# Patient Record
Sex: Male | Born: 1955 | Race: Black or African American | Hispanic: No | Marital: Married | Smoking: Never smoker
Health system: Southern US, Community
[De-identification: ages and names within clinical notes are randomized; demographics above are authoritative.]

## PROBLEM LIST (undated history)

## (undated) ENCOUNTER — Emergency Department (HOSPITAL_COMMUNITY): Admission: EM | Payer: Self-pay | Source: Home / Self Care

## (undated) DIAGNOSIS — A419 Sepsis, unspecified organism: Secondary | ICD-10-CM

## (undated) DIAGNOSIS — N390 Urinary tract infection, site not specified: Secondary | ICD-10-CM

## (undated) DIAGNOSIS — J9600 Acute respiratory failure, unspecified whether with hypoxia or hypercapnia: Secondary | ICD-10-CM

## (undated) DIAGNOSIS — E119 Type 2 diabetes mellitus without complications: Secondary | ICD-10-CM

## (undated) DIAGNOSIS — I639 Cerebral infarction, unspecified: Secondary | ICD-10-CM

## (undated) DIAGNOSIS — N179 Acute kidney failure, unspecified: Secondary | ICD-10-CM

## (undated) DIAGNOSIS — J189 Pneumonia, unspecified organism: Secondary | ICD-10-CM

## (undated) DIAGNOSIS — R6521 Severe sepsis with septic shock: Secondary | ICD-10-CM

## (undated) DIAGNOSIS — D638 Anemia in other chronic diseases classified elsewhere: Secondary | ICD-10-CM

## (undated) DIAGNOSIS — Z931 Gastrostomy status: Secondary | ICD-10-CM

## (undated) HISTORY — DX: Gastrostomy status: Z93.1

## (undated) HISTORY — DX: Urinary tract infection, site not specified: N39.0

## (undated) HISTORY — DX: Anemia in other chronic diseases classified elsewhere: D63.8

## (undated) HISTORY — PX: CHOLECYSTECTOMY: SHX55

## (undated) HISTORY — DX: Acute respiratory failure, unspecified whether with hypoxia or hypercapnia: J96.00

## (undated) HISTORY — DX: Sepsis, unspecified organism: A41.9

---

## 1998-06-21 ENCOUNTER — Ambulatory Visit (HOSPITAL_COMMUNITY): Admission: RE | Admit: 1998-06-21 | Discharge: 1998-06-21 | Payer: Self-pay | Admitting: *Deleted

## 1998-11-15 ENCOUNTER — Emergency Department (HOSPITAL_COMMUNITY): Admission: EM | Admit: 1998-11-15 | Discharge: 1998-11-15 | Payer: Self-pay | Admitting: Emergency Medicine

## 1999-04-23 ENCOUNTER — Encounter: Payer: Self-pay | Admitting: Sports Medicine

## 1999-04-23 ENCOUNTER — Ambulatory Visit (HOSPITAL_COMMUNITY): Admission: RE | Admit: 1999-04-23 | Discharge: 1999-04-23 | Payer: Self-pay | Admitting: Sports Medicine

## 1999-07-16 ENCOUNTER — Encounter: Payer: Self-pay | Admitting: Neurosurgery

## 1999-07-16 ENCOUNTER — Ambulatory Visit (HOSPITAL_COMMUNITY): Admission: RE | Admit: 1999-07-16 | Discharge: 1999-07-16 | Payer: Self-pay | Admitting: Neurosurgery

## 1999-07-31 ENCOUNTER — Ambulatory Visit (HOSPITAL_COMMUNITY): Admission: RE | Admit: 1999-07-31 | Discharge: 1999-07-31 | Payer: Self-pay | Admitting: Neurosurgery

## 1999-07-31 ENCOUNTER — Encounter: Payer: Self-pay | Admitting: Neurosurgery

## 1999-08-14 ENCOUNTER — Ambulatory Visit (HOSPITAL_COMMUNITY): Admission: RE | Admit: 1999-08-14 | Discharge: 1999-08-14 | Payer: Self-pay | Admitting: Neurosurgery

## 1999-08-14 ENCOUNTER — Encounter: Payer: Self-pay | Admitting: Neurosurgery

## 1999-09-12 ENCOUNTER — Ambulatory Visit (HOSPITAL_COMMUNITY): Admission: RE | Admit: 1999-09-12 | Discharge: 1999-09-12 | Payer: Self-pay | Admitting: *Deleted

## 1999-09-12 ENCOUNTER — Encounter (INDEPENDENT_AMBULATORY_CARE_PROVIDER_SITE_OTHER): Payer: Self-pay | Admitting: Specialist

## 1999-10-01 ENCOUNTER — Ambulatory Visit: Admission: RE | Admit: 1999-10-01 | Discharge: 1999-10-01 | Payer: Self-pay | Admitting: Otolaryngology

## 2001-12-08 ENCOUNTER — Encounter (INDEPENDENT_AMBULATORY_CARE_PROVIDER_SITE_OTHER): Payer: Self-pay | Admitting: Specialist

## 2001-12-08 ENCOUNTER — Ambulatory Visit (HOSPITAL_COMMUNITY): Admission: RE | Admit: 2001-12-08 | Discharge: 2001-12-08 | Payer: Self-pay | Admitting: *Deleted

## 2002-02-28 ENCOUNTER — Ambulatory Visit (HOSPITAL_COMMUNITY): Admission: RE | Admit: 2002-02-28 | Discharge: 2002-02-28 | Payer: Self-pay | Admitting: Endocrinology

## 2002-02-28 ENCOUNTER — Encounter: Payer: Self-pay | Admitting: Endocrinology

## 2002-04-08 ENCOUNTER — Emergency Department (HOSPITAL_COMMUNITY): Admission: EM | Admit: 2002-04-08 | Discharge: 2002-04-08 | Payer: Self-pay | Admitting: Emergency Medicine

## 2002-04-08 ENCOUNTER — Encounter: Payer: Self-pay | Admitting: Emergency Medicine

## 2002-06-05 ENCOUNTER — Encounter: Admission: RE | Admit: 2002-06-05 | Discharge: 2002-06-05 | Payer: Self-pay | Admitting: Sports Medicine

## 2002-06-05 ENCOUNTER — Encounter: Payer: Self-pay | Admitting: Sports Medicine

## 2002-06-13 ENCOUNTER — Encounter: Payer: Self-pay | Admitting: Surgery

## 2002-06-21 ENCOUNTER — Observation Stay (HOSPITAL_COMMUNITY): Admission: RE | Admit: 2002-06-21 | Discharge: 2002-06-22 | Payer: Self-pay | Admitting: Surgery

## 2002-06-21 ENCOUNTER — Encounter (INDEPENDENT_AMBULATORY_CARE_PROVIDER_SITE_OTHER): Payer: Self-pay | Admitting: Specialist

## 2003-02-22 ENCOUNTER — Ambulatory Visit (HOSPITAL_COMMUNITY): Admission: RE | Admit: 2003-02-22 | Discharge: 2003-02-22 | Payer: Self-pay | Admitting: *Deleted

## 2003-10-23 ENCOUNTER — Encounter: Admission: RE | Admit: 2003-10-23 | Discharge: 2004-01-21 | Payer: Self-pay | Admitting: Endocrinology

## 2004-03-11 ENCOUNTER — Encounter: Admission: RE | Admit: 2004-03-11 | Discharge: 2004-06-09 | Payer: Self-pay | Admitting: Endocrinology

## 2004-10-03 ENCOUNTER — Emergency Department (HOSPITAL_COMMUNITY): Admission: EM | Admit: 2004-10-03 | Discharge: 2004-10-03 | Payer: Self-pay | Admitting: Family Medicine

## 2005-04-27 ENCOUNTER — Ambulatory Visit (HOSPITAL_COMMUNITY): Admission: RE | Admit: 2005-04-27 | Discharge: 2005-04-27 | Payer: Self-pay | Admitting: *Deleted

## 2008-01-20 ENCOUNTER — Emergency Department (HOSPITAL_COMMUNITY): Admission: EM | Admit: 2008-01-20 | Discharge: 2008-01-20 | Payer: Self-pay | Admitting: Emergency Medicine

## 2009-01-23 ENCOUNTER — Ambulatory Visit (HOSPITAL_COMMUNITY): Admission: RE | Admit: 2009-01-23 | Discharge: 2009-01-23 | Payer: Self-pay | Admitting: *Deleted

## 2011-01-28 LAB — GLUCOSE, CAPILLARY: Glucose-Capillary: 184 mg/dL — ABNORMAL HIGH (ref 70–99)

## 2011-03-03 NOTE — Op Note (Signed)
NAMETHEON, SOBOTKA              ACCOUNT NO.:  [id number]   MEDICAL RECORD NO.:  [id number]          PATIENT TYPE:  AMB   LOCATION:  ENDO                         FACILITY:  [facility]   PHYSICIAN:  Georgiana Spinner, M.D.    DATE OF BIRTH:  1956/08/14   DATE OF PROCEDURE:  DATE OF DISCHARGE:                               OPERATIVE REPORT   PROCEDURE:  Colonoscopy.   INDICATIONS:  Hemoccult positivity.   ANESTHESIA:  Fentanyl 75 mcg, Versed 7.5 mg.   PROCEDURE:  With the patient mildly sedated in the left lateral  decubitus position, the Pentax videoscopic colonoscope was inserted in  the rectum after normal rectal exam and passed under direct vision with  pressure applied to reach the cecum, identified by the ileocecal valve  and the base of the cecum, both of which were photographed.  From this  point the colonoscope was slowly withdrawn, taking circumferential views  of the colonic mucosa, stopping only in the rectum, which appeared  normal on direct and showed hemorrhoids on retroflexed view.  The  endoscope was straightened and withdrawn.  The patient's vital signs,  pulse oximeter remained stable.  The patient tolerated the procedure  well without apparent complications.   FINDINGS:  A rather unremarkable examination with possibly small  internal hemorrhoids noted.   PLAN:  Will have patient follow up with me as an outpatient to determine  if further evaluation is necessary.           ______________________________  Georgiana Spinner, M.D.     GMO/MEDQ  D:  01/23/2009  T:  01/23/2009  Job:  [id number]

## 2011-03-06 NOTE — Op Note (Signed)
   Michael Bright, Michael Bright                          ACCOUNT NO.:  [id number]   MEDICAL RECORD NO.:  [id number]                   PATIENT TYPE:  AMB   LOCATION:  ENDO                                 FACILITY:  [facility]   PHYSICIAN:  Georgiana Spinner, M.D.                 DATE OF BIRTH:  07-31-1956   DATE OF PROCEDURE:  02/22/2003  DATE OF DISCHARGE:                                 OPERATIVE REPORT   PROCEDURE PERFORMED:  Colonoscopy.   ENDOSCOPIST:  Georgiana Spinner, M.D.   INDICATIONS FOR PROCEDURE:  Rectal bleeding.   ANESTHESIA:  Demerol 25 mg, Versed 2 mg, Phenergan 12.5 mg extra.   DESCRIPTION OF PROCEDURE:  With the patient mildly sedated in the left  lateral decubitus position, the Olympus video colonoscope was inserted in  the rectum and passed under direct vision to the cecum, identified by the  ileocecal valve and appendiceal orifice, both of which were photographed.  From this point the colonoscope was slowly withdrawn taking circumferential  views of the entire colonic mucosa stopping only in the rectum which  appeared normal on direct and retroflex view.  The endoscope was  straightened and withdrawn.  The patient's vital signs and pulse oximeter  remained stable.  The patient tolerated the procedure well without apparent  complications.   FINDINGS:  Unremarkable colonoscopic examination.   PLAN:  Have patient follow up with me as an outpatient.                                                 Georgiana Spinner, M.D.    GMO/MEDQ  D:  02/22/2003  T:  02/22/2003  Job:  [id number]

## 2011-03-06 NOTE — Op Note (Signed)
NAMECIRILO, CANNER              ACCOUNT NO.:  [id number]   MEDICAL RECORD NO.:  [id number]          PATIENT TYPE:  AMB   LOCATION:  ENDO                         FACILITY:  [facility]   PHYSICIAN:  Georgiana Spinner, M.D.    DATE OF BIRTH:  12-17-1955   DATE OF PROCEDURE:  DATE OF DISCHARGE:                                 OPERATIVE REPORT   PROCEDURE:  Colonoscopy.   ANESTHESIA:  Demerol 20, Versed 1 mg.   INDICATIONS:  Hemoccult positivity.   With patient mildly sedated in the left lateral decubitus position, the  Olympus videoscopic adjustable colonoscope was inserted in the rectum and  passed under direct vision rather easily to the cecum, identified by crow's  foot of the cecum and ileocecal valve, both of which were photographed.  From this point, the colonoscope was slowly withdrawn, taking  circumferential views of colonic mucosa, stopping in the rectum which  appeared normal on direct and retroflexed view.  Pull through of the anal  canal shows some excoriation of the peroneal mucosa, but was otherwise  unremarkable. The patient's vital signs and pulse oximeter remained stable.  The patient tolerated procedure well without apparent complications.   FINDINGS:  Mild excoriation of the peroneal mucosa.  Otherwise unremarkable  examination.   PLAN:  Have the patient follow-up with me as an outpatient.       GMO/MEDQ  D:  04/27/2005  T:  04/27/2005  Job:  [id number]

## 2011-03-06 NOTE — Procedures (Signed)
[facility]  Patient:    Michael Bright, Michael Bright Visit Number: [id number] MRN: [id number]          Service Type: Attending:  Sabino Gasser, M.D. Dictated by:   Sabino Gasser, M.D. Proc. Date: 12/08/01                             Procedure Report  PROCEDURE:  Upper endoscopy.  INDICATIONS:  Gastroesophageal reflux disease, Barretts esophagus previously noted.  ANESTHESIA:  Demerol 80, Versed 8 mg.  DESCRIPTION OF PROCEDURE:  With patient mildly sedated in the left lateral decubitus position, the Olympus videoscopic endoscope was inserted into the mouth and passed under direct vision through the esophagus.  The distal esophagus was approached and showed what appeared to be short segments of Barretts esophagus.  This was photographed and biopsied.  We entered into the stomach.  The  fundus, body, antrum, duodenal bulb, and second portion of duodenum were well-visualized.  From this point, the endoscope was slowly withdrawn, taking circumferential views of the entire duodenal mucosa until the endoscope then pulled back into the stomach and placed in retroflexion to view the stomach from below.  The endoscope was then straightened and withdrawn, taking circumferential views of the remaining gastric and esophageal mucosa, stopping in the antrum and of the stomach where diffuse erythema was seen, photographed and biopsied.  The endoscope was withdrawn. The patients vital signs and pulse oximeter remained stable.  The patient tolerated the procedure well without apparent complications.  FINDINGS: 1. Changes of probable Barretts esophagus, photographed and biopsied. 2. Changes of erythema in the stomach consistent with a possible gastritis.  PLAN:  Await biopsy report.  The patient will call me for results and follow up with me as an outpatient. Dictated by:   Sabino Gasser, M.D. Attending:  Sabino Gasser, M.D. DD:  12/08/01 TD:  12/08/01 Job: [id number]

## 2011-03-06 NOTE — Op Note (Signed)
Michael Bright, Michael Bright                         ACCOUNT NO.:  [id number]   MEDICAL RECORD NO.:  [id number]                   PATIENT TYPE:  OBV   LOCATION:  0159                                 FACILITY:  [facility]   PHYSICIAN:  Currie Paris, M.D.           DATE OF BIRTH:  August 11, 1956   DATE OF PROCEDURE:  06/21/2002  DATE OF DISCHARGE:                                 OPERATIVE REPORT   CCS#:  [id number]   PREOPERATIVE DIAGNOSES:  Chronic calculus cholecystitis.   POSTOPERATIVE DIAGNOSES:  Chronic calculus cholecystitis.   OPERATION:  Laparoscopic cholecystectomy.   SURGEON:  Currie Paris, M.D.   ASSISTANT:  Gita Kudo, M.D.   ANESTHESIA:  General endotracheal.   CLINICAL HISTORY:  This patient is a 55 year old morbidly obese gentleman  who is otherwise in reasonably good health who has what appears to be  multiple small tiny gallstones and biliary type symptoms. After discussion  with this patient, he elected to proceed to cholecystectomy.   DESCRIPTION OF PROCEDURE:  The patient was seen in the holding area and had  no further questions about his surgery. He was taken to the operating room  and after satisfactory general endotracheal anesthesia had been obtained,  the abdomen was prepped and draped. 0.25% plain Marcaine was used for each  incision. An umbilical incision was made just above the umbilicus and the  fascia identified, opened and the peritoneal cavity entered under direct  vision. A pursestring suture was placed and the Hasson introduced. The  patient was placed in reverse Trendelenburg and tilted a little to the left  and two additional cannulas placed in the usual position. The gallbladder  was tensely distended, we could not really grab it so it was aspirated with  a needle aspirator and decompressed. I put some clips on the opening to try  to keep bile from leaking during the remainder of the case but we did have  some leakage initially which we  were able to suction out.   I had to use a 30 degree scope because the patient's morbid obesity made  visualization around the triangle of Calot difficult but I was able to  identify the peritoneum in the triangle of Calot and the lymph node that is  usually associated with the cystic duct and cystic artery. I opened the  peritoneum on both sides of the triangle of Calot and was able to dissect  out and identify a window and dissect out so I could see the posterior  branch of the artery which was small and clipped and divided. I had the  cystic artery and cystic duct well dissected out but the cystic duct I could  not really get good traction on and I was afraid to do a cholangiogram for  fear that if we divided it would retract into an area where I would not be  able to see it. Since I had the  anatomy clearly identified and since his  liver function is normal preoperatively, I elected not to do a  cholangiogram.   I put three clips on the stay side and one on the go side of both the cystic  artery and cystic duct and divided each. The gallbladder was removed from  below to above and additional small clip placed on some fatty tissue but  that kind of looked like it might have a little lymphatic in it. The  gallbladder was removed from below to above and just prior to disconnecting,  we irrigated and made sure everything was dry. The gallbladder was  disconnected and put in a bad. Final irrigation and check for hemostasis was  made and again everything appeared okay.   The gallbladder was brought out the umbilical port. The abdomen was  reinsufflated and we suctioned out the remaining irrigant and removed the  lateral ports. They appeared to be dry. The umbilical port was removed and  the pursestring tied down. The abdomen was deflated through the epigastric  port and it was removed. The skin was closed with staples as my usual  Monocryl suture did not get good approximation because of a  fair amount of  skin tension from his obesity.   The patient tolerated the procedure well and there were no operative  complications. All counts were correct.                                               Currie Paris, M.D.    CJS/MEDQ  D:  06/21/2002  T:  06/21/2002  Job:  [id number]   cc:   Brooke Bonito, M.D.  [address]  [place]  [place] [address]  Fax: [phone]

## 2011-03-06 NOTE — Op Note (Signed)
Michael Bright, Michael Bright              ACCOUNT NO.:  [id number]   MEDICAL RECORD NO.:  [id number]          PATIENT TYPE:  AMB   LOCATION:  ENDO                         FACILITY:  [facility]   PHYSICIAN:  Georgiana Spinner, M.D.    DATE OF BIRTH:  [birth year]   DATE OF PROCEDURE:  04/27/2005  DATE OF DISCHARGE:                                 OPERATIVE REPORT   PROCEDURE:  Upper endoscopy.   INDICATIONS:  Hemoccult positivity.   ANESTHESIA:  1.  Demerol 60.  2.  Versed 5 mg.   PROCEDURE:  With patient mildly sedated in the left lateral decubitus  position, the Olympus videoscopic endoscope was inserted in the mouth,  passed under direct vision through the esophagus, which appeared normal,  into the stomach, fundus, body and antrum.  The duodenal bulb, second  portion of duodenum all appeared normal.  From this point, the endoscope was  slowly withdrawn, taking circumferential views of duodenal mucosa until the  endoscope had been pulled back and the stomach placed in retroflexion to  view the stomach from below.  The endoscope was straightened and withdrawn,  taking circumferential views of the remaining gastric and esophageal mucosa.  The patient's vital signs, pulse oximeter remained stable.  The patient  tolerated the procedure well with no apparent complications.   FINDINGS:  Unremarkable examination.  Proceed to colonoscopy.       GMO/MEDQ  D:  04/27/2005  T:  04/27/2005  Job:  [id number]

## 2011-03-06 NOTE — Op Note (Signed)
   Michael Bright, Michael Bright                          ACCOUNT NO.:  [id number]   MEDICAL RECORD NO.:  [id number]                   PATIENT TYPE:  AMB   LOCATION:  ENDO                                 FACILITY:  [facility]   PHYSICIAN:  Georgiana Spinner, M.D.                 DATE OF BIRTH:  [birth year]   DATE OF PROCEDURE:  02/22/2003  DATE OF DISCHARGE:                                 OPERATIVE REPORT   PROCEDURE PERFORMED:  Upper endoscopy.   ENDOSCOPIST:  Georgiana Spinner, M.D.   INDICATIONS FOR PROCEDURE:  Gastroesophageal reflux disease.  Question  history of Barrett's esophagus.   ANESTHESIA:  Demerol 75 mg, Versed 8 mg, Phenergan 12.5 mg.   DESCRIPTION OF PROCEDURE:  With the patient mildly sedated in the left  lateral decubitus position, the Olympus video endoscope was inserted in the  mouth and passed under direct vision through the esophagus.  The distal  esophagus was approached and I saw no evidence of Barrett's esophagus.  We  entered into the stomach.  The fundus, body, antrum, duodenal bulb and  second portion of the duodenum all appeared normal.  From this point, the  endoscope was slowly withdrawn taking circumferential views of the duodenal  mucosa until the endoscope was pulled back into the stomach and placed on  retroflexion to view the stomach from below.  The endoscope was then  straightened and withdrawn taking circumferential views of the remaining  gastric and esophageal mucosa.  The patient's vital signs and pulse oximeter  remained stable.  The patient tolerated the procedure well without apparent  complications.   FINDINGS:  Otherwise unremarkable examination other than an incomplete wrap  of the gastroesophageal junction around endoscope.   PLAN:  Repeat examination in possibly one year and look once again for  Barrett's.  Proceed to colonoscopy as planned.                                               Georgiana Spinner, M.D.    GMO/MEDQ  D:  02/22/2003  T:   02/22/2003  Job:  [id number]

## 2011-07-14 LAB — POCT URINALYSIS DIP (DEVICE)
Bilirubin Urine: NEGATIVE
Glucose, UA: >=1000 — AB
Ketones, ur: 40 — AB
Nitrite: NEGATIVE
Operator id: 116391
Protein, ur: 100 — AB
Specific Gravity, Urine: 1.025
Urobilinogen, UA: 0.2
pH: 5.5

## 2011-07-14 LAB — HERPES SIMPLEX VIRUS CULTURE

## 2011-07-14 LAB — GC/CHLAMYDIA PROBE AMP, GENITAL
Chlamydia, DNA Probe: NEGATIVE
GC Probe Amp, Genital: NEGATIVE

## 2011-07-14 LAB — POCT I-STAT, CHEM 8
BUN: 12
Calcium, Ion: 1.09 — ABNORMAL LOW
Chloride: 106
Creatinine, Ser: 1.1
Glucose, Bld: 267 — ABNORMAL HIGH
HCT: 49
Hemoglobin: 16.7
Potassium: 4.1
Sodium: 137
TCO2: 21

## 2014-03-22 ENCOUNTER — Encounter (HOSPITAL_COMMUNITY): Payer: Self-pay | Admitting: Emergency Medicine

## 2014-03-22 ENCOUNTER — Emergency Department (HOSPITAL_COMMUNITY)
Admission: EM | Admit: 2014-03-22 | Discharge: 2014-03-22 | Disposition: A | Discharge status: Home / Self Care | Payer: BC Managed Care – PPO | Source: Home / Self Care | Attending: Emergency Medicine | Admitting: Emergency Medicine

## 2014-03-22 DIAGNOSIS — M79606 Pain in leg, unspecified: Secondary | ICD-10-CM

## 2014-03-22 DIAGNOSIS — M79609 Pain in unspecified limb: Secondary | ICD-10-CM

## 2014-03-22 MED ORDER — CYCLOBENZAPRINE HCL 10 MG PO TABS: 10.0000 mg | ORAL_TABLET | Freq: Three times a day (TID) | ORAL | Status: DC | PRN

## 2014-03-22 MED ORDER — ETODOLAC 500 MG PO TABS: 500.0000 mg | ORAL_TABLET | Freq: Two times a day (BID) | ORAL | Status: DC

## 2014-03-22 NOTE — ED Notes (Signed)
Pt reports making sudden movement when sitting up in bed and felt pain in lower back and down through out left leg.  Incident happened a week ago.  States no getting any better.  No relief with ibuprofen or heating pad

## 2014-03-22 NOTE — ED Provider Notes (Signed)
CSN: [id number]     Arrival date & time 03/22/14  1433 History   None    Chief Complaint  Patient presents with  . Leg Pain   (Consider location/radiation/quality/duration/timing/severity/associated sxs/prior Treatment) HPI Comments: 58 year old male presents complaining of left leg pain from an injury occurring one week ago. He was walking and turned too quickly and felt a pull in the side of his leg. He has pain in the lower lateral calf that radiates up the leg to the side of his hip. He has also had a sensation of numbness in his first and second toes. He says he feels a lump on the side of his left leg or he may have pulled a muscle. He tried putting heat on it but that only made it worse. He has ibuprofen and that has not helped either. The sensation of numbness gets worse when he lays down flat and is relieved by moving his leg. He denies any swelling of the leg or redness. There is no pain at rest. He has no history of DVT or PE. He has no systemic symptoms. He has a history of bulging disc causing pain in the opposite leg, but this feels nothing like that. The pain from the bulging disc was much worse.  Patient is a 58 y.o. male presenting with leg pain.  Leg Pain Associated symptoms: no fever     History reviewed. No pertinent past medical history. History reviewed. No pertinent past surgical history. History reviewed. No pertinent family history. History  Substance Use Topics  . Smoking status: Never Smoker   . Smokeless tobacco: Not on file  . Alcohol Use: Yes    Review of Systems  Constitutional: Negative for fever.  Cardiovascular: Negative for leg swelling.  Musculoskeletal:       Left leg pain  All other systems reviewed and are negative.   Allergies  Review of patient's allergies indicates no known allergies.  Home Medications   Prior to Admission medications   Medication Sig Start Date End Date Taking? Authorizing Provider  cyclobenzaprine (FLEXERIL) 10 MG  tablet Take 1 tablet (10 mg total) by mouth 3 (three) times daily as needed for muscle spasms. 03/22/14   Graylon Good, PA-C  etodolac (LODINE) 500 MG tablet Take 1 tablet (500 mg total) by mouth 2 (two) times daily. 03/22/14   Adrian Blackwater Keshun Berrett, PA-C   BP 147/71  Pulse 85  Temp(Src) 98.2 F (36.8 C) (Oral)  Resp 16  SpO2 96% Physical Exam  Nursing note and vitals reviewed. Constitutional: He is oriented to person, place, and time. He appears well-developed and well-nourished. No distress.  Morbidly obese body habitus  HENT:  Head: Normocephalic.  Cardiovascular:  Pulses:      Dorsalis pedis pulses are 2+ on the right side, and 2+ on the left side.  There is trace pitting edema up to the knees that is equal bilaterally  Pulmonary/Chest: Effort normal. No respiratory distress.  Musculoskeletal:       Left knee: Normal.       Left ankle: Normal.       Left upper leg: Normal.       Left lower leg: Normal.       Left foot: Normal.  Neurological: He is alert and oriented to person, place, and time. He has normal strength. No sensory deficit. He exhibits normal muscle tone. Coordination and gait normal.  Babinski's is downgoing  Skin: Skin is warm and dry. No rash noted. He is  not diaphoretic.  Psychiatric: He has a normal mood and affect. Judgment normal.    ED Course  Procedures (including critical care time) Labs Review Labs Reviewed - No data to display  Imaging Review No results found.   MDM   1. Leg pain    The exam is entirely normal. Likely a muscle strain. There is no unilateral edema, swelling, or redness to suggest DVT. There is no redness or swelling to suggest cellulitis. Sensation is equal bilaterally and completely intact. DTRs are equal bilaterally and normal. We'll treat with NSAIDs and with muscle relaxer. Followup as needed.   Meds ordered this encounter  Medications  . cyclobenzaprine (FLEXERIL) 10 MG tablet    Sig: Take 1 tablet (10 mg total) by mouth 3  (three) times daily as needed for muscle spasms.    Dispense:  20 tablet    Refill:  0    Order Specific Question:  Supervising Provider    Answer:  Linna HoffKINDL, JAMES D [phone][[id number]]  . etodolac (LODINE) 500 MG tablet    Sig: Take 1 tablet (500 mg total) by mouth 2 (two) times daily.    Dispense:  30 tablet    Refill:  1    Order Specific Question:  Supervising Provider    Answer:  Bradd CanaryKINDL, JAMES D [[id number]]       Graylon GoodZachary H Khamila Bassinger, PA-C 03/22/14 [phone]

## 2014-03-22 NOTE — Discharge Instructions (Signed)
Musculoskeletal Pain °Musculoskeletal pain is muscle and boney aches and pains. These pains can occur in any part of the body. Your caregiver may treat you without knowing the cause of the pain. They may treat you if blood or urine tests, X-rays, and other tests were normal.  °CAUSES °There is often not a definite cause or reason for these pains. These pains may be caused by a type of germ (virus). The discomfort may also come from overuse. Overuse includes working out too hard when your body is not fit. Boney aches also come from weather changes. Bone is sensitive to atmospheric pressure changes. °HOME CARE INSTRUCTIONS  °· Ask when your test results will be ready. Make sure you get your test results. °· Only take over-the-counter or prescription medicines for pain, discomfort, or fever as directed by your caregiver. If you were given medications for your condition, do not drive, operate machinery or power tools, or sign legal documents for 24 hours. Do not drink alcohol. Do not take sleeping pills or other medications that may interfere with treatment. °· Continue all activities unless the activities cause more pain. When the pain lessens, slowly resume normal activities. Gradually increase the intensity and duration of the activities or exercise. °· During periods of severe pain, bed rest may be helpful. Lay or sit in any position that is comfortable. °· Putting ice on the injured area. °· Put ice in a bag. °· Place a towel between your skin and the bag. °· Leave the ice on for 15 to 20 minutes, 3 to 4 times a day. °· Follow up with your caregiver for continued problems and no reason can be found for the pain. If the pain becomes worse or does not go away, it may be necessary to repeat tests or do additional testing. Your caregiver may need to look further for a possible cause. °SEEK IMMEDIATE MEDICAL CARE IF: °· You have pain that is getting worse and is not relieved by medications. °· You develop chest pain  that is associated with shortness or breath, sweating, feeling sick to your stomach (nauseous), or throw up (vomit). °· Your pain becomes localized to the abdomen. °· You develop any new symptoms that seem different or that concern you. °MAKE SURE YOU:  °· Understand these instructions. °· Will watch your condition. °· Will get help right away if you are not doing well or get worse. °Document Released: 10/05/2005 Document Revised: 12/28/2011 Document Reviewed: 06/09/2013 °[employer]® Patient Information ©2014 [employer]. ° °

## 2014-03-23 NOTE — ED Provider Notes (Signed)
Medical screening examination/treatment/procedure(s) were performed by non-physician practitioner and as supervising physician I was immediately available for consultation/collaboration.  Delena Casebeer, M.D.  Mistee Soliman C Lafern Brinkley, MD 03/23/14 2217 

## 2014-11-16 ENCOUNTER — Encounter: Payer: Self-pay | Admitting: Internal Medicine

## 2019-05-29 ENCOUNTER — Encounter (HOSPITAL_COMMUNITY): Payer: Self-pay | Admitting: Emergency Medicine

## 2019-05-29 ENCOUNTER — Other Ambulatory Visit: Payer: Self-pay

## 2019-05-29 DIAGNOSIS — R29704 NIHSS score 4: Secondary | ICD-10-CM | POA: Diagnosis present

## 2019-05-29 DIAGNOSIS — R29810 Facial weakness: Secondary | ICD-10-CM | POA: Diagnosis present

## 2019-05-29 DIAGNOSIS — I6381 Other cerebral infarction due to occlusion or stenosis of small artery: Secondary | ICD-10-CM | POA: Diagnosis not present

## 2019-05-29 DIAGNOSIS — R443 Hallucinations, unspecified: Secondary | ICD-10-CM | POA: Diagnosis not present

## 2019-05-29 DIAGNOSIS — H532 Diplopia: Secondary | ICD-10-CM | POA: Diagnosis not present

## 2019-05-29 DIAGNOSIS — G9349 Other encephalopathy: Secondary | ICD-10-CM | POA: Diagnosis not present

## 2019-05-29 DIAGNOSIS — G4733 Obstructive sleep apnea (adult) (pediatric): Secondary | ICD-10-CM | POA: Diagnosis present

## 2019-05-29 DIAGNOSIS — H5500 Unspecified nystagmus: Secondary | ICD-10-CM | POA: Diagnosis present

## 2019-05-29 DIAGNOSIS — I129 Hypertensive chronic kidney disease with stage 1 through stage 4 chronic kidney disease, or unspecified chronic kidney disease: Secondary | ICD-10-CM | POA: Diagnosis present

## 2019-05-29 DIAGNOSIS — Z20828 Contact with and (suspected) exposure to other viral communicable diseases: Secondary | ICD-10-CM | POA: Diagnosis present

## 2019-05-29 DIAGNOSIS — F329 Major depressive disorder, single episode, unspecified: Secondary | ICD-10-CM | POA: Diagnosis present

## 2019-05-29 DIAGNOSIS — J9621 Acute and chronic respiratory failure with hypoxia: Secondary | ICD-10-CM | POA: Diagnosis not present

## 2019-05-29 DIAGNOSIS — G464 Cerebellar stroke syndrome: Secondary | ICD-10-CM | POA: Diagnosis present

## 2019-05-29 DIAGNOSIS — E876 Hypokalemia: Secondary | ICD-10-CM | POA: Diagnosis not present

## 2019-05-29 DIAGNOSIS — R339 Retention of urine, unspecified: Secondary | ICD-10-CM | POA: Diagnosis present

## 2019-05-29 DIAGNOSIS — E1165 Type 2 diabetes mellitus with hyperglycemia: Secondary | ICD-10-CM | POA: Diagnosis present

## 2019-05-29 DIAGNOSIS — R471 Dysarthria and anarthria: Secondary | ICD-10-CM | POA: Diagnosis present

## 2019-05-29 DIAGNOSIS — N183 Chronic kidney disease, stage 3 (moderate): Secondary | ICD-10-CM | POA: Diagnosis present

## 2019-05-29 DIAGNOSIS — G463 Brain stem stroke syndrome: Secondary | ICD-10-CM | POA: Diagnosis present

## 2019-05-29 DIAGNOSIS — J690 Pneumonitis due to inhalation of food and vomit: Secondary | ICD-10-CM | POA: Diagnosis not present

## 2019-05-29 DIAGNOSIS — E11649 Type 2 diabetes mellitus with hypoglycemia without coma: Secondary | ICD-10-CM | POA: Diagnosis not present

## 2019-05-29 DIAGNOSIS — Z6841 Body Mass Index (BMI) 40.0 and over, adult: Secondary | ICD-10-CM

## 2019-05-29 DIAGNOSIS — Z23 Encounter for immunization: Secondary | ICD-10-CM

## 2019-05-29 DIAGNOSIS — R1310 Dysphagia, unspecified: Secondary | ICD-10-CM | POA: Diagnosis present

## 2019-05-29 DIAGNOSIS — E785 Hyperlipidemia, unspecified: Secondary | ICD-10-CM | POA: Diagnosis present

## 2019-05-29 DIAGNOSIS — E1122 Type 2 diabetes mellitus with diabetic chronic kidney disease: Secondary | ICD-10-CM | POA: Diagnosis present

## 2019-05-29 DIAGNOSIS — Z9119 Patient's noncompliance with other medical treatment and regimen: Secondary | ICD-10-CM

## 2019-05-29 DIAGNOSIS — J9811 Atelectasis: Secondary | ICD-10-CM | POA: Diagnosis not present

## 2019-05-29 DIAGNOSIS — J15211 Pneumonia due to Methicillin susceptible Staphylococcus aureus: Secondary | ICD-10-CM | POA: Diagnosis not present

## 2019-05-29 DIAGNOSIS — E1169 Type 2 diabetes mellitus with other specified complication: Secondary | ICD-10-CM | POA: Diagnosis present

## 2019-05-29 NOTE — ED Triage Notes (Signed)
Pt reports yesterday having left leg giving out and weakness, pt reports left foot feels a sleep. Reports that has back issues and nerve is pressed on.

## 2019-05-30 ENCOUNTER — Inpatient Hospital Stay (HOSPITAL_COMMUNITY): Payer: BC Managed Care – PPO

## 2019-05-30 ENCOUNTER — Encounter (HOSPITAL_COMMUNITY): Payer: Self-pay | Admitting: Radiology

## 2019-05-30 ENCOUNTER — Inpatient Hospital Stay (HOSPITAL_COMMUNITY)
Admission: EM | Admit: 2019-05-30 | Discharge: 2019-06-27 | DRG: 004 | Disposition: A | Discharge status: Rehabilitation Facility | Payer: BC Managed Care – PPO | Attending: Internal Medicine | Admitting: Internal Medicine

## 2019-05-30 ENCOUNTER — Emergency Department (HOSPITAL_COMMUNITY): Payer: BC Managed Care – PPO

## 2019-05-30 DIAGNOSIS — T85598A Other mechanical complication of other gastrointestinal prosthetic devices, implants and grafts, initial encounter: Secondary | ICD-10-CM

## 2019-05-30 DIAGNOSIS — R278 Other lack of coordination: Secondary | ICD-10-CM | POA: Diagnosis not present

## 2019-05-30 DIAGNOSIS — H532 Diplopia: Secondary | ICD-10-CM | POA: Diagnosis present

## 2019-05-30 DIAGNOSIS — E785 Hyperlipidemia, unspecified: Secondary | ICD-10-CM | POA: Diagnosis present

## 2019-05-30 DIAGNOSIS — Z20828 Contact with and (suspected) exposure to other viral communicable diseases: Secondary | ICD-10-CM | POA: Diagnosis present

## 2019-05-30 DIAGNOSIS — R0600 Dyspnea, unspecified: Secondary | ICD-10-CM

## 2019-05-30 DIAGNOSIS — Z978 Presence of other specified devices: Secondary | ICD-10-CM

## 2019-05-30 DIAGNOSIS — R471 Dysarthria and anarthria: Secondary | ICD-10-CM | POA: Diagnosis present

## 2019-05-30 DIAGNOSIS — G9349 Other encephalopathy: Secondary | ICD-10-CM | POA: Diagnosis not present

## 2019-05-30 DIAGNOSIS — I639 Cerebral infarction, unspecified: Secondary | ICD-10-CM | POA: Diagnosis present

## 2019-05-30 DIAGNOSIS — E669 Obesity, unspecified: Secondary | ICD-10-CM | POA: Diagnosis not present

## 2019-05-30 DIAGNOSIS — E876 Hypokalemia: Secondary | ICD-10-CM | POA: Diagnosis not present

## 2019-05-30 DIAGNOSIS — R05 Cough: Secondary | ICD-10-CM

## 2019-05-30 DIAGNOSIS — J9600 Acute respiratory failure, unspecified whether with hypoxia or hypercapnia: Secondary | ICD-10-CM

## 2019-05-30 DIAGNOSIS — N19 Unspecified kidney failure: Secondary | ICD-10-CM

## 2019-05-30 DIAGNOSIS — J15211 Pneumonia due to Methicillin susceptible Staphylococcus aureus: Secondary | ICD-10-CM | POA: Diagnosis not present

## 2019-05-30 DIAGNOSIS — R0603 Acute respiratory distress: Secondary | ICD-10-CM

## 2019-05-30 DIAGNOSIS — R1313 Dysphagia, pharyngeal phase: Secondary | ICD-10-CM | POA: Diagnosis not present

## 2019-05-30 DIAGNOSIS — G4733 Obstructive sleep apnea (adult) (pediatric): Secondary | ICD-10-CM | POA: Diagnosis not present

## 2019-05-30 DIAGNOSIS — J690 Pneumonitis due to inhalation of food and vomit: Secondary | ICD-10-CM | POA: Diagnosis not present

## 2019-05-30 DIAGNOSIS — R1310 Dysphagia, unspecified: Secondary | ICD-10-CM

## 2019-05-30 DIAGNOSIS — J9601 Acute respiratory failure with hypoxia: Secondary | ICD-10-CM

## 2019-05-30 DIAGNOSIS — R4189 Other symptoms and signs involving cognitive functions and awareness: Secondary | ICD-10-CM | POA: Diagnosis not present

## 2019-05-30 DIAGNOSIS — R5381 Other malaise: Secondary | ICD-10-CM | POA: Insufficient documentation

## 2019-05-30 DIAGNOSIS — E1322 Other specified diabetes mellitus with diabetic chronic kidney disease: Secondary | ICD-10-CM | POA: Diagnosis not present

## 2019-05-30 DIAGNOSIS — G464 Cerebellar stroke syndrome: Secondary | ICD-10-CM | POA: Diagnosis present

## 2019-05-30 DIAGNOSIS — E1159 Type 2 diabetes mellitus with other circulatory complications: Secondary | ICD-10-CM | POA: Diagnosis not present

## 2019-05-30 DIAGNOSIS — R1312 Dysphagia, oropharyngeal phase: Secondary | ICD-10-CM | POA: Diagnosis not present

## 2019-05-30 DIAGNOSIS — Z430 Encounter for attention to tracheostomy: Secondary | ICD-10-CM | POA: Diagnosis not present

## 2019-05-30 DIAGNOSIS — Z9049 Acquired absence of other specified parts of digestive tract: Secondary | ICD-10-CM | POA: Diagnosis not present

## 2019-05-30 DIAGNOSIS — Z930 Tracheostomy status: Secondary | ICD-10-CM

## 2019-05-30 DIAGNOSIS — N289 Disorder of kidney and ureter, unspecified: Secondary | ICD-10-CM | POA: Insufficient documentation

## 2019-05-30 DIAGNOSIS — R0902 Hypoxemia: Secondary | ICD-10-CM | POA: Diagnosis not present

## 2019-05-30 DIAGNOSIS — IMO0002 Reserved for concepts with insufficient information to code with codable children: Secondary | ICD-10-CM

## 2019-05-30 DIAGNOSIS — Z6841 Body Mass Index (BMI) 40.0 and over, adult: Secondary | ICD-10-CM | POA: Diagnosis not present

## 2019-05-30 DIAGNOSIS — R443 Hallucinations, unspecified: Secondary | ICD-10-CM | POA: Diagnosis not present

## 2019-05-30 DIAGNOSIS — E1165 Type 2 diabetes mellitus with hyperglycemia: Secondary | ICD-10-CM | POA: Diagnosis present

## 2019-05-30 DIAGNOSIS — J189 Pneumonia, unspecified organism: Secondary | ICD-10-CM | POA: Diagnosis not present

## 2019-05-30 DIAGNOSIS — E1169 Type 2 diabetes mellitus with other specified complication: Secondary | ICD-10-CM

## 2019-05-30 DIAGNOSIS — R29704 NIHSS score 4: Secondary | ICD-10-CM | POA: Diagnosis present

## 2019-05-30 DIAGNOSIS — R531 Weakness: Secondary | ICD-10-CM | POA: Diagnosis not present

## 2019-05-30 DIAGNOSIS — Z931 Gastrostomy status: Secondary | ICD-10-CM | POA: Diagnosis not present

## 2019-05-30 DIAGNOSIS — I69393 Ataxia following cerebral infarction: Secondary | ICD-10-CM | POA: Diagnosis not present

## 2019-05-30 DIAGNOSIS — J9690 Respiratory failure, unspecified, unspecified whether with hypoxia or hypercapnia: Secondary | ICD-10-CM

## 2019-05-30 DIAGNOSIS — I6389 Other cerebral infarction: Secondary | ICD-10-CM

## 2019-05-30 DIAGNOSIS — J9611 Chronic respiratory failure with hypoxia: Secondary | ICD-10-CM | POA: Diagnosis not present

## 2019-05-30 DIAGNOSIS — N183 Chronic kidney disease, stage 3 (moderate): Secondary | ICD-10-CM | POA: Diagnosis present

## 2019-05-30 DIAGNOSIS — D62 Acute posthemorrhagic anemia: Secondary | ICD-10-CM | POA: Diagnosis not present

## 2019-05-30 DIAGNOSIS — Z23 Encounter for immunization: Secondary | ICD-10-CM | POA: Diagnosis not present

## 2019-05-30 DIAGNOSIS — J9811 Atelectasis: Secondary | ICD-10-CM | POA: Diagnosis not present

## 2019-05-30 DIAGNOSIS — I10 Essential (primary) hypertension: Secondary | ICD-10-CM | POA: Diagnosis not present

## 2019-05-30 DIAGNOSIS — I6302 Cerebral infarction due to thrombosis of basilar artery: Secondary | ICD-10-CM | POA: Diagnosis not present

## 2019-05-30 DIAGNOSIS — E1365 Other specified diabetes mellitus with hyperglycemia: Secondary | ICD-10-CM | POA: Diagnosis not present

## 2019-05-30 DIAGNOSIS — G463 Brain stem stroke syndrome: Secondary | ICD-10-CM | POA: Diagnosis present

## 2019-05-30 DIAGNOSIS — R059 Cough, unspecified: Secondary | ICD-10-CM

## 2019-05-30 DIAGNOSIS — J40 Bronchitis, not specified as acute or chronic: Secondary | ICD-10-CM | POA: Diagnosis not present

## 2019-05-30 DIAGNOSIS — R509 Fever, unspecified: Secondary | ICD-10-CM

## 2019-05-30 DIAGNOSIS — Z4659 Encounter for fitting and adjustment of other gastrointestinal appliance and device: Secondary | ICD-10-CM

## 2019-05-30 DIAGNOSIS — E1122 Type 2 diabetes mellitus with diabetic chronic kidney disease: Secondary | ICD-10-CM | POA: Diagnosis present

## 2019-05-30 DIAGNOSIS — J14 Pneumonia due to Hemophilus influenzae: Secondary | ICD-10-CM | POA: Diagnosis not present

## 2019-05-30 DIAGNOSIS — R29810 Facial weakness: Secondary | ICD-10-CM | POA: Diagnosis present

## 2019-05-30 DIAGNOSIS — E119 Type 2 diabetes mellitus without complications: Secondary | ICD-10-CM | POA: Diagnosis not present

## 2019-05-30 DIAGNOSIS — Y95 Nosocomial condition: Secondary | ICD-10-CM | POA: Diagnosis not present

## 2019-05-30 DIAGNOSIS — H5500 Unspecified nystagmus: Secondary | ICD-10-CM | POA: Diagnosis present

## 2019-05-30 DIAGNOSIS — R1110 Vomiting, unspecified: Secondary | ICD-10-CM

## 2019-05-30 DIAGNOSIS — R5382 Chronic fatigue, unspecified: Secondary | ICD-10-CM | POA: Insufficient documentation

## 2019-05-30 DIAGNOSIS — R069 Unspecified abnormalities of breathing: Secondary | ICD-10-CM

## 2019-05-30 DIAGNOSIS — I6381 Other cerebral infarction due to occlusion or stenosis of small artery: Secondary | ICD-10-CM | POA: Diagnosis present

## 2019-05-30 DIAGNOSIS — I69391 Dysphagia following cerebral infarction: Secondary | ICD-10-CM | POA: Diagnosis not present

## 2019-05-30 DIAGNOSIS — J9691 Respiratory failure, unspecified with hypoxia: Secondary | ICD-10-CM | POA: Diagnosis not present

## 2019-05-30 DIAGNOSIS — J9621 Acute and chronic respiratory failure with hypoxia: Secondary | ICD-10-CM | POA: Diagnosis not present

## 2019-05-30 HISTORY — DX: Type 2 diabetes mellitus without complications: E11.9

## 2019-05-30 HISTORY — DX: Cerebral infarction, unspecified: I63.9

## 2019-05-30 HISTORY — DX: Disorder of kidney and ureter, unspecified: N28.9

## 2019-05-30 LAB — COMPREHENSIVE METABOLIC PANEL
ALT: 30 U/L (ref 0–44)
AST: 29 U/L (ref 15–41)
Albumin: 4.2 g/dL (ref 3.5–5.0)
Alkaline Phosphatase: 77 U/L (ref 38–126)
Anion gap: 16 — ABNORMAL HIGH (ref 5–15)
BUN: 18 mg/dL (ref 8–23)
CO2: 21 mmol/L — ABNORMAL LOW (ref 22–32)
Calcium: 9.1 mg/dL (ref 8.9–10.3)
Chloride: 95 mmol/L — ABNORMAL LOW (ref 98–111)
Creatinine, Ser: 1.56 mg/dL — ABNORMAL HIGH (ref 0.61–1.24)
GFR calc Af Amer: 55 mL/min — ABNORMAL LOW (ref >60)
GFR calc non Af Amer: 47 mL/min — ABNORMAL LOW (ref >60)
Glucose, Bld: 334 mg/dL — ABNORMAL HIGH (ref 70–99)
Potassium: 5 mmol/L (ref 3.5–5.1)
Sodium: 132 mmol/L — ABNORMAL LOW (ref 135–145)
Total Bilirubin: 1.2 mg/dL (ref 0.3–1.2)
Total Protein: 8.5 g/dL — ABNORMAL HIGH (ref 6.5–8.1)

## 2019-05-30 LAB — URINALYSIS, ROUTINE W REFLEX MICROSCOPIC
Bilirubin Urine: NEGATIVE
Glucose, UA: >=500 mg/dL — AB
Ketones, ur: 5 mg/dL — AB
Leukocytes,Ua: NEGATIVE
Nitrite: NEGATIVE
Protein, ur: 30 mg/dL — AB
Specific Gravity, Urine: 1.023 (ref 1.005–1.030)
pH: 5 (ref 5.0–8.0)

## 2019-05-30 LAB — CBC
HCT: 49.4 % (ref 39.0–52.0)
Hemoglobin: 16.2 g/dL (ref 13.0–17.0)
MCH: 30.1 pg (ref 26.0–34.0)
MCHC: 32.8 g/dL (ref 30.0–36.0)
MCV: 91.7 fL (ref 80.0–100.0)
Platelets: 240 10*3/uL (ref 150–400)
RBC: 5.39 MIL/uL (ref 4.22–5.81)
RDW: 12.2 % (ref 11.5–15.5)
WBC: 8.9 10*3/uL (ref 4.0–10.5)
nRBC: 0 % (ref 0.0–0.2)

## 2019-05-30 LAB — DIFFERENTIAL
Abs Immature Granulocytes: 0.03 10*3/uL (ref 0.00–0.07)
Basophils Absolute: 0 10*3/uL (ref 0.0–0.1)
Basophils Relative: 0 %
Eosinophils Absolute: 0.3 10*3/uL (ref 0.0–0.5)
Eosinophils Relative: 3 %
Immature Granulocytes: 0 %
Lymphocytes Relative: 21 %
Lymphs Abs: 1.8 10*3/uL (ref 0.7–4.0)
Monocytes Absolute: 0.5 10*3/uL (ref 0.1–1.0)
Monocytes Relative: 6 %
Neutro Abs: 6.2 10*3/uL (ref 1.7–7.7)
Neutrophils Relative %: 70 %

## 2019-05-30 LAB — CBG MONITORING, ED
Glucose-Capillary: 246 mg/dL — ABNORMAL HIGH (ref 70–99)
Glucose-Capillary: 225 mg/dL — ABNORMAL HIGH (ref 70–99)
Glucose-Capillary: 232 mg/dL — ABNORMAL HIGH (ref 70–99)
Glucose-Capillary: 186 mg/dL — ABNORMAL HIGH (ref 70–99)

## 2019-05-30 LAB — CK: Total CK: 317 U/L (ref 49–397)

## 2019-05-30 LAB — ETHANOL: Alcohol, Ethyl (B): <10 mg/dL (ref <10)

## 2019-05-30 LAB — SARS CORONAVIRUS 2 BY RT PCR (HOSPITAL ORDER, PERFORMED IN ~~LOC~~ HOSPITAL LAB): SARS Coronavirus 2: NEGATIVE

## 2019-05-30 LAB — HEMOGLOBIN A1C
Hgb A1c MFr Bld: 10.6 % — ABNORMAL HIGH (ref 4.8–5.6)
Mean Plasma Glucose: 257.52 mg/dL

## 2019-05-30 LAB — RAPID URINE DRUG SCREEN, HOSP PERFORMED
Amphetamines: NOT DETECTED
Barbiturates: NOT DETECTED
Benzodiazepines: NOT DETECTED
Cocaine: NOT DETECTED
Opiates: NOT DETECTED
Tetrahydrocannabinol: NOT DETECTED

## 2019-05-30 LAB — ECHOCARDIOGRAM COMPLETE

## 2019-05-30 LAB — PROTIME-INR
INR: 1 (ref 0.8–1.2)
Prothrombin Time: 13.1 seconds (ref 11.4–15.2)

## 2019-05-30 LAB — APTT: aPTT: 21 seconds — ABNORMAL LOW (ref 24–36)

## 2019-05-30 LAB — GLUCOSE, CAPILLARY: Glucose-Capillary: 220 mg/dL — ABNORMAL HIGH (ref 70–99)

## 2019-05-30 MED ORDER — ACETAMINOPHEN 325 MG PO TABS
650.0000 mg | ORAL_TABLET | ORAL | Status: DC | PRN
  Administered 2019-06-17: 650 mg via ORAL
  Filled 2019-05-30: qty 2

## 2019-05-30 MED ORDER — ACETAMINOPHEN 160 MG/5ML PO SOLN
650.0000 mg | ORAL | Status: DC | PRN
  Administered 2019-06-03 – 2019-06-24 (×18): 650 mg
  Filled 2019-05-30 (×21): qty 20.3

## 2019-05-30 MED ORDER — INSULIN ASPART 100 UNIT/ML ~~LOC~~ SOLN
0.0000 [IU] | SUBCUTANEOUS | Status: DC
  Administered 2019-05-30 (×3): 3 [IU] via SUBCUTANEOUS
  Administered 2019-05-31 – 2019-06-01 (×7): 2 [IU] via SUBCUTANEOUS
  Administered 2019-06-01: 3 [IU] via SUBCUTANEOUS
  Administered 2019-06-01 (×3): 2 [IU] via SUBCUTANEOUS
  Administered 2019-06-02: 3 [IU] via SUBCUTANEOUS
  Administered 2019-06-02 (×3): 2 [IU] via SUBCUTANEOUS
  Administered 2019-06-02 (×2): 3 [IU] via SUBCUTANEOUS
  Administered 2019-06-03: 5 [IU] via SUBCUTANEOUS
  Filled 2019-05-30: qty 0.09

## 2019-05-30 MED ORDER — ENOXAPARIN SODIUM 40 MG/0.4ML ~~LOC~~ SOLN
40.0000 mg | SUBCUTANEOUS | Status: DC
  Administered 2019-05-30: 40 mg via SUBCUTANEOUS
  Filled 2019-05-30: qty 0.4

## 2019-05-30 MED ORDER — ASPIRIN 325 MG PO TABS
325.0000 mg | ORAL_TABLET | Freq: Every day | ORAL | Status: DC
  Administered 2019-06-01: 325 mg via ORAL
  Filled 2019-05-30: qty 1

## 2019-05-30 MED ORDER — SODIUM CHLORIDE (PF) 0.9 % IJ SOLN
INTRAMUSCULAR | Status: AC
  Administered 2019-05-30: 11:00:00
  Filled 2019-05-30: qty 50

## 2019-05-30 MED ORDER — SENNOSIDES-DOCUSATE SODIUM 8.6-50 MG PO TABS: 1.0000 | ORAL_TABLET | Freq: Every evening | ORAL | Status: DC | PRN

## 2019-05-30 MED ORDER — STROKE: EARLY STAGES OF RECOVERY BOOK
Freq: Once | Status: AC
  Administered 2019-05-30: 21:00:00
  Filled 2019-05-30 (×2): qty 1

## 2019-05-30 MED ORDER — ACETAMINOPHEN 650 MG RE SUPP
650.0000 mg | RECTAL | Status: DC | PRN
  Filled 2019-05-30: qty 1

## 2019-05-30 MED ORDER — IOHEXOL 350 MG/ML SOLN
100.0000 mL | Freq: Once | INTRAVENOUS | Status: AC | PRN
  Administered 2019-05-30: 100 mL via INTRAVENOUS

## 2019-05-30 MED ORDER — ASPIRIN 300 MG RE SUPP
300.0000 mg | Freq: Every day | RECTAL | Status: DC
  Administered 2019-05-30 – 2019-05-31 (×2): 300 mg via RECTAL
  Filled 2019-05-30 (×2): qty 1

## 2019-05-30 NOTE — Progress Notes (Signed)
Spoke with Ria Comment RN at 6:53am , pt A&O, can DC tele and IV   Rn will verify not claustro     Ria Comment RN will have patient in MRI by 7:30a   We are unable to  come get patient as we have patient in scanner already

## 2019-05-30 NOTE — Progress Notes (Signed)
Pt admitted from [facility] with the diagnosis of stroke, pt alert and oriented, c/o of slight neck pain with a rating of 3, settled in bed with call light within pt's reach, tele monitor put and verified on pt, admit doc and on call neurologist paged and notified of pt's arrival, safety measures initiated accordingly, was however reassured and will continue to monitor. [name], Michael Bright

## 2019-05-30 NOTE — ED Notes (Signed)
[facility] bedside.  

## 2019-05-30 NOTE — ED Notes (Addendum)
PT IS TO BE KEPT NPO DUE TO DIFFICULTY SWALLOWING SECRETIONS, STROKE SWALLOW FAILED   SLP2 SWALLOWING EVAL ORDER PLACED UNDER STANDING ORDER DUE TO FAILING SWALLOWING TEST

## 2019-05-30 NOTE — H&P (Addendum)
[facility] History and Physical  Tarance Balan QIH:[id number] DOB: [birth year] DOA: 05/30/2019  Referring physician: Dr Leonette Monarch PCP: Patient, No Pcp Per   Chief Complaint: L leg and facial weakness  HPI: Michael Bright is a 63 y.o. male with hx of DM2, stopped taking medications/ seeing doctor's over 10 yrs ago. Presented to ED last night w/ c/o L leg weakness and numbness, onset Sunday morning (>24 hrs from presentation).  Seen in ED and pt also noted swallowing problems, L facial numbness and double vision. CT head was negative.  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 Asked to see for admission.   Pt's wife give hx that patient stopped his diabetes medication 10 yrs ago and hasn't seen doctors since.  No sob, fever or CP, no hx heart problems, hx of cholecystectomy, no other surgery.    Reports double vision, L leg weak and numb like wood, L face weak and trouble swallowing saliva.     ROS  denies CP  no joint pain   no HA  no blurry vision  no rash  no diarrhea  no nausea/ vomiting  no dysuria  no difficulty voiding  no change in urine color    Past Medical History History reviewed. No pertinent past medical history. Past Surgical History  Past Surgical History:  Procedure Laterality Date  . CHOLECYSTECTOMY     Family History No family history on file. Social History  reports that he has never smoked. He has never used smokeless tobacco. He reports current alcohol use. He reports that he does not use drugs. Allergies No Known Allergies Home medications Prior to Admission medications   Medication Sig Start Date End Date Taking? Authorizing Provider  cyclobenzaprine (FLEXERIL) 10 MG tablet Take 1 tablet (10 mg total) by mouth 3 (three) times daily as needed for muscle spasms. Patient not taking: Reported on 05/30/2019 03/22/14   Liam Graham, PA-C  etodolac (LODINE) 500 MG tablet Take 1 tablet (500 mg total) by mouth 2 (two) times daily. Patient not taking:  Reported on 05/30/2019 03/22/14   Liam Graham, PA-C   Liver Function Tests Recent Labs  Lab 05/30/19 0151  AST 29  ALT 30  ALKPHOS 77  BILITOT 1.2  PROT 8.5*  ALBUMIN 4.2   No results for input(s): LIPASE, AMYLASE in the last 168 hours. CBC Recent Labs  Lab 05/30/19 0151  WBC 8.9  NEUTROABS 6.2  HGB 16.2  HCT 49.4  MCV 91.7  PLT 875   Basic Metabolic Panel Recent Labs  Lab 05/30/19 0151  NA 132*  K 5.0  CL 95*  CO2 21*  GLUCOSE 334*  BUN 18  CREATININE 1.56*  CALCIUM 9.1     Vitals:   05/30/19 0550 05/30/19 0600 05/30/19 0630 05/30/19 0933  BP: 112/73 126/68 (!) 147/88 128/79  Pulse: 90 89 90 100  Resp: (!) 23 (!) 22 (!) 23   Temp:      SpO2: 90% (!) 89% 92% 97%   Exam: Gen alert obese pleasant AAM, coughing a lot, not in distress, not handling his secretions well  No rash, cyanosis or gangrene Sclera anicteric, throat clear  No jvd or bruits Chest clear bilat to bases RRR no MRG Abd soft ntnd no mass or ascites +bs GU normal male MS no joint effusions or deformity Ext mild pretib 1+ edema / no wounds or ulcers Neuro is alert, Ox 3 , nf     Home meds:  - none  Na 132  K 5.0  BUN 18  Cr 1.56  CA 9.1  Alb 4.2  LFT's ok   wBC 8  Hb 16  plt 240  EKG (independ reviewed) > NSR no acute CXR (independ reviewed) > pending   Assessment: 1. Acute CVA - w/ L facial and LLE weakness and dysphagia. 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  Symptom onset 48 hrs ago.  Neuro has seen, w/u in progress, see their recommendations. [facility]  2. DM2 - BS here 350 range, stopped his DM meds > 10 yrs ago. Will start w/ SSI q 4hr low dose and ^ as needed, DM team will see and make rec's.  3. HTN - BP's high to normal, hold BP meds for now 4. Renal failure- creat 1.6, get renal US and UA, f/u bmp w/ IVF's but doesn't really look dry.  5. Low grade fever - temp 99, check UA and CXR 6. Dysphagia - ST consulted , npo for now     Plan - as  above   DVT prophylaxis: lovenox Family communication: wife in room  Code status: full Admit status: INP Bed type: med tele    Vinson Moselleob Tanveer Dobberstein MD  [facility]  pgr [phone] 05/30/2019, 9:37 AM  If 7PM-7AM, please contact night-coverage [email or web address] Password [email or web address] 05/30/2019, 9:37 AM

## 2019-05-30 NOTE — ED Provider Notes (Signed)
[facility] Provider [facility]  CSN: [id number] Arrival date & time: 05/29/19 [id number]  Chief Complaint(s) Extremity Weakness (left )  HPI Michael Bright is a 63 y.o. male who presents with >24 hrs of left sided weakness. Reports that he woke Sunday morning and noted mild weakness to his left leg and face. Symptoms have persisted w/o changes since onset. Also reports difficulty swallowing. Reports left face numbness. Has double vision. No alleviating or aggravating factors. Denies headache, fever, chest pain, SOB, abd pain, N/V/D.  No prior stroke. Reports that he has not had physical in years. Does not take any medication.  HPI   Past Medical History History reviewed. No pertinent past medical history. Patient Active Problem List   Diagnosis Date Noted  . Acute CVA (cerebrovascular accident) (HCC) 05/30/2019   Home Medication(s) Prior to Admission medications   Medication Sig Start Date End Date Taking? Authorizing Provider  cyclobenzaprine (FLEXERIL) 10 MG tablet Take 1 tablet (10 mg total) by mouth 3 (three) times daily as needed for muscle spasms. Patient not taking: Reported on 05/30/2019 03/22/14   Michael Bright, Zachary H, PA-C  etodolac (LODINE) 500 MG tablet Take 1 tablet (500 mg total) by mouth 2 (two) times daily. Patient not taking: Reported on 05/30/2019 03/22/14   Michael Bright, Zachary H, PA-C                                                                                                                                    Past Surgical History Past Surgical History:  Procedure Laterality Date  . CHOLECYSTECTOMY     Family History No family history on file.  Social History Social History   Tobacco Use  . Smoking status: Never Smoker  . Smokeless tobacco: Never Used  Substance Use Topics  . Alcohol use: Yes  . Drug use: No   Allergies Patient has no known allergies.  Review of Systems Review of Systems All other systems are reviewed and are  negative for acute change except as noted in the HPI  Physical Exam Vital Signs  I have reviewed the triage vital signs BP (!) 143/93   Pulse 100   Temp 99.4 F (37.4 C)   Resp (!) 29   SpO2 94%   Physical Exam Vitals signs reviewed.  Constitutional:      General: He is not in acute distress.    Appearance: He is well-developed. He is obese. He is not diaphoretic.  HENT:     Head: Normocephalic and atraumatic.     Nose: Nose normal.  Eyes:     General: No scleral icterus.       Right eye: No discharge.        Left eye: No discharge.     Conjunctiva/sclera: Conjunctivae normal.     Pupils: Pupils are equal, round, and reactive to light.  Neck:     Musculoskeletal: Normal range of motion and neck supple.  Cardiovascular:     Rate and Rhythm: Normal rate and regular rhythm.     Heart sounds: No murmur. No friction rub. No gallop.   Pulmonary:     Effort: Pulmonary effort is normal. No respiratory distress.     Breath sounds: Normal breath sounds. No stridor. No rales.  Abdominal:     General: There is no distension.     Palpations: Abdomen is soft.     Tenderness: There is no abdominal tenderness.  Musculoskeletal:        General: No tenderness.  Skin:    General: Skin is warm and dry.     Findings: No erythema or rash.  Neurological:     Mental Status: He is alert and oriented to person, place, and time.     Comments: Mental Status:  Alert and oriented to person, place, and time.  Attention and concentration normal.  Gurgled speech.  Recent memory is intact  Cranial Nerves:  II Visual Fields: Intact to confrontation. Visual fields intact. III, IV, VI: Pupils equal and reactive to light and near. Full eye movement with vertical nystagmus  V Facial Sensation: diminished on left. No weakness of masticatory muscles  VII: left facial droop VIII Auditory Acuity: Grossly normal  IX/X: The uvula is midline; the palate elevates symmetrically. Unable to clear throat. XI:  Normal sternocleidomastoid and trapezius strength  XII: The tongue is midline. No atrophy or fasciculations.   Motor System: Muscle Strength: 5/5 and symmetric in the upper and lower extremities. No pronation or drift.  Muscle Tone: Tone and muscle bulk are normal in the upper and lower extremities.   Reflexes: DTRs: 1+ and symmetrical in all four extremities. No Clonus Coordination: Intact finger-to-nose, No tremor.  Sensation: Intact to light touch.  Gait: unable to stand.       ED Results and Treatments Labs (all labs ordered are listed, but only abnormal results are displayed) Labs Reviewed  APTT - Abnormal; Notable for the following components:      Result Value   aPTT 21 (*)    All other components within normal limits  COMPREHENSIVE METABOLIC PANEL - Abnormal; Notable for the following components:   Sodium 132 (*)    Chloride 95 (*)    CO2 21 (*)    Glucose, Bld 334 (*)    Creatinine, Ser 1.56 (*)    Total Protein 8.5 (*)    GFR calc non Af Amer 47 (*)    GFR calc Af Amer 55 (*)    Anion gap 16 (*)    All other components within normal limits  SARS CORONAVIRUS 2  ETHANOL  PROTIME-INR  CBC  DIFFERENTIAL  RAPID URINE DRUG SCREEN, HOSP PERFORMED  URINALYSIS, ROUTINE W REFLEX MICROSCOPIC                                                                                                                         EKG  EKG Interpretation  Date/Time:  Tuesday May 30 2019 05:35:30 EDT Ventricular Rate:  94 PR Interval:    QRS Duration: 98 QT Interval:  352 QTC Calculation: 441 R Axis:   36 Text Interpretation:  Sinus rhythm Low voltage, precordial leads No significant change since last tracing Confirmed by Addison Lank [phone]) on 05/30/2019 5:45:13 AM      Radiology Ct Head Wo Contrast  Result Date: 05/30/2019 CLINICAL DATA:  Leg weakness EXAM: CT HEAD WITHOUT CONTRAST TECHNIQUE: Contiguous axial images were obtained from the base of the skull through the vertex  without intravenous contrast. COMPARISON:  None. FINDINGS: Brain: No evidence of acute infarction, hemorrhage, hydrocephalus, extra-axial collection or mass lesion/mass effect. Mild subcortical white matter and periventricular small vessel ischemic changes. Vascular: Mild intracranial atherosclerosis. Skull: Normal. Negative for fracture or focal lesion. Sinuses/Orbits: The visualized paranasal sinuses are essentially clear. The mastoid air cells are unopacified. Other: None. IMPRESSION: No evidence of acute intracranial abnormality. Mild small vessel ischemic changes. Electronically Signed   By: Julian Hy M.D.   On: 05/30/2019 02:22    Pertinent labs & imaging results that were available during my care of the patient were reviewed by me and considered in my medical decision making (see chart for details).  Medications Ordered in ED Medications - No data to display                                                                                                                                  Procedures Procedures  (including critical care time)  Medical Decision Making / ED Course I have reviewed the nursing notes for this encounter and the patient's prior records (if available in EHR or on provided paperwork).   Johnryan Sao was evaluated in Emergency Department on 05/30/2019 for the symptoms described in the history of present illness. He was evaluated in the context of the global COVID-19 pandemic, which necessitated consideration that the patient might be at risk for infection with the SARS-CoV-2 virus that causes COVID-19. Institutional protocols and algorithms that pertain to the evaluation of patients at risk for COVID-19 are in a state of rapid change based on information released by regulatory bodies including the CDC and federal and state organizations. These policies and algorithms were followed during the patient's care in the ED.  Presentation concerning for brain stem CVA vs  multiple cranial nerve palsy. CT head w/o ICH. EKG Labs with: - Hyperglycemia w/o DKA. - AKI  - No anemia.  Case discussed with Dr. Cheral Marker who agreed with admitting for stroke work up.  Admitted to [facility] hospitalist service.       Final Clinical Impression(s) / ED Diagnoses Final diagnoses:  Weakness  Diplopia      This chart was dictated using voice recognition software.  Despite best efforts to proofread,  errors can occur which can change the documentation meaning.   Fatima Blank, MD 05/30/19 [phone]

## 2019-05-30 NOTE — ED Notes (Signed)
Report given to Lynelle Smoke, Therapist, sports at [facility]

## 2019-05-30 NOTE — ED Notes (Signed)
Covid swab obtained and walked to lab, pt taken to MRI

## 2019-05-30 NOTE — ED Notes (Signed)
ECHO @ bedside

## 2019-05-30 NOTE — ED Notes (Signed)
Pt with upper airway congestion, continues to self suction. Has left room for test.

## 2019-05-30 NOTE — ED Notes (Signed)
[facility] called [phone]

## 2019-05-30 NOTE — Progress Notes (Signed)
  Echocardiogram 2D Echocardiogram has been performed.  Michael Bright 05/30/2019, 4:38 PM

## 2019-05-30 NOTE — Consult Note (Signed)
Referring Physician: Dr. Toniann FailKakrakandy    Chief Complaint: Dysarthria, left facial droop and gait unsteadiness since Sunday  HPI: Michael Bright is an 63 y.o. male presenting with dysarthria, left facial droop, double vision and gait unsteadiness since Sunday. Also with dysphagia. He has no prior history of stroke. He takes ASA PRN. He is not on a blood thinner.   LSN: Sunday tPA Given: No: Out of time window.   History reviewed. No pertinent past medical history.  Past Surgical History:  Procedure Laterality Date  . CHOLECYSTECTOMY      No family history on file. Social History:  reports that he has never smoked. He has never used smokeless tobacco. He reports current alcohol use. He reports that he does not use drugs.  Allergies: No Known Allergies  Home Medications:  Flexeril Lodine PRN ASA  ROS: No headache, neck pain or vision loss. No confusion or dysphasia in the context of his dysarthria. No CP. Developed a cough with new onset dysphagia. No SOB or fever. No abdominal pain or incontinence. No sensory numbness. Otherwise as per HPI with remainder of comprehensive ROS negative.   Physical Examination: Blood pressure (!) 147/88, pulse 90, temperature 99.4 F (37.4 C), resp. rate (!) 23, SpO2 92 %.  HEENT: [place]/AT Lungs: Respirations unlabored. Has upper airway sounds consistent with retained mucus.  Ext: Warm and well perfused.   Neurologic Examination: Mental Status: Alert, fully oriented, thought content appropriate.  Speech fluent with intact naming, repetition and comprehension. Dysarthria noted.  Cranial Nerves: II:  Visual fields intact with no extinction to DSS. PERRL.   III,IV, VI: Mild left ptosis. EOM are full but with prominent endgaze nystagmus with leftward gaze and vertical nystagmus with upward gaze. When gazing to right, eyes tend to start drifting to the left. Also endorses double vision with testing of EOM.  V,VII: Left lower quadrant facial droop. Decreased  brow furrowing on the left. Temp sensation equal bilaterally.  VIII: hearing intact to voice.  IX,X: Difficult to visualize palate. Palatal dysarthria noted.  XI: Decreased shoulder shrug on the left.  XII: midline tongue extension  Motor: 5/5 x 4 with the exception of mild left deltoid weakness. Also with left pronator drift.  Sensory: Hyperesthesia to temp LUE. Temp sensation intact to RUE and BLE. No extinction with DSS.  Deep Tendon Reflexes:  2+ bilateral brachioradialis and biceps. Trace left patella, 0 right patella, 0 bilateral achilles.  Cerebellar: No ataxia with FNF on the right. H-S normal bilaterally. Has optic ataxia with past-pointing on the left.  Gait: Deferred due to falls risk concerns.   Results for orders placed or performed during the hospital encounter of 05/30/19 (from the past 48 hour(s))  Ethanol     Status: None   Collection Time: 05/30/19  1:51 AM  Result Value Ref Range   Alcohol, Ethyl (B) <10 <10 mg/dL    Comment: (NOTE) Lowest detectable limit for serum alcohol is 10 mg/dL. For medical purposes only. Performed at [facility], [address] [place], [place] [address]   Protime-INR     Status: None   Collection Time: 05/30/19  1:51 AM  Result Value Ref Range   Prothrombin Time 13.1 11.4 - 15.2 seconds   INR 1.0 0.8 - 1.2    Comment: (NOTE) INR goal varies based on device and disease states. Performed at [facility], [address] [place], [place] [address]   APTT     Status: Abnormal   Collection  Time: 05/30/19  1:51 AM  Result Value Ref Range   aPTT 21 (L) 24 - 36 seconds    Comment: Performed at [facility], [address] [place], [place] [address]  CBC     Status: None   Collection Time: 05/30/19  1:51 AM  Result Value Ref Range   WBC 8.9 4.0 - 10.5 K/uL   RBC 5.39 4.22 - 5.81 MIL/uL   Hemoglobin 16.2 13.0 - 17.0 g/dL   HCT 40.149.4 02.739.0 - 25.352.0 %   MCV 91.7 80.0 - 100.0 fL    MCH 30.1 26.0 - 34.0 pg   MCHC 32.8 30.0 - 36.0 g/dL   RDW 66.412.2 40.311.5 - 47.415.5 %   Platelets 240 150 - 400 K/uL   nRBC 0.0 0.0 - 0.2 %    Comment: Performed at [facility], [address] [place], [place] [address]  Differential     Status: None   Collection Time: 05/30/19  1:51 AM  Result Value Ref Range   Neutrophils Relative % 70 %   Neutro Abs 6.2 1.7 - 7.7 K/uL   Lymphocytes Relative 21 %   Lymphs Abs 1.8 0.7 - 4.0 K/uL   Monocytes Relative 6 %   Monocytes Absolute 0.5 0.1 - 1.0 K/uL   Eosinophils Relative 3 %   Eosinophils Absolute 0.3 0.0 - 0.5 K/uL   Basophils Relative 0 %   Basophils Absolute 0.0 0.0 - 0.1 K/uL   Immature Granulocytes 0 %   Abs Immature Granulocytes 0.03 0.00 - 0.07 K/uL    Comment: Performed at [facility], [address] [place], [place] [address]  Comprehensive metabolic panel     Status: Abnormal   Collection Time: 05/30/19  1:51 AM  Result Value Ref Range   Sodium 132 (L) 135 - 145 mmol/L   Potassium 5.0 3.5 - 5.1 mmol/L   Chloride 95 (L) 98 - 111 mmol/L   CO2 21 (L) 22 - 32 mmol/L   Glucose, Bld 334 (H) 70 - 99 mg/dL   BUN 18 8 - 23 mg/dL   Creatinine, Ser 4.331.56 (H) 0.61 - 1.24 mg/dL   Calcium 9.1 8.9 - 29.510.3 mg/dL   Total Protein 8.5 (H) 6.5 - 8.1 g/dL   Albumin 4.2 3.5 - 5.0 g/dL   AST 29 15 - 41 U/L   ALT 30 0 - 44 U/L   Alkaline Phosphatase 77 38 - 126 U/L   Total Bilirubin 1.2 0.3 - 1.2 mg/dL   GFR calc non Af Amer 47 (L) >60 mL/min   GFR calc Af Amer 55 (L) >60 mL/min   Anion gap 16 (H) 5 - 15    Comment: Performed at [facility], [address] [place], [place] [address]   Ct Head Wo Contrast  Result Date: 05/30/2019 CLINICAL DATA:  Leg weakness EXAM: CT HEAD WITHOUT CONTRAST TECHNIQUE: Contiguous axial images were obtained from the base of the skull through the vertex without intravenous contrast. COMPARISON:  None. FINDINGS: Brain: No evidence of acute infarction,  hemorrhage, hydrocephalus, extra-axial collection or mass lesion/mass effect. Mild subcortical white matter and periventricular small vessel ischemic changes. Vascular: Mild intracranial atherosclerosis. Skull: Normal. Negative for fracture or focal lesion. Sinuses/Orbits: The visualized paranasal sinuses are essentially clear. The mastoid air cells are unopacified. Other: None. IMPRESSION: No evidence of acute intracranial abnormality. Mild small vessel ischemic changes. Electronically Signed   By: Charline BillsSriyesh  Krishnan M.D.   On: 05/30/2019 02:22  Assessment: 63 y.o. male presenting with new onset of dysphagia, left facial weakness, nystagmus and gait unsteadiness with listing towards the left.  1. CT head reveals no acute abnormality. Chronic small vessel ischemic changes are noted.  2. Exam reveals findings referable to the left midbrain and pons with possible thalamic involvement. Most likely secondary to an early subacute ischemic stroke.  3. Stroke Risk Factors - DM  Plan: 1. HgbA1c, fasting lipid panel 2. MRI, MRA  of the brain without contrast 3. PT consult, OT consult, Speech consult 4. Echocardiogram 5. Carotid dopplers 6. Prophylactic therapy- Start scheduled ASA 325 mg po qd 7. Risk factor modification 8. Telemetry monitoring 9. Frequent neuro checks 10. BP management. Out of permissive HTN time window.  11. Consider starting atorvastatin if CK is normal. (CK level ordered).    @Electronically  signed: Dr. Kerney Elbe  05/30/2019, 7:24 AM

## 2019-05-31 ENCOUNTER — Encounter (HOSPITAL_COMMUNITY): Payer: Self-pay | Admitting: *Deleted

## 2019-05-31 ENCOUNTER — Inpatient Hospital Stay (HOSPITAL_COMMUNITY): Payer: BC Managed Care – PPO

## 2019-05-31 DIAGNOSIS — N289 Disorder of kidney and ureter, unspecified: Secondary | ICD-10-CM

## 2019-05-31 DIAGNOSIS — R1310 Dysphagia, unspecified: Secondary | ICD-10-CM

## 2019-05-31 DIAGNOSIS — I6302 Cerebral infarction due to thrombosis of basilar artery: Secondary | ICD-10-CM

## 2019-05-31 LAB — GLUCOSE, CAPILLARY
Glucose-Capillary: 197 mg/dL — ABNORMAL HIGH (ref 70–99)
Glucose-Capillary: 202 mg/dL — ABNORMAL HIGH (ref 70–99)
Glucose-Capillary: 177 mg/dL — ABNORMAL HIGH (ref 70–99)
Glucose-Capillary: 189 mg/dL — ABNORMAL HIGH (ref 70–99)
Glucose-Capillary: 197 mg/dL — ABNORMAL HIGH (ref 70–99)
Glucose-Capillary: 192 mg/dL — ABNORMAL HIGH (ref 70–99)
Glucose-Capillary: 181 mg/dL — ABNORMAL HIGH (ref 70–99)
Glucose-Capillary: 189 mg/dL — ABNORMAL HIGH (ref 70–99)

## 2019-05-31 LAB — MAGNESIUM: Magnesium: 2.4 mg/dL (ref 1.7–2.4)

## 2019-05-31 LAB — COMPREHENSIVE METABOLIC PANEL
ALT: 31 U/L (ref 0–44)
AST: 28 U/L (ref 15–41)
Albumin: 3.7 g/dL (ref 3.5–5.0)
Alkaline Phosphatase: 82 U/L (ref 38–126)
Anion gap: 10 (ref 5–15)
BUN: 21 mg/dL (ref 8–23)
CO2: 25 mmol/L (ref 22–32)
Calcium: 8.9 mg/dL (ref 8.9–10.3)
Chloride: 101 mmol/L (ref 98–111)
Creatinine, Ser: 1.08 mg/dL (ref 0.61–1.24)
GFR calc Af Amer: >60 mL/min (ref >60)
GFR calc non Af Amer: >60 mL/min (ref >60)
Glucose, Bld: 206 mg/dL — ABNORMAL HIGH (ref 70–99)
Potassium: 4.1 mmol/L (ref 3.5–5.1)
Sodium: 136 mmol/L (ref 135–145)
Total Bilirubin: 1 mg/dL (ref 0.3–1.2)
Total Protein: 7.5 g/dL (ref 6.5–8.1)

## 2019-05-31 LAB — MRSA PCR SCREENING: MRSA by PCR: NEGATIVE

## 2019-05-31 LAB — PHOSPHORUS: Phosphorus: 4.2 mg/dL (ref 2.5–4.6)

## 2019-05-31 MED ORDER — BISACODYL 10 MG RE SUPP
10.0000 mg | Freq: Every day | RECTAL | Status: DC | PRN
  Administered 2019-05-31 – 2019-06-23 (×3): 10 mg via RECTAL
  Filled 2019-05-31 (×3): qty 1

## 2019-05-31 MED ORDER — FENTANYL CITRATE (PF) 100 MCG/2ML IJ SOLN
INTRAMUSCULAR | Status: AC
  Filled 2019-05-31: qty 2

## 2019-05-31 MED ORDER — ENOXAPARIN SODIUM 80 MG/0.8ML ~~LOC~~ SOLN
70.0000 mg | Freq: Every day | SUBCUTANEOUS | Status: DC
  Administered 2019-05-31 – 2019-06-04 (×5): 70 mg via SUBCUTANEOUS
  Filled 2019-05-31 (×5): qty 0.8

## 2019-05-31 MED ORDER — ORAL CARE MOUTH RINSE
15.0000 mL | Freq: Two times a day (BID) | OROMUCOSAL | Status: DC
  Administered 2019-05-31 – 2019-06-03 (×8): 15 mL via OROMUCOSAL

## 2019-05-31 MED ORDER — CHLORHEXIDINE GLUCONATE CLOTH 2 % EX PADS
6.0000 | MEDICATED_PAD | Freq: Every day | CUTANEOUS | Status: DC
  Administered 2019-06-01 – 2019-06-02 (×2): 6 via TOPICAL

## 2019-05-31 MED ORDER — SCOPOLAMINE 1 MG/3DAYS TD PT72
1.0000 | MEDICATED_PATCH | TRANSDERMAL | Status: DC
  Administered 2019-05-31 – 2019-06-18 (×7): 1.5 mg via TRANSDERMAL
  Filled 2019-05-31 (×8): qty 1

## 2019-05-31 MED ORDER — GLYCOPYRROLATE 0.2 MG/ML IJ SOLN
0.2000 mg | INTRAMUSCULAR | Status: DC | PRN
  Administered 2019-05-31: 0.2 mg via INTRAVENOUS
  Filled 2019-05-31: qty 1

## 2019-05-31 MED ORDER — LEVALBUTEROL HCL 1.25 MG/0.5ML IN NEBU
1.2500 mg | INHALATION_SOLUTION | Freq: Once | RESPIRATORY_TRACT | Status: AC
  Administered 2019-05-31: 1.25 mg via RESPIRATORY_TRACT
  Filled 2019-05-31: qty 0.5

## 2019-05-31 MED ORDER — MIDAZOLAM HCL 2 MG/2ML IJ SOLN
INTRAMUSCULAR | Status: AC
  Filled 2019-05-31: qty 2

## 2019-05-31 MED ORDER — SODIUM CHLORIDE 0.9 % IV SOLN
INTRAVENOUS | Status: DC
  Administered 2019-05-31 – 2019-06-14 (×8): via INTRAVENOUS

## 2019-05-31 MED ORDER — CHLORHEXIDINE GLUCONATE 0.12 % MT SOLN
15.0000 mL | Freq: Two times a day (BID) | OROMUCOSAL | Status: DC
  Administered 2019-06-01 – 2019-06-02 (×3): 15 mL via OROMUCOSAL
  Filled 2019-05-31 (×3): qty 15

## 2019-05-31 NOTE — Progress Notes (Signed)
Patient was admitted and managed by primary team TRH however decompensated overnight and Critical Care evaluated this morning and they transferred the patient to 55M to monitor the patient closely.  I spoke with Dr. Ander Slade who states that PCCM will manage currently and transfer back to [facility] service in next 24 to 48 hours if patient is stable.  Patient has a high risk for intubation and needs intensive care monitoring in case he decompensates further as he is unable to manage his secretions secondary to his stroke.  TRH will be available once he is transferred out of the intensive care unit.

## 2019-05-31 NOTE — Progress Notes (Signed)
Patient with increased secretions, constantly suctioning with oral suction.  Hoarse, high pitch voice.  Orders for ICU.  Assisted with transfer to 3M08.  Transferred in bed with O2 and heart monitor

## 2019-05-31 NOTE — Evaluation (Signed)
Clinical/Bedside Swallow Evaluation Patient Details  Name: Michael Bright MRN: [id number] Date of Birth: [birth year]  Today's Date: 05/31/2019 Time: SLP Start Time (ACUTE ONLY): 6073 SLP Stop Time (ACUTE ONLY): 0945 SLP Time Calculation (min) (ACUTE ONLY): 20 min  Past Medical History: History reviewed. No pertinent past medical history. Past Surgical History:  Past Surgical History:  Procedure Laterality Date  . CHOLECYSTECTOMY     HPI:  63 y.o. male with hx of DM2l presented to ED on 05/30/19 w/ c/o L leg weakness and numbness, onset Sunday morning (>24 hrs from presentation); pt also noted swallowing problems, L facial numbness and double vision. CT head was negative.  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  Assessment / Plan / Recommendation Clinical Impression  Oral care administered with oral suction following d/t pt having difficulty managing secretions at present; hoarse, wet vocal quality with stridor noted during speaking; pt with difficulty speaking d/t dyspnea and O2 saturation between 93-95 throughout assessment.  SLE deferred at this time. Nursing provided deep suctioning prior to oral care d/t vocal quality being poor; No PO trials d/t severe risk for aspiration. NPO recommended with ST f/u for PO trials/SLE as pt able during acute stay.  Thank you for this consult. SLP Visit Diagnosis: Dysphagia, oropharyngeal phase (R13.12)    Aspiration Risk  Severe aspiration risk    Diet Recommendation   NPO  Medication Administration: Via alternative means    Other  Recommendations Oral Care Recommendations: Oral care QID   Follow up Recommendations Other (comment)(TBD)      Frequency and Duration min 3x week  1 week       Prognosis Prognosis for Safe Diet Advancement: Fair      Swallow Study   General Date of Onset: 05/30/19 HPI: 63 y.o. male with hx of DM2, stopped taking medications/ seeing doctor's over 10 yrs ago.  Presented to ED last night w/ c/o L leg weakness and numbness, onset Sunday morning (>24 hrs from presentation).  Seen in ED and pt also noted swallowing problems, L facial numbness and double vision. CT head was negative.  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 Asked to see for admission Type of Study: Bedside Swallow Evaluation Previous Swallow Assessment: ([facility], failed) Diet Prior to this Study: NPO Temperature Spikes Noted: No Respiratory Status: Nasal cannula;Other (comment)(4L) History of Recent Intubation: No Behavior/Cognition: Alert;Cooperative;Distractible;Other (Comment)(anxiety d/t dyspnea) Oral Cavity - Dentition: Adequate natural dentition Vision: Functional for self-feeding Self-Feeding Abilities: Able to feed self Patient Positioning: Upright in bed Baseline Vocal Quality: Wet;Breathy;Hoarse;Other (comment)(stridor) Volitional Cough: Strong Volitional Swallow: Able to elicit    Oral/Motor/Sensory Function Overall Oral Motor/Sensory Function: Other (comment)(grossly WNL)   Ice Chips Ice chips: Not tested   Thin Liquid Thin Liquid: Not tested    Nectar Thick Nectar Thick Liquid: Not tested   Honey Thick Honey Thick Liquid: Not tested   Puree Puree: Not tested   Solid     Solid: Not tested      Elvina Sidle, M.S., CCC-SLP 05/31/2019,10:52 AM

## 2019-05-31 NOTE — Consult Note (Signed)
NAME:  Michael KilRandolph Birchmeier, MRN:  [id number], DOB:  1956/01/20, LOS: 1 ADMISSION DATE:  05/30/2019, CONSULTATION DATE:  05/31/19 REFERRING MD:  Antionette Charpyd  CHIEF COMPLAINT:  Secretions   Brief History   Michael Bright is a 63 y.o. 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  PCCM asked to evaluate early AM 8/12 for poor secretion management.  History of present illness   Michael Bright is a 63 y.o. male who has a PMH including but not limited to DM, HTN.  He apparently stopped taking medications and / or seeing health care providers over 10 years ago.   [facility] with LLE weakness and numnbess that started 8/9.  Also had dysphagia, L facial numbness, double vision.  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  [facility].  [facility]  Early AM 6/12, he had moment of increased RR with increase in oral secretions.  He required suctioning by RT due to inability to clear secretions.  He was also noted to have accessory muscle usage; therefore, PCCm asked to evaluate.  At the time of our evaluation, he did have secretions; however, work of breathing had significantly improved.  He was able to speak in full sentences.  He did require assistance with oral suctioning (he has been using yankaur most of the night; however, required assistance getting it farther back into posterior oropharynx).  Does have strong cough, weak gag.  Past Medical History  DM, HTN.  Significant Hospital Events   8/11 > admit. 8/12 > PCCM consult.  Consults:  Neuro. PCCM.  Procedures:  None.  Significant Diagnostic Tests:  CT head 8/11 > neg. MRI brain 8/11 > small left medulla infarct. Renal US 8/11 > neg. Echo 8/11 > EF 60-65%. CXR 8/12 > atx.  Micro Data:  SARS CoV2 8/11 > neg.  Antimicrobials:  None.   Interim history/subjective:  Breathing comfortably.  "Feel a bit better". Does have posterior oral secretions and  needed assistance with suctioning farther back into posterior oropharynx.  Objective:  Blood pressure (!) 156/84, pulse (!) 125, temperature 97.7 F (36.5 C), temperature source Oral, resp. rate (!) 28, height 6' (1.829 m), weight (!) 146.1 kg, SpO2 90 %.        Intake/Output Summary (Last 24 hours) at 05/31/2019 0635 Last data filed at 05/31/2019 0440 Gross per 24 hour  Intake -  Output 500 ml  Net -500 ml   Filed Weights   05/30/19 2100  Weight: (!) 146.1 kg    Examination: General: Adult male, in NAD. Neuro: A&O x 3, no deficits appreciated. HEENT: [place]/AT. Sclerae anicteric.  EOMI. Cardiovascular: RRR, no M/R/G.  Lungs: Respirations even and unlabored.  Diminished in bases.  Posterior oropharynx secretions requiring suctioning. Abdomen: BS x 4, soft, NT/ND.  Musculoskeletal: No gross deformities, no edema.  Skin: Intact, warm, no rashes.  Assessment & Plan:   L medulla CVA. - Per neuro.  Dysphagia with oral secretions - able to self suction; though occasionally needing assistance to get into posterior oropharynx / NTS. - Continue bronchial hygiene with frequent suctioning (have asked RN to assist with this). - Add NTS suctioning PRN. - Add Robinul PRN. - No need for intubation at this time; though at risk if secretion management / cough worsens or he is unable to continue with oral suctioning. - Will have day team follow up and check back in on pt  to ensure no distress / continued ability to clear secretions. - SLP eval.  DM2. - SSI.  Rest per primary team.  Best Practice:  Diet: NPO. Pain/Anxiety/Delirium protocol (if indicated): N/A. VAP protocol (if indicated): N/A. DVT prophylaxis: SCD's / Enoxaparin. GI prophylaxis:  Glucose control: SSI. Mobility: Bedrest. Code Status: Full. Family Communication: None available. Disposition: Tele.  Labs   CBC: Recent Labs  Lab 05/30/19 0151  WBC 8.9  NEUTROABS 6.2  HGB 16.2  HCT 49.4  MCV 91.7  PLT 240    Basic Metabolic Panel: Recent Labs  Lab 05/30/19 0151  NA 132*  K 5.0  CL 95*  CO2 21*  GLUCOSE 334*  BUN 18  CREATININE 1.56*  CALCIUM 9.1   GFR: Estimated Creatinine Clearance: 73.9 mL/min (A) (by C-G formula based on SCr of 1.56 mg/dL (H)). Recent Labs  Lab 05/30/19 0151  WBC 8.9   Liver Function Tests: Recent Labs  Lab 05/30/19 0151  AST 29  ALT 30  ALKPHOS 77  BILITOT 1.2  PROT 8.5*  ALBUMIN 4.2   No results for input(s): LIPASE, AMYLASE in the last 168 hours. No results for input(s): AMMONIA in the last 168 hours. ABG    Component Value Date/Time   TCO2 21 01/20/2008 1238    Coagulation Profile: Recent Labs  Lab 05/30/19 0151  INR 1.0   Cardiac Enzymes: Recent Labs  Lab 05/30/19 1122  CKTOTAL 317   HbA1C: Hgb A1c MFr Bld  Date/Time Value Ref Range Status  05/30/2019 11:22 AM 10.6 (H) 4.8 - 5.6 % Final    Comment:    (NOTE) Pre diabetes:          5.7%-6.4% Diabetes:              >6.4% Glycemic control for   <7.0% adults with diabetes    CBG: Recent Labs  Lab 05/30/19 1602 05/30/19 1955 05/30/19 2047 05/31/19 0059 05/31/19 0331  GLUCAP 232* 186* 220* 197* 202*    Review of Systems:   All negative; except for those that are bolded, which indicate positives.  Constitutional: weight loss, weight gain, night sweats, fevers, chills, fatigue, weakness.  HEENT: headaches, sore throat, sneezing, nasal congestion, post nasal drip, difficulty swallowing, tooth/dental problems, visual complaints, visual changes, ear aches. Neuro: difficulty with speech, weakness, numbness, ataxia. CV:  chest pain, orthopnea, PND, swelling in lower extremities, dizziness, palpitations, syncope.  Resp: cough, hemoptysis, dyspnea, wheezing, oral secretions. GI: heartburn, indigestion, abdominal pain, nausea, vomiting, diarrhea, constipation, change in bowel habits, loss of appetite, hematemesis, melena, hematochezia, difficulty swallowing. GU: dysuria, change  in color of urine, urgency or frequency, flank pain, hematuria. MSK: joint pain or swelling, decreased range of motion. Psych: change in mood or affect, depression, anxiety, suicidal ideations, homicidal ideations. Skin: rash, itching, bruising.   Past medical history  He,  has no past medical history on file.   Surgical History    Past Surgical History:  Procedure Laterality Date  . CHOLECYSTECTOMY       Social History   reports that he has never smoked. He has never used smokeless tobacco. He reports current alcohol use. He reports that he does not use drugs.   Family history   His family history is not on file.   Allergies No Known Allergies   Home meds  Prior to Admission medications   Not on File     Rutherford Guysahul Jami Ohlin, [place] - [facility] Pager: [phone].  If  no answer, [phone] 05/31/2019, 6:35 AM

## 2019-05-31 NOTE — Progress Notes (Signed)
PT Cancellation Note  Patient Details Name: Michael Bright MRN: [id number] DOB: [birth year]   Cancelled Treatment:    Reason Eval/Treat Not Completed: Medical issues which prohibited therapy; patient with difficulty managing secretions and transferred to ICU.  Will cancel and attempt evaluation another day.   Reginia Naas 05/31/2019, [place], [place] [phone] 05/31/2019

## 2019-05-31 NOTE — Progress Notes (Signed)
STROKE TEAM PROGRESS NOTE   INTERVAL HISTORY Pt transferred to ICU due to difficulty with secretions and airway potential difficulty. Currently pt stable still need frequent suctioning and hoarseness. Will need cortrak for nutrition and medication. Will gave scopolamine patch for decrease secretion.   Vitals:   05/31/19 0900 05/31/19 1000 05/31/19 1100 05/31/19 1136  BP: (!) 159/84 (!) 168/83 (!) 146/91   Pulse: (!) 117 (!) 117 (!) 119   Resp: (!) 28 (!) 31 (!) 33   Temp:    98.7 F (37.1 C)  TempSrc:    Oral  SpO2: 94% 96% 95%   Weight:      Height:        CBC:  Recent Labs  Lab 05/30/19 0151  WBC 8.9  NEUTROABS 6.2  HGB 16.2  HCT 49.4  MCV 91.7  PLT 240    Basic Metabolic Panel:  Recent Labs  Lab 05/30/19 0151 05/31/19 0807  NA 132* 136  K 5.0 4.1  CL 95* 101  CO2 21* 25  GLUCOSE 334* 206*  BUN 18 21  CREATININE 1.56* 1.08  CALCIUM 9.1 8.9  MG  --  2.4  PHOS  --  4.2   Lipid Panel: No results found for: CHOL, TRIG, HDL, CHOLHDL, VLDL, LDLCALC HgbA1c:  Lab Results  Component Value Date   HGBA1C 10.6 (H) 05/30/2019   Urine Drug Screen:     Component Value Date/Time   LABOPIA NONE DETECTED 05/30/2019 0151   COCAINSCRNUR NONE DETECTED 05/30/2019 0151   LABBENZ NONE DETECTED 05/30/2019 0151   AMPHETMU NONE DETECTED 05/30/2019 0151   THCU NONE DETECTED 05/30/2019 0151   LABBARB NONE DETECTED 05/30/2019 0151    Alcohol Level     Component Value Date/Time   ETH <10 05/30/2019 0151    IMAGING Ct Angio Head W Or Wo Contrast  Result Date: 05/30/2019 CLINICAL DATA:  Left leg weakness EXAM: CT ANGIOGRAPHY HEAD AND NECK TECHNIQUE: Multidetector CT imaging of the head and neck was performed using the standard protocol during bolus administration of intravenous contrast. Multiplanar CT image reconstructions and MIPs were obtained to evaluate the vascular anatomy. Carotid stenosis measurements (when applicable) are obtained utilizing NASCET criteria, using the  distal internal carotid diameter as the denominator. CONTRAST:  100mL OMNIPAQUE IOHEXOL 350 MG/ML SOLN COMPARISON:  CT and MRI head 05/30/2019 FINDINGS: CTA NECK FINDINGS Aortic arch: Standard branching. Imaged portion shows no evidence of aneurysm or dissection. No significant stenosis of the major arch vessel origins. Right carotid system: Mild atherosclerotic disease right carotid bulb without significant stenosis. Left carotid system: Mild atherosclerotic disease left carotid bifurcation without significant stenosis. Vertebral arteries: Left vertebral artery dominant. Mild calcific stenosis origin of left vertebral artery. Right vertebral artery is small and ends in PICA. Skeleton:   Right lower molar periapical lucency. Other neck: Negative for mass or adenopathy in the neck. Mild thyroid enlargement. Upper chest: Lung apices clear bilaterally. Review of the MIP images confirms the above findings CTA HEAD FINDINGS Anterior circulation: Atherosclerotic disease in the cavernous carotid bilaterally. Moderate supraclinoid carotid stenosis bilaterally due to atherosclerotic disease. Atherosclerotic irregularity in the anterior and middle cerebral arteries bilaterally diffusely but without significant stenosis or large vessel occlusion. Posterior circulation: Left vertebral artery is dominant and supplies the basilar. Right vertebral artery is small and ends in PICA. Basilar widely patent. Superior cerebellar and posterior cerebral arteries patent bilaterally. Venous sinuses: Patent Anatomic variants: None Review of the MIP images confirms the above findings IMPRESSION: 1. Negative for  emergent large vessel occlusion. Diffuse intracranial atherosclerotic disease. Moderate stenosis supraclinoid internal carotid artery bilaterally 2. Carotid artery widely patent in the neck bilaterally with mild atherosclerotic disease at the bifurcation bilaterally 3. Left vertebral artery dominant with mild stenosis at the origin.  Electronically Signed   By: Franchot Gallo M.D.   On: 05/30/2019 11:08   Dg Chest 2 View  Result Date: 05/30/2019 CLINICAL DATA:  Left-sided facial numbness. EXAM: CHEST - 2 VIEW COMPARISON:  None. FINDINGS: Mild cardiomegaly is noted. No pneumothorax or pleural effusion is noted. Both lungs are clear. The visualized skeletal structures are unremarkable. IMPRESSION: No active cardiopulmonary disease. Electronically Signed   By: Marijo Conception M.D.   On: 05/30/2019 10:16   Ct Head Wo Contrast  Result Date: 05/30/2019 CLINICAL DATA:  Leg weakness EXAM: CT HEAD WITHOUT CONTRAST TECHNIQUE: Contiguous axial images were obtained from the base of the skull through the vertex without intravenous contrast. COMPARISON:  None. FINDINGS: Brain: No evidence of acute infarction, hemorrhage, hydrocephalus, extra-axial collection or mass lesion/mass effect. Mild subcortical white matter and periventricular small vessel ischemic changes. Vascular: Mild intracranial atherosclerosis. Skull: Normal. Negative for fracture or focal lesion. Sinuses/Orbits: The visualized paranasal sinuses are essentially clear. The mastoid air cells are unopacified. Other: None. IMPRESSION: No evidence of acute intracranial abnormality. Mild small vessel ischemic changes. Electronically Signed   By: Julian Hy M.D.   On: 05/30/2019 02:22   Ct Angio Neck W Or Wo Contrast  Result Date: 05/30/2019 CLINICAL DATA:  Left leg weakness EXAM: CT ANGIOGRAPHY HEAD AND NECK TECHNIQUE: Multidetector CT imaging of the head and neck was performed using the standard protocol during bolus administration of intravenous contrast. Multiplanar CT image reconstructions and MIPs were obtained to evaluate the vascular anatomy. Carotid stenosis measurements (when applicable) are obtained utilizing NASCET criteria, using the distal internal carotid diameter as the denominator. CONTRAST:  130mL OMNIPAQUE IOHEXOL 350 MG/ML SOLN COMPARISON:  CT and MRI head  05/30/2019 FINDINGS: CTA NECK FINDINGS Aortic arch: Standard branching. Imaged portion shows no evidence of aneurysm or dissection. No significant stenosis of the major arch vessel origins. Right carotid system: Mild atherosclerotic disease right carotid bulb without significant stenosis. Left carotid system: Mild atherosclerotic disease left carotid bifurcation without significant stenosis. Vertebral arteries: Left vertebral artery dominant. Mild calcific stenosis origin of left vertebral artery. Right vertebral artery is small and ends in PICA. Skeleton:   Right lower molar periapical lucency. Other neck: Negative for mass or adenopathy in the neck. Mild thyroid enlargement. Upper chest: Lung apices clear bilaterally. Review of the MIP images confirms the above findings CTA HEAD FINDINGS Anterior circulation: Atherosclerotic disease in the cavernous carotid bilaterally. Moderate supraclinoid carotid stenosis bilaterally due to atherosclerotic disease. Atherosclerotic irregularity in the anterior and middle cerebral arteries bilaterally diffusely but without significant stenosis or large vessel occlusion. Posterior circulation: Left vertebral artery is dominant and supplies the basilar. Right vertebral artery is small and ends in PICA. Basilar widely patent. Superior cerebellar and posterior cerebral arteries patent bilaterally. Venous sinuses: Patent Anatomic variants: None Review of the MIP images confirms the above findings IMPRESSION: 1. Negative for emergent large vessel occlusion. Diffuse intracranial atherosclerotic disease. Moderate stenosis supraclinoid internal carotid artery bilaterally 2. Carotid artery widely patent in the neck bilaterally with mild atherosclerotic disease at the bifurcation bilaterally 3. Left vertebral artery dominant with mild stenosis at the origin. Electronically Signed   By: Franchot Gallo M.D.   On: 05/30/2019 11:08   Mr Brain Wo Contrast  Result Date: 05/30/2019 CLINICAL  DATA:  Leg weakness EXAM: MRI HEAD WITHOUT CONTRAST TECHNIQUE: Multiplanar, multiecho pulse sequences of the brain and surrounding structures were obtained without intravenous contrast. COMPARISON:  None. FINDINGS: Brain: 1 cm acute infarct in the lateral left medulla. Small vessel ischemic type change in the deep cerebral white matter and to a lesser extent in the pons, mild. No acute hemorrhage, hydrocephalus, or masslike finding. Negative for atrophy Vascular: Major flow voids are preserved. Strongly dominant left vertebral artery. Skull and upper cervical spine: Nonspecific low upper cervical marrow signal without focal lesion. Skull base and calvarial marrow signal is normal. Sinuses/Orbits: Negative IMPRESSION: 1. Small acute infarct in the lateral left medulla. 2. Chronic small vessel ischemia in the cerebral white matter and pons. Electronically Signed   By: Marnee Spring M.D.   On: 05/30/2019 08:37   US Renal  Result Date: 05/30/2019 CLINICAL DATA:  Renal failure. EXAM: RENAL / URINARY TRACT ULTRASOUND COMPLETE COMPARISON:  None. FINDINGS: Right Kidney: Renal measurements: 13.4 x 6.4 x 7.6 cm = volume: 339 mL . Echogenicity within normal limits. No mass or hydronephrosis visualized. Left Kidney: Renal measurements: 13.3 x 6.2 x 5.9 cm = volume: 256 mL. Echogenicity within normal limits. No mass or hydronephrosis visualized. Bladder: Appears normal for degree of bladder distention. IMPRESSION: Normal renal ultrasound. Electronically Signed   By: Obie Dredge M.D.   On: 05/30/2019 13:10   Dg Chest Port 1 View  Result Date: 05/31/2019 CLINICAL DATA:  Acute respiratory failure EXAM: PORTABLE CHEST 1 VIEW COMPARISON:  05/31/2019 FINDINGS: Cardiac shadow is within normal limits. Previously seen atelectasis is again noted in the bases. No new focal abnormality is seen. No bony abnormality is noted. IMPRESSION: Stable basilar atelectasis.  No new focal abnormality is seen. Electronically Signed   By:  Alcide Clever M.D.   On: 05/31/2019 11:51   Dg Chest Port 1 View  Result Date: 05/31/2019 CLINICAL DATA:  Cough.  Possible aspiration. EXAM: PORTABLE CHEST 1 VIEW COMPARISON:  Yesterday FINDINGS: Low volume chest with streaky opacity at the bases. Stable cardiomegaly. No effusion or pneumothorax. IMPRESSION: 1. Low volume chest with atelectatic type opacity at the bases. 2. Cardiomegaly. Electronically Signed   By: Marnee Spring M.D.   On: 05/31/2019 05:15   2D Echocardiogram  1. The left ventricle has normal systolic function with an ejection fraction of 60-65%. The cavity size was normal. There is mildly increased left ventricular wall thickness. Left ventricular diastolic parameters were normal.  2. The right ventricle has normal systolic function. The cavity was normal. There is no increase in right ventricular wall thickness.  3. Moderate thickening of the mitral valve leaflet. Mild calcification of the mitral valve leaflet.  4. The aortic valve is tricuspid. Mild thickening of the aortic valve. Mild calcification of the aortic valve.  5. The aorta is normal in size and structure.  6. The interatrial septum was not well visualized.  PHYSICAL EXAM  Temp:  [97.7 F (36.5 C)-98.8 F (37.1 C)] 98.1 F (36.7 C) (08/12 1600) Pulse Rate:  [90-128] 112 (08/12 1600) Resp:  [18-41] 41 (08/12 1600) BP: (121-168)/(74-107) 148/107 (08/12 1600) SpO2:  [90 %-100 %] 94 % (08/12 1600) Weight:  [146.1 kg] 146.1 kg (08/11 2100)  General - Well nourished, well developed, in no apparent distress.  Ophthalmologic - fundi not visualized due to noncooperation.  Cardiovascular - Regular rate and rhythm.  Mental Status -  Level of arousal and orientation to time, place, and person  were intact. Language including expression, naming, repetition, comprehension was assessed and found intact. However, paucity of speech due to coughing and hoarsness  Cranial Nerves II - XII - II - Visual field intact  OU. III, IV, VI - Extraocular movements intact. However, vertical nystagmus with up or downward gaze.  V - Facial sensation decreased on the left. VII - left nasolabial fold flattening. VIII - Hearing & vestibular intact bilaterally. X - Hoarseness and difficulty with swallowing XI - Chin turning & shoulder shrug intact bilaterally. XII - Tongue protrusion intact.  Motor Strength - The patient's strength was normal in all extremities and pronator drift was absent.  Bulk was normal and fasciculations were absent.   Motor Tone - Muscle tone was assessed at the neck and appendages and was normal.  Reflexes - The patient's reflexes were symmetrical in all extremities and he had no pathological reflexes.  Sensory - Light touch, temperature/pinprick were assessed and were symmetrical.    Coordination - The patient had normal movements in the hands with no ataxia or dysmetria.  Tremor was absent.  Gait and Station - deferred.   ASSESSMENT/PLAN [name] is a 63 y.o. male with hx DB who stopped meds 10 yrs ago and morbid obesity presenting to [facility] ED with dysarthria, left facial droop, double vision and gait unsteadiness since Sunday 8/9.   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 Mild small vessel disease.     MRI  Small lateral L medullary infarct. Small vessel disease white matter and pons.   CTA head & neck no ELVO. Diffused intracranial atherosclerosis. Mod supraclinoid B ICAs. Mild stenosis L VA origin  2D Echo EF 60-65%. No source of embolus   LDL pending  HgbA1c 10.6  Lovenox 70 mg sq daily for VTE prophylaxis  No antithrombotic prior to admission, now on aspirin 300 PR mg daily. Consider DAPT once po access.  Therapy recommendations:  pending   Disposition:  pending   Hypoxemic Respiratory failure Hoarseness  Secondary to medullary stroke  NTS prn, robinul  prn  Increased secretions and tachypnea/tachycardia  transferred to ICU to monitor  Added scopolamine patch  CCM on board  Possible Aspiration PNA  Secondary to medullary stroke  Low grade temp  CXR LLL opacity  No abx indicated at present  CCM on board  Blood Pressure  No hx HTN  BP elevated 150s with respiratory distress  Permissive hypertension (OK if < 220/120) for 48 hours and then gradually normalized in 3-5 days.  . BP goal normotensive  Diabetes type II, new diagnosis  Home meds:  none  HgbA1c 10.6, goal < 7.0  CBGs  SSI  DB coordinator consulted  Dysphagia . Secondary to medullary stroke . Did not pass swallow . NPO on IVF @ 100 . Tube placement in am . Speech on board   Other Stroke Risk Factors  ETOH use, advised to drink no more than 2 drink(s) a day  Morbid Obesity, Body mass index is 43.68 kg/m., recommend weight loss, diet and exercise as appropriate   Other Active Problems  AKI Cr 1.56->1.08 - continue IVF  Hospital day # 1  I spent  35 minutes in total face-to-face time with the patient, more than 50% of which was spent in counseling and coordination of care, reviewing test results, images and medication, and discussing the diagnosis of lateral medullary syndrome, diabetes, dysphasia, hoarseness and potential respiratory  failure, treatment plan and potential prognosis. This patient's care requiresreview of multiple databases, neurological assessment, discussion with family, other specialists and medical decision making of high complexity. I had long discussion with patient and wife at bedside, updated pt current condition, treatment plan and potential prognosis. They expressed understanding and appreciation.   Marvel PlanJindong Noah Lembke, MD PhD Stroke Neurology 05/31/2019 4:42 PM   To contact Stroke Continuity provider, please refer to [email or web address]. After hours, contact General Neurology

## 2019-05-31 NOTE — Significant Event (Addendum)
7 2:42 AM on 05/31/2019 Pulmonary critical care follow-up per request of critical care PA for airway inspection. 63 year old male post stroke left medullary CVA is having difficulty managing his secretions.  He was evaluated earlier this morning and on the current evaluation he is off of sleep struggling gurgling and at risk for intubation.  Therefore we will move him to the intensive care unit for further evaluation and treatment we await a swallow evaluation.  Chest x-ray is consistent with possible aspiration. Recent Labs  Lab 05/30/19 0151  NA 132*  K 5.0  CL 95*  CO2 21*  BUN 18  CREATININE 1.56*  GLUCOSE 334*   Recent Labs  Lab 05/30/19 0151  HGB 16.2  HCT 49.4  WBC 8.9  PLT 240    BP (!) 156/84 (BP Location: Right Arm)   Pulse (!) 125   Temp 97.7 F (36.5 C) (Oral)   Resp (!) 28   Ht 6' (1.829 m)   Wt (!) 146.1 kg   SpO2 90%   BMI 43.68 kg/m    General: Morbidly obese male he is struggling with secretion clearance HEENT: Short neck, no JVD is appreciated Neuro: Follows commands moves all extremities CV: Heart sounds regular regular rate and rhythm PULM: Chronic cough with poor mucociliary clearance, possibly suction to back of his oropharynx without gag reflex. GI: soft, bsx4 active  Extremities: warm/dry, 1+ edema  Skin: no rashes or lesions   App cct 45 min  Impression plan.  Please see consult note done earlier today.  Difficulty with oral clearing of secretions.  Airway compromise.  Medium to high risk for intubation.  In the setting of a left medullary stroke.  Transfer the intensive care unit See consult note for further details Medium to high probability of requiring intubation.  Richardson Landry Minor ACNP Maryanna Shape PCCM Pager [phone] till 1 pm If no answer page [phone] 05/31/2019, 7:48 AM

## 2019-05-31 NOTE — Progress Notes (Signed)
Pt constantly suctioning; coughing and having difficulty swallowing his own secretions. CCMD NP assessed pt and transfer order was received; pt remains alert and responsive; report called off to 42M RN; pt wife Mliss Sax called and notified; attending also notified. Pt transported off unit via bed with belongings to the side by Rapid response RN and unit charge nurse. Delia Heady RN

## 2019-05-31 NOTE — Progress Notes (Signed)
Called about patient secretions and unable to clear them from airway. After given treatment of Xopenex 1.25 and no relief I was called to assess for NT Suctioning. After suctioning patient x1 down the right nare. I got a moderate amount of frothy white secretions with not much of a gag from the patient. Patient does have a strong cough, but is just not able to keep his airway clear of these secretions. Patient did show immediate relief after suctioning and stated he felt better. Had to increase his O2 from 2l to 4l and patient was sating 94% and Hr 112 when leaving the room. Assisted by Velora Heckler. RRT,RCP.

## 2019-05-31 NOTE — Progress Notes (Signed)
OT Cancellation Note  Patient Details Name: Michael Bright MRN: [id number] DOB: 1956-08-06   Cancelled Treatment:    Reason Eval/Treat Not Completed: Medical issues which prohibited therapy(Pt with difficulty breathing and managing secretions.)  Pt transferring to 66M for medical management. Pt has bedrest orders and will be evaluated by OT when medically appropriate.  Ebony Hail Harold Hedge) Marsa Aris OTR/L Acute Rehabilitation Services Pager: [phone] Office: [place] 05/31/2019, 8:56 AM

## 2019-05-31 NOTE — Significant Event (Signed)
Called to see patient regarding worsening dyspnea, new supplemental O2 requirement.   He is a 15 yom with hx of DM2, stopped medications and Dr. Visits >10 yrs ago, presented 8/10 with 1-2 days left-sided numbness and weakness and difficulty swallowing. He was found to have acute CVA involving lateral left medulla.   He has had difficulty managing secretions and has been suctioning himself, but become gradually more tachypneic and tachycardic this morning and complaining of SOB.   On exam, he is tachypneic to 30 with accessory muscle use, sat 92% on 2 Lpm. HR ~120 and regular.   No infiltrate on CXR. Echo done yesterday with >50% resp variability in IVC. COVID-19 negative.   Concerned that he is unable to manage secretions and may require intubation. RT asked to see if they can suction. Will discuss with PCCM.

## 2019-05-31 NOTE — Progress Notes (Signed)
   05/31/19 0424  Provider Notification  Provider Name/Title NP k. Schorr  Date Provider Notified 05/31/19  Time Provider Notified [phone]  Notification Type Page  Notification Reason Other (Comment) (pt c/o of constany coughing and SOB, suspected aspiration)  Response See new orders  Date of Provider Response 05/31/19  Time of Provider Response [phone]

## 2019-05-31 NOTE — Progress Notes (Signed)
Pt still c/o of SOB with frequent congested coughing, HR in the 120s, pt observed to be having difficulty talking, respiration 28, pt is currently on 2LNC oxygen sating at 95-95%, NP Chaney Malling re-paged, said will send a physician to come see pt as she has another emergency to attend at this moment, will however continue to monitor. [name], Rena Hunke Efe

## 2019-06-01 ENCOUNTER — Inpatient Hospital Stay (HOSPITAL_COMMUNITY): Payer: BC Managed Care – PPO

## 2019-06-01 DIAGNOSIS — G463 Brain stem stroke syndrome: Secondary | ICD-10-CM

## 2019-06-01 DIAGNOSIS — I639 Cerebral infarction, unspecified: Secondary | ICD-10-CM

## 2019-06-01 LAB — CBC WITH DIFFERENTIAL/PLATELET
Abs Immature Granulocytes: 0.05 10*3/uL (ref 0.00–0.07)
Basophils Absolute: 0 10*3/uL (ref 0.0–0.1)
Basophils Relative: 0 %
Eosinophils Absolute: 0 10*3/uL (ref 0.0–0.5)
Eosinophils Relative: 0 %
HCT: 43.8 % (ref 39.0–52.0)
Hemoglobin: 14.3 g/dL (ref 13.0–17.0)
Immature Granulocytes: 0 %
Lymphocytes Relative: 13 %
Lymphs Abs: 1.8 10*3/uL (ref 0.7–4.0)
MCH: 30.2 pg (ref 26.0–34.0)
MCHC: 32.6 g/dL (ref 30.0–36.0)
MCV: 92.4 fL (ref 80.0–100.0)
Monocytes Absolute: 1.2 10*3/uL — ABNORMAL HIGH (ref 0.1–1.0)
Monocytes Relative: 9 %
Neutro Abs: 10.5 10*3/uL — ABNORMAL HIGH (ref 1.7–7.7)
Neutrophils Relative %: 78 %
Platelets: 222 10*3/uL (ref 150–400)
RBC: 4.74 MIL/uL (ref 4.22–5.81)
RDW: 11.9 % (ref 11.5–15.5)
WBC: 13.5 10*3/uL — ABNORMAL HIGH (ref 4.0–10.5)
nRBC: 0 % (ref 0.0–0.2)

## 2019-06-01 LAB — GLUCOSE, CAPILLARY
Glucose-Capillary: 168 mg/dL — ABNORMAL HIGH (ref 70–99)
Glucose-Capillary: 181 mg/dL — ABNORMAL HIGH (ref 70–99)
Glucose-Capillary: 190 mg/dL — ABNORMAL HIGH (ref 70–99)
Glucose-Capillary: 190 mg/dL — ABNORMAL HIGH (ref 70–99)
Glucose-Capillary: 190 mg/dL — ABNORMAL HIGH (ref 70–99)
Glucose-Capillary: 214 mg/dL — ABNORMAL HIGH (ref 70–99)

## 2019-06-01 LAB — PHOSPHORUS
Phosphorus: 3.1 mg/dL (ref 2.5–4.6)
Phosphorus: 3.1 mg/dL (ref 2.5–4.6)

## 2019-06-01 LAB — BASIC METABOLIC PANEL
Anion gap: 10 (ref 5–15)
BUN: 18 mg/dL (ref 8–23)
CO2: 22 mmol/L (ref 22–32)
Calcium: 8.2 mg/dL — ABNORMAL LOW (ref 8.9–10.3)
Chloride: 105 mmol/L (ref 98–111)
Creatinine, Ser: 1.02 mg/dL (ref 0.61–1.24)
GFR calc Af Amer: >60 mL/min (ref >60)
GFR calc non Af Amer: >60 mL/min (ref >60)
Glucose, Bld: 225 mg/dL — ABNORMAL HIGH (ref 70–99)
Potassium: 4.5 mmol/L (ref 3.5–5.1)
Sodium: 137 mmol/L (ref 135–145)

## 2019-06-01 LAB — LIPID PANEL
Cholesterol: 196 mg/dL (ref 0–200)
HDL: 50 mg/dL (ref >40)
LDL Cholesterol: 135 mg/dL — ABNORMAL HIGH (ref 0–99)
Total CHOL/HDL Ratio: 3.9 RATIO
Triglycerides: 53 mg/dL (ref <150)
VLDL: 11 mg/dL (ref 0–40)

## 2019-06-01 LAB — MAGNESIUM
Magnesium: 2.6 mg/dL — ABNORMAL HIGH (ref 1.7–2.4)
Magnesium: 2.5 mg/dL — ABNORMAL HIGH (ref 1.7–2.4)

## 2019-06-01 MED ORDER — IOHEXOL 300 MG/ML  SOLN
10.0000 mL | Freq: Once | INTRAMUSCULAR | Status: AC | PRN
  Administered 2019-06-01: 10 mL

## 2019-06-01 MED ORDER — ATORVASTATIN CALCIUM 40 MG PO TABS
40.0000 mg | ORAL_TABLET | Freq: Every day | ORAL | Status: DC
  Administered 2019-06-02 – 2019-06-05 (×4): 40 mg via ORAL
  Filled 2019-06-01 (×4): qty 1

## 2019-06-01 MED ORDER — GLUCERNA 1.2 CAL PO LIQD
1000.0000 mL | ORAL | Status: DC
  Administered 2019-06-01: 1000 mL
  Filled 2019-06-01 (×4): qty 1000

## 2019-06-01 MED ORDER — PRO-STAT SUGAR FREE PO LIQD
30.0000 mL | Freq: Two times a day (BID) | ORAL | Status: DC
  Administered 2019-06-01 – 2019-06-03 (×4): 30 mL
  Filled 2019-06-01 (×5): qty 30

## 2019-06-01 MED ORDER — CLOPIDOGREL BISULFATE 75 MG PO TABS
75.0000 mg | ORAL_TABLET | Freq: Every day | ORAL | Status: DC
  Administered 2019-06-02: 75 mg via ORAL
  Filled 2019-06-01 (×2): qty 1

## 2019-06-01 MED ORDER — LOSARTAN POTASSIUM 50 MG PO TABS: 50.0000 mg | ORAL_TABLET | Freq: Every day | ORAL | Status: DC

## 2019-06-01 MED ORDER — ASPIRIN 81 MG PO CHEW
81.0000 mg | CHEWABLE_TABLET | Freq: Every day | ORAL | Status: DC
  Administered 2019-06-02: 81 mg via ORAL
  Filled 2019-06-01 (×2): qty 1

## 2019-06-01 NOTE — Progress Notes (Signed)
Rehab Admissions Coordinator Note:  Patient was screened by Michel Santee for appropriateness for an Inpatient Acute Rehab Consult.  At this time, we are recommending Inpatient Rehab consult.  Please place consult order if pt would like to be considered.   Michel Santee 06/01/2019, 10:45 AM  I can be reached at [phone].

## 2019-06-01 NOTE — Progress Notes (Signed)
NAME:  Michael Bright, MRN:  [id number], DOB:  1956/09/09, LOS: 2 ADMISSION DATE:  05/30/2019, CONSULTATION DATE:  05/31/19 REFERRING MD:  Antionette Charpyd  CHIEF COMPLAINT:  Secretions   Brief History   Michael Bright is a 63 y.o. 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  PCCM asked to evaluate early AM 8/12 for poor secretion management.  History of present illness   Michael Bright is a 63 y.o. male who has a PMH including but not limited to DM, HTN.  He apparently stopped taking medications and / or seeing health care providers over 10 years ago.   [facility] with LLE weakness and numnbess that started 8/9.  Also had dysphagia, L facial numbness, double vision.  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  [facility] neurologic evaluation.  [facility]  Early AM 6/12, he had moment of increased RR with increase in oral secretions.  He required suctioning by RT due to inability to clear secretions.  He was also noted to have accessory muscle usage; therefore, PCCm asked to evaluate.  At the time of our evaluation, he did have secretions; however, work of breathing had significantly improved.  He was able to speak in full sentences.  He did require assistance with oral suctioning (he has been using yankaur most of the night; however, required assistance getting it farther back into posterior oropharynx).  Does have strong cough, weak gag.  Past Medical History  DM, HTN.  Significant Hospital Events   8/11 > admit. 8/12 > PCCM consult.  Consults:  Neuro. PCCM.  Procedures:  None.  Significant Diagnostic Tests:  CT head 8/11 > neg. MRI brain 8/11 > small left medulla infarct. Renal US 8/11 > neg. Echo 8/11 > EF 60-65%. CXR 8/12 > atx.  Micro Data:  SARS CoV2 8/11 > neg.  Antimicrobials:  None.   Interim history/subjective:  Not in acute distress.  But continues to have hoarseness copious secretions in  approximately 300 cc material suctioned from oropharynx over 24-hour  Objective:  Blood pressure (!) 168/144, pulse 97, temperature 98.1 F (36.7 C), temperature source Oral, resp. rate 16, height 6' (1.829 m), weight (!) 146.1 kg, SpO2 95 %.        Intake/Output Summary (Last 24 hours) at 06/01/2019 0846 Last data filed at 06/01/2019 0600 Gross per 24 hour  Intake 1899.27 ml  Output 600 ml  Net 1299.27 ml   Filed Weights   05/30/19 2100  Weight: (!) 146.1 kg    Examination: General: Morbidly obese male in no acute distress HEENT: Hoarse voice.  Some right-sided weakness is appreciated.  Pupils equal react to light Neuro: Right-sided weakness is noted CV: Heart sounds are regular PULM: Coarse rhonchi decreased breath sounds in the base.  Prior frequent oral and nasotracheal suctioning GI: soft, bsx4 active  Extremities: warm/dry, 1+ edema  Skin: no rashes or lesions   Assessment & Plan:   L medulla CVA. Neurology is following  Dysphagia with oral secretions - able to self suction; though occasionally needing assistance to get into posterior oropharynx / NTS. Transferred to intensive care unit on 05/30/2021 for closer observation Core track to meet inserted 06/01/2019 Still requires frequent oral suctioning and nasotracheal suctioning Continue to monitor may be ready transfer back to floor in the next 12 to 24 hours Tube feedings   DM2. CBG (last 3)  Recent Labs  05/31/19 1920 05/31/19 2323 06/01/19 0347  GLUCAP 181* 189* 214*   Sliding scale insulin protocol Will need increasing coverage with a initiation of tube feedings    Best Practice:  Diet: NPO.  06/01/2019 plan for core tract Pain/Anxiety/Delirium protocol (if indicated): N/A. VAP protocol (if indicated): N/A. DVT prophylaxis: SCD's / Enoxaparin. GI prophylaxis:  Glucose control: SSI. Mobility: Bedrest. Code Status: Full. Family Communication: None available. Disposition: Tele.  Labs   CBC:  Recent Labs  Lab 05/30/19 0151 06/01/19 0314  WBC 8.9 13.5*  NEUTROABS 6.2 10.5*  HGB 16.2 14.3  HCT 49.4 43.8  MCV 91.7 92.4  PLT 240 017   Basic Metabolic Panel: Recent Labs  Lab 05/30/19 0151 05/31/19 0807 06/01/19 0314  NA 132* 136 137  K 5.0 4.1 4.5  CL 95* 101 105  CO2 21* 25 22  GLUCOSE 334* 206* 225*  BUN 18 21 18   CREATININE 1.56* 1.08 1.02  CALCIUM 9.1 8.9 8.2*  MG  --  2.4  --   PHOS  --  4.2  --    GFR: Estimated Creatinine Clearance: 112.9 mL/min (by C-G formula based on SCr of 1.02 mg/dL). Recent Labs  Lab 05/30/19 0151 06/01/19 0314  WBC 8.9 13.5*   Liver Function Tests: Recent Labs  Lab 05/30/19 0151 05/31/19 0807  AST 29 28  ALT 30 31  ALKPHOS 77 82  BILITOT 1.2 1.0  PROT 8.5* 7.5  ALBUMIN 4.2 3.7   No results for input(s): LIPASE, AMYLASE in the last 168 hours. No results for input(s): AMMONIA in the last 168 hours. ABG    Component Value Date/Time   TCO2 21 01/20/2008 1238    Coagulation Profile: Recent Labs  Lab 05/30/19 0151  INR 1.0   Cardiac Enzymes: Recent Labs  Lab 05/30/19 1122  CKTOTAL 317   HbA1C: Hgb A1c MFr Bld  Date/Time Value Ref Range Status  05/30/2019 11:22 AM 10.6 (H) 4.8 - 5.6 % Final    Comment:    (NOTE) Pre diabetes:          5.7%-6.4% Diabetes:              >6.4% Glycemic control for   <7.0% adults with diabetes    CBG: Recent Labs  Lab 05/31/19 1121 05/31/19 1555 05/31/19 1920 05/31/19 2323 06/01/19 0347  GLUCAP 197* 192* 181* 189* 214*     App cct 30 min  Richardson Landry Minor ACNP Maryanna Shape PCCM Pager [phone] till 1 pm If no answer page [phone] 06/01/2019, 8:46 AM

## 2019-06-01 NOTE — Progress Notes (Signed)
Inpatient Rehab Admissions:  Inpatient Rehab Consult received.  I met with pt and his wife at the bedside for rehabilitation assessment and to discuss goals and expectations of an inpatient rehab admission.  Pt and wife very interested in [place]. Wife is willing to take FMLA as needed to support a CIR stay. Feel pt is a great candidate for CIR at this time. AC will follow for medical stability and readiness for rehab. Will need to get approval from insurance once medically ready. Will follow.   Jhonnie Garner, OTR/L  Rehab Admissions Coordinator  [phone] 06/01/2019 2:40 PM

## 2019-06-01 NOTE — Evaluation (Signed)
Physical Therapy Evaluation Patient Details Name: Michael Bright MRN: [id number] DOB: 1955/11/24 Today's Date: 06/01/2019   History of Present Illness  Pt adm with lt sided weakness and numbness. Pt with lt medullary infarct. PMH -  DM, HTN  Clinical Impression  Pt presents to PT with significant decr in mobility due to lt sided weakness and decr balance. Pt very motivated to work toward independence. Feel pt is an excellent candidate for CIR to maximize independence and safety prior to return home.     Follow Up Recommendations CIR    Equipment Recommendations  Other (comment)(To be determined)    Recommendations for Other Services       Precautions / Restrictions Precautions Precautions: Fall      Mobility  Bed Mobility Overal bed mobility: Needs Assistance Bed Mobility: Supine to Sit     Supine to sit: Min guard;HOB elevated     General bed mobility comments: Incr time and use of rail. Assist for safety and lines  Transfers Overall transfer level: Needs assistance Equipment used: Rolling walker (2 wheeled);1 person hand held assist Transfers: Sit to/from [employer] CorporationStand;Stand Pivot Transfers Sit to Stand: Mod assist;Min assist Stand pivot transfers: +2 physical assistance;Mod assist       General transfer comment: When coming to stand assist for balance with pt tending to lean lt. Assist with stand pivot with walker for balance and with LLE with poor control and high risk to buckle  Ambulation/Gait             General Gait Details: Limited to pivotal steps to chair  Stairs            Wheelchair Mobility    Modified Rankin (Stroke Patients Only) Modified Rankin (Stroke Patients Only) Pre-Morbid Rankin Score: No symptoms Modified Rankin: Severe disability     Balance Overall balance assessment: Needs assistance Sitting-balance support: No upper extremity supported;Feet supported Sitting balance-Leahy Scale: Fair     Standing balance support:  Bilateral upper extremity supported Standing balance-Leahy Scale: Poor Standing balance comment: static standing with walker with min assist                             Pertinent Vitals/Pain Pain Assessment: No/denies pain    Home Living Family/patient expects to be discharged to:: Private residence Living Arrangements: Spouse/significant other Available Help at Discharge: Family;Available PRN/intermittently Type of Home: House Home Access: Ramped entrance     Home Layout: Two level;Able to live on main level with bedroom/bathroom Home Equipment: Walker - 2 wheels(was father's walker)      Prior Function Level of Independence: Independent               Hand Dominance   Dominant Hand: Right    Extremity/Trunk Assessment   Upper Extremity Assessment Upper Extremity Assessment: Defer to OT evaluation    Lower Extremity Assessment Lower Extremity Assessment: LLE deficits/detail LLE Deficits / Details: Test 4/5 but functionally weaker and diffculty supporting pt LLE Sensation: decreased light touch(Pt reports hx of numbness lateral leg due to back injury) LLE Coordination: decreased gross motor       Communication   Communication: Other (comment)(low volume, wet)  Cognition Arousal/Alertness: Awake/alert Behavior During Therapy: WFL for tasks assessed/performed Overall Cognitive Status: Within Functional Limits for tasks assessed  General Comments General comments (skin integrity, edema, etc.): Removed O2 initially with SpO2 dropping to 89%. Replaced O2.    Exercises     Assessment/Plan    PT Assessment Patient needs continued PT services  PT Problem List Decreased strength;Decreased balance;Decreased mobility;Decreased coordination;Decreased knowledge of use of DME;Obesity;Impaired sensation       PT Treatment Interventions DME instruction;Gait training;Functional mobility  training;Therapeutic activities;Therapeutic exercise;Balance training;Neuromuscular re-education;Patient/family education    PT Goals (Current goals can be found in the Care Plan section)  Acute Rehab PT Goals Patient Stated Goal: to be able to walk PT Goal Formulation: With patient Time For Goal Achievement: 06/15/19 Potential to Achieve Goals: Good    Frequency Min 4X/week   Barriers to discharge Decreased caregiver support wife works during the day    Co-evaluation               AM-PAC PT "6 Clicks" Mobility  Outcome Measure Help needed turning from your back to your side while in a flat bed without using bedrails?: None Help needed moving from lying on your back to sitting on the side of a flat bed without using bedrails?: A Little Help needed moving to and from a bed to a chair (including a wheelchair)?: A Lot Help needed standing up from a chair using your arms (e.g., wheelchair or bedside chair)?: A Little Help needed to walk in hospital room?: Total Help needed climbing 3-5 steps with a railing? : Total 6 Click Score: 14    End of Session Equipment Utilized During Treatment: Gait belt;Oxygen Activity Tolerance: Patient tolerated treatment well Patient left: in chair;with call bell/phone within reach;with chair alarm set Nurse Communication: Mobility status(Nurse assisted with transfer) PT Visit Diagnosis: Unsteadiness on feet (R26.81);Hemiplegia and hemiparesis Hemiplegia - Right/Left: Left Hemiplegia - dominant/non-dominant: Non-dominant Hemiplegia - caused by: Cerebral infarction    Time: 0927-0955 PT Time Calculation (min) (ACUTE ONLY): 28 min   Charges:   PT Evaluation $PT Eval Moderate Complexity: 1 Mod PT Treatments $Therapeutic Activity: 8-22 mins        [place] Pager [phone] Office [place] 06/01/2019, 10:14 AM

## 2019-06-01 NOTE — Progress Notes (Signed)
STROKE TEAM PROGRESS NOTE   INTERVAL HISTORY Pt wife at bedside. Pt sitting in chair, just had cortrak placed. He stated that his secretions are better but still has intermittent cough. Will start tube feeding.    Vitals:   06/01/19 0600 06/01/19 0700 06/01/19 0800 06/01/19 0900  BP: (!) 163/81 (!) 166/74 (!) 168/144 (!) 181/88  Pulse: 80 96 97 100  Resp: (!) 23 17 16 18   Temp:      TempSrc:      SpO2: 96% 96% 95% 93%  Weight:      Height:        CBC:  Recent Labs  Lab 05/30/19 0151 06/01/19 0314  WBC 8.9 13.5*  NEUTROABS 6.2 10.5*  HGB 16.2 14.3  HCT 49.4 43.8  MCV 91.7 92.4  PLT 240 132    Basic Metabolic Panel:  Recent Labs  Lab 05/31/19 0807 06/01/19 0314  NA 136 137  K 4.1 4.5  CL 101 105  CO2 25 22  GLUCOSE 206* 225*  BUN 21 18  CREATININE 1.08 1.02  CALCIUM 8.9 8.2*  MG 2.4  --   PHOS 4.2  --    Lipid Panel:     Component Value Date/Time   CHOL 196 06/01/2019 0314   TRIG 53 06/01/2019 0314   HDL 50 06/01/2019 0314   CHOLHDL 3.9 06/01/2019 0314   VLDL 11 06/01/2019 0314   LDLCALC 135 (H) 06/01/2019 0314   HgbA1c:  Lab Results  Component Value Date   HGBA1C 10.6 (H) 05/30/2019   Urine Drug Screen:     Component Value Date/Time   LABOPIA NONE DETECTED 05/30/2019 0151   COCAINSCRNUR NONE DETECTED 05/30/2019 0151   LABBENZ NONE DETECTED 05/30/2019 0151   AMPHETMU NONE DETECTED 05/30/2019 0151   THCU NONE DETECTED 05/30/2019 0151   LABBARB NONE DETECTED 05/30/2019 0151    Alcohol Level     Component Value Date/Time   ETH <10 05/30/2019 0151    IMAGING Dg Abd 1 View  Result Date: 06/01/2019 CLINICAL DATA:  Feeding tube placement EXAM: ABDOMEN - 1 VIEW COMPARISON:  None. FINDINGS: Feeding tube tip passes through the body of the stomach to the region of the pylorus. Small amount of contrast injected appears to fill the duodenum. Normal bowel gas pattern. IMPRESSION: Feeding tube tip in the region the pylorus, possibly proximal duodenum.  Electronically Signed   By: Franchot Gallo M.D.   On: 06/01/2019 09:51   US Renal  Result Date: 05/30/2019 CLINICAL DATA:  Renal failure. EXAM: RENAL / URINARY TRACT ULTRASOUND COMPLETE COMPARISON:  None. FINDINGS: Right Kidney: Renal measurements: 13.4 x 6.4 x 7.6 cm = volume: 339 mL . Echogenicity within normal limits. No mass or hydronephrosis visualized. Left Kidney: Renal measurements: 13.3 x 6.2 x 5.9 cm = volume: 256 mL. Echogenicity within normal limits. No mass or hydronephrosis visualized. Bladder: Appears normal for degree of bladder distention. IMPRESSION: Normal renal ultrasound. Electronically Signed   By: Titus Dubin M.D.   On: 05/30/2019 13:10   Dg Chest Port 1 View  Result Date: 05/31/2019 CLINICAL DATA:  Acute respiratory failure EXAM: PORTABLE CHEST 1 VIEW COMPARISON:  05/31/2019 FINDINGS: Cardiac shadow is within normal limits. Previously seen atelectasis is again noted in the bases. No new focal abnormality is seen. No bony abnormality is noted. IMPRESSION: Stable basilar atelectasis.  No new focal abnormality is seen. Electronically Signed   By: Inez Catalina M.D.   On: 05/31/2019 11:51   Dg Chest [facility]  Result Date: 05/31/2019 CLINICAL DATA:  Cough.  Possible aspiration. EXAM: PORTABLE CHEST 1 VIEW COMPARISON:  Yesterday FINDINGS: Low volume chest with streaky opacity at the bases. Stable cardiomegaly. No effusion or pneumothorax. IMPRESSION: 1. Low volume chest with atelectatic type opacity at the bases. 2. Cardiomegaly. Electronically Signed   By: Marnee SpringJonathon  Watts M.D.   On: 05/31/2019 05:15   2D Echocardiogram  1. The left ventricle has normal systolic function with an ejection fraction of 60-65%. The cavity size was normal. There is mildly increased left ventricular wall thickness. Left ventricular diastolic parameters were normal.  2. The right ventricle has normal systolic function. The cavity was normal. There is no increase in right ventricular wall thickness.   3. Moderate thickening of the mitral valve leaflet. Mild calcification of the mitral valve leaflet.  4. The aortic valve is tricuspid. Mild thickening of the aortic valve. Mild calcification of the aortic valve.  5. The aorta is normal in size and structure.  6. The interatrial septum was not well visualized.  PHYSICAL EXAM  Temp:  [97.9 F (36.6 C)-98.6 F (37 C)] 98.1 F (36.7 C) (08/13 0400) Pulse Rate:  [80-112] 100 (08/13 0900) Resp:  [16-41] 18 (08/13 0900) BP: (131-181)/(73-144) 181/88 (08/13 0900) SpO2:  [91 %-97 %] 93 % (08/13 0900)  General - Well nourished, well developed, in no apparent distress.  Ophthalmologic - fundi not visualized due to noncooperation.  Cardiovascular - Regular rate and rhythm.  Mental Status -  Level of arousal and orientation to time, place, and person were intact. Language including expression, naming, repetition, comprehension was assessed and found intact. However, paucity of speech due to coughing and hoarsness  Cranial Nerves II - XII - II - Visual field intact OU. III, IV, VI - Extraocular movements intact. However, vertical nystagmus with up or downward gaze. 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 VII - left mild facial droop. VIII - Hearing & vestibular intact bilaterally. X - Hoarseness and difficulty with swallowing XI - Chin turning & shoulder shrug intact bilaterally. XII - Tongue protrusion intact.  Motor Strength - The patient's strength was normal in all extremities except left pronator drift.  Bulk was normal and fasciculations were absent.   Motor Tone - Muscle tone was assessed at the neck and appendages and was normal.  Reflexes - The patient's reflexes were symmetrical in all extremities and he had no pathological reflexes.  Sensory - Light touch, temperature/pinprick were assessed and were symmetrical.     Coordination - The patient had normal movements in the hands with no ataxia or dysmetria.  Tremor was absent.  Gait and Station - deferred.   ASSESSMENT/PLAN [name] is a 63 y.o. male with hx DB who stopped meds 10 yrs ago and morbid obesity [facility] with dysarthria, left facial droop, double vision and gait unsteadiness since Sunday 8/9.   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 Mild small vessel disease.     MRI  Small lateral L medullary infarct. Small vessel disease white matter and pons.   CTA head & neck no ELVO. Diffused intracranial atherosclerosis. Mod supraclinoid B ICAs. Mild stenosis L VA origin  2D Echo EF 60-65%. No source of embolus   LDL 135  HgbA1c 10.6  Lovenox 70 mg sq daily for VTE prophylaxis  No antithrombotic prior to admission, now on aspirin 81 mg and plavix 75mg  DAPT. Continue DAPT for 3 months and then ASA alone.    Therapy recommendations:  CIR. Consult placed  Disposition:  pending   Hypoxemic Respiratory failure Hoarseness, improving  Secondary to medullary stroke  NTS prn, robinul prn  Increased secretions and tachypnea/tachycardia  transferred to ICU for close monitoring  Added scopolamine patch to decrease secretion  CCM on board  Possible Aspiration PNA  Secondary to medullary stroke  CXR LLL opacity  WBC 8.9->13.5  No abx indicated at present  CCM on board  Blood Pressure  No hx HTN  BP 160-180s in his leg  BP 120-140s in his arm  Permissive hypertension (OK if < 220/120) for 48 hours and then gradually normalized in 3-5 days.  . BP goal normotensive  Diabetes type II, new diagnosis  Home meds:  none  HgbA1c 10.6, goal < 7.0  CBGs  SSI 0-15  DB coordinator consulted  Dysphagia . Secondary to medullary stroke . Did not pass swallow . continue IVF @ 50 . Cortrak Tube  placement w/ TF today, goal rate 65cc/h . Speech on board   Other Stroke Risk Factors  ETOH use, advised to drink no more than 2 drink(s) a day  Morbid Obesity, Body mass index is 43.68 kg/m., recommend weight loss, diet and exercise as appropriate   Other Active Problems  AKI Cr 1.56->1.08 - continue IVF  Hospital day # 2  I spent  35 minutes in total face-to-face time with the patient, more than 50% of which was spent in counseling and coordination of care, reviewing test results, images and medication, and discussing the diagnosis of lateral medullary syndrome, diabetes, dysphasia, hoarseness and potential respiratory failure, treatment plan and potential prognosis. This patient's care requiresreview of multiple databases, neurological assessment, discussion with family, other specialists and medical decision making of high complexity. I had long discussion with patient and wife at bedside, updated pt current condition, treatment plan and potential prognosis. They expressed understanding and appreciation.   Marvel PlanJindong Chaske Paskett, MD PhD Stroke Neurology 06/01/2019 12:44 PM   To contact Stroke Continuity provider, please refer to [email or web address]. After hours, contact General Neurology

## 2019-06-01 NOTE — Progress Notes (Signed)
SLP Cancellation Note  Patient Details Name: Dontrell Stuck MRN: [id number] DOB: 1956/09/15   Cancelled treatment:       Reason Eval/Treat Not Completed: Patient at procedure or test/unavailable : Pt with therapy, then Xray upon attempts to see.  Will continue efforts.   Juan Quam Laurice 06/01/2019, 3:50 PM

## 2019-06-01 NOTE — Progress Notes (Signed)
 Initial Nutrition Assessment  DOCUMENTATION CODES:   Morbid obesity  INTERVENTION:   Tube Feeding:  Glucerna 1.2 at 65 ml/hr Pro-Stat 30 mL BID Provides 124 g of protein, 2072 kcals and 1256 mL of free water Meets 100% estimated calorie and protein needs   NUTRITION DIAGNOSIS:   Inadequate oral intake related to dysphagia as evidenced by NPO status.  GOAL:   Patient will meet greater than or equal to 90% of their needs  MONITOR:   TF tolerance, Diet advancement, Skin, Labs, Weight trends  REASON FOR ASSESSMENT:   Consult Enteral/tube feeding initiation and management  ASSESSMENT:   63 yo male admitted on 8/11 with L medulla CVA with dysphagia. PMH includes DM, HTN   8/11 Admit 8/12 Transferred to [place] for respiratory decompensation, unable to clear secretions 8/13 Cortrak inserted by Diagnostics, TF initiated  Pt is alert and oriented, sitting up in chair Pt with significant dysphagia, difficulty managing secretions but able to suction self.   Pt remains NPO Cortrak inserted today by [facility]; tip in the pylorus, possibly proximal duodenum  No previous weight encounters  Lab Results  Component Value Date   HGBA1C 10.6 (H) 05/30/2019   Labs: CBGs 181-214 Meds: NS at 100 ml/hr    Diet Order:   Diet Order            Diet NPO time specified  Diet effective now              EDUCATION NEEDS:   Not appropriate for education at this time  Skin:  Skin Assessment: Reviewed RN Assessment  Last BM:  8/12  Height:   Ht Readings from Last 1 Encounters:  05/30/19 6' (1.829 m)    Weight:   Wt Readings from Last 1 Encounters:  05/30/19 (!) 146.1 kg    Ideal Body Weight:  80.9 kg  BMI:  Body mass index is 43.68 kg/m.  Estimated Nutritional Needs:   Kcal:  2020-2265 kcals  Protein:  110-140 g  Fluid:  >/= 2L    Rico Junker MS, RDN, LDN, CNSC [phone] Pager  [phone] Weekend/On-Call Pager

## 2019-06-01 NOTE — Progress Notes (Signed)
Inpatient Diabetes Program Recommendations  AACE/ADA: New Consensus Statement on Inpatient Glycemic Control (2015)  Target Ranges:  Prepandial:   less than 140 mg/dL      Peak postprandial:   less than 180 mg/dL (1-2 hours)      Critically ill patients:  140 - 180 mg/dL   Lab Results  Component Value Date   GLUCAP 190 (H) 06/01/2019   HGBA1C 10.6 (H) 05/30/2019    Review of Glycemic Control  New diagnosis of diabetes.  Current orders for Inpatient glycemic control: Novolog 0-9 units Q4H  HgbA1C - 10.6%  Inpatient Diabetes Program Recommendations:     Increase Novolog to 0-15 units Q4H. May need basal insulin at some point.  Will follow closely.  Thank you. Lorenda Peck, RD, LDN, CDE Inpatient Diabetes Coordinator [phone]

## 2019-06-01 NOTE — Evaluation (Signed)
Occupational Therapy Evaluation Patient Details Name: Michael Bright MRN: [id number] DOB: 09-30-1956 Today's Date: 06/01/2019    History of Present Illness Pt adm with lt sided weakness and numbness. Pt with lt medullary infarct. 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 Wife present and supportive throughout session. PTA pt was fully ind community dwelling adult- has been on disability for 20 years and mostly watching grandkids at baseline. At time of evaluation pt is min-mod A for balance in standing, he is able to power up well with BUE strength but has difficulties coordinating/buckling LLE for safe and functional transfer without cueing. Pt also experiencing difficulty clearing secretions, ind with mouth suction device and needing deep suction from RN x1 during session. Some difficulty with smooth visual tracking noted (see below for details). At this time recommend CIR at d/c for intensive neurological rehab to facilitate safety and ind prior to d/c. Will continue to follow per POC listed below.     Follow Up Recommendations  CIR    Equipment Recommendations  Other (comment)(defer to next venue)    Recommendations for Other Services       Precautions / Restrictions Precautions Precautions: Fall Precaution Comments: coretrak; suction needs Restrictions Weight Bearing Restrictions: No      Mobility Bed Mobility               General bed mobility comments: up in chair  Transfers Overall transfer level: Needs assistance Equipment used: Rolling walker (2 wheeled) Transfers: Sit to/from [employer]GI CorporationStand;Stand Pivot Transfers Sit to Stand: Mod assist;Min assist         General transfer comment: pt does well to power up given strength in BUEs, needing min-mod for balance correction and coordination  of LLE/sequencing    Balance Overall balance assessment: Needs assistance Sitting-balance support: No upper extremity supported;Feet supported Sitting balance-Leahy Scale: Fair     Standing balance support: Bilateral upper extremity supported Standing balance-Leahy Scale: Poor Standing balance comment: reliant on external support                           ADL either performed or assessed with clinical judgement   ADL Overall ADL's : Needs assistance/impaired Eating/Feeding: NPO Eating/Feeding Details (indicate cue type and reason): coretrak Grooming: Set up;Sitting Grooming Details (indicate cue type and reason): min A standing for balance Upper Body Bathing: Set up;Sitting   Lower Body Bathing: Moderate assistance;Sit to/from stand;Sitting/lateral leans   Upper Body Dressing : Set up;Sitting   Lower Body Dressing: Moderate assistance;Sit to/from stand;Sitting/lateral leans   Toilet Transfer: Moderate assistance;BSC;RW Toilet Transfer Details (indicate cue type and reason): increased assist for further transfer due to LLE weakness Toileting- Clothing Manipulation and Hygiene: Minimal assistance;Sit to/from stand;Sitting/lateral lean   Tub/ Shower Transfer: Moderate assistance;Shower seat;Rolling walker   Functional mobility during ADLs: Minimal assistance;Moderate assistance;Rolling walker General ADL Comments: pt overall limited 2/2 LLE weakness and coordination as well as difficulty clearing secretions     Vision Baseline Vision/History: Wears glasses Wears Glasses: Distance only Patient Visual Report: No change from baseline Vision Assessment?: Yes Tracking/Visual Pursuits: Decreased smoothness of horizontal tracking;Decreased smoothness of eye movement to RIGHT superior field;Decreased smoothness of eye movement to RIGHT inferior field Additional Comments: had diplopia on admin, since improved     Perception     Praxis  Pertinent Vitals/Pain Pain  Assessment: No/denies pain     Hand Dominance     Extremity/Trunk Assessment Upper Extremity Assessment Upper Extremity Assessment: Overall WFL for tasks assessed(5/5, very strong; mild coord deficits)   Lower Extremity Assessment Lower Extremity Assessment: Defer to PT evaluation       Communication Communication Communication: Other (comment)(low volume)   Cognition Arousal/Alertness: Awake/alert Behavior During Therapy: WFL for tasks assessed/performed Overall Cognitive Status: Within Functional Limits for tasks assessed                                     General Comments       Exercises     Shoulder Instructions      Home Living Family/patient expects to be discharged to:: Private residence Living Arrangements: Spouse/significant other Available Help at Discharge: Family;Available PRN/intermittently Type of Home: House Home Access: Ramped entrance     Home Layout: Two level;Able to live on main level with bedroom/bathroom Alternate Level Stairs-Number of Steps: basement, but does not go in   [employer] Shower/Tub: Tub/shower unit   Bathroom Toilet: Handicapped height     Home Equipment: Environmental consultant - 2 wheels;Tub bench   Additional Comments: handicap accessible home from parents that are now deceased      Prior Functioning/Environment Level of Independence: Independent                 OT Problem List: Decreased strength;Decreased knowledge of use of DME or AE;Decreased coordination;Decreased activity tolerance;Cardiopulmonary status limiting activity;Impaired balance (sitting and/or standing)      OT Treatment/Interventions: Self-care/ADL training;Therapeutic exercise;Patient/family education;Neuromuscular education;Balance training;Energy conservation;Therapeutic activities;DME and/or AE instruction    OT Goals(Current goals can be found in the care plan section) Acute Rehab OT Goals Patient Stated Goal: to return to baseline ind OT  Goal Formulation: With patient Time For Goal Achievement: 06/15/19  OT Frequency: Min 2X/week   Barriers to D/C:            Co-evaluation              AM-PAC OT "6 Clicks" Daily Activity     Outcome Measure Help from another person eating meals?: Total(NPO) Help from another person taking care of personal grooming?: A Little Help from another person toileting, which includes using toliet, bedpan, or urinal?: A Lot Help from another person bathing (including washing, rinsing, drying)?: A Lot Help from another person to put on and taking off regular upper body clothing?: A Little Help from another person to put on and taking off regular lower body clothing?: A Lot 6 Click Score: 13   End of Session Equipment Utilized During Treatment: Gait belt;Rolling walker Nurse Communication: Mobility status;Other (comment)(deep suction)  Activity Tolerance: Patient tolerated treatment well Patient left: in bed;with call bell/phone within reach;with family/visitor present  OT Visit Diagnosis: Unsteadiness on feet (R26.81);Other abnormalities of gait and mobility (R26.89);Muscle weakness (generalized) (M62.81);History of falling (Z91.81);Other symptoms and signs involving the nervous system (R29.898)                Time: 1334-1401 OT Time Calculation (min): 27 min Charges:  OT General Charges $OT Visit: 1 Visit OT Evaluation $OT Eval Moderate Complexity: 1 Mod OT Treatments $Self Care/Home Management : 8-22 mins  Zenovia Jarred, MSOT, OTR/L Behavioral Health OT/ Acute Relief OT [facility] Office: [phone]   Zenovia Jarred 06/01/2019, 2:20 PM

## 2019-06-01 NOTE — Progress Notes (Signed)
Pt. newly scheduled to get Cozaar PO, however SBP 120s-140s. Neurology notified and verbal orders given to hold. Spoke with Miachel Roux, NP.

## 2019-06-02 ENCOUNTER — Inpatient Hospital Stay (HOSPITAL_COMMUNITY): Payer: BC Managed Care – PPO

## 2019-06-02 LAB — BASIC METABOLIC PANEL
Anion gap: 8 (ref 5–15)
BUN: 13 mg/dL (ref 8–23)
CO2: 29 mmol/L (ref 22–32)
Calcium: 8.5 mg/dL — ABNORMAL LOW (ref 8.9–10.3)
Chloride: 103 mmol/L (ref 98–111)
Creatinine, Ser: 0.79 mg/dL (ref 0.61–1.24)
GFR calc Af Amer: >60 mL/min (ref >60)
GFR calc non Af Amer: >60 mL/min (ref >60)
Glucose, Bld: 176 mg/dL — ABNORMAL HIGH (ref 70–99)
Potassium: 4.4 mmol/L (ref 3.5–5.1)
Sodium: 140 mmol/L (ref 135–145)

## 2019-06-02 LAB — BLOOD GAS, ARTERIAL
Acid-Base Excess: 7.6 mmol/L — ABNORMAL HIGH (ref 0.0–2.0)
Bicarbonate: 33.1 mmol/L — ABNORMAL HIGH (ref 20.0–28.0)
Drawn by: 51191
O2 Content: 3 L/min
O2 Saturation: 88.9 %
Patient temperature: 100.1
pCO2 arterial: 62.9 mmHg — ABNORMAL HIGH (ref 32.0–48.0)
pH, Arterial: 7.346 — ABNORMAL LOW (ref 7.350–7.450)
pO2, Arterial: 59.6 mmHg — ABNORMAL LOW (ref 83.0–108.0)

## 2019-06-02 LAB — GLUCOSE, CAPILLARY
Glucose-Capillary: 167 mg/dL — ABNORMAL HIGH (ref 70–99)
Glucose-Capillary: 162 mg/dL — ABNORMAL HIGH (ref 70–99)
Glucose-Capillary: 202 mg/dL — ABNORMAL HIGH (ref 70–99)
Glucose-Capillary: 202 mg/dL — ABNORMAL HIGH (ref 70–99)
Glucose-Capillary: 223 mg/dL — ABNORMAL HIGH (ref 70–99)

## 2019-06-02 LAB — CBC
HCT: 44.7 % (ref 39.0–52.0)
Hemoglobin: 14.2 g/dL (ref 13.0–17.0)
MCH: 30.1 pg (ref 26.0–34.0)
MCHC: 31.8 g/dL (ref 30.0–36.0)
MCV: 94.7 fL (ref 80.0–100.0)
Platelets: 224 10*3/uL (ref 150–400)
RBC: 4.72 MIL/uL (ref 4.22–5.81)
RDW: 11.9 % (ref 11.5–15.5)
WBC: 10.6 10*3/uL — ABNORMAL HIGH (ref 4.0–10.5)
nRBC: 0 % (ref 0.0–0.2)

## 2019-06-02 LAB — PHOSPHORUS
Phosphorus: 3 mg/dL (ref 2.5–4.6)
Phosphorus: 2.9 mg/dL (ref 2.5–4.6)

## 2019-06-02 LAB — MAGNESIUM
Magnesium: 2.6 mg/dL — ABNORMAL HIGH (ref 1.7–2.4)
Magnesium: 2.5 mg/dL — ABNORMAL HIGH (ref 1.7–2.4)

## 2019-06-02 NOTE — Progress Notes (Signed)
Physical Therapy Treatment Patient Details Name: Michael Bright MRN: [id number] DOB: 09-19-1956 Today's Date: 06/02/2019    History of Present Illness Pt adm with lt sided weakness and numbness. Pt with lt medullary infarct. PMH -  DM, HTN    PT Comments    Pt making steady progress. Pt remains excellent CIR candidate.    Follow Up Recommendations  CIR     Equipment Recommendations  Other (comment)(To be determined)    Recommendations for Other Services       Precautions / Restrictions Precautions Precautions: Fall    Mobility  Bed Mobility Overal bed mobility: Needs Assistance Bed Mobility: Supine to Sit     Supine to sit: Min guard;HOB elevated     General bed mobility comments: Incr time and use of rail. Assist for safety and lines  Transfers Overall transfer level: Needs assistance Equipment used: Rolling walker (2 wheeled);1 person hand held assist Transfers: Sit to/from [employer] CorporationStand;Stand Pivot Transfers Sit to Stand: Min assist;+2 physical assistance Stand pivot transfers: +2 physical assistance;Mod assist       General transfer comment: Assist to bring hips up and for balance for sit to stand. Verbal cues for hand placement. Stand pivot to rt with walker with assist for trunk and LLE control. Pt takes pivotal steps with walker but tries to move to fast and trunk with heavy lean to rt.   Ambulation/Gait Ambulation/Gait assistance: +2 physical assistance;Mod assist Gait Distance (Feet): 3 Feet(x 2) Assistive device: Rolling walker (2 wheeled) Gait Pattern/deviations: Step-through pattern;Decreased weight shift to left;Ataxic(Decr control LLE and decr trunk control) Gait velocity: decr Gait velocity interpretation: <1.31 ft/sec, indicative of household ambulator General Gait Details: Assist for trunk and LLE control. Pt poor placement of LLE. Verbal cues not to step past walker. Followed closely with chair.   Stairs             Wheelchair Mobility     Modified Rankin (Stroke Patients Only) Modified Rankin (Stroke Patients Only) Pre-Morbid Rankin Score: No symptoms Modified Rankin: Moderately severe disability     Balance Overall balance assessment: Needs assistance Sitting-balance support: No upper extremity supported;Feet supported Sitting balance-Leahy Scale: Fair     Standing balance support: Bilateral upper extremity supported Standing balance-Leahy Scale: Poor Standing balance comment: static standing with walker with min assist                            Cognition Arousal/Alertness: Awake/alert Behavior During Therapy: Impulsive Overall Cognitive Status: Within Functional Limits for tasks assessed                                        Exercises      General Comments        Pertinent Vitals/Pain Pain Assessment: No/denies pain    Home Living                      Prior Function            PT Goals (current goals can now be found in the care plan section) Acute Rehab PT Goals Patient Stated Goal: to be able to walk Progress towards PT goals: Progressing toward goals    Frequency    Min 4X/week      PT Plan Current plan remains appropriate    Co-evaluation  AM-PAC PT "6 Clicks" Mobility   Outcome Measure  Help needed turning from your back to your side while in a flat bed without using bedrails?: None Help needed moving from lying on your back to sitting on the side of a flat bed without using bedrails?: A Little Help needed moving to and from a bed to a chair (including a wheelchair)?: A Lot Help needed standing up from a chair using your arms (e.g., wheelchair or bedside chair)?: A Little Help needed to walk in hospital room?: Total Help needed climbing 3-5 steps with a railing? : Total 6 Click Score: 14    End of Session Equipment Utilized During Treatment: Gait belt;Oxygen Activity Tolerance: Patient tolerated treatment  well Patient left: in chair;with call bell/phone within reach;with chair alarm set;with family/visitor present Nurse Communication: Mobility status PT Visit Diagnosis: Unsteadiness on feet (R26.81);Hemiplegia and hemiparesis Hemiplegia - Right/Left: Left Hemiplegia - dominant/non-dominant: Non-dominant Hemiplegia - caused by: Cerebral infarction     Time: 8469-6295 PT Time Calculation (min) (ACUTE ONLY): 32 min  Charges:  $Therapeutic Activity: 23-37 mins                     [place] Pager [phone] Office [place] 06/02/2019, 1:29 PM

## 2019-06-02 NOTE — Procedures (Signed)
Cortrak  Person Inserting Tube:  Dajon Rowe, RD Tube Type:  Cortrak - 43 inches Tube Location:  Right nare Initial Placement:  Stomach Secured by: Bridle Technique Used to Measure Tube Placement:  Documented cm marking at nare/ corner of mouth Cortrak Secured At:  64 cm   No x-ray is required. RN may begin using tube.   If the tube becomes dislodged please keep the tube and contact the Cortrak team at [email or web address] (password [email or web address]) for replacement.  If after hours and replacement cannot be delayed, place a NG tube and confirm placement with an abdominal x-ray.    Usbaldo Pannone RD, LDN Clinical Nutrition Pager # - [phone]    

## 2019-06-02 NOTE — Progress Notes (Signed)
STROKE TEAM PROGRESS NOTE   INTERVAL HISTORY Pt sitting in chair. Wife and RN at bedside. Pt pulled off cortrak last night and was replaced this am. On tube feeding and IVF. Still has intermittent coughing. Still has hoarseness voice. Stable to transfer out of ICU.    Vitals:   06/02/19 0500 06/02/19 0600 06/02/19 0700 06/02/19 0800  BP: (!) 161/88 (!) 152/83 (!) 149/76 (!) 143/76  Pulse: (!) 101 94 97 93  Resp: 18 (!) 24 (!) 25 20  Temp:    98.4 F (36.9 C)  TempSrc:    Oral  SpO2: 96% 94% 94% 96%  Weight: (!) 145.7 kg     Height:        CBC:  Recent Labs  Lab 05/30/19 0151 06/01/19 0314 06/02/19 0449  WBC 8.9 13.5* 10.6*  NEUTROABS 6.2 10.5*  --   HGB 16.2 14.3 14.2  HCT 49.4 43.8 44.7  MCV 91.7 92.4 94.7  PLT 240 222 224    Basic Metabolic Panel:  Recent Labs  Lab 06/01/19 0314  06/01/19 1652 06/02/19 0449  NA 137  --   --  140  K 4.5  --   --  4.4  CL 105  --   --  103  CO2 22  --   --  29  GLUCOSE 225*  --   --  176*  BUN 18  --   --  13  CREATININE 1.02  --   --  0.79  CALCIUM 8.2*  --   --  8.5*  MG  --    < > 2.5* 2.6*  PHOS  --    < > 3.1 3.0   < > = values in this interval not displayed.   Lipid Panel:     Component Value Date/Time   CHOL 196 06/01/2019 0314   TRIG 53 06/01/2019 0314   HDL 50 06/01/2019 0314   CHOLHDL 3.9 06/01/2019 0314   VLDL 11 06/01/2019 0314   LDLCALC 135 (H) 06/01/2019 0314   HgbA1c:  Lab Results  Component Value Date   HGBA1C 10.6 (H) 05/30/2019   Urine Drug Screen:     Component Value Date/Time   LABOPIA NONE DETECTED 05/30/2019 0151   COCAINSCRNUR NONE DETECTED 05/30/2019 0151   LABBENZ NONE DETECTED 05/30/2019 0151   AMPHETMU NONE DETECTED 05/30/2019 0151   THCU NONE DETECTED 05/30/2019 0151   LABBARB NONE DETECTED 05/30/2019 0151    Alcohol Level     Component Value Date/Time   ETH <10 05/30/2019 0151    IMAGING Dg Abd 1 View  Result Date: 06/01/2019 CLINICAL DATA:  Feeding tube placement EXAM:  ABDOMEN - 1 VIEW COMPARISON:  None. FINDINGS: Feeding tube tip passes through the body of the stomach to the region of the pylorus. Small amount of contrast injected appears to fill the duodenum. Normal bowel gas pattern. IMPRESSION: Feeding tube tip in the region the pylorus, possibly proximal duodenum. Electronically Signed   By: Marlan Palauharles  Clark M.D.   On: 06/01/2019 09:51   Dg Chest Port 1 View  Result Date: 06/02/2019 CLINICAL DATA:  Respiratory difficulty EXAM: PORTABLE CHEST 1 VIEW COMPARISON:  06/01/2019 FINDINGS: Cardiac shadow is again enlarged but stable. Feeding catheter has been removed. The lungs are well aerated bilaterally. Minimal right basilar atelectasis is seen. No focal confluent infiltrate is noted. No bony abnormality is seen. IMPRESSION: Minimal right basilar atelectasis. Electronically Signed   By: Alcide CleverMark  Lukens M.D.   On: 06/02/2019 07:00  Dg Chest Port 1 View  Result Date: 06/01/2019 CLINICAL DATA:  Difficulty swallowing with laryngitis EXAM: PORTABLE CHEST 1 VIEW COMPARISON:  Radiograph May 30, 2019 FINDINGS: Lung volumes are diminished with increasing hazy opacities in the lung bases favored to reflect increasing areas of atelectasis. No pneumothorax. No effusion. No acute osseous or soft tissue abnormality. Cardiomediastinal contours are unremarkable. A transesophageal tube tip is distal to the GE junction. IMPRESSION: 1. Low lung volumes with increasing bibasilar atelectasis. 2. Lungs otherwise clear. Electronically Signed   By: Kreg ShropshirePrice  DeHay M.D.   On: 06/01/2019 15:22   Dg Chest Port 1 View  Result Date: 05/31/2019 CLINICAL DATA:  Acute respiratory failure EXAM: PORTABLE CHEST 1 VIEW COMPARISON:  05/31/2019 FINDINGS: Cardiac shadow is within normal limits. Previously seen atelectasis is again noted in the bases. No new focal abnormality is seen. No bony abnormality is noted. IMPRESSION: Stable basilar atelectasis.  No new focal abnormality is seen. Electronically Signed    By: Alcide CleverMark  Lukens M.D.   On: 05/31/2019 11:51   2D Echocardiogram  1. The left ventricle has normal systolic function with an ejection fraction of 60-65%. The cavity size was normal. There is mildly increased left ventricular wall thickness. Left ventricular diastolic parameters were normal.  2. The right ventricle has normal systolic function. The cavity was normal. There is no increase in right ventricular wall thickness.  3. Moderate thickening of the mitral valve leaflet. Mild calcification of the mitral valve leaflet.  4. The aortic valve is tricuspid. Mild thickening of the aortic valve. Mild calcification of the aortic valve.  5. The aorta is normal in size and structure.  6. The interatrial septum was not well visualized.  PHYSICAL EXAM   Temp:  [98.4 F (36.9 C)-99.6 F (37.6 C)] 98.4 F (36.9 C) (08/14 0800) Pulse Rate:  [82-106] 93 (08/14 0800) Resp:  [12-27] 20 (08/14 0800) BP: (123-189)/(57-88) 143/76 (08/14 0800) SpO2:  [91 %-98 %] 96 % (08/14 0800) Weight:  [145.7 kg] 145.7 kg (08/14 0500)  General - Well nourished, well developed, in no apparent distress.  Ophthalmologic - fundi not visualized due to noncooperation.  Cardiovascular - Regular rate and rhythm.  Mental Status -  Level of arousal and orientation to time, place, and person were intact. Language including expression, naming, repetition, comprehension was assessed and found intact. However, significant hoarsness  Cranial Nerves II - XII - II - Visual field intact OU. III, IV, VI - Extraocular movements intact. However, vertical nystagmus with up or downward gaze. 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 VII - left mild facial droop. VIII - Hearing & vestibular intact bilaterally. X - Hoarseness and difficulty with swallowing XI - Chin turning & shoulder shrug intact bilaterally. XII - Tongue  protrusion intact.  Motor Strength - The patient's strength was normal in all extremities except left pronator drift.  Bulk was normal and fasciculations were absent.   Motor Tone - Muscle tone was assessed at the neck and appendages and was normal.  Reflexes - The patient's reflexes were symmetrical in all extremities and he had no pathological reflexes.  Sensory - Light touch, temperature/pinprick were assessed and were symmetrical.    Coordination - The patient had normal movements in the hands with no ataxia or dysmetria.  Tremor was absent.  Gait and Station - deferred.   ASSESSMENT/PLAN Mr. Teodoro KilRandolph Geoghegan is a 63 y.o. male with hx  DB who stopped meds 10 yrs ago and morbid obesity presenting to [facility] ED with dysarthria, left facial droop, double vision and gait unsteadiness since Sunday 8/9.   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 Mild small vessel disease.     MRI  Small lateral L medullary infarct. Small vessel disease white matter and pons.   CTA head & neck no ELVO. Diffused intracranial atherosclerosis. Mod supraclinoid B ICAs. Mild stenosis L VA origin  2D Echo EF 60-65%. No source of embolus   LDL 135  HgbA1c 10.6  Lovenox 70 mg sq daily for VTE prophylaxis  No antithrombotic prior to admission, now on aspirin 81 mg and plavix 75mg  DAPT. Continue DAPT for 3 months and then ASA alone.    Therapy recommendations:  CIR. Consult placed  Disposition:  pending   Hypoxemic Respiratory failure Hoarseness, improving  Secondary to medullary stroke  NTS prn, robinul prn  Still has secretions and tachypnea/tachycardia, but improving  Added scopolamine patch to decrease secretion  OK to transfer out of ICU today  Possible Aspiration PNA  Secondary to medullary stroke  CXR minimal right basilar atelectasis  WBC 8.9->13.5->10.6  No abx indicated at  present  CCM on board  Blood Pressure  No hx HTN  BP 140-160  Permissive hypertension (OK if < 220/120) for 48 hours and then gradually normalized in 3-5 days.  . BP goal normotensive  Hyperlipidemia  LDL 135, goal < 70  on no statin PTA  now on lipitor 40  Continue statin at d/c  Diabetes type II, new diagnosis  Home meds:  none  HgbA1c 10.6, goal < 7.0  CBGs  SSI 0-15  DB coordinator consulted  Dysphagia . Secondary to medullary stroke . Did not pass swallow . continue IVF @ 50 ->25 . On TF @ 65cc/h . Speech on board   Other Stroke Risk Factors  ETOH use, advised to drink no more than 2 drink(s) a day  Morbid Obesity, Body mass index is 43.56 kg/m., recommend weight loss, diet and exercise as appropriate   Other Active Problems  AKI Cr 1.56->1.08->0.79 - continue gentle hydration    Hospital day # 3  Neurology will sign off. Please call with questions. Pt will follow up with stroke clinic NP at [facility] in about 4 weeks. Thanks for the consult.   Rosalin Hawking, MD PhD Stroke Neurology 06/02/2019 8:47 AM   To contact Stroke Continuity provider, please refer to [email or web address] After hours, contact General Neurology

## 2019-06-02 NOTE — Progress Notes (Signed)
Inpatient Rehabilitation-Admissions Coordinator   Pt continues to require deep suctioning per RN. AC will follow up Monday to give pt a chance to improve secretion management prior to start of rehab program. Will need each therapy to see pt at least once over the weekend so I can begin insurance authorization process Monday morning for possible admit.   Please call if questions.   Jhonnie Garner, OTR/L  Rehab Admissions Coordinator  [phone] 06/02/2019 10:00 AM

## 2019-06-02 NOTE — Evaluation (Signed)
Speech Language Pathology Evaluation Patient Details Name: Michael Bright MRN: [id number] DOB: [birth year] Today's Date: 06/02/2019 Time: 6270-3500 SLP Time Calculation (min) (ACUTE ONLY): 25 min  Problem List:  Patient Active Problem List   Diagnosis Date Noted  . Acute CVA (cerebrovascular accident) ([place]) 05/30/2019  . 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 Dysphagia 05/30/2019  . Renal insufficiency 05/30/2019  . Diplopia   . Weakness    Past Medical History: History reviewed. No pertinent past medical history. Past Surgical History:  Past Surgical History:  Procedure Laterality Date  . CHOLECYSTECTOMY     HPI:  Pt is a 63 y.o. male with hx of DM2, stopped taking medications/ seeing doctor's over 10 yrs ago. Presented to ED last night w/ c/o L leg weakness and numbness, onset Sunday morning (>24 hrs from presentation).  Seen in ED and pt also noted swallowing problems, L facial numbness and double vision. CT head was negative. 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    Assessment / Plan / Recommendation Clinical Impression  Pt was seen for speech/language/cognition evaluation with his wife present. Pt reported that he is currently on disability but did not have any deficits in speech, language or cognition prior to admission. Pt and his wife denied observance of any acute deficits in language or cognition since admission but both parties reported changes in his speech. Pt's secretion management remains impaired and frequent suctioning was needed during the evaluation. Pt presents with moderate dysarthria characterized by reduced articulatory precision, a hoarse vocal quality and a reduced vocal intensity secondary to difficulty adequately coordinating respiration with speech. His  language and cognitive-linguistic skills were within normal limits during the evaluation. Skilled SLP services are clinically indicated at this time to improve dysarthria and SLP will continue to follow for dysphagia. Pt, nursing, and his wife were educated regarding results and recommendations; all parties verbalized understanding as well as agreement with plan of care.    SLP Assessment  SLP Recommendation/Assessment: Patient needs continued Speech Lanaguage Pathology Services SLP Visit Diagnosis: Dysarthria and anarthria (R47.1)    Follow Up Recommendations  Inpatient Rehab    Frequency and Duration min 2x/week  2 weeks      SLP Evaluation Cognition  Overall Cognitive Status: Within Functional Limits for tasks assessed Arousal/Alertness: Awake/alert Orientation Level: Oriented X4 Attention: Focused;Sustained Focused Attention: Appears intact Sustained Attention: Appears intact Memory: Impaired Memory Impairment: Storage deficit;Retrieval deficit(Immediate: 3/3; delayed: 2/3 with cues: 1/1) Awareness: Appears intact Problem Solving: Appears intact(3/3) Executive Function: Reasoning Reasoning: Appears intact       Comprehension  Auditory Comprehension Overall Auditory Comprehension: Appears within functional limits for tasks assessed Yes/No Questions: Within Functional Limits Basic Biographical Questions: (5/5) Complex Questions: (4/45) Paragraph Comprehension (via yes/no questions): (4/4) Commands: Within Functional Limits Multistep Basic Commands: (4/4) Complex Commands: (4/4) Conversation: Complex Visual Recognition/Discrimination Discrimination: Within Function Limits Reading Comprehension Reading Status: Not tested    Expression Expression Primary Mode of Expression: Verbal Verbal Expression Overall Verbal Expression: Appears within functional limits for tasks assessed Initiation: No impairment Automatic Speech: Counting;Day of week;Month of year(WFL) Level of  Generative/Spontaneous Verbalization: Conversation Repetition: No impairment(5/5) Naming: No impairment Responsive: (5/5) Confrontation: Within functional limits(10/10) Convergent: (5/5) Pragmatics: No impairment   Oral / Motor  Oral Motor/Sensory Function Overall Oral Motor/Sensory Function: Within functional limits Motor Speech Overall  Motor Speech: Impaired Respiration: Impaired Level of Impairment: Sentence Phonation: Hoarse;Low vocal intensity Resonance: Within functional limits Articulation: Impaired Level of Impairment: Conversation Intelligibility: Intelligibility reduced Word: 75-100% accurate Phrase: 75-100% accurate Sentence: 75-100% accurate Conversation: 75-100% accurate Motor Planning: Witnin functional limits Motor Speech Errors: Aware;Consistent   Makynzie Dobesh I. Vear ClockPhillips, MS, CCC-SLP Acute Rehabilitation Services Office number [phone] Pager [phone]                    Scheryl MartenShanika I Akisha Sturgill 06/02/2019, 3:18 PM

## 2019-06-02 NOTE — Progress Notes (Signed)
NAME:  Michael KilRandolph Anzaldo, MRN:  [id number], DOB:  03-15-1956, LOS: 3 ADMISSION DATE:  05/30/2019, CONSULTATION DATE:  05/31/19 REFERRING MD:  Antionette Charpyd  CHIEF COMPLAINT:  Secretions   Brief History   Michael Bright is a 63 y.o. 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  PCCM asked to evaluate early AM 8/12 for poor secretion management.  History of present illness   Michael Bright is a 63 y.o. male who has a PMH including but not limited to DM, HTN.  He apparently stopped taking medications and / or seeing health care providers over 10 years ago.   [facility] with LLE weakness and numnbess that started 8/9.  Also had dysphagia, L facial numbness, double vision.  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  [facility].  [facility]  Early AM 6/12, he had moment of increased RR with increase in oral secretions.  He required suctioning by RT due to inability to clear secretions.  He was also noted to have accessory muscle usage; therefore, PCCm asked to evaluate.  At the time of our evaluation, he did have secretions; however, work of breathing had significantly improved.  He was able to speak in full sentences.  He did require assistance with oral suctioning (he has been using yankaur most of the night; however, required assistance getting it farther back into posterior oropharynx).  Does have strong cough, weak gag.  Past Medical History  DM, HTN.  Significant Hospital Events   8/11 > admit. 8/12 > PCCM consult.  Consults:  Neuro. PCCM.  Procedures:  None.  Significant Diagnostic Tests:  CT head 8/11 > neg. MRI brain 8/11 > small left medulla infarct. Renal US 8/11 > neg. Echo 8/11 > EF 60-65%. CXR 8/12 > atx.  Micro Data:  SARS CoV2 8/11 > neg.  Antimicrobials:  None.   Interim history/subjective:  Remains hemodynamically stable and airway remains adequate therefore return to  progressive care to try at hospitalist service  Objective:  Blood pressure (!) 143/76, pulse 93, temperature 98.4 F (36.9 C), temperature source Oral, resp. rate 20, height 6' (1.829 m), weight (!) 145.7 kg, SpO2 96 %.        Intake/Output Summary (Last 24 hours) at 06/02/2019 0848 Last data filed at 06/02/2019 0600 Gross per 24 hour  Intake 1573.53 ml  Output 1100 ml  Net 473.53 ml   Filed Weights   05/30/19 2100 06/02/19 0500  Weight: (!) 146.1 kg (!) 145.7 kg    Examination: General: Obese male no acute distress HEENT: Continues to have gurgling respirations requiring frequent suction but airway does not appear to compromise Neuro: Dysphasia remains CV: Sounds regular PULM: Decreased in the bases GI: soft, bsx4 active obese Extremities: warm/dry, 1+ edema  Skin: no rashes or lesions    Assessment & Plan:   L medulla CVA. Neurology is following  Dysphagia with oral secretions - able to self suction; though occasionally needing assistance to get into posterior oropharynx / NTS. 72 hours intensive care unit without further deterioration of respiratory status Feeding tube was placed by IR on 06/01/2019 which he pulled out and will be replaced per team 06/02/2019   DM2. CBG (last 3)  Recent Labs    06/01/19 2350 06/02/19 0326 06/02/19 0804  GLUCAP 190* 167* 162*   Sliding scale insulin protocol Once tube feedings were started he will need daily coverage most  likely    Best Practice:  Diet: NPO.  06/01/2019 plan for core tract Pain/Anxiety/Delirium protocol (if indicated): N/A. VAP protocol (if indicated): N/A. DVT prophylaxis: SCD's / Enoxaparin. GI prophylaxis:  Glucose control: SSI. Mobility: Bedrest. Code Status: Full. Family Communication: None available. Disposition: Return to progressive care unit under [facility] service we will sign off.  Labs   CBC: Recent Labs  Lab 05/30/19 0151 06/01/19 0314 06/02/19 0449  WBC 8.9 13.5* 10.6*  NEUTROABS 6.2  10.5*  --   HGB 16.2 14.3 14.2  HCT 49.4 43.8 44.7  MCV 91.7 92.4 94.7  PLT 240 222 762   Basic Metabolic Panel: Recent Labs  Lab 05/30/19 0151 05/31/19 0807 06/01/19 0314 06/01/19 1244 06/01/19 1652 06/02/19 0449  NA 132* 136 137  --   --  140  K 5.0 4.1 4.5  --   --  4.4  CL 95* 101 105  --   --  103  CO2 21* 25 22  --   --  29  GLUCOSE 334* 206* 225*  --   --  176*  BUN 18 21 18   --   --  13  CREATININE 1.56* 1.08 1.02  --   --  0.79  CALCIUM 9.1 8.9 8.2*  --   --  8.5*  MG  --  2.4  --  2.6* 2.5* 2.6*  PHOS  --  4.2  --  3.1 3.1 3.0   GFR: Estimated Creatinine Clearance: 143.7 mL/min (by C-G formula based on SCr of 0.79 mg/dL). Recent Labs  Lab 05/30/19 0151 06/01/19 0314 06/02/19 0449  WBC 8.9 13.5* 10.6*   Liver Function Tests: Recent Labs  Lab 05/30/19 0151 05/31/19 0807  AST 29 28  ALT 30 31  ALKPHOS 77 82  BILITOT 1.2 1.0  PROT 8.5* 7.5  ALBUMIN 4.2 3.7   No results for input(s): LIPASE, AMYLASE in the last 168 hours. No results for input(s): AMMONIA in the last 168 hours. ABG    Component Value Date/Time   TCO2 21 01/20/2008 1238    Coagulation Profile: Recent Labs  Lab 05/30/19 0151  INR 1.0   Cardiac Enzymes: Recent Labs  Lab 05/30/19 1122  CKTOTAL 317   HbA1C: Hgb A1c MFr Bld  Date/Time Value Ref Range Status  05/30/2019 11:22 AM 10.6 (H) 4.8 - 5.6 % Final    Comment:    (NOTE) Pre diabetes:          5.7%-6.4% Diabetes:              >6.4% Glycemic control for   <7.0% adults with diabetes    CBG: Recent Labs  Lab 06/01/19 1617 06/01/19 1948 06/01/19 2350 06/02/19 0326 06/02/19 0804  GLUCAP 168* 190* [place] Sienna Stonehocker ACNP Maryanna Shape PCCM Pager [phone] till 1 pm If no answer page [phone] 06/02/2019, 8:48 AM

## 2019-06-02 NOTE — Significant Event (Signed)
Rapid Response Event Note   Overview: Time Called: 2249 Arrival Time: 2253 Event Type: Respiratory  Initial Focused Assessment: Called d/t concern w/ aspiration. Pt sitting up A&O but somnolent, RT suctioned TF when NTS'd, cortrak auscultated in correct location, retractions noted, lungs rhonchi throughout, congested breathing. HR 122, BP 184/99, sats 94% 3L [place], T 100.1.    Interventions:  CXR-pending  ABG-7.35/62.9/59.6/33.1  Hold TF  Plan of Care (if not transferred): Continue to NTS PRN, continue to monitor for respiratory decline, call RRT for further assistance.  Update 2345: Pt began to deteriorate  HR 140's, SpO2 86% on 6L [place], increasing restlessness. Pt NTS Deterding updated Katie NP to bedside, oreders to tx PT Event Summary: Name of Physician Notified: Deterding MD at 2307    at    Outcome: Transferred to [facility]

## 2019-06-02 NOTE — Progress Notes (Signed)
Pt arrived to 3W17. Alert and oriented, denies pain at this time. Telemetry verified. Patient on 3L [place]. Excess oral secretions noted. Oral care performed. Will continue to monitor.

## 2019-06-03 ENCOUNTER — Inpatient Hospital Stay (HOSPITAL_COMMUNITY): Payer: BC Managed Care – PPO

## 2019-06-03 DIAGNOSIS — J9601 Acute respiratory failure with hypoxia: Secondary | ICD-10-CM

## 2019-06-03 LAB — POCT I-STAT 7, (LYTES, BLD GAS, ICA,H+H)
Acid-Base Excess: 6 mmol/L — ABNORMAL HIGH (ref 0.0–2.0)
Bicarbonate: 32.3 mmol/L — ABNORMAL HIGH (ref 20.0–28.0)
Calcium, Ion: 1.19 mmol/L (ref 1.15–1.40)
HCT: 41 % (ref 39.0–52.0)
Hemoglobin: 13.9 g/dL (ref 13.0–17.0)
O2 Saturation: 100 %
Patient temperature: 102.3
Potassium: 4.8 mmol/L (ref 3.5–5.1)
Sodium: 138 mmol/L (ref 135–145)
TCO2: 34 mmol/L — ABNORMAL HIGH (ref 22–32)
pCO2 arterial: 55.2 mmHg — ABNORMAL HIGH (ref 32.0–48.0)
pH, Arterial: 7.384 (ref 7.350–7.450)
pO2, Arterial: 227 mmHg — ABNORMAL HIGH (ref 83.0–108.0)

## 2019-06-03 LAB — CBC
HCT: 41.6 % (ref 39.0–52.0)
Hemoglobin: 13.8 g/dL (ref 13.0–17.0)
MCH: 30.3 pg (ref 26.0–34.0)
MCHC: 33.2 g/dL (ref 30.0–36.0)
MCV: 91.2 fL (ref 80.0–100.0)
Platelets: 239 10*3/uL (ref 150–400)
RBC: 4.56 MIL/uL (ref 4.22–5.81)
RDW: 11.7 % (ref 11.5–15.5)
WBC: 8.1 10*3/uL (ref 4.0–10.5)
nRBC: 0 % (ref 0.0–0.2)

## 2019-06-03 LAB — GLUCOSE, CAPILLARY
Glucose-Capillary: 251 mg/dL — ABNORMAL HIGH (ref 70–99)
Glucose-Capillary: 244 mg/dL — ABNORMAL HIGH (ref 70–99)
Glucose-Capillary: 196 mg/dL — ABNORMAL HIGH (ref 70–99)
Glucose-Capillary: 187 mg/dL — ABNORMAL HIGH (ref 70–99)
Glucose-Capillary: 185 mg/dL — ABNORMAL HIGH (ref 70–99)
Glucose-Capillary: 213 mg/dL — ABNORMAL HIGH (ref 70–99)

## 2019-06-03 LAB — MAGNESIUM: Magnesium: 2.5 mg/dL — ABNORMAL HIGH (ref 1.7–2.4)

## 2019-06-03 LAB — BASIC METABOLIC PANEL
Anion gap: 10 (ref 5–15)
BUN: 18 mg/dL (ref 8–23)
CO2: 28 mmol/L (ref 22–32)
Calcium: 9 mg/dL (ref 8.9–10.3)
Chloride: 100 mmol/L (ref 98–111)
Creatinine, Ser: 0.92 mg/dL (ref 0.61–1.24)
GFR calc Af Amer: >60 mL/min (ref >60)
GFR calc non Af Amer: >60 mL/min (ref >60)
Glucose, Bld: 258 mg/dL — ABNORMAL HIGH (ref 70–99)
Potassium: 4.6 mmol/L (ref 3.5–5.1)
Sodium: 138 mmol/L (ref 135–145)

## 2019-06-03 LAB — TRIGLYCERIDES: Triglycerides: 90 mg/dL (ref <150)

## 2019-06-03 LAB — PHOSPHORUS: Phosphorus: 3 mg/dL (ref 2.5–4.6)

## 2019-06-03 LAB — PROCALCITONIN: Procalcitonin: 0.21 ng/mL

## 2019-06-03 MED ORDER — PROPOFOL 1000 MG/100ML IV EMUL
0.0000 ug/kg/min | INTRAVENOUS | Status: DC
  Administered 2019-06-03: 35.003 ug/kg/min via INTRAVENOUS
  Administered 2019-06-03: 35 ug/kg/min via INTRAVENOUS
  Administered 2019-06-03: 25 ug/kg/min via INTRAVENOUS
  Administered 2019-06-03: 40 ug/kg/min via INTRAVENOUS
  Administered 2019-06-03: 20 ug/kg/min via INTRAVENOUS
  Administered 2019-06-03: 03:00:00 40 ug/kg/min via INTRAVENOUS
  Administered 2019-06-03: 15:00:00 30 ug/kg/min via INTRAVENOUS
  Administered 2019-06-04: 25 ug/kg/min via INTRAVENOUS
  Administered 2019-06-04 (×2): 40 ug/kg/min via INTRAVENOUS
  Administered 2019-06-04: 25 ug/kg/min via INTRAVENOUS
  Administered 2019-06-04: 35 ug/kg/min via INTRAVENOUS
  Administered 2019-06-04: 30 ug/kg/min via INTRAVENOUS
  Administered 2019-06-04: 25 ug/kg/min via INTRAVENOUS
  Administered 2019-06-05: 15 ug/kg/min via INTRAVENOUS
  Administered 2019-06-05: 35 ug/kg/min via INTRAVENOUS
  Filled 2019-06-03 (×2): qty 100
  Filled 2019-06-03: qty 200
  Filled 2019-06-03 (×5): qty 100
  Filled 2019-06-03: qty 200
  Filled 2019-06-03 (×7): qty 100

## 2019-06-03 MED ORDER — DOCUSATE SODIUM 50 MG/5ML PO LIQD
100.0000 mg | Freq: Two times a day (BID) | ORAL | Status: DC | PRN
  Administered 2019-06-08 – 2019-06-27 (×8): 100 mg
  Filled 2019-06-03 (×8): qty 10

## 2019-06-03 MED ORDER — SENNOSIDES-DOCUSATE SODIUM 8.6-50 MG PO TABS
1.0000 | ORAL_TABLET | Freq: Every evening | ORAL | Status: DC | PRN
  Administered 2019-06-10 – 2019-06-27 (×7): 1
  Filled 2019-06-03 (×7): qty 1

## 2019-06-03 MED ORDER — CHLORHEXIDINE GLUCONATE 0.12% ORAL RINSE (MEDLINE KIT)
15.0000 mL | Freq: Two times a day (BID) | OROMUCOSAL | Status: DC
  Administered 2019-06-03 – 2019-06-27 (×47): 15 mL via OROMUCOSAL

## 2019-06-03 MED ORDER — ETOMIDATE 2 MG/ML IV SOLN
30.0000 mg | Freq: Once | INTRAVENOUS | Status: AC
  Administered 2019-06-03: 30 mg via INTRAVENOUS

## 2019-06-03 MED ORDER — INSULIN ASPART 100 UNIT/ML ~~LOC~~ SOLN
0.0000 [IU] | SUBCUTANEOUS | Status: DC
  Administered 2019-06-03: 3 [IU] via SUBCUTANEOUS
  Administered 2019-06-03 (×2): 5 [IU] via SUBCUTANEOUS
  Administered 2019-06-03 (×2): 3 [IU] via SUBCUTANEOUS
  Administered 2019-06-04: 2 [IU] via SUBCUTANEOUS
  Administered 2019-06-04: 5 [IU] via SUBCUTANEOUS
  Administered 2019-06-04: 3 [IU] via SUBCUTANEOUS
  Administered 2019-06-04: 5 [IU] via SUBCUTANEOUS
  Administered 2019-06-04: 2 [IU] via SUBCUTANEOUS
  Administered 2019-06-04: 20:00:00 5 [IU] via SUBCUTANEOUS
  Administered 2019-06-05: 3 [IU] via SUBCUTANEOUS
  Administered 2019-06-05: 04:00:00 5 [IU] via SUBCUTANEOUS
  Administered 2019-06-05: 3 [IU] via SUBCUTANEOUS
  Administered 2019-06-05 (×3): 5 [IU] via SUBCUTANEOUS
  Administered 2019-06-06: 8 [IU] via SUBCUTANEOUS
  Administered 2019-06-06: 3 [IU] via SUBCUTANEOUS
  Administered 2019-06-06 (×3): 5 [IU] via SUBCUTANEOUS
  Administered 2019-06-06 – 2019-06-07 (×4): 3 [IU] via SUBCUTANEOUS
  Administered 2019-06-07: 5 [IU] via SUBCUTANEOUS
  Administered 2019-06-07 (×2): 3 [IU] via SUBCUTANEOUS
  Administered 2019-06-08 (×2): 2 [IU] via SUBCUTANEOUS
  Administered 2019-06-08 (×2): 3 [IU] via SUBCUTANEOUS
  Administered 2019-06-08 (×2): 2 [IU] via SUBCUTANEOUS
  Administered 2019-06-09 (×2): 3 [IU] via SUBCUTANEOUS
  Administered 2019-06-09: 5 [IU] via SUBCUTANEOUS
  Administered 2019-06-09 (×2): 2 [IU] via SUBCUTANEOUS
  Administered 2019-06-09: 3 [IU] via SUBCUTANEOUS
  Administered 2019-06-10: 5 [IU] via SUBCUTANEOUS
  Administered 2019-06-10: 3 [IU] via SUBCUTANEOUS
  Administered 2019-06-10 (×2): 2 [IU] via SUBCUTANEOUS
  Administered 2019-06-10: 3 [IU] via SUBCUTANEOUS
  Administered 2019-06-10: 2 [IU] via SUBCUTANEOUS
  Administered 2019-06-10: 5 [IU] via SUBCUTANEOUS
  Administered 2019-06-11 – 2019-06-12 (×6): 3 [IU] via SUBCUTANEOUS
  Administered 2019-06-12 (×2): 2 [IU] via SUBCUTANEOUS
  Administered 2019-06-12: 3 [IU] via SUBCUTANEOUS
  Administered 2019-06-13 (×2): 2 [IU] via SUBCUTANEOUS
  Administered 2019-06-13: 3 [IU] via SUBCUTANEOUS
  Administered 2019-06-13 – 2019-06-14 (×3): 5 [IU] via SUBCUTANEOUS
  Administered 2019-06-14: 3 [IU] via SUBCUTANEOUS
  Administered 2019-06-14 (×2): 5 [IU] via SUBCUTANEOUS
  Administered 2019-06-14 – 2019-06-15 (×2): 3 [IU] via SUBCUTANEOUS
  Administered 2019-06-15: 01:00:00 8 [IU] via SUBCUTANEOUS
  Administered 2019-06-15: 3 [IU] via SUBCUTANEOUS
  Administered 2019-06-15 (×2): 5 [IU] via SUBCUTANEOUS
  Administered 2019-06-16 – 2019-06-18 (×13): 3 [IU] via SUBCUTANEOUS
  Administered 2019-06-18: 2 [IU] via SUBCUTANEOUS
  Administered 2019-06-18 (×2): 3 [IU] via SUBCUTANEOUS
  Administered 2019-06-19: 5 [IU] via SUBCUTANEOUS
  Administered 2019-06-19 (×2): 3 [IU] via SUBCUTANEOUS
  Administered 2019-06-19: 2 [IU] via SUBCUTANEOUS
  Administered 2019-06-20 (×2): 3 [IU] via SUBCUTANEOUS
  Administered 2019-06-20 (×2): 5 [IU] via SUBCUTANEOUS
  Administered 2019-06-20 – 2019-06-21 (×4): 3 [IU] via SUBCUTANEOUS
  Administered 2019-06-21 (×3): 5 [IU] via SUBCUTANEOUS
  Administered 2019-06-21: 3 [IU] via SUBCUTANEOUS
  Administered 2019-06-22: 5 [IU] via SUBCUTANEOUS
  Administered 2019-06-22 (×3): 3 [IU] via SUBCUTANEOUS
  Administered 2019-06-22: 5 [IU] via SUBCUTANEOUS
  Administered 2019-06-23: 3 [IU] via SUBCUTANEOUS
  Administered 2019-06-23: 5 [IU] via SUBCUTANEOUS
  Administered 2019-06-23 – 2019-06-24 (×7): 3 [IU] via SUBCUTANEOUS
  Administered 2019-06-24 (×2): 5 [IU] via SUBCUTANEOUS
  Administered 2019-06-24 – 2019-06-25 (×5): 3 [IU] via SUBCUTANEOUS
  Administered 2019-06-25: 5 [IU] via SUBCUTANEOUS
  Administered 2019-06-25 – 2019-06-26 (×2): 3 [IU] via SUBCUTANEOUS
  Administered 2019-06-26: 5 [IU] via SUBCUTANEOUS
  Administered 2019-06-26 (×2): 3 [IU] via SUBCUTANEOUS
  Administered 2019-06-26: 5 [IU] via SUBCUTANEOUS
  Administered 2019-06-26: 2 [IU] via SUBCUTANEOUS
  Administered 2019-06-27 (×4): 3 [IU] via SUBCUTANEOUS

## 2019-06-03 MED ORDER — VITAL HIGH PROTEIN PO LIQD
1000.0000 mL | ORAL | Status: DC
  Administered 2019-06-03: 1000 mL

## 2019-06-03 MED ORDER — FAMOTIDINE 40 MG/5ML PO SUSR: 20.0000 mg | Freq: Two times a day (BID) | ORAL | Status: DC

## 2019-06-03 MED ORDER — CLOPIDOGREL BISULFATE 75 MG PO TABS
75.0000 mg | ORAL_TABLET | Freq: Every day | ORAL | Status: DC
  Administered 2019-06-03: 75 mg
  Filled 2019-06-03: qty 1

## 2019-06-03 MED ORDER — ORAL CARE MOUTH RINSE
15.0000 mL | OROMUCOSAL | Status: DC
  Administered 2019-06-03 (×3): 15 mL via OROMUCOSAL

## 2019-06-03 MED ORDER — SODIUM CHLORIDE 0.9 % IV SOLN
3.0000 g | Freq: Four times a day (QID) | INTRAVENOUS | Status: DC
  Administered 2019-06-03 – 2019-06-05 (×11): 3 g via INTRAVENOUS
  Filled 2019-06-03 (×4): qty 3
  Filled 2019-06-03 (×2): qty 8
  Filled 2019-06-03 (×5): qty 3
  Filled 2019-06-03: qty 8
  Filled 2019-06-03: qty 3
  Filled 2019-06-03: qty 8
  Filled 2019-06-03: qty 3

## 2019-06-03 MED ORDER — FENTANYL CITRATE (PF) 100 MCG/2ML IJ SOLN
100.0000 ug | Freq: Once | INTRAMUSCULAR | Status: AC
  Administered 2019-06-03: 100 ug via INTRAVENOUS

## 2019-06-03 MED ORDER — CHLORHEXIDINE GLUCONATE 0.12% ORAL RINSE (MEDLINE KIT)
15.0000 mL | Freq: Two times a day (BID) | OROMUCOSAL | Status: DC
  Administered 2019-06-03: 15 mL via OROMUCOSAL

## 2019-06-03 MED ORDER — SUCCINYLCHOLINE CHLORIDE 20 MG/ML IJ SOLN: 100.0000 mg | Freq: Once | INTRAMUSCULAR | Status: DC

## 2019-06-03 MED ORDER — ORAL CARE MOUTH RINSE
15.0000 mL | OROMUCOSAL | Status: DC
  Administered 2019-06-03 – 2019-06-27 (×223): 15 mL via OROMUCOSAL

## 2019-06-03 MED ORDER — PRO-STAT SUGAR FREE PO LIQD
60.0000 mL | Freq: Four times a day (QID) | ORAL | Status: DC
  Administered 2019-06-03 – 2019-06-05 (×7): 60 mL
  Filled 2019-06-03 (×7): qty 60

## 2019-06-03 MED ORDER — INSULIN GLARGINE 100 UNIT/ML ~~LOC~~ SOLN
20.0000 [IU] | Freq: Every day | SUBCUTANEOUS | Status: DC
  Administered 2019-06-03 – 2019-06-04 (×2): 20 [IU] via SUBCUTANEOUS
  Filled 2019-06-03 (×3): qty 0.2

## 2019-06-03 MED ORDER — FENTANYL CITRATE (PF) 100 MCG/2ML IJ SOLN
50.0000 ug | INTRAMUSCULAR | Status: DC | PRN
  Administered 2019-06-03 – 2019-06-04 (×3): 100 ug via INTRAVENOUS
  Filled 2019-06-03: qty 4
  Filled 2019-06-03 (×4): qty 2

## 2019-06-03 MED ORDER — ASPIRIN 81 MG PO CHEW
81.0000 mg | CHEWABLE_TABLET | Freq: Every day | ORAL | Status: DC
  Administered 2019-06-03 – 2019-06-27 (×25): 81 mg
  Filled 2019-06-03 (×24): qty 1

## 2019-06-03 MED ORDER — GLUCERNA 1.2 CAL PO LIQD
1000.0000 mL | ORAL | Status: DC
  Filled 2019-06-03: qty 1000

## 2019-06-03 MED ORDER — CHLORHEXIDINE GLUCONATE CLOTH 2 % EX PADS
6.0000 | MEDICATED_PAD | Freq: Every day | CUTANEOUS | Status: DC
  Administered 2019-06-03 – 2019-06-05 (×3): 6 via TOPICAL

## 2019-06-03 MED ORDER — FENTANYL CITRATE (PF) 100 MCG/2ML IJ SOLN
50.0000 ug | INTRAMUSCULAR | Status: DC | PRN
  Administered 2019-06-04 – 2019-06-11 (×2): 50 ug via INTRAVENOUS
  Filled 2019-06-03 (×4): qty 2

## 2019-06-03 MED ORDER — FAMOTIDINE 40 MG/5ML PO SUSR
20.0000 mg | Freq: Two times a day (BID) | ORAL | Status: DC
  Administered 2019-06-03 – 2019-06-27 (×49): 20 mg
  Filled 2019-06-03 (×50): qty 2.5

## 2019-06-03 NOTE — Procedures (Signed)
Intubation Procedure Note Michael Bright [id number] August 20, 1956  Procedure: Intubation Indications: Respiratory insufficiency  Procedure Details Consent: Risks of procedure as well as the alternatives and risks of each were explained to the (patient/caregiver).  Consent for procedure obtained. Time Out: Verified patient identification, verified procedure, site/side was marked, verified correct patient position, special equipment/implants available, medications/allergies/relevent history reviewed, required imaging and test results available.  Performed  Maximum sterile technique was used including gloves.  3    Evaluation Hemodynamic Status: BP stable throughout; O2 sats: transiently fell during during procedure Patient's Current Condition: stable Complications: No apparent complications Patient did tolerate procedure well. Chest X-ray ordered to verify placement.  CXR: pending.   Michael Bright 06/03/2019

## 2019-06-03 NOTE — Progress Notes (Addendum)
  63yo male admitted 8/11 with L medulla CVA.  He was seen ninitially by [facility] 8/12 with concerns for aspiration and poor secretion management and was monitored in ICU 48hrs and remained stable.  He was tx to floor 8/14.   Called to bedside for worsening dyspnea, tachycardia, congestion, hypoxia despite 6L [place], ?aspiration.  Bedside RN attempted NTSuction with TF appearing secretions returned. CXR pending.  Sats now slightly improved 94% on 6L [place].     General:  Uncomfortable appearing male, mild resp distress  HEENT: MM pink/moist Neuro: drowsy but easily arousable, answers questions, oriented to place and time CV: s1s2 tachy, no m/r/g PULM:  resps slightly labored on [place], coarse throughout, very wet hoarse voice GI: soft, bsx4 active  Extremities: warm/dry, no edema  Skin: no rashes or lesions  Imp/plan -   Acute hypoxic respiratory failure - likely r/t aspiration in setting stroke  PLAN  tx ICU now Tenuous resp status - poor bipap candidate - suspect will need intubation  NT suction PRN  F/u CXR  Empiric unasyn Hold TF  NPO  Supplemental O2 as needed to keep sats >95%  L medulla CVA  PLAN -  Per stroke  Plan for CIR once more stable   DM  PLAN -  SSI   Updated wife at length via phone. Confirmed full code status and she is ok with intubation if needed.   Nickolas Madrid, NP 06/03/2019  12:17 AM Pager: [phone] or [phone]

## 2019-06-03 NOTE — Progress Notes (Signed)
NAME:  Michael Bright, MRN:  [id number], DOB:  1956/01/13, LOS: 4 ADMISSION DATE:  05/30/2019, CONSULTATION DATE:  05/31/19 REFERRING MD:  Antionette Charpyd  CHIEF COMPLAINT:  Secretions   Brief History   Michael Bright is a 63 y.o. 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  PCCM asked to evaluate early AM 8/12 for poor secretion management.  History of present illness   Michael Bright is a 63 y.o. male who has a PMH including but not limited to DM, HTN.  He apparently stopped taking medications and / or seeing health care providers over 10 years ago.   [facility] with LLE weakness and numnbess that started 8/9.  Also had dysphagia, L facial numbness, double vision.  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  [facility].  [facility]  Early AM 6/12, he had moment of increased RR with increase in oral secretions.  He required suctioning by RT due to inability to clear secretions.  He was also noted to have accessory muscle usage; therefore, PCCM asked to evaluate. At the time the patient appeared to be protecting his airway, but gag was weak,  The patient was transferred to ICU atter our evaluation for close monitoring.The patient was monitored in the ICU x 48 hours and remained stable without airway issues. He was transferred out to floor 8/14. PCCM was again called to the floor the early am of 8/15 with worsening dyspnea, tachycardia, congestion, hypoxia despite 6L [place], ? aspiration.  Bedside RN attempted NT Suction with TF appearing secretions returned. Sats were 94% on 6 L .  Pt. Was transferred to ICU for acute hypoxic respiratory failure most likely 2/2 aspiration in setting of stroke. Pt. Is a poor BiPAP candidate. CXR was obtained which showed atelectasis. Empiric Unasyn was started. Pt was made NPO. Nelva BushKatie Whitehart NP spoke with wife by phone who confirmed full code status and  ok with intubation if needed.    Pt continued to deteriorate and was intubated 06/03/2019 at about 1 am. He has subsequently developed  fever to 102.3 PCCM will manage care while intubated and in the ICU    Past Medical History  DM, HTN.  Significant Hospital Events   8/11 > admit. 8/12 > PCCM consult. 8/15 PCCM re consulted  8/15 ETT>>>  Consults:  Neuro. PCCM.  Procedures:  None.  Significant Diagnostic Tests:  CT head 8/11 > neg. MRI brain 8/11 > small left medulla infarct. Renal US 8/11 > neg. Echo 8/11 > EF 60-65%. CXR 8/12 > atx. CXR 8/15>> Streaky opacities favoring atelectasis.  Micro Data:  SARS CoV2 8/11 > neg. MRSA  05/31/2019>> Neg 8/15 Blood Cx>> 8/15 Sputum Cx 8/15 Urine legionella 8/15 Urine Strep  Antimicrobials:  06/03/2019 Unasyn>>   Interim history/subjective:  Intubated overnight 8/15  for suspected aspiration and respiratory compromise. Stable on 50%, 5 PEEP, Rate of 18 T max is 102.3, WBC 8.1 Unasyn initiated 8/15  Objective:  Blood pressure 135/86, pulse 94, temperature 97.9 F (36.6 C), temperature source Axillary, resp. rate 18, height 6' (1.829 m), weight (!) 142.6 kg, SpO2 97 %.    Vent Mode: PRVC FiO2 (%):  [40 %-100 %] 40 % Set Rate:  [18 bmp] 18 bmp Vt Set:  [620 mL] 620 mL PEEP:  [5 cmH20] 5 cmH20 Plateau Pressure:  [20 cmH20-23 cmH20] 21 cmH20   Intake/Output Summary (Last 24 hours) at  06/03/2019 1136 Last data filed at 06/03/2019 0600 Gross per 24 hour  Intake 1080.93 ml  Output 700 ml  Net 380.93 ml   Filed Weights   06/02/19 0500 06/03/19 0037 06/03/19 0416  Weight: (!) 145.7 kg (!) 142.6 kg (!) 142.6 kg    Examination: General: Obese male , sedated and  orally intubated with Cor Track  Secure and in place HEENT: Oral ETT is secure and intact , Cor Track, Thick neck, No LAD Neuro: Sedated and intubated, MAE x 4, Follows no commands due to sedation CV:S1, S2, RRR, No RMG  PULM: Bilateral chest excursion GI: soft, bsx4 active obese Extremities:  warm/dry, 1+ edema  Skin: no rashes or lesions    Assessment & Plan:   Acute Respiratory Failure suspected 2/2 Aspiration 8/15>> TF suctioned with NTS with acute decompensation New fever 102.3 Plan Continue Full vent support today with SBT in am 8/16 ABG 8/16 am Wean FiO2 and PEEP for sats of > 94% VAP protocol CXR in am and prn ABG 8/16 am RASS Goal of -1 Minimize sedation as able ( Propofol) Trend Triglycerides  Fever 102.3 with suspected aspiration ( TF suctioned from airway) WBC 8.1 Unasyn started 8/15 CXR 8/15>> atelectasis Plan Sputum Cx Blood Cx x 2 Urine for legionella and Strep Follow Micro CXR 8/16 am and prn PCT now and in am 8/16   Renal Monitor UO Trend BMET Replete lytes as needed Mag and Phos 8/16 am  Nutrition NPO for now  Cor track in place May need to consider post pyloric placement   DM CBG Q 4 SSI  L medulla CVA. Neurology is following Plan for CIR once medically stable   Best Practice:  Diet: NPO. core tract Pain/Anxiety/Delirium protocol (if indicated): N/A. VAP protocol (if indicated): in place DVT prophylaxis: SCD's / Enoxaparin. GI prophylaxis: Pepcid Glucose control: SSI. Mobility: Bedrest. Code Status: Full. Family Communication: Updated wife at bedside 8/15 Disposition: ICU  Labs   CBC: Recent Labs  Lab 05/30/19 0151 06/01/19 0314 06/02/19 0449 06/03/19 0154 06/03/19 0218  WBC 8.9 13.5* 10.6*  --  8.1  NEUTROABS 6.2 10.5*  --   --   --   HGB 16.2 14.3 14.2 13.9 13.8  HCT 49.4 43.8 44.7 41.0 41.6  MCV 91.7 92.4 94.7  --  91.2  PLT 240 222 224  --  416   Basic Metabolic Panel: Recent Labs  Lab 05/30/19 0151  05/31/19 0807 06/01/19 0314 06/01/19 1244 06/01/19 1652 06/02/19 0449 06/02/19 1627 06/03/19 0154 06/03/19 0218  NA 132*  --  136 137  --   --  140  --  138 138  K 5.0  --  4.1 4.5  --   --  4.4  --  4.8 4.6  CL 95*  --  101 105  --   --  103  --   --  100  CO2 21*  --  25 22  --   --  29   --   --  28  GLUCOSE 334*  --  206* 225*  --   --  176*  --   --  258*  BUN 18  --  21 18  --   --  13  --   --  18  CREATININE 1.56*  --  1.08 1.02  --   --  0.79  --   --  0.92  CALCIUM 9.1  --  8.9 8.2*  --   --  8.5*  --   --  9.0  MG  --    < > 2.4  --  2.6* 2.5* 2.6* 2.5*  --  2.5*  PHOS  --    < > 4.2  --  3.1 3.1 3.0 2.9  --  3.0   < > = values in this interval not displayed.   GFR: Estimated Creatinine Clearance: 123.6 mL/min (by C-G formula based on SCr of 0.92 mg/dL). Recent Labs  Lab 05/30/19 0151 06/01/19 0314 06/02/19 0449 06/03/19 0218  WBC 8.9 13.5* 10.6* 8.1   Liver Function Tests: Recent Labs  Lab 05/30/19 0151 05/31/19 0807  AST 29 28  ALT 30 31  ALKPHOS 77 82  BILITOT 1.2 1.0  PROT 8.5* 7.5  ALBUMIN 4.2 3.7   No results for input(s): LIPASE, AMYLASE in the last 168 hours. No results for input(s): AMMONIA in the last 168 hours. ABG    Component Value Date/Time   PHART 7.384 06/03/2019 0154   PCO2ART 55.2 (H) 06/03/2019 0154   PO2ART 227.0 (H) 06/03/2019 0154   HCO3 32.3 (H) 06/03/2019 0154   TCO2 34 (H) 06/03/2019 0154   O2SAT 100.0 06/03/2019 0154    Coagulation Profile: Recent Labs  Lab 05/30/19 0151  INR 1.0   Cardiac Enzymes: Recent Labs  Lab 05/30/19 1122  CKTOTAL 317   HbA1C: Hgb A1c MFr Bld  Date/Time Value Ref Range Status  05/30/2019 11:22 AM 10.6 (H) 4.8 - 5.6 % Final    Comment:    (NOTE) Pre diabetes:          5.7%-6.4% Diabetes:              >6.4% Glycemic control for   <7.0% adults with diabetes    CBG: Recent Labs  Lab 06/02/19 1536 06/02/19 2024 06/03/19 0146 06/03/19 0321 06/03/19 0739  GLUCAP 202* 223* 251* 244* 196*    CC App Time 40 minutes   Bevelyn NgoSarah F. Groce, AGACNP-BC [facility] Pager # [phone] After 4 pm please call  [phone] 06/03/2019, 11:36 AM

## 2019-06-03 NOTE — Plan of Care (Signed)
  Problem: Ischemic Stroke/TIA Tissue Perfusion: Goal: Complications of ischemic stroke/TIA will be minimized Outcome: Not Progressing   

## 2019-06-03 NOTE — Progress Notes (Signed)
PT Cancellation Note  Patient Details Name: Michael Bright MRN: [id number] DOB: [birth year]   Cancelled Treatment:    Reason Eval/Treat Not Completed: Medical issues which prohibited therapy (intubated overnight).  Ellamae Sia, PT, DPT Acute Rehabilitation Services Pager [phone] Office [phone]    Willy Eddy 06/03/2019, 7:56 AM

## 2019-06-03 NOTE — Progress Notes (Signed)
SLP Cancellation Note  Patient Details Name: Michael Bright MRN: [id number] DOB: Jan 20, 1956   Cancelled treatment:       Reason Eval/Treat Not Completed: Medical issues which prohibited therapy  Per nursing patient was intubated due to issues managing his secretions.  There is tentative plan for a possible trach placement. ST will follow up for [place] to determine if further ST is currently warranted.    Shelly Flatten, MA, CCC-SLP Acute Rehab SLP [phone]  Lamar Sprinkles 06/03/2019, 10:32 AM

## 2019-06-03 NOTE — Progress Notes (Signed)
ET tube advanced to 25 cm at the lip per MD order. Will continue to monitor patient

## 2019-06-03 NOTE — Progress Notes (Signed)
Pt has had congestion at the beginning of the shift and according to report, he has had the congestion through out his entire stay. Pt was noted to be getting worse on his breathing and his VS. Rapid Response  and the critical care MD were notified. The MD ordered CXR, ABG and to hold tube feeding. Later, pt was unable to find his words, and severe dyspnea. Pt has been NTS'd few times with minimal improvement. Family has been notified about the pt's condition and the transfer to [place]. Will call his wife in am to communicate to her the pt's room number ([id number]).

## 2019-06-03 NOTE — Plan of Care (Signed)
  Problem: Respiratory: Goal: Ability to maintain a clear airway and adequate ventilation will improve Outcome: Not Progressing   

## 2019-06-03 NOTE — Progress Notes (Signed)
Nutrition Follow-up  DOCUMENTATION CODES:   Morbid obesity  INTERVENTION:   Tube Feeding:  -Vital High Protein at 20 ml/hr via Cortrak -60 ml Prostat QID  Provides: 1280 kcals (2088 kcal with propofol), 162 grams protein, 401 ml free water. Kcal exceed requirement to meet protein needs.   NUTRITION DIAGNOSIS:   Inadequate oral intake related to dysphagia as evidenced by NPO status.  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 PMH includes DM, HTN   8/12 Transferred to [place] for respiratory decompensation, unable to clear secretions 8/13 Cortrak inserted by [facility], TF initiated 8/14 Cortrak replaced (gastric)  RD working remotely.  Spoke with RN via phone. Pt re-intubated overnight for suspected aspiration. TF held. Recommend re-starting trickle once medically appropriate. RD to change formula to better meet pt's needs with current propofol rate.   Admission weight: 146.1 kg Current weight: 142.6 kg   Patient is currently intubated on ventilator support MV: 11.1 L/min Temp (24hrs), Avg:99.6 F (37.6 C), Min:97.9 F (36.6 C), Max:102.3 F (39.1 C)  Propofol: 30.6 ml/hr- provides 808 kcal daily   I/O: +1,600 ml since admit UOP: 1,200 ml x 24 hrs   Drips: propofol Medications: SS novolog, lantus Labs: CBG 196-251 Mg 2.5 (H)    Diet Order:   Diet Order            Diet NPO time specified  Diet effective now              EDUCATION NEEDS:   Not appropriate for education at this time  Skin:  Skin Assessment: Skin Integrity Issues: Skin Integrity Issues:: Other (Comment) Other: MASD- sacrum  Last BM:  8/12  Height:   Ht Readings from Last 1 Encounters:   06/03/19 6' (1.829 m)    Weight:   Wt Readings from Last 1 Encounters:  06/03/19 (!) 142.6 kg    Ideal Body Weight:  80.9 kg  BMI:  Body mass index is 42.64 kg/m.  Estimated Nutritional Needs:   Kcal:  2637-8588 kcal  Protein:  162-182 grams  Fluid:  >/= 1.9 L/day   Mariana Single RD, LDN Clinical Nutrition Pager # - [phone]

## 2019-06-04 ENCOUNTER — Inpatient Hospital Stay (HOSPITAL_COMMUNITY): Payer: BC Managed Care – PPO

## 2019-06-04 LAB — CBC
HCT: 42.7 % (ref 39.0–52.0)
Hemoglobin: 14.5 g/dL (ref 13.0–17.0)
MCH: 30.3 pg (ref 26.0–34.0)
MCHC: 34 g/dL (ref 30.0–36.0)
MCV: 89.3 fL (ref 80.0–100.0)
Platelets: 232 10*3/uL (ref 150–400)
RBC: 4.78 MIL/uL (ref 4.22–5.81)
RDW: 11.9 % (ref 11.5–15.5)
WBC: 12.4 10*3/uL — ABNORMAL HIGH (ref 4.0–10.5)
nRBC: 0 % (ref 0.0–0.2)

## 2019-06-04 LAB — GLUCOSE, CAPILLARY
Glucose-Capillary: 142 mg/dL — ABNORMAL HIGH (ref 70–99)
Glucose-Capillary: 144 mg/dL — ABNORMAL HIGH (ref 70–99)
Glucose-Capillary: 169 mg/dL — ABNORMAL HIGH (ref 70–99)
Glucose-Capillary: 201 mg/dL — ABNORMAL HIGH (ref 70–99)
Glucose-Capillary: 203 mg/dL — ABNORMAL HIGH (ref 70–99)
Glucose-Capillary: 205 mg/dL — ABNORMAL HIGH (ref 70–99)
Glucose-Capillary: 237 mg/dL — ABNORMAL HIGH (ref 70–99)

## 2019-06-04 LAB — PROCALCITONIN: Procalcitonin: 0.24 ng/mL

## 2019-06-04 LAB — PHOSPHORUS: Phosphorus: 1.9 mg/dL — ABNORMAL LOW (ref 2.5–4.6)

## 2019-06-04 LAB — COMPREHENSIVE METABOLIC PANEL
ALT: 31 U/L (ref 0–44)
AST: 27 U/L (ref 15–41)
Albumin: 2.7 g/dL — ABNORMAL LOW (ref 3.5–5.0)
Alkaline Phosphatase: 71 U/L (ref 38–126)
Anion gap: 11 (ref 5–15)
BUN: 22 mg/dL (ref 8–23)
CO2: 24 mmol/L (ref 22–32)
Calcium: 8.6 mg/dL — ABNORMAL LOW (ref 8.9–10.3)
Chloride: 102 mmol/L (ref 98–111)
Creatinine, Ser: 1 mg/dL (ref 0.61–1.24)
GFR calc Af Amer: >60 mL/min (ref >60)
GFR calc non Af Amer: >60 mL/min (ref >60)
Glucose, Bld: 171 mg/dL — ABNORMAL HIGH (ref 70–99)
Potassium: 3.3 mmol/L — ABNORMAL LOW (ref 3.5–5.1)
Sodium: 137 mmol/L (ref 135–145)
Total Bilirubin: 0.5 mg/dL (ref 0.3–1.2)
Total Protein: 6.7 g/dL (ref 6.5–8.1)

## 2019-06-04 LAB — MAGNESIUM: Magnesium: 2.3 mg/dL (ref 1.7–2.4)

## 2019-06-04 LAB — TRIGLYCERIDES: Triglycerides: 165 mg/dL — ABNORMAL HIGH (ref <150)

## 2019-06-04 LAB — STREP PNEUMONIAE URINARY ANTIGEN: Strep Pneumo Urinary Antigen: NEGATIVE

## 2019-06-04 MED ORDER — POTASSIUM CHLORIDE 20 MEQ/15ML (10%) PO SOLN
40.0000 meq | Freq: Two times a day (BID) | ORAL | Status: DC
  Administered 2019-06-04: 40 meq via ORAL
  Filled 2019-06-04 (×2): qty 30

## 2019-06-04 MED ORDER — POTASSIUM CHLORIDE 20 MEQ/15ML (10%) PO SOLN
40.0000 meq | Freq: Two times a day (BID) | ORAL | Status: AC
  Administered 2019-06-04: 22:00:00 40 meq

## 2019-06-04 MED ORDER — TAMSULOSIN HCL 0.4 MG PO CAPS
0.4000 mg | ORAL_CAPSULE | Freq: Every day | ORAL | Status: DC
  Administered 2019-06-04 – 2019-06-05 (×2): 0.4 mg via ORAL
  Filled 2019-06-04 (×2): qty 1

## 2019-06-04 MED ORDER — SODIUM PHOSPHATES 45 MMOLE/15ML IV SOLN
20.0000 mmol | Freq: Once | INTRAVENOUS | Status: AC
  Administered 2019-06-04: 20 mmol via INTRAVENOUS
  Filled 2019-06-04: qty 6.67

## 2019-06-04 NOTE — Progress Notes (Signed)
Verbal order by Dr. Valeta Harms to discontinue Plavix 75mg  Q daily for  tracheostomy Friday 8/21.   Last Plavix dose given 8/15.

## 2019-06-04 NOTE — Progress Notes (Signed)
eLink Physician-Brief Progress Note Patient Name: Michael Bright DOB: 01-Nov-1955 MRN: [id number]   Date of Service  06/04/2019  HPI/Events of Note  Urinary retention in this stroke patient who is intubated, on sedation, and had a straight cath done earlier as well  eICU Interventions  Place a foley catheter. Order submitted.      Intervention Category Intermediate Interventions: Oliguria - evaluation and management  Patrece Tallie G Jenniferann Stuckert 06/04/2019, 1:27 AM

## 2019-06-04 NOTE — Progress Notes (Signed)
NAME:  Michael Bright, MRN:  [id number], DOB:  [birth year], LOS: 5 ADMISSION DATE:  05/30/2019, CONSULTATION DATE:  05/31/19 REFERRING MD:  Myna Hidalgo  CHIEF COMPLAINT:  Secretions   Brief History   Michael Bright is a 63 y.o. 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  PCCM asked to evaluate early AM 8/12 for poor secretion management.  History of present illness   Michael Bright is a 63 y.o. male who has a PMH including but not limited to DM, HTN.  He apparently stopped taking medications and / or seeing health care providers over 10 years ago.   He presented to [facility] 8/11 with LLE weakness and numnbess that started 8/9.  Also had dysphagia, L facial numbness, double vision.  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  He was transferred to [facility] for further neurologic evaluation.  He was admitted by [facility].  Early AM 6/12, he had moment of increased RR with increase in oral secretions.  He required suctioning by RT due to inability to clear secretions.  He was also noted to have accessory muscle usage; therefore, PCCM asked to evaluate. At the time the patient appeared to be protecting his airway, but gag was weak,  The patient was transferred to ICU atter our evaluation for close monitoring.The patient was monitored in the ICU x 48 hours and remained stable without airway issues. He was transferred out to floor 8/14. PCCM was again called to the floor the early am of 8/15 with worsening dyspnea, tachycardia, congestion, hypoxia despite 6L [place], ? aspiration.  Bedside RN attempted NT Suction with TF appearing secretions returned. Sats were 94% on 6 L [place].  Pt. Was transferred to ICU for acute hypoxic respiratory failure most likely 2/2 aspiration in setting of stroke. Pt. Is a poor BiPAP candidate. CXR was obtained which showed atelectasis. Empiric Unasyn was started. Pt was made NPO. Ronnie Doss NP spoke with wife by phone who confirmed full code status and  ok with intubation if needed.    Pt continued to deteriorate and was intubated 06/03/2019 at about 1 am. He has subsequently developed  fever to 102.3 PCCM will manage care while intubated and in the ICU  Past Medical History  DM, HTN.  Significant Hospital Events   8/11 > admit. 8/12 > PCCM consult. 8/15 PCCM re consulted  8/15 ETT>>>  Consults:  Neuro. PCCM.  Procedures:  None.  Significant Diagnostic Tests:  CT head 8/11 > neg. MRI brain 8/11 > small left medulla infarct. Renal US 8/11 > neg. Echo 8/11 > EF 60-65%. CXR 8/12 > atx. CXR 8/15>> Streaky opacities favoring atelectasis.  Micro Data:  SARS CoV2 8/11 > neg. MRSA  05/31/2019>> Neg 8/15 Blood Cx>> 8/15 Sputum Cx 8/15 Urine legionella 8/15 Urine Strep  Antimicrobials:  06/03/2019 Unasyn>>   Interim history/subjective:  Remains intubated  Objective:  Blood pressure 127/74, pulse 95, temperature 99.1 F (37.3 C), temperature source Axillary, resp. rate 18, height 6' (1.829 m), weight (!) 138.2 kg, SpO2 95 %.    Vent Mode: PRVC FiO2 (%):  [40 %] 40 % Set Rate:  [18 bmp] 18 bmp Vt Set:  [620 mL] 620 mL PEEP:  [5 cmH20] 5 cmH20 Plateau Pressure:  [18 cmH20-21 cmH20] 20 cmH20   Intake/Output Summary (Last 24 hours) at 06/04/2019 1113 Last data filed at 06/04/2019 0800 Gross per 24 hour  Intake 1730.83 ml  Output 1745 ml  Net -14.17 ml   [employer]  06/03/19 0037 06/03/19 0416 06/04/19 0500  Weight: (!) 142.6 kg (!) 142.6 kg (!) 138.2 kg    Examination: General: Obese male , sedated and  orally intubated with Cor Track  Secure and in place HEENT: Oral ETT is secure and intact , Cor Track, Thick neck, No LAD Neuro: Sedated and intubated, MAE x 4, Follows no commands due to sedation CV:S1, S2, RRR, No RMG  PULM: Bilateral chest excursion GI: soft, bsx4 active obese Extremities: warm/dry, 1+ edema  Skin: no rashes or lesions    Assessment & Plan:   Acute Respiratory Failure suspected 2/2 Aspiration 8/15>> TF suctioned  with NTS with acute decompensation New fever 102.3 Plan Continue Full vent support today with SBT in am 8/16 ABG 8/16 am Wean FiO2 and PEEP for sats of > 94% VAP protocol CXR in am and prn ABG 8/16 am RASS Goal of -1 Minimize sedation as able ( Propofol) Trend Triglycerides  Fever 102.3 with suspected aspiration ( TF suctioned from airway) WBC 8.1 Unasyn started 8/15 CXR 8/15>> atelectasis Treat with Unasyn for 5days.   Renal Monitor UO Trend BMET Replete lytes as needed Mag and Phos 8/16 am  Nutrition NPO for now  Cor track in place May need to consider post pyloric placement   DM CBG Q 4 SSI  L medulla CVA. Neurology is following Plan for CIR once medically stable Needs tracheostomy to protect airway and allow for rehab. Will discuss Plavix washout prior to tracheostomy vs. Surgical tracheostomy.   Best Practice:  Diet: NPO. core tract Pain/Anxiety/Delirium protocol (if indicated): N/A. VAP protocol (if indicated): in place DVT prophylaxis: SCD's / Enoxaparin. GI prophylaxis: Pepcid Glucose control: SSI. Mobility: Bedrest. Code Status: Full. Family Communication: Updated wife at bedside 8/15 Disposition: ICU  Labs   CBC: Recent Labs  Lab 05/30/19 0151 06/01/19 0314 06/02/19 0449 06/03/19 0154 06/03/19 0218 06/04/19 0655  WBC 8.9 13.5* 10.6*  --  8.1 12.4*  NEUTROABS 6.2 10.5*  --   --   --   --   HGB 16.2 14.3 14.2 13.9 13.8 14.5  HCT 49.4 43.8 44.7 41.0 41.6 42.7  MCV 91.7 92.4 94.7  --  91.2 89.3  PLT 240 222 224  --  239 232   Basic Metabolic Panel: Recent Labs  Lab 05/31/19 0807 06/01/19 0314  06/01/19 1652 06/02/19 0449 06/02/19 1627 06/03/19 0154 06/03/19 0218 06/04/19 0655  NA 136 137  --   --  140  --  138 138 137  K 4.1 4.5  --   --  4.4  --  4.8 4.6 3.3*  CL 101 105  --   --  103  --   --  100 102  CO2 25 22  --   --  29  --   --  28 24  GLUCOSE 206* 225*  --   --  176*  --   --  258* 171*  BUN 21 18  --   --  13  --    --  18 22  CREATININE 1.08 1.02  --   --  0.79  --   --  0.92 1.00  CALCIUM 8.9 8.2*  --   --  8.5*  --   --  9.0 8.6*  MG 2.4  --    < > 2.5* 2.6* 2.5*  --  2.5* 2.3  PHOS 4.2  --    < > 3.1 3.0 2.9  --  3.0 1.9*   < > =  values in this interval not displayed.   GFR: Estimated Creatinine Clearance: 111.7 mL/min (by C-G formula based on SCr of 1 mg/dL). Recent Labs  Lab 06/01/19 0314 06/02/19 0449 06/03/19 0218 06/03/19 1301 06/04/19 0655  PROCALCITON  --   --   --  0.21 0.24  WBC 13.5* 10.6* 8.1  --  12.4*   Liver Function Tests: Recent Labs  Lab 05/30/19 0151 05/31/19 0807 06/04/19 0655  AST 29 28 27   ALT 30 31 31   ALKPHOS 77 82 71  BILITOT 1.2 1.0 0.5  PROT 8.5* 7.5 6.7  ALBUMIN 4.2 3.7 2.7*   No results for input(s): LIPASE, AMYLASE in the last 168 hours. No results for input(s): AMMONIA in the last 168 hours. ABG    Component Value Date/Time   PHART 7.384 06/03/2019 0154   PCO2ART 55.2 (H) 06/03/2019 0154   PO2ART 227.0 (H) 06/03/2019 0154   HCO3 32.3 (H) 06/03/2019 0154   TCO2 34 (H) 06/03/2019 0154   O2SAT 100.0 06/03/2019 0154    Coagulation Profile: Recent Labs  Lab 05/30/19 0151  INR 1.0   Cardiac Enzymes: Recent Labs  Lab 05/30/19 1122  CKTOTAL 317   HbA1C: Hgb A1c MFr Bld  Date/Time Value Ref Range Status  05/30/2019 11:22 AM 10.6 (H) 4.8 - 5.6 % Final    Comment:    (NOTE) Pre diabetes:          5.7%-6.4% Diabetes:              >6.4% Glycemic control for   <7.0% adults with diabetes    CBG: Recent Labs  Lab 06/03/19 1540 06/03/19 2035 06/04/19 0002 06/04/19 0312 06/04/19 0850  GLUCAP 185* 213* 205* 144* 169*   CRITICAL CARE Performed by: Lynnell Catalanavi Romain Erion   Total critical care time: 40 minutes  Critical care time was exclusive of separately billable procedures and treating other patients.  Critical care was necessary to treat or prevent imminent or life-threatening deterioration.  Critical care was time spent personally  by me on the following activities: development of treatment plan with patient and/or surrogate as well as nursing, discussions with consultants, evaluation of patient's response to treatment, examination of patient, obtaining history from patient or surrogate, ordering and performing treatments and interventions, ordering and review of laboratory studies, ordering and review of radiographic studies, pulse oximetry, re-evaluation of patient's condition and participation in multidisciplinary rounds.  Lynnell Catalanavi Caysen Whang, MD [facility] ICU Physician [facility]  Pager: [phone] Mobile: [phone] After hours: [phone].   06/04/2019, 11:13 AM

## 2019-06-05 LAB — CULTURE, RESPIRATORY W GRAM STAIN

## 2019-06-05 LAB — COMPREHENSIVE METABOLIC PANEL
ALT: 27 U/L (ref 0–44)
AST: 24 U/L (ref 15–41)
Albumin: 2.5 g/dL — ABNORMAL LOW (ref 3.5–5.0)
Alkaline Phosphatase: 71 U/L (ref 38–126)
Anion gap: 10 (ref 5–15)
BUN: 19 mg/dL (ref 8–23)
CO2: 22 mmol/L (ref 22–32)
Calcium: 8.1 mg/dL — ABNORMAL LOW (ref 8.9–10.3)
Chloride: 106 mmol/L (ref 98–111)
Creatinine, Ser: 0.94 mg/dL (ref 0.61–1.24)
GFR calc Af Amer: >60 mL/min (ref >60)
GFR calc non Af Amer: >60 mL/min (ref >60)
Glucose, Bld: 210 mg/dL — ABNORMAL HIGH (ref 70–99)
Potassium: 3.7 mmol/L (ref 3.5–5.1)
Sodium: 138 mmol/L (ref 135–145)
Total Bilirubin: 0.3 mg/dL (ref 0.3–1.2)
Total Protein: 6.5 g/dL (ref 6.5–8.1)

## 2019-06-05 LAB — CBC
HCT: 41.5 % (ref 39.0–52.0)
Hemoglobin: 13.7 g/dL (ref 13.0–17.0)
MCH: 29.8 pg (ref 26.0–34.0)
MCHC: 33 g/dL (ref 30.0–36.0)
MCV: 90.4 fL (ref 80.0–100.0)
Platelets: 230 10*3/uL (ref 150–400)
RBC: 4.59 MIL/uL (ref 4.22–5.81)
RDW: 12.1 % (ref 11.5–15.5)
WBC: 12.9 10*3/uL — ABNORMAL HIGH (ref 4.0–10.5)
nRBC: 0 % (ref 0.0–0.2)

## 2019-06-05 LAB — GLUCOSE, CAPILLARY
Glucose-Capillary: 214 mg/dL — ABNORMAL HIGH (ref 70–99)
Glucose-Capillary: 159 mg/dL — ABNORMAL HIGH (ref 70–99)
Glucose-Capillary: 209 mg/dL — ABNORMAL HIGH (ref 70–99)
Glucose-Capillary: 202 mg/dL — ABNORMAL HIGH (ref 70–99)
Glucose-Capillary: 159 mg/dL — ABNORMAL HIGH (ref 70–99)
Glucose-Capillary: 190 mg/dL — ABNORMAL HIGH (ref 70–99)

## 2019-06-05 LAB — LEGIONELLA PNEUMOPHILA SEROGP 1 UR AG: L. pneumophila Serogp 1 Ur Ag: NEGATIVE

## 2019-06-05 LAB — MAGNESIUM: Magnesium: 2.3 mg/dL (ref 1.7–2.4)

## 2019-06-05 LAB — TRIGLYCERIDES: Triglycerides: 243 mg/dL — ABNORMAL HIGH (ref <150)

## 2019-06-05 LAB — PROCALCITONIN: Procalcitonin: 0.2 ng/mL

## 2019-06-05 LAB — PHOSPHORUS: Phosphorus: 4.4 mg/dL (ref 2.5–4.6)

## 2019-06-05 MED ORDER — ENOXAPARIN SODIUM 80 MG/0.8ML ~~LOC~~ SOLN
70.0000 mg | Freq: Every day | SUBCUTANEOUS | Status: DC
  Administered 2019-06-06 – 2019-06-27 (×21): 70 mg via SUBCUTANEOUS
  Filled 2019-06-05 (×22): qty 0.8

## 2019-06-05 MED ORDER — PROPOFOL 10 MG/ML IV BOLUS: 500.0000 mg | Freq: Once | INTRAVENOUS | Status: DC

## 2019-06-05 MED ORDER — DEXMEDETOMIDINE HCL IN NACL 400 MCG/100ML IV SOLN
0.4000 ug/kg/h | INTRAVENOUS | Status: DC
  Administered 2019-06-05: 0.4 ug/kg/h via INTRAVENOUS
  Filled 2019-06-05 (×2): qty 100

## 2019-06-05 MED ORDER — LACTATED RINGERS IV BOLUS
500.0000 mL | Freq: Once | INTRAVENOUS | Status: AC
  Administered 2019-06-05: 500 mL via INTRAVENOUS

## 2019-06-05 MED ORDER — PRO-STAT SUGAR FREE PO LIQD
60.0000 mL | Freq: Three times a day (TID) | ORAL | Status: DC
  Administered 2019-06-05 – 2019-06-19 (×42): 60 mL
  Filled 2019-06-05 (×42): qty 60

## 2019-06-05 MED ORDER — FENTANYL CITRATE (PF) 100 MCG/2ML IJ SOLN
50.0000 ug | INTRAMUSCULAR | Status: DC | PRN
  Administered 2019-06-05: 200 ug via INTRAVENOUS
  Administered 2019-06-06: 100 ug via INTRAVENOUS
  Administered 2019-06-06 (×2): 200 ug via INTRAVENOUS
  Administered 2019-06-11 (×2): 50 ug via INTRAVENOUS
  Filled 2019-06-05 (×2): qty 4

## 2019-06-05 MED ORDER — SODIUM CHLORIDE 0.9% FLUSH
10.0000 mL | Freq: Two times a day (BID) | INTRAVENOUS | Status: DC
  Administered 2019-06-05 – 2019-06-27 (×43): 10 mL

## 2019-06-05 MED ORDER — PNEUMOCOCCAL VAC POLYVALENT 25 MCG/0.5ML IJ INJ
0.5000 mL | INJECTION | INTRAMUSCULAR | Status: AC
  Administered 2019-06-06: 0.5 mL via INTRAMUSCULAR
  Filled 2019-06-05: qty 0.5

## 2019-06-05 MED ORDER — ETOMIDATE 2 MG/ML IV SOLN: 40.0000 mg | Freq: Once | INTRAVENOUS | Status: DC

## 2019-06-05 MED ORDER — VECURONIUM BROMIDE 10 MG IV SOLR: 10.0000 mg | Freq: Once | INTRAVENOUS | Status: DC

## 2019-06-05 MED ORDER — INSULIN GLARGINE 100 UNIT/ML ~~LOC~~ SOLN
25.0000 [IU] | Freq: Every day | SUBCUTANEOUS | Status: DC
  Administered 2019-06-05 – 2019-06-06 (×2): 25 [IU] via SUBCUTANEOUS
  Filled 2019-06-05 (×3): qty 0.25

## 2019-06-05 MED ORDER — ATORVASTATIN CALCIUM 40 MG PO TABS
40.0000 mg | ORAL_TABLET | Freq: Every day | ORAL | Status: DC
  Administered 2019-06-06 – 2019-06-26 (×21): 40 mg
  Filled 2019-06-05 (×22): qty 1

## 2019-06-05 MED ORDER — FENTANYL CITRATE (PF) 100 MCG/2ML IJ SOLN
200.0000 ug | Freq: Once | INTRAMUSCULAR | Status: AC
  Administered 2019-06-05: 200 ug via INTRAVENOUS
  Filled 2019-06-05: qty 4

## 2019-06-05 MED ORDER — SODIUM CHLORIDE 0.9% FLUSH: 10.0000 mL | INTRAVENOUS | Status: DC | PRN

## 2019-06-05 MED ORDER — VITAL AF 1.2 CAL PO LIQD
1000.0000 mL | ORAL | Status: DC
  Administered 2019-06-05 – 2019-06-17 (×9): 1000 mL
  Filled 2019-06-05 (×6): qty 1000

## 2019-06-05 MED ORDER — FENTANYL CITRATE (PF) 100 MCG/2ML IJ SOLN: 50.0000 ug | INTRAMUSCULAR | Status: DC | PRN

## 2019-06-05 MED ORDER — CEFAZOLIN SODIUM-DEXTROSE 2-4 GM/100ML-% IV SOLN
2.0000 g | Freq: Three times a day (TID) | INTRAVENOUS | Status: AC
  Administered 2019-06-05 – 2019-06-10 (×15): 2 g via INTRAVENOUS
  Filled 2019-06-05 (×16): qty 100

## 2019-06-05 MED ORDER — MIDAZOLAM HCL 2 MG/2ML IJ SOLN
5.0000 mg | Freq: Once | INTRAMUSCULAR | Status: AC
  Administered 2019-06-09: 2 mg via INTRAVENOUS

## 2019-06-05 MED ORDER — FENTANYL 2500MCG IN NS 250ML (10MCG/ML) PREMIX INFUSION
0.0000 ug/h | INTRAVENOUS | Status: DC
  Administered 2019-06-05: 50 ug/h via INTRAVENOUS
  Administered 2019-06-06: 400 ug/h via INTRAVENOUS
  Administered 2019-06-06: 300 ug/h via INTRAVENOUS
  Administered 2019-06-06 – 2019-06-07 (×2): 175 ug/h via INTRAVENOUS
  Administered 2019-06-07: 300 ug/h via INTRAVENOUS
  Administered 2019-06-08: 225 ug/h via INTRAVENOUS
  Administered 2019-06-08: 275 ug/h via INTRAVENOUS
  Administered 2019-06-09: 50 ug/h via INTRAVENOUS
  Filled 2019-06-05 (×10): qty 250

## 2019-06-05 NOTE — Progress Notes (Signed)
[place] Progress Note Patient Name: Michael Bright DOB: 01-Sep-1956 MRN: [id number]   Date of Service  06/05/2019  HPI/Events of Note  Agitation - Request for increased sedation and bilateral soft wrist restraints.   eICU Interventions  Will order: 1. Bilateral soft wrist restraints.     Intervention Category Major Interventions: Delirium, psychosis, severe agitation - evaluation and management  Ginni Eichler Eugene 06/05/2019, 9:02 PM

## 2019-06-05 NOTE — Progress Notes (Signed)
Pharmacy Antibiotic Note  Michael Bright is a 63 y.o. male admitted on 05/30/2019 with CVA and pneumonia. Patient was initiated on Unasyn. Culture resulted for MSSA pneumonia. Pharmacy has been consulted to change to Cefazolin dosing. WBC up at 12.9. SCr 0.94- stable for admission. Afebrile. PCT 0.20.   Plan: Cefazolin 2g IV every 8 hours.  Monitor renal function, culture results, and clinical status.   Height: 6' (182.9 cm) Weight: (!) 310 lb 13.6 oz (141 kg) IBW/kg (Calculated) : 77.6  Temp (24hrs), Avg:98.7 F (37.1 C), Min:98.3 F (36.8 C), Max:99.4 F (37.4 C)  Recent Labs  Lab 06/01/19 0314 06/02/19 0449 06/03/19 0218 06/04/19 0655 06/05/19 0927  WBC 13.5* 10.6* 8.1 12.4* 12.9*  CREATININE 1.02 0.79 0.92 1.00 0.94    Estimated Creatinine Clearance: 120.2 mL/min (by C-G formula based on SCr of 0.94 mg/dL).    No Known Allergies  Antimicrobials this admission: Unasyn 8/15 Cefazolin 8/17 >>  Dose adjustments this admission:   Microbiology results: 8/15 BCx:  8/15 Sputum: MSSA 8/12 MRSA PCR: negative 8/11 COVID: negative  Thank you for allowing pharmacy to be a part of this patient's care.  Sloan Leiter, PharmD, BCPS, BCCCP Clinical Pharmacist Please refer to [facility] for [place] numbers 06/05/2019 4:24 PM

## 2019-06-05 NOTE — Progress Notes (Addendum)
NAME:  Michael Bright, MRN:  [id number], DOB:  26-Jul-1956, LOS: 6 ADMISSION DATE:  05/30/2019, CONSULTATION DATE:  05/31/19 REFERRING MD:  Myna Hidalgo  CHIEF COMPLAINT:  Secretions   Brief History   Michael Bright is a 63 y.o. 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  PCCM asked to evaluate early AM 8/12 for poor secretion management. His course has been complicated by ongoing dysphagia resulting in presumed aspiration requiring intubation.    Past Medical History  DM, HTN.  Significant Hospital Events   8/11 > admit. 8/12 > PCCM consult. 8/15 PCCM re consulted  8/15 ETT>>>  Consults:  Neuro. PCCM.  Procedures:  None.  Significant Diagnostic Tests:  CT head 8/11 > neg. MRI brain 8/11 > small left medulla infarct. Renal US 8/11 > neg. Echo 8/11 > EF 60-65%. CXR 8/12 > atx. CXR 8/15>> Streaky opacities favoring atelectasis.  Micro Data:  SARS CoV2 8/11 > neg. MRSA  05/31/2019>> Neg 8/15 Blood Cx>> 8/15 Sputum Cx rare staph >>>  Antimicrobials:  06/03/2019 Unasyn>>   Interim history/subjective:  Remains intubated  Objective:  Blood pressure 136/83, pulse 96, temperature 98.5 F (36.9 C), temperature source Axillary, resp. rate 16, height 6' (1.829 m), weight (!) 141 kg, SpO2 95 %.    Vent Mode: PSV;CPAP FiO2 (%):  [40 %] 40 % Set Rate:  [18 bmp] 18 bmp Vt Set:  [620 mL] 620 mL PEEP:  [5 cmH20] 5 cmH20 Pressure Support:  [10 cmH20] 10 cmH20 Plateau Pressure:  [18 cmH20-21 cmH20] 20 cmH20   Intake/Output Summary (Last 24 hours) at 06/05/2019 0745 Last data filed at 06/05/2019 0700 Gross per 24 hour  Intake 1595.26 ml  Output 700 ml  Net 895.26 ml   [employer]   06/03/19 0416 06/04/19 0500 06/05/19 0500  Weight: (!) 142.6 kg (!) 138.2 kg (!) 141 kg    Examination: General: Obese male on vent.  HEENT: [place]/AT, ETT in place. PERRL Neuro: Sedated. RASS -3 CV: S1, S2, RRR, No RMG  PULM: diminished R base GI: soft, bsx4 active obese Extremities: warm/dry,  1+ edema  Skin: no rashes or lesions  Assessment & Plan:   Acute Respiratory Failure suspected 2/2 Aspiration 8/15>> TF suctioned with NTS with acute decompensation New fever 102.3 - Plan - Continue Full vent support  - SBT as tolerated.  - Will need to discuss extubation plans with attending. Suspect he would tolerate extubation for pulmonary standpoint, however, aspiration risk may benefit from trach.  - VAP protocol - RASS Goal of -1 - Minimize sedation as able ( Propofol)  Fever 102.3 with suspected aspiration ( TF suctioned from airway) WBC 8.1 - Treat with Unasyn for 5 days. - Cultures pending  HypoK Hypophos - Monitor UO - Trend BMET - Replete lytes as needed - 8/17 labs pending.  Nutrition - NPO for now  - Cor track in place  DM - CBG Q 4 - SSI - increase lantus to 25 units  L medulla CVA. - Neurology is following - Plan for CIR once medically stable - Needs tracheostomy to protect airway and allow for rehab. - Plavix on hold pending trach.   Hypertriglyceridemia  - start precedex in attempt to wean off propofol  Best Practice:  Diet: NPO, TF Pain/Anxiety/Delirium protocol (if indicated): per protocol VAP protocol (if indicated): in place DVT prophylaxis: SCD's / Enoxaparin. GI prophylaxis: Pepcid Glucose control: SSI. Mobility: Bedrest. Code Status: Full. Family Communication: No family at bedside.  Disposition: ICU  Labs  CBC: Recent Labs  Lab 05/30/19 0151 06/01/19 0314 06/02/19 0449 06/03/19 0154 06/03/19 0218 06/04/19 0655  WBC 8.9 13.5* 10.6*  --  8.1 12.4*  NEUTROABS 6.2 10.5*  --   --   --   --   HGB 16.2 14.3 14.2 13.9 13.8 14.5  HCT 49.4 43.8 44.7 41.0 41.6 42.7  MCV 91.7 92.4 94.7  --  91.2 89.3  PLT 240 222 224  --  239 232   Basic Metabolic Panel: Recent Labs  Lab 05/31/19 0807 06/01/19 0314  06/01/19 1652 06/02/19 0449 06/02/19 1627 06/03/19 0154 06/03/19 0218 06/04/19 0655  NA 136 137  --   --  140  --  138  138 137  K 4.1 4.5  --   --  4.4  --  4.8 4.6 3.3*  CL 101 105  --   --  103  --   --  100 102  CO2 25 22  --   --  29  --   --  28 24  GLUCOSE 206* 225*  --   --  176*  --   --  258* 171*  BUN 21 18  --   --  13  --   --  18 22  CREATININE 1.08 1.02  --   --  0.79  --   --  0.92 1.00  CALCIUM 8.9 8.2*  --   --  8.5*  --   --  9.0 8.6*  MG 2.4  --    < > 2.5* 2.6* 2.5*  --  2.5* 2.3  PHOS 4.2  --    < > 3.1 3.0 2.9  --  3.0 1.9*   < > = values in this interval not displayed.   GFR: Estimated Creatinine Clearance: 113 mL/min (by C-G formula based on SCr of 1 mg/dL). Recent Labs  Lab 06/01/19 0314 06/02/19 0449 06/03/19 0218 06/03/19 1301 06/04/19 0655 06/05/19 0431  PROCALCITON  --   --   --  0.21 0.24 0.20  WBC 13.5* 10.6* 8.1  --  12.4*  --    Liver Function Tests: Recent Labs  Lab 05/30/19 0151 05/31/19 0807 06/04/19 0655  AST 29 28 27   ALT 30 31 31   ALKPHOS 77 82 71  BILITOT 1.2 1.0 0.5  PROT 8.5* 7.5 6.7  ALBUMIN 4.2 3.7 2.7*   No results for input(s): LIPASE, AMYLASE in the last 168 hours. No results for input(s): AMMONIA in the last 168 hours. ABG    Component Value Date/Time   PHART 7.384 06/03/2019 0154   PCO2ART 55.2 (H) 06/03/2019 0154   PO2ART 227.0 (H) 06/03/2019 0154   HCO3 32.3 (H) 06/03/2019 0154   TCO2 34 (H) 06/03/2019 0154   O2SAT 100.0 06/03/2019 0154    Coagulation Profile: Recent Labs  Lab 05/30/19 0151  INR 1.0   Cardiac Enzymes: Recent Labs  Lab 05/30/19 1122  CKTOTAL 317   HbA1C: Hgb A1c MFr Bld  Date/Time Value Ref Range Status  05/30/2019 11:22 AM 10.6 (H) 4.8 - 5.6 % Final    Comment:    (NOTE) Pre diabetes:          5.7%-6.4% Diabetes:              >6.4% Glycemic control for   <7.0% adults with diabetes    CBG: Recent Labs  Lab 06/04/19 1215 06/04/19 1610 06/04/19 2031 06/04/19 2337 06/05/19 0332  GLUCAP 201* 142* 203* 237* 214*    [address]Paul Hoffman,  AGACNP-BC [facility] Pulmonary/Critical Care Pager  [phone] or [phone]  06/05/2019 7:52 AM

## 2019-06-05 NOTE — Progress Notes (Signed)
Patient has new onset hypotension 70's/40's. [place] paged. Precidex stopped and 500 cc bolus given.

## 2019-06-05 NOTE — Progress Notes (Signed)
STROKE TEAM PROGRESS NOTE   INTERVAL HISTORY Stroke team was asked to come back and evaluate patient as he developed respiratory distress due to inability to protect airway and was intubated.  Currently is intubated and on mild sedation.  He can be aroused and follows commands in all 4 extremities.  Vitals:   06/05/19 0700 06/05/19 0728 06/05/19 0800 06/05/19 0900  BP: 136/83  130/80 139/88  Pulse: 94 96 (!) 104   Resp: 18 16 (!) 22   Temp:   99.4 F (37.4 C)   TempSrc:   Axillary   SpO2: 97% 95% 95%   Weight:      Height:        CBC:  Recent Labs  Lab 05/30/19 0151 06/01/19 0314  06/04/19 0655 06/05/19 0927  WBC 8.9 13.5*   < > 12.4* 12.9*  NEUTROABS 6.2 10.5*  --   --   --   HGB 16.2 14.3   < > 14.5 13.7  HCT 49.4 43.8   < > 42.7 41.5  MCV 91.7 92.4   < > 89.3 90.4  PLT 240 222   < > 232 230   < > = values in this interval not displayed.    Basic Metabolic Panel:  Recent Labs  Lab 06/04/19 0655 06/05/19 0927  NA 137 138  K 3.3* 3.7  CL 102 106  CO2 24 22  GLUCOSE 171* 210*  BUN 22 19  CREATININE 1.00 0.94  CALCIUM 8.6* 8.1*  MG 2.3 2.3  PHOS 1.9* 4.4    IMAGING past 24h Dg Chest Port 1 View  Result Date: 06/04/2019 CLINICAL DATA:  Respiratory failure EXAM: PORTABLE CHEST 1 VIEW COMPARISON:  06/03/2019 FINDINGS: ET tube tip is above the carina. Feeding tube in place. The heart size appears normal. There is no pleural effusion or edema identified. No airspace opacities identified. Mild subsegmental atelectasis within the lung bases. IMPRESSION: 1. Mild bibasilar atelectasis. 2. Stable ET tube. Electronically Signed   By: Michael Bright M.D.   On: 06/04/2019 08:47    PHYSICAL EXAM    General -obese middle-aged male in no apparent distress.  Patient is intubated Ophthalmologic - fundi not visualized due to noncooperation.  Cardiovascular - Regular rate and rhythm.  Neurological Exam : Patient is intubated and sedated.  He can be aroused easily.  Follows  commands quite well consistently.  He has mild left eye ptosis.  Eyes are disconjugate with the left eye hypotrophic and exotropic.  Extraocular movements appear to be full range though he has some nystagmus on right lateral gaze.  Face is symmetric.  Tongue midline.  Motor system exam patient is able to move all 4 extremities equally well against gravity without any focal weakness.  Deep tendon reflexes appear symmetric.  Sensation appears preserved in all 4 extremities.  Plantars are both mute.  Gait not tested. ASSESSMENT/PLAN [name] is a 63 y.o. male with hx DB who stopped meds 10 yrs ago and morbid obesity presenting to [facility] ED with dysarthria, left facial droop, double vision and gait unsteadiness since Sunday 8/9.   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 Mild small vessel disease.     MRI  Small lateral L medullary infarct. Small vessel disease white matter and pons.   CTA head & neck no ELVO. Diffused intracranial atherosclerosis. Mod supraclinoid B ICAs. Mild stenosis L VA origin  2D Echo EF  60-65%. No source of embolus   LDL 135  HgbA1c 10.6  Lovenox 70 mg sq daily for VTE prophylaxis  No antithrombotic prior to admission, now on aspirin 81 mg and plavix 75mg  DAPT. Given planned trach, will stop plavix for placement. Once stable, resume plavix and continue DAPT for 3 months and then ASA alone  Therapy recommendations:  CIR  Disposition:  pending   Hypoxemic Respiratory failure Hoarseness, improving  Secondary to medullary stroke  Intubated 8/15  Needs trach for vent liberation  Possible Aspiration PNA  Secondary to medullary stroke  TM 102.3  On Unasyn  Blood Pressure  No hx HTN . BP goal normotensive  Hyperlipidemia  LDL 135, goal < 70  on no statin PTA  now on lipitor 40  Continue statin at d/c  Hypertriclyceridemia   Wean off  propofol  Start precedex  Diabetes type II, new diagnosis  Home meds:  none  HgbA1c 10.6, goal < 7.0  On lantus 25u  Dysphagia . Secondary to medullary stroke . Did not pass swallow . On TF via Cortrak . Speech on board   Other Stroke Risk Factors  ETOH use, advised to drink no more than 2 drink(s) a day  Morbid Obesity, Body mass index is 42.16 kg/m., recommend weight loss, diet and exercise as appropriate   Other Active Problems  AKI Cr 1.56->1.08->0.79 - continue gentle hydration   Hypokalemia, hypophosphatemia - replace  Hospital day # 6  I have personally obtained history,examined this patient, reviewed notes, independently viewed imaging studies, participated in medical decision making and plan of care.ROS completed by me personally and pertinent positives fully documented  I have made any additions or clarifications directly to the above note. . Patient presented with lateral medullary infarct 6 days ago and initially did well but developed respiratory distress due to likely inability to protect airway from damage from the stroke.  He will likely need prolonged ventilatory support due to airway protection issues and may benefit with early tracheostomy.  Agree with holding Plavix for 3 to 5 days prior to elective tracheostomy and continue aspirin in the interim if possible and Plavix can be resumed after the procedure.  Continue ongoing management of respiratory failure as per critical care team. Discussed with Dr. Denese KillingsAgarwala.  Greater than 50% time during this 35-minute visit was spent on counseling and coordination of care about his brainstem infarct and respiratory failure and discussion with care team on plan of care  Delia HeadyPramod Sethi, MD Medical Director [facility] Pager: [phone] 06/05/2019 4:00 PM   To contact Stroke Continuity provider, please refer to [email or web address]. After hours, contact General Neurology

## 2019-06-05 NOTE — Progress Notes (Signed)
Nutrition Follow-up  DOCUMENTATION CODES:   Morbid obesity  INTERVENTION:  Discontinue Vital High Protein  Initiate Vital AF 1.2 formula via Cortrak NGT at goal rate of 40 ml/h (960 ml per day) and Prostat 60 ml TID to provide 1752 kcals, 162 gm protein, 778 ml free water daily.  NUTRITION DIAGNOSIS:   Inadequate oral intake related to dysphagia as evidenced by NPO status; ongoing  GOAL:   Provide needs based on ASPEN/SCCM guidelines; progressing  MONITOR:   TF tolerance, Vent status, Weight trends, Labs, I & O's  REASON FOR ASSESSMENT:   Consult Enteral/tube feeding initiation and management  ASSESSMENT:   63 yo male admitted on 8/11 with L medulla CVA with dysphagia. PMH includes DM, HTN  8/12 Transferred to [place] for respiratory decompensation, unable to clear secretions 8/13 Cortrak inserted by [facility], TF initiated 8/14 Cortrak replaced (gastric)  8/15 intubated from presumed aspiration  RN contacted RD regarding pt being off propofol and need to advance feeds. Pt continues on ventilator support. RD to modify tube feeding orders to better meet nutrition needs now that pt is off propofol.   Labs and medications reviewed. Triglycerides elevated at 243.   Diet Order:   Diet Order            Diet NPO time specified  Diet effective now              EDUCATION NEEDS:   Not appropriate for education at this time  Skin:  Skin Assessment: Reviewed RN Assessment Skin Integrity Issues:: Other (Comment) Other: MASD- sacrum  Last BM:  8/12  Height:   Ht Readings from Last 1 Encounters:  06/03/19 6' (1.829 m)    Weight:   Wt Readings from Last 1 Encounters:  06/05/19 (!) 141 kg    Ideal Body Weight:  80.9 kg  BMI:  Body mass index is 42.16 kg/m.  Estimated Nutritional Needs:   Kcal:  1962-2297 kcal  Protein:  162-182 grams  Fluid:  >/= 1.9 L/day    Corrin Parker, MS, RD, LDN Pager # [phone] After hours/ weekend pager # [phone]

## 2019-06-05 NOTE — Progress Notes (Signed)
Consent for trach is signed by wife and in the patient's chart. Trach orders were placed for today, all medications rescheduled for Friday 08-21.

## 2019-06-05 NOTE — Progress Notes (Signed)
  Speech Language Pathology  Patient Details Name: Michael Bright MRN: [id number] DOB: 15-Dec-1955 Today's Date: 06/05/2019 Time:  -     Orally intubated. Critical care note states need for possible trach. Will keep on caseload several more days in the event he receives trach soon and ST intervention needed.                          Houston Siren 06/05/2019, 11:12 AM   Orbie Pyo Colvin Caroli.Ed Risk analyst [phone] Office [phone]

## 2019-06-05 NOTE — Progress Notes (Signed)
Foley catheter placed 8/16 at 0000 for urinary retention. Patient received first dose of Flomax 8/16 at 1900. Will removed foley per protocol at 48 hour mark.

## 2019-06-05 NOTE — Progress Notes (Signed)
Physical Therapy Treatment Patient Details Name: Michael Bright MRN: [id number] DOB: July 12, 1956 Today's Date: 06/05/2019    History of Present Illness Pt adm with lt sided weakness and numbness. Pt with lt medullary infarct. Intubated 8/15 due to suspected aspiration. plan for trach. PMH -  DM, HTN    PT Comments    Patient intubated 8/15 due to suspected aspiration with plans for likely trach later this week. Pt follows all commands and nods appropriately to yes/no questions. Eager to participate in therapy. Tolerated sitting EOB and performing there ex and standing with mod A of 2. Able to take 1 step along side bed with poor LLE control/foot placement and truncal instability. Nurse deferred transfer to chair. Will continue to follow and progress as tolerated.    Follow Up Recommendations  CIR     Equipment Recommendations  Other (comment)(defer)    Recommendations for Other Services       Precautions / Restrictions Precautions Precautions: Fall Precaution Comments: coretrak; vent Restrictions Weight Bearing Restrictions: No    Mobility  Bed Mobility Overal bed mobility: Needs Assistance Bed Mobility: Supine to Sit;Sit to Supine     Supine to sit: Min assist;HOB elevated Sit to supine: Mod assist;HOB elevated   General bed mobility comments: Assist with trunk and use of rail. Able to square up hips  wihtout assist. Assist to bring LEs into bed to return to supine.  Transfers Overall transfer level: Needs assistance Equipment used: 2 person hand held assist Transfers: Sit to/from Stand Sit to Stand: Mod assist;+2 physical assistance         General transfer comment: Assist of 2 to power to standing with right lateral lean; Impulsive to stand.  Ambulation/Gait Ambulation/Gait assistance: Mod assist;Max assist;+2 physical assistance Gait Distance (Feet): 1 Feet Assistive device: 2 person hand held assist Gait Pattern/deviations: Step-to pattern Gait velocity:  decr   General Gait Details: Attempted to take a few steps towards the left along side the bed impulsively; assist for trunk, balance and poor placement of LLE.   Stairs             Wheelchair Mobility    Modified Rankin (Stroke Patients Only) Modified Rankin (Stroke Patients Only) Pre-Morbid Rankin Score: No symptoms Modified Rankin: Moderately severe disability     Balance Overall balance assessment: Needs assistance Sitting-balance support: No upper extremity supported;Feet supported Sitting balance-Leahy Scale: Fair     Standing balance support: During functional activity Standing balance-Leahy Scale: Poor Standing balance comment: External support for standing balance.                            Cognition Arousal/Alertness: Lethargic Behavior During Therapy: Flat affect Overall Cognitive Status: Difficult to assess                                 General Comments: Able to follow simple commands and nod yes/no appropriately to orientation questions. Appears WFL.      Exercises General Exercises - Lower Extremity Long Arc Quad: Both;10 reps;Seated    General Comments General comments (skin integrity, edema, etc.): BP soft but stable with MAP >60. pt on vent, PEEP 5      Pertinent Vitals/Pain Pain Assessment: Faces Faces Pain Scale: No hurt    Home Living  Prior Function            PT Goals (current goals can now be found in the care plan section) Progress towards PT goals: Not progressing toward goals - comment(due to intubation)    Frequency    Min 3X/week      PT Plan Frequency needs to be updated    Co-evaluation              AM-PAC PT "6 Clicks" Mobility   Outcome Measure  Help needed turning from your back to your side while in a flat bed without using bedrails?: None Help needed moving from lying on your back to sitting on the side of a flat bed without using bedrails?:  A Little Help needed moving to and from a bed to a chair (including a wheelchair)?: A Lot Help needed standing up from a chair using your arms (e.g., wheelchair or bedside chair)?: A Lot Help needed to walk in hospital room?: Total Help needed climbing 3-5 steps with a railing? : Total 6 Click Score: 13    End of Session Equipment Utilized During Treatment: Gait belt;Other (comment)(vent) Activity Tolerance: Patient tolerated treatment well Patient left: in bed;with call bell/phone within reach;with SCD's reapplied;with bed alarm set Nurse Communication: Mobility status PT Visit Diagnosis: Unsteadiness on feet (R26.81);Hemiplegia and hemiparesis Hemiplegia - Right/Left: Left Hemiplegia - dominant/non-dominant: Non-dominant Hemiplegia - caused by: Cerebral infarction     Time: 1430-1445 PT Time Calculation (min) (ACUTE ONLY): 15 min  Charges:  $Therapeutic Activity: 8-22 mins                     Wray Kearns, PT, DPT Acute Rehabilitation Services Pager [phone] Office [place] 06/05/2019, 3:53 PM

## 2019-06-06 LAB — GLUCOSE, CAPILLARY
Glucose-Capillary: 178 mg/dL — ABNORMAL HIGH (ref 70–99)
Glucose-Capillary: 187 mg/dL — ABNORMAL HIGH (ref 70–99)
Glucose-Capillary: 199 mg/dL — ABNORMAL HIGH (ref 70–99)
Glucose-Capillary: 212 mg/dL — ABNORMAL HIGH (ref 70–99)
Glucose-Capillary: 222 mg/dL — ABNORMAL HIGH (ref 70–99)
Glucose-Capillary: 240 mg/dL — ABNORMAL HIGH (ref 70–99)
Glucose-Capillary: 254 mg/dL — ABNORMAL HIGH (ref 70–99)

## 2019-06-06 LAB — CBC
HCT: 42.4 % (ref 39.0–52.0)
Hemoglobin: 14.2 g/dL (ref 13.0–17.0)
MCH: 30.3 pg (ref 26.0–34.0)
MCHC: 33.5 g/dL (ref 30.0–36.0)
MCV: 90.4 fL (ref 80.0–100.0)
Platelets: 226 10*3/uL (ref 150–400)
RBC: 4.69 MIL/uL (ref 4.22–5.81)
RDW: 12 % (ref 11.5–15.5)
WBC: 12.6 10*3/uL — ABNORMAL HIGH (ref 4.0–10.5)
nRBC: 0 % (ref 0.0–0.2)

## 2019-06-06 LAB — BASIC METABOLIC PANEL
Anion gap: 9 (ref 5–15)
BUN: 17 mg/dL (ref 8–23)
CO2: 23 mmol/L (ref 22–32)
Calcium: 8.3 mg/dL — ABNORMAL LOW (ref 8.9–10.3)
Chloride: 107 mmol/L (ref 98–111)
Creatinine, Ser: 1.01 mg/dL (ref 0.61–1.24)
GFR calc Af Amer: >60 mL/min (ref >60)
GFR calc non Af Amer: >60 mL/min (ref >60)
Glucose, Bld: 196 mg/dL — ABNORMAL HIGH (ref 70–99)
Potassium: 3.5 mmol/L (ref 3.5–5.1)
Sodium: 139 mmol/L (ref 135–145)

## 2019-06-06 LAB — TRIGLYCERIDES: Triglycerides: 184 mg/dL — ABNORMAL HIGH (ref <150)

## 2019-06-06 MED ORDER — CHLORHEXIDINE GLUCONATE CLOTH 2 % EX PADS
6.0000 | MEDICATED_PAD | Freq: Every day | CUTANEOUS | Status: DC
  Administered 2019-06-06 – 2019-06-10 (×5): 6 via TOPICAL

## 2019-06-06 MED ORDER — POTASSIUM CHLORIDE 20 MEQ/15ML (10%) PO SOLN
40.0000 meq | Freq: Once | ORAL | Status: AC
  Administered 2019-06-06: 40 meq
  Filled 2019-06-06: qty 30

## 2019-06-06 NOTE — Progress Notes (Signed)
STROKE TEAM PROGRESS NOTE   INTERVAL HISTORY Patient remains intubated for resp failure. Is on sedation with fentanyl. arousable and follows commands  Vitals:   06/06/19 0738 06/06/19 0755 06/06/19 0800 06/06/19 0900  BP:   139/70 (!) 147/84  Pulse: 99  (!) 108 (!) 112  Resp: (!) 21  18 20   Temp:  100.3 F (37.9 C)  99.3 F (37.4 C)  TempSrc:  Oral  Oral  SpO2: 98%  97% 96%  Weight:      Height:        CBC:  Recent Labs  Lab 06/01/19 0314  06/05/19 0927 06/06/19 0256  WBC 13.5*   < > 12.9* 12.6*  NEUTROABS 10.5*  --   --   --   HGB 14.3   < > 13.7 14.2  HCT 43.8   < > 41.5 42.4  MCV 92.4   < > 90.4 90.4  PLT 222   < > 230 226   < > = values in this interval not displayed.    Basic Metabolic Panel:  Recent Labs  Lab 06/04/19 0655 06/05/19 0927 06/06/19 0256  NA 137 138 139  K 3.3* 3.7 3.5  CL 102 106 107  CO2 24 22 23   GLUCOSE 171* 210* 196*  BUN 22 19 17   CREATININE 1.00 0.94 1.01  CALCIUM 8.6* 8.1* 8.3*  MG 2.3 2.3  --   PHOS 1.9* 4.4  --     IMAGING past 24h No results found.  PHYSICAL EXAM    General -obese middle-aged male in no apparent distress.  Patient is intubated Ophthalmologic - fundi not visualized due to noncooperation.  Cardiovascular - Regular rate and rhythm.  Neurological Exam : Patient is intubated and sedated.  He can be aroused easily.  Follows commands quite well consistently.  He has mild left eye ptosis.  Eyes are dysconjugate with the left eye hypotrophic and exotropic.  Extraocular movements appear to be full range though he has some nystagmus on right lateral gaze.  Face is symmetric.  Tongue midline.  Motor system exam patient is able to move all 4 extremities equally well against gravity without any focal weakness.  Deep tendon reflexes appear symmetric.  Sensation appears preserved in all 4 extremities.  Plantars are both mute.  Gait not tested. ASSESSMENT/PLAN [name] is a 63 y.o. male with hx DB who stopped meds  10 yrs ago and morbid obesity [facility] with dysarthria, left facial droop, double vision and gait unsteadiness since Sunday 8/9.   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 Mild small vessel disease.     MRI  Small lateral L medullary infarct. Small vessel disease white matter and pons.   CTA head & neck no ELVO. Diffused intracranial atherosclerosis. Mod supraclinoid B ICAs. Mild stenosis L VA origin  2D Echo EF 60-65%. No source of embolus   LDL 135  HgbA1c 10.6  Lovenox 70 mg sq daily for VTE prophylaxis  No antithrombotic prior to admission, now on aspirin 81 mg and plavix 75mg  DAPT. Given planned trach, will stop plavix for placement. Once stable, resume plavix and continue DAPT for 3 months and then ASA alone  Therapy recommendations:  CIR  Disposition:  pending   Hypoxemic Respiratory failure Hoarseness, improving  Secondary to medullary stroke  Intubated 8/15  Plan trach on Friday for vent liberation  Possible Aspiration PNA MSSA PNA  Secondary to medullary stroke  TM 100.3  On Ancef  Blood cx pending   Blood Pressure  No hx HTN . BP goal normotensive  Hyperlipidemia  LDL 135, goal < 70  on no statin PTA  now on lipitor 40  Continue statin at d/c  Hypertriclyceridemia   off propofol  Diabetes type II, new diagnosis  Home meds:  none  HgbA1c 10.6, goal < 7.0  On lantus 25u  Dysphagia Malnutrition . Secondary to medullary stroke . Did not pass swallow . On TF via Cortrak . Speech on board   Other Stroke Risk Factors  ETOH use, advised to drink no more than 2 drink(s) a day  Morbid Obesity, Body mass index is 43.03 kg/m., recommend weight loss, diet and exercise as appropriate   Other Active Problems  AKI Cr 1.56->1.08->0.79 - continue gentle hydration   Hypokalemia, hypophosphatemia - replace as  needed  Hospital day # 7  Continue aspirin  Alone till tracheostomy and add plavix after trach. D/w Dr Lynetta Mare.Continue ventilatory support per PCCM This patient is critically ill and at significant risk of neurological worsening, death and care requires constant monitoring of vital signs, hemodynamics,respiratory and cardiac monitoring, extensive review of multiple databases, frequent neurological assessment, discussion with family, other specialists and medical decision making of high complexity.I have made any additions or clarifications directly to the above note.This critical care time does not reflect procedure time, or teaching time or supervisory time of PA/NP/Med Resident etc but could involve care discussion time.  I spent 30 minutes of neurocritical care time  in the care of  this patient.     Antony Contras, MD Medical Director [facility] Pager: [phone] 06/06/2019 9:44 AM   To contact Stroke Continuity provider, please refer to [email or web address] After hours, contact General Neurology

## 2019-06-06 NOTE — Progress Notes (Signed)
NAME:  Michael KilRandolph Jurney, MRN:  [id number], DOB:  31-Dec-1955, LOS: 7 ADMISSION DATE:  05/30/2019, CONSULTATION DATE:  05/31/19 REFERRING MD:  Antionette Charpyd  CHIEF COMPLAINT:  Secretions   Brief History   Michael Bright is a 63 y.o. 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  PCCM asked to evaluate early AM 8/12 for poor secretion management. His course has been complicated by ongoing dysphagia resulting in presumed aspiration requiring intubation.    Past Medical History  DM, HTN.  Significant Hospital Events   8/11 > admit. 8/12 > PCCM consult. 8/15 PCCM re consulted  8/15 ETT>>>  Consults:  Neuro. PCCM.  Procedures:  ETT 8/15>  Significant Diagnostic Tests:  CT head 8/11 > neg. MRI brain 8/11 > small left medulla infarct. Renal US 8/11 > neg. Echo 8/11 > EF 60-65%.  Micro Data:  SARS CoV2 8/11 > neg. MRSA  05/31/2019>> Neg 8/15 Blood Cx >> 8/15 Sputum Cx rare staph aureus pan sens.  >  Antimicrobials:  06/03/2019 Unasyn>> 8/17 Ancef 8/17 >  Interim history/subjective:  Remains intubated. Triglycerides high yesterday so propofol changed to dex. Hypotension on dex so stopped.   Objective:  Blood pressure 104/73, pulse 99, temperature 99.5 F (37.5 C), resp. rate 18, height 6' (1.829 m), weight (!) 143.9 kg, SpO2 96 %.    Vent Mode: PRVC FiO2 (%):  [40 %] 40 % Set Rate:  [18 bmp] 18 bmp Vt Set:  [620 mL] 620 mL PEEP:  [5 cmH20] 5 cmH20 Pressure Support:  [10 cmH20] 10 cmH20 Plateau Pressure:  [17 cmH20-21 cmH20] 21 cmH20   Intake/Output Summary (Last 24 hours) at 06/06/2019 0731 Last data filed at 06/06/2019 0700 Gross per 24 hour  Intake 2934.75 ml  Output 1100 ml  Net 1834.75 ml   Filed Weights   06/04/19 0500 06/05/19 0500 06/06/19 0500  Weight: (!) 138.2 kg (!) 141 kg (!) 143.9 kg    Examination: General: obese male on vent HEENT: [place]/AT, PERRL, no JVD Neuro: RASS -1. CV: RRR, no MRG. Borderline tachy rate 100. Sinus with PAC on tele PULM: diminished R  base GI: soft, bsx4 active obese Extremities: warm/dry, 1+ edema  Skin: no rashes or lesions  Assessment & Plan:   Acute Respiratory Failure suspected 2/2 Aspiration 8/15>> TF suctioned with NTS with acute decompensation New fever 102.3 - Continue Full vent support  - SBT as tolerated.  - Awaiting asa/plavix washout for trach. Planned for 8/21. - VAP protocol - RASS goal of -1 - Minimize sedation as able (Fenatnyl infusion)  MSSA pneumonia - Continue Ancef. Day 5 total abx. Day 1 ancef. (was previously on unasyn) - Blood cultures pending   HypoK Hypophos - Monitor UO - Trend BMET - Replete lytes as needed  Nutrition - Cor track in place for TF  DM -CBG monitoring and SSI -Lantus 25 units started 8/17 with improved glucose  L medulla CVA. - Neurology is following - Plan for CIR once medically stable - Needs tracheostomy to protect airway and allow for rehab. - Plavix on hold pending trach.  - After trach restart ASA/plavix x 3 months then ASA alone.   Hypertriglyceridemia  - Propofol off. Continue to monitor.   Best Practice:  Diet: NPO, TF Pain/Anxiety/Delirium protocol (if indicated): per protocol VAP protocol (if indicated): in place DVT prophylaxis: SCD's / Enoxaparin. GI prophylaxis: Pepcid Glucose control: SSI. Mobility: Bedrest. Code Status: Full. Family Communication: No family at bedside.  Disposition: ICU  Labs   CBC:  Recent Labs  Lab 06/01/19 0314 06/02/19 0449 06/03/19 0154 06/03/19 0218 06/04/19 0655 06/05/19 0927 06/06/19 0256  WBC 13.5* 10.6*  --  8.1 12.4* 12.9* 12.6*  NEUTROABS 10.5*  --   --   --   --   --   --   HGB 14.3 14.2 13.9 13.8 14.5 13.7 14.2  HCT 43.8 44.7 41.0 41.6 42.7 41.5 42.4  MCV 92.4 94.7  --  91.2 89.3 90.4 90.4  PLT 222 224  --  239 232 230 622   Basic Metabolic Panel: Recent Labs  Lab 06/02/19 0449 06/02/19 1627 06/03/19 0154 06/03/19 0218 06/04/19 0655 06/05/19 0927 06/06/19 0256  NA 140  --  138  138 137 138 139  K 4.4  --  4.8 4.6 3.3* 3.7 3.5  CL 103  --   --  100 102 106 107  CO2 29  --   --  28 24 22 23   GLUCOSE 176*  --   --  258* 171* 210* 196*  BUN 13  --   --  18 22 19 17   CREATININE 0.79  --   --  0.92 1.00 0.94 1.01  CALCIUM 8.5*  --   --  9.0 8.6* 8.1* 8.3*  MG 2.6* 2.5*  --  2.5* 2.3 2.3  --   PHOS 3.0 2.9  --  3.0 1.9* 4.4  --    GFR: Estimated Creatinine Clearance: 113.1 mL/min (by C-G formula based on SCr of 1.01 mg/dL). Recent Labs  Lab 06/03/19 0218 06/03/19 1301 06/04/19 0655 06/05/19 0431 06/05/19 0927 06/06/19 0256  PROCALCITON  --  0.21 0.24 0.20  --   --   WBC 8.1  --  12.4*  --  12.9* 12.6*   Liver Function Tests: Recent Labs  Lab 05/31/19 0807 06/04/19 0655 06/05/19 0927  AST 28 27 24   ALT 31 31 27   ALKPHOS 82 71 71  BILITOT 1.0 0.5 0.3  PROT 7.5 6.7 6.5  ALBUMIN 3.7 2.7* 2.5*   No results for input(s): LIPASE, AMYLASE in the last 168 hours. No results for input(s): AMMONIA in the last 168 hours. ABG    Component Value Date/Time   PHART 7.384 06/03/2019 0154   PCO2ART 55.2 (H) 06/03/2019 0154   PO2ART 227.0 (H) 06/03/2019 0154   HCO3 32.3 (H) 06/03/2019 0154   TCO2 34 (H) 06/03/2019 0154   O2SAT 100.0 06/03/2019 0154    Coagulation Profile: No results for input(s): INR, PROTIME in the last 168 hours. Cardiac Enzymes: Recent Labs  Lab 05/30/19 1122  CKTOTAL 317   HbA1C: Hgb A1c MFr Bld  Date/Time Value Ref Range Status  05/30/2019 11:22 AM 10.6 (H) 4.8 - 5.6 % Final    Comment:    (NOTE) Pre diabetes:          5.7%-6.4% Diabetes:              >6.4% Glycemic control for   <7.0% adults with diabetes    CBG: Recent Labs  Lab 06/05/19 1535 06/05/19 2014 06/05/19 2016 06/06/19 0011 06/06/19 0350  GLUCAP 202* 190* 159* 178* 187*    Critical care time: 35 mins. This patient is critically ill due to acute respiratory failure in the setting of aspiration pneumonia/ MSSA pneumonia and left medullary CVA, requiring  mechanical ventilation.   Georgann Housekeeper, AGACNP-BC [place] Pager [phone] or [phone]  06/06/2019 7:31 AM

## 2019-06-06 NOTE — Progress Notes (Signed)
Inpatient Rehabilitation-Admissions Coordinator   Pt on vent with plans for tracheostomy. AC will continue to follow for medical readiness.   Please call if questions.   Jhonnie Garner, OTR/L  Rehab Admissions Coordinator  [phone] 06/06/2019 12:39 PM

## 2019-06-07 LAB — CBC
HCT: 38.3 % — ABNORMAL LOW (ref 39.0–52.0)
Hemoglobin: 12.4 g/dL — ABNORMAL LOW (ref 13.0–17.0)
MCH: 30.1 pg (ref 26.0–34.0)
MCHC: 32.4 g/dL (ref 30.0–36.0)
MCV: 93 fL (ref 80.0–100.0)
Platelets: 238 10*3/uL (ref 150–400)
RBC: 4.12 MIL/uL — ABNORMAL LOW (ref 4.22–5.81)
RDW: 12.1 % (ref 11.5–15.5)
WBC: 11.9 10*3/uL — ABNORMAL HIGH (ref 4.0–10.5)
nRBC: 0 % (ref 0.0–0.2)

## 2019-06-07 LAB — BASIC METABOLIC PANEL
Anion gap: 8 (ref 5–15)
BUN: 20 mg/dL (ref 8–23)
CO2: 23 mmol/L (ref 22–32)
Calcium: 8.2 mg/dL — ABNORMAL LOW (ref 8.9–10.3)
Chloride: 108 mmol/L (ref 98–111)
Creatinine, Ser: 0.88 mg/dL (ref 0.61–1.24)
GFR calc Af Amer: >60 mL/min (ref >60)
GFR calc non Af Amer: >60 mL/min (ref >60)
Glucose, Bld: 187 mg/dL — ABNORMAL HIGH (ref 70–99)
Potassium: 3.9 mmol/L (ref 3.5–5.1)
Sodium: 139 mmol/L (ref 135–145)

## 2019-06-07 LAB — GLUCOSE, CAPILLARY
Glucose-Capillary: 183 mg/dL — ABNORMAL HIGH (ref 70–99)
Glucose-Capillary: 201 mg/dL — ABNORMAL HIGH (ref 70–99)
Glucose-Capillary: 167 mg/dL — ABNORMAL HIGH (ref 70–99)
Glucose-Capillary: 200 mg/dL — ABNORMAL HIGH (ref 70–99)
Glucose-Capillary: 161 mg/dL — ABNORMAL HIGH (ref 70–99)

## 2019-06-07 LAB — MAGNESIUM: Magnesium: 2.4 mg/dL (ref 1.7–2.4)

## 2019-06-07 LAB — PHOSPHORUS: Phosphorus: 2.2 mg/dL — ABNORMAL LOW (ref 2.5–4.6)

## 2019-06-07 MED ORDER — INSULIN GLARGINE 100 UNIT/ML ~~LOC~~ SOLN
27.0000 [IU] | Freq: Every day | SUBCUTANEOUS | Status: DC
  Administered 2019-06-07 – 2019-06-08 (×2): 27 [IU] via SUBCUTANEOUS
  Filled 2019-06-07 (×3): qty 0.27

## 2019-06-07 MED ORDER — POTASSIUM & SODIUM PHOSPHATES 280-160-250 MG PO PACK
1.0000 | PACK | Freq: Three times a day (TID) | ORAL | Status: AC
  Administered 2019-06-07 (×3): 1 via ORAL
  Filled 2019-06-07 (×4): qty 1

## 2019-06-07 MED ORDER — INSULIN ASPART 100 UNIT/ML ~~LOC~~ SOLN
3.0000 [IU] | SUBCUTANEOUS | Status: DC
  Administered 2019-06-07 – 2019-06-12 (×30): 3 [IU] via SUBCUTANEOUS

## 2019-06-07 MED ORDER — LORAZEPAM 2 MG/ML IJ SOLN
2.0000 mg | Freq: Once | INTRAMUSCULAR | Status: AC
  Administered 2019-06-07: 2 mg via INTRAVENOUS

## 2019-06-07 MED ORDER — LORAZEPAM 2 MG/ML IJ SOLN
INTRAMUSCULAR | Status: AC
  Administered 2019-06-07: 14:00:00 2 mg via INTRAVENOUS
  Filled 2019-06-07: qty 1

## 2019-06-07 NOTE — Progress Notes (Signed)
NAME:  Michael Bright, MRN:  [id number], DOB:  [birth year], LOS: 8 ADMISSION DATE:  05/30/2019, CONSULTATION DATE:  05/31/19 REFERRING MD:  Myna Hidalgo  CHIEF COMPLAINT:  Secretions   Brief History   Michael Bright is a 63 y.o. 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  PCCM asked to evaluate early AM 8/12 for poor secretion management. His course has been complicated by ongoing dysphagia resulting in presumed aspiration requiring intubation.   Past Medical History  DM, HTN.  Significant Hospital Events   8/11 > admit. 8/12 > PCCM consult. 8/15 PCCM re consulted  8/15 ETT>>>  Consults:  Neuro. PCCM.  Procedures:  ETT 8/15>  Significant Diagnostic Tests:  CT head 8/11 > neg. MRI brain 8/11 > small left medulla infarct. Renal US 8/11 > neg. Echo 8/11 > EF 60-65%.  Micro Data:  SARS CoV2 8/11 > Neg. MRSA  05/31/2019>> Neg 8/15 Blood Cx >>NGTD 8/15 Sputum Cx rare staph aureus pan sens.    Antimicrobials:  06/03/2019 Unasyn>> 8/17 Ancef 8/17 >  Interim history/subjective:    Objective:  Blood pressure (!) 140/95, pulse 69, temperature 99.1 F (37.3 C), temperature source Axillary, resp. rate 18, height 6' (1.829 m), weight (!) 144 kg, SpO2 99 %.    Vent Mode: PRVC FiO2 (%):  [40 %-50 %] 40 % Set Rate:  [18 bmp] 18 bmp Vt Set:  [620 mL] 620 mL PEEP:  [5 cmH20] 5 cmH20 Pressure Support:  [8 cmH20] 8 cmH20 Plateau Pressure:  [18 cmH20-21 cmH20] 21 cmH20   Intake/Output Summary (Last 24 hours) at 06/07/2019 0729 Last data filed at 06/07/2019 0700 Gross per 24 hour  Intake 2307.47 ml  Output 1940 ml  Net 367.47 ml   Filed Weights   06/05/19 0500 06/06/19 0500 06/07/19 0500  Weight: (!) 141 kg (!) 143.9 kg (!) 144 kg    Examination: General: Obese, well-developed, intubated, NAD HENT: Normocephalic, PERRL. Moist mucus membranes Neck: No JVD. Trachea midline.  CV: RRR. S1S2. No MRG. +2 distal pulses Lungs: BBS present, clear, FNL, symmetrical, vent supported  ABD:  +BS x4. SNT/ND. No masses, guarding or rigidity GU: Foley EXT: MAE well. No edema Skin: PWD. In tact. No rashes or lesions Neuro: RASS -1. alert to verbal stimuli. Follows commands x4. Denies pain      Assessment & Plan:   Acute Respiratory Failure suspected 2/2 Aspiration 8/15>> TF suctioned with NTS with acute decompensation Improving fever curve -Continue ventilator support to prevent eminent deterioration and further organ dysfunction from hypoxemia and hypercarbia.   -Patient is at risk for sudden hypoxia, barotrauma and hemodynamic compromise.   -Maintain SpO2 greater than or equal to 90%. -Head of bed elevated 30 degrees. -Plateau pressures less than 30 cm H20.  -Follow chest x-ray, ABG prn -SAT/SBT as tolerated. -Bronchial hygiene. -RT/bronchodilator protocol. - Awaiting ASA/plavix washout for trach. Planned for 8/21. - VAP protocol - RASS goal of 0 to -1 - Minimize sedation as able (Fenatnyl infusion)  MSSA pneumonia - Continue Ancef. Day 5 total abx. Day 2 ancef. (was previously on unasyn) - Blood cultures NGTD  HypoK Hypophos - Monitor UO - Trend BMET - Replace lytes as needed  Nutrition - Cor track in place for TF pe recs - bowel regimen  DM -CBG monitoring and SSI Q4H -Increase Lantus to 27 units  L medulla CVA. - Neurology is following - Plan for CIR once medically stable - Needs tracheostomy to protect airway and allow for rehab. Pending for Friday  -  Plavix on hold pending trach.  - After trach restart ASA/plavix x 3 months then ASA alone.   Hypertriglyceridemia  - Propofol off. Continue to monitor.   Best Practice:  Diet: NPO, TF per recs Pain/Anxiety/Delirium protocol (if indicated): per protocol VAP protocol (if indicated): in place DVT prophylaxis: SCD's / Enoxaparin. GI prophylaxis: Pepcid Glucose control: SSI. Mobility: PT/OT Code Status: Full. Family Communication: No family at bedside.  Disposition: ICU  Labs and imaging for  past 72 hours reviewed in EMR.     The patient is critically ill with respiratory failure. He requires ongoing ICU for high complexity decision making, titration of high alert medications, ventilator management, titration of oxygen and interpretation of advanced monitoring.    I personally spent 35 minutes providing critical care services including personally reviewing test results, discussing care with nursing staff/other physicians and completing orders pertaining to this patient.  Time was exclusive to the patient and does not include time spent teaching or in procedures.  Voice recognition software was used in the production of this record.  Errors in interpretation may have been inadvertently missed during review.  Karin Lieuhonda Nino Amano, MSN, AGACNP  Pager [phone] or if no answer [phone] [facility]

## 2019-06-07 NOTE — Progress Notes (Signed)
STROKE TEAM PROGRESS NOTE   INTERVAL HISTORY Patient's remain intubated and on ventilatory support for respiratory failure due to his medullary infarct and inability to handle secretions.  He is awake alert and interactive and follows commands well.  Vital signs are stable.  Plan is for elective tracheostomy coming Friday  Vitals:   06/07/19 0403 06/07/19 0500 06/07/19 0600 06/07/19 0700  BP:  (!) 158/81 128/79 (!) 140/95  Pulse: 74 91 76 69  Resp: 18 (!) 21 19 18   Temp:      TempSrc:      SpO2: 98% 98% 98% 99%  Weight:  (!) 144 kg    Height:        CBC:  Recent Labs  Lab 06/01/19 0314  06/06/19 0256 06/07/19 0455  WBC 13.5*   < > 12.6* 11.9*  NEUTROABS 10.5*  --   --   --   HGB 14.3   < > 14.2 12.4*  HCT 43.8   < > 42.4 38.3*  MCV 92.4   < > 90.4 93.0  PLT 222   < > 226 238   < > = values in this interval not displayed.    Basic Metabolic Panel:  Recent Labs  Lab 06/05/19 0927 06/06/19 0256 06/07/19 0455  NA 138 139 139  K 3.7 3.5 3.9  CL 106 107 108  CO2 22 23 23   GLUCOSE 210* 196* 187*  BUN 19 17 20   CREATININE 0.94 1.01 0.88  CALCIUM 8.1* 8.3* 8.2*  MG 2.3  --  2.4  PHOS 4.4  --  2.2*    IMAGING past 24h No results found.  PHYSICAL EXAM     General -obese middle-aged male in no apparent distress.  Patient is intubated Ophthalmologic - fundi not visualized due to noncooperation.  Cardiovascular - Regular rate and rhythm.  Neurological Exam : Patient is intubated and sedated.  He is awake alert and interactive  .  Follows commands quite well consistently.  He has mild left eye ptosis.  Eyes are dysconjugate with the left eye hypotrophic and exotropic.  Extraocular movements appear to be full range though he has some nystagmus on right lateral gaze with saccadic dysmetria on horizontal gaze..  Face is symmetric.  Tongue midline.  Motor system exam patient is able to move all 4 extremities equally well against gravity without any focal weakness.  Deep  tendon reflexes appear symmetric.  Sensation appears preserved in all 4 extremities.  Plantars are both mute.  Gait not tested.   ASSESSMENT/PLAN [name] is a 63 y.o. male with hx DB who stopped meds 10 yrs ago and morbid obesity [facility] with dysarthria, left facial droop, double vision and gait unsteadiness since Sunday 8/9.    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 Mild small vessel disease.     MRI  Small lateral L medullary infarct. Small vessel disease white matter and pons.   CTA head & neck no ELVO. Diffuse intracranial atherosclerosis. Mod supraclinoid B ICAs. Mild stenosis L VA origin  2D Echo EF 60-65%. No source of embolus   LDL 135  HgbA1c 10.6  Lovenox 70 mg sq daily for VTE prophylaxis  No antithrombotic prior to admission, now on aspirin 81 mg and plavix 75mg  DAPT. Given planned trach, will stop plavix for placement. Once stable, resume plavix and continue DAPT for 3 months and then ASA alone  Therapy recommendations:  CIR  Disposition:  pending   Hypoxemic Respiratory failure Hoarseness, improving  Secondary to medullary stroke  Intubated 8/15  Plan trach on Friday for vent liberation  Possible Aspiration PNA MSSA PNA  Secondary to medullary stroke  TM 100.6  On Ancef  Blood cx no growth   Trach cx staph aureus  Blood Pressure  No hx HTN . BP goal normotensive  Hyperlipidemia  LDL 135, goal < 70  on no statin PTA  now on lipitor 40  Continue statin at d/c  Hypertriclyceridemia   off propofol  Diabetes type II, new diagnosis  Home meds:  none  HgbA1c 10.6, goal < 7.0  On lantus 25u  DB RN recommends TF coverage  Dysphagia Malnutrition . Secondary to medullary stroke . Did not pass swallow . On TF via Cortrak . Speech on board   Other Stroke Risk Factors  ETOH use, advised to drink no more than  2 drink(s) a day  Morbid Obesity, Body mass index is 43.06 kg/m., recommend weight loss, diet and exercise as appropriate   Other Active Problems  AKI, resolved Cr 1.56->1.08->0.79  Hypokalemia, hypophosphatemia - replace as needed  Hospital day # 8  Continue aspirin for stroke prevention and aggressive risk factor modification.  Continue ventilatory support for respiratory failure.  Elective tracheostomy in next few days.  Mobilize out of bed.  Physical occupational therapy.  Anticipate eventual transfer to rehab when off ventilatory support.  Discussed with patient and Dr. Lynetta Mare and answered questions. This patient is critically ill and at significant risk of neurological worsening, death and care requires constant monitoring of vital signs, hemodynamics,respiratory and cardiac monitoring, extensive review of multiple databases, frequent neurological assessment, discussion with family, other specialists and medical decision making of high complexity.I have made any additions or clarifications directly to the above note.This critical care time does not reflect procedure time, or teaching time or supervisory time of PA/NP/Med Resident etc but could involve care discussion time.  I spent 30 minutes of neurocritical care time  in the care of  this patient.      Antony Contras, MD Medical Director [facility] Pager: [phone] 06/07/2019 8:29 AM   To contact Stroke Continuity provider, please refer to [email or web address] After hours, contact General Neurology

## 2019-06-07 NOTE — Progress Notes (Signed)
Physical Therapy Treatment Patient Details Name: Michael Bright MRN: [id number] DOB: 1955-10-31 Today's Date: 06/07/2019    History of Present Illness Pt adm with lt sided weakness and numbness. Pt with lt medullary infarct. Intubated 8/15 due to suspected aspiration. plan for trach. PMH -  DM, HTN    PT Comments    Pt seen on vent, 50% FiO2, 5 PEEP. Able to progress to transferring today to chair with three person maximal assist (3rd person for vent and line management). Pt following simple commands, nodding yes/no appropriately. Continues with truncal instability, generalized weakness, poor balance. Pt remains very motivated and eager to participate in therapy. Continue to recommend CIR once medically appropriate.    Follow Up Recommendations  CIR     Equipment Recommendations  Other (comment)(defer)    Recommendations for Other Services       Precautions / Restrictions Precautions Precautions: Fall;Other (comment) Precaution Comments: cortrak, vent Restrictions Weight Bearing Restrictions: No    Mobility  Bed Mobility Overal bed mobility: Needs Assistance Bed Mobility: Supine to Sit     Supine to sit: Mod assist;+2 for safety/equipment;+2 for physical assistance     General bed mobility comments: Pt able to bring BLE's off edge of bed, modA for trunk elevation up to sitting.   Transfers Overall transfer level: Needs assistance Equipment used: 2 person hand held assist Transfers: Sit to/from [employer] CorporationStand;Stand Pivot Transfers Sit to Stand: Max assist;+2 physical assistance;+2 safety/equipment Stand pivot transfers: Max assist;+2 physical assistance;+2 safety/equipment(+3 for vent management)       General transfer comment: Initially attempted handheld assist to power up, but pt with difficulty achieving anterior weight shift and sat back down on edge of bed. Cues for repositioning hips posteriorly on edge of bed. Then stood from edge of bed with bilateral hands pushing  off from bed, pivoting towards right to chair, trunk in flexed position maxA + 3 for safety/management of vent and lines.   Ambulation/Gait                 Stairs             Wheelchair Mobility    Modified Rankin (Stroke Patients Only) Modified Rankin (Stroke Patients Only) Pre-Morbid Rankin Score: No symptoms Modified Rankin: Severe disability     Balance Overall balance assessment: Needs assistance Sitting-balance support: Feet supported;Single extremity supported Sitting balance-Leahy Scale: Fair Sitting balance - Comments: Min guard for safety, can take minimal challenge, single UE functional reaching but unable to significantly weight shift     Standing balance-Leahy Scale: Zero                              Cognition Arousal/Alertness: Awake/alert Behavior During Therapy: Flat affect Overall Cognitive Status: Difficult to assess                                 General Comments: Mildly impulsive, needing safety cues. Following simple commands, a couple instances of reaching up with hand to ETT, replaced mittens      Exercises General Exercises - Lower Extremity Long Arc Quad: 5 reps;Both;Seated Other Exercises Other Exercises: Functional reaching with RUE     General Comments  SpO2 93% on vent (50% FiO2, 5 PEEP), HR peak 118 bpm      Pertinent Vitals/Pain Pain Assessment: Faces Faces Pain Scale: No hurt    Home Living  Prior Function            PT Goals (current goals can now be found in the care plan section) Acute Rehab PT Goals Patient Stated Goal: nodding head yes to getting over to chair Potential to Achieve Goals: Good Progress towards PT goals: Progressing toward goals    Frequency    Min 3X/week      PT Plan Current plan remains appropriate    Co-evaluation PT/OT/SLP Co-Evaluation/Treatment: Yes Reason for Co-Treatment: Complexity of the patient's impairments  (multi-system involvement);Necessary to address cognition/behavior during functional activity;For patient/therapist safety;To address functional/ADL transfers PT goals addressed during session: Mobility/safety with mobility        AM-PAC PT "6 Clicks" Mobility   Outcome Measure  Help needed turning from your back to your side while in a flat bed without using bedrails?: A Little Help needed moving from lying on your back to sitting on the side of a flat bed without using bedrails?: A Lot Help needed moving to and from a bed to a chair (including a wheelchair)?: Total Help needed standing up from a chair using your arms (e.g., wheelchair or bedside chair)?: Total Help needed to walk in hospital room?: Total Help needed climbing 3-5 steps with a railing? : Total 6 Click Score: 9    End of Session Equipment Utilized During Treatment: Gait belt;Other (comment)(vent) Activity Tolerance: Patient tolerated treatment well Patient left: in chair;with call bell/phone within reach;with chair alarm set Nurse Communication: Mobility status PT Visit Diagnosis: Unsteadiness on feet (R26.81);Hemiplegia and hemiparesis Hemiplegia - Right/Left: Left Hemiplegia - dominant/non-dominant: Non-dominant Hemiplegia - caused by: Cerebral infarction     Time: 2423-5361 PT Time Calculation (min) (ACUTE ONLY): 31 min  Charges:  $Therapeutic Activity: 8-22 mins                     .Ellamae Sia, PT, DPT Acute Rehabilitation Services Pager [phone] Office [phone]    Willy Eddy 06/07/2019, 11:08 AM

## 2019-06-07 NOTE — Progress Notes (Signed)
Occupational Therapy Treatment Patient Details Name: Michael Bright MRN: [id number] DOB: [birth year] Today's Date: 06/07/2019    History of present illness Pt adm with lt sided weakness and numbness. Pt with lt medullary infarct. Intubated 8/15 due to suspected aspiration. plan for trach. PMH -  DM, HTN   OT comments  Upon arrival, pt awake and supine in bed on vent; 50% FiO2, 5 PEEP. Pt performing bed mobility with Mod A +2 to sit at EOB. While sitting at EOB, pt motivated to participate in BLEs exercises. Pt performing stand pivot to recliner with Max A +3; additional assistance for line management. Continue to recommend dc to CIR and will continue to follow acutely as admitted.    Follow Up Recommendations  CIR    Equipment Recommendations  Other (comment)(Defer to next venue)    Recommendations for Other Services      Precautions / Restrictions Precautions Precautions: Fall;Other (comment) Precaution Comments: cortrak, vent Restrictions Weight Bearing Restrictions: No       Mobility Bed Mobility Overal bed mobility: Needs Assistance Bed Mobility: Supine to Sit     Supine to sit: Mod assist;+2 for safety/equipment;+2 for physical assistance     General bed mobility comments: Pt able to bring BLE's off edge of bed, modA for trunk elevation up to sitting.   Transfers Overall transfer level: Needs assistance Equipment used: 2 person hand held assist Transfers: Sit to/from [facility] Sit to Stand: Max assist;+2 physical assistance;+2 safety/equipment Stand pivot transfers: Max assist;+2 physical assistance;+2 safety/equipment(+3 for vent management)       General transfer comment: Initially attempted handheld assist to power up, but pt with difficulty achieving anterior weight shift and sat back down on edge of bed. Cues for repositioning hips posteriorly on edge of bed. Then stood from edge of bed with bilateral hands pushing off from bed, pivoting  towards right to chair, trunk in flexed position maxA + 3 for safety/management of vent and lines.     Balance Overall balance assessment: Needs assistance Sitting-balance support: Feet supported;Single extremity supported Sitting balance-Leahy Scale: Fair Sitting balance - Comments: Min guard for safety, can take minimal challenge, single UE functional reaching but unable to significantly weight shift     Standing balance-Leahy Scale: Zero                             ADL either performed or assessed with clinical judgement   ADL Overall ADL's : Needs assistance/impaired                                       General ADL Comments: Pt requiring Max-Total A for ADLs as he is on the vent and presents with decreased safety awareness.     Vision       Perception     Praxis      Cognition Arousal/Alertness: Awake/alert Behavior During Therapy: Flat affect Overall Cognitive Status: Difficult to assess                                 General Comments: Mildly impulsive, needing safety cues. Following simple commands, a couple instances of reaching up with hand to ETT, replaced mittens        Exercises Exercises: General Lower Extremity General Exercises - Lower Extremity Long Arc Quad:  Both;10 reps;Seated   Shoulder Instructions       General Comments BP sitting at EOB 104/63 and sitting in recliner 142/81    Pertinent Vitals/ Pain       Pain Assessment: Faces Faces Pain Scale: No hurt  Home Living                                          Prior Functioning/Environment              Frequency  Min 2X/week        Progress Toward Goals  OT Goals(current goals can now be found in the care plan section)  Progress towards OT goals: Progressing toward goals  Acute Rehab OT Goals Patient Stated Goal: nodding head yes to getting over to chair OT Goal Formulation: With patient Time For Goal  Achievement: 06/21/19 ADL Goals Pt Will Perform Grooming: with min assist;standing Pt Will Perform Upper Body Bathing: with min guard assist;sitting Pt Will Perform Lower Body Bathing: with min guard assist;sit to/from stand;sitting/lateral leans Pt Will Perform Upper Body Dressing: with min guard assist;sitting;standing Pt Will Perform Lower Body Dressing: with min guard assist;sitting/lateral leans;sit to/from stand Pt Will Transfer to Toilet: with min assist;bedside commode;ambulating  Plan Discharge plan remains appropriate    Co-evaluation      Reason for Co-Treatment: Complexity of the patient's impairments (multi-system involvement);Necessary to address cognition/behavior during functional activity;For patient/therapist safety;To address functional/ADL transfers PT goals addressed during session: Mobility/safety with mobility        AM-PAC OT "6 Clicks" Daily Activity     Outcome Measure   Help from another person eating meals?: Total Help from another person taking care of personal grooming?: A Little Help from another person toileting, which includes using toliet, bedpan, or urinal?: A Lot Help from another person bathing (including washing, rinsing, drying)?: A Lot Help from another person to put on and taking off regular upper body clothing?: A Lot Help from another person to put on and taking off regular lower body clothing?: A Lot 6 Click Score: 12    End of Session Equipment Utilized During Treatment: Gait belt;Other (comment)(Vent)  OT Visit Diagnosis: Unsteadiness on feet (R26.81);Other abnormalities of gait and mobility (R26.89);Muscle weakness (generalized) (M62.81);History of falling (Z91.81);Other symptoms and signs involving the nervous system (R29.898)   Activity Tolerance Patient tolerated treatment well   Patient Left in chair;with call bell/phone within reach;with chair alarm set   Nurse Communication Mobility status        Time: [id number] OT  Time Calculation (min): 32 min  Charges: OT General Charges $OT Visit: 1 Visit OT Treatments $Self Care/Home Management : 8-22 mins  Michael Bright MSOT, OTR/L Acute Rehab Pager: [phone] Office: [phone]   Michael Bright 06/07/2019, 11:52 AM

## 2019-06-07 NOTE — Progress Notes (Signed)
Inpatient Diabetes Program Recommendations  AACE/ADA: New Consensus Statement on Inpatient Glycemic Control   Target Ranges:  Prepandial:   less than 140 mg/dL      Peak postprandial:   less than 180 mg/dL (1-2 hours)      Critically ill patients:  140 - 180 mg/dL   Results for WYNNE, JURY (MRN [id number]) as of 06/07/2019 07:45  Ref. Range 06/06/2019 07:54 06/06/2019 11:37 06/06/2019 15:35 06/06/2019 20:13 06/06/2019 23:34 06/07/2019 03:33  Glucose-Capillary Latest Ref Range: 70 - 99 mg/dL 222 (H) 212 (H) 254 (H) 240 (H) 199 (H) 183 (H)    Review of Glycemic Control  Diabetes history: NO Outpatient Diabetes medications: NA Current orders for Inpatient glycemic control: Lantus 27 units daily, Novolog 0-15 units Q4H  Inpatient Diabetes Program Recommendations:   Insulin-Basal: Noted Lantus increased from 25 to 27 units daily this morning.   Insulin-Tube Feeding Coverage: Please consider ordering Novolog 3 units Q4H for tube feeding coverage. If tube feeding is stopped or held, then Novolog tube feeding coverage should be stopped or held.  Thanks, Barnie Alderman, RN, MSN, CDE Diabetes Coordinator Inpatient Diabetes Program [phone] (Team Pager from 8am to 5pm)

## 2019-06-08 DIAGNOSIS — J9600 Acute respiratory failure, unspecified whether with hypoxia or hypercapnia: Secondary | ICD-10-CM

## 2019-06-08 LAB — BASIC METABOLIC PANEL
Anion gap: 10 (ref 5–15)
BUN: 20 mg/dL (ref 8–23)
CO2: 24 mmol/L (ref 22–32)
Calcium: 8.3 mg/dL — ABNORMAL LOW (ref 8.9–10.3)
Chloride: 106 mmol/L (ref 98–111)
Creatinine, Ser: 0.86 mg/dL (ref 0.61–1.24)
GFR calc Af Amer: >60 mL/min (ref >60)
GFR calc non Af Amer: >60 mL/min (ref >60)
Glucose, Bld: 148 mg/dL — ABNORMAL HIGH (ref 70–99)
Potassium: 3.5 mmol/L (ref 3.5–5.1)
Sodium: 140 mmol/L (ref 135–145)

## 2019-06-08 LAB — CBC
HCT: 38.5 % — ABNORMAL LOW (ref 39.0–52.0)
Hemoglobin: 12.3 g/dL — ABNORMAL LOW (ref 13.0–17.0)
MCH: 29.7 pg (ref 26.0–34.0)
MCHC: 31.9 g/dL (ref 30.0–36.0)
MCV: 93 fL (ref 80.0–100.0)
Platelets: 245 10*3/uL (ref 150–400)
RBC: 4.14 MIL/uL — ABNORMAL LOW (ref 4.22–5.81)
RDW: 11.9 % (ref 11.5–15.5)
WBC: 9.8 10*3/uL (ref 4.0–10.5)
nRBC: 0 % (ref 0.0–0.2)

## 2019-06-08 LAB — CULTURE, BLOOD (ROUTINE X 2)
Culture: NO GROWTH
Culture: NO GROWTH
Special Requests: ADEQUATE
Special Requests: ADEQUATE

## 2019-06-08 LAB — GLUCOSE, CAPILLARY
Glucose-Capillary: 123 mg/dL — ABNORMAL HIGH (ref 70–99)
Glucose-Capillary: 140 mg/dL — ABNORMAL HIGH (ref 70–99)
Glucose-Capillary: 143 mg/dL — ABNORMAL HIGH (ref 70–99)
Glucose-Capillary: 146 mg/dL — ABNORMAL HIGH (ref 70–99)
Glucose-Capillary: 162 mg/dL — ABNORMAL HIGH (ref 70–99)
Glucose-Capillary: 166 mg/dL — ABNORMAL HIGH (ref 70–99)
Glucose-Capillary: 173 mg/dL — ABNORMAL HIGH (ref 70–99)

## 2019-06-08 MED ORDER — FENTANYL CITRATE (PF) 100 MCG/2ML IJ SOLN
200.0000 ug | Freq: Once | INTRAMUSCULAR | Status: DC
  Filled 2019-06-08: qty 4

## 2019-06-08 MED ORDER — PROPOFOL 10 MG/ML IV BOLUS: 500.0000 mg | Freq: Once | INTRAVENOUS | Status: DC

## 2019-06-08 MED ORDER — DEXMEDETOMIDINE HCL IN NACL 200 MCG/50ML IV SOLN
0.2000 ug/kg/h | INTRAVENOUS | Status: DC
  Administered 2019-06-08: 0.6 ug/kg/h via INTRAVENOUS
  Administered 2019-06-08: 0.8 ug/kg/h via INTRAVENOUS
  Administered 2019-06-08: 0.7 ug/kg/h via INTRAVENOUS
  Administered 2019-06-09: 0.6 ug/kg/h via INTRAVENOUS
  Filled 2019-06-08: qty 100
  Filled 2019-06-08 (×2): qty 50

## 2019-06-08 MED ORDER — VECURONIUM BROMIDE 10 MG IV SOLR
10.0000 mg | Freq: Once | INTRAVENOUS | Status: AC
  Administered 2019-06-09: 8 mg via INTRAVENOUS
  Filled 2019-06-08: qty 10

## 2019-06-08 MED ORDER — MIDAZOLAM HCL 2 MG/2ML IJ SOLN
5.0000 mg | Freq: Once | INTRAMUSCULAR | Status: AC
  Administered 2019-06-09: 2 mg via INTRAVENOUS
  Filled 2019-06-08: qty 6

## 2019-06-08 MED ORDER — ETOMIDATE 2 MG/ML IV SOLN
40.0000 mg | Freq: Once | INTRAVENOUS | Status: AC
  Administered 2019-06-09: 20 mg via INTRAVENOUS
  Filled 2019-06-08: qty 20

## 2019-06-08 MED ORDER — POTASSIUM CHLORIDE 20 MEQ PO PACK
40.0000 meq | PACK | Freq: Two times a day (BID) | ORAL | Status: AC
  Administered 2019-06-08 (×2): 40 meq via NASOGASTRIC
  Filled 2019-06-08 (×2): qty 2

## 2019-06-08 MED ORDER — HYDRALAZINE HCL 20 MG/ML IJ SOLN
10.0000 mg | Freq: Four times a day (QID) | INTRAMUSCULAR | Status: DC | PRN
  Administered 2019-06-10: 10 mg via INTRAVENOUS
  Filled 2019-06-08: qty 1

## 2019-06-08 NOTE — Progress Notes (Signed)
STROKE TEAM PROGRESS NOTE   INTERVAL HISTORY Patient remains intubated and on ventilatory support for respiratory failure due to increased secretions and difficulty protecting airway.  Neurologically is awake alert interactive and moves all 4 extremities well without focal weakness.  No changes.  Plan is for elective tracheostomy tomorrow  Vitals:   06/08/19 0600 06/08/19 0700 06/08/19 0800 06/08/19 0809  BP: (!) 162/83 (!) 150/96 (!) 153/84   Pulse: 90 98 88 95  Resp: 19 (!) 21 19 (!) 21  Temp:   99.9 F (37.7 C)   TempSrc:   Oral   SpO2: 100% 100% 97% 97%  Weight: (!) 143.3 kg     Height:        CBC:  Recent Labs  Lab 06/07/19 0455 06/08/19 0510  WBC 11.9* 9.8  HGB 12.4* 12.3*  HCT 38.3* 38.5*  MCV 93.0 93.0  PLT 238 245    Basic Metabolic Panel:  Recent Labs  Lab 06/05/19 0927  06/07/19 0455 06/08/19 0510  NA 138   < > 139 140  K 3.7   < > 3.9 3.5  CL 106   < > 108 106  CO2 22   < > 23 24  GLUCOSE 210*   < > 187* 148*  BUN 19   < > 20 20  CREATININE 0.94   < > 0.88 0.86  CALCIUM 8.1*   < > 8.2* 8.3*  MG 2.3  --  2.4  --   PHOS 4.4  --  2.2*  --    < > = values in this interval not displayed.    IMAGING past 24h No results found.  PHYSICAL EXAM       General -obese middle-aged male in no apparent distress.  Patient is intubated Ophthalmologic - fundi not visualized due to noncooperation.  Cardiovascular - Regular rate and rhythm.  Neurological Exam : Patient is intubated and sedated.  He is awake alert and interactive  .  Follows commands quite well consistently.  He has mild left eye ptosis.  Eyes are dysconjugate with the left eye hypotrophic and exotropic.  Extraocular movements appear to be full range though he has some nystagmus on right lateral gaze with saccadic dysmetria on horizontal gaze..  Face is symmetric.  Tongue midline.  Motor system exam patient is able to move all 4 extremities equally well against gravity without any focal weakness.   Deep tendon reflexes appear symmetric.  Sensation appears preserved in all 4 extremities.  Plantars are both mute.  Gait not tested.   ASSESSMENT/PLAN Michael Bright is a 63 y.o. male with hx DB who stopped meds 10 yrs ago and morbid obesity presenting [facility] with dysarthria, left facial droop, double vision and gait unsteadiness since Sunday 8/9.    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 Mild small vessel disease.     MRI  Small lateral L medullary infarct. Small vessel disease white matter and pons.   CTA head & neck no ELVO. Diffuse intracranial atherosclerosis. Mod supraclinoid B ICAs. Mild stenosis L VA origin  2D Echo EF 60-65%. No source of embolus   LDL 135  HgbA1c 10.6  Lovenox 70 mg sq daily for VTE prophylaxis  No antithrombotic prior to admission, now on aspirin 81 mg and plavix 75mg  DAPT. Given planned trach, will stop plavix for placement. Once stable, resume plavix and continue DAPT for 3 months  and then ASA alone  Therapy recommendations:  CIR  Disposition:  pending   Hypoxemic Respiratory failure Hoarseness, improving  Secondary to medullary stroke  Intubated 8/15  Plan trach on Friday for vent liberation  Possible Aspiration PNA MSSA PNA  Secondary to medullary stroke  TM 99.9  On Ancef through 8/21  Blood cx no growth   Trach cx staph aureus  Blood Pressure  No hx HTN . BP goal normotensive  Hyperlipidemia  LDL 135, goal < 70  on no statin PTA  now on lipitor 40  Continue statin at d/c  Hypertriclyceridemia   off propofol  Diabetes type II, new diagnosis  Home meds:  none  HgbA1c 10.6, goal < 7.0  On lantus 25u, TF coverage  DB RN following  Dysphagia Malnutrition . Secondary to medullary stroke . Did not pass swallow . On TF via Cortrak . Speech on board   Other Stroke Risk Factors  ETOH use, advised to  drink no more than 2 drink(s) a day  Morbid Obesity, Body mass index is 42.85 kg/m., recommend weight loss, diet and exercise as appropriate   Other Active Problems  AKI, resolved Cr 1.56->1.08->0.79  Hypokalemia, hypophosphatemia - replace as needed  Hospital day # 9 Continue ventilatory support for his respiratory failure due to medullary infarct.  Elective tracheostomy tomorrow.  Continue aspirin for now and add Plavix when safe after surgery.  Discussed with patient and Dr. Lamonte Sakai and answered questions. This patient is critically ill and at significant risk of neurological worsening, death and care requires constant monitoring of vital signs, hemodynamics,respiratory and cardiac monitoring, extensive review of multiple databases, frequent neurological assessment, discussion with family, other specialists and medical decision making of high complexity.I have made any additions or clarifications directly to the above note.This critical care time does not reflect procedure time, or teaching time or supervisory time of PA/NP/Med Resident etc but could involve care discussion time.  I spent 30 minutes of neurocritical care time  in the care of  this patient.      Antony Contras, MD Medical Director [place] Pager: [phone] 06/08/2019 9:32 AM   To contact Stroke Continuity provider, please refer to [email or web address] After hours, contact General Neurology

## 2019-06-08 NOTE — Progress Notes (Signed)
Wife at bedside updates given and informed of Trach procedure scheduled for 11am tomorrow 8/21.

## 2019-06-08 NOTE — Progress Notes (Signed)
[facility] Progress Note Patient Name: Michael Bright DOB: Nov 10, 1955 MRN: [id number]   Date of Service  06/08/2019  HPI/Events of Note  Pt is very agitated on the ventilator. She is on a Fentanyl infusion only.  eICU Interventions  Precedex infusion added to optimize sedation and analgesia while on the ventilator.        Kerry Kass Ogan 06/08/2019, 8:24 PM

## 2019-06-08 NOTE — Progress Notes (Signed)
NAME:  Michael KilRandolph Doering, MRN:  [id number], DOB:  [birth year], LOS: 9 ADMISSION DATE:  05/30/2019, CONSULTATION DATE:  05/31/19 REFERRING MD:  Antionette Charpyd  CHIEF COMPLAINT:  Secretions   Brief History   Michael Bright is a 63 y.o. 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  PCCM asked to evaluate early AM 8/12 for poor secretion management. His course has been complicated by ongoing dysphagia resulting in presumed aspiration requiring intubation.   Past Medical History  DM, HTN.  Significant Hospital Events   8/11 > admit. 8/12 > PCCM consult. 8/15 PCCM re consulted  8/15 ETT>>>  Consults:  Neuro. PCCM.  Procedures:  ETT 8/15>  Significant Diagnostic Tests:  CT head 8/11 > neg. MRI brain 8/11 > small left medulla infarct. Renal US 8/11 > neg. Echo 8/11 > EF 60-65%.  Micro Data:  SARS CoV2 8/11 > Neg. MRSA  05/31/2019>> Neg 8/15 Blood Cx >>NGTD 8/15 Sputum Cx rare staph aureus pan sens.    Antimicrobials:  06/03/2019 Unasyn>> 8/17 Ancef 8/17 >  Interim history/subjective:  Urinary retention overnight /p foley D/ced.  OOB yesterday with PT.  On PSV 5/5/.40 this am, doing well.  Alert, moving all EXT.   Objective:  Blood pressure (!) 153/84, pulse 95, temperature 99.9 F (37.7 C), temperature source Oral, resp. rate (!) 21, height 6' (1.829 m), weight (!) 143.3 kg, SpO2 97 %.    Vent Mode: PSV;CPAP FiO2 (%):  [40 %] 40 % Set Rate:  [18 bmp] 18 bmp Vt Set:  [620 mL] 620 mL PEEP:  [5 cmH20] 5 cmH20 Pressure Support:  [5 cmH20-8 cmH20] 5 cmH20 Plateau Pressure:  [19 cmH20-21 cmH20] 19 cmH20   Intake/Output Summary (Last 24 hours) at 06/08/2019 0817 Last data filed at 06/08/2019 0700 Gross per 24 hour  Intake 1881.57 ml  Output 1500 ml  Net 381.57 ml   Filed Weights   06/06/19 0500 06/07/19 0500 06/08/19 0600  Weight: (!) 143.9 kg (!) 144 kg (!) 143.3 kg    Examination: General: Obese, well-developed, intubated, NAD HENT: Normocephalic, PERRL. Moist mucus  membranes Neck: No JVD. Trachea midline.  CV: RRR. S1S2. No MRG. +2 distal pulses Lungs: BBS present, clear, FNL, symmetrical, PSV ABD: Rounded, +BS x4. SNT/ND. No masses, guarding or rigidity GU: Foley being placed  EXT: MAE well. No edema Skin: PWD. In tact. No rashes or lesions Neuro: Alert to verbal stimuli. Follows commands x4. Denies pain. Slight disconjugate gaze with EOMI.      Assessment & Plan:   Acute Respiratory Failure suspected 2/2 Aspiration 8/15>> TF suctioned with NTS with acute decompensation Improving fever curve -Continue ventilator support to prevent eminent deterioration and further organ dysfunction from hypoxemia and hypercarbia.   -Patient is at risk for sudden hypoxia, barotrauma and hemodynamic compromise.   -Maintain SpO2 greater than or equal to 90%. -Head of bed elevated 30 degrees. -Plateau pressures less than 30 cm H20.  -Follow chest x-ray, ABG prn -SAT/SBT as tolerated. Doing well today on PSV.  -Bronchial hygiene. -RT/bronchodilator protocol. - Awaiting ASA/plavix washout for trach. Planned for 8/21. - VAP protocol - RASS goal of 0  - Minimize sedation as able (Fenatnyl infusion)  MSSA pneumonia - Continue Ancef stop date 8/21.  - Blood cultures NGTD  HypoK Hypophos - Monitor UO - Trend renal panel - Replace lytes as needed  Nutrition - Cor track in place for TF pe recs - bowel regimen  DM -CBG monitoring and SSI Q4H -Lantus 27 units -scheduled  novolog 3U Q4H.   L medulla CVA. - Neurology is following - Plan for CIR once medically stable - Needs tracheostomy to protect airway and allow for rehab. Pending for Friday-INR in am   - Plavix on hold pending trach.  - After trach restart ASA/plavix x 3 months then ASA alone.   Hypertriglyceridemia  - Improved off propofol.  -no further monitoring  Urinary retention-pt is on flomax chronically. Unable to give per tube.  -replace foley for now.   Best Practice:  Diet: NPO, TF  per recs-off at midnight for trach 8/21 Pain/Anxiety/Delirium protocol (if indicated): per protocol VAP protocol (if indicated): in place DVT prophylaxis: SCD's / Enoxaparin. GI prophylaxis: Pepcid Glucose control: SSI. Lantus Mobility: PT/OT Code Status: Full. Family Communication: will update.  Disposition: ICU  Labs and imaging for past 72 hours reviewed in EMR.     The patient is critically ill with respiratory failure. He requires ongoing ICU for high complexity decision making, titration of high alert medications, ventilator management, titration of oxygen and interpretation of advanced monitoring.    I personally spent 35 minutes providing critical care services including personally reviewing test results, discussing care with nursing staff/other physicians and completing orders pertaining to this patient.  Time was exclusive to the patient and does not include time spent teaching or in procedures.  Voice recognition software was used in the production of this record.  Errors in interpretation may have been inadvertently missed during review.  Francine Graven, MSN, AGACNP  Pager [phone] or if no answer [phone] [facility] Pulmonary & Critical Care

## 2019-06-08 NOTE — Plan of Care (Signed)
  Problem: Education: Goal: Knowledge of disease or condition will improve Outcome: Progressing Goal: Knowledge of secondary prevention will improve Outcome: Progressing Goal: Knowledge of patient specific risk factors addressed and post discharge goals established will improve Outcome: Progressing   Problem: Coping: Goal: Will verbalize positive feelings about self Outcome: Progressing   Problem: Health Behavior/Discharge Planning: Goal: Ability to manage health-related needs will improve Outcome: Progressing   Problem: Self-Care: Goal: Ability to participate in self-care as condition permits will improve Outcome: Progressing Goal: Verbalization of feelings and concerns over difficulty with self-care will improve Outcome: Progressing Goal: Ability to communicate needs accurately will improve Outcome: Progressing   Problem: Nutrition: Goal: Risk of aspiration will decrease Outcome: Progressing Goal: Dietary intake will improve Outcome: Progressing   Problem: Ischemic Stroke/TIA Tissue Perfusion: Goal: Complications of ischemic stroke/TIA will be minimized Outcome: Progressing   Problem: Health Behavior/Discharge Planning: Goal: Ability to manage health-related needs will improve Outcome: Progressing   Problem: Clinical Measurements: Goal: Ability to maintain clinical measurements within normal limits will improve Outcome: Progressing Goal: Will remain free from infection Outcome: Progressing Goal: Diagnostic test results will improve Outcome: Progressing   Problem: Pain Managment: Goal: General experience of comfort will improve Outcome: Progressing   Problem: Safety: Goal: Ability to remain free from injury will improve Outcome: Progressing   Problem: Activity: Goal: Ability to tolerate increased activity will improve Outcome: Progressing   Problem: Respiratory: Goal: Ability to maintain a clear airway and adequate ventilation will improve Outcome:  Progressing   Problem: Role Relationship: Goal: Method of communication will improve Outcome: Progressing

## 2019-06-08 NOTE — Progress Notes (Signed)
Pharmacy Antibiotic Note  Michael Bright is a 63 y.o. male admitted on 05/30/2019 with CVA and pneumonia. Patient was initiated on Unasyn. Culture resulted for MSSA pneumonia. Pharmacy has been consulted to change to Ancef dosing.   Renal function is improving, afebrile, WBC trending down.  Plan: Ancef 2g IV Q8H through 06/10/19 Pharmacy will sign off.  Thank you for the consult!  Height: 6' (182.9 cm) Weight: (!) 315 lb 14.7 oz (143.3 kg) IBW/kg (Calculated) : 77.6  Temp (24hrs), Avg:99.3 F (37.4 C), Min:98.4 F (36.9 C), Max:99.9 F (37.7 C)  Recent Labs  Lab 06/04/19 0655 06/05/19 0927 06/06/19 0256 06/07/19 0455 06/08/19 0510  WBC 12.4* 12.9* 12.6* 11.9* 9.8  CREATININE 1.00 0.94 1.01 0.88 0.86    Estimated Creatinine Clearance: 130.9 mL/min (by C-G formula based on SCr of 0.86 mg/dL).    No Known Allergies   Unasyn 8/15 >>8/17 Cefazolin 8/17 >> (8/22)  8/15 TA - MSSA 8/15 BCx - ngtd 8/12 MRSA pcr negative 8/11 COVID negative  Lamonte Hartt D. Mina Marble, PharmD, BCPS, [place] 06/08/2019, 10:56 AM

## 2019-06-09 ENCOUNTER — Inpatient Hospital Stay (HOSPITAL_COMMUNITY): Payer: BC Managed Care – PPO

## 2019-06-09 LAB — GLUCOSE, CAPILLARY
Glucose-Capillary: 131 mg/dL — ABNORMAL HIGH (ref 70–99)
Glucose-Capillary: 139 mg/dL — ABNORMAL HIGH (ref 70–99)
Glucose-Capillary: 148 mg/dL — ABNORMAL HIGH (ref 70–99)
Glucose-Capillary: 180 mg/dL — ABNORMAL HIGH (ref 70–99)
Glucose-Capillary: 182 mg/dL — ABNORMAL HIGH (ref 70–99)
Glucose-Capillary: 220 mg/dL — ABNORMAL HIGH (ref 70–99)

## 2019-06-09 LAB — RENAL FUNCTION PANEL
Albumin: 2.3 g/dL — ABNORMAL LOW (ref 3.5–5.0)
Anion gap: 7 (ref 5–15)
BUN: 18 mg/dL (ref 8–23)
CO2: 22 mmol/L (ref 22–32)
Calcium: 8.5 mg/dL — ABNORMAL LOW (ref 8.9–10.3)
Chloride: 110 mmol/L (ref 98–111)
Creatinine, Ser: 0.83 mg/dL (ref 0.61–1.24)
GFR calc Af Amer: >60 mL/min (ref >60)
GFR calc non Af Amer: >60 mL/min (ref >60)
Glucose, Bld: 191 mg/dL — ABNORMAL HIGH (ref 70–99)
Phosphorus: 3.6 mg/dL (ref 2.5–4.6)
Potassium: 4.2 mmol/L (ref 3.5–5.1)
Sodium: 139 mmol/L (ref 135–145)

## 2019-06-09 LAB — CBC
HCT: 37.7 % — ABNORMAL LOW (ref 39.0–52.0)
Hemoglobin: 12.2 g/dL — ABNORMAL LOW (ref 13.0–17.0)
MCH: 30.3 pg (ref 26.0–34.0)
MCHC: 32.4 g/dL (ref 30.0–36.0)
MCV: 93.5 fL (ref 80.0–100.0)
Platelets: 227 10*3/uL (ref 150–400)
RBC: 4.03 MIL/uL — ABNORMAL LOW (ref 4.22–5.81)
RDW: 11.9 % (ref 11.5–15.5)
WBC: 8.8 10*3/uL (ref 4.0–10.5)
nRBC: 0 % (ref 0.0–0.2)

## 2019-06-09 LAB — PROTIME-INR
INR: 1.1 (ref 0.8–1.2)
Prothrombin Time: 14.3 seconds (ref 11.4–15.2)

## 2019-06-09 LAB — APTT: aPTT: 34 seconds (ref 24–36)

## 2019-06-09 MED ORDER — DEXMEDETOMIDINE HCL IN NACL 200 MCG/50ML IV SOLN: 0.2000 ug/kg/h | INTRAVENOUS | Status: DC

## 2019-06-09 MED ORDER — INSULIN GLARGINE 100 UNIT/ML ~~LOC~~ SOLN
30.0000 [IU] | Freq: Every day | SUBCUTANEOUS | Status: DC
  Administered 2019-06-09 – 2019-06-11 (×3): 30 [IU] via SUBCUTANEOUS
  Filled 2019-06-09 (×4): qty 0.3

## 2019-06-09 MED ORDER — PROPOFOL 500 MG/50ML IV EMUL
INTRAVENOUS | Status: AC
  Filled 2019-06-09: qty 50

## 2019-06-09 MED ORDER — DEXMEDETOMIDINE HCL IN NACL 400 MCG/100ML IV SOLN
0.2000 ug/kg/h | INTRAVENOUS | Status: DC
  Administered 2019-06-09: 0.3 ug/kg/h via INTRAVENOUS
  Administered 2019-06-09: 0.4 ug/kg/h via INTRAVENOUS
  Administered 2019-06-10: 0.5 ug/kg/h via INTRAVENOUS
  Administered 2019-06-10: 0.4 ug/kg/h via INTRAVENOUS
  Administered 2019-06-10: 1 ug/kg/h via INTRAVENOUS
  Administered 2019-06-10 (×2): 0.7 ug/kg/h via INTRAVENOUS
  Administered 2019-06-11 (×2): 0.4 ug/kg/h via INTRAVENOUS
  Administered 2019-06-12: 0.3 ug/kg/h via INTRAVENOUS
  Administered 2019-06-12: 0.4 ug/kg/h via INTRAVENOUS
  Administered 2019-06-12: 0.5 ug/kg/h via INTRAVENOUS
  Filled 2019-06-09 (×10): qty 100
  Filled 2019-06-09: qty 200
  Filled 2019-06-09 (×3): qty 100

## 2019-06-09 NOTE — Procedures (Signed)
Bronchoscopy  for Percutaneous  Tracheostomy  Name: Michael Bright MRN: [id number] DOB: 1956/09/09 Procedure: Bronchoscopy for Percutaneous Tracheostomy Indications: Diagnostic evaluation of the airways and tracheostomy In conjunction with: Dr. Tamala Julian  Procedure Details Consent: Risks of procedure as well as the alternatives and risks of each were explained to the (patient/caregiver).  Consent for procedure obtained. Time Out: Verified patient identification, verified procedure, site/side was marked, verified correct patient position, special equipment/implants available, medications/allergies/relevent history reviewed, required imaging and test results available.  Performed  In preparation for procedure, patient was given 100% FiO2 and bronchoscope lubricated. Sedation: Benzodiazepines, Muscle relaxants and Etomidate  Airway entered and the following bronchi were examined: RML.   Procedures performed: Endotracheal Tube retracted in 2 cm increments. Cannulation of airway observed. Dilation observed. Placement of trachel tube  observed . No overt complications. Bronchoscope removed.    Evaluation Hemodynamic Status: BP stable throughout; O2 sats: stable throughout Patient's Current Condition: stable Specimens:  Sent purulent fluid Complications: No apparent complications Patient did tolerate procedure well.   Richardson Landry Minor ACNP Maryanna Shape PCCM Pager [phone] till 3 pm If no answer page [phone] 06/09/2019, 11:40 AM

## 2019-06-09 NOTE — Progress Notes (Signed)
STROKE TEAM PROGRESS NOTE   INTERVAL HISTORY Pt is awake alertt and interactive. Plan for trach later this am. No neuro changes.  Vitals:   06/09/19 0600 06/09/19 0700 06/09/19 0711 06/09/19 0800  BP: 126/79 130/84 130/84 114/73  Pulse: 63 (!) 59 60 60  Resp: 12 12 12 12   Temp:      TempSrc:      SpO2: 98% 98% 99% 98%  Weight:      Height:        CBC:  Recent Labs  Lab 06/08/19 0510 06/09/19 0506  WBC 9.8 8.8  HGB 12.3* 12.2*  HCT 38.5* 37.7*  MCV 93.0 93.5  PLT 245 062    Basic Metabolic Panel:  Recent Labs  Lab 06/05/19 0927  06/07/19 0455 06/08/19 0510 06/09/19 0506  NA 138   < > 139 140 139  K 3.7   < > 3.9 3.5 4.2  CL 106   < > 108 106 110  CO2 22   < > 23 24 22   GLUCOSE 210*   < > 187* 148* 191*  BUN 19   < > 20 20 18   CREATININE 0.94   < > 0.88 0.86 0.83  CALCIUM 8.1*   < > 8.2* 8.3* 8.5*  MG 2.3  --  2.4  --   --   PHOS 4.4  --  2.2*  --  3.6   < > = values in this interval not displayed.    IMAGING past 24h No results found.  PHYSICAL EXAM      General -obese middle-aged male in no apparent distress.  Patient is intubated Ophthalmologic - fundi not visualized due to noncooperation.  Cardiovascular - Regular rate and rhythm.  Neurological Exam : Patient is intubated and sedated.  He is awake alert and interactive  .  Follows commands quite well consistently.  He has mild left eye ptosis.  Eyes are dysconjugate with the left eye hypotrophic and exotropic.  Extraocular movements appear to be full range though he has some nystagmus on right lateral gaze with saccadic dysmetria on horizontal gaze..  Face is symmetric.  Tongue midline.  Motor system exam patient is able to move all 4 extremities equally well against gravity without any focal weakness.  Deep tendon reflexes appear symmetric.  Sensation appears preserved in all 4 extremities.  Plantars are both mute.  Gait not tested.   ASSESSMENT/PLAN Michael Bright is a 63 y.o. male with hx DB who  stopped meds 10 yrs ago and morbid obesity presenting to [facility] ED with dysarthria, left facial droop, double vision and gait unsteadiness since Sunday 8/9.    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 Mild small vessel disease.     MRI  Small lateral L medullary infarct. Small vessel disease white matter and pons.   CTA head & neck no ELVO. Diffuse intracranial atherosclerosis. Mod supraclinoid B ICAs. Mild stenosis L VA origin  2D Echo EF 60-65%. No source of embolus   LDL 135  HgbA1c 10.6  Lovenox 70 mg sq daily for VTE prophylaxis  No antithrombotic prior to admission, now on aspirin 81 mg and plavix 75mg  DAPT. Given planned trach, will stop plavix for placement. Once stable, resume plavix and continue DAPT for 3 months and then ASA alone  Therapy recommendations:  CIR  Disposition:  pending   Hypoxemic Respiratory failure Hoarseness, improving  Secondary to medullary  stroke  Intubated 8/15  Sedated, added precedex to fentanyl  Plan trach on Friday for vent liberation  Possible Aspiration PNA MSSA PNA  Secondary to medullary stroke  TM 100.1  On Ancef through 8/22  Blood cx no growth   Trach cx staph aureus  Blood Pressure  No hx HTN . BP goal normotensive  Hyperlipidemia  LDL 135, goal < 70  on no statin PTA  now on lipitor 40  Continue statin at d/c  Hypertriclyceridemia   off propofol  Diabetes type II, new diagnosis  Home meds:  none  HgbA1c 10.6, goal < 7.0  On lantus 25u, TF coverage  DB RN following  Dysphagia Malnutrition . Secondary to medullary stroke . Did not pass swallow . On TF via Cortrak . Speech on board   Other Stroke Risk Factors  ETOH use, advised to drink no more than 2 drink(s) a day  Morbid Obesity, Body mass index is 42.85 kg/m., recommend weight loss, diet and exercise as appropriate   Other Active  Problems  AKI, resolved Cr 1.56->1.08->0.79->0.83  Hypokalemia, hypophosphatemia - replace as needed  Hospital day # 10 Continue plan for trach today and wean off ventilatory support later as tolerated. Continue aspirin and add plavix after PEG tube or if able to swallow.D/W patient and wife.  Greater than 50% time during this 25-minute visit was spent on counseling and coordination of care and discussion with care team and answering questions   Delia HeadyPramod , MD Medical Director [facility] Pager: [phone] 06/09/2019 8:42 AM   To contact Stroke Continuity provider, please refer to [email or web address]. After hours, contact General Neurology

## 2019-06-09 NOTE — Progress Notes (Signed)
Inpatient Rehabilitation-Admissions Coordinator   [facility] continues to follow for medical readiness and post acute rehab needs.   Will follow up Monday.   Jhonnie Garner, OTR/L  Rehab Admissions Coordinator  [phone] 06/09/2019 3:41 PM

## 2019-06-09 NOTE — Progress Notes (Signed)
  Speech Language Pathology  Patient Details Name: Michael Bright MRN: [id number] DOB: May 19, 1956 Today's Date: 06/09/2019 Time:  -     Trach placement today. New orders received for PMV and swallow. Will follow for appropriateness.       Houston Siren 06/09/2019, 4:13 PM   Orbie Pyo Colvin Caroli.Ed Risk analyst [phone] Office [phone]

## 2019-06-09 NOTE — Progress Notes (Signed)
PT Cancellation Note  Patient Details Name: Kalem Rockwell MRN: [id number] DOB: [birth year]   Cancelled Treatment:    Reason Eval/Treat Not Completed: Patient at procedure or test/unavailable (trach placement).  Ellamae Sia, PT, DPT Acute Rehabilitation Services Pager [phone] Office [phone]    Willy Eddy 06/09/2019, 11:25 AM

## 2019-06-09 NOTE — Progress Notes (Addendum)
NAME:  Agnes Probert, MRN:  [id number], DOB:  [birth year], LOS: 10 ADMISSION DATE:  05/30/2019, CONSULTATION DATE:  05/31/19 REFERRING MD:  Myna Hidalgo  CHIEF COMPLAINT:  Secretions   Brief History   Geovannie Vilar is a 63 y.o. 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  PCCM asked to evaluate early AM 8/12 for poor secretion management. His course has been complicated by ongoing dysphagia resulting in presumed aspiration requiring intubation.   Past Medical History  DM, HTN.  Significant Hospital Events   8/11 > admit. 8/12 > PCCM consult. 8/15 PCCM re consulted  8/15 ETT>>>  Consults:  Neuro. PCCM.  Procedures:  ETT 8/15>  Significant Diagnostic Tests:  CT head 8/11 > neg. MRI brain 8/11 > small left medulla infarct. Renal US 8/11 > neg. Echo 8/11 > EF 60-65%.  Micro Data:  SARS CoV2 8/11 > Neg. MRSA  05/31/2019>> Neg 8/15 Blood Cx >>NGTD 8/15 Sputum Cx rare staph aureus pan sens.    Antimicrobials:  06/03/2019 Unasyn> 8/17 Ancef 8/17 >  Interim history/subjective:  Precedex initiated overnight d/t agitation.  Appears comfortable this am. Denies pain or discomfort.  Follows commands.  Low grade temp, Tmax 37.8C.   Objective:  Blood pressure 114/73, pulse 60, temperature 98.4 F (36.9 C), temperature source Axillary, resp. rate 12, height 6' (1.829 m), weight (!) 143.3 kg, SpO2 98 %.    Vent Mode: PRVC FiO2 (%):  [40 %] 40 % Set Rate:  [12 bmp-18 bmp] 12 bmp Vt Set:  [620 mL] 620 mL PEEP:  [5 cmH20] 5 cmH20 Plateau Pressure:  [14 cmH20-23 cmH20] 23 cmH20   Intake/Output Summary (Last 24 hours) at 06/09/2019 0906 Last data filed at 06/09/2019 0800 Gross per 24 hour  Intake 1434.99 ml  Output 1235 ml  Net 199.99 ml   Filed Weights   06/07/19 0500 06/08/19 0600 06/09/19 0500  Weight: (!) 144 kg (!) 143.3 kg (!) 143.3 kg    Examination: General: Obese, well-developed, intubated, NAD HENT: Normocephalic, PERRL. Moist mucus membranes Neck: No JVD. Trachea  midline.  CV: RRR. S1S2. No MRG. +2 distal pulses Lungs: BBS present, clear, FNL, symmetrical, PSV ABD: Rounded, +BS x4. SNT/ND. No masses, guarding or rigidity GU: Foley in place EXT: MAE well. No edema Skin: PWD. In tact. No rashes or lesions Neuro: Alert to verbal stimuli. Follows commands x4. Denies pain. Slight disconjugate gaze with EOMI.      Assessment & Plan:   Acute Respiratory Failure suspected 2/2 Aspiration 8/15>> TF suctioned with NTS with acute decompensation -Continue ventilator support to prevent eminent deterioration and further organ dysfunction from hypoxemia and hypercarbia.   -Patient is at risk for sudden hypoxia, barotrauma and hemodynamic compromise.   -Maintain SpO2 greater than or equal to 90%. -Head of bed elevated 30 degrees. -Plateau pressures less than 30 cm H20.  -Follow chest x-ray, ABG prn -SAT/SBT /p trach today -Bronchial hygiene. -RT/bronchodilator protocol. -Trach today-coags WNL - VAP protocol - RASS goal of 0 once trach complete - Minimize sedation as able-doing well on fentanyl and precedex  MSSA pneumonia - Continue Ancef stop date 8/22 (coverage for SCIP/trach as well).  - Blood cultures NGTD  HypoK-resolved Hypophos-resolved  - Monitor UO - Trend renal panel - Replace lytes as needed  Nutrition - Cor track in place for TF per recs-on hold since midnight for trach - bowel regimen  DM-Hyperglycemic in past 24 hours with 40U total novolog despite TF being held -CBG monitoring and SSI Q4H -increase  Lantus to 30 units -continue scheduled novolog to 3U Q4H for now. May need to adjust further.    L medulla CVA. - Neurology is following - Plan for CIR once medically stable - Needs tracheostomy to protect airway and allow for rehab. Pending for Friday-INR in am   - Plavix on hold pending trach.  - After trach (when ok per Dr Katrinka BlazingSmith) restart ASA/plavix x 3 months then ASA alone.   Hypertriglyceridemia  - Improved off propofol.   -no further monitoring  Urinary retention-pt is on flomax chronically. Unable to give per tube.  -continue foley catheter  Best Practice:  Diet: NPO, TF per recs-off at midnight for trach 8/21 Pain/Anxiety/Delirium protocol (if indicated): per protocol VAP protocol (if indicated): in place DVT prophylaxis: SCD's / Enoxaparin. GI prophylaxis: Pepcid Glucose control: SSI. Lantus Mobility: PT/OT Code Status: Full. Family Communication: will update.  Disposition: ICU  Labs and imaging for past 24 hours reviewed in EMR.     The patient is critically ill with respiratory failure. He requires ongoing ICU for high complexity decision making, titration of high alert medications, ventilator management, titration of oxygen and interpretation of advanced monitoring.    I personally spent 35 minutes providing critical care services including personally reviewing test results, discussing care with nursing staff/other physicians and completing orders pertaining to this patient.  Time was exclusive to the patient and does not include time spent teaching or in procedures.  Voice recognition software was used in the production of this record.  Errors in interpretation may have been inadvertently missed during review.  Karin Lieuhonda Azhane Eckart, MSN, AGACNP  Pager [phone] or if no answer [phone] [facility]

## 2019-06-09 NOTE — Procedures (Signed)
Procedure: Percutaneous Tracheostomy CPT 31600 Performed by: Dr. Erskine Emery Bronchoscopy Assistant: Richardson Landry Minor under direction of Dr. Lamonte Sakai.  Indications: Chronic respiratory failure and need for ongoing mechanical ventilation.  Consent: Given patient's intubation and sedation, the patient was unable to provide consent. Discussed the procedure with the patient's decision maker, including the indications, risks, benefits, and alternatives. All questions were answered. Verbal witnessed consent was obtained and placed in the chart.  Preprocedure: Universal protocol was followed for this procedure. Prior to the initiation of sedation or the procedure, a timeout/"Pause for the Cause" was performed. The patient's identity was verified by confirming the patient's wrist band for name, date of birth, and medical record number. Everyone in the room was in agreement with the patient identify, the procedure to be performed, consent was in place and matched the planned procedure, and the procedure site. The area was cleaned with a CHG scrub and draped with large sterile barrier. Hand hygiene was performed, and cap, mask, sterile gown, and sterile gloves were worn. The patient was covered by a large sterile drape. Sterile technique was maintained for the entire procedure.  Anesthesia: The patient was intubated and sedated prior to the procedure. Additional midazolam, etomidate, fentanyl and rocuronium were given for sedation and paralysis with close attention to vital signs throughout procedure. Please refer to the accompanying procedural sedation form for additional details. Once the patient was adequately sedated and with continuous BIS monitoring, vecuronium was administered for paralysis.  Procedure: The patient was placed in the supine position. The anterior neck was prepped and draped in usual sterile fashion. 1% lidocaine was administered approximately 2 fingerbreadths above the sternal notch for local  anesthesia. A 1.5-cm vertical incision was then performed 2 fingerbreadths above the sternal notch. Using a curved Kelly, blunt dissection was performed down to the level of the pretracheal fascia. At this point, the bronchoscope was introduced through the endotracheal tube and the trachea was properly visualized. The endotracheal tube was then gradually withdrawn within the trachea under direct bronchoscopic visualization. Proper midline position was confirmed by bouncing the needle from the tracheostomy tray over the trachea with bronchoscopic examination. The needle was advanced into the trachea and proper positioning was confirmed with direct visualization. The needle was then removed leaving a white outer cannula in position. The wire from the tracheostomy tray was then advanced through the white outer cannula. The cannula was then removed. The small, blue dilator was then advanced over the wire into the trachea. Once proper dilatation was achieved, the dilator was removed. The large, tapered dilator was then advanced over the wire into the trachea. The dilator was removed leaving the wire and white inner cannula in position. A number 6-0 percutaneous Shiley tracheostomy tube was then advanced over the wire and white inner cannula into the trachea. Proper positioning was confirmed with bronchoscopic visualization. The tracheostomy tube was then sutured in place with four nylon sutures. It was further secured with a tracheostomy tie.  Estimated blood loss: Less than 5 mL.  Complications: None immediate.  CXR ordered.

## 2019-06-10 ENCOUNTER — Inpatient Hospital Stay (HOSPITAL_COMMUNITY): Payer: BC Managed Care – PPO

## 2019-06-10 LAB — CBC
HCT: 38.1 % — ABNORMAL LOW (ref 39.0–52.0)
Hemoglobin: 12.6 g/dL — ABNORMAL LOW (ref 13.0–17.0)
MCH: 29.7 pg (ref 26.0–34.0)
MCHC: 33.1 g/dL (ref 30.0–36.0)
MCV: 89.9 fL (ref 80.0–100.0)
Platelets: 250 10*3/uL (ref 150–400)
RBC: 4.24 MIL/uL (ref 4.22–5.81)
RDW: 11.8 % (ref 11.5–15.5)
WBC: 12.2 10*3/uL — ABNORMAL HIGH (ref 4.0–10.5)
nRBC: 0 % (ref 0.0–0.2)

## 2019-06-10 LAB — RENAL FUNCTION PANEL
Albumin: 2.2 g/dL — ABNORMAL LOW (ref 3.5–5.0)
Anion gap: 7 (ref 5–15)
BUN: 15 mg/dL (ref 8–23)
CO2: 24 mmol/L (ref 22–32)
Calcium: 8.4 mg/dL — ABNORMAL LOW (ref 8.9–10.3)
Chloride: 109 mmol/L (ref 98–111)
Creatinine, Ser: 0.77 mg/dL (ref 0.61–1.24)
GFR calc Af Amer: >60 mL/min (ref >60)
GFR calc non Af Amer: >60 mL/min (ref >60)
Glucose, Bld: 151 mg/dL — ABNORMAL HIGH (ref 70–99)
Phosphorus: 3.8 mg/dL (ref 2.5–4.6)
Potassium: 4 mmol/L (ref 3.5–5.1)
Sodium: 140 mmol/L (ref 135–145)

## 2019-06-10 LAB — GLUCOSE, CAPILLARY
Glucose-Capillary: 165 mg/dL — ABNORMAL HIGH (ref 70–99)
Glucose-Capillary: 202 mg/dL — ABNORMAL HIGH (ref 70–99)
Glucose-Capillary: 150 mg/dL — ABNORMAL HIGH (ref 70–99)
Glucose-Capillary: 142 mg/dL — ABNORMAL HIGH (ref 70–99)
Glucose-Capillary: 187 mg/dL — ABNORMAL HIGH (ref 70–99)
Glucose-Capillary: 236 mg/dL — ABNORMAL HIGH (ref 70–99)

## 2019-06-10 MED ORDER — HYDRALAZINE HCL 20 MG/ML IJ SOLN: 10.0000 mg | Freq: Four times a day (QID) | INTRAMUSCULAR | Status: DC | PRN

## 2019-06-10 MED ORDER — FUROSEMIDE 10 MG/ML IJ SOLN
40.0000 mg | Freq: Once | INTRAMUSCULAR | Status: AC
  Administered 2019-06-10: 40 mg via INTRAVENOUS
  Filled 2019-06-10: qty 4

## 2019-06-10 MED ORDER — FINASTERIDE 5 MG PO TABS
5.0000 mg | ORAL_TABLET | Freq: Every day | ORAL | Status: DC
  Administered 2019-06-10 – 2019-06-13 (×4): 5 mg via ORAL
  Filled 2019-06-10 (×6): qty 1

## 2019-06-10 MED ORDER — CLOPIDOGREL BISULFATE 75 MG PO TABS
75.0000 mg | ORAL_TABLET | Freq: Every day | ORAL | Status: DC
  Administered 2019-06-10 – 2019-06-27 (×18): 75 mg
  Filled 2019-06-10 (×18): qty 1

## 2019-06-10 NOTE — Progress Notes (Addendum)
NAME:  Michael Bright, MRN:  [id number], DOB:  26-Apr-1956, LOS: 27 ADMISSION DATE:  05/30/2019, CONSULTATION DATE:  05/31/19 REFERRING MD:  Myna Hidalgo  CHIEF COMPLAINT:  Secretions   Brief History   Michael Bright is a 63 y.o. 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  PCCM asked to evaluate early AM 8/12 for poor secretion management. His course has been complicated by ongoing dysphagia resulting in presumed aspiration requiring intubation.   Past Medical History  DM, HTN.  Significant Hospital Events   8/11 > admit. 8/12 > PCCM consult. 8/15 PCCM re consulted  8/15 ETT>>> 8/21 8/21 trach  Consults:  Neuro. PCCM.  Procedures:  ETT 8/15> 8/21 Trach (DS) 8/21 >>   Significant Diagnostic Tests:  CT head 8/11 > neg. MRI brain 8/11 > small left medulla infarct. Renal US 8/11 > neg. Echo 8/11 > EF 60-65%.  Micro Data:  SARS CoV2 8/11 > Neg. MRSA  05/31/2019>> Neg 8/15 Blood Cx >>NGTD 8/15 Sputum Cx rare staph aureus pan sens.    Antimicrobials:  06/03/2019 Unasyn> 8/17 Ancef 8/17 >  Interim history/subjective:  Tracheostomy on 8/21 without any apparent complication On fentanyl 25, Precedex 0.6, denies any pain or discomfort Starting on pressure support wean now    Objective:  Blood pressure (!) 159/77, pulse 91, temperature 99.3 F (37.4 C), temperature source Axillary, resp. rate (!) 31, height 6' (1.829 m), weight (!) 144.7 kg, SpO2 95 %.    Vent Mode: PSV;CPAP FiO2 (%):  [40 %] 40 % Set Rate:  [12 bmp] 12 bmp Vt Set:  [620 mL] 620 mL PEEP:  [5 cmH20] 5 cmH20 Pressure Support:  [5 cmH20-14 cmH20] 14 cmH20 Plateau Pressure:  [20 cmH20-27 cmH20] 20 cmH20   Intake/Output Summary (Last 24 hours) at 06/10/2019 0804 Last data filed at 06/10/2019 0600 Gross per 24 hour  Intake 1742.91 ml  Output 1360 ml  Net 382.91 ml   Filed Weights   06/08/19 0600 06/09/19 0500 06/10/19 0448  Weight: (!) 143.3 kg (!) 143.3 kg (!) 144.7 kg    Examination: General: Obese man,  well-developed, no distress HENT: Oropharynx moist, no lesions Neck: Trach in place, midline, stoma clean CV: Regular, no murmur Lungs: Distant but clear, no wheezes, no crackles ABD: Obese, somewhat distended but nontender, positive bowel sounds GU: Foley catheter in place EXT: No edema Skin: No rash Neuro: Awake, nods to questions, good cough on command.  Appears to be managing his oral secretions     Assessment & Plan:   Acute Respiratory Failure suspected 2/2 Aspiration 8/15>> TF suctioned with NTS with acute decompensation -Continue bronchial hygiene -Trach care per protocol -lasix x 1 on 8/22 -Plan to try to move to spontaneous breathing 8/22, hopefully to trach collar quickly.  -VAP prevention orders per protocol -Plan to wean sedation off quickly  MSSA pneumonia -Cefazolin through 8/22 -Continue to follow culture data  HypoK-resolved Hypophos-resolved  -Follow urine output, BMP -Replace electrolytes as indicated  Nutrition -Continue tube feeding until we believe he can tolerate PMV -Bowel regimen as ordered  DM-Hyperglycemic in past 24 hours with 40U total novolog despite TF being held -CBG and SSI as per protocol -Lantus 30 units daily -Scheduled NovoLog for tube feed coverage   L medulla CVA. -Appreciate neurology assistance -Should be able to restart Plavix on 8/22, plan for ASA/Plavix for 3 months, then aspirin alone  Hypertriglyceridemia  -Resolved  Urinary retention-pt is on flomax chronically. Unable to give per tube.  -Try to substitute  Proscar per tube -Trial discontinuation Foley to see if he retains  Best Practice:  Diet: Restart tube feeding Pain/Anxiety/Delirium protocol (if indicated): per protocol VAP protocol (if indicated): in place DVT prophylaxis: SCD's / Enoxaparin. GI prophylaxis: Pepcid Glucose control: SSI. Lantus Mobility: PT/OT Code Status: Full. Family Communication: will update.  Disposition: ICU  Labs and imaging  for past 24 hours reviewed in EMR.   Independent critical care time 33 minutes  Levy Pupaobert Kahmari Koller, MD, PhD 06/10/2019, 8:16 AM [facility] [phone] or if no answer [phone]

## 2019-06-10 NOTE — Progress Notes (Signed)
STROKE TEAM PROGRESS NOTE   INTERVAL HISTORY Pt is awake alertt and interactive. Had trach yesterday, this am off vent, put on trach collar. Initially he was agitated with trach collar, but not much improved. He was put on precedex and fentanyl drip due to agitation but not off fentanyl and anticipate off precedex over the day.   Vitals:   06/10/19 0600 06/10/19 0720 06/10/19 0755 06/10/19 0800  BP: 140/75 126/68 126/68 (!) 159/77  Pulse: 71 69 91   Resp: 17  (!) 31   Temp:      TempSrc:      SpO2: 98% 98% 95%   Weight:      Height:        CBC:  Recent Labs  Lab 06/08/19 0510 06/09/19 0506  WBC 9.8 8.8  HGB 12.3* 12.2*  HCT 38.5* 37.7*  MCV 93.0 93.5  PLT 245 161    Basic Metabolic Panel:  Recent Labs  Lab 06/05/19 0927  06/07/19 0455 06/08/19 0510 06/09/19 0506  NA 138   < > 139 140 139  K 3.7   < > 3.9 3.5 4.2  CL 106   < > 108 106 110  CO2 22   < > 23 24 22   GLUCOSE 210*   < > 187* 148* 191*  BUN 19   < > 20 20 18   CREATININE 0.94   < > 0.88 0.86 0.83  CALCIUM 8.1*   < > 8.2* 8.3* 8.5*  MG 2.3  --  2.4  --   --   PHOS 4.4  --  2.2*  --  3.6   < > = values in this interval not displayed.    IMAGING past 24h Dg Chest Port 1 View  Result Date: 06/09/2019 CLINICAL DATA:  Post tracheostomy EXAM: PORTABLE CHEST 1 VIEW COMPARISON:  Portable exam 0960 hours compared to 06/04/2019 FINDINGS: Tracheostomy tube present projecting over tracheal air column. Borderline enlargement of cardiac silhouette. Mediastinal contour stable. Bibasilar atelectasis. Upper lungs clear. No pleural effusion or pneumothorax. IMPRESSION: New tracheostomy tube. Bibasilar atelectasis. Electronically Signed   By: Lavonia Dana M.D.   On: 06/09/2019 12:59    PHYSICAL EXAM      General -obese middle-aged male in no apparent distress.  Patient on trach collar  Ophthalmologic - fundi not visualized due to noncooperation.  Cardiovascular - Regular rate and rhythm.  Neuro - Patient is on trach  collar, no respiratory distress.  He is awake alert and interactive.  Follows commands quite well consistently.  He has mild left eye ptosis.  Eyes are dysconjugate with the left eye hypotrophic and exotropic.  Extraocular movements appear to be full range though he has some nystagmus on right lateral gaze with saccadic dysmetria on horizontal gaze.  Face is symmetric. Tongue midline.  Motor system exam patient is able to move all 4 extremities equally well against gravity without any focal weakness.  Deep tendon reflexes appear symmetric.  Sensation appears preserved in all 4 extremities.  Plantars are both mute.  Gait not tested. FTN slow but intact bilaterally.   ASSESSMENT/PLAN [name] is a 63 y.o. male with hx DB who stopped meds 10 yrs ago and morbid obesity presenting to [facility] ED with dysarthria, left facial droop, double vision and gait unsteadiness since Sunday 8/9.   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 Mild small vessel disease.  MRI  Small lateral L medullary infarct. Small vessel disease white matter and pons.   CTA head & neck no ELVO. Diffuse intracranial atherosclerosis. Mod supraclinoid B ICAs. Mild stenosis L VA origin  2D Echo EF 60-65%. No source of embolus   LDL 135  HgbA1c 10.6  Lovenox 70 mg sq daily for VTE prophylaxis  No antithrombotic prior to admission, now on aspirin 81 mg and plavix 75mg  DAPT. Continue DAPT for 3 months and then ASA alone  Therapy recommendations:  CIR  Disposition:  pending   Hypoxemic Respiratory failure Hoarseness, improving  Secondary to medullary stroke  Intubated 8/15  Trach 8/21  On trach collar 8/22  Still on precedex but off fentanyl  CCM on board  Possible Aspiration PNA MSSA PNA  Secondary to medullary stroke  TM 100.1-> 99.3 ax  On Ancef - last dose today   Blood cx no growth   Trach cx staph  aureus  Blood Pressure  No hx HTN  Stable  Hydralazine PRN for BP > 180/105 . Long term BP goal normotensive  Hyperlipidemia  LDL 135, goal < 70  on no statin PTA  Now on lipitor 40  Continue statin at d/c  Hypertriclyceridemia   Off propofol  Diabetes type II, new diagnosis  Home meds:  none  HgbA1c 10.6, goal < 7.0  On lantus 25u, TF coverage  DB RN following  Dysphagia Malnutrition . Secondary to medullary stroke . Did not pass swallow . On TF via Cortrak . Speech on board   Other Stroke Risk Factors  ETOH use, advised to drink no more than 2 drink(s) a day  Morbid Obesity, Body mass index is 43.26 kg/m., recommend weight loss, diet and exercise as appropriate   Other Active Problems  AKI, resolved Cr 1.56->1.08->0.79->0.83   Hospital day # 11   This patient is critically ill and at significant risk of neurological worsening, death and care requires constant monitoring of vital signs, hemodynamics,respiratory and cardiac monitoring, extensive review of multiple databases, frequent neurological assessment, discussion with family, other specialists and medical decision making of high complexity.I have made any additions or clarifications directly to the above note. I spent 35 minutes of neurocritical care time  in the care of  this patient  Marvel PlanJindong Catharina Pica, MD PhD Stroke Neurology 06/10/2019 10:03 AM   To contact Stroke Continuity provider, please refer to [email or web address]. After hours, contact General Neurology

## 2019-06-10 NOTE — Progress Notes (Signed)
Foley catheter discontinued per protocol. Proscar initiated today.

## 2019-06-11 LAB — CBC
HCT: 39.5 % (ref 39.0–52.0)
Hemoglobin: 12.9 g/dL — ABNORMAL LOW (ref 13.0–17.0)
MCH: 29.9 pg (ref 26.0–34.0)
MCHC: 32.7 g/dL (ref 30.0–36.0)
MCV: 91.6 fL (ref 80.0–100.0)
Platelets: 287 10*3/uL (ref 150–400)
RBC: 4.31 MIL/uL (ref 4.22–5.81)
RDW: 11.6 % (ref 11.5–15.5)
WBC: 12.6 10*3/uL — ABNORMAL HIGH (ref 4.0–10.5)
nRBC: 0 % (ref 0.0–0.2)

## 2019-06-11 LAB — CULTURE, BAL-QUANTITATIVE W GRAM STAIN: Culture: 30000 — AB

## 2019-06-11 LAB — RENAL FUNCTION PANEL
Albumin: 2.2 g/dL — ABNORMAL LOW (ref 3.5–5.0)
Anion gap: 8 (ref 5–15)
BUN: 21 mg/dL (ref 8–23)
CO2: 24 mmol/L (ref 22–32)
Calcium: 8.4 mg/dL — ABNORMAL LOW (ref 8.9–10.3)
Chloride: 107 mmol/L (ref 98–111)
Creatinine, Ser: 0.81 mg/dL (ref 0.61–1.24)
GFR calc Af Amer: >60 mL/min (ref >60)
GFR calc non Af Amer: >60 mL/min (ref >60)
Glucose, Bld: 199 mg/dL — ABNORMAL HIGH (ref 70–99)
Phosphorus: 3.1 mg/dL (ref 2.5–4.6)
Potassium: 3.8 mmol/L (ref 3.5–5.1)
Sodium: 139 mmol/L (ref 135–145)

## 2019-06-11 LAB — GLUCOSE, CAPILLARY
Glucose-Capillary: 178 mg/dL — ABNORMAL HIGH (ref 70–99)
Glucose-Capillary: 154 mg/dL — ABNORMAL HIGH (ref 70–99)
Glucose-Capillary: 187 mg/dL — ABNORMAL HIGH (ref 70–99)
Glucose-Capillary: 159 mg/dL — ABNORMAL HIGH (ref 70–99)
Glucose-Capillary: 173 mg/dL — ABNORMAL HIGH (ref 70–99)

## 2019-06-11 MED ORDER — QUETIAPINE FUMARATE 100 MG PO TABS: 100.0000 mg | ORAL_TABLET | Freq: Once | ORAL | Status: DC

## 2019-06-11 MED ORDER — QUETIAPINE FUMARATE 100 MG PO TABS: 100.0000 mg | ORAL_TABLET | Freq: Every day | ORAL | Status: DC

## 2019-06-11 MED ORDER — CHLORHEXIDINE GLUCONATE CLOTH 2 % EX PADS
6.0000 | MEDICATED_PAD | Freq: Every day | CUTANEOUS | Status: DC
  Administered 2019-06-12 – 2019-06-27 (×16): 6 via TOPICAL

## 2019-06-11 MED ORDER — QUETIAPINE FUMARATE 100 MG PO TABS
100.0000 mg | ORAL_TABLET | ORAL | Status: AC
  Administered 2019-06-11: 100 mg via ORAL
  Filled 2019-06-11: qty 1

## 2019-06-11 NOTE — Progress Notes (Signed)
[place] Progress Note Patient Name: Michael Bright DOB: [birth year] MRN: [id number]   Date of Service  06/11/2019  HPI/Events of Note  Delirium - Request for soft waist belt.   eICU Interventions  Will order: 1. Soft waist belt.     Intervention Category Major Interventions: Delirium, psychosis, severe agitation - evaluation and management  Sommer,Steven Eugene 06/11/2019, 9:25 PM

## 2019-06-11 NOTE — Progress Notes (Signed)
Patient's urethral foley catheter reinserted due to urinary retention. Proscar already initiated.

## 2019-06-11 NOTE — Progress Notes (Addendum)
NAME:  Michael Bright, MRN:  [id number], DOB:  1955-12-04, LOS: 12 ADMISSION DATE:  05/30/2019, CONSULTATION DATE:  05/31/19 REFERRING MD:  Antionette Charpyd  CHIEF COMPLAINT:  Secretions   Brief History   Michael KilRandolph South is a 63 y.o. 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  PCCM asked to evaluate early AM 8/12 for poor secretion management. His course has been complicated by ongoing dysphagia resulting in presumed aspiration requiring intubation.   Past Medical History  DM, HTN.  Significant Hospital Events   8/11 > admit. 8/12 > PCCM consult. 8/15 PCCM re consulted  8/15 ETT>>> 8/21 8/21 trach  Consults:  Neuro. PCCM.  Procedures:  ETT 8/15> 8/21 Trach (DS) 8/21 >>   Significant Diagnostic Tests:  CT head 8/11 > neg. MRI brain 8/11 > small left medulla infarct. Renal US 8/11 > neg. Echo 8/11 > EF 60-65%.  Micro Data:  SARS CoV2 8/11 > Neg. MRSA  05/31/2019>> Neg 8/15 Blood Cx >>NGTD 8/15 Sputum Cx rare staph aureus pan sens.    Antimicrobials:  06/03/2019 Unasyn> 8/17 Ancef 8/17 >  Interim history/subjective:  Tolerated ATC overnight Precedex 0.4 Good cough    Objective:  Blood pressure (!) 142/76, pulse 75, temperature 98.8 F (37.1 C), temperature source Oral, resp. rate (!) 28, height 6' (1.829 m), weight (!) 142.5 kg, SpO2 98 %.    FiO2 (%):  [35 %-60 %] 35 %   Intake/Output Summary (Last 24 hours) at 06/11/2019 0850 Last data filed at 06/11/2019 0800 Gross per 24 hour  Intake 1696.61 ml  Output 2800 ml  Net -1103.39 ml   Filed Weights   06/09/19 0500 06/10/19 0448 06/11/19 0500  Weight: (!) 143.3 kg (!) 144.7 kg (!) 142.5 kg    Examination: General: Obese man, well-developed, no distress HENT: Oropharynx moist, no lesions Neck: Trach in place, midline, stoma clean CV: Regular, no murmur Lungs: Distant but clear, no wheezes, no crackles ABD: Obese, somewhat distended but nontender, positive bowel sounds GU: Foley catheter in place EXT: No edema  Skin: No rash Neuro: Awake, nods to questions, good cough on command.  Appears to be managing his oral secretions     Assessment & Plan:   Acute Respiratory Failure suspected 2/2 Aspiration 8/15>> TF suctioned with NTS with acute decompensation -Continue bronchial hygiene -Trach care per protocol -Continue ATC as tolerated, hopefully get the vent out of the room on 8/24 -VAP prevention orders -We will work to get Precedex weaned to off -He will need rehab -Would need to go back to CPAP for his obstructive sleep apnea if/when he is decannulated in the future  MSSA pneumonia -Received cefazolin through 8/22 -Follow clinically, culture data  HypoK-resolved Hypophos-resolved  -Continue to follow urine output, BMP -Replace electrolytes as indicated  Nutrition -SLP assessment for PMV and possible diet -Bowel regimen as ordered  DM-Hyperglycemic in past 24 hours with 40U total novolog despite TF being held -SSI as per protocol -Lantus 30 units daily -Scheduled NovoLog per tube feed coverage  L medulla CVA. -Appreciate neurology assistance -Plavix and aspirin for 3 months, then transition to aspirin alone  Agitated delirium, encephalopathy.  Suspect some is residual from his CVA, also some degree of residual medications, ICU delirium -Work to wean Precedex to off  Urinary retention-pt is on flomax chronically. Unable to give per tube.  -Foley discontinued 8/22, -Substituted Proscar for Flomax, continue  Best Practice:  Diet: Restart tube feeding Pain/Anxiety/Delirium protocol (if indicated): per protocol VAP protocol (if indicated): in  place DVT prophylaxis: SCD's / Enoxaparin. GI prophylaxis: Pepcid Glucose control: SSI. Lantus Mobility: PT/OT Code Status: Full. Family Communication: will update.  Disposition: ICU, can transition out of the ICU once his Precedex is discontinued  Labs and imaging for past 24 hours reviewed in EMR.   Independent critical care time  32 minutes  Baltazar Apo, MD, PhD 06/11/2019, 8:50 AM [facility] [phone] or if no answer [phone]

## 2019-06-11 NOTE — Progress Notes (Addendum)
Dr. Lamonte Sakai paged and made aware of patient's worsening paranoia. Patient able to tell me he is at [facility] for a CVA, however, believes he sees things in the sky "trying to get him". Patient continues to try to get out of bed and has an overall appearance of dread on his face.   Blood pressure (!) 150/82, pulse 121, temperature 100.2 F (37.9 C), temperature source Oral, resp. rate (!) 24, SpO2 100 %.  Seroquel ordered and given. Will continue to monitor.

## 2019-06-11 NOTE — Progress Notes (Signed)
STROKE TEAM PROGRESS NOTE   INTERVAL HISTORY Pt is awake alert, still on trach collar tolerating well. Had some agitation and hallucination overnight as per nurse. This morning, he is orientated and following all commands.  Off precedex.   Vitals:   06/11/19 0414 06/11/19 0500 06/11/19 0600 06/11/19 0700  BP:  132/70 (!) 153/78 140/75  Pulse:  82 81 82  Resp: (!) 21 (!) 31 (!) 26 (!) 37  Temp:      TempSrc:      SpO2:  98% 98% 98%  Weight:  (!) 142.5 kg    Height:        CBC:  Recent Labs  Lab 06/10/19 1625 06/11/19 0514  WBC 12.2* 12.6*  HGB 12.6* 12.9*  HCT 38.1* 39.5  MCV 89.9 91.6  PLT 250 106    Basic Metabolic Panel:  Recent Labs  Lab 06/05/19 0927  06/07/19 0455  06/10/19 1625 06/11/19 0514  NA 138   < > 139   < > 140 139  K 3.7   < > 3.9   < > 4.0 3.8  CL 106   < > 108   < > 109 107  CO2 22   < > 23   < > 24 24  GLUCOSE 210*   < > 187*   < > 151* 199*  BUN 19   < > 20   < > 15 21  CREATININE 0.94   < > 0.88   < > 0.77 0.81  CALCIUM 8.1*   < > 8.2*   < > 8.4* 8.4*  MG 2.3  --  2.4  --   --   --   PHOS 4.4  --  2.2*   < > 3.8 3.1   < > = values in this interval not displayed.    IMAGING past 24h Dg Chest Port 1 View  Result Date: 06/10/2019 CLINICAL DATA:  Weakness. EXAM: PORTABLE CHEST 1 VIEW COMPARISON:  Chest radiograph 06/09/2019 FINDINGS: Tracheostomy tube mid trachea. Enteric tube courses inferior to the diaphragm. Monitoring leads overlie the patient. Stable cardiomegaly. Similar-appearing bilateral mid and lower lung patchy areas of consolidation. No pleural effusion or pneumothorax. IMPRESSION: Similar bibasilar opacities favored to represent atelectasis. Electronically Signed   By: Lovey Newcomer M.D.   On: 06/10/2019 09:51   Dg Chest Port 1 View  Result Date: 06/09/2019 CLINICAL DATA:  Post tracheostomy EXAM: PORTABLE CHEST 1 VIEW COMPARISON:  Portable exam 1232 hours compared to 06/04/2019 FINDINGS: Tracheostomy tube present projecting over  tracheal air column. Borderline enlargement of cardiac silhouette. Mediastinal contour stable. Bibasilar atelectasis. Upper lungs clear. No pleural effusion or pneumothorax. IMPRESSION: New tracheostomy tube. Bibasilar atelectasis. Electronically Signed   By: Lavonia Dana M.D.   On: 06/09/2019 12:59    PHYSICAL EXAM      General -obese middle-aged male in no apparent distress.  Patient on trach collar  Ophthalmologic - fundi not visualized due to noncooperation.  Cardiovascular - Regular rate and rhythm.  Neuro - Patient is on trach collar, no respiratory distress.  He is awake alert.  Follows all simple commands.  He has mild left eye ptosis.  Eyes are dysconjugate with the left eye hypotrophic and exotropic.  Extraocular movements appear to be full range though he has some nystagmus on right lateral gaze with saccadic dysmetria on horizontal gaze.  Face is symmetric. Tongue midline.  Motor system exam patient is able to move all 4 extremities equally against gravity without focal  weakness.  Deep tendon reflexes appear symmetric.  Sensation appears preserved in all 4 extremities.  Plantars are both mute.  Gait not tested. FTN slow but intact bilaterally.   ASSESSMENT/PLAN Michael Bright is a 63 y.o. male with hx DB who stopped meds 10 yrs ago and morbid obesity [facility] with dysarthria, left facial droop, double vision and gait unsteadiness since Sunday 8/9.   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 Mild small vessel disease.     MRI  Small lateral L medullary infarct. Small vessel disease white matter and pons.   CTA head & neck no ELVO. Diffuse intracranial atherosclerosis. Mod supraclinoid B ICAs. Mild stenosis L VA origin  2D Echo EF 60-65%. No source of embolus   LDL 135  HgbA1c 10.6  Lovenox 70 mg sq daily for VTE prophylaxis  No antithrombotic prior to admission,  now on aspirin 81 mg and plavix 75mg  DAPT. Continue DAPT for 3 months and then ASA alone  Therapy recommendations:  CIR  Disposition:  pending   Hypoxemic Respiratory failure Hoarseness, improving  Secondary to medullary stroke  Intubated 8/15  Trach 8/21  On trach collar 8/22  Still on precedex but off fentanyl  CCM on board  Possible Aspiration PNA MSSA PNA  Secondary to medullary stroke  TM 100.8-> 100.1  Completed course of Ancef   Blood cx no growth   Trach cx staph aureus  Blood Pressure  No hx HTN  Stable  Hydralazine PRN for BP > 180/105 . Long term BP goal normotensive  Hyperlipidemia  LDL 135, goal < 70  on no statin PTA  Now on lipitor 40  Continue statin at d/c  Hypertriclyceridemia   Off propofol  TG 165->243->184  Diabetes type II, new diagnosis  Home meds:  none  HgbA1c 10.6, goal < 7.0  On lantus 25u, TF coverage  DB RN following  Dysphagia Malnutrition . Secondary to medullary stroke . Did not pass swallow . On TF via Cortrak . Speech on board . Consider speaking valve    Other Stroke Risk Factors  ETOH use, advised to drink no more than 2 drink(s) a day  Morbid Obesity, Body mass index is 42.61 kg/m., recommend weight loss, diet and exercise as appropriate   Other Active Problems  AKI, resolved Cr 1.56->1.08->0.79->0.83->0.81   Hospital day # 12   This patient is critically ill and at significant risk of neurological worsening, death and care requires constant monitoring of vital signs, hemodynamics,respiratory and cardiac monitoring, extensive review of multiple databases, frequent neurological assessment, discussion with family, other specialists and medical decision making of high complexity.I have made any additions or clarifications directly to the above note. I spent 30 minutes of neurocritical care time  in the care of  this patient.  Neurology will sign off. Please call with questions. Pt will  [facility] in about 4 weeks. Thanks for the consult.  Marvel PlanJindong Mekiah Cambridge, MD PhD Stroke Neurology 06/11/2019 9:54 AM   To contact Stroke Continuity provider, please refer to [email or web address]. After hours, contact General Neurology

## 2019-06-12 ENCOUNTER — Inpatient Hospital Stay (HOSPITAL_COMMUNITY): Payer: BC Managed Care – PPO

## 2019-06-12 DIAGNOSIS — E1365 Other specified diabetes mellitus with hyperglycemia: Secondary | ICD-10-CM

## 2019-06-12 DIAGNOSIS — N183 Chronic kidney disease, stage 3 (moderate): Secondary | ICD-10-CM

## 2019-06-12 DIAGNOSIS — R1312 Dysphagia, oropharyngeal phase: Secondary | ICD-10-CM

## 2019-06-12 DIAGNOSIS — E1322 Other specified diabetes mellitus with diabetic chronic kidney disease: Secondary | ICD-10-CM

## 2019-06-12 LAB — POCT I-STAT 7, (LYTES, BLD GAS, ICA,H+H)
Acid-Base Excess: 1 mmol/L (ref 0.0–2.0)
Bicarbonate: 26.2 mmol/L (ref 20.0–28.0)
Calcium, Ion: 1.21 mmol/L (ref 1.15–1.40)
HCT: 37 % — ABNORMAL LOW (ref 39.0–52.0)
Hemoglobin: 12.6 g/dL — ABNORMAL LOW (ref 13.0–17.0)
O2 Saturation: 59 %
Patient temperature: 100.1
Potassium: 3.8 mmol/L (ref 3.5–5.1)
Sodium: 143 mmol/L (ref 135–145)
TCO2: 27 mmol/L (ref 22–32)
pCO2 arterial: 42.3 mmHg (ref 32.0–48.0)
pH, Arterial: 7.403 (ref 7.350–7.450)
pO2, Arterial: 32 mmHg — CL (ref 83.0–108.0)

## 2019-06-12 LAB — CBC
HCT: 36.9 % — ABNORMAL LOW (ref 39.0–52.0)
Hemoglobin: 11.9 g/dL — ABNORMAL LOW (ref 13.0–17.0)
MCH: 29.8 pg (ref 26.0–34.0)
MCHC: 32.2 g/dL (ref 30.0–36.0)
MCV: 92.3 fL (ref 80.0–100.0)
Platelets: 314 10*3/uL (ref 150–400)
RBC: 4 MIL/uL — ABNORMAL LOW (ref 4.22–5.81)
RDW: 11.8 % (ref 11.5–15.5)
WBC: 14.5 10*3/uL — ABNORMAL HIGH (ref 4.0–10.5)
nRBC: 0 % (ref 0.0–0.2)

## 2019-06-12 LAB — BASIC METABOLIC PANEL
Anion gap: 8 (ref 5–15)
BUN: 18 mg/dL (ref 8–23)
CO2: 26 mmol/L (ref 22–32)
Calcium: 8.4 mg/dL — ABNORMAL LOW (ref 8.9–10.3)
Chloride: 107 mmol/L (ref 98–111)
Creatinine, Ser: 0.83 mg/dL (ref 0.61–1.24)
GFR calc Af Amer: >60 mL/min (ref >60)
GFR calc non Af Amer: >60 mL/min (ref >60)
Glucose, Bld: 174 mg/dL — ABNORMAL HIGH (ref 70–99)
Potassium: 4 mmol/L (ref 3.5–5.1)
Sodium: 141 mmol/L (ref 135–145)

## 2019-06-12 LAB — GLUCOSE, CAPILLARY
Glucose-Capillary: 111 mg/dL — ABNORMAL HIGH (ref 70–99)
Glucose-Capillary: 111 mg/dL — ABNORMAL HIGH (ref 70–99)
Glucose-Capillary: 134 mg/dL — ABNORMAL HIGH (ref 70–99)
Glucose-Capillary: 141 mg/dL — ABNORMAL HIGH (ref 70–99)
Glucose-Capillary: 145 mg/dL — ABNORMAL HIGH (ref 70–99)
Glucose-Capillary: 167 mg/dL — ABNORMAL HIGH (ref 70–99)

## 2019-06-12 LAB — MAGNESIUM: Magnesium: 2.3 mg/dL (ref 1.7–2.4)

## 2019-06-12 MED ORDER — INSULIN GLARGINE 100 UNIT/ML ~~LOC~~ SOLN
20.0000 [IU] | Freq: Every day | SUBCUTANEOUS | Status: DC
  Administered 2019-06-12 – 2019-06-22 (×11): 20 [IU] via SUBCUTANEOUS
  Filled 2019-06-12 (×13): qty 0.2

## 2019-06-12 MED ORDER — ONDANSETRON HCL 4 MG/2ML IJ SOLN
4.0000 mg | Freq: Four times a day (QID) | INTRAMUSCULAR | Status: DC | PRN
  Administered 2019-06-12 – 2019-06-14 (×2): 4 mg via INTRAVENOUS
  Filled 2019-06-12 (×2): qty 2

## 2019-06-12 MED ORDER — QUETIAPINE FUMARATE 50 MG PO TABS
100.0000 mg | ORAL_TABLET | Freq: Every day | ORAL | Status: DC
  Administered 2019-06-12 – 2019-06-26 (×15): 100 mg
  Filled 2019-06-12 (×9): qty 2
  Filled 2019-06-12: qty 1
  Filled 2019-06-12 (×5): qty 2

## 2019-06-12 MED ORDER — QUETIAPINE FUMARATE 25 MG PO TABS
50.0000 mg | ORAL_TABLET | Freq: Every day | ORAL | Status: DC
  Administered 2019-06-12: 50 mg via ORAL
  Filled 2019-06-12: qty 2

## 2019-06-12 MED ORDER — QUETIAPINE FUMARATE 50 MG PO TABS
50.0000 mg | ORAL_TABLET | Freq: Every day | ORAL | Status: DC
  Administered 2019-06-13 – 2019-06-27 (×15): 50 mg
  Filled 2019-06-12 (×7): qty 1
  Filled 2019-06-12: qty 2
  Filled 2019-06-12 (×7): qty 1

## 2019-06-12 NOTE — Progress Notes (Signed)
Obtained ABG and turned out to be venous blood instead of arterial. Notified RN. Results: 7.41/40.06/18/25.2 pt sats 100% on 28% FIO2 ATC. Will cont to monitor

## 2019-06-12 NOTE — Progress Notes (Signed)
Occupational Therapy Treatment Patient Details Name: Michael Bright MRN: [id number] DOB: December 15, 1955 Today's Date: 06/12/2019    History of present illness Pt adm with lt sided weakness and numbness. Pt with lt medullary infarct. Intubated 8/15 due to suspected aspiration. trach 8/21. PMH -  DM, HTN   OT comments  This 63 yo male admitted with above presents to acute OT with needing increased A for bed mobility today (coming out on right side v. Left), but decreased A for sit<>stand today with use of Denna Haggard. Increased time to wash face due to decreased coordination and strength in Bil UEs. He will continue to benefit from acute OT with follow up OT on CIR.  Follow Up Recommendations  CIR;Supervision/Assistance - 24 hour    Equipment Recommendations  Other (comment)(TBD next venue)       Precautions / Restrictions Precautions Precautions: Fall;Other (comment) Precaution Comments: cortrak, BP did drop in sitting from supine (see PT note) Restrictions Weight Bearing Restrictions: No       Mobility Bed Mobility Overal bed mobility: Needs Assistance Bed Mobility: Supine to Sit     Supine to sit: Max assist;+2 for physical assistance;HOB elevated(coming out on right hand side of bed, had been coming out on left hand side of bed due to vent (but now off vent))     General bed mobility comments: Pt needed A to bring legs off of side of bed to left (then he automatically put them back up on bed). When asked to sit up he started up into long sitting and then needed A to bring legs off again to right. Per PT who has seen him before they had been coming up on left hand side due to vent (now off--trached)--right hand side was harder for pt. Needed A for trunk. He was reaching for end of bed with left UE (but not looking that way) but was not able to achieve this on his own  Transfers Overall transfer level: Needs assistance   Transfers: Sit to/from Stand Sit to Stand: Min assist;+2  physical assistance              Balance Overall balance assessment: Needs assistance Sitting-balance support: Feet supported;Bilateral upper extremity supported   Sitting balance - Comments: Varied intially from zero with heavy lean to right with progression to fair once postioned squarely at EOB Postural control: Posterior lean Standing balance support: Bilateral upper extremity supported Standing balance-Leahy Scale: Poor Standing balance comment: External support for standing balance with use of sara stedy, tendency for right lateral lean with cues to correct                           ADL either performed or assessed with clinical judgement   ADL Overall ADL's : Needs assistance/impaired Eating/Feeding: NPO Eating/Feeding Details (indicate cue type and reason): coretrak Grooming: Set up Grooming Details (indicate cue type and reason): sitting in recliner, decreased coordination with hands/arms but able to eventually wash his face, head, and ears with increased time                     Toileting- Clothing Manipulation and Hygiene: Minimal assistance;+2 for physical assistance Toileting - Clothing Manipulation Details (indicate cue type and reason): sit<>stand and with use of Denna Haggard             Vision Baseline Vision/History: Wears glasses Wears Glasses: Distance only Additional Comments: Pt with left inattention  Cognition Arousal/Alertness: Awake/alert Behavior During Therapy: Flat affect Overall Cognitive Status: Difficult to assess                                 General Comments: Mildly impulsive, needing safety cues. Following simple commands                   Pertinent Vitals/ Pain       Pain Assessment: No/denies pain         Frequency  Min 2X/week        Progress Toward Goals  OT Goals(current goals can now be found in the care plan section)  Progress towards OT goals: Progressing toward  goals(with some mobility)     Plan Discharge plan remains appropriate    Co-evaluation    PT/OT/SLP Co-Evaluation/Treatment: Yes Reason for Co-Treatment: Complexity of the patient's impairments (multi-system involvement);Necessary to address cognition/behavior during functional activity;For patient/therapist safety;To address functional/ADL transfers PT goals addressed during session: Mobility/safety with mobility;Balance;Strengthening/ROM OT goals addressed during session: ADL's and self-care;Strengthening/ROM      AM-PAC OT "6 Clicks" Daily Activity     Outcome Measure   Help from another person eating meals?: Total Help from another person taking care of personal grooming?: A Lot Help from another person toileting, which includes using toliet, bedpan, or urinal?: Total Help from another person bathing (including washing, rinsing, drying)?: A Lot Help from another person to put on and taking off regular upper body clothing?: Total Help from another person to put on and taking off regular lower body clothing?: Total 6 Click Score: 8    End of Session Equipment Utilized During Treatment: Gait belt(sara stedy)  OT Visit Diagnosis: Unsteadiness on feet (R26.81);Other abnormalities of gait and mobility (R26.89);Muscle weakness (generalized) (M62.81);History of falling (Z91.81);Other symptoms and signs involving the nervous system (R29.898)   Activity Tolerance Patient tolerated treatment well   Patient Left in chair;with call bell/phone within reach;with chair alarm set   Nurse Communication Mobility status        Time: [id number] OT Time Calculation (min): 34 min  Charges: OT General Charges $OT Visit: 1 Visit OT Treatments $Self Care/Home Management : 8-22 mins  Ignacia Palmaathy Juli Odom, OTR/L [facility] Pager [phone] Office [phone]      Evette GeorgesLeonard, Avan Gullett Eva 06/12/2019, 10:32 AM

## 2019-06-12 NOTE — Progress Notes (Signed)
Patient's tube feed off since midnight due to emesis. MD notified. Antiemetic order received. Pt. Vomited again at 0645. Zofran given. MD notified.

## 2019-06-12 NOTE — Progress Notes (Signed)
[place] Progress Note Patient Name: Michael Bright DOB: Feb 04, 1956 MRN: [id number]   Date of Service  06/12/2019  HPI/Events of Note  Delirium - increased confusion and hallucinations.   eICU Interventions  Will order: 1. ABG STAT.     Intervention Category Major Interventions: Delirium, psychosis, severe agitation - evaluation and management  [name] 06/12/2019, 12:35 AM

## 2019-06-12 NOTE — Progress Notes (Addendum)
NAME:  Michael Bright, MRN:  [id number], DOB:  [birth year], LOS: 67 ADMISSION DATE:  05/30/2019, CONSULTATION DATE:  05/31/19 REFERRING MD:  Myna Hidalgo  CHIEF COMPLAINT:  Secretions   Brief History   Michael Bright is a 63 y.o. 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  PCCM asked to evaluate early AM 8/12 for poor secretion management. His course has been complicated by ongoing dysphagia resulting in presumed aspiration requiring intubation.   Past Medical History  DM, HTN.  Significant Hospital Events   8/11 > admit. 8/12 > PCCM consult. 8/15 PCCM re consulted  8/15 ETT>>> 8/21 8/21 trach  Consults:  Neuro PCCM  Procedures:  ETT 8/15> 8/21 Trach (DS) 8/21 >>   Significant Diagnostic Tests:  CT head 8/11 > neg. MRI brain 8/11 > small left medulla infarct. Renal US 8/11 > neg. Echo 8/11 > EF 60-65%. KUB 8/24 > no obstruction or free air  Micro Data:  SARS CoV2 8/11 > Neg. MRSA  05/31/2019>> Neg 8/15 Blood Cx >>NGTD 8/15 Sputum Cx rare staph aureus pan sens.    Antimicrobials:  06/03/2019 Unasyn> 8/17 Ancef 8/17 >  Interim history/subjective:  Tolerated ATC overnight since 8/22-8/23 Precedex on & off due to delirium, started seroquel last night Vomited x 2 overnight. TF off since the first episode around midnight.   Objective:  Blood pressure (!) 144/90, pulse (!) 54, temperature 100.1 F (37.8 C), temperature source Axillary, resp. rate (!) 24, height 6' (1.829 m), weight (!) 139.3 kg, SpO2 (!) 81 %.    FiO2 (%):  [28 %-40 %] 35 %   Intake/Output Summary (Last 24 hours) at 06/12/2019 0756 Last data filed at 06/12/2019 0500 Gross per 24 hour  Intake 548.24 ml  Output 1350 ml  Net -801.76 ml   Filed Weights   06/10/19 0448 06/11/19 0500 06/12/19 0500  Weight: (!) 144.7 kg (!) 142.5 kg (!) 139.3 kg    Examination: General: obese middle-aged man laying in bed in NAD HENT: Mucous membranes moist, no rhinorrhea Eyes: anicteric Neck: Trach in place, line, no  significant drainage. CV: Regular rate and rhythm Lungs: Tachypneic, mild rhonchi.  No accessory muscle use. ABD: Obese, soft, active bowel sounds in all 4 quadrants, no tenderness to palpation GU: Foley in place draining clear yellow urine EXT: No pitting edema, no cyanosis.  Good distal pulses. Skin: No rashes Neuro: Awake, looking around the room, nods to answer questions appropriately.     Assessment & Plan:   Acute Respiratory Failure suspected 2/2 aspiration episode on 8/15>> TF suctioned with NTS with acute decompensation.  Trach on 8/21.  Currently doing well on trach collar. -Continue respiratory hygiene -Trach care per protocol.  Continue trach collar unless there is evidence of hypoventilation.  This should be confirmed with a blood gas when there is suspicion unless there is obvious apnea. -VAP orders -Holding tube feeds currently due to risk for aspiration with vomiting -Agree with Seroquel to help get him off Precedex.  Will likely need daytime dosing as well. -He will need aggressive rehabilitation.  He should be appropriate to start working with speech with a Passy-Muir valve. -Due to obstructive sleep apnea, will require return to CPAP at whatever point he is decannulated.  MSSA pneumonia. Received cefazolin through 8/22 -Continue to follow clinically and monitor for recurrence of secretions and infiltrates.  HypoK-resolved Hypophos-resolved  -Continue to follow electrolytes and urine output.  Replete electrolytes PRN.  Aspiration following medullary stroke, tracheostomy in place. -SLP assessment, appreciate  the recommendations.   -N.p.o. for now given vomiting overnight.   DM-Hyperglycemic previously, now euglycemic with tube feeds held. -SSI as per protocol -Lantus decreased to 20 units daily due to tube feeds continuing to be held.  May need dose increased again.  -Sliding scale insulin -Goal blood glucose 140-1 80 while admitted to the ICU  L medulla CVA  -Appreciate neurology's assistance -Dual antiplatelet therapy x3 months, then aspirin monotherapy.   -Atorvastatin daily.  Agitated delirium, encephalopathy.  Suspect some is residual from his CVA, also some degree of residual medications, ICU delirium -Working to titrate off Precedex.  Will require additional agents-agree with Seroquel.  He likely will require daytime dosing.  Precedex is his remaining ICU requirement, which may begin to hinder his progress.   Urinary retention with a history of home Flomax use, suspect chronic BPH. Unable to give per tube.  Urinary retention post Foley removal x2. -Continue Proscar -Goal to remove Foley tomorrow  Vomiting -KUB  Best Practice:  Diet: Holding tube feeding a day due to emesis Pain/Anxiety/Delirium protocol (if indicated): per protocol VAP protocol (if indicated): in place DVT prophylaxis: SCD's / Enoxaparin GI prophylaxis: Pepcid Glucose control: SSI & Lantus Mobility: PT/OT Code Status: Full Family Communication: will update.  Disposition: Continue ICU. Can transition out of the ICU once his Precedex is discontinued  Labs and imaging for past 24 hours reviewed in EMR.    This patient is critically ill with multiple organ system failure which requires frequent high complexity decision making, assessment, support, evaluation, and titration of therapies. This was completed through the application of advanced monitoring technologies and extensive interpretation of multiple databases. During this encounter critical care time was devoted to patient care services described in this note for 35 minutes.  Steffanie DunnLaura P Kirstan Fentress, DO 06/12/19 8:13 AM [facility]    Ms. Lorelle FormosaHanna was updated over the phone and all questions have been answered.   Steffanie DunnLaura P Ellason Segar, DO 06/12/19 1:29 PM  Pulmonary & Critical Care

## 2019-06-12 NOTE — Progress Notes (Signed)
Physical Therapy Treatment Patient Details Name: Michael Bright MRN: [id number] DOB: September 15, 1956 Today's Date: 06/12/2019    History of Present Illness Pt adm with lt sided weakness and numbness. Pt with lt medullary infarct. Intubated 8/15 due to suspected aspiration. trach 8/21. PMH -  DM, HTN    PT Comments    Patient progressing slowly towards PT goals. Now s/p trach collar 8/21 and able to maintain Sp02 in high 90s on 28% Fi02 6L/min 02 during session. This session focused on sitting balance, functional transfers and standing balance. Able to sit EOB with varying assist due to right lateral lean. Able to stand using stedy without knees buckling but noted to have heavy right lateral lean. Able to get to midline with verbal/tactile cues but not sustain. Noted to have a small amount of secretions. Also with drop in SBP upon sitting EOB. Supine BP 142/98 Sitting BP 105/62 Sitting BP post transfer 126/74 Pt reports dizziness sitting EOB and post transfer. Used stedy to transfer to chair and did well with this. Continues to be impulsive with some difficulty problem solving and following 2 step commands consistently. Pt appropriate for CIR. Will follow.   Follow Up Recommendations  CIR     Equipment Recommendations  Other (comment)(defer)    Recommendations for Other Services       Precautions / Restrictions Precautions Precautions: Fall;Other (comment) Precaution Comments: cortrak, watch BP, trach collar Restrictions Weight Bearing Restrictions: No    Mobility  Bed Mobility Overal bed mobility: Needs Assistance Bed Mobility: Supine to Sit     Supine to sit: Max assist;+2 for physical assistance;HOB elevated     General bed mobility comments: Pt needed A to bring legs off of side of bed to right (then he automatically put them back up on bed). When asked to sit up he started up into long sitting and then needed A to bring legs off again to right. Per PT who has seen him before  they had been coming up on left hand side due to vent (now off--trached)--right hand side was harder for pt. Needed A for trunk. He was reaching for end of bed with left UE (but not looking that way) but was not able to achieve this on his own  Transfers Overall transfer level: Needs assistance Equipment used: Ambulation equipment used Transfers: Sit to/from Stand Sit to Stand: Min assist;+2 physical assistance Stand pivot transfers: Total assist(used stedy)       General transfer comment: Used stedy to assist with standing; assist with foot placement/hand placement on bars and pt able to pull self up to standing on numerous occasions with assist of 2. Heavy right lateral lean. Transferred to chair. At one point, pt attempting to walk while in stedy.  Ambulation/Gait                 Stairs             Wheelchair Mobility    Modified Rankin (Stroke Patients Only) Modified Rankin (Stroke Patients Only) Pre-Morbid Rankin Score: No symptoms Modified Rankin: Severe disability     Balance Overall balance assessment: Needs assistance Sitting-balance support: Feet supported;Bilateral upper extremity supported Sitting balance-Leahy Scale: Fair Sitting balance - Comments: Varied intially from zero with heavy lean to right with progression to fair once postioned squarely at EOB Postural control: Posterior lean;Right lateral lean Standing balance support: Bilateral upper extremity supported Standing balance-Leahy Scale: Poor Standing balance comment: External support for standing balance with use of sara stedy, tendency for  right lateral lean with cues to correct. Able to self correct to midline with verbal/tactile cus but not sustain. Able to stand without knees buckling.                            Cognition Arousal/Alertness: Awake/alert Behavior During Therapy: Flat affect Overall Cognitive Status: Difficult to assess                                  General Comments: Mildly impulsive, needing safety cues. Following simple commands.      Exercises      General Comments General comments (skin integrity, edema, etc.): Supine BP 142/98, sitting BP 105/62, sitting BP post transfer 126/74.      Pertinent Vitals/Pain Pain Assessment: No/denies pain    Home Living                      Prior Function            PT Goals (current goals can now be found in the care plan section) Progress towards PT goals: Progressing toward goals    Frequency    Min 3X/week      PT Plan Current plan remains appropriate    Co-evaluation PT/OT/SLP Co-Evaluation/Treatment: Yes Reason for Co-Treatment: Complexity of the patient's impairments (multi-system involvement);Necessary to address cognition/behavior during functional activity;To address functional/ADL transfers;For patient/therapist safety PT goals addressed during session: Mobility/safety with mobility;Balance;Strengthening/ROM OT goals addressed during session: ADL's and self-care;Strengthening/ROM      AM-PAC PT "6 Clicks" Mobility   Outcome Measure  Help needed turning from your back to your side while in a flat bed without using bedrails?: A Lot Help needed moving from lying on your back to sitting on the side of a flat bed without using bedrails?: Total Help needed moving to and from a bed to a chair (including a wheelchair)?: Total Help needed standing up from a chair using your arms (e.g., wheelchair or bedside chair)?: A Lot Help needed to walk in hospital room?: Total Help needed climbing 3-5 steps with a railing? : Total 6 Click Score: 8    End of Session Equipment Utilized During Treatment: Gait belt;Other (comment)(trach collar, 28% Fi02 6L/min 02) Activity Tolerance: Patient tolerated treatment well Patient left: in chair;with call bell/phone within reach;with chair alarm set Nurse Communication: Mobility status;Need for lift equipment(use stedy) PT  Visit Diagnosis: Unsteadiness on feet (R26.81);Hemiplegia and hemiparesis Hemiplegia - Right/Left: Left Hemiplegia - dominant/non-dominant: Non-dominant Hemiplegia - caused by: Cerebral infarction     Time: [id number] PT Time Calculation (min) (ACUTE ONLY): 31 min  Charges:  $Therapeutic Activity: 8-22 mins                     Mylo RedShauna Robbie Rideaux, PT, DPT Acute Rehabilitation Services Pager [phone] Office [phone]       Blake DivineShauna A Lanier EnsignHartshorne 06/12/2019, 12:31 PM

## 2019-06-12 NOTE — Progress Notes (Signed)
[facility] Progress Note Patient Name: Michael Bright DOB: 02/14/1956 MRN: [id number]   Date of Service  06/12/2019  HPI/Events of Note  Multiple issues: 1. ABG on 28% Trach Collar = 7.403/42.3/32.0 (? If venous gas as sat on oximeter = 100%.) Hypercarbia is not the cause of increased confusion and hallucinations and 2. Nausea - QTc = 0.36 seconds.   eICU Interventions  Will order: 1. Zofran 4 mg IV Q 6 hours PRN N/V.     Intervention Category Major Interventions: Acid-Base disturbance - evaluation and management  Oma Marzan Eugene 06/12/2019, 2:09 AM

## 2019-06-12 NOTE — Evaluation (Signed)
Passy-Muir Speaking Valve - Evaluation Patient Details  Name: Michael Bright MRN: [id number] Date of Birth: Dec 26, 1955  Today's Date: 06/12/2019 Time: 1444-1500 SLP Time Calculation (min) (ACUTE ONLY): 16 min  Past Medical History: History reviewed. No pertinent past medical history. Past Surgical History:  Past Surgical History:  Procedure Laterality Date  . CHOLECYSTECTOMY     HPI:  Michael Bright is a 63 y.o. male with hx of myotonic dystrophy 2 (DM2) and diabetes who presented with difficulty swallowing. Pt was transferred to ICU 8/11 for acute hypoxic respiratory failure most likely 2/2 aspiration in setting of stroke, resulting in aspiration PNA. L side hemiparesis, and diplopia. 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 Evaluated for dysphagia 8/12 and cog/ling with diagnoses of severe oropharyngeal dysphagia (NPO) and mod dysarthria. 8/14 Intubated 8/15, evaluated by SLP 8/19 with recommendation for trach. Trach placed 8/21 with SLP recommendation for PMV same day. Experienced worsening paranoia and delirium 8/23. Pt on tube feed, experiencing nausa and emesis 8/24.      Assessment / Plan / Recommendation Clinical Impression  Pt was alert and responsive during PMV assessment with pilot balloon/cuff deflated at baseline. RN present and suctioning copious amounts of phlegm that RN thought resembled tube feedings. Pt was uable to effectively use speaking valve today due to a combination of reduced respiratory support, limited air to vocal cords due to narrowed trachea from deflated cuff and mucous. SLP facilitated coordination of inhalation and coughs with placement and removal of valve but not successful to clear mucous. Attepmts at vocalization was more of a whisper with resonation from false folds. Trach change to cuffless would assist in mobilization of air to vocal cords.  SLP Visit Diagnosis: Aphonia (R49.1)    SLP Assessment  Patient needs continued Speech Lanaguage Pathology Services     Follow Up Recommendations  Inpatient Rehab    Frequency and Duration min 2x/week  2 weeks    PMSV Trial PMSV was placed for: interval up to 5-10 seconds Able to redirect subglottic air through upper airway: Yes Able to Attain Phonation: No Voice Quality: Low vocal intensity(dysphonic) Able to Expectorate Secretions: No Breath Support for Phonation: Moderately decreased Intelligibility: Intelligibility reduced Word: 0-24% accurate Respirations During Trial: 20 SpO2 During Trial: 99 % Pulse During Trial: 80 Behavior: Alert;Controlled;Cooperative   Tracheostomy Tube       Vent Dependency  FiO2 (%): 28 %    Cuff Deflation Trial  GO Tolerated Cuff Deflation: (deflated at baseline) Behavior: Alert;Controlled;Responsive to questions        Houston Siren 06/12/2019, 7:26 PM   Orbie Pyo Colvin Caroli.Ed Risk analyst [phone] Office [phone]

## 2019-06-12 NOTE — Progress Notes (Signed)
Nutrition Follow-up  DOCUMENTATION CODES:   Morbid obesity  INTERVENTION:   Once able to resume TF  Osmolite 1.2 @ 60 ml/hr 30 ml Prostat TID  Provides: 2028 kcal, 125 grams protein, and 1167 ml free water.   NUTRITION DIAGNOSIS:   Inadequate oral intake related to dysphagia as evidenced by NPO status; ongoing  GOAL:   Provide needs based on ASPEN/SCCM guidelines; progressing  MONITOR:   TF tolerance, Vent status, Weight trends, Labs, I & O's  REASON FOR ASSESSMENT:   Consult Enteral/tube feeding initiation and management  ASSESSMENT:   63 yo male admitted on 8/11 with L medulla CVA with dysphagia. PMH includes DM, HTN  8/12 Transferred to ICU for respiratory decompensation, unable to clear secretions 8/13 Cortrak inserted by Diagnostics, TF initiated 8/14 Cortrak replaced (gastric)  8/15 intubated from presumed aspiration 8/21 trach placed 8/22 transitioned to trach collar  8/24 TF held at midnight due to emesis x 2, KUB WNL   Labs and medications reviewed.   Current order TF: Vital AF 1.2 via Cortrak NGT at goal rate of 40 ml/h (960 ml per day) and Prostat 60 ml TID to provide 1752 kcals, 162 gm protein, 778 ml free water daily.   Diet Order:   Diet Order            Diet NPO time specified  Diet effective midnight              EDUCATION NEEDS:   Not appropriate for education at this time  Skin:  Skin Assessment: Reviewed RN Assessment Skin Integrity Issues:: Other (Comment) Other: MASD- sacrum  Last BM:  8/23  Height:   Ht Readings from Last 1 Encounters:  06/03/19 6' (1.829 m)    Weight:   Wt Readings from Last 1 Encounters:  06/12/19 (!) 139.3 kg    Ideal Body Weight:  80.9 kg  BMI:  Body mass index is 41.65 kg/m.  Estimated Nutritional Needs:   Kcal:  2000-2300  Protein:  110-130 grams  Fluid:  2 L  [place], LDN, CNSC [phone] Pager [phone] After Hours Pager

## 2019-06-13 ENCOUNTER — Inpatient Hospital Stay (HOSPITAL_COMMUNITY): Payer: BC Managed Care – PPO

## 2019-06-13 DIAGNOSIS — E1159 Type 2 diabetes mellitus with other circulatory complications: Secondary | ICD-10-CM

## 2019-06-13 DIAGNOSIS — I10 Essential (primary) hypertension: Secondary | ICD-10-CM

## 2019-06-13 LAB — CBC
HCT: 40.9 % (ref 39.0–52.0)
Hemoglobin: 13.3 g/dL (ref 13.0–17.0)
MCH: 30.2 pg (ref 26.0–34.0)
MCHC: 32.5 g/dL (ref 30.0–36.0)
MCV: 93 fL (ref 80.0–100.0)
Platelets: 353 10*3/uL (ref 150–400)
RBC: 4.4 MIL/uL (ref 4.22–5.81)
RDW: 11.9 % (ref 11.5–15.5)
WBC: 15.5 10*3/uL — ABNORMAL HIGH (ref 4.0–10.5)
nRBC: 0 % (ref 0.0–0.2)

## 2019-06-13 LAB — GLUCOSE, CAPILLARY
Glucose-Capillary: 114 mg/dL — ABNORMAL HIGH (ref 70–99)
Glucose-Capillary: 123 mg/dL — ABNORMAL HIGH (ref 70–99)
Glucose-Capillary: 149 mg/dL — ABNORMAL HIGH (ref 70–99)
Glucose-Capillary: 168 mg/dL — ABNORMAL HIGH (ref 70–99)
Glucose-Capillary: 217 mg/dL — ABNORMAL HIGH (ref 70–99)

## 2019-06-13 MED ORDER — VANCOMYCIN HCL 10 G IV SOLR
1500.0000 mg | Freq: Two times a day (BID) | INTRAVENOUS | Status: DC
  Administered 2019-06-13 – 2019-06-14 (×2): 1500 mg via INTRAVENOUS
  Filled 2019-06-13 (×4): qty 1500

## 2019-06-13 MED ORDER — PIPERACILLIN-TAZOBACTAM 4.5 G IVPB
4.5000 g | Freq: Three times a day (TID) | INTRAVENOUS | Status: DC
  Administered 2019-06-13 – 2019-06-14 (×3): 4.5 g via INTRAVENOUS
  Filled 2019-06-13 (×6): qty 100

## 2019-06-13 MED ORDER — VANCOMYCIN HCL 10 G IV SOLR
2000.0000 mg | Freq: Once | INTRAVENOUS | Status: AC
  Administered 2019-06-13: 2000 mg via INTRAVENOUS
  Filled 2019-06-13: qty 2000

## 2019-06-13 NOTE — Progress Notes (Signed)
NAME:  Michael Bright, MRN:  [id number], DOB:  [birth year], LOS: 82 ADMISSION DATE:  05/30/2019, CONSULTATION DATE:  05/31/19 REFERRING MD:  Myna Hidalgo  CHIEF COMPLAINT:  Secretions   Brief History   Michael Bright is a 63 y.o. 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  PCCM asked to evaluate early AM 8/12 for poor secretion management. His course has been complicated by ongoing dysphagia resulting in presumed aspiration requiring intubation.   Past Medical History  DM, HTN.  Significant Hospital Events   8/11 > admit. 8/12 > PCCM consult. 8/15 PCCM re consulted  8/15 ETT>>> 8/21 8/21 trach  Consults:  Neuro PCCM  Procedures:  ETT 8/15> 8/21 Trach (DS) 8/21 >>   Significant Diagnostic Tests:  CT head 8/11 > neg. MRI brain 8/11 > small left medulla infarct. Renal US 8/11 > neg. Echo 8/11 > EF 60-65%. KUB 8/24 > no obstruction or free air  Micro Data:  SARS CoV2 8/11 > Neg. MRSA  05/31/2019>> Neg 8/15 Blood Cx >>NGTD 8/15 Sputum Cx rare staph aureus pan sens.    Antimicrobials:  06/03/2019 Unasyn> 8/17 Ancef 8/17 >  Interim history/subjective:  Tolerated ATC overnight since 8/22-8/23 Precedex off since 1AM on 8/25 No additional episodes of vomiting since AM on 8/24. TF have been off during this. Minimal ability to speak with SLP- requesting uncuffed trach.   Objective:  Blood pressure (!) 152/65, pulse 96, temperature (!) 100.5 F (38.1 C), resp. rate (!) 21, height 6' (1.829 m), weight (!) 138.5 kg, SpO2 100 %.    FiO2 (%):  [28 %-35 %] 35 %   Intake/Output Summary (Last 24 hours) at 06/13/2019 0843 Last data filed at 06/13/2019 0830 Gross per 24 hour  Intake 450.04 ml  Output 1135 ml  Net -684.96 ml   Filed Weights   06/11/19 0500 06/12/19 0500 06/13/19 0353  Weight: (!) 142.5 kg (!) 139.3 kg (!) 138.5 kg    Examination: General: Obese, minimally agitated middle-aged man lying in bed in no acute distress HENT: Moist mucous membranes, no rhinorrhea. NGT  in place. Eyes: Anicteric, tracking around the room Neck: Trach in place with yellow secretions.  Balloon deflated but unable to speak CV: Regular rate and rhythm, no murmurs Lungs: Tachypneic, rhonchi cleared with suctioning.  No accessory muscle use.  Breathing comfortably on trach collar, but gets very tachypneic when he attempts to speak. No ability to phonate.  ABD: Obese, soft, nontender, nondistended GU: Foley in place EXT: No pitting edema, no cyanosis, no foot wounds. Skin: No rashes Neuro: Awake, tracking around the room, moving all extremities spontaneously. Psych: Appears to be hallucinating that there is a person in the room, mildly agitated   Assessment & Plan:   Acute Respiratory Failure suspected 2/2 aspiration episode on 8/15>> TF suctioned with NTS with acute decompensation.  Trach on 8/21.  Currently doing well on trach collar, but concerning secretions with a new fever. -Chest x-ray this morning -Sputum cultures -Continue respiratory hygiene -Trach care per protocol.  Continue trach collar unless there is evidence of hypoventilation, which should be confirmed with a blood gas unless there is frank apnea. -VAP prevention -Restart tube feeds -Continue Seroquel and avoid Precedex. -I am concerned that he is unable to phonate with a Passy-Muir valve.  Given his new fevers, I am concerned that he may have developed a pneumonia, which could deteriorate to him requiring mechanical ventilation again.  Due to this I will not downsize his trach to an uncuffed  trach.  In the future he may require bronchoscopy to evaluate for stenosis around his trach. -Due to obstructive sleep apnea, will require return to CPAP at whatever point he is decannulated.  MSSA pneumonia. Received cefazolin through 8/22 -Reculture blood and sputum - Will empirically start VAP antibiotics  HypoK-resolved Hypophos-resolved  -Continue to monitor electrolytes and replete as needed  Aspiration  following medullary stroke, tracheostomy in place. -Appreciate SLP's assistance.  Would like to downsize trach soon, but will defer today due to concern for recurrent or new pneumonia.  DM- Hyperglycemic previously, now euglycemic with tube feeds held. -Every 4 hours Accu-Cheks and sliding scale insulin PRN -Lantus decreased to 20 units daily due to tube feeds continuing to be held.  Anticipate when he is back to goal feeds that he may require more. -Goal blood glucose 140-180 while admitted to the ICU  L medulla CVA -Appreciate neurology's assistance -Dual antiplatelet therapy for 3 months, followed by aspirin monotherapy -Continue atorvastatin  Agitated delirium, encephalopathy.  Suspect some is residual from his CVA, also some degree of residual medications, ICU delirium -Continue atypical antipsychotics to control delirium.  Would like to avoid restarting Precedex to facilitate him being able to discharge from the ICU soon.  Urinary retention with a history of home Flomax use, suspect chronic BPH. Unable to give per tube.  Urinary retention post Foley removal x2. -Continue Proscar -Remove Foley, bladder scans as needed  Vomiting-resolved  Best Practice:  Diet: Restart tube feeds Pain/Anxiety/Delirium protocol (if indicated): per protocol VAP protocol (if indicated): in place DVT prophylaxis: SCD's / Enoxaparin GI prophylaxis: Pepcid Glucose control: SSI & Lantus Mobility: PT/OT Code Status: Full Family Communication: updated wife on 8/25 Disposition: Continue ICU-can transition out of ICU soon as long as he stays off Precedex.  Labs and imaging for past 24 hours reviewed in EMR.   This patient is critically ill with multiple organ system failure which requires frequent high complexity decision making, assessment, support, evaluation, and titration of therapies. This was completed through the application of advanced monitoring technologies and extensive interpretation of  multiple databases. During this encounter critical care time was devoted to patient care services described in this note for 45 minutes.  Steffanie DunnLaura P Kenleigh Toback, DO 06/13/19 8:56 AM [facility]

## 2019-06-13 NOTE — Progress Notes (Signed)
Report received from [place] at 1944. Pt on unit at 2035, AOx4.

## 2019-06-13 NOTE — Progress Notes (Signed)
Pharmacy Antibiotic Note  Michael Bright is a 63 y.o. male admitted on 05/30/2019 with CVA now with TC, new fevers and concern for PNA .  Pharmacy has been consulted for vancomycin dosing.  Zosyn per MD.    Plan: Vancomycin 2000mg  IV x 1, then 1500mg  IV every 12 hours Goal AUC 400-550. Expected AUC: 489 SCr used: 0.83 Monitor renal function, Cx and clinical progression Vancomycin levels as needed   Height: 6' (182.9 cm) Weight: (!) 305 lb 5.4 oz (138.5 kg) IBW/kg (Calculated) : 77.6  Temp (24hrs), Avg:99.1 F (37.3 C), Min:97.7 F (36.5 C), Max:100.5 F (38.1 C)  Recent Labs  Lab 06/08/19 0510 06/09/19 0506 06/10/19 1625 06/11/19 0514 06/12/19 0547 06/13/19 0341  WBC 9.8 8.8 12.2* 12.6* 14.5* 15.5*  CREATININE 0.86 0.83 0.77 0.81 0.83  --     Estimated Creatinine Clearance: 133.1 mL/min (by C-G formula based on SCr of 0.83 mg/dL).    No Known Allergies  Antimicrobials this admission: Unasyn 8/15 >>8/17  Cefazolin 8/17 >> 8/22 Vanc 8/25>> Zosyn 8/25>>  Dose adjustments this admission: n/a  Microbiology results: 8/15 TA - MSSA 8/15 BCx - ngtd 8/12 MRSA pcr negative 8/11 COVID negative  Bertis Ruddy, PharmD Clinical Pharmacist Please check AMION for all [facility] numbers 06/13/2019 9:07 AM

## 2019-06-13 NOTE — Progress Notes (Signed)
Transferred pt to 3W- waited for RN for 15 minutes. Gave updates and saw patient with NT.

## 2019-06-13 NOTE — Progress Notes (Signed)
Patient being transferred to 3W28, report given to Unit RRT. No distress or complications noted at this time.

## 2019-06-13 NOTE — Progress Notes (Signed)
  Speech Language Pathology Treatment:    Patient Details Name: Michael Bright MRN: [id number] DOB: [birth year] Today's Date: 06/13/2019 Time: 1131-1201 SLP Time Calculation (min) (ACUTE ONLY): 30 min  Assessment / Plan / Recommendation Clinical Impression  Pt seen for use with Passy-Muir speaking valve. Pt has excessive secretions with very wet quality during phonation trials. He attempts to cough but it is weak and ineffective with mucous remaining around glottal area despite use of valve to provide necessary pressure. SLP varied his head of bed positioning adjusting pressure on diaphragm. He was unable to phonate and with mild indications of inefficient exhalations and valve initially popping off trach hub- in place for approximately 10 seconds. PMV with ST only.  Pt able to write part of message after attempts to gesture and mouth phrases and student SLP able to decode message. Communication board trialed which was effective to point to desired picture of letter. RN educated to use writing with larger marker or sharpie or use communication board for pt expression.    HPI HPI: Michael Bright is a 63 y.o. male with hx of myotonic dystrophy 2 (DM2) and diabetes who presented with difficulty swallowing. Pt was transferred to ICU 8/11 for acute hypoxic respiratory failure most likely 2/2 aspiration in setting of stroke, resulting in aspiration PNA. L side hemiparesis, and diplopia. 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 Evaluated for dysphagia 8/12 and cog/ling with diagnoses of severe oropharyngeal dysphagia (NPO) and mod dysarthria. 8/14 Intubated 8/15, evaluated by SLP 8/19 with recommendation for trach. Trach placed 8/21 with SLP recommendation for PMV same day. Experienced worsening paranoia and delirium 8/23. Pt on tube feed, experiencing nausa and emesis 8/24.         SLP Plan  Continue with current plan of care  Patient needs continued Speech Lanaguage Pathology Services    Recommendations          Patient may use Passy-Muir Speech Valve: with SLP only PMSV Supervision: Full MD: Please consider changing trach tube to : Cuffless         Oral Care Recommendations: Oral care QID Follow up Recommendations: Inpatient Rehab SLP Visit Diagnosis: Aphonia (R49.1) Plan: Continue with current plan of care                       Houston Siren 06/13/2019, 3:49 PM   Orbie Pyo Colvin Caroli.Ed Risk analyst [phone] Office [phone]

## 2019-06-14 DIAGNOSIS — I10 Essential (primary) hypertension: Secondary | ICD-10-CM

## 2019-06-14 DIAGNOSIS — Z930 Tracheostomy status: Secondary | ICD-10-CM

## 2019-06-14 DIAGNOSIS — E1169 Type 2 diabetes mellitus with other specified complication: Secondary | ICD-10-CM

## 2019-06-14 DIAGNOSIS — J690 Pneumonitis due to inhalation of food and vomit: Secondary | ICD-10-CM

## 2019-06-14 DIAGNOSIS — J9611 Chronic respiratory failure with hypoxia: Secondary | ICD-10-CM

## 2019-06-14 DIAGNOSIS — E785 Hyperlipidemia, unspecified: Secondary | ICD-10-CM

## 2019-06-14 HISTORY — DX: Pneumonitis due to inhalation of food and vomit: J69.0

## 2019-06-14 LAB — GLUCOSE, CAPILLARY
Glucose-Capillary: 169 mg/dL — ABNORMAL HIGH (ref 70–99)
Glucose-Capillary: 189 mg/dL — ABNORMAL HIGH (ref 70–99)
Glucose-Capillary: 207 mg/dL — ABNORMAL HIGH (ref 70–99)
Glucose-Capillary: 207 mg/dL — ABNORMAL HIGH (ref 70–99)
Glucose-Capillary: 214 mg/dL — ABNORMAL HIGH (ref 70–99)
Glucose-Capillary: 218 mg/dL — ABNORMAL HIGH (ref 70–99)

## 2019-06-14 LAB — CBC
HCT: 38 % — ABNORMAL LOW (ref 39.0–52.0)
Hemoglobin: 12.2 g/dL — ABNORMAL LOW (ref 13.0–17.0)
MCH: 30.4 pg (ref 26.0–34.0)
MCHC: 32.1 g/dL (ref 30.0–36.0)
MCV: 94.8 fL (ref 80.0–100.0)
Platelets: 361 10*3/uL (ref 150–400)
RBC: 4.01 MIL/uL — ABNORMAL LOW (ref 4.22–5.81)
RDW: 12.2 % (ref 11.5–15.5)
WBC: 14.8 10*3/uL — ABNORMAL HIGH (ref 4.0–10.5)
nRBC: 0 % (ref 0.0–0.2)

## 2019-06-14 MED ORDER — WHITE PETROLATUM EX OINT
TOPICAL_OINTMENT | CUTANEOUS | Status: AC
  Administered 2019-06-14: 0.2
  Filled 2019-06-14: qty 28.35

## 2019-06-14 MED ORDER — IBUPROFEN 200 MG PO TABS
400.0000 mg | ORAL_TABLET | Freq: Four times a day (QID) | ORAL | Status: DC | PRN
  Administered 2019-06-14 – 2019-06-18 (×5): 400 mg via NASOGASTRIC
  Filled 2019-06-14 (×6): qty 2

## 2019-06-14 MED ORDER — FINASTERIDE 5 MG PO TABS
5.0000 mg | ORAL_TABLET | Freq: Every day | ORAL | Status: DC
  Administered 2019-06-14 – 2019-06-27 (×14): 5 mg via ORAL
  Filled 2019-06-14 (×13): qty 1

## 2019-06-14 MED ORDER — CEFAZOLIN SODIUM-DEXTROSE 2-4 GM/100ML-% IV SOLN
2.0000 g | Freq: Three times a day (TID) | INTRAVENOUS | Status: DC
  Administered 2019-06-14 – 2019-06-19 (×15): 2 g via INTRAVENOUS
  Filled 2019-06-14 (×17): qty 100

## 2019-06-14 MED ORDER — CEFAZOLIN SODIUM-DEXTROSE 1-4 GM/50ML-% IV SOLN
1.0000 g | Freq: Three times a day (TID) | INTRAVENOUS | Status: DC
  Filled 2019-06-14: qty 50

## 2019-06-14 MED ORDER — GLYCOPYRROLATE 1 MG PO TABS
1.0000 mg | ORAL_TABLET | Freq: Two times a day (BID) | ORAL | Status: DC
  Administered 2019-06-14 – 2019-06-22 (×17): 1 mg
  Filled 2019-06-14 (×18): qty 1

## 2019-06-14 NOTE — Plan of Care (Signed)
  Problem: Education: Goal: Knowledge of disease or condition will improve Outcome: Progressing Goal: Knowledge of secondary prevention will improve Outcome: Progressing Goal: Knowledge of patient specific risk factors addressed and post discharge goals established will improve Outcome: Progressing   Problem: Coping: Goal: Will verbalize positive feelings about self Outcome: Progressing   Problem: Health Behavior/Discharge Planning: Goal: Ability to manage health-related needs will improve Outcome: Progressing   Problem: Self-Care: Goal: Ability to participate in self-care as condition permits will improve Outcome: Progressing Goal: Verbalization of feelings and concerns over difficulty with self-care will improve Outcome: Progressing Goal: Ability to communicate needs accurately will improve Outcome: Progressing   Problem: Nutrition: Goal: Risk of aspiration will decrease Outcome: Progressing Goal: Dietary intake will improve Outcome: Progressing   Problem: Ischemic Stroke/TIA Tissue Perfusion: Goal: Complications of ischemic stroke/TIA will be minimized Outcome: Progressing   Problem: Health Behavior/Discharge Planning: Goal: Ability to manage health-related needs will improve Outcome: Progressing   Problem: Clinical Measurements: Goal: Ability to maintain clinical measurements within normal limits will improve Outcome: Progressing Goal: Will remain free from infection Outcome: Progressing Goal: Diagnostic test results will improve Outcome: Progressing   Problem: Pain Managment: Goal: General experience of comfort will improve Outcome: Progressing   Problem: Safety: Goal: Ability to remain free from injury will improve Outcome: Progressing   Problem: Activity: Goal: Ability to tolerate increased activity will improve Outcome: Progressing   Problem: Respiratory: Goal: Ability to maintain a clear airway and adequate ventilation will improve Outcome:  Progressing   Problem: Role Relationship: Goal: Method of communication will improve Outcome: Progressing   Michael Bright, BSN, RN 

## 2019-06-14 NOTE — Plan of Care (Signed)
  Problem: Education: Goal: Knowledge of disease or condition will improve Outcome: Progressing Goal: Knowledge of secondary prevention will improve Outcome: Progressing Goal: Knowledge of patient specific risk factors addressed and post discharge goals established will improve Outcome: Progressing   Problem: Coping: Goal: Will verbalize positive feelings about self Outcome: Progressing   Problem: Health Behavior/Discharge Planning: Goal: Ability to manage health-related needs will improve Outcome: Progressing   Problem: Self-Care: Goal: Ability to participate in self-care as condition permits will improve Outcome: Progressing Goal: Verbalization of feelings and concerns over difficulty with self-care will improve Outcome: Progressing Goal: Ability to communicate needs accurately will improve Outcome: Progressing   Problem: Nutrition: Goal: Risk of aspiration will decrease Outcome: Progressing Goal: Dietary intake will improve Outcome: Progressing   Problem: Ischemic Stroke/TIA Tissue Perfusion: Goal: Complications of ischemic stroke/TIA will be minimized Outcome: Progressing   Problem: Health Behavior/Discharge Planning: Goal: Ability to manage health-related needs will improve Outcome: Progressing   Problem: Clinical Measurements: Goal: Ability to maintain clinical measurements within normal limits will improve Outcome: Progressing Goal: Will remain free from infection Outcome: Progressing Goal: Diagnostic test results will improve Outcome: Progressing   Problem: Pain Managment: Goal: General experience of comfort will improve Outcome: Progressing   Problem: Safety: Goal: Ability to remain free from injury will improve Outcome: Progressing   Problem: Activity: Goal: Ability to tolerate increased activity will improve Outcome: Progressing   Problem: Respiratory: Goal: Ability to maintain a clear airway and adequate ventilation will improve Outcome:  Progressing   Problem: Role Relationship: Goal: Method of communication will improve Outcome: Progressing   Ival Bible, BSN, RN

## 2019-06-14 NOTE — Progress Notes (Signed)
Occupational Therapy Treatment Patient Details Name: Teodoro KilRandolph Nesbit MRN: [id number] DOB: 22-Apr-1956 Today's Date: 06/14/2019    History of present illness Pt adm with lt sided weakness and numbness. Pt with lt medullary infarct. Intubated 8/15 due to suspected aspiration. trach 8/21. PMH -  DM, HTN   OT comments  Pt making steady progress towards goals this session. Pt able to sit on EOB for 5 minutes with close supervision - min A for static sitting balance. Sit <>stand x 3 this session with min A of 2. Mobility with several steps to recliner chair with mod A of 2 secondary to balance and R lateral lean. HR increasing to the 120's with activity and pt with trach collar 28% FiO2 6L/min of O dropping to 88% at the lowest but quickly recovering. Pt continues to benefit from OT intervention. He does fatigue very quickly.   Follow Up Recommendations  CIR;Supervision/Assistance - 24 hour    Equipment Recommendations  Other (comment)(defer to next venue of care)       Precautions / Restrictions Precautions Precautions: Fall;Other (comment) Precaution Comments: cortrak, watch BP, trach collar Restrictions Weight Bearing Restrictions: No       Mobility Bed Mobility Overal bed mobility: Needs Assistance Bed Mobility: Supine to Sit     Supine to sit: Mod assist;+2 for physical assistance;HOB elevated     General bed mobility comments: multimodal cuing for hand placement and assist for B LEs and trunk to EOB  Transfers Overall transfer level: Needs assistance   Transfers: Sit to/from Stand Sit to Stand: Min assist;+2 physical assistance Stand pivot transfers: Mod assist;+2 safety/equipment       General transfer comment: min of 2 for standing but R lateral lean with ambulating requiring mod of 2 for safety. Attempted RW but pt was unable to utilize safely.    Balance Overall balance assessment: Needs assistance Sitting-balance support: Feet supported;Bilateral upper extremity  supported Sitting balance-Leahy Scale: Fair Sitting balance - Comments: close supervision - min A for static sitting balance Postural control: Right lateral lean Standing balance support: Bilateral upper extremity supported Standing balance-Leahy Scale: Poor Standing balance comment: able to stand without knees buckling but R lateral lean with stand pivot transfer          ADL either performed or assessed with clinical judgement        Vision Baseline Vision/History: Wears glasses Wears Glasses: Distance only Patient Visual Report: No change from baseline            Cognition Arousal/Alertness: Awake/alert Behavior During Therapy: Flat affect Overall Cognitive Status: Difficult to assess         General Comments: Mildly impulsive, needing safety cues. Following simple commands.                   Pertinent Vitals/ Pain       Pain Assessment: Faces Faces Pain Scale: Hurts a little bit Pain Location: Both arms Pain Descriptors / Indicators: Discomfort Pain Intervention(s): Monitored during session;Repositioned         Frequency  Min 2X/week        Progress Toward Goals  OT Goals(current goals can now be found in the care plan section)  Progress towards OT goals: Progressing toward goals  Acute Rehab OT Goals OT Goal Formulation: Patient unable to participate in goal setting  Plan Discharge plan remains appropriate    Co-evaluation    PT/OT/SLP Co-Evaluation/Treatment: Yes Reason for Co-Treatment: Complexity of the patient's impairments (multi-system involvement);Necessary to address cognition/behavior during  functional activity;For patient/therapist safety;To address functional/ADL transfers   OT goals addressed during session: ADL's and self-care;Strengthening/ROM;Proper use of Adaptive equipment and DME      AM-PAC OT "6 Clicks" Daily Activity     Outcome Measure   Help from another person eating meals?: Total Help from another person  taking care of personal grooming?: A Lot Help from another person toileting, which includes using toliet, bedpan, or urinal?: Total Help from another person bathing (including washing, rinsing, drying)?: A Lot Help from another person to put on and taking off regular upper body clothing?: Total Help from another person to put on and taking off regular lower body clothing?: Total 6 Click Score: 8    End of Session Equipment Utilized During Treatment: Gait belt;Rolling walker  OT Visit Diagnosis: Unsteadiness on feet (R26.81);Other abnormalities of gait and mobility (R26.89);Muscle weakness (generalized) (M62.81);History of falling (Z91.81);Other symptoms and signs involving the nervous system (R29.898)   Activity Tolerance Patient tolerated treatment well   Patient Left in chair;with call bell/phone within reach;with chair alarm set   Nurse Communication Mobility status        Time: 3888-2800 OT Time Calculation (min): 30 min  Charges: OT General Charges $OT Visit: 1 Visit OT Treatments $Therapeutic Activity: 8-22 mins   Sherill Mangen, Latanya Presser, MS, OTR/L 06/14/2019, 3:33 PM

## 2019-06-14 NOTE — Progress Notes (Signed)
   NAME:  Michael Bright, MRN:  [id number], DOB:  1956-09-07, LOS: 50 ADMISSION DATE:  05/30/2019, CONSULTATION DATE:  05/31/19 REFERRING MD:  Myna Hidalgo  CHIEF COMPLAINT:  Secretions   Brief History   Michael Bright is a 63 y.o. 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  PCCM asked to evaluate early AM 8/12 for poor secretion management. His course has been complicated by ongoing dysphagia resulting in presumed aspiration requiring intubation.   Past Medical History  DM, HTN.  Significant Hospital Events   8/11 > admit. 8/12 > PCCM consult. 8/15 PCCM re consulted  8/15 ETT>>> 8/21 8/21 trach  Consults:  Neuro PCCM  Procedures:  ETT 8/15> 8/21 Trach (DS) 8/21 >>   Significant Diagnostic Tests:  CT head 8/11 > neg. MRI brain 8/11 > small left medulla infarct. Renal US 8/11 > neg. Echo 8/11 > EF 60-65%. KUB 8/24 > no obstruction or free air  Micro Data:  SARS CoV2 8/11 > Neg. MRSA  05/31/2019>> Neg 8/15 Blood Cx >>NGTD 8/15 Sputum Cx rare staph aureus pan sens.    Antimicrobials:  06/03/2019 Unasyn> 8/17 Ancef 8/17 >  Interim history/subjective:  No events overnight, tolerating TC at 28%  Objective:  Blood pressure (!) 148/64, pulse (!) 113, temperature 99.3 F (37.4 C), resp. rate (!) 25, height 6' (1.829 m), weight (!) 143.8 kg, SpO2 100 %.    FiO2 (%):  [28 %-35 %] 28 %   Intake/Output Summary (Last 24 hours) at 06/14/2019 1403 Last data filed at 06/14/2019 1209 Gross per 24 hour  Intake 8186.36 ml  Output 800 ml  Net 7386.36 ml   Filed Weights   06/12/19 0500 06/13/19 0353 06/14/19 0459  Weight: (!) 139.3 kg (!) 138.5 kg (!) 143.8 kg    Examination: General: Chronically ill appearing male, NAD HENT: Moist mucous membranes, no rhinorrhea. NGT in place. Eyes: Anicteric, tracking around the room Neck: Trach in place CV: RRR, Nl S1/S2 and -M/R/G Lungs: Coarse but clear, cuff up  ABD: Obese, soft, nontender, nondistended GU: Foley in place EXT: No pitting  edema, no cyanosis, no foot wounds. Skin: No rashes Neuro: Awake, tracking around the room, moving all extremities spontaneously. Psych: Appears to be hallucinating that there is a person in the room, mildly agitated  I reviewed CXR myself, trach is in a good position  Assessment & Plan:   Acute Respiratory Failure suspected 2/2 aspiration episode on 8/15>> TF suctioned with NTS with acute decompensation.  Trach on 8/21.  Currently doing well on trach collar, but concerning secretions with a new fever. - CXR to PRN at this point - F/u on cultures - Trach care per protocol - Titrate O2 for sat of 88-92% - VAP prevention - SLP when neurologically more improved - PMV per SLP - Due to obstructive sleep apnea, will require return to CPAP at whatever point he is decannulated.  PCCM will continue to follow for trach care  Rush Farmer, M.D. [facility] Pulmonary/Critical Care Medicine. Pager: [phone]. After hours pager: [phone].

## 2019-06-14 NOTE — Progress Notes (Signed)
Inpatient Rehabilitation-Admissions Coordinator   [facility] continues to follow for medical readiness. Will need updated therapy notes prior to opening insurance case for possible CIR admission.   Will follow for therapy notes.   Jhonnie Garner, OTR/L  Rehab Admissions Coordinator  [phone] 06/14/2019 3:04 PM

## 2019-06-14 NOTE — Progress Notes (Signed)
Physical Therapy Treatment Patient Details Name: Michael Bright MRN: [id number] DOB: [birth year] Today's Date: 06/14/2019    History of Present Illness Pt adm with lt sided weakness and numbness. Pt with lt medullary infarct. Intubated 8/15 due to suspected aspiration. trach 8/21. PMH -  DM, HTN    PT Comments    Patient seen for mobility progression. Pt requires min/mod A +2 for functional transfer training due to R lateral bias and R knee buckling. Continue to recommend CIR for further skilled PT services to maximize independence and safety with mobility.    Follow Up Recommendations  CIR     Equipment Recommendations  Other (comment)(defer)    Recommendations for Other Services       Precautions / Restrictions Precautions Precautions: Fall;Other (comment) Precaution Comments: cortrak, watch BP, trach collar Restrictions Weight Bearing Restrictions: No    Mobility  Bed Mobility Overal bed mobility: Needs Assistance Bed Mobility: Supine to Sit     Supine to sit: Mod assist;+2 for physical assistance;HOB elevated     General bed mobility comments: multimodal cuing for hand placement and assist for B LEs and trunk to EOB  Transfers Overall transfer level: Needs assistance Equipment used: Rolling walker (2 wheeled);2 person hand held assist Transfers: Sit to/from [facility] Sit to Stand: Min assist;+2 physical assistance Stand pivot transfers: Mod assist;+2 safety/equipment       General transfer comment: min of 2 for standing but R lateral lean with ambulating requiring mod of 2 for safety. Attempted RW but pt was unable to utilize safely.  Ambulation/Gait                 Stairs             Wheelchair Mobility    Modified Rankin (Stroke Patients Only) Modified Rankin (Stroke Patients Only) Pre-Morbid Rankin Score: No symptoms Modified Rankin: Severe disability     Balance Overall balance assessment: Needs  assistance Sitting-balance support: Feet supported;Bilateral upper extremity supported Sitting balance-Leahy Scale: Fair Sitting balance - Comments: close supervision - min A for static sitting balance Postural control: Right lateral lean Standing balance support: Bilateral upper extremity supported Standing balance-Leahy Scale: Poor Standing balance comment: able to stand without knees buckling but R lateral lean with stand pivot transfer                            Cognition Arousal/Alertness: Awake/alert Behavior During Therapy: Flat affect Overall Cognitive Status: Difficult to assess                                 General Comments: Mildly impulsive, needing safety cues. Following simple commands.      Exercises      General Comments General comments (skin integrity, edema, etc.): BP 140s/70s supine and sitting and HR up to 120s with mobility      Pertinent Vitals/Pain Pain Assessment: Faces Faces Pain Scale: Hurts a little bit Pain Location: Both arms Pain Descriptors / Indicators: Discomfort Pain Intervention(s): Limited activity within patient's tolerance;Monitored during session;Repositioned    Home Living                      Prior Function            PT Goals (current goals can now be found in the care plan section) Progress towards PT goals: Progressing toward goals  Frequency    Min 3X/week      PT Plan Current plan remains appropriate    Co-evaluation PT/OT/SLP Co-Evaluation/Treatment: Yes Reason for Co-Treatment: Complexity of the patient's impairments (multi-system involvement);For patient/therapist safety;Necessary to address cognition/behavior during functional activity;To address functional/ADL transfers PT goals addressed during session: Mobility/safety with mobility;Balance OT goals addressed during session: ADL's and self-care;Strengthening/ROM;Proper use of Adaptive equipment and DME      AM-PAC PT  "6 Clicks" Mobility   Outcome Measure  Help needed turning from your back to your side while in a flat bed without using bedrails?: A Lot Help needed moving from lying on your back to sitting on the side of a flat bed without using bedrails?: A Lot Help needed moving to and from a bed to a chair (including a wheelchair)?: A Lot Help needed standing up from a chair using your arms (e.g., wheelchair or bedside chair)?: A Lot Help needed to walk in hospital room?: Total Help needed climbing 3-5 steps with a railing? : Total 6 Click Score: 10    End of Session Equipment Utilized During Treatment: Gait belt;Other (comment)(trach collar, 28% Fi02 6L/min 02) Activity Tolerance: Patient tolerated treatment well Patient left: in chair;with call bell/phone within reach;with chair alarm set Nurse Communication: Mobility status PT Visit Diagnosis: Unsteadiness on feet (R26.81);Hemiplegia and hemiparesis Hemiplegia - Right/Left: Left Hemiplegia - dominant/non-dominant: Non-dominant Hemiplegia - caused by: Cerebral infarction     Time: [id number] PT Time Calculation (min) (ACUTE ONLY): 28 min  Charges:  $Gait Training: 8-22 mins                     Erline LevineKellyn Elaine Middleton, PTA Acute Rehabilitation Services Pager: [phone] Office: [phone]     Carolynne EdouardKellyn R Abhijay Morriss 06/14/2019, 4:38 PM

## 2019-06-14 NOTE — Progress Notes (Signed)
PROGRESS NOTE    Michael Bright  ZWC:[id number] DOB: 1955/12/04 DOA: 05/30/2019 PCP: Patient, No Pcp Per    Brief Narrative:  63 year old male who presented with left leg weakness and left facial droop.  Patient does have history of type 2 diabetes mellitus, who has been noncompliant with his medical therapy.  He developed acute left leg weakness and numbness, about 3 days prior to hospitalization.  Physical examination his blood pressure was 112/73, heart rate 90, respiratory 23, oxygen saturation 89%.  His lungs were clear to auscultation bilaterally, heart S1-S2 present and rhythm, abdomen soft, no extremity edema.  Patient was awake and alert.  His left leg was noted to be weak.  Head CT was negative for acute changes.  Brain MRI showed small acute infarct in the lateral left medulla.  Chronic small vessel ischemia in the cerebral white matter and pons. EKG had 118 bpm, normal axis, normal intervals, sinus rhythm, no ST segment or T wave changes.  Patient was admitted to the hospital with a working diagnosis of acute ischemic cerebrovascular infarct.  During his hospitalization he developed aspiration pneumonia complicated by acute hypoxic respiratory failure that required intubation and invasive mechanical ventilation.  Patient intubated on August 15, unable to liberate from mechanical ventilation, then underwent tracheostomy August 21.  Transferred to [facility] 08/26.   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 Acute hypoxic respiratory failure due to aspiration pneumonia/ mssa. Patient this am continue to have copious secretions per trach, his chest film personally reviewed, no infiltrates. Follow up cultures with no growth. Patient has been treated with Unasyn #2 days and Cefazolin  from 08/17 to 08/20. Respiratory culture has been positive for MSSA. Will continue to follow up on cultures, will change back to cefazolin IV. Add glycopyrrolate for secretions. Continue trach care. Aspiration precautions, feedings per NG tube, follow with speech therapy.   2. 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 Patient with diffuse intracranial atherosclerosis. Continue antiplatelet therapy with aspirin and clopidogrel, for 3 months and then continue aspirin alone.   3. HTN. Continue blood pressure monitoring, systolic 235 to 361 today.   4. T2DM. Am glucose this am 189 mg/dl. Continue glucose cover and monitoring with insulin sliding scale, plus 20 units of insulin glargine. Patient is tolerating tube feedings.   5. Dyslipidemia. Continue with statin therapy.     DVT prophylaxis: enoxaparin   Code Status: full Family Communication: no family at the bedside  Disposition Plan/ discharge barriers: pending clinical improvement.   Body mass index is 43 kg/m. Malnutrition Type:  Nutrition Problem: Inadequate oral intake Etiology: dysphagia   Malnutrition Characteristics:  Signs/Symptoms: NPO status   Nutrition Interventions:  Interventions: Tube feeding  RN Pressure Injury Documentation:     Consultants:   Neurology   Pulmonary   Procedures:   Trach 08/21  Antimicrobials:    Cefazolin.    Subjective: This am with copious secretions per trach, positive nausea and cough but not vomiting. No chest pain, continue to have bilateral mittens.   Objective: Vitals:   06/14/19 0900 06/14/19 1000 06/14/19 1100 06/14/19 1200  BP:  138/74 (!) 175/80 (!) 165/74  Pulse:    100  Resp:  (!) 25 (!) 23 20  Temp:  99.3 F (37.4 C)  TempSrc: Axillary     SpO2:  99% 100% 100%  Weight:      Height:        Intake/Output Summary (Last 24 hours) at 06/14/2019 1213 Last data filed at 06/14/2019 1209 Gross per 24 hour  Intake 8106.36 ml  Output  800 ml  Net 7306.36 ml   Filed Weights   06/12/19 0500 06/13/19 0353 06/14/19 0459  Weight: (!) 139.3 kg (!) 138.5 kg (!) 143.8 kg    Examination:   General: Not in pain or dyspnea, deconditioned  Neurology: Awake and alert, following commands, not able to talk due to trach.,  E ENT: no pallor, no icterus, oral mucosa moist. NG tube in place. Trach in place.  Cardiovascular: No JVD. S1-S2 present, rhythmic, no gallops, rubs, or murmurs. Trace lower extremity edema. Pulmonary: Positive breath sounds bilaterally, no wheezing, scattered rhonchi bilaterally with no rales. Gastrointestinal. Abdomen protuberant, no organomegaly, non tender, no rebound or guarding Skin. No rashes Musculoskeletal: no joint deformities     Data Reviewed: I have personally reviewed following labs and imaging studies  CBC: Recent Labs  Lab 06/10/19 1625 06/11/19 0514 06/12/19 0131 06/12/19 0547 06/13/19 0341 06/14/19 0405  WBC 12.2* 12.6*  --  14.5* 15.5* 14.8*  HGB 12.6* 12.9* 12.6* 11.9* 13.3 12.2*  HCT 38.1* 39.5 37.0* 36.9* 40.9 38.0*  MCV 89.9 91.6  --  92.3 93.0 94.8  PLT 250 287  --  314 353 902   Basic Metabolic Panel: Recent Labs  Lab 06/08/19 0510 06/09/19 0506 06/10/19 1625 06/11/19 0514 06/12/19 0131 06/12/19 0547  NA 140 139 140 139 143 141  K 3.5 4.2 4.0 3.8 3.8 4.0  CL 106 110 109 107  --  107  CO2 _0 --  26  GLUCOSE 148* 191* 151* 199*  --  174*  BUN _1 --  18  CREATININE 0.86 0.83 0.77 0.81  --  0.83  CALCIUM 8.3* 8.5* 8.4* 8.4*  --  8.4*  MG  --   --   --   --   --  2.3  PHOS  --  3.6 3.8 3.1  --   --    GFR: Estimated Creatinine Clearance: 135.9 mL/min (by C-G formula based on SCr of 0.83 mg/dL). Liver Function Tests: Recent Labs  Lab 06/09/19 0506 06/10/19 1625 06/11/19 0514  ALBUMIN 2.3* 2.2* 2.2*   No results for input(s): LIPASE, AMYLASE in the last 168 hours. No results for input(s): AMMONIA in the last 168 hours. Coagulation  Profile: Recent Labs  Lab 06/09/19 0506  INR 1.1   Cardiac Enzymes: No results for input(s): CKTOTAL, CKMB, CKMBINDEX, TROPONINI in the last 168 hours. BNP (last 3 results) No results for input(s): PROBNP in the last 8760 hours. HbA1C: No results for input(s): HGBA1C in the last 72 hours. CBG: Recent Labs  Lab 06/13/19 2012 06/13/19 2359 06/14/19 0416 06/14/19 0759 06/14/19 1043  GLUCAP 217* 214* 189* 207* 218*   Lipid Profile: No results for input(s): CHOL, HDL, LDLCALC, TRIG, CHOLHDL, LDLDIRECT in the last 72 hours. Thyroid Function Tests: No results for input(s): TSH, T4TOTAL, FREET4, T3FREE, THYROIDAB in the last 72 hours. Anemia Panel: No results for input(s): VITAMINB12, FOLATE, FERRITIN, TIBC, IRON, RETICCTPCT in the last 72 hours.    Radiology Studies: I have reviewed all of the imaging during this hospital visit personally     Scheduled Meds: . aspirin  81 mg Per Tube Daily  . atorvastatin  40 mg Per Tube q1800  . chlorhexidine gluconate (MEDLINE KIT)  15 mL Mouth Rinse BID  . Chlorhexidine Gluconate Cloth  6 each Topical Daily  . clopidogrel  75 mg Per Tube Daily  . enoxaparin (LOVENOX) injection  70 mg Subcutaneous Daily  . famotidine  20 mg Per Tube BID  . feeding supplement (PRO-STAT SUGAR FREE 64)  60 mL Per Tube TID  . fentaNYL (SUBLIMAZE) injection  200 mcg Intravenous Once  . finasteride  5 mg Oral Daily  . insulin aspart  0-15 Units Subcutaneous Q4H  . insulin glargine  20 Units Subcutaneous Daily  . mouth rinse  15 mL Mouth Rinse 10 times per day  . propofol  500 mg Intravenous Once  . QUEtiapine  100 mg Per Tube QHS  . QUEtiapine  50 mg Per Tube Daily  . scopolamine  1 patch Transdermal Q72H  . sodium chloride flush  10-40 mL Intracatheter Q12H   Continuous Infusions: . sodium chloride 10 mL/hr at 06/14/19 1209  . feeding supplement (VITAL AF 1.2 CAL) 40 mL/hr at 06/13/19 0900  . piperacillin-tazobactam (ZOSYN)  IV Stopped (06/14/19  0708)  . vancomycin 250 mL/hr at 06/14/19 1209     LOS: 15 days        Modelle Vollmer Gerome Apley, MD

## 2019-06-14 NOTE — Progress Notes (Signed)
Inpatient Diabetes Program Recommendations  AACE/ADA: New Consensus Statement on Inpatient Glycemic Control (2015)  Target Ranges:  Prepandial:   less than 140 mg/dL      Peak postprandial:   less than 180 mg/dL (1-2 hours)      Critically ill patients:  140 - 180 mg/dL   Lab Results  Component Value Date   GLUCAP 218 (H) 06/14/2019   HGBA1C 10.6 (H) 05/30/2019    Review of Glycemic Control Results for [name], [name] (MRN [id number]) as of 06/14/2019 11:11  Ref. Range 06/13/2019 23:59 06/14/2019 04:16 06/14/2019 07:59 06/14/2019 10:43  Glucose-Capillary Latest Ref Range: 70 - 99 mg/dL 214 (H) 189 (H) 207 (H) 218 (H)   Diabetes history: no history, new onset DM Outpatient Diabetes medications: N/a Current orders for Inpatient glycemic control: Novolog 0-15 units Q4H, Lantus 20 units QD Vital 1.2 Cal 40 ml/hr  Inpatient Diabetes Program Recommendations:     Insulin-Tube Feeding Coverage: Please consider ordering Novolog 3 units Q4H for tube feeding coverage. If tube feeding is stopped or held, then Novolog tube feeding coverage should be stopped or held.  Thanks, Bronson Curb, MSN, RNC-OB Diabetes Coordinator [phone] (8a-5p)

## 2019-06-15 ENCOUNTER — Inpatient Hospital Stay (HOSPITAL_COMMUNITY): Payer: BC Managed Care – PPO

## 2019-06-15 DIAGNOSIS — E785 Hyperlipidemia, unspecified: Secondary | ICD-10-CM

## 2019-06-15 DIAGNOSIS — E1169 Type 2 diabetes mellitus with other specified complication: Secondary | ICD-10-CM

## 2019-06-15 LAB — CBC WITH DIFFERENTIAL/PLATELET
Abs Immature Granulocytes: 0.07 10*3/uL (ref 0.00–0.07)
Basophils Absolute: 0.1 10*3/uL (ref 0.0–0.1)
Basophils Relative: 1 %
Eosinophils Absolute: 0.1 10*3/uL (ref 0.0–0.5)
Eosinophils Relative: 1 %
HCT: 41.1 % (ref 39.0–52.0)
Hemoglobin: 12.9 g/dL — ABNORMAL LOW (ref 13.0–17.0)
Immature Granulocytes: 1 %
Lymphocytes Relative: 27 %
Lymphs Abs: 3.1 10*3/uL (ref 0.7–4.0)
MCH: 30.4 pg (ref 26.0–34.0)
MCHC: 31.4 g/dL (ref 30.0–36.0)
MCV: 96.9 fL (ref 80.0–100.0)
Monocytes Absolute: 1 10*3/uL (ref 0.1–1.0)
Monocytes Relative: 9 %
Neutro Abs: 7.3 10*3/uL (ref 1.7–7.7)
Neutrophils Relative %: 61 %
Platelets: 402 10*3/uL — ABNORMAL HIGH (ref 150–400)
RBC: 4.24 MIL/uL (ref 4.22–5.81)
RDW: 12.3 % (ref 11.5–15.5)
WBC: 11.6 10*3/uL — ABNORMAL HIGH (ref 4.0–10.5)
nRBC: 0 % (ref 0.0–0.2)

## 2019-06-15 LAB — CULTURE, RESPIRATORY W GRAM STAIN: Culture: NORMAL

## 2019-06-15 LAB — GLUCOSE, CAPILLARY
Glucose-Capillary: 145 mg/dL — ABNORMAL HIGH (ref 70–99)
Glucose-Capillary: 166 mg/dL — ABNORMAL HIGH (ref 70–99)
Glucose-Capillary: 176 mg/dL — ABNORMAL HIGH (ref 70–99)
Glucose-Capillary: 178 mg/dL — ABNORMAL HIGH (ref 70–99)
Glucose-Capillary: 194 mg/dL — ABNORMAL HIGH (ref 70–99)
Glucose-Capillary: 209 mg/dL — ABNORMAL HIGH (ref 70–99)
Glucose-Capillary: 228 mg/dL — ABNORMAL HIGH (ref 70–99)
Glucose-Capillary: 275 mg/dL — ABNORMAL HIGH (ref 70–99)

## 2019-06-15 LAB — BASIC METABOLIC PANEL
Anion gap: 11 (ref 5–15)
BUN: 27 mg/dL — ABNORMAL HIGH (ref 8–23)
CO2: 26 mmol/L (ref 22–32)
Calcium: 8.7 mg/dL — ABNORMAL LOW (ref 8.9–10.3)
Chloride: 107 mmol/L (ref 98–111)
Creatinine, Ser: 0.88 mg/dL (ref 0.61–1.24)
GFR calc Af Amer: >60 mL/min (ref >60)
GFR calc non Af Amer: >60 mL/min (ref >60)
Glucose, Bld: 213 mg/dL — ABNORMAL HIGH (ref 70–99)
Potassium: 3.5 mmol/L (ref 3.5–5.1)
Sodium: 144 mmol/L (ref 135–145)

## 2019-06-15 NOTE — Progress Notes (Signed)
Inpatient Diabetes Program Recommendations  AACE/ADA: New Consensus Statement on Inpatient Glycemic Control (2015)  Target Ranges:  Prepandial:   less than 140 mg/dL      Peak postprandial:   less than 180 mg/dL (1-2 hours)      Critically ill patients:  140 - 180 mg/dL   Lab Results  Component Value Date   GLUCAP 194 (H) 06/15/2019   HGBA1C 10.6 (H) 05/30/2019    Review of Glycemic Control Results for JHAN, CONERY (MRN [id number]) as of 06/15/2019 11:15  Ref. Range 06/14/2019 20:58 06/15/2019 00:26 06/15/2019 04:05 06/15/2019 07:43  Glucose-Capillary Latest Ref Range: 70 - 99 mg/dL 207 (H) 275 (H) 176 (H) 194 (H)   Diabetes history: no history, new onset DM Outpatient Diabetes medications: N/a Current orders for Inpatient glycemic control: Novolog 0-15 units Q4H, Lantus 20 units QD Vital 1.2 Cal 40 ml/hr  Inpatient Diabetes Program Recommendations:    Insulin-Tube Feeding Coverage: Please consider ordering Novolog 3 units Q4H for tube feeding coverage. If tube feeding is stopped or held, then Novolog tube feeding coverage should be stopped or held.  Thanks, Bronson Curb, MSN, RNC-OB Diabetes Coordinator [phone] (8a-5p)

## 2019-06-15 NOTE — Progress Notes (Signed)
Inpatient Rehabilitation-Admissions Coordinator   Discussed readiness with Dr. Cathlean Sauer. He anticipates pt would be ready for CIR Monday. Will continue to follow.   Jhonnie Garner, OTR/L  Rehab Admissions Coordinator  [phone] 06/15/2019 11:32 AM

## 2019-06-15 NOTE — Plan of Care (Signed)
Progressing towards goals

## 2019-06-15 NOTE — Progress Notes (Signed)
  Speech Language Pathology Treatment: [name] Speaking valve  Patient Details Name: Michael Bright MRN: [id number] DOB: 26-Oct-1955 Today's Date: 06/15/2019 Time: 7829-5621 SLP Time Calculation (min) (ACUTE ONLY): 10 min  Assessment / Plan / Recommendation Clinical Impression  Pt is limited in his ability to wear PMV for more than a few seconds at a time, suspect related to secretions and also possible trach size (still with cuffed trach). His secretions are a little thick and come fairly continuously, although not in large volumes at a time. He expectorates them well via trach, but is not able to produce any orally with PMV donned despite attempts. He is grossly aphonic, although with a few instances in which glottal closure was heard. Phonation was <1 second in duration though. Recommend continuing use with SLP only at this time. Will continue to follow for PMV trials. Note that swallow evaluation was reordered after pt was seen this morning - will f/u for readiness to complete reassessment as well.   HPI HPI: Michael Bright is a 63 y.o. male with hx of myotonic dystrophy 2 (DM2) and diabetes who presented with difficulty swallowing. Pt was transferred to ICU 8/11 for acute hypoxic respiratory failure most likely 2/2 aspiration in setting of stroke, resulting in aspiration PNA. L side hemiparesis, and diplopia. 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 Evaluated for dysphagia 8/12 and cog/ling with diagnoses of severe oropharyngeal dysphagia (NPO) and mod dysarthria. 8/14 Intubated 8/15, evaluated by SLP 8/19 with recommendation for trach. Trach placed 8/21 with SLP recommendation for PMV same day. Experienced worsening paranoia and delirium 8/23. Pt on tube feed, experiencing nausa and emesis 8/24.         SLP Plan  Continue with current plan of care       Recommendations         Patient may use Passy-Muir Speech Valve: with SLP only MD: Please consider changing trach tube to : Cuffless          Follow up Recommendations: Inpatient Rehab SLP Visit Diagnosis: Aphonia (R49.1) Plan: Continue with current plan of care       GO                Venita Sheffield Aariyana Manz 06/15/2019, 11:56 AM  Pollyann Glen, M.A. [place] Acute Environmental education officer [phone] Office [phone]

## 2019-06-15 NOTE — Progress Notes (Signed)
PROGRESS NOTE    Michael Bright  TOI:[id number] DOB: 03/20/1956 DOA: 05/30/2019 PCP: Patient, No Pcp Per    Brief Narrative:  63 year old male who presented with left leg weakness and left facial droop.  Patient does have history of type 2 diabetes mellitus, who has been noncompliant with his medical therapy.  He developed acute left leg weakness and numbness, about 3 days prior to hospitalization.  Physical examination his blood pressure was 112/73, heart rate 90, respiratory 23, oxygen saturation 89%.  His lungs were clear to auscultation bilaterally, heart S1-S2 present and rhythm, abdomen soft, no extremity edema.  Patient was awake and alert.  His left leg was noted to be weak.  Head CT was negative for acute changes.  Brain MRI showed small acute infarct in the lateral left medulla.  Chronic small vessel ischemia in the cerebral white matter and pons. EKG had 118 bpm, normal axis, normal intervals, sinus rhythm, no ST segment or T wave changes.  Patient was admitted to the hospital with a working diagnosis of acute ischemic cerebrovascular infarct.  During his hospitalization he developed aspiration pneumonia complicated by acute hypoxic respiratory failure that required intubation and invasive mechanical ventilation.  Patient intubated on August 15, unable to liberate from mechanical ventilation, then underwent tracheostomy August 21.  Transferred to [facility] 08/26.   Patient continue to have trach secretions.   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 Acute hypoxic respiratory failure due to aspiration pneumonia/ mssa. Moderate improvement in trach secretions, but continue to be  important in quantity. Follow up chest film personally reviewed, with hypoinflation but no infiltrates. WBC down to 11,6. Will continue antibiotic therapy with cefazolin for complete 8 days, for tracheobronchitis. Will add chest PT and continue speech therapy. Aspiration precautions. Oxymetry monitoring.   2. 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 Patient with diffuse intracranial atherosclerosis. On antiplatelet therapy with aspirin and clopidogrel, to continue for 3 months and then continue aspirin alone. Continue statin therapy. Patient will need a swallow evaluation, in order to remove NG tube.   3. HTN. Blood pressure has been controlled.    4. T2DM. Glucose cover and monitoring with insulin sliding scale, plus 20 units of insulin glargine. Am glucose 213 today.   5. Dyslipidemia. On statin therapy.     DVT prophylaxis: enoxaparin   Code Status: full Family Communication: no family at the bedside  Disposition Plan/ discharge barriers: pending clinical improvement   Body mass index is 43 kg/m. Malnutrition Type:  Nutrition Problem: Inadequate oral intake Etiology: dysphagia   Malnutrition Characteristics:  Signs/Symptoms: NPO status   Nutrition Interventions:  Interventions: Tube feeding  RN Pressure Injury Documentation:     Consultants:     Procedures:   Trach   Antimicrobials:   Cefazolin     Subjective: Patient is out of bed to chair, secretions have improved but continue to be persistent, continue to have ng tube and has been tolerating tube feedings well. Mittens have been removed. No agitation.   Objective: Vitals:   06/15/19 1300 06/15/19 1400 06/15/19 1506 06/15/19 1528  BP:   (!) 116/59 (!) 116/59  Pulse:   96 86  Resp: 20 20  20 (!) 22  Temp:      TempSrc:      SpO2:   94% 95%  Weight:      Height:        Intake/Output Summary (Last 24 hours) at 06/15/2019 1601 Last data filed at 06/15/2019 0900 Gross  per 24 hour  Intake 371.89 ml  Output 500 ml  Net -128.11 ml   Filed Weights   06/13/19 0353 06/14/19 0459 06/15/19 0409  Weight: (!) 138.5 kg (!) 143.8 kg (!) 143.8 kg    Examination:   General: deconditioned  Neurology: Awake and alert, non focal  E ENT: no pallor, no icterus, oral mucosa moist/ ng tube in place/ trach in place.  Cardiovascular: No JVD. S1-S2 present, rhythmic, no gallops, rubs, or murmurs. Trace lower extremity edema. Pulmonary: positive breath sounds bilaterally,  no wheezing, positive proximal rhonchi but no rales. Gastrointestinal. Abdomen with no organomegaly, non tender, no rebound or guarding Skin. No rashes Musculoskeletal: no joint deformities     Data Reviewed: I have personally reviewed following labs and imaging studies  CBC: Recent Labs  Lab 06/11/19 0514 06/12/19 0131 06/12/19 0547 06/13/19 0341 06/14/19 0405 06/15/19 0420  WBC 12.6*  --  14.5* 15.5* 14.8* 11.6*  NEUTROABS  --   --   --   --   --  7.3  HGB 12.9* 12.6* 11.9* 13.3 12.2* 12.9*  HCT 39.5 37.0* 36.9* 40.9 38.0* 41.1  MCV 91.6  --  92.3 93.0 94.8 96.9  PLT 287  --  314 353 361 876*   Basic Metabolic Panel: Recent Labs  Lab 06/09/19 0506 06/10/19 1625 06/11/19 0514 06/12/19 0131 06/12/19 0547 06/15/19 0420  NA 139 140 139 143 141 144  K 4.2 4.0 3.8 3.8 4.0 3.5  CL 110 109 107  --  107 107  CO2 '22 24 24  '$ --  26 26  GLUCOSE 191* 151* 199*  --  174* 213*  BUN '18 15 21  '$ --  18 27*  CREATININE 0.83 0.77 0.81  --  0.83 0.88  CALCIUM 8.5* 8.4* 8.4*  --  8.4* 8.7*  MG  --   --   --   --  2.3  --   PHOS 3.6 3.8 3.1  --   --   --    GFR: Estimated Creatinine Clearance: 128.2 mL/min (by C-G formula based on SCr of 0.88 mg/dL). Liver Function Tests: Recent Labs  Lab 06/09/19 0506 06/10/19 1625 06/11/19 0514  ALBUMIN 2.3* 2.2* 2.2*   No results for input(s): LIPASE, AMYLASE in the last 168 hours. No results for input(s): AMMONIA in the last 168 hours. Coagulation  Profile: Recent Labs  Lab 06/09/19 0506  INR 1.1   Cardiac Enzymes: No results for input(s): CKTOTAL, CKMB, CKMBINDEX, TROPONINI in the last 168 hours. BNP (last 3 results) No results for input(s): PROBNP in the last 8760 hours. HbA1C: No results for input(s): HGBA1C in the last 72 hours. CBG: Recent Labs  Lab 06/14/19 2058 06/15/19 0026 06/15/19 0405 06/15/19 0743 06/15/19 1207  GLUCAP 207* 275* 176* 194* 228*   Lipid Profile: No results for input(s): CHOL, HDL, LDLCALC, TRIG, CHOLHDL, LDLDIRECT in the last 72 hours. Thyroid Function Tests: No results for input(s): TSH, T4TOTAL, FREET4, T3FREE, THYROIDAB in the last 72 hours. Anemia Panel: No results for input(s): VITAMINB12, FOLATE, FERRITIN, TIBC, IRON, RETICCTPCT in the last 72 hours.    Radiology Studies: I have reviewed all of the imaging during this hospital visit personally  Scheduled Meds:  aspirin  81 mg Per Tube Daily   atorvastatin  40 mg Per Tube q1800   chlorhexidine gluconate (MEDLINE KIT)  15 mL Mouth Rinse BID   Chlorhexidine Gluconate Cloth  6 each Topical Daily   clopidogrel  75 mg Per Tube Daily   enoxaparin (LOVENOX) injection  70 mg Subcutaneous Daily   famotidine  20 mg Per Tube BID   feeding supplement (PRO-STAT SUGAR FREE 64)  60 mL Per Tube TID   finasteride  5 mg Oral Daily   glycopyrrolate  1 mg Per Tube BID   insulin aspart  0-15 Units Subcutaneous Q4H   insulin glargine  20 Units Subcutaneous Daily   mouth rinse  15 mL Mouth Rinse 10 times per day   QUEtiapine  100 mg Per Tube QHS   QUEtiapine  50 mg Per Tube Daily   scopolamine  1 patch Transdermal Q72H   sodium chloride flush  10-40 mL Intracatheter Q12H   Continuous Infusions:  sodium chloride 10 mL/hr at 06/14/19 2222    ceFAZolin (ANCEF) IV 2 g (06/15/19 1417)   feeding supplement (VITAL AF 1.2 CAL) 1,000 mL (06/14/19 1736)     LOS: 16 days        Ewel Lona Gerome Apley, MD

## 2019-06-16 LAB — GLUCOSE, CAPILLARY
Glucose-Capillary: 153 mg/dL — ABNORMAL HIGH (ref 70–99)
Glucose-Capillary: 177 mg/dL — ABNORMAL HIGH (ref 70–99)
Glucose-Capillary: 179 mg/dL — ABNORMAL HIGH (ref 70–99)
Glucose-Capillary: 179 mg/dL — ABNORMAL HIGH (ref 70–99)
Glucose-Capillary: 180 mg/dL — ABNORMAL HIGH (ref 70–99)
Glucose-Capillary: 194 mg/dL — ABNORMAL HIGH (ref 70–99)

## 2019-06-16 LAB — BASIC METABOLIC PANEL
Anion gap: 10 (ref 5–15)
BUN: 24 mg/dL — ABNORMAL HIGH (ref 8–23)
CO2: 28 mmol/L (ref 22–32)
Calcium: 8.5 mg/dL — ABNORMAL LOW (ref 8.9–10.3)
Chloride: 106 mmol/L (ref 98–111)
Creatinine, Ser: 0.8 mg/dL (ref 0.61–1.24)
GFR calc Af Amer: >60 mL/min (ref >60)
GFR calc non Af Amer: >60 mL/min (ref >60)
Glucose, Bld: 206 mg/dL — ABNORMAL HIGH (ref 70–99)
Potassium: 3.8 mmol/L (ref 3.5–5.1)
Sodium: 144 mmol/L (ref 135–145)

## 2019-06-16 LAB — CBC WITH DIFFERENTIAL/PLATELET
Abs Immature Granulocytes: 0.05 10*3/uL (ref 0.00–0.07)
Basophils Absolute: 0 10*3/uL (ref 0.0–0.1)
Basophils Relative: 0 %
Eosinophils Absolute: 0.1 10*3/uL (ref 0.0–0.5)
Eosinophils Relative: 1 %
HCT: 37.8 % — ABNORMAL LOW (ref 39.0–52.0)
Hemoglobin: 12.1 g/dL — ABNORMAL LOW (ref 13.0–17.0)
Immature Granulocytes: 1 %
Lymphocytes Relative: 32 %
Lymphs Abs: 3.2 10*3/uL (ref 0.7–4.0)
MCH: 30.1 pg (ref 26.0–34.0)
MCHC: 32 g/dL (ref 30.0–36.0)
MCV: 94 fL (ref 80.0–100.0)
Monocytes Absolute: 0.7 10*3/uL (ref 0.1–1.0)
Monocytes Relative: 7 %
Neutro Abs: 6.1 10*3/uL (ref 1.7–7.7)
Neutrophils Relative %: 59 %
Platelets: 372 10*3/uL (ref 150–400)
RBC: 4.02 MIL/uL — ABNORMAL LOW (ref 4.22–5.81)
RDW: 12.2 % (ref 11.5–15.5)
WBC: 10.2 10*3/uL (ref 4.0–10.5)
nRBC: 0 % (ref 0.0–0.2)

## 2019-06-16 MED ORDER — INSULIN STARTER KIT- PEN NEEDLES (ENGLISH)
1.0000 | Freq: Once | Status: DC
  Filled 2019-06-16 (×2): qty 1

## 2019-06-16 MED ORDER — LIVING WELL WITH DIABETES BOOK
Freq: Once | Status: AC
  Administered 2019-06-16: 16:00:00
  Filled 2019-06-16: qty 1

## 2019-06-16 NOTE — Progress Notes (Signed)
  Speech Language Pathology Treatment: [name] Speaking valve  Patient Details Name: Michael Bright MRN: [id number] DOB: [birth year] Today's Date: 06/16/2019 Time: 4944-9675 SLP Time Calculation (min) (ACUTE ONLY): 8 min  Assessment / Plan / Recommendation Clinical Impression  Pt has persistent secretions, but is able to expectorate them orally today with placement of PMV. Immediately following clearance he can generate ~1 second of phonation, but he still cannot leave PMV in place for more than a few seconds. Quickly after he clears his secretions he is no longer able to phonate. Suspect upper airway patency is largely impacted by size of trach and presence of cuff, but secretions do seem to be contributing as well. Encouraged pt to continue to try to cough and eject secretions as much as he is able to do so. PMV should still be worn with SLP only, although he is very motivated to progress to wearing it more.   HPI HPI: Michael Bright is a 63 y.o. male with hx of myotonic dystrophy 2 (DM2) and diabetes who presented with difficulty swallowing. Pt was transferred to ICU 8/11 for acute hypoxic respiratory failure most likely 2/2 aspiration in setting of stroke, resulting in aspiration PNA. L side hemiparesis, and diplopia. 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 Evaluated for dysphagia 8/12 and cog/ling with diagnoses of severe oropharyngeal dysphagia (NPO) and mod dysarthria. 8/14 Intubated 8/15, evaluated by SLP 8/19 with recommendation for trach. Trach placed 8/21 with SLP recommendation for PMV same day. Experienced worsening paranoia and delirium 8/23. Pt on tube feed, experiencing nausa and emesis 8/24.         SLP Plan  Continue with current plan of care       Recommendations         Patient may use Passy-Muir Speech Valve: with SLP only         Oral Care Recommendations: Oral care QID Follow up Recommendations: Inpatient Rehab SLP Visit Diagnosis: Aphonia (R49.1) Plan: Continue  with current plan of care       GO                Venita Sheffield Takyia Sindt 06/16/2019, 12:03 PM  Pollyann Glen, M.A. [place] Acute Environmental education officer [phone] Office [phone]

## 2019-06-16 NOTE — Evaluation (Signed)
Clinical/Bedside Swallow Evaluation Patient Details  Name: Michael Bright MRN: [id number] Date of Birth: [birth year]  Today's Date: 06/16/2019 Time: SLP Start Time (ACUTE ONLY): 1053 SLP Stop Time (ACUTE ONLY): 1106 SLP Time Calculation (min) (ACUTE ONLY): 13 min  Past Medical History: History reviewed. No pertinent past medical history. Past Surgical History:  Past Surgical History:  Procedure Laterality Date  . CHOLECYSTECTOMY     HPI:  Michael Bright is a 63 y.o. male with hx of myotonic dystrophy 2 (DM2) and diabetes who presented with difficulty swallowing. Pt was transferred to ICU 8/11 for acute hypoxic respiratory failure most likely 2/2 aspiration in setting of stroke, resulting in aspiration PNA. L side hemiparesis, and diplopia. 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 Evaluated for dysphagia 8/12 and cog/ling with diagnoses of severe oropharyngeal dysphagia (NPO) and mod dysarthria. 8/14 Intubated 8/15, evaluated by SLP 8/19 with recommendation for trach. Trach placed 8/21 with SLP recommendation for PMV same day. Experienced worsening paranoia and delirium 8/23. Pt on tube feed, experiencing nausa and emesis 8/24.      Assessment / Plan / Recommendation Clinical Impression  Pt has a high risk for aspiration s/p medullary infarct, prolonged intubation, and now trach. He is not able  To tolerate PMV, and when vocal quality is heard it is hoarse, low in volume, and wet. Evaluation today focused primarily on secretion management with reduced ability to eject secretions orally (although improved from previous date) and initiate a swallow. Education about risk and recommendations provided. He will benefit from instrumental testing prior to initiating POs, but ideally would like to see if he could manage his secretions and wear PMV a little better before proceeding with this.  SLP Visit Diagnosis: Dysphagia, unspecified (R13.10)    Aspiration Risk  Severe aspiration risk    Diet  Recommendation NPO;Alternative means - temporary   Medication Administration: Via alternative means    Other  Recommendations Oral Care Recommendations: Oral care QID Other Recommendations: Have oral suction available   Follow up Recommendations Inpatient Rehab      Frequency and Duration min 2x/week  2 weeks       Prognosis Prognosis for Safe Diet Advancement: Good Barriers to Reach Goals: Severity of deficits      Swallow Study   General Date of Onset: 05/30/19 HPI: Michael Bright is a 63 y.o. male with hx of myotonic dystrophy 2 (DM2) and diabetes who presented with difficulty swallowing. Pt was transferred to ICU 8/11 for acute hypoxic respiratory failure most likely 2/2 aspiration in setting of stroke, resulting in aspiration PNA. L side hemiparesis, and diplopia. 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 Evaluated for dysphagia 8/12 and cog/ling with diagnoses of severe oropharyngeal dysphagia (NPO) and mod dysarthria. 8/14 Intubated 8/15, evaluated by SLP 8/19 with recommendation for trach. Trach placed 8/21 with SLP recommendation for PMV same day. Experienced worsening paranoia and delirium 8/23. Pt on tube feed, experiencing nausa and emesis 8/24.    Type of Study: Bedside Swallow Evaluation Previous Swallow Assessment: BSE 8/12 Diet Prior to this Study: NPO;NG Tube Temperature Spikes Noted: No Respiratory Status: Trach;Trach Collar Trach Size and Type: Cuff;#6;Deflated;With PMSV not in place History of Recent Intubation: Yes Length of Intubations (days): 7 days Date extubated: (trach 8/21) Behavior/Cognition: Alert;Cooperative;Pleasant mood Oral Cavity Assessment: Within Functional Limits Oral Care Completed by SLP: Yes Oral Cavity - Dentition: Adequate natural dentition Patient Positioning: Upright in bed Baseline Vocal Quality: Aphonic(PMV not in place ) Volitional Cough: Weak Volitional Swallow: Able to elicit(but  not consistently)    Oral/Motor/Sensory Function  Overall Oral Motor/Sensory Function: Within functional limits   Ice Chips Ice chips: Not tested   Thin Liquid Thin Liquid: Not tested    Nectar Thick Nectar Thick Liquid: Not tested   Honey Thick Honey Thick Liquid: Not tested   Puree Puree: Not tested   Solid     Solid: Not tested      Virl AxeLaura P Martine Trageser 06/16/2019,12:13 PM  Ivar DrapeLaura Wrigley Winborne, M.A. CCC-SLP Acute Herbalistehabilitation Services Pager [phone] Office [phone]

## 2019-06-16 NOTE — Plan of Care (Signed)
Progressing towards goals

## 2019-06-16 NOTE — PMR Pre-admission (Signed)
PMR Admission Coordinator Pre-Admission Assessment  Patient: Michael Bright is an 63 y.o., male MRN: [id number] DOB: 11/30/1955 Height: 6' (182.9 cm) Weight: (!) 141.7 kg  Insurance Information HMO:     PPO: yes     PCP:      IPA:      80/20:      OTHER:  PRIMARY: [employer] [place] with [employer]      Policy#: [id number]      Subscriber: Patient CM Name: Rochele Pages      Phone#: [phone]     Fax#: [phone] Pre-Cert#: [id number]      Employer:  Josem Kaufmann provided by Rochele Pages for CIR with approval dates 9/8-9/15 (up to 9/15 but not including 9/15). Clinical updates are due 9/14 to (f): [phone]; if questions (p): [phone] Benefits:  Phone #: [phone] and [email or web address] provider protal confirmation     Name: [email or web address] Eff. Date: 10/19/2018 - still active     Deduct: $200 ($200 met)      Out of Pocket Max: $500 ($500 met)      Life Max:  CIR: 80% coverage, 20% co-insurance; limited by medical necessity via prior auth      SNF: 80% coverage, 20% co-insurance; limited by medical necessity via prior auth  Outpatient:  80% coverage, 20% co-insurance; limited by medical necessity via prior Caroline: 80% coverage, 20% co-insurance; limited by medical necessity via prior auth  DME: 80% coverage, 20% co-insurance; limited by medical necessity via prior auth  Providers:  SECONDARY: Medicare Part A and B      Policy#: [id number]      Subscriber: Patient CM Name:       Phone#:      Fax#:  Pre-Cert#:       Employer:  Benefits:  Phone #:      Name: verified eligibility online via [employer] on 06/27/2019 Eff. Date:      Deduct:       Out of Pocket Max:       Life Max:  CIR:       SNF:  Outpatient:      Co-Pay:  Home Health:       Co-Pay:  DME:      Co-Pay:   Medicaid Application Date:       Case Manager:  Disability Application Date:       Case Worker:   The "Data Collection Information Summary" for patients in Inpatient Rehabilitation Facilities with attached "Privacy Act  Keller Records" was provided and verbally reviewed with: Patient and Family  Emergency Contact Information Contact Information    Name Relation Home Work Mobile   Klapper,[name]    [phone]      Current Medical History  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 Pt presented to the [facility] ED on 8/11 with LLE weakness, left facial droop, swallowing problems, left facial numbness and double vision. 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 Pt was showing increased dyspnea with new O2 requirement. Pt was found to have difficulty managing secretions and was tachypneic and tachycardic. Due to elevated risk for requiring intubation pt was transferred to the ICU on 8/12. Pt required Cortrak for nutrition. Pt was deemed stable and transferred off ICU on 8/14. By 8/15 pt required additional O2  needs, and was eventually intubated on 8/15 due to respiratory insufficiency. Pt was treated with Unasyn and Cefazolin. Pt underwent tracheostomy 8/21. Pt had issues with secretion management. Sputum growing Haemophilus influenza for which he is on Unasyn. Due to tachycardia, CT of the chest was obtained to rule out PE which ultimately showed b/l atelectasis and consolidation.  NG tube remains for nutrition. To be followed up with [facility].  Therapy evaluations completed and CIR has been recommended. Pt is to admit to CIR on 06/27/2019.   Complete NIHSS TOTAL: 1  Patient's medical record from [facility] has been reviewed by the rehabilitation admission coordinator and physician.  Past Medical History  History reviewed. No pertinent past medical history.  Family History   family history is not on file.  Prior Rehab/Hospitalizations Has the patient had prior rehab or  hospitalizations prior to admission? No  Has the patient had major surgery during 100 days prior to admission? No   Current Medications  Current Facility-Administered Medications:  .  acetaminophen (TYLENOL) tablet 650 mg, 650 mg, Oral, Q4H PRN, 650 mg at 06/17/19 1604 **OR** acetaminophen (TYLENOL) solution 650 mg, 650 mg, Per Tube, Q4H PRN, 650 mg at 06/24/19 1003 **OR** acetaminophen (TYLENOL) suppository 650 mg, 650 mg, Rectal, Q4H PRN, Minor, Grace Bushy, NP .  amLODipine (NORVASC) tablet 10 mg, 10 mg, Per Tube, Daily, Rizwan, Saima, MD, 10 mg at 06/27/19 3532 .  aspirin chewable tablet 81 mg, 81 mg, Per Tube, Daily, Agarwala, Ravi, MD, 81 mg at 06/27/19 0922 .  atorvastatin (LIPITOR) tablet 40 mg, 40 mg, Per Tube, q1800, Kipp Brood, MD, 40 mg at 06/26/19 1700 .  bisacodyl (DULCOLAX) suppository 10 mg, 10 mg, Rectal, Daily PRN, Minor, Grace Bushy, NP, 10 mg at 06/23/19 1055 .  budesonide (PULMICORT) nebulizer solution 0.25 mg, 0.25 mg, Nebulization, BID, Rizwan, Saima, MD, 0.25 mg at 06/27/19 0746 .  cefTRIAXone (ROCEPHIN) 2 g in sodium chloride 0.9 % 100 mL IVPB, 2 g, Intravenous, Q24H, Debbe Odea, MD, Stopped at 06/27/19 1116 .  chlorhexidine gluconate (MEDLINE KIT) (PERIDEX) 0.12 % solution 15 mL, 15 mL, Mouth Rinse, BID, Agarwala, Ravi, MD, 15 mL at 06/27/19 0800 .  Chlorhexidine Gluconate Cloth 2 % PADS 6 each, 6 each, Topical, Daily, Collene Gobble, MD, 6 each at 06/27/19 [phone] .  clopidogrel (PLAVIX) tablet 75 mg, 75 mg, Per Tube, Daily, Collene Gobble, MD, 75 mg at 06/27/19 2683 .  docusate (COLACE) 50 MG/5ML liquid 100 mg, 100 mg, Per Tube, BID PRN, Darlina Sicilian A, NP, 100 mg at 06/27/19 0935 .  enoxaparin (LOVENOX) injection 70 mg, 70 mg, Subcutaneous, Daily, Corey Harold, NP, 70 mg at 06/27/19 0921 .  famotidine (PEPCID) 40 MG/5ML suspension 20 mg, 20 mg, Per Tube, BID, Kipp Brood, MD, 20 mg at 06/27/19 0923 .  feeding supplement (OSMOLITE 1.2 CAL) liquid 1,000  mL, 1,000 mL, Per Tube, Continuous, Arrien, Jimmy Picket, MD, Last Rate: 60 mL/hr at 06/27/19 1044, 1,000 mL at 06/27/19 1044 .  feeding supplement (PRO-STAT SUGAR FREE 64) liquid 30 mL, 30 mL, Per Tube, TID, Arrien, Jimmy Picket, MD, 30 mL at 06/27/19 4196 .  finasteride (PROSCAR) tablet 5 mg, 5 mg, Oral, Daily, Arrien, Jimmy Picket, MD, 5 mg at 06/27/19 2229 .  glycopyrrolate (ROBINUL) tablet 2 mg, 2 mg, Per Tube, BID, Rizwan, Saima, MD, 2 mg at 06/27/19 7989 .  hydrALAZINE (APRESOLINE) injection 10-20 mg, 10-20 mg, Intravenous, Q6H PRN, Rosalin Hawking, MD .  ibuprofen (ADVIL)  tablet 400 mg, 400 mg, Per NG tube, Q6H PRN, Arrien, Jimmy Picket, MD, 400 mg at 06/18/19 0850 .  insulin aspart (novoLOG) injection 0-15 Units, 0-15 Units, Subcutaneous, Q4H, Deterding, Guadelupe Sabin, MD, 3 Units at 06/27/19 1154 .  insulin glargine (LANTUS) injection 25 Units, 25 Units, Subcutaneous, Daily, Debbe Odea, MD, 25 Units at 06/27/19 0921 .  insulin starter kit- pen needles (English) 1 kit, 1 kit, Other, Once, Arrien, Jimmy Picket, MD .  MEDLINE mouth rinse, 15 mL, Mouth Rinse, 10 times per day, Agarwala, Einar Grad, MD, 15 mL at 06/27/19 1348 .  ondansetron (ZOFRAN) injection 4 mg, 4 mg, Intravenous, Q6H PRN, Anders Simmonds, MD, 4 mg at 06/14/19 1104 .  QUEtiapine (SEROQUEL) tablet 100 mg, 100 mg, Per Tube, QHS, Julian Hy, DO, 100 mg at 06/26/19 2120 .  QUEtiapine (SEROQUEL) tablet 50 mg, 50 mg, Per Tube, Daily, Julian Hy, DO, 50 mg at 06/27/19 0962 .  scopolamine (TRANSDERM-SCOP) 1 MG/3DAYS 1.5 mg, 1 patch, Transdermal, Q72H, Rizwan, Saima, MD, 1.5 mg at 06/24/19 1711 .  senna-docusate (Senokot-S) tablet 1 tablet, 1 tablet, Per Tube, QHS PRN, Kipp Brood, MD, 1 tablet at 06/27/19 0922 .  sodium chloride flush (NS) 0.9 % injection 10-40 mL, 10-40 mL, Intracatheter, Q12H, Agarwala, Ravi, MD, 10 mL at 06/27/19 0922 .  sodium chloride flush (NS) 0.9 % injection 10-40 mL, 10-40 mL,  Intracatheter, PRN, Kipp Brood, MD  Patients Current Diet:  Diet Order            Diet NPO time specified  Diet effective midnight              Precautions / Restrictions Precautions Precautions: Fall, Other (comment) Precaution Comments: cortrak, watch BP, trach collar; PMV with therapies(note: could not find PMV in room) Restrictions Weight Bearing Restrictions: No   Has the patient had 2 or more falls or a fall with injury in the past year? No  Prior Activity Level Community (5-7x/wk): very active PTA; drove, ambulated without AD. Independent PTA  Prior Functional Level Self Care: Did the patient need help bathing, dressing, using the toilet or eating? Independent  Indoor Mobility: Did the patient need assistance with walking from room to room (with or without device)? Independent  Stairs: Did the patient need assistance with internal or external stairs (with or without device)? Independent  Functional Cognition: Did the patient need help planning regular tasks such as shopping or remembering to take medications? Independent  Home Assistive Devices / Equipment Home Assistive Devices/Equipment: None Home Equipment: Walker - 2 wheels, Tub bench  Prior Device Use: Indicate devices/aids used by the patient prior to current illness, exacerbation or injury? None of the above  Current Functional Level Cognition  Arousal/Alertness: Awake/alert Overall Cognitive Status: Impaired/Different from baseline Difficult to assess due to: Tracheostomy Orientation Level: Oriented X4 Safety/Judgement: Decreased awareness of safety, Decreased awareness of deficits General Comments: Pt following simple cues and demonstrating increased awareness for assistance needed with deficits. Applying education provided such as hand placement with transfers - require cues for first transfer and then applying techniques for second transfer Attention: Focused, Sustained Focused Attention: Appears  intact Sustained Attention: Appears intact Memory: Impaired Memory Impairment: Storage deficit, Retrieval deficit(Immediate: 3/3; delayed: 2/3 with cues: 1/1) Awareness: Appears intact Problem Solving: Appears intact(3/3) Executive Function: Reasoning Reasoning: Appears intact    Extremity Assessment (includes Sensation/Coordination)  Upper Extremity Assessment: LUE deficits/detail LUE Deficits / Details: WFL for strength. Decreased FM skills and finger opposition.  LUE Coordination:  decreased fine motor  Lower Extremity Assessment: Defer to PT evaluation LLE Deficits / Details: Test 4/5 but functionally weaker and diffculty supporting pt LLE Sensation: decreased light touch(Pt reports hx of numbness lateral leg due to back injury) LLE Coordination: decreased gross motor    ADLs  Overall ADL's : Needs assistance/impaired Eating/Feeding: NPO Eating/Feeding Details (indicate cue type and reason): coretrak Grooming: Wash/dry face, Moderate assistance, Standing Grooming Details (indicate cue type and reason): Pt requiring Mod A +2 for standing balance at sink to wash his face. Pt requiring left knee to be blocked due to weakness. Able to perform bilateral coorindation of UE for several seconds before returning hand to sink for support Upper Body Bathing: Set up, Sitting Lower Body Bathing: Moderate assistance, Sit to/from stand, Sitting/lateral leans Upper Body Dressing : Set up, Sitting Lower Body Dressing: Moderate assistance, Sit to/from stand, Sitting/lateral leans Toilet Transfer: +2 for physical assistance, Minimal assistance, Ambulation, RW(Simulated to recliner) Toilet Transfer Details (indicate cue type and reason): Min A +2 for gaining stanidng balance. Requiring Mod A +2 for mobility Toileting- Clothing Manipulation and Hygiene: Minimal assistance, +2 for physical assistance Toileting - Clothing Manipulation Details (indicate cue type and reason): sit<>stand and with use of  Denna Haggard Tub/ Shower Transfer: Moderate assistance, Shower seat, Rolling walker Functional mobility during ADLs: Moderate assistance, +2 for physical assistance, Rolling walker General ADL Comments: Pt demonstrating incrreased activity tolerance to mobility to sink with Mod A +2 and thne perform grooming task before taking seated rest break    Mobility  Overal bed mobility: Needs Assistance Bed Mobility: Supine to Sit Supine to sit: Min assist, HOB elevated Sit to supine: HOB elevated, Min guard General bed mobility comments: Min A for pulling with LUE at therpist's hand. Cues for scooting towards EOB    Transfers  Overall transfer level: Needs assistance Equipment used: Rolling walker (2 wheeled) Transfer via Lift Equipment: Stedy Transfers: Sit to/from Stand Sit to Stand: Min assist, +2 safety/equipment Stand pivot transfers: Mod assist, +2 safety/equipment General transfer comment: Min A +2 for gaining balance in standing. No needed assistance for power up. Cues for hand placement    Ambulation / Gait / Stairs / Wheelchair Mobility  Ambulation/Gait Ambulation/Gait assistance: +2 physical assistance, Mod assist Gait Distance (Feet): 16 Feet(6', 10') Assistive device: Rolling walker (2 wheeled) Gait Pattern/deviations: Decreased stance time - left, Decreased weight shift to left, Decreased step length - right, Decreased step length - left, Step-through pattern, Ataxic General Gait Details: pt's L knee intermittently hyperextending and buckling, compensating with increased wt through LUE  Gait velocity: decr Gait velocity interpretation: <1.31 ft/sec, indicative of household ambulator    Posture / Balance Dynamic Sitting Balance Sitting balance - Comments: supervision for sitting balance on EOB Balance Overall balance assessment: Needs assistance Sitting-balance support: Feet supported, Single extremity supported Sitting balance-Leahy Scale: Good Sitting balance - Comments:  supervision for sitting balance on EOB Postural control: Right lateral lean Standing balance support: No upper extremity supported, During functional activity, Bilateral upper extremity supported Standing balance-Leahy Scale: Poor Standing balance comment: Continues to require physical assistance to maintain standing balance with weakness at left knee    Special needs/care consideration BiPAP/CPAP: pt has CPAP at home but reports he has not used in awhile. Per critical care note on 8/26, he will require return to CPAP once decannulated.  CPM : no Continuous Drip IV : Ceftriaxone, feeding supplement Dialysis : no        Days : no Life Vest :  no Oxygen 5L/min Special Bed : no Trach Size : yes, #6  Wound Vac (area) : no      Location : no Skin : left arm ecchymosis, MASD to abdomen, groin                     Bowel mgmt: last BM: 06/23/2019, continent Bladder mgmt: incontinent, external catheter Diabetic mgmt: yes Behavioral consideration : no Chemo/radiation : no   Previous Home Environment (from acute therapy documentation) Living Arrangements: Spouse/significant other  Lives With: Spouse Available Help at Discharge: Family, Available 24 hours/day Type of Home: House Home Layout: Two level, Able to live on main level with bedroom/bathroom Alternate Level Stairs-Number of Steps: basement, but does not go in Home Access: Ramped entrance Bathroom Shower/Tub: Chiropodist: Handicapped height Home Care Services: No Additional Comments: handicap accessible home from parents that are now deceased  Discharge Living Setting Plans for Discharge Living Setting: Patient's home, Lives with (comment)(wife) Type of Home at Discharge: House Discharge Home Layout: Two level, Able to live on main level with bedroom/bathroom Alternate Level Stairs-Rails: None(NA basement) Alternate Level Stairs-Number of Steps: (NA, basement; pt does not ever use this access) Discharge Home  Access: Ramped entrance Discharge Bathroom Shower/Tub: Tub/shower unit Discharge Bathroom Toilet: Handicapped height Discharge Bathroom Accessibility: Yes How Accessible: Accessible via wheelchair, Accessible via walker Does the patient have any problems obtaining your medications?: Yes (Describe)(per pt, limitations include financial issues)  Social/Family/Support Systems Patient Roles: Spouse Contact Information: wife: Mliss Sax [phone]) Anticipated Caregiver: wife and son (88 yo son in evenings) (daughter lives nearby) Anticipated Ambulance person Information: see above Ability/Limitations of Caregiver: Min G for wife (lifiting restrictions <5 lbs) ; (son able to physically assist as able) Caregiver Availability: Other (Comment)(24/7 initally (FMLA by wife); then intermittant A) Discharge Plan Discussed with Primary Caregiver: Yes Is Caregiver In Agreement with Plan?: Yes Does Caregiver/Family have Issues with Lodging/Transportation while Pt is in Rehab?: No  Goals/Additional Needs Patient/Family Goal for Rehab: PT/OT: Supervision/Min A; SLP: Min A Expected length of stay: 16-20 days Cultural Considerations: NA Dietary Needs: NPO, cortrak currently Equipment Needs: TBD Pt/Family Agrees to Admission and willing to participate: Yes Program Orientation Provided & Reviewed with Pt/Caregiver Including Roles  & Responsibilities: Yes(pt and wife)  Barriers to Discharge: Lack of/limited family support, Insurance for SNF coverage, Nutrition means, New oxygen(new O2 needs; nutrition concerns, wife works (can take [employer] and plans to have son to assist at home))  Decrease burden of Care through IP rehab admission: NA  Possible need for SNF placement upon discharge: Not anticipated; pt has good social support as wife is able to take FMLA and anticipate pt has good prognosis for further progress through CIR. Pt has accessible home as needed.   Patient Condition: I have reviewed medical  records from [facility], spoken with RN, and patient and spouse. I met with patient at the bedside for inpatient rehabilitation assessment.  Patient will benefit from ongoing PT, OT and SLP, can actively participate in 3 hours of therapy a day 5 days of the week, and can make measurable gains during the admission.  Patient will also benefit from the coordinated team approach during an Inpatient Acute Rehabilitation admission.  The patient will receive intensive therapy as well as Rehabilitation physician, nursing, social worker, and care management interventions.  Due to bladder management, bowel management, safety, disease management, medication administration, pain management and patient education the patient requires 24 hour a day rehabilitation  nursing.  The patient is currently Mod A x2 with mobility and basic ADLs.  Discharge setting and therapy post discharge at home with home health is anticipated.  Patient has agreed to participate in the Acute Inpatient Rehabilitation Program and will admit 06/27/2019.  Preadmission Screen Completed By:  Jhonnie Garner, 06/27/2019 2:07 PM ______________________________________________________________________   Discussed status with Dr. Letta Pate on 06/27/2019 at 1:11 PM and received approval for admission today.  Admission Coordinator:  Jhonnie Garner, OT, time 1:11PM/Date 06/27/2019  Assessment/Plan: Diagnosis: Left Wallenberg syndrome 1. Does the need for close, 24 hr/day Medical supervision in concert with the patient's rehab needs make it unreasonable for this patient to be served in a less intensive setting? Yes 2. Co-Morbidities requiring supervision/potential complications: Dysphagia due to stroke n.p.o., respiratory failure, status post tracheostomy 3. Due to bladder management, bowel management, safety, skin/wound care, disease management, medication administration, pain management and patient education, does the patient require 24 hr/day rehab  nursing? Yes 4. Does the patient require coordinated care of a physician, rehab nurse, PT (1-2 hrs/day, 5 days/week), OT (1-2 hrs/day, 5 days/week) and SLP (.5-1 hrs/day, 5 days/week) to address physical and functional deficits in the context of the above medical diagnosis(es)? Yes Addressing deficits in the following areas: balance, endurance, locomotion, strength, transferring, bowel/bladder control, bathing, dressing, feeding, toileting, swallowing and psychosocial support 5. Can the patient actively participate in an intensive therapy program of at least 3 hrs of therapy 5 days a week? Yes 6. The potential for patient to make measurable gains while on inpatient rehab is good 7. Anticipated functional outcomes upon discharge from inpatients are: supervision and min assist PT, supervision and min assist OT, supervision and min assist SLP 8. Estimated rehab length of stay to reach the above functional goals is: 16 to 20 days 9. Anticipated D/C setting: Home 10. Anticipated post D/C treatments: [place] therapy 11. Overall Rehab/Functional Prognosis: good  MD Signature: Charlett Blake M.D. [facility] FAAPM&R (Sports Med, Neuromuscular Med) Diplomate Am Board of Electrodiagnostic Med

## 2019-06-16 NOTE — Progress Notes (Addendum)
Inpatient Diabetes Program Recommendations  AACE/ADA: New Consensus Statement on Inpatient Glycemic Control (2015)  Target Ranges:  Prepandial:   less than 140 mg/dL      Peak postprandial:   less than 180 mg/dL (1-2 hours)      Critically ill patients:  140 - 180 mg/dL   Lab Results  Component Value Date   GLUCAP 179 (H) 06/16/2019   HGBA1C 10.6 (H) 05/30/2019    Review of Glycemic Control Results for Michael Bright, Michael Bright (MRN [id number]) as of 06/16/2019 12:18  Ref. Range 06/15/2019 23:24 06/16/2019 04:05 06/16/2019 08:50 06/16/2019 11:33  Glucose-Capillary Latest Ref Range: 70 - 99 mg/dL 178 (H) 179 (H) 177 (H) 179 (H)   Diabetes history:Type 2 DM Outpatient Diabetes medications:N/a Current orders for Inpatient glycemic control:Novolog 0-15 units Q4H, Lantus 20 units QD Vital 1.2 Cal 40 ml/hr  Inpatient Diabetes Program Recommendations: Spoke with patient's RN and verified difficulty in communicating with patient for DM education. Reports that she has been using pictures to guide conversation.  Spoke with patient's wife by phone to discuss DM. Verified that patient was diagnosed in 2005 with Type 2 DM. In the past was prescribed Metformin and Insulin via insulin pens, however, quit taking medications due to frustrations with provider and did not follow up. Patient's wife states, "He got frustrated and is stubborn and there is only so much I can control."  Reviewed patient's current A1c of 10.6%. Explained what a A1c is and what it measures. Also reviewed goal A1c with patient, importance of good glucose control @ home, and blood sugar goals. Reviewed patho of DM, need for insulin, role of pancreas, signs and symptoms of hypo vs hyper glycemia, interventions, increased risk of further stroke, vascular changes and comorbidites.  Patient will need a meter at discharge. Blood glucose meter (includes lancets and strips) (#[id number]). Encouraged to begin thinking about schedule when patient is to  be discharged. Will need to be checking 2-4 times per day at a minimum. Reviewed when to call MD. Will also order LWWDM booklet. Discussed the need for patient to have close follow up with DM management and to find PCP. Will place CM consult and encouraged patient' wife to call the number on back of insurance card. Will also attach endo list to discharge summary.  Briefly reviewed insulin pen with wife. She also has diabetes and uses insulin pens. Has no further questions at this time. Discussed will need to determine dosages, pending tube feed plan, as discharge nears.   Thanks, Bronson Curb, MSN, RNC-OB Diabetes Coordinator [phone] (8a-5p)   Patient wa

## 2019-06-16 NOTE — Progress Notes (Signed)
RT NOTE: RT removed sutures from trach per protocol. Trach care done, trach ties changed and secure. Vitals are stable. RT will continue to monitor.

## 2019-06-16 NOTE — Discharge Instructions (Signed)
Local Endocrinologists [facility] [phone]) 1. Dr. Philemon Kingdom 2. Dr. Janie Morning [facility] [phone]) 1. Dr. Delrae Rend [facility] [phone]) 1. Dr. Jacelyn Pi 2. Dr. Anda Kraft [facility] [phone]) 1. Dr. Daneil Dolin [facility] [phone]) [[place] office]  [phone]) [[place] office] 1. Dr. Lenna Sciara Solum 2. Dr. Judithann Sheen [facility]) [phone]) 1. Autumn Hudnall Ronnald Ramp), PA 2. Dr. Amalia Greenhouse 3. Dr. Marsh Dolly. [facility] [phone]) 1. Dr. Glade Lloyd [facility] [phone]) 1. Dr. Orville Govern 2. Dr. Lelon Huh 3. Dr. Jerelene Redden 4. Alwyn Ren, FNP Dr. Carolynn Serve. [facility] in [place] [phone]) Hypoglycemia Hypoglycemia occurs when the level of sugar (glucose) in the blood is too low. Hypoglycemia can happen in people who do or do not have diabetes. It can develop quickly, and it can be a medical emergency. For most people with diabetes, a blood glucose level below 70 mg/dL (3.9 mmol/L) is considered hypoglycemia. Glucose is a type of sugar that provides the body's main source of energy. Certain hormones (insulin and glucagon) control the level of glucose in the blood. Insulin lowers blood glucose, and glucagon raises blood glucose. Hypoglycemia can result from having too much insulin in the bloodstream, or from not eating enough food that contains glucose. You may also have reactive hypoglycemia, which happens within 4 hours after eating a meal. What are the causes? Hypoglycemia occurs most often in people who have diabetes and may be caused by:  Diabetes medicine.  Not eating enough, or not eating often enough.  Increased physical activity.  Drinking alcohol on an empty stomach. If you do not have diabetes, hypoglycemia may be  caused by:  A tumor in the pancreas.  Not eating enough, or not eating for long periods at a time (fasting).  A severe infection or illness.  Certain medicines. What increases the risk? Hypoglycemia is more likely to develop in:  People who have diabetes and take medicines to lower blood glucose.  People who abuse alcohol.  People who have a severe illness. What are the signs or symptoms? Mild symptoms Mild hypoglycemia may not cause any symptoms. If you do have symptoms, they may include:  Hunger.  Anxiety.  Sweating and feeling clammy.  Dizziness or feeling light-headed.  Sleepiness.  Nausea.  Increased heart rate.  Headache.  Blurry vision.  Irritability.  Tingling or numbness around the mouth, lips, or tongue.  A change in coordination.  Restless sleep. Moderate symptoms Moderate hypoglycemia can cause:  Mental confusion and poor judgment.  Behavior changes.  Weakness.  Irregular heartbeat. Severe symptoms Severe hypoglycemia is a medical emergency. It can cause:  Fainting.  Seizures.  Loss of consciousness (coma).  Death. How is this diagnosed? Hypoglycemia is diagnosed with a blood test to measure your blood glucose level. This blood test is done while you are having symptoms. Your health care provider may also do a physical exam and review your medical history. How is this treated? This condition can often be treated by immediately eating or drinking something that contains sugar, such as:  Fruit juice, 4-6 oz (120-150 mL).  Regular soda (not diet soda), 4-6 oz (120-150 mL).  Low-fat milk, 4 oz (120 mL).  Several pieces of hard candy.  Sugar or honey, 1 Tbsp (15 mL). Treating hypoglycemia if you have diabetes If you are alert and able to swallow safely, follow the 15:15 rule:  Take 15 grams of a  rapid-acting carbohydrate. Talk with your health care provider about how much you should take.  Rapid-acting options  include: ? Glucose pills (take 15 grams). ? 6-8 pieces of hard candy. ? 4-6 oz (120-150 mL) of fruit juice. ? 4-6 oz (120-150 mL) of regular (not diet) soda. ? 1 Tbsp (15 mL) honey or sugar.  Check your blood glucose 15 minutes after you take the carbohydrate.  If the repeat blood glucose level is still at or below 70 mg/dL (3.9 mmol/L), take 15 grams of a carbohydrate again.  If your blood glucose level does not increase above 70 mg/dL (3.9 mmol/L) after 3 tries, seek emergency medical care.  After your blood glucose level returns to normal, eat a meal or a snack within 1 hour.  Treating severe hypoglycemia Severe hypoglycemia is when your blood glucose level is at or below 54 mg/dL (3 mmol/L). Severe hypoglycemia is a medical emergency. Get medical help right away. If you have severe hypoglycemia and you cannot eat or drink, you may need an injection of glucagon. A family member or close friend should learn how to check your blood glucose and how to give you a glucagon injection. Ask your health care provider if you need to have an emergency glucagon injection kit available. Severe hypoglycemia may need to be treated in a hospital. The treatment may include getting glucose through an IV. You may also need treatment for the cause of your hypoglycemia. Follow these instructions at home:  General instructions  Take over-the-counter and prescription medicines only as told by your health care provider.  Monitor your blood glucose as told by your health care provider.  Limit alcohol intake to no more than 1 drink a day for nonpregnant women and 2 drinks a day for men. One drink equals 12 oz of beer (355 mL), 5 oz of wine (148 mL), or 1 oz of hard liquor (44 mL).  Keep all follow-up visits as told by your health care provider. This is important. If you have diabetes:  Always have a rapid-acting carbohydrate snack with you to treat low blood glucose.  Follow your diabetes management plan  as directed. Make sure you: ? Know the symptoms of hypoglycemia. It is important to treat it right away to prevent it from becoming severe. ? Take your medicines as directed. ? Follow your exercise plan. ? Follow your meal plan. Eat on time, and do not skip meals. ? Check your blood glucose as often as directed. Always check before and after exercise. ? Follow your sick day plan whenever you cannot eat or drink normally. Make this plan in advance with your health care provider.  Share your diabetes management plan with people in your workplace, school, and household.  Check your urine for ketones when you are ill and as told by your health care provider.  Carry a medical alert card or wear medical alert jewelry. Contact a health care provider if:  You have problems keeping your blood glucose in your target range.  You have frequent episodes of hypoglycemia. Get help right away if:  You continue to have hypoglycemia symptoms after eating or drinking something containing glucose.  Your blood glucose is at or below 54 mg/dL (3 mmol/L).  You have a seizure.  You faint. These symptoms may represent a serious problem that is an emergency. Do not wait to see if the symptoms will go away. Get medical help right away. Call your local emergency services (911 in the [place] Summary  Hypoglycemia  occurs when the level of sugar (glucose) in the blood is too low.  Hypoglycemia can happen in people who do or do not have diabetes. It can develop quickly, and it can be a medical emergency.  Make sure you know the symptoms of hypoglycemia and how to treat it.  Always have a rapid-acting carbohydrate snack with you to treat low blood sugar. This information is not intended to replace advice given to you by your health care provider. Make sure you discuss any questions you have with your health care provider. Document Released: 10/05/2005 Document Revised: 03/28/2018 Document Reviewed:  11/08/2015 Elsevier Patient Education  [place].   Insulin Injection Instructions, Using Insulin Pens, Adult A subcutaneous injection is a shot of medicine that is injected into the layer of fat and tissue between skin and muscle. People with type 1 diabetes must take insulin because their bodies do not make it. People with type 2 diabetes may need to take insulin. There are many different types of insulin. The type of insulin that you take may determine how many injections you give yourself and when you need to give the injections. Supplies needed:  Soap and water to wash hands.  Your insulin pen.  A new, unused needle.  Alcohol wipes.  A disposal container that is meant for sharp items (sharps container), such as an empty plastic bottle with a cover. How to choose a site for injection The body absorbs insulin differently, depending on where the insulin is injected (injection site). It is best to inject insulin into the same body area each time (for example, always in the abdomen), but you should use a different spot in that area for each injection. Do not inject the insulin in the same spot each time. There are five main areas that can be used for injecting. These areas include:  Abdomen. This is the preferred area.  Front of thigh.  Upper, outer side of thigh.  Upper, outer side of arm.  Upper, outer part of buttock. How to use an insulin pen  First, follow the steps for Get ready, then continue with the steps for Inject the insulin. Get ready 1. Wash your hands with soap and water. If soap and water are not available, use hand sanitizer. 2. Before you give yourself an insulin injection, be sure to test your blood sugar level (blood glucose level) and write down that number. Follow any instructions from your health care provider about what to do if your blood glucose level is higher or lower than your normal range. 3. Check the expiration date and the type of insulin  that is in the pen. 4. If you are using CLEAR insulin, check to see that it is clear and free of clumps. 5. If you are using CLOUDY insulin, do not shake the pen to get the injection ready. Instead, get it ready in one of these ways: ? Gently roll the pen between your palms several times. ? Tip the pen up and down several times. 6. Remove the cap from the insulin pen. 7. Use an alcohol wipe to clean the rubber tip of the pen. 8. Remove the protective paper tab from the disposable needle. Do not let the needle touch anything. 9. Screw a new, unused needle onto the pen. 10. Remove the outer plastic needle cover. Do not throw away the outer plastic cover yet. ? If the pen uses a special safety needle, leave the inner needle shield in place. ? If the pen  does not use a special safety needle, remove the inner plastic cover from the needle. 11. Follow the manufacturer's instructions to prime the insulin pen with the volume of insulin needed. Hold the pen with the needle pointing up, and push the button on the opposite end of the pen until a drop of insulin appears at the needle tip. If no insulin appears, repeat this step. 12. Turn the button (dial) to the number of units of insulin that you will be injecting. Inject the insulin 1. Use an alcohol wipe to clean the site where you will be injecting the needle. Let the site air-dry. 2. Hold the pen in the palm of your writing hand like a pencil. 3. If directed by your health care provider, use your other hand to pinch and hold about an inch (2.5 cm) of skin at the injection site. Do not directly touch the cleaned part of the skin. 4. Gently but quickly, use your writing hand to put the needle straight into the skin. The needle should be at a 90-degree angle (perpendicular) to the skin. 5. When the needle is completely inserted into the skin, use your thumb or index finger of your writing hand to push the top button of the pen down all the way to inject the  insulin. 6. Let go of the skin that you are pinching. Continue to hold the pen in place with your writing hand. 7. Wait 10 seconds, then pull the needle straight out of the skin. This will allow all of the insulin to go from the pen and needle into your body. 8. Carefully put the larger (outer) plastic cover of the needle back over the needle, then unscrew the capped needle and discard it in a sharps container, such as an empty plastic bottle with a cover. 9. Put the plastic cap back on the insulin pen. How to throw away supplies  Discard all used needles in a puncture-proof sharps disposal container. You can ask your local pharmacy about where you can get this kind of disposal container, or you can use an empty plastic liquid laundry detergent bottle that has a cover.  Follow the disposal regulations for the area where you live. Do not use any needle more than one time.  Throw away empty disposable pens in the regular trash. Questions to ask your health care provider  How often should I be taking insulin?  How often should I check my blood glucose?  What amount of insulin should I be taking at each time?  What are the side effects?  What should I do if my blood glucose is too high?  What should I do if my blood glucose is too low?  What should I do if I forget to take my insulin?  What number should I call if I have questions? Where to find more information  American Diabetes Association (ADA): www.diabetes.org  American Association of Diabetes Educators (AADE) Patient Resources: https://www.diabeteseducator.org Summary  A subcutaneous injection is a shot of medicine that is injected into the layer of fat and tissue between skin and muscle.  Before you give yourself an insulin injection, be sure to test your blood sugar level (blood glucose level) and write down that number.  Check the expiration date and the type of insulin that is in the pen. The type of insulin that you  take may determine how many injections you give yourself and when you need to give the injections.  It is best to inject insulin  into the same body area each time (for example, always in the abdomen), but you should use a different spot in that area for each injection. This information is not intended to replace advice given to you by your health care provider. Make sure you discuss any questions you have with your health care provider. Document Released: 11/08/2015 Document Revised: 10/25/2017 Document Reviewed: 11/08/2015 Elsevier Patient Education  [place].  Hyperglycemia Hyperglycemia occurs when the level of sugar (glucose) in the blood is too high. Glucose is a type of sugar that provides the body's main source of energy. Certain hormones (insulin and glucagon) control the level of glucose in the blood. Insulin lowers blood glucose, and glucagon increases blood glucose. Hyperglycemia can result from having too little insulin in the bloodstream, or from the body not responding normally to insulin. Hyperglycemia occurs most often in people who have diabetes (diabetes mellitus), but it can happen in people who do not have diabetes. It can develop quickly, and it can be life-threatening if it causes you to become severely dehydrated (diabetic ketoacidosis or hyperglycemic hyperosmolar state). Severe hyperglycemia is a medical emergency. What are the causes? If you have diabetes, hyperglycemia may be caused by:  Diabetes medicine.  Medicines that increase blood glucose or affect your diabetes control.  Not eating enough, or not eating often enough.  Changes in physical activity level.  Being sick or having an infection. If you have prediabetes or undiagnosed diabetes:  Hyperglycemia may be caused by those conditions. If you do not have diabetes, hyperglycemia may be caused by:  Certain medicines, including steroid medicines, beta-blockers, epinephrine, and thiazide  diuretics.  Stress.  Serious illness.  Surgery.  Diseases of the pancreas.  Infection. What increases the risk? Hyperglycemia is more likely to develop in people who have risk factors for diabetes, such as:  Having a family member with diabetes.  Having a gene for type 1 diabetes that is passed from parent to child (inherited).  Living in an area with cold weather conditions.  Exposure to certain viruses.  Certain conditions in which the body's disease-fighting (immune) system attacks itself (autoimmune disorders).  Being overweight or obese.  Having an inactive (sedentary) lifestyle.  Having been diagnosed with insulin resistance.  Having a history of prediabetes, gestational diabetes, or polycystic ovarian syndrome (PCOS).  Being of American-Indian, African-American, Hispanic/Latino, or Asian/Pacific Islander descent. What are the signs or symptoms? Hyperglycemia may not cause any symptoms. If you do have symptoms, they may include early warning signs, such as:  Increased thirst.  Hunger.  Feeling very tired.  Needing to urinate more often than usual.  Blurry vision. Other symptoms may develop if hyperglycemia gets worse, such as:  Dry mouth.  Loss of appetite.  Fruity-smelling breath.  Weakness.  Unexpected or rapid weight gain or weight loss.  Tingling or numbness in the hands or feet.  Headache.  Skin that does not quickly return to normal after being lightly pinched and released (poor skin turgor).  Abdominal pain.  Cuts or bruises that are slow to heal. How is this diagnosed? Hyperglycemia is diagnosed with a blood test to measure your blood glucose level. This blood test is usually done while you are having symptoms. Your health care provider may also do a physical exam and review your medical history. You may have more tests to determine the cause of your hyperglycemia, such as:  A fasting blood glucose (FBG) test. You will not be allowed  to eat (you will fast) for  at least 8 hours before a blood sample is taken.  An A1c (hemoglobin A1c) blood test. This provides information about blood glucose control over the previous 2-3 months.  An oral glucose tolerance test (OGTT). This measures your blood glucose at two times: ? After fasting. This is your baseline blood glucose level. ? Two hours after drinking a beverage that contains glucose. How is this treated? Treatment depends on the cause of your hyperglycemia. Treatment may include:  Taking medicine to regulate your blood glucose levels. If you take insulin or other diabetes medicines, your medicine or dosage may be adjusted.  Lifestyle changes, such as exercising more, eating healthier foods, or losing weight.  Treating an illness or infection, if this caused your hyperglycemia.  Checking your blood glucose more often.  Stopping or reducing steroid medicines, if these caused your hyperglycemia. If your hyperglycemia becomes severe and it results in hyperglycemic hyperosmolar state, you must be hospitalized and given IV fluids. Follow these instructions at home:  General instructions  Take over-the-counter and prescription medicines only as told by your health care provider.  Do not use any products that contain nicotine or tobacco, such as cigarettes and e-cigarettes. If you need help quitting, ask your health care provider.  Limit alcohol intake to no more than 1 drink per day for nonpregnant women and 2 drinks per day for men. One drink equals 12 oz of beer, 5 oz of wine, or 1 oz of hard liquor.  Learn to manage stress. If you need help with this, ask your health care provider.  Keep all follow-up visits as told by your health care provider. This is important. Eating and drinking   Maintain a healthy weight.  Exercise regularly, as directed by your health care provider.  Stay hydrated, especially when you exercise, get sick, or spend time in hot  temperatures.  Eat healthy foods, such as: ? Lean proteins. ? Complex carbohydrates. ? Fresh fruits and vegetables. ? Low-fat dairy products. ? Healthy fats.  Drink enough fluid to keep your urine clear or pale yellow. If you have diabetes:  Make sure you know the symptoms of hyperglycemia.  Follow your diabetes management plan, as told by your health care provider. Make sure you: ? Take your insulin and medicines as directed. ? Follow your exercise plan. ? Follow your meal plan. Eat on time, and do not skip meals. ? Check your blood glucose as often as directed. Make sure to check your blood glucose before and after exercise. If you exercise longer or in a different way than usual, check your blood glucose more often. ? Follow your sick day plan whenever you cannot eat or drink normally. Make this plan in advance with your health care provider.  Share your diabetes management plan with people in your workplace, school, and household.  Check your urine for ketones when you are ill and as told by your health care provider.  Carry a medical alert card or wear medical alert jewelry. Contact a health care provider if:  Your blood glucose is at or above 240 mg/dL (13.3 mmol/L) for 2 days in a row.  You have problems keeping your blood glucose in your target range.  You have frequent episodes of hyperglycemia. Get help right away if:  You have difficulty breathing.  You have a change in how you think, feel, or act (mental status).  You have nausea or vomiting that does not go away. These symptoms may represent a serious problem that is an  emergency. Do not wait to see if the symptoms will go away. Get medical help right away. Call your local emergency services (911 in the [place] Do not drive yourself to the hospital. Summary  Hyperglycemia occurs when the level of sugar (glucose) in the blood is too high.  Hyperglycemia is diagnosed with a blood test to measure your blood  glucose level. This blood test is usually done while you are having symptoms. Your health care provider may also do a physical exam and review your medical history.  If you have diabetes, follow your diabetes management plan as told by your health care provider.  Contact your health care provider if you have problems keeping your blood glucose in your target range. This information is not intended to replace advice given to you by your health care provider. Make sure you discuss any questions you have with your health care provider. Document Released: 03/31/2001 Document Revised: 06/22/2016 Document Reviewed: 06/22/2016 Elsevier Patient Education  2020 [employer].

## 2019-06-16 NOTE — Progress Notes (Signed)
Inpatient Rehabilitation-Admissions Coordinator   [facility] still continues to follow pt for medical readiness. I have spoken with pt's wife regarding anticipated assist at DC as he has gotten weaker since the first meeting we had. She states she will have the assist needed after CIR stay.   As noted earlier, the patient may be ready for CIR Monday. I have contacted the acute therapy office to request updated treatment notes from each therapy discipline over the weekend as his insurance company require this for authorization.  One of my coworkers will follow up Monday as I will be out of the office.    Jhonnie Garner, OTR/L  Rehab Admissions Coordinator  [phone] 06/16/2019 3:49 PM

## 2019-06-16 NOTE — Progress Notes (Signed)
Physical Therapy Treatment Patient Details Name: Michael Bright MRN: [id number] DOB: 01-16-1956 Today's Date: 06/16/2019    History of Present Illness Pt adm with lt sided weakness and numbness. Pt with lt medullary infarct. Intubated 8/15 due to suspected aspiration. trach 8/21. PMH -  DM, HTN    PT Comments    Patient seen for mobility progression. Pt agreeable to working on sit to stand transfers and gait training but declines sitting in recliner due to soreness from chest percussion therapy. Pt is making progress toward PT goals and with less of a R lateral bias in sitting/standing. Pt is able to perform pre gait activities, take a few forward/backward steps, and then side steps up to [facility]. Current plan remains appropriate.    Follow Up Recommendations  CIR     Equipment Recommendations  Other (comment)(defer)    Recommendations for Other Services       Precautions / Restrictions Precautions Precautions: Fall;Other (comment) Precaution Comments: cortrak, watch BP, trach collar Restrictions Weight Bearing Restrictions: No    Mobility  Bed Mobility Overal bed mobility: Needs Assistance Bed Mobility: Supine to Sit;Sit to Supine     Supine to sit: HOB elevated;Mod assist Sit to supine: HOB elevated;Min assist   General bed mobility comments: assist to reach toward bed rail and to scoot hips with bed pad  Transfers Overall transfer level: Needs assistance Equipment used: Rolling walker (2 wheeled);2 person hand held assist Transfers: Sit to/from Stand Sit to Stand: Min assist;+2 physical assistance         General transfer comment: pt stood X 2 from EOB; cues for safe hand placement; assist to power up into standing and to steady upon standing  Ambulation/Gait Ambulation/Gait assistance: +2 physical assistance;Min assist Gait Distance (Feet): (side steps to [facility]) Assistive device: Rolling walker (2 wheeled) Gait Pattern/deviations: Step-to pattern Gait velocity:  decr   General Gait Details: pt able to march in place and take a few steps forward and back to EOB and then side steps up to HOB ~3 ft   Stairs             Wheelchair Mobility    Modified Rankin (Stroke Patients Only) Modified Rankin (Stroke Patients Only) Pre-Morbid Rankin Score: No symptoms Modified Rankin: Moderately severe disability     Balance Overall balance assessment: Needs assistance Sitting-balance support: Feet supported;Bilateral upper extremity supported Sitting balance-Leahy Scale: Fair   Postural control: Right lateral lean Standing balance support: Bilateral upper extremity supported Standing balance-Leahy Scale: Poor Standing balance comment: less R lateral bias in standing this session                            Cognition Arousal/Alertness: Awake/alert Behavior During Therapy: Flat affect Overall Cognitive Status: Difficult to assess                                        Exercises      General Comments        Pertinent Vitals/Pain Pain Assessment: Faces Faces Pain Scale: Hurts little more Pain Location: "body" - generalized Pain Descriptors / Indicators: Discomfort;Sore Pain Intervention(s): Limited activity within patient's tolerance;Monitored during session;Repositioned    Home Living                      Prior Function  PT Goals (current goals can now be found in the care plan section) Progress towards PT goals: Progressing toward goals    Frequency    Min 3X/week      PT Plan Current plan remains appropriate    Co-evaluation              AM-PAC PT "6 Clicks" Mobility   Outcome Measure  Help needed turning from your back to your side while in a flat bed without using bedrails?: A Lot Help needed moving from lying on your back to sitting on the side of a flat bed without using bedrails?: A Lot Help needed moving to and from a bed to a chair (including a  wheelchair)?: A Lot Help needed standing up from a chair using your arms (e.g., wheelchair or bedside chair)?: A Lot Help needed to walk in hospital room?: A Lot Help needed climbing 3-5 steps with a railing? : Total 6 Click Score: 11    End of Session Equipment Utilized During Treatment: Gait belt;Other (comment)(trach collar, 28% Fi02 6L/min 02) Activity Tolerance: Patient tolerated treatment well Patient left: with call bell/phone within reach;in bed;with bed alarm set Nurse Communication: Mobility status PT Visit Diagnosis: Unsteadiness on feet (R26.81);Hemiplegia and hemiparesis Hemiplegia - Right/Left: Left Hemiplegia - dominant/non-dominant: Non-dominant Hemiplegia - caused by: Cerebral infarction     Time: [id number] PT Time Calculation (min) (ACUTE ONLY): 37 min  Charges:  $Gait Training: 23-37 mins                     Erline LevineKellyn Albena Comes, PTA Acute Rehabilitation Services Pager: [phone] Office: [phone]) [phone]     Carolynne EdouardKellyn R Parlee Amescua 06/16/2019, 5:11 PM

## 2019-06-16 NOTE — Progress Notes (Addendum)
PROGRESS NOTE    Michael Bright  GYI:[id number] DOB: [birth year] DOA: 05/30/2019 PCP: Patient, No Pcp Per    Brief Narrative:  63 year old male who presented with left leg weakness and left facial droop. Patient does have history of type 2 diabetes mellitus, who has been noncompliant with his medical therapy. He developed acute left leg weakness and numbness, about 3 days prior to hospitalization. Physical examination his blood pressure was 112/73, heart rate 90, respiratory 23, oxygen saturation 89%. His lungs were clear to auscultation bilaterally, heart S1-S2 present and rhythm, abdomen soft, no extremity edema. Patient was awake and alert. His left leg was noted to be weak. Head CT was negative for acute changes. Brain MRI showed small acute infarct in the lateral left medulla. Chronic small vessel ischemia in the cerebral white matter and pons. EKGhad 118 bpm, normal axis, normal intervals, sinus rhythm, no ST segment or T wave changes.  Patient was admitted to the hospital with a working diagnosis of acute ischemic cerebrovascular infarct.  During his hospitalization he developed aspiration pneumonia complicated by acute hypoxic respiratory failure that required intubation and invasive mechanical ventilation. Patient intubated on August 15, unable to liberate from mechanical ventilation, thenunderwent tracheostomy August 21.  Transferred to [facility] 08/26.  Patient continue to have trach secretions.    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 Acute hypoxic respiratory failure due to aspiration pneumonia/ mssa/ tracheobronchitis. WBC down to 10. Positive tracheobronchitis,  continue antibiotic therapy with cefazolin. Continue with chest PT and continue speech therapy. Aspiration precautions. Oxymetry monitoring. Currently with cuffed trach, if continue to improve trach secretions, will precede to change to uncuffed, and continue speech therapy. Will keep ng tube for now for nutrition. Continue with glycopyrrolate. Remove trach sutures per protocol.   2. 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 Patient with diffuse intracranial atherosclerosis. On aspirin and clopidogrel, to plan to continue for 3 months and then continue aspirin alone. On statin therapy. Plan for CIR when more stable from respiratory perspective.   3. HTN. Blood pressure has been controlled, continue close monitoring.     4. T2DM. Continue glucose cover and monitoring with insulin sliding scale, plus 20 units of insulin glargine. Tolerating tube feedings well.   5. Dyslipidemia. Continue with statin therapy.   6. Obesity. Calculated BMI is 43.  7. Depression. Continue with quetiapine    DVT prophylaxis:enoxaparin Code Status:full Family Communication:no family at the bedside Disposition Plan/ discharge barriers:pending clinical improvement   Body mass index is 43.65 kg/m. Malnutrition Type:  Nutrition Problem: Inadequate oral intake Etiology: dysphagia   Malnutrition Characteristics:  Signs/Symptoms: NPO status   Nutrition Interventions:  Interventions: Tube feeding  RN Pressure Injury Documentation:     Consultants:   Neurology   Pulmonary   Procedures:   Trach   Antimicrobials:   Cefazolin     Subjective: Patient continue to be npo with high risk of aspiration, continue to have secretions per trach. No chest pain, no dyspnea, no nausea or vomiting. Tolerating well tube feedings per ng tube.   Objective: Vitals:   06/16/19 0900 06/16/19 1000 06/16/19 1130 06/16/19 1130  BP:   (!) 166/69 (!) 138/59  Pulse:  88 89   Resp: '20 20 18 20  '$ Temp:    98.9 F (37.2 C)  TempSrc:    Oral  SpO2:   96% 95%  Weight:      Height:        Intake/Output Summary (Last 24 hours) at 06/16/2019 1411 Last data filed at 06/16/2019 0900 Gross per 24 hour  Intake 1142.06 ml  Output 1500 ml  Net -357.94 ml   Filed Weights   06/14/19 0459 06/15/19 0409 06/16/19 0500  Weight: (!) 143.8 kg (!) 143.8 kg (!) 146 kg    Examination:   General: Not in pain or dyspnea, deconditioned  Neurology: Awake and alert, follows commands E ENT: no pallor, no icterus, oral mucosa moist/ NG tube in place, positive trach with clear secretions.  Cardiovascular: No JVD. S1-S2 present, rhythmic, no gallops, rubs, or murmurs. Trace lower extremity edema. Pulmonary: positive breath sounds bilaterally, adequate air movement, no wheezing, rhonchi or rales. Gastrointestinal. Abdomen protuberant with no organomegaly, non tender, no rebound or guarding Skin. No rashes Musculoskeletal: no joint deformities     Data Reviewed: I have personally reviewed following labs and imaging studies  CBC: Recent Labs  Lab 06/12/19 0547 06/13/19 0341 06/14/19 0405 06/15/19 0420 06/16/19 0347  WBC 14.5* 15.5* 14.8* 11.6* 10.2  NEUTROABS  --   --   --  7.3 6.1  HGB 11.9* 13.3 12.2* 12.9* 12.1*  HCT 36.9* 40.9 38.0* 41.1 37.8*  MCV 92.3 93.0 94.8 96.9 94.0  PLT 314 353 361 402* 081   Basic Metabolic Panel: Recent Labs  Lab 06/10/19 1625 06/11/19 0514 06/12/19 0131 06/12/19 0547 06/15/19 0420 06/16/19 0347  NA 140 139 143 141 144 144  K 4.0 3.8 3.8 4.0 3.5 3.8  CL 109 107  --  107 107 106  CO2 24 24  --  '26 26 28  '$ GLUCOSE 151* 199*  --  174* 213* 206*  BUN 15 21  --  18 27* 24*  CREATININE 0.77 0.81  --  0.83 0.88 0.80  CALCIUM 8.4* 8.4*  --  8.4* 8.7* 8.5*  MG  --   --   --  2.3  --   --   PHOS 3.8 3.1  --   --   --   --    GFR: Estimated Creatinine Clearance: 142.2 mL/min (by C-G formula based on SCr of 0.8 mg/dL). Liver Function  Tests: Recent Labs  Lab 06/10/19 1625 06/11/19 0514  ALBUMIN 2.2* 2.2*   No results for input(s): LIPASE, AMYLASE in the last 168 hours. No results for input(s): AMMONIA in the last 168 hours. Coagulation Profile: No results for input(s): INR, PROTIME in the last 168 hours. Cardiac Enzymes: No results for input(s): CKTOTAL, CKMB, CKMBINDEX, TROPONINI in the last 168 hours. BNP (last 3 results) No results for input(s): PROBNP in the last 8760 hours. HbA1C: No results for input(s): HGBA1C in the last 72 hours. CBG: Recent Labs  Lab 06/15/19 2009 06/15/19 2324 06/16/19 0405 06/16/19 0850 06/16/19 1133  GLUCAP 145* 178* 179* 177* 179*   Lipid Profile: No results for input(s): CHOL, HDL, LDLCALC, TRIG, CHOLHDL, LDLDIRECT in the last 72 hours. Thyroid Function Tests: No results for input(s): TSH, T4TOTAL, FREET4, T3FREE, THYROIDAB in the last 72 hours. Anemia Panel: No results for input(s): VITAMINB12, FOLATE, FERRITIN, TIBC, IRON, RETICCTPCT in the last 72 hours.    Radiology Studies: I have reviewed all of the imaging during this hospital visit personally     Scheduled  Meds: . aspirin  81 mg Per Tube Daily  . atorvastatin  40 mg Per Tube q1800  . chlorhexidine gluconate (MEDLINE KIT)  15 mL Mouth Rinse BID  . Chlorhexidine Gluconate Cloth  6 each Topical Daily  . clopidogrel  75 mg Per Tube Daily  . enoxaparin (LOVENOX) injection  70 mg Subcutaneous Daily  . famotidine  20 mg Per Tube BID  . feeding supplement (PRO-STAT SUGAR FREE 64)  60 mL Per Tube TID  . finasteride  5 mg Oral Daily  . glycopyrrolate  1 mg Per Tube BID  . insulin aspart  0-15 Units Subcutaneous Q4H  . insulin glargine  20 Units Subcutaneous Daily  . insulin starter kit- pen needles  1 kit Other Once  . living well with diabetes book   Does not apply Once  . mouth rinse  15 mL Mouth Rinse 10 times per day  . QUEtiapine  100 mg Per Tube QHS  . QUEtiapine  50 mg Per Tube Daily  . scopolamine  1  patch Transdermal Q72H  . sodium chloride flush  10-40 mL Intracatheter Q12H   Continuous Infusions: .  ceFAZolin (ANCEF) IV 2 g (06/16/19 0609)  . feeding supplement (VITAL AF 1.2 CAL) 1,000 mL (06/15/19 2158)     LOS: 17 days        Mikaelah Trostle Gerome Apley, MD

## 2019-06-17 LAB — CBC WITH DIFFERENTIAL/PLATELET
Abs Immature Granulocytes: 0.04 10*3/uL (ref 0.00–0.07)
Basophils Absolute: 0 10*3/uL (ref 0.0–0.1)
Basophils Relative: 0 %
Eosinophils Absolute: 0.1 10*3/uL (ref 0.0–0.5)
Eosinophils Relative: 1 %
HCT: 39.3 % (ref 39.0–52.0)
Hemoglobin: 12 g/dL — ABNORMAL LOW (ref 13.0–17.0)
Immature Granulocytes: 0 %
Lymphocytes Relative: 36 %
Lymphs Abs: 3.3 10*3/uL (ref 0.7–4.0)
MCH: 29.9 pg (ref 26.0–34.0)
MCHC: 30.5 g/dL (ref 30.0–36.0)
MCV: 97.8 fL (ref 80.0–100.0)
Monocytes Absolute: 0.8 10*3/uL (ref 0.1–1.0)
Monocytes Relative: 8 %
Neutro Abs: 4.9 10*3/uL (ref 1.7–7.7)
Neutrophils Relative %: 55 %
Platelets: 404 10*3/uL — ABNORMAL HIGH (ref 150–400)
RBC: 4.02 MIL/uL — ABNORMAL LOW (ref 4.22–5.81)
RDW: 12 % (ref 11.5–15.5)
WBC: 9.2 10*3/uL (ref 4.0–10.5)
nRBC: 0 % (ref 0.0–0.2)

## 2019-06-17 LAB — BASIC METABOLIC PANEL
Anion gap: 7 (ref 5–15)
BUN: 19 mg/dL (ref 8–23)
CO2: 32 mmol/L (ref 22–32)
Calcium: 8.5 mg/dL — ABNORMAL LOW (ref 8.9–10.3)
Chloride: 105 mmol/L (ref 98–111)
Creatinine, Ser: 0.71 mg/dL (ref 0.61–1.24)
GFR calc Af Amer: >60 mL/min (ref >60)
GFR calc non Af Amer: >60 mL/min (ref >60)
Glucose, Bld: 209 mg/dL — ABNORMAL HIGH (ref 70–99)
Potassium: 3.6 mmol/L (ref 3.5–5.1)
Sodium: 144 mmol/L (ref 135–145)

## 2019-06-17 LAB — GLUCOSE, CAPILLARY
Glucose-Capillary: 154 mg/dL — ABNORMAL HIGH (ref 70–99)
Glucose-Capillary: 181 mg/dL — ABNORMAL HIGH (ref 70–99)
Glucose-Capillary: 170 mg/dL — ABNORMAL HIGH (ref 70–99)
Glucose-Capillary: 153 mg/dL — ABNORMAL HIGH (ref 70–99)

## 2019-06-17 NOTE — Plan of Care (Signed)
Progressing towards goals

## 2019-06-17 NOTE — Progress Notes (Signed)
Physical Therapy Treatment Patient Details Name: Michael Bright MRN: [id number] DOB: 01/25/1956 Today's Date: 06/17/2019    History of Present Illness Pt adm with lt sided weakness and numbness. Pt with lt medullary infarct. Intubated 8/15 due to suspected aspiration. trach 8/21. PMH -  DM, HTN    PT Comments    Patient seen for mobility progression. Pt is making progress toward PT goals and able to ambulate 5 ft forward/backward X 2 trials and side steps 4 ft to [facility] with min/mod A (+2 for safety and to manage lines). Continue to progress as tolerated.     Follow Up Recommendations  CIR     Equipment Recommendations  Other (comment)(defer)    Recommendations for Other Services       Precautions / Restrictions Precautions Precautions: Fall;Other (comment) Precaution Comments: cortrak, watch BP, trach collar Restrictions Weight Bearing Restrictions: No    Mobility  Bed Mobility Overal bed mobility: Needs Assistance Bed Mobility: Supine to Sit;Sit to Supine     Supine to sit: HOB elevated;Min assist Sit to supine: HOB elevated;Min guard   General bed mobility comments: use of bed rail and increased time and effort; assist needed to bring L hip to EOB with bed pad  Transfers Overall transfer level: Needs assistance Equipment used: Rolling walker (2 wheeled);2 person hand held assist Transfers: Sit to/from Stand Sit to Stand: Min assist;+2 safety/equipment         General transfer comment: cues for safe hand placement; assist to power up   Ambulation/Gait Ambulation/Gait assistance: Min assist;Mod assist;+2 safety/equipment Gait Distance (Feet): (5-6 ft forward and backwards X 2 and then side steps 4 ft ) Assistive device: Rolling walker (2 wheeled) Gait Pattern/deviations: Decreased stance time - left;Decreased weight shift to left;Decreased step length - right;Decreased step length - left;Step-to pattern Gait velocity: decr   General Gait Details: pt with R  lateral bias in standing and able to correct with cues; fatigues quickly and needed seated break; cues for sequencing, posture, and use of AD   Stairs             Wheelchair Mobility    Modified Rankin (Stroke Patients Only) Modified Rankin (Stroke Patients Only) Pre-Morbid Rankin Score: No symptoms Modified Rankin: Moderately severe disability     Balance Overall balance assessment: Needs assistance Sitting-balance support: Feet supported;Bilateral upper extremity supported Sitting balance-Leahy Scale: Fair     Standing balance support: Bilateral upper extremity supported Standing balance-Leahy Scale: Poor Standing balance comment: less R lateral bias in standing this session                            Cognition Arousal/Alertness: Awake/alert Behavior During Therapy: WFL for tasks assessed/performed Overall Cognitive Status: Difficult to assess                                        Exercises      General Comments        Pertinent Vitals/Pain Pain Assessment: Faces Faces Pain Scale: Hurts little more Pain Location: "body" - generalized Pain Descriptors / Indicators: Discomfort;Sore Pain Intervention(s): Limited activity within patient's tolerance;Monitored during session;Repositioned    Home Living                      Prior Function            PT  Goals (current goals can now be found in the care plan section) Progress towards PT goals: Progressing toward goals    Frequency    Min 3X/week      PT Plan Current plan remains appropriate    Co-evaluation              AM-PAC PT "6 Clicks" Mobility   Outcome Measure  Help needed turning from your back to your side while in a flat bed without using bedrails?: A Little Help needed moving from lying on your back to sitting on the side of a flat bed without using bedrails?: A Lot Help needed moving to and from a bed to a chair (including a wheelchair)?: A  Little Help needed standing up from a chair using your arms (e.g., wheelchair or bedside chair)?: A Little Help needed to walk in hospital room?: A Lot Help needed climbing 3-5 steps with a railing? : Total 6 Click Score: 14    End of Session Equipment Utilized During Treatment: Gait belt;Other (comment) Activity Tolerance: Patient tolerated treatment well Patient left: with call bell/phone within reach;in bed;with bed alarm set Nurse Communication: Mobility status PT Visit Diagnosis: Unsteadiness on feet (R26.81);Hemiplegia and hemiparesis Hemiplegia - Right/Left: Left Hemiplegia - dominant/non-dominant: Non-dominant Hemiplegia - caused by: Cerebral infarction     Time: 1410-1430 PT Time Calculation (min) (ACUTE ONLY): 20 min  Charges:  $Gait Training: 8-22 mins                     Erline LevineKellyn Jozlynn Plaia, PTA Acute Rehabilitation Services Pager: [phone] Office: [phone]     Carolynne EdouardKellyn R Salimatou Simone 06/17/2019, 4:07 PM

## 2019-06-17 NOTE — Progress Notes (Signed)
PROGRESS NOTE    Michael Bright  MVH:[id number] DOB: 1956/01/02 DOA: 05/30/2019 PCP: Patient, No Pcp Per    Brief Narrative:  63 year old male who presented with left leg weakness and left facial droop. Patient does have history of type 2 diabetes mellitus, who has been noncompliant with his medical therapy. He developed acute left leg weakness and numbness, about 3 days prior to hospitalization. Physical examination his blood pressure was 112/73, heart rate 90, respiratory 23, oxygen saturation 89%. His lungs were clear to auscultation bilaterally, heart S1-S2 present and rhythm, abdomen soft, no extremity edema. Patient was awake and alert. His left leg was noted to be weak. Head CT was negative for acute changes. Brain MRI showed small acute infarct in the lateral left medulla. Chronic small vessel ischemia in the cerebral white matter and pons. EKGhad 118 bpm, normal axis, normal intervals, sinus rhythm, no ST segment or T wave changes.  Patient was admitted to the hospital with a working diagnosis of acute ischemic cerebrovascular infarct.  During his hospitalization he developed aspiration pneumonia complicated by acute hypoxic respiratory failure that required intubation and invasive mechanical ventilation. Patient intubated on August 15, unable to liberate from mechanical ventilation, thenunderwent tracheostomy August 21.  Transferred to [facility] 08/26.  Patient continue to have increased trach secretions.Has remained with an cuffed trach due to risk of recurrent respiratory failure. Continue to have swallow evaluation, and getting nutrition per NG.    Assessment & Plan:   Principal Problem:   Acute CVA (cerebrovascular accident) ([place]) Active Problems:   Uncontrolled secondary diabetes mellitus with stage 3 CKD (GFR 30-59) (HCC)   Dysphagia   Acute respiratory failure (HCC)   Aspiration pneumonia (HCC)   Essential hypertension   Type 2 diabetes mellitus with  hyperlipidemia ([place])      1. Acute hypoxic respiratory failure due to aspiration pneumonia/ mssa/ tracheobronchitis.WBC down to 9,2. Improved trach secretions, will continue with cefazolin as planned for total 8 days. Continue chest PT and suctioning as needed. Continue glycopyrrolate. Case discussed with Dr. Tamala Julian from pulmonary possible change to uncuffed trach in order patient to work with speech therapy. Continue with aspiration precautions. NG tube for feedings.    2. 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 Plan for CIR next week.   3. HTN.Blood pressure 154/78, continue close monitoring. Not on antihypertensive medications.   4. T2DM.Glucose 153, 180, 154, 209, 181. Will continue glucose cover and monitoring with insulin sliding scale. Basal insulin regimen with  20 units of insulin glargine. Continue tube feedings.  5. Dyslipidemia.On statin therapy.   6. Obesity. His calculated BMI is 43.  7. Depression. On quetiapine. No agitation, patient not verbal due to trach.     DVT prophylaxis:enoxaparin Code Status:full Family Communication:no family at the bedside Disposition Plan/ discharge barriers:pending clinical improvement    Body mass index is 43.29 kg/m. Malnutrition Type:  Nutrition Problem: Inadequate oral intake Etiology: dysphagia   Malnutrition Characteristics:  Signs/Symptoms: NPO status   Nutrition Interventions:  Interventions: Tube feeding  RN Pressure Injury Documentation:     Consultants:   Pulmonary   Procedures:   Trach   Antimicrobials:   Cefazolin IV     Subjective: Patient continue to have trach secretions with decreased intensity, no dyspnea, no chest pain, tolerating tube feedings well. No agitation.   Objective: Vitals:    06/17/19 0738 06/17/19 1100 06/17/19 1119 06/17/19  1141  BP: (!) 145/64   (!) 154/78  Pulse: 75  86   Resp: (!) '24 20 20 20  '$ Temp:    98.2 F (36.8 C)  TempSrc:      SpO2: 93%  93%   Weight:      Height:        Intake/Output Summary (Last 24 hours) at 06/17/2019 1315 Last data filed at 06/17/2019 1000 Gross per 24 hour  Intake 1020 ml  Output 1100 ml  Net -80 ml   Filed Weights   06/15/19 0409 06/16/19 0500 06/17/19 0500  Weight: (!) 143.8 kg (!) 146 kg (!) 144.8 kg    Examination:   General: deconditioned and ill looking appearing.  Neurology: Awake and alert, non focal  E ENT: no pallor, no icterus, oral mucosa moist. Positive trach.  Cardiovascular: No JVD. S1-S2 present, rhythmic, no gallops, rubs, or murmurs. No lower extremity edema. Pulmonary: positive breath sounds bilaterally, no wheezing, scattered rhonchi, but not rales. Gastrointestinal. Abdomen protuberant with no organomegaly, non tender, no rebound or guarding Skin. No rashes Musculoskeletal: no joint deformities     Data Reviewed: I have personally reviewed following labs and imaging studies  CBC: Recent Labs  Lab 06/13/19 0341 06/14/19 0405 06/15/19 0420 06/16/19 0347 06/17/19 0533  WBC 15.5* 14.8* 11.6* 10.2 9.2  NEUTROABS  --   --  7.3 6.1 4.9  HGB 13.3 12.2* 12.9* 12.1* 12.0*  HCT 40.9 38.0* 41.1 37.8* 39.3  MCV 93.0 94.8 96.9 94.0 97.8  PLT 353 361 402* 372 361*   Basic Metabolic Panel: Recent Labs  Lab 06/10/19 1625 06/11/19 0514 06/12/19 0131 06/12/19 0547 06/15/19 0420 06/16/19 0347 06/17/19 0533  NA 140 139 143 141 144 144 144  K 4.0 3.8 3.8 4.0 3.5 3.8 3.6  CL 109 107  --  107 107 106 105  CO2 24 24  --  '26 26 28 '$ 32  GLUCOSE 151* 199*  --  174* 213* 206* 209*  BUN 15 21  --  18 27* 24* 19  CREATININE 0.77 0.81  --  0.83 0.88 0.80 0.71  CALCIUM 8.4* 8.4*  --  8.4* 8.7* 8.5* 8.5*  MG  --   --   --  2.3  --   --   --   PHOS 3.8 3.1  --   --   --   --   --    GFR:  Estimated Creatinine Clearance: 141.5 mL/min (by C-G formula based on SCr of 0.71 mg/dL). Liver Function Tests: Recent Labs  Lab 06/10/19 1625 06/11/19 0514  ALBUMIN 2.2* 2.2*   No results for input(s): LIPASE, AMYLASE in the last 168 hours. No results for input(s): AMMONIA in the last 168 hours. Coagulation Profile: No results for input(s): INR, PROTIME in the last 168 hours. Cardiac Enzymes: No results for input(s): CKTOTAL, CKMB, CKMBINDEX, TROPONINI in the last 168 hours. BNP (last 3 results) No results for input(s): PROBNP in the last 8760 hours. HbA1C: No results for input(s): HGBA1C in the last 72 hours. CBG: Recent Labs  Lab 06/16/19 1634 06/16/19 1947 06/16/19 2330 06/17/19 0316 06/17/19 1140  GLUCAP 194* 153* 180* 154* 181*   Lipid Profile: No results for input(s): CHOL, HDL, LDLCALC, TRIG, CHOLHDL, LDLDIRECT in the last 72 hours. Thyroid Function Tests: No results for input(s): TSH, T4TOTAL, FREET4, T3FREE, THYROIDAB in the last 72 hours. Anemia Panel: No results for input(s): VITAMINB12, FOLATE, FERRITIN, TIBC, IRON, RETICCTPCT in the last 72 hours.  Radiology Studies: I have reviewed all of the imaging during this hospital visit personally     Scheduled Meds: . aspirin  81 mg Per Tube Daily  . atorvastatin  40 mg Per Tube q1800  . chlorhexidine gluconate (MEDLINE KIT)  15 mL Mouth Rinse BID  . Chlorhexidine Gluconate Cloth  6 each Topical Daily  . clopidogrel  75 mg Per Tube Daily  . enoxaparin (LOVENOX) injection  70 mg Subcutaneous Daily  . famotidine  20 mg Per Tube BID  . feeding supplement (PRO-STAT SUGAR FREE 64)  60 mL Per Tube TID  . finasteride  5 mg Oral Daily  . glycopyrrolate  1 mg Per Tube BID  . insulin aspart  0-15 Units Subcutaneous Q4H  . insulin glargine  20 Units Subcutaneous Daily  . insulin starter kit- pen needles  1 kit Other Once  . mouth rinse  15 mL Mouth Rinse 10 times per day  . QUEtiapine  100 mg Per Tube QHS  .  QUEtiapine  50 mg Per Tube Daily  . scopolamine  1 patch Transdermal Q72H  . sodium chloride flush  10-40 mL Intracatheter Q12H   Continuous Infusions: .  ceFAZolin (ANCEF) IV 2 g (06/17/19 0601)  . feeding supplement (VITAL AF 1.2 CAL) 1,000 mL (06/17/19 0009)     LOS: 18 days        Niyah Mamaril Gerome Apley, MD

## 2019-06-17 NOTE — Progress Notes (Signed)
Will have Dr. Nelda Marseille evaluate for potential switching out for cuffless trach in AM.  Erskine Emery MD

## 2019-06-18 LAB — BASIC METABOLIC PANEL
Anion gap: 9 (ref 5–15)
BUN: 18 mg/dL (ref 8–23)
CO2: 33 mmol/L — ABNORMAL HIGH (ref 22–32)
Calcium: 8.6 mg/dL — ABNORMAL LOW (ref 8.9–10.3)
Chloride: 101 mmol/L (ref 98–111)
Creatinine, Ser: 0.64 mg/dL (ref 0.61–1.24)
GFR calc Af Amer: >60 mL/min (ref >60)
GFR calc non Af Amer: >60 mL/min (ref >60)
Glucose, Bld: 174 mg/dL — ABNORMAL HIGH (ref 70–99)
Potassium: 3.7 mmol/L (ref 3.5–5.1)
Sodium: 143 mmol/L (ref 135–145)

## 2019-06-18 LAB — CBC WITH DIFFERENTIAL/PLATELET
Abs Immature Granulocytes: 0.04 10*3/uL (ref 0.00–0.07)
Basophils Absolute: 0.1 10*3/uL (ref 0.0–0.1)
Basophils Relative: 1 %
Eosinophils Absolute: 0.1 10*3/uL (ref 0.0–0.5)
Eosinophils Relative: 1 %
HCT: 38.3 % — ABNORMAL LOW (ref 39.0–52.0)
Hemoglobin: 12 g/dL — ABNORMAL LOW (ref 13.0–17.0)
Immature Granulocytes: 1 %
Lymphocytes Relative: 39 %
Lymphs Abs: 3.1 10*3/uL (ref 0.7–4.0)
MCH: 29.9 pg (ref 26.0–34.0)
MCHC: 31.3 g/dL (ref 30.0–36.0)
MCV: 95.3 fL (ref 80.0–100.0)
Monocytes Absolute: 0.7 10*3/uL (ref 0.1–1.0)
Monocytes Relative: 8 %
Neutro Abs: 4.1 10*3/uL (ref 1.7–7.7)
Neutrophils Relative %: 50 %
Platelets: 385 10*3/uL (ref 150–400)
RBC: 4.02 MIL/uL — ABNORMAL LOW (ref 4.22–5.81)
RDW: 11.9 % (ref 11.5–15.5)
WBC: 8 10*3/uL (ref 4.0–10.5)
nRBC: 0 % (ref 0.0–0.2)

## 2019-06-18 LAB — CULTURE, BLOOD (ROUTINE X 2)
Culture: NO GROWTH
Culture: NO GROWTH
Special Requests: ADEQUATE
Special Requests: ADEQUATE

## 2019-06-18 LAB — GLUCOSE, CAPILLARY
Glucose-Capillary: 146 mg/dL — ABNORMAL HIGH (ref 70–99)
Glucose-Capillary: 150 mg/dL — ABNORMAL HIGH (ref 70–99)
Glucose-Capillary: 152 mg/dL — ABNORMAL HIGH (ref 70–99)
Glucose-Capillary: 160 mg/dL — ABNORMAL HIGH (ref 70–99)
Glucose-Capillary: 173 mg/dL — ABNORMAL HIGH (ref 70–99)
Glucose-Capillary: 182 mg/dL — ABNORMAL HIGH (ref 70–99)
Glucose-Capillary: 183 mg/dL — ABNORMAL HIGH (ref 70–99)

## 2019-06-18 NOTE — Plan of Care (Signed)
Progressing towards goals

## 2019-06-18 NOTE — Progress Notes (Addendum)
   NAME:  Michael Bright, MRN:  [id number], DOB:  10/01/1956, LOS: 75 ADMISSION DATE:  05/30/2019, CONSULTATION DATE:  05/31/19 REFERRING MD:  Myna Hidalgo  CHIEF COMPLAINT:  Secretions   Brief History   [name] is a 63 y.o. 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  PCCM asked to evaluate early AM 8/12 for poor secretion management. His course has been complicated by ongoing dysphagia resulting in presumed aspiration requiring intubation.   Past Medical History  DM, HTN.  Significant Hospital Events   8/11 > admit. 8/12 > PCCM consult. 8/15 PCCM re consulted  8/15 ETT>>> 8/21 8/21 trach  Consults:  Neuro PCCM  Procedures:  ETT 8/15> 8/21 Trach (DS) 8/21 >>   Significant Diagnostic Tests:  CT head 8/11 > neg. MRI brain 8/11 > small left medulla infarct. Renal US 8/11 > neg. Echo 8/11 > EF 60-65%. KUB 8/24 > no obstruction or free air  Micro Data:  SARS CoV2 8/11 > Neg. MRSA  05/31/2019>> Neg 8/15 Blood Cx >>NGTD 8/15 Sputum Cx rare staph aureus pan sens.    Antimicrobials:  06/03/2019 Unasyn> 8/17 Ancef 8/17 >  Interim history/subjective:  No events overnight, no new complaints   Objective:  Blood pressure (!) 164/83, pulse 82, temperature 98.8 F (37.1 C), temperature source Axillary, resp. rate 17, height 6' (1.829 m), weight (!) 144.8 kg, SpO2 96 %.    FiO2 (%):  [28 %] 28 %   Intake/Output Summary (Last 24 hours) at 06/18/2019 1149 Last data filed at 06/18/2019 0900 Gross per 24 hour  Intake 1427.37 ml  Output 1050 ml  Net 377.37 ml   Filed Weights   06/15/19 0409 06/16/19 0500 06/17/19 0500  Weight: (!) 143.8 kg (!) 146 kg (!) 144.8 kg    Examination: General: Chronically ill appearing male, NAD HENT: [place]/AT, PERRL, EOM-I and MMM, trach in place CV: RRR, Nl S1/S2 and -M/R/G Lungs: Coarse BS diffusely, large amount of secretions in the collar ABD: Soft, NT, ND and +BS GU: Foley in place EXT: No pitting edema, no cyanosis, no foot wounds. Skin: No  rashes Neuro: Awake, tracking around the room, moving all extremities spontaneously.  I reviewed CXR myself, trach is in a good position  Assessment & Plan:   Acute Respiratory Failure suspected 2/2 aspiration episode on 8/15>> TF suctioned with NTS with acute decompensation.  Trach on 8/21.  Currently doing well on trach collar, but concerning secretions with a new fever. - CXR to PRN at this point - F/u on cultures - Trach care per protocol - Titrate O2 for sat of 88-92% - VAP prevention - SLP when neurologically more improved - PMV per SLP - No decannulation - Change trach to a cuffless 6, no downsize due to secretions and mental status, this is a chronic trach situation at this point, no downsize  PCCM will sign off, please call back if needed  Rush Farmer, M.D. [facility]monary[facility] Medicine. Pager: [phone]. After hours pager: [phone].

## 2019-06-18 NOTE — Progress Notes (Signed)
PROGRESS NOTE    Michael Bright  ZOX:[id number] DOB: 24-Jul-1956 DOA: 05/30/2019 PCP: Patient, No Pcp Per    Brief Narrative:  63 year old male who presented with left leg weakness and left facial droop. Patient does have history of type 2 diabetes mellitus, who has been noncompliant with his medical therapy. He developed acute left leg weakness and numbness, about 3 days prior to hospitalization. Physical examination his blood pressure was 112/73, heart rate 90, respiratory 23, oxygen saturation 89%. His lungs were clear to auscultation bilaterally, heart S1-S2 present and rhythm, abdomen soft, no extremity edema. Patient was awake and alert. His left leg was noted to be weak. Head CT was negative for acute changes. Brain MRI showed small acute infarct in the lateral left medulla. Chronic small vessel ischemia in the cerebral white matter and pons. EKGhad 118 bpm, normal axis, normal intervals, sinus rhythm, no ST segment or T wave changes.  Patient was admitted to the hospital with a working diagnosis of acute ischemic cerebrovascular infarct.  During his hospitalization he developed aspiration pneumonia complicated by acute hypoxic respiratory failure that required intubation and invasive mechanical ventilation. Patient intubated on August 15, unable to liberate from mechanical ventilation, thenunderwent tracheostomy August 21.  Transferred to [facility] 08/26.  Patient continue to have increased trach secretions.Has remained with an cuffed trach due to risk of recurrent respiratory failure. Continue to have swallow evaluation, and getting nutrition per NG.    Assessment & Plan:   Principal Problem:   Acute CVA (cerebrovascular accident) ([place]) Active Problems:   Uncontrolled secondary diabetes mellitus with stage 3 CKD (GFR 30-59) (HCC)   Dysphagia   Acute respiratory failure (HCC)   Aspiration pneumonia (HCC)   Essential hypertension   Type 2 diabetes mellitus with  hyperlipidemia ([place])   1. Acute hypoxic respiratory failure due to aspiration pneumonia/ mssa/ tracheobronchitis. -WBC down to 9,2.  -Still with increased trach secretions; but overall trach secretions, will continue with cefazolin as planned and complete total 8 days.  -Continue chest PT and suctioning as needed.  -Continue glycopyrrolate.  -follow rec's by PCCM -follow speech therapy eval and rec's -continue NG tube for feedings.    2. 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 -Patient with diffuse intracranial atherosclerosis.Continue with aspirin and clopidogrel, for3 months and then continue aspirin alone. -Continue with statin.  -Plan for CIR at some point next week.   3. HTN. -Overall stable -Continue monitoring of antihypertensive agents.  4. T2DM. -Continue sliding scale insulin -Lantus regimen increased to 22 units for better sugar control -Continue to follow CBGs and adjust hypoglycemic regimen as needed.   -Currently receiving tube feedings.  5. Dyslipidemia. -continue statins.   6. Obesity.  -His calculated BMI is 43. -Patient will benefit of low calorie diet and portion control -currently receiving TF.  7. Depression.  -Continue Seroquel  -Mood overall stable, no agitation -patient not verbal due to trach; able to follow commands properly .     DVT prophylaxis:enoxaparin Code Status:full Family Communication:no family at the bedside Disposition Plan/ discharge barriers:pending clinical improvement. Will need SNF vs LTACH.    Body mass index is 43.29 kg/m. Malnutrition Type:  Nutrition Problem: Inadequate oral intake Etiology: dysphagia   Malnutrition Characteristics:  Signs/Symptoms: NPO status   Nutrition Interventions:  Interventions: Tube feeding  RN Pressure Injury Documentation:     Consultants:   Pulmonary   Procedures:   Trach   Antimicrobials:   Cefazolin IV      Subjective: Afebrile, no chest  pain, so far tolerating tube feedings.  Continues to have significant increase in tracheostomy secretions and difficulty clearing airways.  More alert and interactive today.  Objective: Vitals:   06/18/19 0030 06/18/19 0409 06/18/19 0500 06/18/19 0828  BP:  (!) 142/64 (!) 146/64 (!) 164/83  Pulse: 100   82  Resp: (!) _0 Temp:  98.7 F (37.1 C)  98.8 F (37.1 C)  TempSrc:  Oral  Axillary  SpO2:    96%  Weight:      Height:        Intake/Output Summary (Last 24 hours) at 06/18/2019 1546 Last data filed at 06/18/2019 0900 Gross per 24 hour  Intake 1427.37 ml  Output 1050 ml  Net 377.37 ml   Filed Weights   06/15/19 0409 06/16/19 0500 06/17/19 0500  Weight: (!) 143.8 kg (!) 146 kg (!) 144.8 kg    Examination:  General exam: Deconditioned and ill-appearing; afebrile, denying chest pain.  Unable to speak secondary to ongoing trach.  Significant increase secretions and difficulty clearing airways appreciated on exam. Respiratory system: Diffuse rhonchi, no wheezing, no crackles. Cardiovascular system:RRR. No murmurs, rubs, gallops.  Unable to properly assess JVD secondary to tracheostomy collar in place. Gastrointestinal system: Abdomen is obese, nondistended, soft and nontender. No organomegaly or masses felt. Normal bowel sounds heard. Central nervous system: Alert and oriented. No focal neurological deficits. Extremities: No cyanosis or clubbing; trace edema bilaterally. Skin: No rashes, no petechiae. Psychiatry: Judgement and insight appear normal. Mood & affect appropriate.    Data Reviewed: I have personally reviewed following labs and imaging studies  CBC: Recent Labs  Lab 06/14/19 0405 06/15/19 0420 06/16/19 0347 06/17/19 0533 06/18/19 0603  WBC 14.8* 11.6* 10.2 9.2 8.0  NEUTROABS  --  7.3 6.1 4.9 4.1  HGB 12.2* 12.9* 12.1* 12.0* 12.0*  HCT 38.0* 41.1 37.8* 39.3 38.3*  MCV 94.8 96.9 94.0 97.8 95.3  PLT 361 402* 372 404*  630   Basic Metabolic Panel: Recent Labs  Lab 06/12/19 0547 06/15/19 0420 06/16/19 0347 06/17/19 0533 06/18/19 0603  NA 141 144 144 144 143  K 4.0 3.5 3.8 3.6 3.7  CL 107 107 106 105 101  CO2 _1 32 33*  GLUCOSE 174* 213* 206* 209* 174*  BUN 18 27* 24* 19 18  CREATININE 0.83 0.88 0.80 0.71 0.64  CALCIUM 8.4* 8.7* 8.5* 8.5* 8.6*  MG 2.3  --   --   --   --    GFR: Estimated Creatinine Clearance: 141.5 mL/min (by C-G formula based on SCr of 0.64 mg/dL).  CBG: Recent Labs  Lab 06/18/19 0011 06/18/19 0017 06/18/19 0416 06/18/19 0850 06/18/19 1121  GLUCAP 183* 182* 160* 173* 150*   Radiology Studies: I have reviewed all of the imaging during this hospital visit personally   Scheduled Meds: . aspirin  81 mg Per Tube Daily  . atorvastatin  40 mg Per Tube q1800  . chlorhexidine gluconate (MEDLINE KIT)  15 mL Mouth Rinse BID  . Chlorhexidine Gluconate Cloth  6 each Topical Daily  . clopidogrel  75 mg Per Tube Daily  . enoxaparin (LOVENOX) injection  70 mg Subcutaneous Daily  . famotidine  20 mg Per Tube BID  . feeding supplement (PRO-STAT SUGAR FREE 64)  60 mL Per Tube TID  . finasteride  5 mg Oral Daily  . glycopyrrolate  1 mg Per Tube BID  . insulin aspart  0-15 Units Subcutaneous Q4H  . insulin glargine  20  Units Subcutaneous Daily  . insulin starter kit- pen needles  1 kit Other Once  . mouth rinse  15 mL Mouth Rinse 10 times per day  . QUEtiapine  100 mg Per Tube QHS  . QUEtiapine  50 mg Per Tube Daily  . scopolamine  1 patch Transdermal Q72H  . sodium chloride flush  10-40 mL Intracatheter Q12H   Continuous Infusions: .  ceFAZolin (ANCEF) IV 2 g (06/18/19 1358)  . feeding supplement (VITAL AF 1.2 CAL) 1,000 mL (06/17/19 0009)     LOS: 19 days    Barton Dubois, MD [phone]

## 2019-06-18 NOTE — Procedures (Signed)
Tracheostomy Change Note  Patient Details:   Name: Michael Bright DOB: 1956-08-07 MRN: [id number]    Airway Documentation:     Evaluation  O2 sats: stable throughout Complications: No apparent complications Patient did tolerate procedure well. Bilateral Breath Sounds: Rhonchi      Revonda Standard 06/18/2019, 4:34 PM

## 2019-06-19 LAB — GLUCOSE, CAPILLARY
Glucose-Capillary: 177 mg/dL — ABNORMAL HIGH (ref 70–99)
Glucose-Capillary: 138 mg/dL — ABNORMAL HIGH (ref 70–99)
Glucose-Capillary: 216 mg/dL — ABNORMAL HIGH (ref 70–99)
Glucose-Capillary: 179 mg/dL — ABNORMAL HIGH (ref 70–99)
Glucose-Capillary: 160 mg/dL — ABNORMAL HIGH (ref 70–99)

## 2019-06-19 MED ORDER — OSMOLITE 1.2 CAL PO LIQD
1000.0000 mL | ORAL | Status: DC
  Administered 2019-06-19 – 2019-06-27 (×8): 1000 mL
  Filled 2019-06-19 (×12): qty 1000

## 2019-06-19 MED ORDER — CEPHALEXIN 250 MG/5ML PO SUSR
250.0000 mg | Freq: Four times a day (QID) | ORAL | Status: DC
  Administered 2019-06-19 – 2019-06-24 (×20): 250 mg
  Filled 2019-06-19 (×21): qty 5

## 2019-06-19 MED ORDER — PRO-STAT SUGAR FREE PO LIQD
30.0000 mL | Freq: Three times a day (TID) | ORAL | Status: DC
  Administered 2019-06-19 – 2019-06-27 (×23): 30 mL
  Filled 2019-06-19 (×23): qty 30

## 2019-06-19 NOTE — Progress Notes (Signed)
Nutrition Follow-up  DOCUMENTATION CODES:   Morbid obesity  INTERVENTION:  Discontinue Vital AF 1.2 formula.  Initiate Osmolite 1.2 formula via Cortrak NGT at rate of 30 ml/hr and increase by 10 ml every 4 hours to goal rate of 60 ml/hr  Provide 30 ml Prostat TID per tube.  Tube feeding regimen to provide 2028 kcal (100% of needs), 125 grams protein, and 1167 ml free water.   NUTRITION DIAGNOSIS:   Inadequate oral intake related to dysphagia as evidenced by NPO status; ongoing  GOAL:   Patient will meet greater than or equal to 90% of their needs; met with TF  MONITOR:   Skin, Weight trends, Labs, I & O's, TF tolerance  REASON FOR ASSESSMENT:   Consult Enteral/tube feeding initiation and management  ASSESSMENT:   63 yo male admitted on 8/11 with L medulla CVA with dysphagia. PMH includes DM, HTN  8/12 Transferred to ICU for respiratory decompensation, unable to clear secretions 8/13 Cortrak inserted by Diagnostics, TF initiated 8/14 Cortrak replaced (gastric)  8/15 intubated from presumed aspiration 8/21 trach placed 8/22 transitioned to trach collar    Pt continues on trach collar. Per MD, pt continued trach secretions. Pt NPO and has been tolerating her tube feeds. Pt nonverbal. Current order TF: Vital AF 1.2 via Cortrak NGT at goal rate of 40 ml/h (960 ml per day) and Prostat 60 ml TID to provide 1752 kcals (88% of kcal needs), 162 gm protein (>100% of protein needs), 778 ml free water daily. RD to modify tube feeding orders to better meet nutrition needs.   Labs and medications reviewed.   Diet Order:   Diet Order            Diet NPO time specified  Diet effective midnight              EDUCATION NEEDS:   Not appropriate for education at this time  Skin:  Skin Assessment: Reviewed RN Assessment Skin Integrity Issues:: Other (Comment) Other: MASD- sacrum  Last BM:  8/23  Height:   Ht Readings from Last 1 Encounters:  06/03/19 6' (1.829 m)     Weight:   Wt Readings from Last 1 Encounters:  06/17/19 (!) 144.8 kg    Ideal Body Weight:  80.9 kg  BMI:  Body mass index is 43.29 kg/m.  Estimated Nutritional Needs:   Kcal:  2000-2300  Protein:  110-130 grams  Fluid:  2 L    Corrin Parker, MS, RD, LDN Pager # [phone] After hours/ weekend pager # [phone]

## 2019-06-19 NOTE — Progress Notes (Signed)
Physical Therapy Treatment Patient Details Name: Michael Bright MRN: [id number] DOB: 1956/08/16 Today's Date: 06/19/2019    History of Present Illness Pt adm with lt sided weakness and numbness. Pt with lt medullary infarct. Intubated 8/15 due to suspected aspiration. trach 8/21. PMH -  DM, HTN    PT Comments    Patient seen for mobility progression. Pt presents with heavy R lateral bias and requires +2 assist for all mobility. Pt tolerated gait training distance of ~958ft with RW and max A +2. Continue to progress as tolerated.    Follow Up Recommendations  CIR     Equipment Recommendations  Other (comment)(defer)    Recommendations for Other Services       Precautions / Restrictions Precautions Precautions: Fall;Other (comment) Precaution Comments: cortrak, watch BP, trach collar Restrictions Weight Bearing Restrictions: No    Mobility  Bed Mobility Overal bed mobility: Needs Assistance Bed Mobility: Supine to Sit     Supine to sit: HOB elevated;+2 for safety/equipment;+2 for physical assistance;Max assist     General bed mobility comments: significant R lean during supine >sit with max manual facilitation and multimodal cuing needed to correct  Transfers Overall transfer level: Needs assistance Equipment used: Rolling walker (2 wheeled);2 person hand held assist Transfers: Sit to/from Stand Sit to Stand: Min assist;+2 safety/equipment         General transfer comment: cues for hand placement with bed elevated and min of 2 to power up into standing  Ambulation/Gait Ambulation/Gait assistance: Max assist;+2 physical assistance;+2 safety/equipment Gait Distance (Feet): 8 Feet Assistive device: Rolling walker (2 wheeled) Gait Pattern/deviations: Decreased stance time - left;Decreased weight shift to left;Decreased step length - right;Decreased step length - left;Step-to pattern Gait velocity: decr   General Gait Details: pt with increased R lateral lean compared  to previous session; multimodal cues needed for weight shifting and vc for safety as pt is impulsive at times   Stairs             Wheelchair Mobility    Modified Rankin (Stroke Patients Only) Modified Rankin (Stroke Patients Only) Pre-Morbid Rankin Score: No symptoms Modified Rankin: Moderately severe disability     Balance Overall balance assessment: Needs assistance Sitting-balance support: Feet supported;Bilateral upper extremity supported Sitting balance-Leahy Scale: Poor Sitting balance - Comments: min A  for static sitting balance with R lateral lean Postural control: Right lateral lean Standing balance support: Bilateral upper extremity supported Standing balance-Leahy Scale: Zero Standing balance comment: Pt leaning significantly in standing                            Cognition Arousal/Alertness: Awake/alert Behavior During Therapy: WFL for tasks assessed/performed Overall Cognitive Status: Difficult to assess                                 General Comments: Mildly impulsive, needing safety cues. Following simple commands.      Exercises      General Comments General comments (skin integrity, edema, etc.): SpO2 90% on 6L trach collar while mobilizing      Pertinent Vitals/Pain Pain Assessment: Faces Faces Pain Scale: Hurts a little bit Pain Location: generalized Pain Descriptors / Indicators: Discomfort Pain Intervention(s): Limited activity within patient's tolerance;Monitored during session;Repositioned    Home Living                      Prior  Function            PT Goals (current goals can now be found in the care plan section) Acute Rehab PT Goals Patient Stated Goal: pt "mouthing" and pointing to chair as he wished to stay up at end of session Progress towards PT goals: Progressing toward goals    Frequency    Min 3X/week      PT Plan Current plan remains appropriate    Co-evaluation  PT/OT/SLP Co-Evaluation/Treatment: Yes Reason for Co-Treatment: Complexity of the patient's impairments (multi-system involvement);For patient/therapist safety;To address functional/ADL transfers;Necessary to address cognition/behavior during functional activity PT goals addressed during session: Mobility/safety with mobility;Balance OT goals addressed during session: ADL's and self-care;Strengthening/ROM;Proper use of Adaptive equipment and DME      AM-PAC PT "6 Clicks" Mobility   Outcome Measure  Help needed turning from your back to your side while in a flat bed without using bedrails?: A Lot Help needed moving from lying on your back to sitting on the side of a flat bed without using bedrails?: A Lot Help needed moving to and from a bed to a chair (including a wheelchair)?: A Lot Help needed standing up from a chair using your arms (e.g., wheelchair or bedside chair)?: A Lot Help needed to walk in hospital room?: A Lot Help needed climbing 3-5 steps with a railing? : Total 6 Click Score: 11    End of Session Equipment Utilized During Treatment: Gait belt;Oxygen Activity Tolerance: Patient tolerated treatment well Patient left: with call bell/phone within reach;in chair;with chair alarm set Nurse Communication: Mobility status PT Visit Diagnosis: Unsteadiness on feet (R26.81);Hemiplegia and hemiparesis Hemiplegia - Right/Left: Left Hemiplegia - dominant/non-dominant: Non-dominant Hemiplegia - caused by: Cerebral infarction     Time: 0910-0943 PT Time Calculation (min) (ACUTE ONLY): 33 min  Charges:  $Gait Training: 8-22 mins                     Earney Navy, PTA Acute Rehabilitation Services Pager: [phone] Office: [phone]     Darliss Cheney 06/19/2019, 10:28 AM

## 2019-06-19 NOTE — Progress Notes (Signed)
Occupational Therapy Treatment Patient Details Name: Michael Bright MRN: [id number] DOB: 07/02/1956 Today's Date: 06/19/2019    History of present illness Pt adm with lt sided weakness and numbness. Pt with lt medullary infarct. Intubated 8/15 due to suspected aspiration. trach 8/21. PMH -  DM, HTN   OT comments  Skilled co-treatment this session with pt agreeable to OT intervention. Pt with significant R lateral lean throughout that he was unable to correct without max manual facilitation and max multimodal cuing. In standing and with 6' ambulation with max of 2 and use of RW pt continues to lean significantly as well. Pt HR increasing to 137 bpm with mobility while on 6 L O2. Pt was able to perform grooming tasks with set up A while seated.  Pt continues to benefit from OT intervenion to address functional deficits.   Follow Up Recommendations  CIR;Supervision/Assistance - 24 hour    Equipment Recommendations  Other (comment)(defer to next venue of care)       Precautions / Restrictions Precautions Precautions: Fall;Other (comment) Precaution Comments: cortrak, watch BP, trach collar       Mobility Bed Mobility Overal bed mobility: Needs Assistance Bed Mobility: Supine to Sit     Supine to sit: HOB elevated;+2 for safety/equipment;+2 for physical assistance;Max assist     General bed mobility comments: significant R lean during supine >sit with max manual facilitation and multimodal cuing needed to correct  Transfers Overall transfer level: Needs assistance Equipment used: Rolling walker (2 wheeled);2 person hand held assist Transfers: Sit to/from Stand Sit to Stand: Min assist;+2 safety/equipment         General transfer comment: cues for hand placement with bed elevated and min of 2 to power up into standing    Balance Overall balance assessment: Needs assistance Sitting-balance support: Feet supported;Bilateral upper extremity supported Sitting balance-Leahy  Scale: Poor Sitting balance - Comments: min A  for static sitting balance with R lateral lean Postural control: Right lateral lean Standing balance support: Bilateral upper extremity supported Standing balance-Leahy Scale: Zero Standing balance comment: Pt leaning significantly in standing      ADL either performed or assessed with clinical judgement   ADL Overall ADL's : Needs assistance/impaired     Grooming: Set up;Wash/dry face;Oral care Grooming Details (indicate cue type and reason): sitting in recliner, decreased coordination but able to wash face and use suction toothbrush with use of R UE                    Cognition Arousal/Alertness: Awake/alert Behavior During Therapy: WFL for tasks assessed/performed Overall Cognitive Status: Difficult to assess      General Comments: Mildly impulsive, needing safety cues. Following simple commands.                   Pertinent Vitals/ Pain       Pain Assessment: Faces Faces Pain Scale: Hurts a little bit Pain Location: generalized Pain Descriptors / Indicators: Discomfort Pain Intervention(s): Monitored during session;Repositioned      Frequency  Min 2X/week        Progress Toward Goals  OT Goals(current goals can now be found in the care plan section)  Progress towards OT goals: Progressing toward goals  Acute Rehab OT Goals Patient Stated Goal: pt "mouthing" and pointing to chair as he wished to stay up at end of session Time For Goal Achievement: 07/03/19  Plan Discharge plan remains appropriate    Co-evaluation    PT/OT/SLP Co-Evaluation/Treatment: Yes Reason  for Co-Treatment: Complexity of the patient's impairments (multi-system involvement);Necessary to address cognition/behavior during functional activity;For patient/therapist safety;To address functional/ADL transfers   OT goals addressed during session: ADL's and self-care;Strengthening/ROM;Proper use of Adaptive equipment and DME       AM-PAC OT "6 Clicks" Daily Activity     Outcome Measure   Help from another person eating meals?: A Lot Help from another person taking care of personal grooming?: A Lot Help from another person toileting, which includes using toliet, bedpan, or urinal?: Total Help from another person bathing (including washing, rinsing, drying)?: A Lot Help from another person to put on and taking off regular upper body clothing?: A Lot Help from another person to put on and taking off regular lower body clothing?: Total 6 Click Score: 10    End of Session Equipment Utilized During Treatment: Gait belt;Rolling walker;Oxygen  OT Visit Diagnosis: Unsteadiness on feet (R26.81);Other abnormalities of gait and mobility (R26.89);Muscle weakness (generalized) (M62.81);History of falling (Z91.81);Other symptoms and signs involving the nervous system (R29.898)   Activity Tolerance Patient limited by fatigue   Patient Left in chair;with call bell/phone within reach;with chair alarm set           Time: [phone] OT Time Calculation (min): 33 min  Charges: OT General Charges $OT Visit: 1 Visit OT Treatments $Self Care/Home Management : 8-22 mins   Unita Detamore P, MS, OTR/L 06/19/2019, 9:56 AM

## 2019-06-19 NOTE — Progress Notes (Signed)
  Speech Language Pathology Treatment: Dysphagia;Passy Muir Speaking valve  Patient Details Name: Michael Bright MRN: [id number] DOB: 01-Sep-1956 Today's Date: 06/19/2019 Time: [id number] SLP Time Calculation (min) (ACUTE ONLY): 45 min  Assessment / Plan / Recommendation Clinical Impression  Pt seen at bedside for skilled ST intervention targeting goals for PMSV tolerance and readiness for po intake. Pt was alert, seated in recliner upon arrival of SLP. Michael Bright was noted to be soiled with secretions, and pt indicated the need for suction. RN was called to room to assist. Pt trach has been changed to #6 Shiley  - UNcuffed. Pt was agreeable to participate in session with SLP for PMSV placement. Pt demonstrated improved tolerance of valve placement today with good phonation. Intermittent wet voice quality noted due to decreased secretion management. Pt followed commands to cough/clear throat, but was unable to orally expectorate secretions with valve in place. No air stacking noted upon removal of valve. O2 sats and respiratory rate were stable throughout session, however, pt exhibited variable heart rate. He did not indicate anxiety or difficulty breathing at any time. Tether was attached to valve, which was secured to trach tie. Valve was removed per pt request. RN informed and present in room at end of session.  During this session, oral care was completed with suction, which pt tolerated well. No po trials were given today, due to poor secretion management. Given improvement in tolerance of valve placement, recommend placement of speaking valve intermittently during the day with staff, given full supervision. Signs placed at [facility].  SLP will continue to follow acutely for PMSV use and care, as well as assessment of po readiness. Continued ST intervention is recommended at next venue to maximize communication effectiveness and swallow function/safety.   HPI HPI: Michael Bright is a 63 y.o. male with hx of  myotonic dystrophy 2 (DM2) and diabetes who presented with difficulty swallowing. Pt was transferred to ICU 8/11 for acute hypoxic respiratory failure most likely 2/2 aspiration in setting of stroke, resulting in aspiration PNA. L side hemiparesis, and diplopia. 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 Evaluated for dysphagia 8/12 and cog/ling with diagnoses of severe oropharyngeal dysphagia (NPO) and mod dysarthria. 8/14 Intubated 8/15, evaluated by SLP 8/19 with recommendation for trach. Trach placed 8/21 with SLP recommendation for PMV same day. Experienced worsening paranoia and delirium 8/23. Pt on tube feed, experiencing nausa and emesis 8/24.         SLP Plan  Continue with current plan of care       Recommendations  Diet recommendations: NPO Medication Administration: Via alternative means      Patient may use Passy-Muir Speech Valve: Intermittently with supervision;During all therapies with supervision PMSV Supervision: Full         General recommendations: Rehab consult Oral Care Recommendations: Oral care QID Follow up Recommendations: Inpatient Rehab SLP Visit Diagnosis: Aphonia (R49.1);Dysphagia, unspecified (R13.10) Plan: Continue with current plan of care       [place]. Quentin Ore [facility], CCC-SLP Speech Language Pathologist [phone]  Shonna Chock 06/19/2019, 3:57 PM

## 2019-06-19 NOTE — Progress Notes (Signed)
PROGRESS NOTE    Michael Bright  VWP:[id number] DOB: 02-13-1956 DOA: 05/30/2019 PCP: Patient, No Pcp Per    Brief Narrative:  63 year old male who presented with left leg weakness and left facial droop. Patient does have history of type 2 diabetes mellitus, who has been noncompliant with his medical therapy. He developed acute left leg weakness and numbness, about 3 days prior to hospitalization. Physical examination his blood pressure was 112/73, heart rate 90, respiratory 23, oxygen saturation 89%. His lungs were clear to auscultation bilaterally, heart S1-S2 present and rhythm, abdomen soft, no extremity edema. Patient was awake and alert. His left leg was noted to be weak. Head CT was negative for acute changes. Brain MRI showed small acute infarct in the lateral left medulla. Chronic small vessel ischemia in the cerebral white matter and pons. EKGhad 118 bpm, normal axis, normal intervals, sinus rhythm, no ST segment or T wave changes.  Patient was admitted to the hospital with a working diagnosis of acute ischemic cerebrovascular infarct.  During his hospitalization he developed aspiration pneumonia complicated by acute hypoxic respiratory failure that required intubation and invasive mechanical ventilation. Patient intubated on August 15, unable to liberate from mechanical ventilation, thenunderwent tracheostomy August 21.  Transferred [facility] 08/26.  Patient continue to have increased trach secretions.Has remained with an cuffed trach due to risk of recurrent respiratory failure. Continue to have swallow evaluation, and getting nutrition per NG.    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 Acute hypoxic respiratory failure due to aspiration pneumonia/ mssa/ tracheobronchitis.Lurline Idol has been changed to uncuffed # ^. Will continue  chest PT and as needed suctioning. On glycopyrrolate. Follow with speech therapy recommendations. To complete antibiotic therapy 06/21/19 for tracheobronchitis.   2. 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 Patient with diffuse intracranial atherosclerosis.Plan foraspirin and clopidogrel, for3 months, then aspirin alone.On atorvastatin. Follow with inpatient rehab recommendations.   3. HTN.Blood pressure 146/80. Continue to hold on antihypertensive medications, for now.   4. T2DM.Am glucose is 174, will continue with basal insulin  20 units of  Glargine, plus sliding scale. Tolerating well tube feedings.  5. Dyslipidemia.Continue with statin therapy.   6. Obesity. Calculated BMI is 43.  7. Depression. Continue with quetiapine. Follows commands, no agitation. Non verbal due to trach.  DVT prophylaxis:enoxaparin Code Status:full Family Communication:no family at the bedside Disposition Plan/ discharge barriers:pending clinical improvement    Body mass index is 43.29 kg/m. Malnutrition Type:  Nutrition Problem: Inadequate oral intake Etiology: dysphagia   Malnutrition Characteristics:  Signs/Symptoms: NPO status   Nutrition Interventions:  Interventions: Tube feeding  RN Pressure Injury Documentation:     Consultants:   Neurology   Pulmonary   Procedures:   Trach   Antimicrobials:   Cefazolin    Subjective: Patient is feeling well, continue to have trach secretions, tolerating tube feedings well, no nausea or vomiting. No chest pain or dyspnea.   Objective: Vitals:   06/19/19 0345 06/19/19 0500 06/19/19 0700 06/19/19 1151  BP: (!) 174/90  (!) 160/81   Pulse: 81 72 76 88  Resp: '18 17 18 18  '$ Temp: 98 F (36.7 C)  97.8 F (36.6 C)  TempSrc: Oral  Axillary   SpO2: 97% 99% 97% 99%  Weight:      Height:        Intake/Output Summary (Last 24 hours) at 06/19/2019 1202 Last data filed at 06/19/2019 9485 Gross per 24 hour  Intake 899.33 ml  Output 2220 ml  Net -1320.67 ml   Filed Weights   06/15/19 0409 06/16/19 0500 06/17/19 0500  Weight: (!) 143.8 kg (!) 146 kg (!) 144.8 kg    Examination:   General: Not in pain or dyspnea, deconditioned  Neurology: Awake and alert, non focal  E ENT: no pallor, no icterus, oral mucosa moist. Trach in place with thick secretions. NG still in place.  Cardiovascular: No JVD. S1-S2 present, rhythmic, no gallops, rubs, or murmurs. Trace non pitting lower extremity edema. Pulmonary: positive breath sounds bilaterally, adequate air movement, no wheezing, rhonchi or rales. Gastrointestinal. Abdomen with, no organomegaly, non tender, no rebound or guarding Skin. No rashes Musculoskeletal: no joint deformities     Data Reviewed: I have personally reviewed following labs and imaging studies  CBC: Recent Labs  Lab 06/14/19 0405 06/15/19 0420 06/16/19 0347 06/17/19 0533 06/18/19 0603  WBC 14.8* 11.6* 10.2 9.2 8.0  NEUTROABS  --  7.3 6.1 4.9 4.1  HGB 12.2* 12.9* 12.1* 12.0* 12.0*  HCT 38.0* 41.1 37.8* 39.3 38.3*  MCV 94.8 96.9 94.0 97.8 95.3  PLT 361 402* 372 404* 462   Basic Metabolic Panel: Recent Labs  Lab 06/15/19 0420 06/16/19 0347 06/17/19 0533 06/18/19 0603  NA 144 144 144 143  K 3.5 3.8 3.6 3.7  CL 107 106 105 101  CO2 26 28 32 33*  GLUCOSE 213* 206* 209* 174*  BUN 27* 24* 19 18  CREATININE 0.88 0.80 0.71 0.64  CALCIUM 8.7* 8.5* 8.5* 8.6*   GFR: Estimated Creatinine Clearance: 141.5 mL/min (by C-G formula based on SCr of 0.64 mg/dL). Liver Function Tests: No results for input(s): AST, ALT, ALKPHOS, BILITOT, PROT, ALBUMIN in the last 168 hours. No results for input(s): LIPASE, AMYLASE in the last 168 hours. No results for input(s): AMMONIA in the last  168 hours. Coagulation Profile: No results for input(s): INR, PROTIME in the last 168 hours. Cardiac Enzymes: No results for input(s): CKTOTAL, CKMB, CKMBINDEX, TROPONINI in the last 168 hours. BNP (last 3 results) No results for input(s): PROBNP in the last 8760 hours. HbA1C: No results for input(s): HGBA1C in the last 72 hours. CBG: Recent Labs  Lab 06/18/19 0850 06/18/19 1121 06/18/19 2020 06/18/19 2322 06/19/19 0347  GLUCAP 173* 150* 152* 146* 177*   Lipid Profile: No results for input(s): CHOL, HDL, LDLCALC, TRIG, CHOLHDL, LDLDIRECT in the last 72 hours. Thyroid Function Tests: No results for input(s): TSH, T4TOTAL, FREET4, T3FREE, THYROIDAB in the last 72 hours. Anemia Panel: No results for input(s): VITAMINB12, FOLATE, FERRITIN, TIBC, IRON, RETICCTPCT in the last 72 hours.    Radiology Studies: I have reviewed all of the imaging during this hospital visit personally     Scheduled Meds: . aspirin  81 mg Per Tube Daily  . atorvastatin  40 mg Per Tube q1800  . chlorhexidine gluconate (MEDLINE KIT)  15 mL Mouth Rinse BID  . Chlorhexidine Gluconate Cloth  6 each Topical Daily  . clopidogrel  75 mg Per Tube Daily  . enoxaparin (LOVENOX) injection  70 mg Subcutaneous Daily  . famotidine  20 mg Per Tube BID  . feeding supplement (PRO-STAT SUGAR FREE 64)  60 mL Per Tube TID  . finasteride  5 mg Oral Daily  . glycopyrrolate  1 mg Per Tube BID  . insulin aspart  0-15 Units Subcutaneous Q4H  . insulin glargine  20 Units Subcutaneous Daily  . insulin starter kit- pen needles  1 kit Other Once  . mouth rinse  15 mL Mouth Rinse 10 times per day  . QUEtiapine  100 mg Per Tube QHS  . QUEtiapine  50 mg Per Tube Daily  . scopolamine  1 patch Transdermal Q72H  . sodium chloride flush  10-40 mL Intracatheter Q12H   Continuous Infusions: .  ceFAZolin (ANCEF) IV 2 g (06/19/19 8675)  . feeding supplement (VITAL AF 1.2 CAL) 1,000 mL (06/17/19 0009)     LOS: 20 days         Mauricio Gerome Apley, MD

## 2019-06-19 NOTE — Progress Notes (Signed)
Inpatient Rehab Admissions Coordinator:   Met with pt at bedside to discuss CIR. He is still open to CIR.  RN notes she has had to deep suction x2 this shift.  Per Jhonnie Garner, pt's wife prepared to take pt home from CIR at any level.  Will open insurance for possible admission pending approval.   Shann Medal, PT, DPT Admissions Coordinator [phone] 06/19/19  2:09 PM

## 2019-06-19 NOTE — Progress Notes (Signed)
Tele called to say HR was in the 140s but that was during the time this nurse was suctioning him via his trach, as he was coughing and needing to be suctioned.

## 2019-06-20 LAB — GLUCOSE, CAPILLARY
Glucose-Capillary: 154 mg/dL — ABNORMAL HIGH (ref 70–99)
Glucose-Capillary: 175 mg/dL — ABNORMAL HIGH (ref 70–99)
Glucose-Capillary: 192 mg/dL — ABNORMAL HIGH (ref 70–99)
Glucose-Capillary: 197 mg/dL — ABNORMAL HIGH (ref 70–99)
Glucose-Capillary: 208 mg/dL — ABNORMAL HIGH (ref 70–99)
Glucose-Capillary: 218 mg/dL — ABNORMAL HIGH (ref 70–99)
Glucose-Capillary: 226 mg/dL — ABNORMAL HIGH (ref 70–99)

## 2019-06-20 NOTE — Progress Notes (Signed)
PROGRESS NOTE    Woodfin Kiss  PYP:[id number] DOB: 11/05/1955 DOA: 05/30/2019 PCP: Patient, No Pcp Per    Brief Narrative:  63 year old male who presented with left leg weakness and left facial droop. Patient does have history of type 2 diabetes mellitus, who has been noncompliant with his medical therapy. He developed acute left leg weakness and numbness, about 3 days prior to hospitalization. Physical examination his blood pressure was 112/73, heart rate 90, respiratory 23, oxygen saturation 89%. His lungs were clear to auscultation bilaterally, heart S1-S2 present and rhythm, abdomen soft, no extremity edema. Patient was awake and alert. His left leg was noted to be weak. Head CT was negative for acute changes. Brain MRI showed small acute infarct in the lateral left medulla. Chronic small vessel ischemia in the cerebral white matter and pons. EKGhad 118 bpm, normal axis, normal intervals, sinus rhythm, no ST segment or T wave changes.  Patient was admitted to the hospital with a working diagnosis of acute ischemic cerebrovascular infarct.  During his hospitalization he developed aspiration pneumonia complicated by acute hypoxic respiratory failure that required intubation and invasive mechanical ventilation. Patient intubated on August 15, unable to liberate from mechanical ventilation, thenunderwent tracheostomy August 21.  Transferred to [facility] 08/26.  Patient continue to haveincreasedtrach secretions.He initially remained with an cuffed trach due to risk of recurrent respiratory failure. Continue to have swallow evaluation, and getting nutrition per NG.  Changed to uncuffed trach #6 on 06/18/19.    Assessment & Plan:   Principal Problem:   Acute CVA (cerebrovascular accident) ([place]) Active Problems:   Uncontrolled secondary diabetes mellitus with stage 3 CKD (GFR 30-59) (HCC)   Dysphagia   Acute respiratory failure (HCC)   Aspiration pneumonia (HCC)   Essential  hypertension   Type 2 diabetes mellitus with hyperlipidemia ([place])    1. Acute hypoxic respiratory failure due to aspiration pneumonia/ mssa/ tracheobronchitis.No with trach uncuffed # 6.  Slowly improving secretions, and patient working with speech therapy, using passy-muir valve. Continue with glycopyrrolate. Antibiotic therapy with cefazolin until 06/21/19 for tracheobronchitis.   2. 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 Patient with diffuse intracranial atherosclerosis.Continue withaspirin and clopidogrel,for3 months, then plan to continue aspirin alone.Continue with atorvastatin. Possible transfer to CIR when further improvement in trach secretions, potentially can be transferred with NG in place and continue with speech therapy.    3. HTN.Holding onantihypertensive medications, to prevent hypotension.  4. T2DM.Today's am glucose 174 mg/dl. On basal insulin20 units of  Glargine, and insulin sliding scale. On tube feedings.   5. Dyslipidemia.On statin therapy with atorvastatin.   6. Obesity.His calculated BMI is 43.  7. Depression.Onquetiapine.   DVT prophylaxis:enoxaparin Code Status:full Family Communication:no family at the bedside Disposition Plan/ discharge barriers:pending clinical improvement, CIR when trach secretions improve.      Body mass index is 43.41 kg/m. Malnutrition Type:  Nutrition Problem: Inadequate oral intake Etiology: dysphagia   Malnutrition Characteristics:  Signs/Symptoms: NPO status   Nutrition Interventions:  Interventions: Tube feeding  RN Pressure Injury Documentation:     Consultants:   Pulmonary   neurology   Procedures:   Tracheostomy   Antimicrobials:   IV cephalexin.      Subjective: Trach secretions continue to improve, but remain persistent, has been using Passy-Muir valve for talking, no nausea or vomiting, no chest pain or dyspnea, continue to be very weak  and deconditioning.   Objective: Vitals:   06/20/19 0408 06/20/19 0429 06/20/19 0700 06/20/19 0814  BP: (!) 151/76  Marland Kitchen)  166/92 (!) 166/92  Pulse: 85  74 79  Resp:   18 18  Temp: 99.1 F (37.3 C)  98.4 F (36.9 C)   TempSrc: Axillary  Axillary   SpO2: 97%  95% 99%  Weight:  (!) 145.2 kg    Height:        Intake/Output Summary (Last 24 hours) at 06/20/2019 1018 Last data filed at 06/20/2019 0300 Gross per 24 hour  Intake 937 ml  Output 1200 ml  Net -263 ml   Filed Weights   06/16/19 0500 06/17/19 0500 06/20/19 0429  Weight: (!) 146 kg (!) 144.8 kg (!) 145.2 kg    Examination:   General: deconditioned, not in pain or dyspnea.  Neurology: Awake and alert, moves all 4 extremities, follows commands.   E ENT: no pallor, no icterus, oral mucosa moist/ trach in place with thick secretions, ng tube in place.  Cardiovascular: No JVD. S1-S2 present, rhythmic, no gallops, rubs, or murmurs. No lower extremity edema. Pulmonary: positive breath sounds bilaterally, adequate air movement, no wheezing, rhonchi or rales. Gastrointestinal. Abdomen protuberant no organomegaly, non tender, no rebound or guarding Skin. No rashes Musculoskeletal: no joint deformities     Data Reviewed: I have personally reviewed following labs and imaging studies  CBC: Recent Labs  Lab 06/14/19 0405 06/15/19 0420 06/16/19 0347 06/17/19 0533 06/18/19 0603  WBC 14.8* 11.6* 10.2 9.2 8.0  NEUTROABS  --  7.3 6.1 4.9 4.1  HGB 12.2* 12.9* 12.1* 12.0* 12.0*  HCT 38.0* 41.1 37.8* 39.3 38.3*  MCV 94.8 96.9 94.0 97.8 95.3  PLT 361 402* 372 404* 094   Basic Metabolic Panel: Recent Labs  Lab 06/15/19 0420 06/16/19 0347 06/17/19 0533 06/18/19 0603  NA 144 144 144 143  K 3.5 3.8 3.6 3.7  CL 107 106 105 101  CO2 26 28 32 33*  GLUCOSE 213* 206* 209* 174*  BUN 27* 24* 19 18  CREATININE 0.88 0.80 0.71 0.64  CALCIUM 8.7* 8.5* 8.5* 8.6*   GFR: Estimated Creatinine Clearance: 141.6 mL/min (by C-G formula  based on SCr of 0.64 mg/dL). Liver Function Tests: No results for input(s): AST, ALT, ALKPHOS, BILITOT, PROT, ALBUMIN in the last 168 hours. No results for input(s): LIPASE, AMYLASE in the last 168 hours. No results for input(s): AMMONIA in the last 168 hours. Coagulation Profile: No results for input(s): INR, PROTIME in the last 168 hours. Cardiac Enzymes: No results for input(s): CKTOTAL, CKMB, CKMBINDEX, TROPONINI in the last 168 hours. BNP (last 3 results) No results for input(s): PROBNP in the last 8760 hours. HbA1C: No results for input(s): HGBA1C in the last 72 hours. CBG: Recent Labs  Lab 06/19/19 1559 06/19/19 2026 06/20/19 0041 06/20/19 0428 06/20/19 0742  GLUCAP 179* 160* 154* 192* 208*   Lipid Profile: No results for input(s): CHOL, HDL, LDLCALC, TRIG, CHOLHDL, LDLDIRECT in the last 72 hours. Thyroid Function Tests: No results for input(s): TSH, T4TOTAL, FREET4, T3FREE, THYROIDAB in the last 72 hours. Anemia Panel: No results for input(s): VITAMINB12, FOLATE, FERRITIN, TIBC, IRON, RETICCTPCT in the last 72 hours.    Radiology Studies: I have reviewed all of the imaging during this hospital visit personally     Scheduled Meds: . aspirin  81 mg Per Tube Daily  . atorvastatin  40 mg Per Tube q1800  . cephALEXin  250 mg Per Tube Q6H  . chlorhexidine gluconate (MEDLINE KIT)  15 mL Mouth Rinse BID  . Chlorhexidine Gluconate Cloth  6 each Topical Daily  . clopidogrel  75 mg Per Tube Daily  . enoxaparin (LOVENOX) injection  70 mg Subcutaneous Daily  . famotidine  20 mg Per Tube BID  . feeding supplement (PRO-STAT SUGAR FREE 64)  30 mL Per Tube TID  . finasteride  5 mg Oral Daily  . glycopyrrolate  1 mg Per Tube BID  . insulin aspart  0-15 Units Subcutaneous Q4H  . insulin glargine  20 Units Subcutaneous Daily  . insulin starter kit- pen needles  1 kit Other Once  . mouth rinse  15 mL Mouth Rinse 10 times per day  . QUEtiapine  100 mg Per Tube QHS  .  QUEtiapine  50 mg Per Tube Daily  . scopolamine  1 patch Transdermal Q72H  . sodium chloride flush  10-40 mL Intracatheter Q12H   Continuous Infusions: . feeding supplement (OSMOLITE 1.2 CAL) 1,000 mL (06/19/19 1746)     LOS: 21 days        Ione Sandusky Gerome Apley, MD

## 2019-06-20 NOTE — Progress Notes (Signed)
Spent about 30 minutes with patient. Tracheostomy care done. Patient has thick, tan and greenish tinged secretions. Crusted mucus noted around stoma and on flange and on gown - this was cleaned. Inner cannula cleaned and replaced. Split gauze placed around tracheostomy tube. Patient has strong cough but needs encouragement at times to complete clearance of secretions. He is able to talk when [employer] speaking valve is placed on trach. He is potentially at risk for mucus plugging due to thickness of secretions.

## 2019-06-20 NOTE — Plan of Care (Signed)
  Problem: Clinical Measurements: Goal: Ability to maintain clinical measurements within normal limits will improve Outcome: Progressing   Problem: Respiratory: Goal: Ability to maintain a clear airway and adequate ventilation will improve Outcome: Not Progressing

## 2019-06-20 NOTE — Progress Notes (Signed)
Physical Therapy Treatment Patient Details Name: Michael Bright Christopher MRN: [id number] DOB: 1956-08-13 Today's Date: 06/20/2019    History of Present Illness Pt adm with lt sided weakness and numbness. Pt with lt medullary infarct. Intubated 8/15 due to suspected aspiration. trach 8/21. PMH -  DM, HTN    PT Comments    Patient seen for mobility progression. Pt is eager to participate in therapy. Pt requires mod A for bed mobility, min A +2 for sit to stand transfer, and mod/max A +2 for short distance gait training. Current plan remains appropriate.    Follow Up Recommendations  CIR     Equipment Recommendations  Other (comment)(defer)    Recommendations for Other Services       Precautions / Restrictions Precautions Precautions: Fall;Other (comment) Precaution Comments: cortrak, watch BP, trach collar; PMV with therapies Restrictions Weight Bearing Restrictions: No    Mobility  Bed Mobility Overal bed mobility: Needs Assistance Bed Mobility: Supine to Sit     Supine to sit: HOB elevated;Mod assist     General bed mobility comments: assist to elevate trunk into sitting and to bring L hip to EOB   Transfers Overall transfer level: Needs assistance Equipment used: Rolling walker (2 wheeled);2 person hand held assist Transfers: Sit to/from Stand Sit to Stand: Min assist;+2 safety/equipment         General transfer comment: cues for hand placement with bed elevated and min of 2 to power up into standing  Ambulation/Gait Ambulation/Gait assistance: Max assist;+2 physical assistance;+2 safety/equipment;Mod assist Gait Distance (Feet): 6 Feet Assistive device: Rolling walker (2 wheeled) Gait Pattern/deviations: Decreased stance time - left;Decreased weight shift to left;Decreased step length - right;Decreased step length - left;Step-to pattern Gait velocity: decr   General Gait Details: initially mod A +2 and then increased assistance needed due to increased R lateral bias;  multimodal cues for weight shifting to midline and for sequencing; assist to guide RW when turning to sit down   Stairs             Wheelchair Mobility    Modified Rankin (Stroke Patients Only) Modified Rankin (Stroke Patients Only) Pre-Morbid Rankin Score: No symptoms Modified Rankin: Moderately severe disability     Balance Overall balance assessment: Needs assistance Sitting-balance support: Feet supported;Single extremity supported Sitting balance-Leahy Scale: Poor Sitting balance - Comments: min guard for sitting balance EOB; able to maintain balance better than yesterdays session  Postural control: Right lateral lean Standing balance support: Bilateral upper extremity supported Standing balance-Leahy Scale: Poor Standing balance comment: Pt leaning significantly in standing                            Cognition Arousal/Alertness: Awake/alert Behavior During Therapy: WFL for tasks assessed/performed Overall Cognitive Status: Impaired/Different from baseline Area of Impairment: Safety/judgement                         Safety/Judgement: Decreased awareness of safety;Decreased awareness of deficits     General Comments: Mildly impulsive, needing safety cues. Following simple commands.      Exercises      General Comments        Pertinent Vitals/Pain Pain Assessment: Faces Faces Pain Scale: No hurt    Home Living                      Prior Function  PT Goals (current goals can now be found in the care plan section) Progress towards PT goals: Progressing toward goals    Frequency    Min 3X/week      PT Plan Current plan remains appropriate    Co-evaluation              AM-PAC PT "6 Clicks" Mobility   Outcome Measure  Help needed turning from your back to your side while in a flat bed without using bedrails?: A Lot Help needed moving from lying on your back to sitting on the side of a flat bed  without using bedrails?: A Lot Help needed moving to and from a bed to a chair (including a wheelchair)?: A Lot Help needed standing up from a chair using your arms (e.g., wheelchair or bedside chair)?: A Lot Help needed to walk in hospital room?: A Lot Help needed climbing 3-5 steps with a railing? : Total 6 Click Score: 11    End of Session Equipment Utilized During Treatment: Gait belt;Oxygen Activity Tolerance: Patient tolerated treatment well Patient left: with call bell/phone within reach;in chair;with chair alarm set Nurse Communication: Mobility status;Other (comment)(bed linens very saturated and need to be changed) PT Visit Diagnosis: Unsteadiness on feet (R26.81);Hemiplegia and hemiparesis Hemiplegia - Right/Left: Left Hemiplegia - dominant/non-dominant: Non-dominant Hemiplegia - caused by: Cerebral infarction     Time: 2229-7989 PT Time Calculation (min) (ACUTE ONLY): 22 min  Charges:  $Gait Training: 8-22 mins                     Earney Navy, PTA Acute Rehabilitation Services Pager: [phone] Office: [phone]     Darliss Cheney 06/20/2019, 5:15 PM

## 2019-06-21 LAB — BLOOD GAS, ARTERIAL
Acid-Base Excess: 9.6 mmol/L — ABNORMAL HIGH (ref 0.0–2.0)
Bicarbonate: 33.9 mmol/L — ABNORMAL HIGH (ref 20.0–28.0)
Drawn by: 23604
FIO2: 28
O2 Saturation: 94.2 %
Patient temperature: 98.6
pCO2 arterial: 48.5 mmHg — ABNORMAL HIGH (ref 32.0–48.0)
pH, Arterial: 7.459 — ABNORMAL HIGH (ref 7.350–7.450)
pO2, Arterial: 67.6 mmHg — ABNORMAL LOW (ref 83.0–108.0)

## 2019-06-21 LAB — CBC WITH DIFFERENTIAL/PLATELET
Abs Immature Granulocytes: 0.04 10*3/uL (ref 0.00–0.07)
Basophils Absolute: 0 10*3/uL (ref 0.0–0.1)
Basophils Relative: 1 %
Eosinophils Absolute: 0.1 10*3/uL (ref 0.0–0.5)
Eosinophils Relative: 2 %
HCT: 38.7 % — ABNORMAL LOW (ref 39.0–52.0)
Hemoglobin: 12.3 g/dL — ABNORMAL LOW (ref 13.0–17.0)
Immature Granulocytes: 1 %
Lymphocytes Relative: 34 %
Lymphs Abs: 2.8 10*3/uL (ref 0.7–4.0)
MCH: 30.1 pg (ref 26.0–34.0)
MCHC: 31.8 g/dL (ref 30.0–36.0)
MCV: 94.9 fL (ref 80.0–100.0)
Monocytes Absolute: 0.8 10*3/uL (ref 0.1–1.0)
Monocytes Relative: 10 %
Neutro Abs: 4.3 10*3/uL (ref 1.7–7.7)
Neutrophils Relative %: 52 %
Platelets: 349 10*3/uL (ref 150–400)
RBC: 4.08 MIL/uL — ABNORMAL LOW (ref 4.22–5.81)
RDW: 11.8 % (ref 11.5–15.5)
WBC: 8 10*3/uL (ref 4.0–10.5)
nRBC: 0 % (ref 0.0–0.2)

## 2019-06-21 LAB — GLUCOSE, CAPILLARY
Glucose-Capillary: 197 mg/dL — ABNORMAL HIGH (ref 70–99)
Glucose-Capillary: 208 mg/dL — ABNORMAL HIGH (ref 70–99)
Glucose-Capillary: 200 mg/dL — ABNORMAL HIGH (ref 70–99)
Glucose-Capillary: 233 mg/dL — ABNORMAL HIGH (ref 70–99)
Glucose-Capillary: 247 mg/dL — ABNORMAL HIGH (ref 70–99)

## 2019-06-21 LAB — BASIC METABOLIC PANEL
Anion gap: 10 (ref 5–15)
BUN: 13 mg/dL (ref 8–23)
CO2: 32 mmol/L (ref 22–32)
Calcium: 8.6 mg/dL — ABNORMAL LOW (ref 8.9–10.3)
Chloride: 95 mmol/L — ABNORMAL LOW (ref 98–111)
Creatinine, Ser: 0.77 mg/dL (ref 0.61–1.24)
GFR calc Af Amer: >60 mL/min (ref >60)
GFR calc non Af Amer: >60 mL/min (ref >60)
Glucose, Bld: 222 mg/dL — ABNORMAL HIGH (ref 70–99)
Potassium: 4.2 mmol/L (ref 3.5–5.1)
Sodium: 137 mmol/L (ref 135–145)

## 2019-06-21 MED ORDER — AMLODIPINE BESYLATE 10 MG PO TABS
10.0000 mg | ORAL_TABLET | Freq: Every day | ORAL | Status: DC
  Administered 2019-06-21 – 2019-06-24 (×4): 10 mg via ORAL
  Filled 2019-06-21 (×4): qty 1

## 2019-06-21 MED ORDER — SCOPOLAMINE 1 MG/3DAYS TD PT72
1.0000 | MEDICATED_PATCH | TRANSDERMAL | Status: DC
  Administered 2019-06-21 – 2019-06-24 (×2): 1.5 mg via TRANSDERMAL
  Filled 2019-06-21 (×3): qty 1

## 2019-06-21 MED ORDER — AMLODIPINE 1 MG/ML ORAL SUSPENSION: 10.0000 mg | Freq: Every day | ORAL | Status: DC

## 2019-06-21 NOTE — Progress Notes (Signed)
When walked in, humidifier and oxygen masked was pulled down on chest. VSS. C/O of mild SOB. Deep suction with little tan thick secretion, and clean out inner cannula. Continues to feel SOB.  Doesn't appear in respiratory distress.  Oriented to self but disoriented to time, place, situation. RT at bedside. Rapid at bedside. Reassess and oriented to person, place, situation, not time. K. Shorr notified. Will monitor and reassess in 2hrs per NP.

## 2019-06-21 NOTE — Progress Notes (Signed)
Inpatient Rehab Admissions Coordinator:   Note pt continues to have difficulty managing copious secretions requiring deep suction.  Will continue to follow for timing of rehab admission.   Shann Medal, PT, DPT Admissions Coordinator [phone] 06/21/19  5:23 PM

## 2019-06-21 NOTE — Significant Event (Signed)
Rapid Response Event Note  Overview:Called d/t respiratory distress and AMS. Time Called: 2305 Arrival Time: 2310 Event Type: Neurologic  Initial Focused Assessment: Pt laying in bed with eyes closed, alert x 1, follows commands, and moves all extremities. Pt says he is having difficulty breathing however he is very confused and appears to not be in any acute distress.  T-97.6, HR-93, BP-137/66, RR-20, SpO2-96% on .28 TC, NIH-4. Lungs diminished on L and rhonchus on R. Skin warm and dry. Pulses +2. RN suctioned trach x 2 PTA RRT with scant tan secretions. PMV placed so pt could talk to RN, this was removed after assessment since pt saying he was having trouble breathing(of note, he was saying he was having difficulty breathing prior to PMV placement). Pt's LOC is   Interventions: CBG-247 ABG-7.45/48.5/67.6/33.9> Pt increased to .35 TC Plan of Care (if not transferred): Continue to monitor pt, call Schorr, NP in 2 hours to give update on pt status(or before if pt worsens). Call RRT if further assistance needed.  Event Summary: Name of Physician Notified: Schorr, NP at 2320    at    Outcome: Stayed in room and stabalized     [place], Carren Rang

## 2019-06-21 NOTE — Plan of Care (Signed)
Pt alert and responsive. Able to make needs known. Trach site care done. Coughing up thick yellow sputum via trach. Oxygen saturation WNL. No respiratory distress noted at this time. Will continue monitoring.

## 2019-06-21 NOTE — Progress Notes (Signed)
Physical Therapy Treatment Patient Details Name: Michael Bright MRN: [id number] DOB: [birth year] Today's Date: 06/21/2019    History of Present Illness Pt adm with lt sided weakness and numbness. Pt with lt medullary infarct. Intubated 8/15 due to suspected aspiration. trach 8/21. PMH -  DM, HTN    PT Comments    Patient seen for mobility progression. Pt is making progress toward PT goals and requires min A +2 for sit to stand transfers and mod A +2 HHA for gait training. Current plan remains appropriate.    Follow Up Recommendations  CIR     Equipment Recommendations  Other (comment)(defer)    Recommendations for Other Services       Precautions / Restrictions Precautions Precautions: Fall;Other (comment) Precaution Comments: cortrak, watch BP, trach collar; PMV with therapies Restrictions Weight Bearing Restrictions: No    Mobility  Bed Mobility Overal bed mobility: Needs Assistance Bed Mobility: Supine to Sit     Supine to sit: HOB elevated;Min assist Sit to supine: HOB elevated;Min guard   General bed mobility comments: min A to elevate trunk  Transfers Overall transfer level: Needs assistance Equipment used: 2 person hand held assist Transfers: Sit to/from Stand Sit to Stand: Min assist;+2 safety/equipment         General transfer comment: cues for hand placement with bed elevated and min of 2 to power up into standing  Ambulation/Gait Ambulation/Gait assistance: +2 physical assistance;Mod assist Gait Distance (Feet): (6-8 ft X 3 trials ) Assistive device: 2 person hand held assist Gait Pattern/deviations: Decreased stance time - left;Decreased weight shift to left;Decreased step length - right;Decreased step length - left;Step-through pattern;Ataxic Gait velocity: decr   General Gait Details: assistance required for balance and weight shifting; poor proprioception L LE and weakness with increased assistance needed when stepping with R LE; cues for shorter  L step lengths at times as pt tends to over step   Stairs             Wheelchair Mobility    Modified Rankin (Stroke Patients Only) Modified Rankin (Stroke Patients Only) Pre-Morbid Rankin Score: No symptoms Modified Rankin: Moderately severe disability     Balance Overall balance assessment: Needs assistance Sitting-balance support: Feet supported;Single extremity supported Sitting balance-Leahy Scale: Good Sitting balance - Comments: supervision for sitting balance on EOB Postural control: Right lateral lean Standing balance support: Bilateral upper extremity supported Standing balance-Leahy Scale: Poor Standing balance comment: leaning while standing and +2 assist for ambulation                            Cognition Arousal/Alertness: Awake/alert Behavior During Therapy: WFL for tasks assessed/performed Overall Cognitive Status: Impaired/Different from baseline Area of Impairment: Safety/judgement                         Safety/Judgement: Decreased awareness of safety;Decreased awareness of deficits     General Comments: mildly impulsive and needing safety for cues. follows commands      Exercises      General Comments        Pertinent Vitals/Pain Pain Assessment: Faces Faces Pain Scale: No hurt    Home Living Family/patient expects to be discharged to:: Private residence Living Arrangements: Spouse/significant other Available Help at Discharge: Family;Available 24 hours/day Type of Home: House              Prior Function  PT Goals (current goals can now be found in the care plan section) Acute Rehab PT Goals Patient Stated Goal: verbalized wanting to ambulate and sit up in chair at end of session with PMSV Progress towards PT goals: Progressing toward goals    Frequency    Min 3X/week      PT Plan Current plan remains appropriate    Co-evaluation PT/OT/SLP Co-Evaluation/Treatment: Yes Reason for  Co-Treatment: Complexity of the patient's impairments (multi-system involvement);For patient/therapist safety;To address functional/ADL transfers PT goals addressed during session: Mobility/safety with mobility;Balance OT goals addressed during session: ADL's and self-care      AM-PAC PT "6 Clicks" Mobility   Outcome Measure  Help needed turning from your back to your side while in a flat bed without using bedrails?: A Lot Help needed moving from lying on your back to sitting on the side of a flat bed without using bedrails?: A Lot Help needed moving to and from a bed to a chair (including a wheelchair)?: A Lot Help needed standing up from a chair using your arms (e.g., wheelchair or bedside chair)?: A Lot Help needed to walk in hospital room?: A Lot Help needed climbing 3-5 steps with a railing? : Total 6 Click Score: 11    End of Session Equipment Utilized During Treatment: Gait belt;Oxygen Activity Tolerance: Patient tolerated treatment well Patient left: with call bell/phone within reach;in chair;with chair alarm set Nurse Communication: Mobility status PT Visit Diagnosis: Unsteadiness on feet (R26.81);Hemiplegia and hemiparesis Hemiplegia - Right/Left: Left Hemiplegia - dominant/non-dominant: Non-dominant Hemiplegia - caused by: Cerebral infarction     Time: [id number] PT Time Calculation (min) (ACUTE ONLY): 27 min  Charges:  $Gait Training: 8-22 mins                     Erline LevineKellyn Brannon Decaire, PTA Acute Rehabilitation Services Pager: [phone] Office: [phone]     Carolynne EdouardKellyn R Willies Laviolette 06/21/2019, 2:21 PM

## 2019-06-21 NOTE — Progress Notes (Signed)
Occupational Therapy Treatment Patient Details Name: Michael Bright MRN: [id number] DOB: June 14, 1956 Today's Date: 06/21/2019    History of present illness Pt adm with lt sided weakness and numbness. Pt with lt medullary infarct. Intubated 8/15 due to suspected aspiration. trach 8/21. PMH -  DM, HTN   OT comments  Pt able to tolerate PMSV during therapeutic intervention. Pt with no c/o pain and motivated for therapy this session. Pt sitting EOB and requesting suction toothbrush and wash cloth for grooming tasks with set up. Pt ambulating with mod HHA of 2 so safety and balance. Pt taking seated rest break and then ambulating 5' forward and then backwards. Pt taking too large of a step with L LE but attempts to correct before weight shift. Pt remained in recliner chair at end of session. HR increased to 120 bpm with activity. Pt continues to benefit from OT intervention.   Follow Up Recommendations  CIR;Supervision/Assistance - 24 hour    Equipment Recommendations  Other (comment)(defer to next venue of care)    Recommendations for Other Services      Precautions / Restrictions Precautions Precautions: Fall;Other (comment) Precaution Comments: cortrak, watch BP, trach collar; PMV with therapies Restrictions Weight Bearing Restrictions: No       Mobility Bed Mobility Overal bed mobility: Needs Assistance Bed Mobility: Supine to Sit     Supine to sit: HOB elevated;Min assist Sit to supine: HOB elevated;Min guard   General bed mobility comments: min A to elevate trunk  Transfers Overall transfer level: Needs assistance Equipment used: 2 person hand held assist Transfers: Sit to/from Stand Sit to Stand: Min assist;+2 safety/equipment         General transfer comment: cues for hand placement with bed elevated and min of 2 to power up into standing    Balance Overall balance assessment: Needs assistance Sitting-balance support: Feet supported;Single extremity  supported Sitting balance-Leahy Scale: Good Sitting balance - Comments: supervision for sitting balance on EOB Postural control: Right lateral lean Standing balance support: Bilateral upper extremity supported Standing balance-Leahy Scale: Poor Standing balance comment: leaning while standing and +2 assist for ambulation            ADL either performed or assessed with clinical judgement   ADL       Grooming: Wash/dry hands;Wash/dry face;Oral care;Sitting;Set up                       Cognition Arousal/Alertness: Awake/alert Behavior During Therapy: [facility] for tasks assessed/performed Overall Cognitive Status: Impaired/Different from baseline Area of Impairment: Safety/judgement      Safety/Judgement: Decreased awareness of safety;Decreased awareness of deficits     General Comments: mildly impulsive and needing safety for cues. follows commands                   Pertinent Vitals/ Pain       Pain Assessment: Faces Faces Pain Scale: No hurt  Home Living Family/patient expects to be discharged to:: Private residence Living Arrangements: Spouse/significant other Available Help at Discharge: Family;Available 24 hours/day Type of Home: House            Frequency  Min 2X/week        Progress Toward Goals  OT Goals(current goals can now be found in the care plan section)  Progress towards OT goals: Progressing toward goals  Acute Rehab OT Goals Patient Stated Goal: verbalized wanting to ambulate and sit up in chair at end of session with PMSV OT Goal Formulation:  With patient Time For Goal Achievement: 07/05/19  Plan Discharge plan remains appropriate    Co-evaluation    PT/OT/SLP Co-Evaluation/Treatment: Yes Reason for Co-Treatment: Complexity of the patient's impairments (multi-system involvement);For patient/therapist safety;To address functional/ADL transfers   OT goals addressed during session: ADL's and self-care      AM-PAC OT "6  Clicks" Daily Activity     Outcome Measure   Help from another person eating meals?: A Lot Help from another person taking care of personal grooming?: A Lot Help from another person toileting, which includes using toliet, bedpan, or urinal?: Total Help from another person bathing (including washing, rinsing, drying)?: A Lot Help from another person to put on and taking off regular upper body clothing?: A Lot Help from another person to put on and taking off regular lower body clothing?: A Lot 6 Click Score: 11    End of Session Equipment Utilized During Treatment: Rolling walker;Oxygen  OT Visit Diagnosis: Unsteadiness on feet (R26.81);Other abnormalities of gait and mobility (R26.89);Muscle weakness (generalized) (M62.81);History of falling (Z91.81);Other symptoms and signs involving the nervous system (R29.898)   Activity Tolerance Patient limited by fatigue   Patient Left in chair;with call bell/phone within reach;with chair alarm set   Nurse Communication Mobility status        Time: [id number] OT Time Calculation (min): 31 min  Charges: OT General Charges $OT Visit: 1 Visit OT Treatments $Self Care/Home Management : 8-22 mins   Darious Rehman P, MS, OTR/L 06/21/2019, 12:54 PM

## 2019-06-21 NOTE — Progress Notes (Signed)
PROGRESS NOTE    Michael Bright   QIH:[id number]  DOB: Aug 06, 1956  DOA: 05/30/2019 PCP: Patient, No Pcp Per   Brief Narrative:  Michael Bright is a 63 year old male with diabetes mellitus type 2 who stopped taking his medications and seeing his doctors over 10 years ago.  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 Complicated by dysphagia, aspiration pneumonia and acute hypoxic respiratory failure requiring intubation on 8/15 and mechanical ventilation. He was unable to be weaned from the ventilator and underwent a tracheostomy on 8/21. He was transferred to triad hospitalist service on 8/26.  Subjective: He has no complaints    Assessment & Plan:   Principal Problem:   Acute CVA (cerebrovascular accident)  -With left lateral medullary syndrome, dysarthria, left facial droop, double vision, unsteady gait -Has been evaluated by the stroke team - Plan to continue aspirin and Plavix a months and then aspirin alone -He will go to: Inpatient rehab when respiratory secretions have decreased  Active Problems:    Acute respiratory failure  Dysphasia with aspiration pneumonia   PEG tube -He has recuperated from the pneumonia but continues to have a large amount of sputum production from trach -We will try a scopolamine patch-he is already on Robinul -Continue trach with Passy-Muir valve -Continue PEG tube feeds which he is tolerating well    Essential hypertension -Add amlodipine per tube    Type 2 diabetes mellitus with hyperlipidemia (HCC) -A1c 10.6 on 05/30/2019 -Continue Lantus and subcu insulin    Time spent in minutes: 35 DVT prophylaxis: Lovenox Code Status: Full code Family Communication:  Disposition Plan: CIR Consultants:   Neurology  Pulmonary critical care Procedures:   PEG tube  Ileostomy  Intubation and extubation Antimicrobials:  Anti-infectives (From  admission, onward)   Start     Dose/Rate Route Frequency Ordered Stop   06/19/19 1800  cephALEXin (KEFLEX) 250 MG/5ML suspension 250 mg     250 mg Per Tube Every 6 hours 06/19/19 1539     06/14/19 1500  ceFAZolin (ANCEF) IVPB 1 g/50 mL premix  Status:  Discontinued     1 g 100 mL/hr over 30 Minutes Intravenous Every 8 hours 06/14/19 1313 06/14/19 1333   06/14/19 1500  ceFAZolin (ANCEF) IVPB 2g/100 mL premix  Status:  Discontinued     2 g 200 mL/hr over 30 Minutes Intravenous Every 8 hours 06/14/19 1333 06/19/19 1539   06/13/19 2200  vancomycin (VANCOCIN) 1,500 mg in sodium chloride 0.9 % 500 mL IVPB  Status:  Discontinued     1,500 mg 250 mL/hr over 120 Minutes Intravenous Every 12 hours 06/13/19 0912 06/14/19 1313   06/13/19 0915  piperacillin-tazobactam (ZOSYN) IVPB 4.5 g  Status:  Discontinued     4.5 g 200 mL/hr over 30 Minutes Intravenous Every 8 hours 06/13/19 0900 06/14/19 1313   06/13/19 0915  vancomycin (VANCOCIN) 2,000 mg in sodium chloride 0.9 % 500 mL IVPB     2,000 mg 250 mL/hr over 120 Minutes Intravenous  Once 06/13/19 0912 06/14/19 0809   06/05/19 1630  ceFAZolin (ANCEF) IVPB 2g/100 mL premix     2 g 200 mL/hr over 30 Minutes Intravenous Every 8 hours 06/05/19 1628 06/10/19 1856   06/03/19 0100  Ampicillin-Sulbactam (UNASYN) 3 g in sodium chloride 0.9 % 100 mL IVPB  Status:  Discontinued     3 g 200 mL/hr over 30 Minutes Intravenous  Every 6 hours 06/03/19 0008 06/05/19 1608       Objective: Vitals:   06/21/19 0835 06/21/19 1136 06/21/19 1200 06/21/19 1520  BP: (!) 151/92  138/79 (!) 153/92  Pulse: 66 (!) 114 (!) 101 91  Resp: _0 Temp:   97.9 F (36.6 C)   TempSrc:   Axillary   SpO2: 92% 93% 99% 97%  Weight:      Height:        Intake/Output Summary (Last 24 hours) at 06/21/2019 1618 Last data filed at 06/21/2019 4332 Gross per 24 hour  Intake 1202 ml  Output 750 ml  Net 452 ml   Filed Weights   06/20/19 0429 06/20/19 2345 06/21/19 0422   Weight: (!) 145.2 kg (!) 146 kg (!) 142.9 kg    Examination: General exam: Appears comfortable  HEENT: PERRLA, oral mucosa moist, no sclera icterus or thrush-tracheostomy present-noted to have pale yellow secretions Respiratory system: Clear to auscultation. Respiratory effort normal. Cardiovascular system: S1 & S2 heard, RRR.   Gastrointestinal system: Abdomen soft, non-tender, nondistended. Normal bowel sounds.  PEG tube present. Central nervous system: Alert and oriented. No focal neurological deficits. Extremities: No cyanosis, clubbing or edema Skin: No rashes or ulcers Psychiatry:  Mood & affect appropriate.     Data Reviewed: I have personally reviewed following labs and imaging studies  CBC: Recent Labs  Lab 06/15/19 0420 06/16/19 0347 06/17/19 0533 06/18/19 0603 06/21/19 0330  WBC 11.6* 10.2 9.2 8.0 8.0  NEUTROABS 7.3 6.1 4.9 4.1 4.3  HGB 12.9* 12.1* 12.0* 12.0* 12.3*  HCT 41.1 37.8* 39.3 38.3* 38.7*  MCV 96.9 94.0 97.8 95.3 94.9  PLT 402* 372 404* 385 951   Basic Metabolic Panel: Recent Labs  Lab 06/15/19 0420 06/16/19 0347 06/17/19 0533 06/18/19 0603 06/21/19 0330  NA 144 144 144 143 137  K 3.5 3.8 3.6 3.7 4.2  CL 107 106 105 101 95*  CO2 26 28 32 33* 32  GLUCOSE 213* 206* 209* 174* 222*  BUN 27* 24* _1 CREATININE 0.88 0.80 0.71 0.64 0.77  CALCIUM 8.7* 8.5* 8.5* 8.6* 8.6*   GFR: Estimated Creatinine Clearance: 140.4 mL/min (by C-G formula based on SCr of 0.77 mg/dL). Liver Function Tests: No results for input(s): AST, ALT, ALKPHOS, BILITOT, PROT, ALBUMIN in the last 168 hours. No results for input(s): LIPASE, AMYLASE in the last 168 hours. No results for input(s): AMMONIA in the last 168 hours. Coagulation Profile: No results for input(s): INR, PROTIME in the last 168 hours. Cardiac Enzymes: No results for input(s): CKTOTAL, CKMB, CKMBINDEX, TROPONINI in the last 168 hours. BNP (last 3 results) No results for input(s): PROBNP in the last  8760 hours. HbA1C: No results for input(s): HGBA1C in the last 72 hours. CBG: Recent Labs  Lab 06/20/19 2119 06/20/19 2342 06/21/19 0335 06/21/19 0739 06/21/19 1057  GLUCAP 197* 226* 197* 208* 200*   Lipid Profile: No results for input(s): CHOL, HDL, LDLCALC, TRIG, CHOLHDL, LDLDIRECT in the last 72 hours. Thyroid Function Tests: No results for input(s): TSH, T4TOTAL, FREET4, T3FREE, THYROIDAB in the last 72 hours. Anemia Panel: No results for input(s): VITAMINB12, FOLATE, FERRITIN, TIBC, IRON, RETICCTPCT in the last 72 hours. Urine analysis:    Component Value Date/Time   COLORURINE YELLOW 05/30/2019 0151   APPEARANCEUR HAZY (A) 05/30/2019 0151   LABSPEC 1.023 05/30/2019 0151   PHURINE 5.0 05/30/2019 0151   GLUCOSEU >=500 (A) 05/30/2019 0151   HGBUR MODERATE (A) 05/30/2019 0151  BILIRUBINUR NEGATIVE 05/30/2019 0151   KETONESUR 5 (A) 05/30/2019 0151   PROTEINUR 30 (A) 05/30/2019 0151   UROBILINOGEN 0.2 01/20/2008 1222   NITRITE NEGATIVE 05/30/2019 0151   LEUKOCYTESUR NEGATIVE 05/30/2019 0151   Sepsis Labs: _0 (procalcitonin:4,lacticidven:4) ) Recent Results (from the past 240 hour(s))  Culture, respiratory (non-expectorated)     Status: None   Collection Time: 06/13/19  8:41 AM   Specimen: Tracheal Aspirate; Respiratory  Result Value Ref Range Status   Specimen Description TRACHEAL ASPIRATE  Final   Special Requests NONE  Final   Gram Stain   Final    MODERATE WBC PRESENT, PREDOMINANTLY PMN MODERATE GRAM POSITIVE COCCI IN CLUSTERS FEW GRAM NEGATIVE RODS FEW GRAM POSITIVE RODS    Culture   Final    Consistent with normal respiratory flora. Performed at [facility], [place] [address] [place], [place] [address]    Report Status 06/15/2019 FINAL  Final  Culture, blood (routine x 2)     Status: None   Collection Time: 06/13/19  9:12 AM   Specimen: BLOOD  Result Value Ref Range Status   Specimen Description BLOOD LEFT ANTECUBITAL  Final   Special  Requests   Final    BOTTLES DRAWN AEROBIC AND ANAEROBIC Blood Culture adequate volume   Culture   Final    NO GROWTH 5 DAYS Performed at [facility], [place] [address], [place], [place] [address]    Report Status 06/18/2019 FINAL  Final  Culture, blood (routine x 2)     Status: None   Collection Time: 06/13/19  9:22 AM   Specimen: BLOOD  Result Value Ref Range Status   Specimen Description BLOOD LEFT ANTECUBITAL  Final   Special Requests   Final    BOTTLES DRAWN AEROBIC AND ANAEROBIC Blood Culture adequate volume   Culture   Final    NO GROWTH 5 DAYS Performed at [facility], [place] [address], [place], [place] [address]    Report Status 06/18/2019 FINAL  Final         Radiology Studies: No results found.    Scheduled Meds: . aspirin  81 mg Per Tube Daily  . atorvastatin  40 mg Per Tube q1800  . cephALEXin  250 mg Per Tube Q6H  . chlorhexidine gluconate (MEDLINE KIT)  15 mL Mouth Rinse BID  . Chlorhexidine Gluconate Cloth  6 each Topical Daily  . clopidogrel  75 mg Per Tube Daily  . enoxaparin (LOVENOX) injection  70 mg Subcutaneous Daily  . famotidine  20 mg Per Tube BID  . feeding supplement (PRO-STAT SUGAR FREE 64)  30 mL Per Tube TID  . finasteride  5 mg Oral Daily  . glycopyrrolate  1 mg Per Tube BID  . insulin aspart  0-15 Units Subcutaneous Q4H  . insulin glargine  20 Units Subcutaneous Daily  . insulin starter kit- pen needles  1 kit Other Once  . mouth rinse  15 mL Mouth Rinse 10 times per day  . QUEtiapine  100 mg Per Tube QHS  . QUEtiapine  50 mg Per Tube Daily  . sodium chloride flush  10-40 mL Intracatheter Q12H   Continuous Infusions: . feeding supplement (OSMOLITE 1.2 CAL) 60 mL/hr at 06/20/19 1758     LOS: 22 days      Debbe Odea, MD [facility] Pager: [email or web address] Password [email or web address] 06/21/2019, 4:18 PM

## 2019-06-21 NOTE — Progress Notes (Signed)
SLP Cancellation Note  Patient Details Name: Michael Bright MRN: [id number] DOB: Aug 05, 1956   Cancelled treatment:       Reason Eval/Treat Not Completed: Other (comment). Pt unable to tolerate PMSV trials today due to continued significant secretions which pt is having difficulty managing. RN reports efforts made to reduce secretions for better management and increased PMSV tolerance. Will continue to follow as pt tolerates.  Celia B. Quentin O[facility], [place] Speech Language Pathologist [phone]  Shonna Chock 06/21/2019, 1:47 PM

## 2019-06-22 ENCOUNTER — Inpatient Hospital Stay (HOSPITAL_COMMUNITY): Payer: BC Managed Care – PPO

## 2019-06-22 LAB — GLUCOSE, CAPILLARY
Glucose-Capillary: 194 mg/dL — ABNORMAL HIGH (ref 70–99)
Glucose-Capillary: 194 mg/dL — ABNORMAL HIGH (ref 70–99)
Glucose-Capillary: 176 mg/dL — ABNORMAL HIGH (ref 70–99)
Glucose-Capillary: 230 mg/dL — ABNORMAL HIGH (ref 70–99)
Glucose-Capillary: 230 mg/dL — ABNORMAL HIGH (ref 70–99)
Glucose-Capillary: 153 mg/dL — ABNORMAL HIGH (ref 70–99)
Glucose-Capillary: 168 mg/dL — ABNORMAL HIGH (ref 70–99)

## 2019-06-22 MED ORDER — GLYCOPYRROLATE 1 MG PO TABS
2.0000 mg | ORAL_TABLET | Freq: Three times a day (TID) | ORAL | Status: DC
  Filled 2019-06-22 (×2): qty 2

## 2019-06-22 MED ORDER — GLYCOPYRROLATE 1 MG PO TABS
2.0000 mg | ORAL_TABLET | Freq: Two times a day (BID) | ORAL | Status: DC
  Administered 2019-06-22 – 2019-06-27 (×10): 2 mg
  Filled 2019-06-22 (×11): qty 2

## 2019-06-22 NOTE — Progress Notes (Signed)
Inpatient Diabetes Program Recommendations  AACE/ADA: New Consensus Statement on Inpatient Glycemic Control (2015)  Target Ranges:  Prepandial:   less than 140 mg/dL      Peak postprandial:   less than 180 mg/dL (1-2 hours)      Critically ill patients:  140 - 180 mg/dL   Lab Results  Component Value Date   GLUCAP 230 (H) 06/22/2019   HGBA1C 10.6 (H) 05/30/2019    Review of Glycemic Control Results for Michael Bright, Michael Bright (MRN [id number]) as of 06/22/2019 09:10  Ref. Range 06/21/2019 07:39 06/21/2019 10:57 06/21/2019 16:22 06/21/2019 23:27 06/22/2019 03:53 06/22/2019 08:25  Glucose-Capillary Latest Ref Range: 70 - 99 mg/dL 208 (H) 200 (H) 233 (H) 247 (H) 176 (H) 230 (H)   Diabetes history: DM 2 Outpatient Diabetes medications: None Current orders for Inpatient glycemic control:  Lantus 20 units Daily Novolog 0-15 units Q4 hours  Osmolite 60 ml/hour  Inpatient Diabetes Program Recommendations:    Glucose trends elevated in the 200's.  Consider Novolog 4 units Q4 hours Tube Feed Coverage  (Do not give if Tube Feeds are stopped or held)  Thanks,  Tama Headings RN, MSN, BC-ADM Inpatient Diabetes Coordinator Team Pager [phone] (8a-5p)

## 2019-06-22 NOTE — Progress Notes (Signed)
PROGRESS NOTE    Michael Bright   AST:[id number]  DOB: [birth year]  DOA: 05/30/2019 PCP: Patient, No Pcp Per   Brief Narrative:  Michael Bright is a 63 year old male with uncontrolled diabetes mellitus type 2 who stopped taking his medications and seeing his doctors over 10 years ago.  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 Complicated by dysphagia, aspiration pneumonia and acute hypoxic respiratory failure requiring intubation on 8/15 and mechanical ventilation. He was unable to be weaned from the ventilator and underwent a tracheostomy on 8/21. He was transferred to triad hospitalist service on 8/26.  Subjective: No complaints.     Assessment & Plan:   Principal Problem:   Acute CVA (cerebrovascular accident)  -With left lateral medullary syndrome, dysphagia, dysarthria, left facial droop, double vision, unsteady gait -Has been evaluated by the stroke team - Plan to continue aspirin and Plavix a months and then aspirin alone -He will go to: Inpatient rehab when respiratory secretions have decreased  Active Problems:    Acute respiratory failure  Dysphasia due to CVA with aspiration pneumonia   PEG tube - Trach placed on 8/21- the is meant to be chronic per PCCM -He has recuperated from the pneumonia but continues to have a large amount of sputum production from trach - I added a scopolamine patch yesterday -he is already on Robinul - will check a CXR today and if no pneumonia, will see if Robinul can be increased -Continue trach with Passy-Muir valve -Continue PEG tube feeds which he is tolerating well    Essential hypertension -Added amlodipine per tube    Type 2 diabetes mellitus with hyperlipidemia (HCC) -A1c 10.6 on 05/30/2019 -Continue Lantus and subcu insulin    Time spent in minutes: 35 DVT prophylaxis: Lovenox Code Status: Full code Family Communication:   Disposition Plan: CIR when he does not have much secretions from trach Consultants:   Neurology  Pulmonary critical care Procedures:   PEG tube  Ileostomy  Intubation and extubation Antimicrobials:  Anti-infectives (From admission, onward)   Start     Dose/Rate Route Frequency Ordered Stop   06/19/19 1800  cephALEXin (KEFLEX) 250 MG/5ML suspension 250 mg     250 mg Per Tube Every 6 hours 06/19/19 1539     06/14/19 1500  ceFAZolin (ANCEF) IVPB 1 g/50 mL premix  Status:  Discontinued     1 g 100 mL/hr over 30 Minutes Intravenous Every 8 hours 06/14/19 1313 06/14/19 1333   06/14/19 1500  ceFAZolin (ANCEF) IVPB 2g/100 mL premix  Status:  Discontinued     2 g 200 mL/hr over 30 Minutes Intravenous Every 8 hours 06/14/19 1333 06/19/19 1539   06/13/19 2200  vancomycin (VANCOCIN) 1,500 mg in sodium chloride 0.9 % 500 mL IVPB  Status:  Discontinued     1,500 mg 250 mL/hr over 120 Minutes Intravenous Every 12 hours 06/13/19 0912 06/14/19 1313   06/13/19 0915  piperacillin-tazobactam (ZOSYN) IVPB 4.5 g  Status:  Discontinued     4.5 g 200 mL/hr over 30 Minutes Intravenous Every 8 hours 06/13/19 0900 06/14/19 1313   06/13/19 0915  vancomycin (VANCOCIN) 2,000 mg in sodium chloride 0.9 % 500 mL IVPB     2,000 mg 250 mL/hr over 120 Minutes Intravenous  Once 06/13/19 0912 06/14/19 0809   06/05/19 1630  ceFAZolin (ANCEF) IVPB 2g/100 mL premix     2 g 200  mL/hr over 30 Minutes Intravenous Every 8 hours 06/05/19 1628 06/10/19 1856   06/03/19 0100  Ampicillin-Sulbactam (UNASYN) 3 g in sodium chloride 0.9 % 100 mL IVPB  Status:  Discontinued     3 g 200 mL/hr over 30 Minutes Intravenous Every 6 hours 06/03/19 0008 06/05/19 1608       Objective: Vitals:   06/22/19 0900 06/22/19 1000 06/22/19 1200 06/22/19 1214  BP:   119/71   Pulse: 75 86 93 93  Resp: (!) 0 (!) 26 (!) 22 (!) 24  Temp:   98.1 F (36.7 C)   TempSrc:   Axillary   SpO2: 97% 98% 98%   Weight:      Height:         Intake/Output Summary (Last 24 hours) at 06/22/2019 1459 Last data filed at 06/22/2019 1006 Gross per 24 hour  Intake -  Output 1350 ml  Net -1350 ml   Filed Weights   06/20/19 0429 06/20/19 2345 06/21/19 0422  Weight: (!) 145.2 kg (!) 146 kg (!) 142.9 kg    Examination: General exam: Appears comfortable  HEENT: PERRLA, oral mucosa moist, no sclera icterus or thrush-tracheostomy present-noted to have pale yellow secretions Respiratory system: Clear to auscultation. Respiratory effort normal. Cardiovascular system: S1 & S2 heard, RRR.   Gastrointestinal system: Abdomen soft, non-tender, nondistended. Normal bowel sounds.  PEG tube present. Central nervous system: Alert and oriented. No focal neurological deficits. Extremities: No cyanosis, clubbing or edema Skin: No rashes or ulcers Psychiatry:  Mood & affect appropriate.     Data Reviewed: I have personally reviewed following labs and imaging studies  CBC: Recent Labs  Lab 06/16/19 0347 06/17/19 0533 06/18/19 0603 06/21/19 0330  WBC 10.2 9.2 8.0 8.0  NEUTROABS 6.1 4.9 4.1 4.3  HGB 12.1* 12.0* 12.0* 12.3*  HCT 37.8* 39.3 38.3* 38.7*  MCV 94.0 97.8 95.3 94.9  PLT 372 404* 385 672   Basic Metabolic Panel: Recent Labs  Lab 06/16/19 0347 06/17/19 0533 06/18/19 0603 06/21/19 0330  NA 144 144 143 137  K 3.8 3.6 3.7 4.2  CL 106 105 101 95*  CO2 28 32 33* 32  GLUCOSE 206* 209* 174* 222*  BUN 24* _0 CREATININE 0.80 0.71 0.64 0.77  CALCIUM 8.5* 8.5* 8.6* 8.6*   GFR: Estimated Creatinine Clearance: 140.4 mL/min (by C-G formula based on SCr of 0.77 mg/dL). Liver Function Tests: No results for input(s): AST, ALT, ALKPHOS, BILITOT, PROT, ALBUMIN in the last 168 hours. No results for input(s): LIPASE, AMYLASE in the last 168 hours. No results for input(s): AMMONIA in the last 168 hours. Coagulation Profile: No results for input(s): INR, PROTIME in the last 168 hours. Cardiac Enzymes: No results for input(s):  CKTOTAL, CKMB, CKMBINDEX, TROPONINI in the last 168 hours. BNP (last 3 results) No results for input(s): PROBNP in the last 8760 hours. HbA1C: No results for input(s): HGBA1C in the last 72 hours. CBG: Recent Labs  Lab 06/21/19 2110 06/21/19 2327 06/22/19 0353 06/22/19 0825 06/22/19 1158  GLUCAP 194*  194* 247* 176* 230* 230*   Lipid Profile: No results for input(s): CHOL, HDL, LDLCALC, TRIG, CHOLHDL, LDLDIRECT in the last 72 hours. Thyroid Function Tests: No results for input(s): TSH, T4TOTAL, FREET4, T3FREE, THYROIDAB in the last 72 hours. Anemia Panel: No results for input(s): VITAMINB12, FOLATE, FERRITIN, TIBC, IRON, RETICCTPCT in the last 72 hours. Urine analysis:    Component Value Date/Time   COLORURINE YELLOW 05/30/2019 0151   APPEARANCEUR HAZY (A) 05/30/2019  0151   LABSPEC 1.023 05/30/2019 0151   PHURINE 5.0 05/30/2019 0151   GLUCOSEU >=500 (A) 05/30/2019 0151   HGBUR MODERATE (A) 05/30/2019 0151   BILIRUBINUR NEGATIVE 05/30/2019 0151   KETONESUR 5 (A) 05/30/2019 0151   PROTEINUR 30 (A) 05/30/2019 0151   UROBILINOGEN 0.2 01/20/2008 1222   NITRITE NEGATIVE 05/30/2019 0151   LEUKOCYTESUR NEGATIVE 05/30/2019 0151   Sepsis Labs: _0 (procalcitonin:4,lacticidven:4) ) Recent Results (from the past 240 hour(s))  Culture, respiratory (non-expectorated)     Status: None   Collection Time: 06/13/19  8:41 AM   Specimen: Tracheal Aspirate; Respiratory  Result Value Ref Range Status   Specimen Description TRACHEAL ASPIRATE  Final   Special Requests NONE  Final   Gram Stain   Final    MODERATE WBC PRESENT, PREDOMINANTLY PMN MODERATE GRAM POSITIVE COCCI IN CLUSTERS FEW GRAM NEGATIVE RODS FEW GRAM POSITIVE RODS    Culture   Final    Consistent with normal respiratory flora. Performed at [facility], [place] [address], [place], [place] [address]    Report Status 06/15/2019 FINAL  Final  Culture, blood (routine x 2)     Status: None   Collection Time:  06/13/19  9:12 AM   Specimen: BLOOD  Result Value Ref Range Status   Specimen Description BLOOD LEFT ANTECUBITAL  Final   Special Requests   Final    BOTTLES DRAWN AEROBIC AND ANAEROBIC Blood Culture adequate volume   Culture   Final    NO GROWTH 5 DAYS Performed at [facility], [place] [address] [place], [place] [address]    Report Status 06/18/2019 FINAL  Final  Culture, blood (routine x 2)     Status: None   Collection Time: 06/13/19  9:22 AM   Specimen: BLOOD  Result Value Ref Range Status   Specimen Description BLOOD LEFT ANTECUBITAL  Final   Special Requests   Final    BOTTLES DRAWN AEROBIC AND ANAEROBIC Blood Culture adequate volume   Culture   Final    NO GROWTH 5 DAYS Performed at [facility], [place] [address], [place], [place] [address]    Report Status 06/18/2019 FINAL  Final         Radiology Studies: No results found.    Scheduled Meds: . amLODipine  10 mg Oral Daily  . aspirin  81 mg Per Tube Daily  . atorvastatin  40 mg Per Tube q1800  . cephALEXin  250 mg Per Tube Q6H  . chlorhexidine gluconate (MEDLINE KIT)  15 mL Mouth Rinse BID  . Chlorhexidine Gluconate Cloth  6 each Topical Daily  . clopidogrel  75 mg Per Tube Daily  . enoxaparin (LOVENOX) injection  70 mg Subcutaneous Daily  . famotidine  20 mg Per Tube BID  . feeding supplement (PRO-STAT SUGAR FREE 64)  30 mL Per Tube TID  . finasteride  5 mg Oral Daily  . glycopyrrolate  1 mg Per Tube BID  . insulin aspart  0-15 Units Subcutaneous Q4H  . insulin glargine  20 Units Subcutaneous Daily  . insulin starter kit- pen needles  1 kit Other Once  . mouth rinse  15 mL Mouth Rinse 10 times per day  . QUEtiapine  100 mg Per Tube QHS  . QUEtiapine  50 mg Per Tube Daily  . scopolamine  1 patch Transdermal Q72H  . sodium chloride flush  10-40 mL Intracatheter Q12H   Continuous Infusions: . feeding supplement (OSMOLITE 1.2 CAL) 60 mL/hr  at 06/20/19 1758     LOS: 23 days      Debbe Odea, MD [facility] Pager: [email or web address] Password [email or web address] 06/22/2019, 2:59 PM

## 2019-06-22 NOTE — Progress Notes (Signed)
Nutrition Follow-up  DOCUMENTATION CODES:   Morbid obesity  INTERVENTION:  Continue Osmolite 1.2 formula via Cortrak NGT at goal rate of 60 ml/hr  Continue 30 ml Prostat TID per tube.  Tube feeding regimen to provide 2028 kcal (100% of needs), 125 grams protein, and 1167 ml free water.  NUTRITION DIAGNOSIS:   Inadequate oral intake related to dysphagia as evidenced by NPO status; ongoing  GOAL:   Patient will meet greater than or equal to 90% of their needs; met with TF  MONITOR:   Skin, Weight trends, Labs, I & O's, TF tolerance  REASON FOR ASSESSMENT:   Consult Enteral/tube feeding initiation and management  ASSESSMENT:   63 yo male admitted on 8/11 with L medulla CVA with dysphagia. PMH includes DM, HTN  8/12 Transferred to ICUfor respiratory decompensation, unable to clear secretions 8/13 Cortrak inserted by Diagnostics, TF initiated 8/14 Cortrak replaced (gastric)  8/15 intubated from presumed aspiration 8/21 trach placed 8/22 transitioned to trach collar     Per MD, plans for CIR when secretions decrease. Pt continues NPO status and has been tolerating her tube feeds well. RD to continue with current orders and monitor for tolerance.   Labs and medications reviewed.   Diet Order:   Diet Order            Diet NPO time specified  Diet effective midnight              EDUCATION NEEDS:   Not appropriate for education at this time  Skin:  Skin Assessment: Reviewed RN Assessment Skin Integrity Issues:: Other (Comment) Other: N/A  Last BM:  8/31  Height:   Ht Readings from Last 1 Encounters:  06/03/19 6' (1.829 m)    Weight:   Wt Readings from Last 1 Encounters:  06/21/19 (!) 142.9 kg    Ideal Body Weight:  80.9 kg  BMI:  Body mass index is 42.73 kg/m.  Estimated Nutritional Needs:   Kcal:  2000-2300  Protein:  110-130 grams  Fluid:  2 L    Corrin Parker, MS, RD, LDN Pager # [phone] After hours/ weekend pager #  [phone]

## 2019-06-22 NOTE — Progress Notes (Signed)
At [place], Pt is oriented and following command. Denies SOB. Updated NP.

## 2019-06-23 LAB — GLUCOSE, CAPILLARY
Glucose-Capillary: 177 mg/dL — ABNORMAL HIGH (ref 70–99)
Glucose-Capillary: 182 mg/dL — ABNORMAL HIGH (ref 70–99)
Glucose-Capillary: 189 mg/dL — ABNORMAL HIGH (ref 70–99)
Glucose-Capillary: 189 mg/dL — ABNORMAL HIGH (ref 70–99)
Glucose-Capillary: 200 mg/dL — ABNORMAL HIGH (ref 70–99)
Glucose-Capillary: 208 mg/dL — ABNORMAL HIGH (ref 70–99)
Glucose-Capillary: 208 mg/dL — ABNORMAL HIGH (ref 70–99)

## 2019-06-23 MED ORDER — GLYCOPYRROLATE 0.2 MG/ML IJ SOLN
0.1000 mg | Freq: Three times a day (TID) | INTRAMUSCULAR | Status: DC
  Administered 2019-06-23 – 2019-06-27 (×12): 0.1 mg via INTRAVENOUS
  Filled 2019-06-23 (×12): qty 1

## 2019-06-23 MED ORDER — INSULIN GLARGINE 100 UNIT/ML ~~LOC~~ SOLN
23.0000 [IU] | Freq: Every day | SUBCUTANEOUS | Status: DC
  Administered 2019-06-23 – 2019-06-24 (×2): 23 [IU] via SUBCUTANEOUS
  Filled 2019-06-23 (×2): qty 0.23

## 2019-06-23 MED ORDER — BUDESONIDE 0.25 MG/2ML IN SUSP
0.2500 mg | Freq: Two times a day (BID) | RESPIRATORY_TRACT | Status: DC
  Administered 2019-06-23 – 2019-06-27 (×8): 0.25 mg via RESPIRATORY_TRACT
  Filled 2019-06-23 (×8): qty 2

## 2019-06-23 NOTE — Progress Notes (Signed)
Inpatient Rehab Admissions Coordinator:   Note pt continuing to improve with secretions.  Will follow over the weekend and Jhonnie Garner will follow up on Monday.   Shann Medal, PT, DPT Admissions Coordinator [phone] 06/23/19  11:03 AM

## 2019-06-23 NOTE — TOC Initial Note (Signed)
Transition of Care [facility]) - Initial/Assessment Note    Patient Details  Name: Michael Bright MRN: [id number] Date of Birth: 24-Apr-1956  [facility]) CM/SW Contact:    Pollie Friar, RN Phone Number: 06/23/2019, 1:46 PM  Clinical Narrative:                 Plan is for CIR once pts secretions from trach are better controlled and insurance approves.  TOC following.  Expected Discharge Plan: IP Rehab Facility Barriers to Discharge: Continued Medical Work up   Patient Goals and CMS Choice        Expected Discharge Plan and Services Expected Discharge Plan: [place]                                              Prior Living Arrangements/Services                       Activities of Daily Living Home Assistive Devices/Equipment: None ADL Screening (condition at time of admission) Patient's cognitive ability adequate to safely complete daily activities?: Yes Is the patient deaf or have difficulty hearing?: No Does the patient have difficulty seeing, even when wearing glasses/contacts?: No Does the patient have difficulty concentrating, remembering, or making decisions?: No Patient able to express need for assistance with ADLs?: Yes Does the patient have difficulty dressing or bathing?: Yes Independently performs ADLs?: No Communication: Independent Dressing (OT): Needs assistance Is this a change from baseline?: Change from baseline, expected to last <3days Grooming: Needs assistance Is this a change from baseline?: Change from baseline, expected to last <3 days Feeding: Independent Bathing: Needs assistance Is this a change from baseline?: Change from baseline, expected to last <3 days Toileting: Needs assistance Is this a change from baseline?: Change from baseline, expected to last <3 days In/Out Bed: Needs assistance Is this a change from baseline?: Change from baseline, expected to last <3 days Walks in Home: Independent Does  the patient have difficulty walking or climbing stairs?: No Weakness of Legs: Left Weakness of Arms/Hands: Left  Permission Sought/Granted                  Emotional Assessment              Admission diagnosis:  Diplopia [H53.2] Renal failure [N19] Weakness [R53.1] CVA (cerebral vascular accident) ([place]) [I63.9] Cerebrovascular accident (CVA), unspecified mechanism ([place]) [I63.9] Acute CVA (cerebrovascular accident) [facility]) [I63.9] Patient Active Problem List   Diagnosis Date Noted  . Aspiration pneumonia ([place]) 06/14/2019  . Essential hypertension 06/14/2019  . Type 2 diabetes mellitus with hyperlipidemia ([place]) 06/14/2019  . Acute respiratory failure ([place])   . Acute CVA (cerebrovascular accident) ([place]) 05/30/2019  . 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 Dysphagia 05/30/2019  . Renal insufficiency 05/30/2019  . Diplopia   . Weakness    PCP:  Patient, No Pcp Per Pharmacy:   [id number] - [place], [place] [address] [place] [address] Phone: [phone] Fax: [phone]     Social Determinants of Health (SDOH) Interventions    Readmission Risk Interventions No flowsheet data found.

## 2019-06-23 NOTE — Progress Notes (Signed)
  Speech Language Pathology Treatment: Dysphagia;Passy Muir Speaking valve  Patient Details Name: Michael Bright MRN: [id number] DOB: 08/31/1956 Today's Date: 06/23/2019 Time: 4174-0814 SLP Time Calculation (min) (ACUTE ONLY): 23 min  Assessment / Plan / Recommendation Clinical Impression  Pt was seen for skilled ST targeting goals for dysphagia and tolerance of PMSV.  Per report from nursing, pt has been better able to manage his secretions since last ST visit and appears overall more stable from a respiratory standpoint.  Upon therapist's arrival, pt was attempting to talk around his trach and was able to achieve some voicing prior to placement of valve.  Pt wore PMSV for the duration of today's therapy session with the exception of 2 valve removals to assess for air trapping.  Pt had no evidence of air trapping on valve removal and vitals remained WFL while valve was placed.  Pt's voice was consistently wet throughout session but he could clear it briefly with cues for volitional cough followed by hard swallow.  Pt also needed mod verbal cues to increase vocal intensity to achieve intelligibility at the sentence level.   I would recommend that pt wear valve with staff supervision and during therapies, please remove valve when pt is alone in the room.  SLP administered 1 small teaspoon of ice chips to assess readiness for objective study.  Pt had immediate increase in coughing and wetness with limited trial which took >1 minute to clear.  Do not feel that pt is yet ready for instrumental study.  Continue per current plan of care.    HPI HPI: Michael Bright is a 63 y.o. male with hx of myotonic dystrophy 2 (DM2) and diabetes who presented with difficulty swallowing. Pt was transferred to ICU 8/11 for acute hypoxic respiratory failure most likely 2/2 aspiration in setting of stroke, resulting in aspiration PNA. L side hemiparesis, and diplopia. 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 Evaluated for dysphagia  8/12 and cog/ling with diagnoses of severe oropharyngeal dysphagia (NPO) and mod dysarthria. 8/14 Intubated 8/15, evaluated by SLP 8/19 with recommendation for trach. Trach placed 8/21 with SLP recommendation for PMV same day. Experienced worsening paranoia and delirium 8/23. Pt on tube feed, experiencing nausa and emesis 8/24.         SLP Plan  Continue with current plan of care       Recommendations  Diet recommendations: NPO Medication Administration: Via alternative means      Patient may use Passy-Muir Speech Valve: Intermittently with supervision;During all therapies with supervision PMSV Supervision: Full         Oral Care Recommendations: Oral care QID Follow up Recommendations: Inpatient Rehab SLP Visit Diagnosis: Aphonia (R49.1);Dysphagia, unspecified (R13.10) Plan: Continue with current plan of care       GO                Rumaisa Schnetzer, Selinda Orion 06/23/2019, 9:07 AM

## 2019-06-23 NOTE — Progress Notes (Addendum)
PROGRESS NOTE    Michael Bright   DDU:[id number]  DOB: 07-Oct-1956  DOA: 05/30/2019 PCP: Patient, No Pcp Per   Brief Narrative:  Michael Bright is a 63 year old male with uncontrolled diabetes mellitus type 2 who stopped taking his medications and seeing his doctors over 10 years ago.  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 Complicated by dysphagia, aspiration pneumonia and acute hypoxic respiratory failure requiring intubation on 8/15 and mechanical ventilation. He was unable to be weaned from the ventilator and underwent a tracheostomy on 8/21. He was transferred to triad hospitalist service on 8/26.  Subjective: No complaints.     Assessment & Plan:   Principal Problem:   Acute CVA (cerebrovascular accident)  -With left lateral medullary syndrome, dysphagia, dysarthria, left facial droop, double vision, unsteady gait -Has been evaluated by the stroke team - Plan to continue aspirin and Plavix a months and then aspirin alone -He will go to: Inpatient rehab when respiratory secretions have decreased  Active Problems:    Acute respiratory failure  Dysphasia due to CVA with aspiration pneumonia   PEG tube - Trach placed on 8/21- the is meant to be chronic per PCCM -He has recuperated from the pneumonia but continues to have a large amount of sputum production from trach-Continue trach with Passy-Muir valve -Continue PEG tube feeds which he is tolerating well - I added a scopolamine patch  -he is already on Robinul which I have increased but he still has increased secretions - CXR negative for pneumonia  >> adding IV Robinul today & pulmicort nebs    Essential hypertension -Added amlodipine per tube    Type 2 diabetes mellitus with hyperlipidemia (HCC) -A1c 10.6 on 05/30/2019 -Continue Lantus and subcu insulin    Time spent in minutes: 35 DVT prophylaxis: Lovenox Code  Status: Full code Family Communication:  Disposition Plan: CIR when he does not have much secretions from trach Consultants:   Neurology  Pulmonary critical care Procedures:   PEG tube  Ileostomy  Intubation and extubation Antimicrobials:  Anti-infectives (From admission, onward)   Start     Dose/Rate Route Frequency Ordered Stop   06/19/19 1800  cephALEXin (KEFLEX) 250 MG/5ML suspension 250 mg     250 mg Per Tube Every 6 hours 06/19/19 1539     06/14/19 1500  ceFAZolin (ANCEF) IVPB 1 g/50 mL premix  Status:  Discontinued     1 g 100 mL/hr over 30 Minutes Intravenous Every 8 hours 06/14/19 1313 06/14/19 1333   06/14/19 1500  ceFAZolin (ANCEF) IVPB 2g/100 mL premix  Status:  Discontinued     2 g 200 mL/hr over 30 Minutes Intravenous Every 8 hours 06/14/19 1333 06/19/19 1539   06/13/19 2200  vancomycin (VANCOCIN) 1,500 mg in sodium chloride 0.9 % 500 mL IVPB  Status:  Discontinued     1,500 mg 250 mL/hr over 120 Minutes Intravenous Every 12 hours 06/13/19 0912 06/14/19 1313   06/13/19 0915  piperacillin-tazobactam (ZOSYN) IVPB 4.5 g  Status:  Discontinued     4.5 g 200 mL/hr over 30 Minutes Intravenous Every 8 hours 06/13/19 0900 06/14/19 1313   06/13/19 0915  vancomycin (VANCOCIN) 2,000 mg in sodium chloride 0.9 % 500 mL IVPB     2,000 mg 250 mL/hr over 120 Minutes Intravenous  Once 06/13/19 0912 06/14/19 0809   06/05/19 1630  ceFAZolin (ANCEF) IVPB 2g/100 mL premix  2 g 200 mL/hr over 30 Minutes Intravenous Every 8 hours 06/05/19 1628 06/10/19 1856   06/03/19 0100  Ampicillin-Sulbactam (UNASYN) 3 g in sodium chloride 0.9 % 100 mL IVPB  Status:  Discontinued     3 g 200 mL/hr over 30 Minutes Intravenous Every 6 hours 06/03/19 0008 06/05/19 1608       Objective: Vitals:   06/23/19 0920 06/23/19 1112 06/23/19 1150 06/23/19 1508  BP:  121/63 118/72 129/77  Pulse: 76 78 92 (!) 115  Resp: (!) '22 18 16 '$ (!) 24  Temp:   97.8 F (36.6 C)   TempSrc:   Axillary   SpO2:  96% 94% (!) 9% 99%  Weight:      Height:        Intake/Output Summary (Last 24 hours) at 06/23/2019 1655 Last data filed at 06/23/2019 0708 Gross per 24 hour  Intake 2892 ml  Output 1000 ml  Net 1892 ml   Filed Weights   06/20/19 2345 06/21/19 0422 06/23/19 0500  Weight: (!) 146 kg (!) 142.9 kg (!) 144.6 kg    Examination: General exam: Appears comfortable  HEENT: PERRLA, oral mucosa moist, no sclera icterus or thrush - trach present- has pale yellow secretions coming out of trach Respiratory system: Clear to auscultation. Respiratory effort normal. Cardiovascular system: S1 & S2 heard,  No murmurs  Gastrointestinal system: Abdomen soft, non-tender, nondistended. Normal bowel sounds   Extremities: No cyanosis, clubbing or edema Skin: No rashes or ulcers      Data Reviewed: I have personally reviewed following labs and imaging studies  CBC: Recent Labs  Lab 06/17/19 0533 06/18/19 0603 06/21/19 0330  WBC 9.2 8.0 8.0  NEUTROABS 4.9 4.1 4.3  HGB 12.0* 12.0* 12.3*  HCT 39.3 38.3* 38.7*  MCV 97.8 95.3 94.9  PLT 404* 385 865   Basic Metabolic Panel: Recent Labs  Lab 06/17/19 0533 06/18/19 0603 06/21/19 0330  NA 144 143 137  K 3.6 3.7 4.2  CL 105 101 95*  CO2 32 33* 32  GLUCOSE 209* 174* 222*  BUN '19 18 13  '$ CREATININE 0.71 0.64 0.77  CALCIUM 8.5* 8.6* 8.6*   GFR: Estimated Creatinine Clearance: 141.4 mL/min (by C-G formula based on SCr of 0.77 mg/dL). Liver Function Tests: No results for input(s): AST, ALT, ALKPHOS, BILITOT, PROT, ALBUMIN in the last 168 hours. No results for input(s): LIPASE, AMYLASE in the last 168 hours. No results for input(s): AMMONIA in the last 168 hours. Coagulation Profile: No results for input(s): INR, PROTIME in the last 168 hours. Cardiac Enzymes: No results for input(s): CKTOTAL, CKMB, CKMBINDEX, TROPONINI in the last 168 hours. BNP (last 3 results) No results for input(s): PROBNP in the last 8760 hours. HbA1C: No results for  input(s): HGBA1C in the last 72 hours. CBG: Recent Labs  Lab 06/22/19 1931 06/23/19 0028 06/23/19 0353 06/23/19 0755 06/23/19 1143  GLUCAP 168* 189* 200* 208* 177*   Lipid Profile: No results for input(s): CHOL, HDL, LDLCALC, TRIG, CHOLHDL, LDLDIRECT in the last 72 hours. Thyroid Function Tests: No results for input(s): TSH, T4TOTAL, FREET4, T3FREE, THYROIDAB in the last 72 hours. Anemia Panel: No results for input(s): VITAMINB12, FOLATE, FERRITIN, TIBC, IRON, RETICCTPCT in the last 72 hours. Urine analysis:    Component Value Date/Time   COLORURINE YELLOW 05/30/2019 0151   APPEARANCEUR HAZY (A) 05/30/2019 0151   LABSPEC 1.023 05/30/2019 0151   PHURINE 5.0 05/30/2019 0151   GLUCOSEU >=500 (A) 05/30/2019 0151   HGBUR MODERATE (A) 05/30/2019 0151  BILIRUBINUR NEGATIVE 05/30/2019 0151   KETONESUR 5 (A) 05/30/2019 0151   PROTEINUR 30 (A) 05/30/2019 0151   UROBILINOGEN 0.2 01/20/2008 1222   NITRITE NEGATIVE 05/30/2019 0151   LEUKOCYTESUR NEGATIVE 05/30/2019 0151   Sepsis Labs: '@LABRCNTIP'$ (procalcitonin:4,lacticidven:4) ) No results found for this or any previous visit (from the past 240 hour(s)).       Radiology Studies: Dg Chest Port 1 View  Result Date: 06/22/2019 CLINICAL DATA:  Chronic ventilator dependent respiratory failure with acute cough. EXAM: PORTABLE CHEST 1 VIEW COMPARISON:  06/15/2019 and earlier. FINDINGS: Tracheostomy tube tip in satisfactory position below the thoracic inlet. Cardiac silhouette upper normal in size to mildly enlarged, unchanged. Pulmonary vascularity normal without evidence of pulmonary edema. Improved aeration in the lung bases since the 06/15/2019 examination, with residual mild bibasilar atelectasis, LEFT greater than RIGHT. No new pulmonary parenchymal abnormalities. IMPRESSION: 1. Tracheostomy tube tip in satisfactory position below the thoracic inlet. 2. Improved aeration in the lung bases since the 06/15/19 examination with residual mild  bibasilar atelectasis, LEFT greater than RIGHT. 3. No new abnormalities. Electronically Signed   By: Evangeline Dakin M.D.   On: 06/22/2019 15:54      Scheduled Meds:  amLODipine  10 mg Oral Daily   aspirin  81 mg Per Tube Daily   atorvastatin  40 mg Per Tube q1800   cephALEXin  250 mg Per Tube Q6H   chlorhexidine gluconate (MEDLINE KIT)  15 mL Mouth Rinse BID   Chlorhexidine Gluconate Cloth  6 each Topical Daily   clopidogrel  75 mg Per Tube Daily   enoxaparin (LOVENOX) injection  70 mg Subcutaneous Daily   famotidine  20 mg Per Tube BID   feeding supplement (PRO-STAT SUGAR FREE 64)  30 mL Per Tube TID   finasteride  5 mg Oral Daily   glycopyrrolate  0.1 mg Intravenous TID   glycopyrrolate  2 mg Per Tube BID   insulin aspart  0-15 Units Subcutaneous Q4H   insulin glargine  23 Units Subcutaneous Daily   insulin starter kit- pen needles  1 kit Other Once   mouth rinse  15 mL Mouth Rinse 10 times per day   QUEtiapine  100 mg Per Tube QHS   QUEtiapine  50 mg Per Tube Daily   scopolamine  1 patch Transdermal Q72H   sodium chloride flush  10-40 mL Intracatheter Q12H   Continuous Infusions:  feeding supplement (OSMOLITE 1.2 CAL) 1,000 mL (06/23/19 0541)     LOS: 24 days      Debbe Odea, MD [facility] Pager: [email or web address] Password [email or web address] 06/23/2019, 4:55 PM

## 2019-06-23 NOTE — Progress Notes (Signed)
Inpatient Diabetes Program Recommendations  AACE/ADA: New Consensus Statement on Inpatient Glycemic Control   Target Ranges:  Prepandial:   less than 140 mg/dL      Peak postprandial:   less than 180 mg/dL (1-2 hours)      Critically ill patients:  140 - 180 mg/dL   Results for Michael Bright, Michael Bright (MRN [id number]) as of 06/23/2019 08:50  Ref. Range 06/22/2019 08:25 06/22/2019 11:58 06/22/2019 16:34 06/22/2019 19:31 06/23/2019 00:28 06/23/2019 07:55  Glucose-Capillary Latest Ref Range: 70 - 99 mg/dL 230 (H) 230 (H) 153 (H) 168 (H) 189 (H) 208 (H)    Review of Glycemic Control  Current orders for Inpatient glycemic control: Lantus 20 units daily, Novolog 0-15 units Q4H; Osmolite @ 60 ml/hr  Inpatient Diabetes Program Recommendations:   Insulin-Basal: Please consider increasing Lantus to 23 units daily.  Insulin-Tube Feeding Coverage:  Please consider ordering Novolog 3 units Q4H for tube feeding coverage. If tube feeding is stopped or held then Novolog tube feeding coverage should also be stopped or held.  Thanks, Barnie Alderman, RN, MSN, CDE Diabetes Coordinator Inpatient Diabetes Program [phone] (Team Pager from 8am to 5pm)

## 2019-06-24 LAB — GLUCOSE, CAPILLARY
Glucose-Capillary: 203 mg/dL — ABNORMAL HIGH (ref 70–99)
Glucose-Capillary: 152 mg/dL — ABNORMAL HIGH (ref 70–99)
Glucose-Capillary: 194 mg/dL — ABNORMAL HIGH (ref 70–99)
Glucose-Capillary: 162 mg/dL — ABNORMAL HIGH (ref 70–99)
Glucose-Capillary: 188 mg/dL — ABNORMAL HIGH (ref 70–99)

## 2019-06-24 MED ORDER — INSULIN GLARGINE 100 UNIT/ML ~~LOC~~ SOLN
25.0000 [IU] | Freq: Every day | SUBCUTANEOUS | Status: DC
  Administered 2019-06-25 – 2019-06-27 (×3): 25 [IU] via SUBCUTANEOUS
  Filled 2019-06-24 (×3): qty 0.25

## 2019-06-24 MED ORDER — AMLODIPINE BESYLATE 10 MG PO TABS
10.0000 mg | ORAL_TABLET | Freq: Every day | ORAL | Status: DC
  Administered 2019-06-25 – 2019-06-27 (×3): 10 mg
  Filled 2019-06-24 (×3): qty 1

## 2019-06-24 NOTE — Plan of Care (Signed)
  Problem: Pain Managment: Goal: General experience of comfort will improve Outcome: Progressing   

## 2019-06-24 NOTE — Progress Notes (Signed)
PROGRESS NOTE    Michael Bright   RXV:[id number]  DOB: 1956-08-10  DOA: 05/30/2019 PCP: Patient, No Pcp Per   Brief Narrative:  Michael Bright is a 63 year old male with uncontrolled diabetes mellitus type 2 who stopped taking his medications and seeing his doctors over 10 years ago.  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 PCCM was called due to poor secretion management on 8/12. Complicated by dysphagia, aspiration pneumonia and acute hypoxic respiratory failure requiring intubation on 8/15 and mechanical ventilation. He was unable to be weaned from the ventilator and underwent a tracheostomy on 8/21. He was transferred to triad hospitalist service on 8/26.  Subjective: No complaints.     Assessment & Plan:   Principal Problem:   Acute CVA (cerebrovascular accident)  -With left lateral medullary syndrome, dysphagia, dysarthria, left facial droop, double vision, unsteady gait -Has been evaluated by the stroke team - Plan to continue aspirin and Plavix for 3 months and then aspirin alone -He will go to: Inpatient rehab when respiratory secretions have decreased  Active Problems:    Acute respiratory failure  Dysphasia due to CVA with aspiration pneumonia   - aspiration on tube feeds resulted in pneumonia - treated with Unasyn (started 8/15) by PCCM - Trach placed on 8/21- the is meant to be chronic per PCCM -He has recuperated from the pneumonia but continues to have a large amount of sputum production from trach and was being treated for MSSA Tracheobronchitis - PCCM signed off on 8/30 - I added a scopolamine patch  -he is already on Robinul which I have increased but he still has increased secretions - CXR on 9/3  negative for pneumonia  >> added IV Robinul  & pulmicort nebs- this is helping a little per RN -as sputum production still severe, will reculture sputum- previously  grew MSSA in sputum culture and has been treated with Ancef/Keflex  Dysphagia - cor trac placed on 8/13    Essential hypertension -Added amlodipine per tube- BP better    Type 2 diabetes mellitus with hyperlipidemia (HCC) -A1c 10.6 on 05/30/2019 -Continue Lantus and subcu insulin- increase lantus again today as sugars high    Time spent in minutes: 35 DVT prophylaxis: Lovenox Code Status: Full code Family Communication:  Disposition Plan: CIR when he does not have much secretions from trach Consultants:   Neurology  Pulmonary critical care Procedures:   Cor trac  Tracheostomy  Intubation and extubation Antimicrobials:  Anti-infectives (From admission, onward)   Start     Dose/Rate Route Frequency Ordered Stop   06/19/19 1800  cephALEXin (KEFLEX) 250 MG/5ML suspension 250 mg     250 mg Per Tube Every 6 hours 06/19/19 1539     06/14/19 1500  ceFAZolin (ANCEF) IVPB 1 g/50 mL premix  Status:  Discontinued     1 g 100 mL/hr over 30 Minutes Intravenous Every 8 hours 06/14/19 1313 06/14/19 1333   06/14/19 1500  ceFAZolin (ANCEF) IVPB 2g/100 mL premix  Status:  Discontinued     2 g 200 mL/hr over 30 Minutes Intravenous Every 8 hours 06/14/19 1333 06/19/19 1539   06/13/19 2200  vancomycin (VANCOCIN) 1,500 mg in sodium chloride 0.9 % 500 mL IVPB  Status:  Discontinued     1,500 mg 250 mL/hr over 120 Minutes Intravenous Every 12 hours 06/13/19 0912 06/14/19 1313   06/13/19 0915  piperacillin-tazobactam (ZOSYN) IVPB  4.5 g  Status:  Discontinued     4.5 g 200 mL/hr over 30 Minutes Intravenous Every 8 hours 06/13/19 0900 06/14/19 1313   06/13/19 0915  vancomycin (VANCOCIN) 2,000 mg in sodium chloride 0.9 % 500 mL IVPB     2,000 mg 250 mL/hr over 120 Minutes Intravenous  Once 06/13/19 0912 06/14/19 0809   06/05/19 1630  ceFAZolin (ANCEF) IVPB 2g/100 mL premix     2 g 200 mL/hr over 30 Minutes Intravenous Every 8 hours 06/05/19 1628 06/10/19 1856   06/03/19 0100   Ampicillin-Sulbactam (UNASYN) 3 g in sodium chloride 0.9 % 100 mL IVPB  Status:  Discontinued     3 g 200 mL/hr over 30 Minutes Intravenous Every 6 hours 06/03/19 0008 06/05/19 1608     Objective: Vitals:   06/24/19 0421 06/24/19 0726 06/24/19 0738 06/24/19 1125  BP:      Pulse:  85 85 92  Resp:  14  14  Temp:   97.9 F (36.6 C)   TempSrc:   Axillary   SpO2:  100% 94% 99%  Weight: (!) 142.2 kg     Height:        Intake/Output Summary (Last 24 hours) at 06/24/2019 1422 Last data filed at 06/24/2019 1443 Gross per 24 hour  Intake 1564 ml  Output 1725 ml  Net -161 ml   Filed Weights   06/21/19 0422 06/23/19 0500 06/24/19 0421  Weight: (!) 142.9 kg (!) 144.6 kg (!) 142.2 kg    Examination: General exam: Appears comfortable  HEENT: PERRLA, oral mucosa moist, no sclera icterus or thrush- has yellow secretions from trach, has cor trac Respiratory system: Clear to auscultation. Respiratory effort normal. Cardiovascular system: S1 & S2 heard,  No murmurs  Gastrointestinal system: Abdomen soft, non-tender, nondistended. Normal bowel sounds   Central nervous system: Alert and oriented. No focal neurological deficits. Extremities: No cyanosis, clubbing or edema Skin: No rashes or ulcers Psychiatry:  Mood & affect appropriate.     Data Reviewed: I have personally reviewed following labs and imaging studies  CBC: Recent Labs  Lab 06/18/19 0603 06/21/19 0330  WBC 8.0 8.0  NEUTROABS 4.1 4.3  HGB 12.0* 12.3*  HCT 38.3* 38.7*  MCV 95.3 94.9  PLT 385 154   Basic Metabolic Panel: Recent Labs  Lab 06/18/19 0603 06/21/19 0330  NA 143 137  K 3.7 4.2  CL 101 95*  CO2 33* 32  GLUCOSE 174* 222*  BUN 18 13  CREATININE 0.64 0.77  CALCIUM 8.6* 8.6*   GFR: Estimated Creatinine Clearance: 140 mL/min (by C-G formula based on SCr of 0.77 mg/dL). Liver Function Tests: No results for input(s): AST, ALT, ALKPHOS, BILITOT, PROT, ALBUMIN in the last 168 hours. No results for  input(s): LIPASE, AMYLASE in the last 168 hours. No results for input(s): AMMONIA in the last 168 hours. Coagulation Profile: No results for input(s): INR, PROTIME in the last 168 hours. Cardiac Enzymes: No results for input(s): CKTOTAL, CKMB, CKMBINDEX, TROPONINI in the last 168 hours. BNP (last 3 results) No results for input(s): PROBNP in the last 8760 hours. HbA1C: No results for input(s): HGBA1C in the last 72 hours. CBG: Recent Labs  Lab 06/23/19 2012 06/23/19 2312 06/24/19 0320 06/24/19 0830 06/24/19 1154  GLUCAP 189* 208* 203* 152* 194*   Lipid Profile: No results for input(s): CHOL, HDL, LDLCALC, TRIG, CHOLHDL, LDLDIRECT in the last 72 hours. Thyroid Function Tests: No results for input(s): TSH, T4TOTAL, FREET4, T3FREE, THYROIDAB in the last 72  hours. Anemia Panel: No results for input(s): VITAMINB12, FOLATE, FERRITIN, TIBC, IRON, RETICCTPCT in the last 72 hours. Urine analysis:    Component Value Date/Time   COLORURINE YELLOW 05/30/2019 0151   APPEARANCEUR HAZY (A) 05/30/2019 0151   LABSPEC 1.023 05/30/2019 0151   PHURINE 5.0 05/30/2019 0151   GLUCOSEU >=500 (A) 05/30/2019 0151   HGBUR MODERATE (A) 05/30/2019 0151   BILIRUBINUR NEGATIVE 05/30/2019 0151   KETONESUR 5 (A) 05/30/2019 0151   PROTEINUR 30 (A) 05/30/2019 0151   UROBILINOGEN 0.2 01/20/2008 1222   NITRITE NEGATIVE 05/30/2019 0151   LEUKOCYTESUR NEGATIVE 05/30/2019 0151   Sepsis Labs: '@LABRCNTIP'$ (procalcitonin:4,lacticidven:4) ) No results found for this or any previous visit (from the past 240 hour(s)).       Radiology Studies: Dg Chest Port 1 View  Result Date: 06/22/2019 CLINICAL DATA:  Chronic ventilator dependent respiratory failure with acute cough. EXAM: PORTABLE CHEST 1 VIEW COMPARISON:  06/15/2019 and earlier. FINDINGS: Tracheostomy tube tip in satisfactory position below the thoracic inlet. Cardiac silhouette upper normal in size to mildly enlarged, unchanged. Pulmonary vascularity  normal without evidence of pulmonary edema. Improved aeration in the lung bases since the 06/15/2019 examination, with residual mild bibasilar atelectasis, LEFT greater than RIGHT. No new pulmonary parenchymal abnormalities. IMPRESSION: 1. Tracheostomy tube tip in satisfactory position below the thoracic inlet. 2. Improved aeration in the lung bases since the 06/15/19 examination with residual mild bibasilar atelectasis, LEFT greater than RIGHT. 3. No new abnormalities. Electronically Signed   By: Evangeline Dakin M.D.   On: 06/22/2019 15:54      Scheduled Meds:  [START ON 06/25/2019] amLODipine  10 mg Per Tube Daily   aspirin  81 mg Per Tube Daily   atorvastatin  40 mg Per Tube q1800   budesonide (PULMICORT) nebulizer solution  0.25 mg Nebulization BID   cephALEXin  250 mg Per Tube Q6H   chlorhexidine gluconate (MEDLINE KIT)  15 mL Mouth Rinse BID   Chlorhexidine Gluconate Cloth  6 each Topical Daily   clopidogrel  75 mg Per Tube Daily   enoxaparin (LOVENOX) injection  70 mg Subcutaneous Daily   famotidine  20 mg Per Tube BID   feeding supplement (PRO-STAT SUGAR FREE 64)  30 mL Per Tube TID   finasteride  5 mg Oral Daily   glycopyrrolate  0.1 mg Intravenous TID   glycopyrrolate  2 mg Per Tube BID   insulin aspart  0-15 Units Subcutaneous Q4H   insulin glargine  23 Units Subcutaneous Daily   insulin starter kit- pen needles  1 kit Other Once   mouth rinse  15 mL Mouth Rinse 10 times per day   QUEtiapine  100 mg Per Tube QHS   QUEtiapine  50 mg Per Tube Daily   scopolamine  1 patch Transdermal Q72H   sodium chloride flush  10-40 mL Intracatheter Q12H   Continuous Infusions:  feeding supplement (OSMOLITE 1.2 CAL) 1,000 mL (06/24/19 0419)     LOS: 25 days      Debbe Odea, MD [facility] Pager: [email or web address] Password [email or web address] 06/24/2019, 2:22 PM

## 2019-06-25 LAB — GLUCOSE, CAPILLARY
Glucose-Capillary: 193 mg/dL — ABNORMAL HIGH (ref 70–99)
Glucose-Capillary: 238 mg/dL — ABNORMAL HIGH (ref 70–99)
Glucose-Capillary: 182 mg/dL — ABNORMAL HIGH (ref 70–99)
Glucose-Capillary: 174 mg/dL — ABNORMAL HIGH (ref 70–99)
Glucose-Capillary: 168 mg/dL — ABNORMAL HIGH (ref 70–99)

## 2019-06-25 MED ORDER — SODIUM CHLORIDE 0.9 % IV SOLN
3.0000 g | Freq: Four times a day (QID) | INTRAVENOUS | Status: DC
  Administered 2019-06-25 – 2019-06-27 (×8): 3 g via INTRAVENOUS
  Filled 2019-06-25 (×3): qty 8
  Filled 2019-06-25: qty 3
  Filled 2019-06-25 (×2): qty 8
  Filled 2019-06-25: qty 3
  Filled 2019-06-25: qty 8
  Filled 2019-06-25: qty 3
  Filled 2019-06-25 (×2): qty 8

## 2019-06-25 MED ORDER — ATROPINE ORAL SOLUTION 0.08 MG/ML: 1.0000 mg | Freq: Every morning | ORAL | Status: DC

## 2019-06-25 MED ORDER — ATROPINE SULFATE 1 % OP SOLN
2.0000 [drp] | Freq: Three times a day (TID) | OPHTHALMIC | Status: DC
  Administered 2019-06-25 – 2019-06-26 (×3): 2 [drp] via SUBLINGUAL
  Filled 2019-06-25: qty 2

## 2019-06-25 NOTE — Progress Notes (Signed)
Pharmacy Antibiotic Note  Michael Bright is a 63 y.o. male with a prolonged hospital stay, now s/p trach and with increased secretions concerning for PNA. Pharmacy has been consulted for Unasyn dosing.  New cultures sent 9/5 and are pending, consider checking a CBC to evaluate WBC trend, noted afebrile.   Plan: - Start Unasyn 3g IV every 6 hours - Will continue to follow renal function, culture results, LOT, and antibiotic de-escalation plans   Height: 6' (182.9 cm) Weight: (!) 312 lb 6.3 oz (141.7 kg) IBW/kg (Calculated) : 77.6  Temp (24hrs), Avg:98.4 F (36.9 C), Min:98 F (36.7 C), Max:98.8 F (37.1 C)  Recent Labs  Lab 06/21/19 0330  WBC 8.0  CREATININE 0.77    Estimated Creatinine Clearance: 139.8 mL/min (by C-G formula based on SCr of 0.77 mg/dL).    No Known Allergies  Unasyn 8/15 >>8/17 Cefazolin 8/17 >> 8/22, restarted 8/26 >> 8/31 Vanc 8/25>>8/26 Zosyn 8/25>>8/26 Keflex 8/31 >> 9/5 Unasyn 9/6 >>  9/5 TA >> pending 8/25 BCx >> NGf 8/25 TA >> NOF 8/15 TA - MSSA 8/15 BCx - ngtd 8/12 MRSA pcr negative 8/11 COVID negative  Thank you for allowing pharmacy to be a part of this patient's care.  Alycia Rossetti, PharmD, BCPS Clinical Pharmacist Clinical phone for 06/25/2019: [phone] 06/25/2019 8:44 AM   **Pharmacist phone directory can now be found on [email or web address] (PW [email or web address]).  Listed under [place].

## 2019-06-25 NOTE — Progress Notes (Signed)
PROGRESS NOTE    Michael Bright   WPY:[id number]  DOB: 1956/07/16  DOA: 05/30/2019 PCP: Patient, No Pcp Per   Brief Narrative:  Michael Bright is a 63 year old male with uncontrolled diabetes mellitus type 2 who stopped taking his medications and seeing his doctors over 10 years ago.  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 PCCM was called due to poor secretion management on 8/12. Complicated by dysphagia, aspiration pneumonia and acute hypoxic respiratory failure requiring intubation on 8/15 and mechanical ventilation. He was unable to be weaned from the ventilator and underwent a tracheostomy on 8/21. He was transferred to triad hospitalist service on 8/26.  Subjective: No complaints.     Assessment & Plan:   Principal Problem:   Acute CVA (cerebrovascular accident)  -With left lateral medullary syndrome, dysphagia, dysarthria, left facial droop, double vision, unsteady gait -Has been evaluated by the stroke team - Plan to continue aspirin and Plavix for 3 months and then aspirin alone -He will go to: Inpatient rehab when respiratory secretions have decreased  Active Problems:    Acute respiratory failure  Dysphasia due to CVA with aspiration pneumonia   - aspiration on tube feeds resulted in pneumonia - treated with Unasyn (started 8/15) by PCCM - Trach placed on 8/21- the is meant to be chronic per PCCM -He has recuperated from the pneumonia but continues to have a large amount of sputum production from trach and was being treated for MSSA Tracheobronchitis - PCCM signed off on 8/30 - I added a scopolamine patch  -he is already on Robinul which I have increased but he still has increased secretions - CXR on 9/3  negative for pneumonia  >> added IV Robinul  & pulmicort nebs - per RN today, not helping  - previously grew MSSA in sputum culture and has been treated with  Ancef/Keflex - sent another sputum culture yesterday which shows large amount of gram neg rods- -added Unasyn- follow culture - add Atropine drops  Dysphagia - cor trac placed on 8/13- remain NPO per SLP    Essential hypertension -Added amlodipine per tube- BP better    Type 2 diabetes mellitus with hyperlipidemia (HCC) -A1c 10.6 on 05/30/2019 -Continue Lantus and subcu insulin- increase lantus again today as sugars high    Time spent in minutes: 35 DVT prophylaxis: Lovenox Code Status: Full code Family Communication:  Disposition Plan: CIR when he does not have much secretions from trach Consultants:   Neurology  Pulmonary critical care Procedures:   Cor trac  Tracheostomy  Intubation and extubation Antimicrobials:  Anti-infectives (From admission, onward)   Start     Dose/Rate Route Frequency Ordered Stop   06/25/19 1000  Ampicillin-Sulbactam (UNASYN) 3 g in sodium chloride 0.9 % 100 mL IVPB     3 g 200 mL/hr over 30 Minutes Intravenous Every 6 hours 06/25/19 0840     06/19/19 1800  cephALEXin (KEFLEX) 250 MG/5ML suspension 250 mg  Status:  Discontinued     250 mg Per Tube Every 6 hours 06/19/19 1539 06/24/19 1425   06/14/19 1500  ceFAZolin (ANCEF) IVPB 1 g/50 mL premix  Status:  Discontinued     1 g 100 mL/hr over 30 Minutes Intravenous Every 8 hours 06/14/19 1313 06/14/19 1333   06/14/19 1500  ceFAZolin (ANCEF) IVPB 2g/100 mL premix  Status:  Discontinued     2 g 200 mL/hr  over 30 Minutes Intravenous Every 8 hours 06/14/19 1333 06/19/19 1539   06/13/19 2200  vancomycin (VANCOCIN) 1,500 mg in sodium chloride 0.9 % 500 mL IVPB  Status:  Discontinued     1,500 mg 250 mL/hr over 120 Minutes Intravenous Every 12 hours 06/13/19 0912 06/14/19 1313   06/13/19 0915  piperacillin-tazobactam (ZOSYN) IVPB 4.5 g  Status:  Discontinued     4.5 g 200 mL/hr over 30 Minutes Intravenous Every 8 hours 06/13/19 0900 06/14/19 1313   06/13/19 0915  vancomycin (VANCOCIN) 2,000 mg in  sodium chloride 0.9 % 500 mL IVPB     2,000 mg 250 mL/hr over 120 Minutes Intravenous  Once 06/13/19 0912 06/14/19 0809   06/05/19 1630  ceFAZolin (ANCEF) IVPB 2g/100 mL premix     2 g 200 mL/hr over 30 Minutes Intravenous Every 8 hours 06/05/19 1628 06/10/19 1856   06/03/19 0100  Ampicillin-Sulbactam (UNASYN) 3 g in sodium chloride 0.9 % 100 mL IVPB  Status:  Discontinued     3 g 200 mL/hr over 30 Minutes Intravenous Every 6 hours 06/03/19 0008 06/05/19 1608     Objective: Vitals:   06/25/19 0428 06/25/19 0750 06/25/19 0838 06/25/19 1150  BP:  116/73  121/78  Pulse:  94  (!) 104  Resp:  18  16  Temp:  98.8 F (37.1 C)  99.2 F (37.3 C)  TempSrc:  Oral  Oral  SpO2:  97% 96% 98%  Weight: (!) 141.7 kg     Height:        Intake/Output Summary (Last 24 hours) at 06/25/2019 1406 Last data filed at 06/25/2019 0648 Gross per 24 hour  Intake 1828 ml  Output 1650 ml  Net 178 ml   Filed Weights   06/23/19 0500 06/24/19 0421 06/25/19 0428  Weight: (!) 144.6 kg (!) 142.2 kg (!) 141.7 kg    Examination: General exam: Appears comfortable  HEENT: PERRLA, oral mucosa moist, no sclera icterus or thrush Respiratory system: Clear to auscultation. Respiratory effort normal. Cardiovascular system: S1 & S2 heard,  No murmurs  Gastrointestinal system: Abdomen soft, non-tender, nondistended. Normal bowel sounds   Central nervous system: Alert and oriented. No focal neurological deficits. Extremities: No cyanosis, clubbing or edema Skin: No rashes or ulcers Psychiatry:  Mood & affect appropriate.      Data Reviewed: I have personally reviewed following labs and imaging studies  CBC: Recent Labs  Lab 06/21/19 0330  WBC 8.0  NEUTROABS 4.3  HGB 12.3*  HCT 38.7*  MCV 94.9  PLT 161   Basic Metabolic Panel: Recent Labs  Lab 06/21/19 0330  NA 137  K 4.2  CL 95*  CO2 32  GLUCOSE 222*  BUN 13  CREATININE 0.77  CALCIUM 8.6*   GFR: Estimated Creatinine Clearance: 139.8 mL/min (by  C-G formula based on SCr of 0.77 mg/dL). Liver Function Tests: No results for input(s): AST, ALT, ALKPHOS, BILITOT, PROT, ALBUMIN in the last 168 hours. No results for input(s): LIPASE, AMYLASE in the last 168 hours. No results for input(s): AMMONIA in the last 168 hours. Coagulation Profile: No results for input(s): INR, PROTIME in the last 168 hours. Cardiac Enzymes: No results for input(s): CKTOTAL, CKMB, CKMBINDEX, TROPONINI in the last 168 hours. BNP (last 3 results) No results for input(s): PROBNP in the last 8760 hours. HbA1C: No results for input(s): HGBA1C in the last 72 hours. CBG: Recent Labs  Lab 06/24/19 1929 06/24/19 2314 06/25/19 0325 06/25/19 0747 06/25/19 1154  GLUCAP 162* 188*  193* 238* 182*   Lipid Profile: No results for input(s): CHOL, HDL, LDLCALC, TRIG, CHOLHDL, LDLDIRECT in the last 72 hours. Thyroid Function Tests: No results for input(s): TSH, T4TOTAL, FREET4, T3FREE, THYROIDAB in the last 72 hours. Anemia Panel: No results for input(s): VITAMINB12, FOLATE, FERRITIN, TIBC, IRON, RETICCTPCT in the last 72 hours. Urine analysis:    Component Value Date/Time   COLORURINE YELLOW 05/30/2019 0151   APPEARANCEUR HAZY (A) 05/30/2019 0151   LABSPEC 1.023 05/30/2019 0151   PHURINE 5.0 05/30/2019 0151   GLUCOSEU >=500 (A) 05/30/2019 0151   HGBUR MODERATE (A) 05/30/2019 0151   BILIRUBINUR NEGATIVE 05/30/2019 0151   KETONESUR 5 (A) 05/30/2019 0151   PROTEINUR 30 (A) 05/30/2019 0151   UROBILINOGEN 0.2 01/20/2008 1222   NITRITE NEGATIVE 05/30/2019 0151   LEUKOCYTESUR NEGATIVE 05/30/2019 0151   Sepsis Labs: '@LABRCNTIP'$ (procalcitonin:4,lacticidven:4) ) Recent Results (from the past 240 hour(s))  Culture, respiratory (non-expectorated)     Status: None (Preliminary result)   Collection Time: 06/24/19  3:10 PM   Specimen: Tracheal Aspirate; Respiratory  Result Value Ref Range Status   Specimen Description TRACHEAL ASPIRATE  Final   Special Requests NONE   Final   Gram Stain   Final    FEW SQUAMOUS EPITHELIAL CELLS PRESENT RARE WBC PRESENT, PREDOMINANTLY PMN MODERATE GRAM NEGATIVE RODS FEW GRAM POSITIVE COCCI RARE GRAM POSITIVE RODS    Culture   Final    CULTURE REINCUBATED FOR BETTER GROWTH Performed at [facility], [place] [address] [place], [place] [address]    Report Status PENDING  Incomplete         Radiology Studies: No results found.    Scheduled Meds: . amLODipine  10 mg Per Tube Daily  . aspirin  81 mg Per Tube Daily  . atorvastatin  40 mg Per Tube q1800  . budesonide (PULMICORT) nebulizer solution  0.25 mg Nebulization BID  . chlorhexidine gluconate (MEDLINE KIT)  15 mL Mouth Rinse BID  . Chlorhexidine Gluconate Cloth  6 each Topical Daily  . clopidogrel  75 mg Per Tube Daily  . enoxaparin (LOVENOX) injection  70 mg Subcutaneous Daily  . famotidine  20 mg Per Tube BID  . feeding supplement (PRO-STAT SUGAR FREE 64)  30 mL Per Tube TID  . finasteride  5 mg Oral Daily  . glycopyrrolate  0.1 mg Intravenous TID  . glycopyrrolate  2 mg Per Tube BID  . insulin aspart  0-15 Units Subcutaneous Q4H  . insulin glargine  25 Units Subcutaneous Daily  . insulin starter kit- pen needles  1 kit Other Once  . mouth rinse  15 mL Mouth Rinse 10 times per day  . QUEtiapine  100 mg Per Tube QHS  . QUEtiapine  50 mg Per Tube Daily  . scopolamine  1 patch Transdermal Q72H  . sodium chloride flush  10-40 mL Intracatheter Q12H   Continuous Infusions: . ampicillin-sulbactam (UNASYN) IV 3 g (06/25/19 1101)  . feeding supplement (OSMOLITE 1.2 CAL) 60 mL/hr at 06/25/19 0943     LOS: 26 days      Debbe Odea, MD [facility] Pager: [email or web address] Password [email or web address] 06/25/2019, 2:06 PM

## 2019-06-26 ENCOUNTER — Inpatient Hospital Stay (HOSPITAL_COMMUNITY): Payer: BC Managed Care – PPO

## 2019-06-26 ENCOUNTER — Encounter (HOSPITAL_COMMUNITY): Payer: Self-pay | Admitting: Radiology

## 2019-06-26 LAB — GLUCOSE, CAPILLARY
Glucose-Capillary: 143 mg/dL — ABNORMAL HIGH (ref 70–99)
Glucose-Capillary: 153 mg/dL — ABNORMAL HIGH (ref 70–99)
Glucose-Capillary: 160 mg/dL — ABNORMAL HIGH (ref 70–99)
Glucose-Capillary: 180 mg/dL — ABNORMAL HIGH (ref 70–99)
Glucose-Capillary: 196 mg/dL — ABNORMAL HIGH (ref 70–99)
Glucose-Capillary: 204 mg/dL — ABNORMAL HIGH (ref 70–99)
Glucose-Capillary: 212 mg/dL — ABNORMAL HIGH (ref 70–99)

## 2019-06-26 LAB — CBC
HCT: 56 % — ABNORMAL HIGH (ref 39.0–52.0)
HCT: 41.7 % (ref 39.0–52.0)
Hemoglobin: 18.2 g/dL — ABNORMAL HIGH (ref 13.0–17.0)
Hemoglobin: 13.6 g/dL (ref 13.0–17.0)
MCH: 30 pg (ref 26.0–34.0)
MCH: 30.2 pg (ref 26.0–34.0)
MCHC: 32.5 g/dL (ref 30.0–36.0)
MCHC: 32.6 g/dL (ref 30.0–36.0)
MCV: 92.4 fL (ref 80.0–100.0)
MCV: 92.5 fL (ref 80.0–100.0)
Platelets: 149 10*3/uL — ABNORMAL LOW (ref 150–400)
Platelets: 295 10*3/uL (ref 150–400)
RBC: 6.06 MIL/uL — ABNORMAL HIGH (ref 4.22–5.81)
RBC: 4.51 MIL/uL (ref 4.22–5.81)
RDW: 11.8 % (ref 11.5–15.5)
RDW: 11.8 % (ref 11.5–15.5)
WBC: 4.5 10*3/uL (ref 4.0–10.5)
WBC: 6.8 10*3/uL (ref 4.0–10.5)
nRBC: 0 % (ref 0.0–0.2)
nRBC: 0 % (ref 0.0–0.2)

## 2019-06-26 LAB — BASIC METABOLIC PANEL
Anion gap: 9 (ref 5–15)
BUN: 14 mg/dL (ref 8–23)
CO2: 31 mmol/L (ref 22–32)
Calcium: 8.7 mg/dL — ABNORMAL LOW (ref 8.9–10.3)
Chloride: 97 mmol/L — ABNORMAL LOW (ref 98–111)
Creatinine, Ser: 0.84 mg/dL (ref 0.61–1.24)
GFR calc Af Amer: >60 mL/min (ref >60)
GFR calc non Af Amer: >60 mL/min (ref >60)
Glucose, Bld: 168 mg/dL — ABNORMAL HIGH (ref 70–99)
Potassium: 4.2 mmol/L (ref 3.5–5.1)
Sodium: 137 mmol/L (ref 135–145)

## 2019-06-26 LAB — CULTURE, RESPIRATORY W GRAM STAIN

## 2019-06-26 LAB — D-DIMER, QUANTITATIVE: D-Dimer, Quant: 2.05 ug/mL-FEU — ABNORMAL HIGH (ref 0.00–0.50)

## 2019-06-26 MED ORDER — IOHEXOL 350 MG/ML SOLN
80.0000 mL | Freq: Once | INTRAVENOUS | Status: AC | PRN
  Administered 2019-06-26: 80 mL via INTRAVENOUS

## 2019-06-26 NOTE — Progress Notes (Signed)
Physical Therapy Treatment Patient Details Name: Michael Bright MRN: [id number] DOB: [birth year] Today's Date: 06/26/2019    History of Present Illness Pt adm with lt sided weakness and numbness. Pt with lt medullary infarct. Intubated 8/15 due to suspected aspiration. trach 8/21. PMH -  DM, HTN    PT Comments    Pt ambulated 6' and 10' with RW with mod A +2, L knee intermittently buckling and hyperextending with compensation from LUE on RW. Blocked L knee for working on standing balance at sink with OT. Pt with sufficient strength for sit<>stand from bed and chair with min A +2. HR up to 137 bpm with ambulation, SPO2 upper 90's except when coughing when he desats. PT will continue to follow.    Follow Up Recommendations  CIR     Equipment Recommendations  Other (comment)(defer)    Recommendations for Other Services       Precautions / Restrictions Precautions Precautions: Fall;Other (comment) Precaution Comments: cortrak, watch BP, trach collar; PMV with therapies(note: could not find PMV in room) Restrictions Weight Bearing Restrictions: No    Mobility  Bed Mobility Overal bed mobility: Needs Assistance Bed Mobility: Supine to Sit     Supine to sit: Min assist;HOB elevated Sit to supine: HOB elevated;Min guard   General bed mobility comments: Min A for pulling with LUE at therpist's hand. Cues for scooting towards EOB  Transfers Overall transfer level: Needs assistance Equipment used: Rolling walker (2 wheeled) Transfers: Sit to/from Stand Sit to Stand: Min assist;+2 safety/equipment         General transfer comment: Min A +2 for gaining balance in standing. No needed assistance for power up. Cues for hand placement  Ambulation/Gait Ambulation/Gait assistance: +2 physical assistance;Mod assist Gait Distance (Feet): 16 Feet(6', 10') Assistive device: Rolling walker (2 wheeled) Gait Pattern/deviations: Decreased stance time - left;Decreased weight shift to  left;Decreased step length - right;Decreased step length - left;Step-through pattern;Ataxic Gait velocity: decr Gait velocity interpretation: <1.31 ft/sec, indicative of household ambulator General Gait Details: pt's L knee intermittently hyperextending and buckling, compensating with increased wt through LUE    Stairs             Wheelchair Mobility    Modified Rankin (Stroke Patients Only) Modified Rankin (Stroke Patients Only) Pre-Morbid Rankin Score: No symptoms Modified Rankin: Moderately severe disability     Balance Overall balance assessment: Needs assistance Sitting-balance support: Feet supported;Single extremity supported Sitting balance-Leahy Scale: Good Sitting balance - Comments: supervision for sitting balance on EOB Postural control: Right lateral lean Standing balance support: No upper extremity supported;During functional activity;Bilateral upper extremity supported Standing balance-Leahy Scale: Poor Standing balance comment: Continues to require physical assistance to maintain standing balance with weakness at left knee                            Cognition Arousal/Alertness: Awake/alert Behavior During Therapy: WFL for tasks assessed/performed Overall Cognitive Status: Impaired/Different from baseline Area of Impairment: Safety/judgement                         Safety/Judgement: Decreased awareness of safety;Decreased awareness of deficits     General Comments: Pt following simple cues and demonstrating increased awareness for assistance needed with deficits. Applying education provided such as hand placement with transfers - require cues for first transfer and then applying techniques for second transfer      Exercises Other Exercises Other Exercises: discussed proper posture  in relation to L shoulder discomfort and better breath support    General Comments General comments (skin integrity, edema, etc.): Pt dizzy with  initial sitting but BP stable. HR up to 137 bpm with ambulation but returned to 110's with seated rest. SpO2 in 90's on 4-6 L O2 except with cough when it decreased to 79% for short time      Pertinent Vitals/Pain Pain Assessment: Faces Faces Pain Scale: Hurts a little bit Pain Location: achy all over Pain Descriptors / Indicators: Aching;Discomfort Pain Intervention(s): Repositioned    Home Living                      Prior Function            PT Goals (current goals can now be found in the care plan section) Acute Rehab PT Goals Patient Stated Goal: go to rehab PT Goal Formulation: With patient Time For Goal Achievement: 06/15/19 Potential to Achieve Goals: Good Progress towards PT goals: Progressing toward goals    Frequency    Min 3X/week      PT Plan Current plan remains appropriate    Co-evaluation PT/OT/SLP Co-Evaluation/Treatment: Yes Reason for Co-Treatment: Complexity of the patient's impairments (multi-system involvement);For patient/therapist safety;To address functional/ADL transfers PT goals addressed during session: Mobility/safety with mobility;Balance;Proper use of DME OT goals addressed during session: ADL's and self-care      AM-PAC PT "6 Clicks" Mobility   Outcome Measure  Help needed turning from your back to your side while in a flat bed without using bedrails?: A Lot Help needed moving from lying on your back to sitting on the side of a flat bed without using bedrails?: A Lot Help needed moving to and from a bed to a chair (including a wheelchair)?: A Lot Help needed standing up from a chair using your arms (e.g., wheelchair or bedside chair)?: A Lot Help needed to walk in hospital room?: A Lot Help needed climbing 3-5 steps with a railing? : Total 6 Click Score: 11    End of Session Equipment Utilized During Treatment: Gait belt;Oxygen Activity Tolerance: Patient tolerated treatment well Patient left: with call bell/phone within  reach;in chair;with chair alarm set Nurse Communication: Mobility status PT Visit Diagnosis: Unsteadiness on feet (R26.81);Hemiplegia and hemiparesis Hemiplegia - Right/Left: Left Hemiplegia - dominant/non-dominant: Non-dominant Hemiplegia - caused by: Cerebral infarction     Time: [id number] PT Time Calculation (min) (ACUTE ONLY): 43 min  Charges:  $Gait Training: 8-22 mins                     Lyanne CoVictoria Merrel Crabbe, PT  Acute Rehab Services  Pager [phone] Office [phone]    Lawana ChambersVictoria L Corianne Buccellato 06/26/2019, 12:47 PM

## 2019-06-26 NOTE — Progress Notes (Signed)
Occupational Therapy Treatment Patient Details Name: Michael Bright MRN: [id number] DOB: 1955/12/04 Today's Date: 06/26/2019    History of present illness Pt adm with lt sided weakness and numbness. Pt with lt medullary infarct. Intubated 8/15 due to suspected aspiration. trach 8/21. PMH -  DM, HTN   OT comments  Pt progressing towards established OT goals. Pt performing grooming task standing at sink with Mod A +2 for balance and blocking of left knee. Pt presenting with increased activity tolerance to perform mobility to sink and then grooming while standing before taking seat rest break. HR elevating to 137 with activity and SpO2 >96% on 4L O2; SpO2 dropping to 79% with coughing. Pt continues to present with high motivation and continue to recommend dc to CIR for intensive OT. Will continue to follow acutely as admitted.    Follow Up Recommendations  CIR;Supervision/Assistance - 24 hour    Equipment Recommendations  Other (comment)(defer to next venue of care)    Recommendations for Other Services      Precautions / Restrictions Precautions Precautions: Fall;Other (comment) Precaution Comments: cortrak, watch BP, trach collar; PMV with therapies Restrictions Weight Bearing Restrictions: No       Mobility Bed Mobility Overal bed mobility: Needs Assistance Bed Mobility: Supine to Sit     Supine to sit: Min assist;HOB elevated     General bed mobility comments: Min A for pulling with LUE at therpist's hand. Cues for scooting towards EOB  Transfers Overall transfer level: Needs assistance Equipment used: Rolling walker (2 wheeled) Transfers: Sit to/from Stand Sit to Stand: Min assist;+2 safety/equipment         General transfer comment: Min A +2 for gaining balance in standing. No needed assistance for power up. CUes for hand placement    Balance Overall balance assessment: Needs assistance Sitting-balance support: Feet supported;Single extremity supported Sitting  balance-Leahy Scale: Good     Standing balance support: No upper extremity supported;During functional activity;Bilateral upper extremity supported Standing balance-Leahy Scale: Poor Standing balance comment: Continues to require physical assistance to maintain standing balance with weakness at left knee                           ADL either performed or assessed with clinical judgement   ADL Overall ADL's : Needs assistance/impaired     Grooming: Wash/dry face;Moderate assistance;Standing Grooming Details (indicate cue type and reason): Pt requiring Mod A +2 for standing balance at sink to wash his face. Pt requiring left knee to be blocked due to weakness. Able to perform bilateral coorindation of UE for several seconds before returning hand to sink for support                 Toilet Transfer: +2 for physical assistance;Minimal assistance;Ambulation;RW(Simulated to recliner) Toilet Transfer Details (indicate cue type and reason): Min A +2 for gaining stanidng balance. Requiring Mod A +2 for mobility         Functional mobility during ADLs: Moderate assistance;+2 for physical assistance;Rolling walker General ADL Comments: Pt demonstrating incrreased activity tolerance to mobility to sink with Mod A +2 and thne perform grooming task before taking seated rest break     Vision   Vision Assessment?: Vision impaired- to be further tested in functional context   Perception     Praxis      Cognition Arousal/Alertness: Awake/alert Behavior During Therapy: WFL for tasks assessed/performed Overall Cognitive Status: Impaired/Different from baseline Area of Impairment: Safety/judgement  Safety/Judgement: Decreased awareness of safety;Decreased awareness of deficits     General Comments: Pt following simple cues and demonstrating increased awareness for assistance needed with deficits. Applying education provided such as hand  placement with transfers - require cues for firts transfer and then applying techniques for second transfer        Exercises Exercises: Other exercises Other Exercises Other Exercises: Educating pt on scapular retraction; 10 reps; hold for 3 secs.   Shoulder Instructions       General Comments BP stable. HR elevating to 137 duirng activity; decreased with seated rest break. Spo2 >96% on 4L; decreased to 79% with cough but quickly returned to 90s.     Pertinent Vitals/ Pain       Pain Assessment: No/denies pain  Home Living                                          Prior Functioning/Environment              Frequency  Min 2X/week        Progress Toward Goals  OT Goals(current goals can now be found in the care plan section)  Progress towards OT goals: Progressing toward goals  Acute Rehab OT Goals Patient Stated Goal: verbalized wanting to ambulate and sit up in chair at end of session with PMSV OT Goal Formulation: With patient Time For Goal Achievement: 07/05/19 ADL Goals Pt Will Perform Grooming: with min assist;standing Pt Will Perform Upper Body Bathing: with min guard assist;sitting Pt Will Perform Lower Body Bathing: with min guard assist;sit to/from stand;sitting/lateral leans Pt Will Perform Upper Body Dressing: with min guard assist;sitting;standing Pt Will Perform Lower Body Dressing: with min guard assist;sitting/lateral leans;sit to/from stand Pt Will Transfer to Toilet: with min assist;bedside commode;ambulating  Plan Discharge plan remains appropriate    Co-evaluation    PT/OT/SLP Co-Evaluation/Treatment: Yes Reason for Co-Treatment: Complexity of the patient's impairments (multi-system involvement);For patient/therapist safety;To address functional/ADL transfers   OT goals addressed during session: ADL's and self-care      AM-PAC OT "6 Clicks" Daily Activity     Outcome Measure   Help from another person eating meals?: A  Lot Help from another person taking care of personal grooming?: A Lot Help from another person toileting, which includes using toliet, bedpan, or urinal?: A Lot Help from another person bathing (including washing, rinsing, drying)?: A Lot Help from another person to put on and taking off regular upper body clothing?: A Lot Help from another person to put on and taking off regular lower body clothing?: A Lot 6 Click Score: 12    End of Session Equipment Utilized During Treatment: Rolling walker;Oxygen;Gait belt  OT Visit Diagnosis: Unsteadiness on feet (R26.81);Other abnormalities of gait and mobility (R26.89);Muscle weakness (generalized) (M62.81);History of falling (Z91.81);Other symptoms and signs involving the nervous system (R29.898)   Activity Tolerance Patient tolerated treatment well   Patient Left in chair;with call bell/phone within reach;with chair alarm set   Nurse Communication Mobility status        Time: [phone] OT Time Calculation (min): 43 min  Charges: OT General Charges $OT Visit: 1 Visit OT Treatments $Self Care/Home Management : 23-37 mins  [place], OTR/L Acute Rehab Pager: [phone] Office: [place] 06/26/2019, 10:02 AM

## 2019-06-26 NOTE — Progress Notes (Signed)
  Pt seen at bedside, HR ~120 bpm .  No SOB or dizziness.  Concerns about more aggressive exercise with high resting HR.  Will need this addressed prior to CIR. Discussed with Dr Wynelle Cleveland.  Rehab admit coordinator to follow .  Marland KitchenCharlett Blake M.D. [facility] FAAPM&R (Sports Med, Neuromuscular Med) Diplomate Am Board of Electrodiagnostic Med

## 2019-06-26 NOTE — Progress Notes (Signed)
  Speech Language Pathology Treatment: [name] Speaking valve;Dysphagia  Patient Details Name: Michael Bright MRN: [id number] DOB: 1956/02/20 Today's Date: 06/26/2019 Time: 1020-1050 SLP Time Calculation (min) (ACUTE ONLY): 30 min  Assessment / Plan / Recommendation Clinical Impression  F/u for dysphagia and PMV/speech treatment.  Pt sitting in recliner upon arrival.  PMV was lost several days ago per RN.  Pt provided with new valve, which was attached to trach collar with tether line.  PMV placed for 30 minute session - pt demonstrated excellent toleration with stable VS, good oxygenation and no s/s of air trapping upon intermittent removal of valve. Pt's voice is stronger, still mildly hypophonic, but intelligibility is improved to >80% at conversational levels.  Swallowing remains a primary issue, consistent with Wallenburg syndrome.  Reviewed with pt the location of stroke in the medulla, and the medulla's function in regulating swallow. We viewed images and discussed the impact of this kind of stroke on swallowing.  Pt trialed several ice chips, which continued to elicit immediate and consistent coughing due to probable aspiration.  Pt will benefit from an instrumental swallow study in the next few days to establish a baseline and help to target swallow remediation. Pt is eager to D/C cortrak - we reviewed possible outcomes, including the possibility of needing a PEG, not uncommon with this dx.  Pt verbalized understanding that this option may be necessary; however, we agreed to plan the swallow study before making further decisions.  Will plan swallow study next 24-48 hours.    HPI HPI: Michael Bright is a 63 y.o. male with hx of myotonic dystrophy 2 (DM2) and diabetes who presented with difficulty swallowing. Pt was transferred to ICU 8/11 for acute hypoxic respiratory failure most likely 2/2 aspiration in setting of stroke, resulting in aspiration PNA. L side hemiparesis, and diplopia. 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 Evaluated for dysphagia 8/12 and cog/ling with diagnoses of severe oropharyngeal dysphagia (NPO) and mod dysarthria. 8/14 Intubated 8/15, evaluated by SLP 8/19 with recommendation for trach. Trach placed 8/21 with SLP recommendation for PMV same day. Experienced worsening paranoia and delirium 8/23. Pt on tube feed, experiencing nausa and emesis 8/24.         SLP Plan  Continue with current plan of care       Recommendations  Diet recommendations: NPO      Patient may use Passy-Muir Speech Valve: During all therapies with supervision PMSV Supervision: Full         Oral Care Recommendations: Oral care QID Follow up Recommendations: Inpatient Rehab SLP Visit Diagnosis: Aphonia (R49.1);Dysphagia, unspecified (R13.10) Plan: Continue with current plan of care       [place]. Tivis Ringer, [place] Office number [phone] Pager [phone]   Juan Quam Laurice 06/26/2019, 10:58 AM

## 2019-06-26 NOTE — Progress Notes (Signed)
Pt transported off unit to CT. P. Amo Christofer Shen RN 

## 2019-06-26 NOTE — Progress Notes (Signed)
Pt called RN to room but noted to have expressive aphasia and repetitive answers; Dr. Erlinda Hong from neurology and Dr. Wynelle Cleveland paged and notified; Dr. Erlinda Hong returned call back and came to assess pt. VSS; pt resting comfortably in bed with call light within reach. Will continue to closely monitor. Francis Gaines Aja Bolander RN   06/26/19 1830  Vitals  BP 105/71  MAP (mmHg) 82  BP Location Left Arm  BP Method Automatic  Patient Position (if appropriate) Lying  Pulse Rate (!) 108  Pulse Rate Source Monitor  ECG Heart Rate (!) 109  Resp 20  Oxygen Therapy  SpO2 98 %  O2 Device Tracheostomy Collar  O2 Flow Rate (L/min) 5 L/min  FiO2 (%) 28 %  Patient Activity (if Appropriate) In bed  MEWS Score  MEWS RR 0  MEWS Pulse 1  MEWS Systolic 0  MEWS LOC 0  MEWS Temp 0  MEWS Score 1  MEWS Score Color Green

## 2019-06-26 NOTE — Plan of Care (Signed)
Called by RN that pt has difficulty with words out and repeating words. Pt was then examined at bedside. He is on trach collar with Passy-Muir valve. Mild dysarthria but able to tell me his name, wrong on age, hospital for "[facility]", but able to name and repeat fluently. Spontaneous speech but less fluent, with paraphasic errors. PERRL, facial symmetrical, no gaze deviation, tongue midline, moving all extremities well. Pt condition more consistent with encephalopathy. Noted to have tachycardia and underwent CTA chest to rule out PE. Continue further management as per primary team, no neuro follow up needed at this time.   Rosalin Hawking, MD PhD Stroke Neurology 06/26/2019 6:53 PM

## 2019-06-26 NOTE — Progress Notes (Addendum)
PROGRESS NOTE    Michael Bright   WUJ:[id number]  DOB: Mar 03, 1956  DOA: 05/30/2019 PCP: Patient, No Pcp Per   Brief Narrative:  Michael Bright is a 63 year old male with uncontrolled diabetes mellitus type 2 who stopped taking his medications and seeing his doctors over 10 years ago.  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 PCCM was called due to poor secretion management on 8/12. Complicated by dysphagia, aspiration pneumonia and acute hypoxic respiratory failure requiring intubation on 8/15 and mechanical ventilation. He was unable to be weaned from the ventilator and underwent a tracheostomy on 8/21. He was transferred to [facility] hospitalist service on 8/26.  Subjective: He admits that his secretions are decreasing.  He has no complaints.    Assessment & Plan:   Principal Problem:   Acute CVA (cerebrovascular accident)  -With left lateral medullary syndrome, dysphagia, dysarthria, left facial droop, double vision, unsteady gait -Has been evaluated by the stroke team - Plan to continue aspirin and Plavix for 3 months and then aspirin alone -He will go to: Inpatient rehab when respiratory secretions have decreased  Active Problems:    Acute respiratory failure  Dysphasia due to CVA with aspiration pneumonia   - aspiration on tube feeds resulted in pneumonia - treated with Unasyn (started 8/15) by PCCM - Trach placed on 8/21- the is meant to be chronic per PCCM -He has recuperated from the pneumonia but continues to have a large amount of sputum production from trach and was being treated for MSSA Tracheobronchitis - PCCM signed off on 8/30 - I added a scopolamine patch  -he is already on Robinul which I have increased but he still has increased secretions - CXR on 9/3  negative for pneumonia  >> added IV Robinul  & pulmicort nebs - per RN today, not helping  - previously grew MSSA  in sputum culture and has been treated with Ancef/Keflex - sent another sputum culture 9/6 which shows large amount of gram neg rods- -added Unasyn- follow culture - added Atropine drops -sputum growing Haemophilus influenza- after starting Unasyn and atropine, his secretions are decreasing- we will hold atropine to see if it is Unasyn that has helped - CT scan today>>> b/l atelectasis and consolidation - cont Unasyn  Tachycardia hypoxia with pulse ox of 87 on exertion -D-dimer elevated at 2 -We will obtain a CT of the chest to rule out PE- see above - stopping Atropine drops  Dysphagia - cor trac placed on 8/13- remain NPO per SLP- with SLP again today-SLP now plans on a modified barium swallow as secretions are improving    Essential hypertension -Added amlodipine per tube- BP better    Type 2 diabetes mellitus with hyperlipidemia (HCC) -A1c 10.6 on 05/30/2019 -Continue Lantus and subcu insulin   Time spent in minutes: 35 DVT prophylaxis: Lovenox Code Status: Full code Family Communication:  Disposition Plan: CIR   Consultants:   Neurology  Pulmonary critical care Procedures:   Cor trac  Tracheostomy  Intubation and extubation Antimicrobials:  Anti-infectives (From admission, onward)   Start     Dose/Rate Route Frequency Ordered Stop   06/25/19 1000  Ampicillin-Sulbactam (UNASYN) 3 g in sodium chloride 0.9 % 100 mL IVPB     3 g 200 mL/hr over 30 Minutes Intravenous Every 6 hours 06/25/19 0840     06/19/19 1800  cephALEXin (KEFLEX) 250 MG/5ML suspension 250  mg  Status:  Discontinued     250 mg Per Tube Every 6 hours 06/19/19 1539 06/24/19 1425   06/14/19 1500  ceFAZolin (ANCEF) IVPB 1 g/50 mL premix  Status:  Discontinued     1 g 100 mL/hr over 30 Minutes Intravenous Every 8 hours 06/14/19 1313 06/14/19 1333   06/14/19 1500  ceFAZolin (ANCEF) IVPB 2g/100 mL premix  Status:  Discontinued     2 g 200 mL/hr over 30 Minutes Intravenous Every 8 hours 06/14/19 1333  06/19/19 1539   06/13/19 2200  vancomycin (VANCOCIN) 1,500 mg in sodium chloride 0.9 % 500 mL IVPB  Status:  Discontinued     1,500 mg 250 mL/hr over 120 Minutes Intravenous Every 12 hours 06/13/19 0912 06/14/19 1313   06/13/19 0915  piperacillin-tazobactam (ZOSYN) IVPB 4.5 g  Status:  Discontinued     4.5 g 200 mL/hr over 30 Minutes Intravenous Every 8 hours 06/13/19 0900 06/14/19 1313   06/13/19 0915  vancomycin (VANCOCIN) 2,000 mg in sodium chloride 0.9 % 500 mL IVPB     2,000 mg 250 mL/hr over 120 Minutes Intravenous  Once 06/13/19 0912 06/14/19 0809   06/05/19 1630  ceFAZolin (ANCEF) IVPB 2g/100 mL premix     2 g 200 mL/hr over 30 Minutes Intravenous Every 8 hours 06/05/19 1628 06/10/19 1856   06/03/19 0100  Ampicillin-Sulbactam (UNASYN) 3 g in sodium chloride 0.9 % 100 mL IVPB  Status:  Discontinued     3 g 200 mL/hr over 30 Minutes Intravenous Every 6 hours 06/03/19 0008 06/05/19 1608     Objective: Vitals:   06/26/19 0328 06/26/19 0700 06/26/19 0831 06/26/19 1147  BP:  (!) 179/70    Pulse: 93 (!) 57 93 (!) 102  Resp: '11 19 18   '$ Temp:  97.8 F (36.6 C)  98.5 F (36.9 C)  TempSrc:  Axillary    SpO2: 97% 99%    Weight:      Height:        Intake/Output Summary (Last 24 hours) at 06/26/2019 1349 Last data filed at 06/26/2019 0028 Gross per 24 hour  Intake 451.98 ml  Output 2150 ml  Net -1698.02 ml   Filed Weights   06/23/19 0500 06/24/19 0421 06/25/19 0428  Weight: (!) 144.6 kg (!) 142.2 kg (!) 141.7 kg    Examination: General exam: Appears comfortable  HEENT: PERRLA, oral mucosa moist, no sclera icterus or thrush tracheostomy present-core track present Respiratory system: Clear to auscultation. Respiratory effort normal. Cardiovascular system: S1 & S2 heard,  No murmurs-tachycardia Gastrointestinal system: Abdomen soft, non-tender, nondistended. Normal bowel sounds   Central nervous system: Alert and oriented. No focal neurological deficits. Extremities: No  cyanosis, clubbing or edema Skin: No rashes or ulcers Psychiatry:  Mood & affect appropriate.     Data Reviewed: I have personally reviewed following labs and imaging studies  CBC: Recent Labs  Lab 06/21/19 0330 06/26/19 0445 06/26/19 0746  WBC 8.0 4.5 6.8  NEUTROABS 4.3  --   --   HGB 12.3* 18.2* 13.6  HCT 38.7* 56.0* 41.7  MCV 94.9 92.4 92.5  PLT 349 149* 176   Basic Metabolic Panel: Recent Labs  Lab 06/21/19 0330 06/26/19 0445  NA 137 137  K 4.2 4.2  CL 95* 97*  CO2 32 31  GLUCOSE 222* 168*  BUN 13 14  CREATININE 0.77 0.84  CALCIUM 8.6* 8.7*   GFR: Estimated Creatinine Clearance: 133.1 mL/min (by C-G formula based on SCr of 0.84 mg/dL). Liver  Function Tests: No results for input(s): AST, ALT, ALKPHOS, BILITOT, PROT, ALBUMIN in the last 168 hours. No results for input(s): LIPASE, AMYLASE in the last 168 hours. No results for input(s): AMMONIA in the last 168 hours. Coagulation Profile: No results for input(s): INR, PROTIME in the last 168 hours. Cardiac Enzymes: No results for input(s): CKTOTAL, CKMB, CKMBINDEX, TROPONINI in the last 168 hours. BNP (last 3 results) No results for input(s): PROBNP in the last 8760 hours. HbA1C: No results for input(s): HGBA1C in the last 72 hours. CBG: Recent Labs  Lab 06/25/19 2022 06/26/19 0023 06/26/19 0320 06/26/19 0819 06/26/19 1145  GLUCAP 168* 212* 160* 196* 204*   Lipid Profile: No results for input(s): CHOL, HDL, LDLCALC, TRIG, CHOLHDL, LDLDIRECT in the last 72 hours. Thyroid Function Tests: No results for input(s): TSH, T4TOTAL, FREET4, T3FREE, THYROIDAB in the last 72 hours. Anemia Panel: No results for input(s): VITAMINB12, FOLATE, FERRITIN, TIBC, IRON, RETICCTPCT in the last 72 hours. Urine analysis:    Component Value Date/Time   COLORURINE YELLOW 05/30/2019 0151   APPEARANCEUR HAZY (A) 05/30/2019 0151   LABSPEC 1.023 05/30/2019 0151   PHURINE 5.0 05/30/2019 0151   GLUCOSEU >=500 (A) 05/30/2019  0151   HGBUR MODERATE (A) 05/30/2019 0151   BILIRUBINUR NEGATIVE 05/30/2019 0151   KETONESUR 5 (A) 05/30/2019 0151   PROTEINUR 30 (A) 05/30/2019 0151   UROBILINOGEN 0.2 01/20/2008 1222   NITRITE NEGATIVE 05/30/2019 0151   LEUKOCYTESUR NEGATIVE 05/30/2019 0151   Sepsis Labs: '@LABRCNTIP'$ (procalcitonin:4,lacticidven:4) ) Recent Results (from the past 240 hour(s))  Culture, respiratory (non-expectorated)     Status: None (Preliminary result)   Collection Time: 06/24/19  3:10 PM   Specimen: Tracheal Aspirate; Respiratory  Result Value Ref Range Status   Specimen Description TRACHEAL ASPIRATE  Final   Special Requests NONE  Final   Gram Stain   Final    FEW SQUAMOUS EPITHELIAL CELLS PRESENT RARE WBC PRESENT, PREDOMINANTLY PMN MODERATE GRAM NEGATIVE RODS FEW GRAM POSITIVE COCCI RARE GRAM POSITIVE RODS    Culture   Final    CULTURE REINCUBATED FOR BETTER GROWTH Performed at [facility], [place] [address] [place], [place] [address]    Report Status PENDING  Incomplete         Radiology Studies: No results found.    Scheduled Meds: . amLODipine  10 mg Per Tube Daily  . aspirin  81 mg Per Tube Daily  . atorvastatin  40 mg Per Tube q1800  . atropine  2 drop Sublingual TID  . budesonide (PULMICORT) nebulizer solution  0.25 mg Nebulization BID  . chlorhexidine gluconate (MEDLINE KIT)  15 mL Mouth Rinse BID  . Chlorhexidine Gluconate Cloth  6 each Topical Daily  . clopidogrel  75 mg Per Tube Daily  . enoxaparin (LOVENOX) injection  70 mg Subcutaneous Daily  . famotidine  20 mg Per Tube BID  . feeding supplement (PRO-STAT SUGAR FREE 64)  30 mL Per Tube TID  . finasteride  5 mg Oral Daily  . glycopyrrolate  0.1 mg Intravenous TID  . glycopyrrolate  2 mg Per Tube BID  . insulin aspart  0-15 Units Subcutaneous Q4H  . insulin glargine  25 Units Subcutaneous Daily  . insulin starter kit- pen needles  1 kit Other Once  . mouth rinse  15 mL Mouth Rinse 10 times per day  .  QUEtiapine  100 mg Per Tube QHS  . QUEtiapine  50 mg Per Tube Daily  . scopolamine  1 patch  Transdermal Q72H  . sodium chloride flush  10-40 mL Intracatheter Q12H   Continuous Infusions: . ampicillin-sulbactam (UNASYN) IV 3 g (06/26/19 1003)  . feeding supplement (OSMOLITE 1.2 CAL) 1,000 mL (06/26/19 1303)     LOS: 27 days      Debbe Odea, MD [facility] Pager: [email or web address] Password [email or web address] 06/26/2019, 1:49 PM

## 2019-06-26 NOTE — Progress Notes (Signed)
Pt back to room from CT. P. Amo Ugonna Keirsey RN 

## 2019-06-27 ENCOUNTER — Other Ambulatory Visit: Payer: Self-pay

## 2019-06-27 ENCOUNTER — Inpatient Hospital Stay (HOSPITAL_COMMUNITY): Payer: BC Managed Care – PPO

## 2019-06-27 ENCOUNTER — Inpatient Hospital Stay (HOSPITAL_COMMUNITY)
Admission: RE | Admit: 2019-06-27 | Discharge: 2019-07-17 | DRG: 056 | Disposition: A | Discharge status: Home Health | Payer: BC Managed Care – PPO | Source: Intra-hospital | Attending: Physical Medicine & Rehabilitation | Admitting: Physical Medicine & Rehabilitation

## 2019-06-27 ENCOUNTER — Encounter (HOSPITAL_COMMUNITY): Payer: Self-pay | Admitting: *Deleted

## 2019-06-27 ENCOUNTER — Encounter (HOSPITAL_COMMUNITY): Payer: Self-pay | Admitting: Physical Medicine and Rehabilitation

## 2019-06-27 DIAGNOSIS — Z6841 Body Mass Index (BMI) 40.0 and over, adult: Secondary | ICD-10-CM

## 2019-06-27 DIAGNOSIS — E119 Type 2 diabetes mellitus without complications: Secondary | ICD-10-CM | POA: Diagnosis present

## 2019-06-27 DIAGNOSIS — E1165 Type 2 diabetes mellitus with hyperglycemia: Secondary | ICD-10-CM

## 2019-06-27 DIAGNOSIS — G463 Brain stem stroke syndrome: Secondary | ICD-10-CM | POA: Diagnosis present

## 2019-06-27 DIAGNOSIS — E1169 Type 2 diabetes mellitus with other specified complication: Secondary | ICD-10-CM | POA: Diagnosis not present

## 2019-06-27 DIAGNOSIS — D62 Acute posthemorrhagic anemia: Secondary | ICD-10-CM

## 2019-06-27 DIAGNOSIS — R4189 Other symptoms and signs involving cognitive functions and awareness: Secondary | ICD-10-CM | POA: Diagnosis not present

## 2019-06-27 DIAGNOSIS — G4733 Obstructive sleep apnea (adult) (pediatric): Secondary | ICD-10-CM | POA: Diagnosis present

## 2019-06-27 DIAGNOSIS — Z23 Encounter for immunization: Secondary | ICD-10-CM | POA: Diagnosis not present

## 2019-06-27 DIAGNOSIS — R0902 Hypoxemia: Secondary | ICD-10-CM | POA: Diagnosis not present

## 2019-06-27 DIAGNOSIS — J3800 Paralysis of vocal cords and larynx, unspecified: Secondary | ICD-10-CM | POA: Diagnosis present

## 2019-06-27 DIAGNOSIS — I10 Essential (primary) hypertension: Secondary | ICD-10-CM | POA: Diagnosis present

## 2019-06-27 DIAGNOSIS — B9561 Methicillin susceptible Staphylococcus aureus infection as the cause of diseases classified elsewhere: Secondary | ICD-10-CM | POA: Diagnosis present

## 2019-06-27 DIAGNOSIS — I69393 Ataxia following cerebral infarction: Secondary | ICD-10-CM | POA: Diagnosis not present

## 2019-06-27 DIAGNOSIS — I69391 Dysphagia following cerebral infarction: Secondary | ICD-10-CM | POA: Diagnosis present

## 2019-06-27 DIAGNOSIS — R059 Cough, unspecified: Secondary | ICD-10-CM

## 2019-06-27 DIAGNOSIS — T17908A Unspecified foreign body in respiratory tract, part unspecified causing other injury, initial encounter: Secondary | ICD-10-CM

## 2019-06-27 DIAGNOSIS — R278 Other lack of coordination: Secondary | ICD-10-CM | POA: Diagnosis present

## 2019-06-27 DIAGNOSIS — Z430 Encounter for attention to tracheostomy: Secondary | ICD-10-CM | POA: Diagnosis not present

## 2019-06-27 DIAGNOSIS — J9691 Respiratory failure, unspecified with hypoxia: Secondary | ICD-10-CM | POA: Diagnosis present

## 2019-06-27 DIAGNOSIS — R490 Dysphonia: Secondary | ICD-10-CM | POA: Diagnosis present

## 2019-06-27 DIAGNOSIS — E118 Type 2 diabetes mellitus with unspecified complications: Secondary | ICD-10-CM

## 2019-06-27 DIAGNOSIS — Z8673 Personal history of transient ischemic attack (TIA), and cerebral infarction without residual deficits: Secondary | ICD-10-CM | POA: Diagnosis present

## 2019-06-27 DIAGNOSIS — Z9049 Acquired absence of other specified parts of digestive tract: Secondary | ICD-10-CM | POA: Diagnosis not present

## 2019-06-27 DIAGNOSIS — J690 Pneumonitis due to inhalation of food and vomit: Secondary | ICD-10-CM | POA: Diagnosis present

## 2019-06-27 DIAGNOSIS — R339 Retention of urine, unspecified: Secondary | ICD-10-CM | POA: Diagnosis not present

## 2019-06-27 DIAGNOSIS — R000 Tachycardia, unspecified: Secondary | ICD-10-CM | POA: Diagnosis present

## 2019-06-27 DIAGNOSIS — R569 Unspecified convulsions: Secondary | ICD-10-CM | POA: Diagnosis not present

## 2019-06-27 DIAGNOSIS — J14 Pneumonia due to Hemophilus influenzae: Secondary | ICD-10-CM

## 2019-06-27 DIAGNOSIS — F419 Anxiety disorder, unspecified: Secondary | ICD-10-CM | POA: Diagnosis present

## 2019-06-27 DIAGNOSIS — R55 Syncope and collapse: Secondary | ICD-10-CM | POA: Diagnosis not present

## 2019-06-27 DIAGNOSIS — F600 Paranoid personality disorder: Secondary | ICD-10-CM | POA: Diagnosis present

## 2019-06-27 DIAGNOSIS — F22 Delusional disorders: Secondary | ICD-10-CM | POA: Diagnosis present

## 2019-06-27 DIAGNOSIS — R1313 Dysphagia, pharyngeal phase: Secondary | ICD-10-CM | POA: Diagnosis present

## 2019-06-27 DIAGNOSIS — R05 Cough: Secondary | ICD-10-CM

## 2019-06-27 DIAGNOSIS — R451 Restlessness and agitation: Secondary | ICD-10-CM | POA: Diagnosis not present

## 2019-06-27 DIAGNOSIS — J40 Bronchitis, not specified as acute or chronic: Secondary | ICD-10-CM | POA: Diagnosis present

## 2019-06-27 DIAGNOSIS — Z931 Gastrostomy status: Secondary | ICD-10-CM | POA: Diagnosis not present

## 2019-06-27 DIAGNOSIS — R1312 Dysphagia, oropharyngeal phase: Secondary | ICD-10-CM

## 2019-06-27 DIAGNOSIS — I639 Cerebral infarction, unspecified: Secondary | ICD-10-CM | POA: Diagnosis not present

## 2019-06-27 DIAGNOSIS — Z930 Tracheostomy status: Secondary | ICD-10-CM | POA: Diagnosis not present

## 2019-06-27 LAB — GLUCOSE, CAPILLARY
Glucose-Capillary: 167 mg/dL — ABNORMAL HIGH (ref 70–99)
Glucose-Capillary: 185 mg/dL — ABNORMAL HIGH (ref 70–99)
Glucose-Capillary: 172 mg/dL — ABNORMAL HIGH (ref 70–99)
Glucose-Capillary: 171 mg/dL — ABNORMAL HIGH (ref 70–99)
Glucose-Capillary: 119 mg/dL — ABNORMAL HIGH (ref 70–99)
Glucose-Capillary: 145 mg/dL — ABNORMAL HIGH (ref 70–99)

## 2019-06-27 MED ORDER — FLEET ENEMA 7-19 GM/118ML RE ENEM: 1.0000 | ENEMA | Freq: Once | RECTAL | Status: DC | PRN

## 2019-06-27 MED ORDER — QUETIAPINE FUMARATE 50 MG PO TABS
50.0000 mg | ORAL_TABLET | Freq: Every day | ORAL | Status: DC
  Administered 2019-06-28 – 2019-06-29 (×2): 50 mg
  Filled 2019-06-27 (×2): qty 1

## 2019-06-27 MED ORDER — ATORVASTATIN CALCIUM 40 MG PO TABS: 40.0000 mg | ORAL_TABLET | Freq: Every day | ORAL | Status: DC

## 2019-06-27 MED ORDER — FAMOTIDINE 40 MG/5ML PO SUSR
20.0000 mg | Freq: Two times a day (BID) | ORAL | Status: DC
  Administered 2019-06-27 – 2019-07-16 (×37): 20 mg
  Filled 2019-06-27 (×43): qty 2.5

## 2019-06-27 MED ORDER — ATORVASTATIN CALCIUM 40 MG PO TABS
40.0000 mg | ORAL_TABLET | Freq: Every day | ORAL | Status: DC
  Administered 2019-06-27 – 2019-07-16 (×20): 40 mg
  Filled 2019-06-27 (×20): qty 1

## 2019-06-27 MED ORDER — ENOXAPARIN SODIUM 80 MG/0.8ML ~~LOC~~ SOLN
70.0000 mg | SUBCUTANEOUS | Status: DC
  Administered 2019-06-28 – 2019-07-01 (×4): 70 mg via SUBCUTANEOUS
  Filled 2019-06-27 (×4): qty 0.7

## 2019-06-27 MED ORDER — INSULIN GLARGINE 100 UNIT/ML ~~LOC~~ SOLN: 25.0000 [IU] | Freq: Every day | SUBCUTANEOUS | 11 refills | Status: DC

## 2019-06-27 MED ORDER — INSULIN ASPART 100 UNIT/ML ~~LOC~~ SOLN
0.0000 [IU] | SUBCUTANEOUS | Status: DC
  Administered 2019-06-27: 2 [IU] via SUBCUTANEOUS
  Administered 2019-06-28: 20:00:00 5 [IU] via SUBCUTANEOUS
  Administered 2019-06-28 – 2019-06-29 (×5): 3 [IU] via SUBCUTANEOUS
  Administered 2019-06-29: 2 [IU] via SUBCUTANEOUS
  Administered 2019-06-29 – 2019-06-30 (×2): 3 [IU] via SUBCUTANEOUS
  Administered 2019-06-30: 2 [IU] via SUBCUTANEOUS
  Administered 2019-06-30: 8 [IU] via SUBCUTANEOUS
  Administered 2019-06-30 – 2019-07-01 (×2): 2 [IU] via SUBCUTANEOUS
  Administered 2019-07-01: 3 [IU] via SUBCUTANEOUS
  Administered 2019-07-01 (×2): 2 [IU] via SUBCUTANEOUS
  Administered 2019-07-02: 3 [IU] via SUBCUTANEOUS
  Administered 2019-07-02: 2 [IU] via SUBCUTANEOUS
  Administered 2019-07-02: 3 [IU] via SUBCUTANEOUS
  Administered 2019-07-02: 5 [IU] via SUBCUTANEOUS
  Administered 2019-07-02: 2 [IU] via SUBCUTANEOUS

## 2019-06-27 MED ORDER — BUDESONIDE 0.25 MG/2ML IN SUSP: 0.2500 mg | Freq: Two times a day (BID) | RESPIRATORY_TRACT | 12 refills | Status: DC

## 2019-06-27 MED ORDER — SCOPOLAMINE 1 MG/3DAYS TD PT72
1.0000 | MEDICATED_PATCH | TRANSDERMAL | Status: DC
  Administered 2019-06-27 – 2019-06-30 (×2): 1.5 mg via TRANSDERMAL
  Filled 2019-06-27 (×2): qty 1

## 2019-06-27 MED ORDER — ACETAMINOPHEN 160 MG/5ML PO SOLN: 650.0000 mg | ORAL | 0 refills | Status: DC | PRN

## 2019-06-27 MED ORDER — DOCUSATE SODIUM 50 MG/5ML PO LIQD: 100.0000 mg | Freq: Two times a day (BID) | ORAL | 0 refills | Status: DC | PRN

## 2019-06-27 MED ORDER — PRO-STAT SUGAR FREE PO LIQD
30.0000 mL | Freq: Three times a day (TID) | ORAL | Status: DC
  Administered 2019-06-27 – 2019-07-03 (×18): 30 mL
  Filled 2019-06-27 (×18): qty 30

## 2019-06-27 MED ORDER — SENNOSIDES-DOCUSATE SODIUM 8.6-50 MG PO TABS: 1.0000 | ORAL_TABLET | Freq: Every evening | ORAL | Status: DC | PRN

## 2019-06-27 MED ORDER — INSULIN GLARGINE 100 UNIT/ML ~~LOC~~ SOLN
25.0000 [IU] | Freq: Every day | SUBCUTANEOUS | Status: DC
  Administered 2019-06-28 – 2019-06-29 (×2): 25 [IU] via SUBCUTANEOUS
  Filled 2019-06-27 (×2): qty 0.25

## 2019-06-27 MED ORDER — CHLORHEXIDINE GLUCONATE 0.12% ORAL RINSE (MEDLINE KIT)
15.0000 mL | Freq: Two times a day (BID) | OROMUCOSAL | Status: DC
  Administered 2019-06-27 – 2019-07-17 (×34): 15 mL via OROMUCOSAL

## 2019-06-27 MED ORDER — BISACODYL 10 MG RE SUPP
10.0000 mg | Freq: Every day | RECTAL | Status: DC | PRN
  Administered 2019-06-28 – 2019-07-10 (×4): 10 mg via RECTAL
  Filled 2019-06-27 (×4): qty 1

## 2019-06-27 MED ORDER — SODIUM CHLORIDE 0.9% FLUSH
10.0000 mL | Freq: Two times a day (BID) | INTRAVENOUS | Status: DC
  Administered 2019-06-27 – 2019-07-13 (×24): 10 mL

## 2019-06-27 MED ORDER — FINASTERIDE 5 MG PO TABS
5.0000 mg | ORAL_TABLET | Freq: Every day | ORAL | Status: DC
  Administered 2019-06-28 – 2019-07-17 (×19): 5 mg via ORAL
  Filled 2019-06-27 (×20): qty 1

## 2019-06-27 MED ORDER — GLYCOPYRROLATE 1 MG PO TABS
2.0000 mg | ORAL_TABLET | Freq: Two times a day (BID) | ORAL | Status: DC
  Administered 2019-06-27 – 2019-07-02 (×10): 2 mg
  Filled 2019-06-27 (×10): qty 2

## 2019-06-27 MED ORDER — CLOPIDOGREL BISULFATE 75 MG PO TABS
75.0000 mg | ORAL_TABLET | Freq: Every day | ORAL | Status: DC
  Administered 2019-06-28: 75 mg
  Filled 2019-06-27: qty 1

## 2019-06-27 MED ORDER — SODIUM CHLORIDE 0.9 % IV SOLN
2.0000 g | INTRAVENOUS | Status: DC
  Administered 2019-06-27: 2 g via INTRAVENOUS
  Filled 2019-06-27: qty 2

## 2019-06-27 MED ORDER — PROCHLORPERAZINE 25 MG RE SUPP: 12.5000 mg | Freq: Four times a day (QID) | RECTAL | Status: DC | PRN

## 2019-06-27 MED ORDER — ALUM & MAG HYDROXIDE-SIMETH 200-200-20 MG/5ML PO SUSP: 30.0000 mL | ORAL | Status: DC | PRN

## 2019-06-27 MED ORDER — GLYCOPYRROLATE 1 MG PO TABS: 2.0000 mg | ORAL_TABLET | Freq: Two times a day (BID) | ORAL | Status: DC

## 2019-06-27 MED ORDER — GLYCOPYRROLATE 0.2 MG/ML IJ SOLN
0.2000 mg | Freq: Three times a day (TID) | INTRAMUSCULAR | Status: DC
  Filled 2019-06-27 (×2): qty 1

## 2019-06-27 MED ORDER — ENOXAPARIN SODIUM 80 MG/0.8ML ~~LOC~~ SOLN: 70.0000 mg | Freq: Every day | SUBCUTANEOUS | Status: DC

## 2019-06-27 MED ORDER — ORAL CARE MOUTH RINSE
15.0000 mL | OROMUCOSAL | Status: DC
  Administered 2019-06-27 – 2019-07-17 (×124): 15 mL via OROMUCOSAL

## 2019-06-27 MED ORDER — SODIUM CHLORIDE 0.9 % IV SOLN
2.0000 g | INTRAVENOUS | Status: AC
  Administered 2019-06-28 – 2019-07-01 (×4): 2 g via INTRAVENOUS
  Filled 2019-06-27 (×2): qty 2
  Filled 2019-06-27 (×2): qty 20
  Filled 2019-06-27: qty 2

## 2019-06-27 MED ORDER — PROCHLORPERAZINE EDISYLATE 10 MG/2ML IJ SOLN
5.0000 mg | Freq: Four times a day (QID) | INTRAMUSCULAR | Status: DC | PRN
  Administered 2019-07-01: 5 mg via INTRAMUSCULAR
  Filled 2019-06-27: qty 2

## 2019-06-27 MED ORDER — PROCHLORPERAZINE MALEATE 5 MG PO TABS
5.0000 mg | ORAL_TABLET | Freq: Four times a day (QID) | ORAL | Status: DC | PRN
  Administered 2019-07-05: 5 mg via NASOGASTRIC
  Filled 2019-06-27: qty 1

## 2019-06-27 MED ORDER — SCOPOLAMINE 1 MG/3DAYS TD PT72: 1.0000 | MEDICATED_PATCH | TRANSDERMAL | 12 refills | Status: DC

## 2019-06-27 MED ORDER — OSMOLITE 1.2 CAL PO LIQD: 1000.0000 mL | ORAL | 0 refills | Status: DC

## 2019-06-27 MED ORDER — ASPIRIN 81 MG PO CHEW
81.0000 mg | CHEWABLE_TABLET | Freq: Every day | ORAL | Status: DC
  Administered 2019-06-28 – 2019-07-17 (×19): 81 mg
  Filled 2019-06-27 (×19): qty 1

## 2019-06-27 MED ORDER — LIVING WELL WITH DIABETES BOOK
Freq: Once | Status: AC
  Administered 2019-06-27: 19:00:00
  Filled 2019-06-27: qty 1

## 2019-06-27 MED ORDER — QUETIAPINE FUMARATE 100 MG PO TABS: 100.0000 mg | ORAL_TABLET | Freq: Every day | ORAL | Status: DC

## 2019-06-27 MED ORDER — QUETIAPINE FUMARATE 50 MG PO TABS
100.0000 mg | ORAL_TABLET | Freq: Every day | ORAL | Status: DC
  Administered 2019-06-27 – 2019-06-29 (×3): 100 mg
  Filled 2019-06-27 (×3): qty 2

## 2019-06-27 MED ORDER — SODIUM CHLORIDE 0.9 % IV SOLN: 2.0000 g | INTRAVENOUS | Status: DC

## 2019-06-27 MED ORDER — BUDESONIDE 0.25 MG/2ML IN SUSP
0.2500 mg | Freq: Two times a day (BID) | RESPIRATORY_TRACT | Status: DC
  Administered 2019-06-27 – 2019-07-05 (×15): 0.25 mg via RESPIRATORY_TRACT
  Filled 2019-06-27 (×16): qty 2

## 2019-06-27 MED ORDER — FAMOTIDINE 40 MG/5ML PO SUSR: 20.0000 mg | Freq: Two times a day (BID) | ORAL | 0 refills | Status: DC

## 2019-06-27 MED ORDER — CLOPIDOGREL BISULFATE 75 MG PO TABS: 75.0000 mg | ORAL_TABLET | Freq: Every day | ORAL | Status: DC

## 2019-06-27 MED ORDER — OSMOLITE 1.2 CAL PO LIQD: 1000.0000 mL | ORAL | Status: DC

## 2019-06-27 MED ORDER — ASPIRIN 81 MG PO CHEW: 81.0000 mg | CHEWABLE_TABLET | Freq: Every day | ORAL | Status: DC

## 2019-06-27 MED ORDER — BLOOD PRESSURE CONTROL BOOK
Freq: Once | Status: AC
  Administered 2019-06-27: 19:00:00
  Filled 2019-06-27: qty 1

## 2019-06-27 MED ORDER — INSULIN ASPART 100 UNIT/ML ~~LOC~~ SOLN: 0.0000 [IU] | SUBCUTANEOUS | 11 refills | Status: DC

## 2019-06-27 MED ORDER — QUETIAPINE FUMARATE 50 MG PO TABS: 50.0000 mg | ORAL_TABLET | Freq: Every day | ORAL | Status: DC

## 2019-06-27 MED ORDER — CHLORHEXIDINE GLUCONATE CLOTH 2 % EX PADS
6.0000 | MEDICATED_PAD | Freq: Every day | CUTANEOUS | Status: DC
  Administered 2019-06-28 – 2019-07-02 (×5): 6 via TOPICAL

## 2019-06-27 MED ORDER — BISACODYL 10 MG RE SUPP: 10.0000 mg | Freq: Every day | RECTAL | 0 refills | Status: DC | PRN

## 2019-06-27 MED ORDER — PRO-STAT SUGAR FREE PO LIQD: 30.0000 mL | Freq: Three times a day (TID) | ORAL | 0 refills | Status: DC

## 2019-06-27 MED ORDER — ACETAMINOPHEN 325 MG PO TABS
325.0000 mg | ORAL_TABLET | ORAL | Status: DC | PRN
  Administered 2019-06-29 – 2019-07-05 (×7): 650 mg via NASOGASTRIC
  Filled 2019-06-27 (×7): qty 2

## 2019-06-27 MED ORDER — AMLODIPINE BESYLATE 10 MG PO TABS
10.0000 mg | ORAL_TABLET | Freq: Every day | ORAL | Status: DC
  Administered 2019-06-28 – 2019-07-17 (×19): 10 mg
  Filled 2019-06-27 (×19): qty 1

## 2019-06-27 MED ORDER — INFLUENZA VAC SPLIT QUAD 0.5 ML IM SUSY
0.5000 mL | PREFILLED_SYRINGE | INTRAMUSCULAR | Status: AC
  Administered 2019-06-28: 0.5 mL via INTRAMUSCULAR
  Filled 2019-06-27: qty 0.5

## 2019-06-27 MED ORDER — FINASTERIDE 5 MG PO TABS: 5.0000 mg | ORAL_TABLET | Freq: Every day | ORAL | Status: DC

## 2019-06-27 MED ORDER — AMLODIPINE BESYLATE 10 MG PO TABS: 10.0000 mg | ORAL_TABLET | Freq: Every day | ORAL | Status: DC

## 2019-06-27 MED ORDER — SODIUM CHLORIDE 0.9% FLUSH
10.0000 mL | INTRAVENOUS | Status: DC | PRN
  Administered 2019-06-30: 10 mL
  Filled 2019-06-27: qty 40

## 2019-06-27 NOTE — Progress Notes (Signed)
Pt alert and responsive; pt trach care completed and inner canula cleansed; midline dsg completed with RN Merleen Nicely per protocol. Pt discharge to CIR and report called off to nurse in [place]. Pt transported off unit via bed with belongings to the side along with cork trak intact and infusing; trach with trach collar on and RT assisting in transferring pt off unit. Pt spouse at bed side. Pt discharge to 4W. Delia Heady RN

## 2019-06-27 NOTE — Progress Notes (Signed)
Pt back to room from procedure. P. Amo Brian Zeitlin RN 

## 2019-06-27 NOTE — Progress Notes (Signed)
Occupational Therapy Treatment Patient Details Name: Michael Bright MRN: [id number] DOB: [birth year] Today's Date: 06/27/2019    History of present illness Pt adm with lt sided weakness and numbness. Pt with lt medullary infarct. Intubated 8/15 due to suspected aspiration. trach 8/21. PMH -  DM, HTN   OT comments  Pt performing HEP in LUE with AROM: shoulder flex, shoulder abduction, elbow flex/ext and digit abduction/adduction with finger flex/ext, 10 reps, 1 set. 15 squeezes. Pt performing  Tasks with fair to poor [facility] today and good carryover skills for proper technique. Pt moving head around to see therapist hand for demonstration- vision to continue to be addressed. Pt would benefit from continued OT skilled services in CIR setting. OT following acutely. Pt discharge to CIR today.   Follow Up Recommendations  CIR;Supervision/Assistance - 24 hour    Equipment Recommendations  Other (comment)(to be determined in next venue)    Recommendations for Other Services      Precautions / Restrictions Precautions Precautions: Fall;Other (comment) Precaution Comments: cortrak, watch BP, trach collar; PMV with therapies Restrictions Weight Bearing Restrictions: No       Mobility Bed Mobility Overal bed mobility: Needs Assistance                Transfers Overall transfer level: Needs assistance                    Balance Overall balance assessment: Needs assistance                                         ADL either performed or assessed with clinical judgement   ADL Overall ADL's : Needs assistance/impaired Eating/Feeding: NPO                                     General ADL Comments: Pt given [place] and [place] tasks in HEP with decreased activity tolerance and tactile and verbal cueing for proper technique.     Vision   Vision Assessment?: Vision impaired- to be further tested in functional context Additional Comments: Pt having to  move head to see therapist's hands   Perception     Praxis      Cognition Arousal/Alertness: Awake/alert Behavior During Therapy: WFL for tasks assessed/performed Overall Cognitive Status: Impaired/Different from baseline Area of Impairment: Safety/judgement                         Safety/Judgement: Decreased awareness of safety;Decreased awareness of deficits              Exercises Other Exercises Other Exercises: shoulder flex , shoulder abduction, elbow flex/ext and digit abduction/adduction with finger flex/ext, 10 reps, 1 set. 15 squeezes.   Shoulder Instructions       General Comments      Pertinent Vitals/ Pain       Pain Assessment: No/denies pain  Home Living                                          Prior Functioning/Environment              Frequency  Min 2X/week        Progress Toward Goals  OT Goals(current goals can now be found in the care plan section)  Progress towards OT goals: Progressing toward goals  Acute Rehab OT Goals Patient Stated Goal: go to rehab OT Goal Formulation: With patient Time For Goal Achievement: 07/05/19 ADL Goals Pt Will Perform Grooming: with min assist;standing Pt Will Perform Upper Body Bathing: with min guard assist;sitting Pt Will Perform Lower Body Bathing: with min guard assist;sit to/from stand;sitting/lateral leans Pt Will Perform Upper Body Dressing: with min guard assist;sitting;standing Pt Will Perform Lower Body Dressing: with min guard assist;sitting/lateral leans;sit to/from stand Pt Will Transfer to Toilet: with min assist;bedside commode;ambulating  Plan Discharge plan remains appropriate    Co-evaluation                 AM-PAC OT "6 Clicks" Daily Activity     Outcome Measure   Help from another person eating meals?: A Lot Help from another person taking care of personal grooming?: A Lot Help from another person toileting, which includes using toliet,  bedpan, or urinal?: A Lot Help from another person bathing (including washing, rinsing, drying)?: A Lot Help from another person to put on and taking off regular upper body clothing?: A Little Help from another person to put on and taking off regular lower body clothing?: A Lot 6 Click Score: 13    End of Session    OT Visit Diagnosis: Unsteadiness on feet (R26.81);Other abnormalities of gait and mobility (R26.89);Muscle weakness (generalized) (M62.81);History of falling (Z91.81);Other symptoms and signs involving the nervous system (R29.898)   Activity Tolerance Patient tolerated treatment well   Patient Left in bed;with call bell/phone within reach;with bed alarm set   Nurse Communication Mobility status        Time: [id number] OT Time Calculation (min): 16 min  Charges: OT General Charges $OT Visit: 1 Visit OT Treatments $Therapeutic Exercise: 8-22 mins  Michael Bright Acute Rehabilitation Services Pager: [phone] Office: [phone]    Michael Bright 06/27/2019, 3:54 PM

## 2019-06-27 NOTE — Progress Notes (Signed)
Pt arrived to 4W via bed escorted by nursing staff and spouse. Pt verbalized understanding of rehab procedures, personal belongings policy, fall prevention policy, and visitor policy. Pt in no immediate distress. Cortrak intact and infusing , Midline intact and infusing, trach collar on. pt arrived with condom cath in place. Pt spouse at bedside. Call bell within reach, will continue to monitor. Larina Earthly, LPN

## 2019-06-27 NOTE — Discharge Summary (Signed)
Physician Discharge Summary  Michael KilRandolph Dearmond NFA:[id number] DOB: 1956-05-29 DOA: 05/30/2019  PCP: Patient, No Pcp Per  Admit date: 05/30/2019 Discharge date: 06/27/2019  Admitted From: home  Disposition:  CIR   Recommendations for Outpatient Follow-up:  1. 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 PCCM was called due to poor secretion management on 8/12. Complicated by dysphagia, aspiration pneumonia and acute hypoxic respiratory failure requiring intubation on 8/15 and mechanical ventilation. He was unable to be weaned from the ventilator and underwent a tracheostomy on 8/21. He was transferred to triad hospitalist service on 8/26.   Hospital Course:  Principal Problem:   Acute CVA (cerebrovascular accident)  -With left lateral medullary syndrome, dysphagia, dysarthria, left facial droop, double vision, unsteady gait -Has been evaluated by the stroke team - Plan to continue aspirin and Plavix for 3 months and then aspirin alone -He will go to: Inpatient rehab    Active Problems:    Acute respiratory failure  Dysphasia due to CVA with aspiration pneumonia    - aspiration on tube feeds resulted in pneumonia - treated with Unasyn (started 8/15) by PCCM - Trach placed on 8/21- the is meant to be chronic per PCCM -He has recuperated from the pneumonia but continues to have a large amount of sputum production from trach and was being treated for MSSA Tracheobronchitis - PCCM signed off on 8/30 - I added a scopolamine patch  -he is already on Robinul which I have increased but he still has increased secretions - CXR on 9/3  negative for pneumonia  >> added IV Robinul  & pulmicort nebs - per RN today, not helping  - previously grew MSSA in sputum culture and has been treated with Ancef/Keflex - 9/6- sent another sputum culture which shows large amount of gram neg rods- -added Unasyn- follow culture - added Atropine drops -9/7<><>sputum growing Haemophilus influenza- after starting Unasyn and atropine, his secretions are decreasing- we will hold atropine to see if it is Unasyn that has helped - CT scan today>>> b/l atelectasis and consolidation - cont Unasyn - 9/8>> narrow antibiotics to Ceftriaxone- d/c IV robinul as his diagnosis is b/l pneumonia  Tachycardia hypoxia with pulse ox of 87 on exertion on 9/7 -D-dimer elevated at 2 - CT of the chest to rule out PE negative but did confirm b/l consolidation - stopped Atropine drops - HR down to 90s today  Dysphagia - cor trac placed on 8/13- remain NPO per SLP-   --SLP  performed modified barium swallow today as secretions are improving- please f/u for results    Essential hypertension -Added amlodipine per tube- BP better  Type 2 diabetes mellitus with hyperlipidemia (HCC) -A1c 10.6 on 05/30/2019 -Continue Lantus and subcu insulin-    Discharge Exam: Vitals:   06/27/19 0746 06/27/19 1109  BP: 114/64 123/90  Pulse: 98 (!) 103  Resp: 16 20  Temp: 98.2 F (36.8 C) 98.4 F (36.9 C)  SpO2: 97% 98%   Vitals:   06/27/19 0308 06/27/19 0430 06/27/19 0746 06/27/19 1109  BP:  133/86  114/64 123/90  Pulse: 96 87 98 (!) 103  Resp: 19 11 16 20   Temp:  97.6 F (36.4 C) 98.2 F (36.8 C) 98.4 F (36.9 C)  TempSrc:  Oral Oral Oral  SpO2:  96% 97% 98%  Weight:      Height:        General: Pt is alert, awake, not in acute distress Cardiovascular: RRR, S1/S2 +, no rubs, no gallops Respiratory: CTA bilaterally, no wheezing, no rhonchi Abdominal: Soft, NT, ND, bowel sounds + Extremities: no edema, no cyanosis   Discharge Instructions  Discharge Instructions    Ambulatory referral to Neurology   Complete by: As directed    Follow up with stroke clinic NP (Jessica Vanschaick or Darrol Angel, if both not available, consider Manson Allan, or [facility]) at [facility] in about 4 weeks. Thanks.   Increase activity slowly   Complete by: As directed      Allergies as of 06/27/2019   No Known Allergies     Medication List    TAKE these medications   acetaminophen 160 MG/5ML solution Commonly known as: TYLENOL Place 20.3 mLs (650 mg total) into feeding tube every 4 (four) hours as needed for mild pain (or temp > 37.5 C (99.5 F)).   amLODipine 10 MG tablet Commonly known as: NORVASC Place 1 tablet (10 mg total) into feeding tube daily. Start taking on: June 28, 2019   aspirin 81 MG chewable tablet Place 1 tablet (81 mg total) into feeding tube daily. Start taking on: June 28, 2019   atorvastatin 40 MG tablet Commonly known as: LIPITOR Place 1 tablet (40 mg total) into feeding tube daily at 6 PM.   bisacodyl 10 MG suppository Commonly known as: DULCOLAX Place 1 suppository (10 mg total) rectally daily as needed for moderate constipation.   budesonide 0.25 MG/2ML nebulizer solution Commonly known as: PULMICORT Take 2 mLs (0.25 mg total) by nebulization 2 (two) times daily.   cefTRIAXone 2 g in sodium chloride 0.9 % 100 mL Inject 2 g into the vein daily. Start taking on: June 28, 2019   clopidogrel 75 MG tablet Commonly known as: PLAVIX Place 1 tablet  (75 mg total) into feeding tube daily. Start taking on: June 28, 2019   docusate 50 MG/5ML liquid Commonly known as: COLACE Place 10 mLs (100 mg total) into feeding tube 2 (two) times daily as needed for mild constipation.   enoxaparin 80 MG/0.8ML injection Commonly known as: LOVENOX Inject 0.7 mLs (70 mg total) into the skin daily. Start taking on: June 28, 2019   famotidine 40 MG/5ML suspension Commonly known as: PEPCID Place 2.5 mLs (20 mg total) into feeding tube 2 (two) times daily.   feeding supplement (OSMOLITE 1.2 CAL) Liqd Place 1,000 mLs into feeding tube continuous.   feeding supplement (PRO-STAT SUGAR FREE 64) Liqd Place 30 mLs into feeding tube 3 (three) times daily.   finasteride 5 MG tablet Commonly known as: PROSCAR Take 1 tablet (5 mg total) by mouth daily. Start taking on: June 28, 2019   glycopyrrolate 1 MG tablet  Commonly known as: ROBINUL Place 2 tablets (2 mg total) into feeding tube 2 (two) times daily.   insulin aspart 100 UNIT/ML injection Commonly known as: novoLOG Inject 0-15 Units into the skin every 4 (four) hours.   insulin glargine 100 UNIT/ML injection Commonly known as: LANTUS Inject 0.25 mLs (25 Units total) into the skin daily. Start taking on: June 28, 2019   QUEtiapine 100 MG tablet Commonly known as: SEROQUEL Place 1 tablet (100 mg total) into feeding tube at bedtime.   QUEtiapine 50 MG tablet Commonly known as: SEROQUEL Place 1 tablet (50 mg total) into feeding tube daily. Start taking on: June 28, 2019   scopolamine 1 MG/3DAYS Commonly known as: TRANSDERM-SCOP Place 1 patch (1.5 mg total) onto the skin every 3 (three) days.   senna-docusate 8.6-50 MG tablet Commonly known as: Senokot-S Place 1 tablet into feeding tube at bedtime as needed for mild constipation or moderate constipation.      Follow-up Information    [facility]. Schedule an appointment as soon as possible for a  visit in 4 week(s).   Specialty: Neurology Contact information: [address] [place] [address] [phone]       [facility]. Call.   Why: Call this number or the number on the back of your insurance card to find a primary care provider accepting your insurance Contact information: [phone]         No Known Allergies   Procedures/Studies:   Ct Angio Head W Or Wo Contrast  Result Date: 05/30/2019 CLINICAL DATA:  Left leg weakness EXAM: CT ANGIOGRAPHY HEAD AND NECK TECHNIQUE: Multidetector CT imaging of the head and neck was performed using the standard protocol during bolus administration of intravenous contrast. Multiplanar CT image reconstructions and MIPs were obtained to evaluate the vascular anatomy. Carotid stenosis measurements (when applicable) are obtained utilizing NASCET criteria, using the distal internal carotid diameter as the denominator. CONTRAST:  OMNIPAQUE IOHEXOL 350 MG/ML SOLN COMPARISON:  CT and MRI head 05/30/2019 FINDINGS: CTA NECK FINDINGS Aortic arch: Standard branching. Imaged portion shows no evidence of aneurysm or dissection. No significant stenosis of the major arch vessel origins. Right carotid system: Mild atherosclerotic disease right carotid bulb without significant stenosis. Left carotid system: Mild atherosclerotic disease left carotid bifurcation without significant stenosis. Vertebral arteries: Left vertebral artery dominant. Mild calcific stenosis origin of left vertebral artery. Right vertebral artery is small and ends in PICA. Skeleton:   Right lower molar periapical lucency. Other neck: Negative for mass or adenopathy in the neck. Mild thyroid enlargement. Upper chest: Lung apices clear bilaterally. Review of the MIP images confirms the above findings CTA HEAD FINDINGS Anterior circulation: Atherosclerotic disease in the cavernous carotid bilaterally. Moderate supraclinoid carotid stenosis bilaterally due to  atherosclerotic disease. Atherosclerotic irregularity in the anterior and middle cerebral arteries bilaterally diffusely but without significant stenosis or large vessel occlusion. Posterior circulation: Left vertebral artery is dominant and supplies the basilar. Right vertebral artery is small and ends in PICA. Basilar widely patent. Superior cerebellar and posterior cerebral arteries patent bilaterally. Venous sinuses: Patent Anatomic variants: None Review of the MIP images confirms the above findings IMPRESSION: 1. Negative for emergent large vessel occlusion. Diffuse intracranial atherosclerotic disease. Moderate stenosis supraclinoid internal carotid artery bilaterally 2. Carotid artery widely patent in the neck bilaterally with mild atherosclerotic disease at the bifurcation bilaterally 3. Left vertebral artery dominant with mild stenosis at the origin. Electronically Signed   By: Marlan Palau M.D.  On: 05/30/2019 11:08   Dg Chest 1 View  Result Date: 06/13/2019 CLINICAL DATA:  Fever. EXAM: CHEST  1 VIEW COMPARISON:  Single-view of the chest 06/10/2019, 06/09/2019 and 06/04/2019. FINDINGS: Tracheostomy tube and feeding tube are in place. Mild bibasilar opacities are improved comparison to yesterday's examination. No pneumothorax or pleural effusion. Cardiomegaly. IMPRESSION: Improved bibasilar opacities compared to the most recent examination most compatible with decreased atelectasis. No new abnormality. Electronically Signed   By: Drusilla Kanner M.D.   On: 06/13/2019 10:27   Dg Chest 2 View  Result Date: 05/30/2019 CLINICAL DATA:  Left-sided facial numbness. EXAM: CHEST - 2 VIEW COMPARISON:  None. FINDINGS: Mild cardiomegaly is noted. No pneumothorax or pleural effusion is noted. Both lungs are clear. The visualized skeletal structures are unremarkable. IMPRESSION: No active cardiopulmonary disease. Electronically Signed   By: Lupita Raider M.D.   On: 05/30/2019 10:16   Dg Abd 1  View  Result Date: 06/12/2019 CLINICAL DATA:  Reason for exam: vomiting Patient reports vomiting started last night. And has had constipation for more than 1 week. Denies any abdomen surgeries. Patient not marked for isolation. Tech wore mask and shield. EXAM: ABDOMEN - 1 VIEW COMPARISON:  06/10/2019 FINDINGS: The bowel gas pattern is normal. No radio-opaque calculi or other significant radiographic abnormality are seen. IMPRESSION: Negative. Electronically Signed   By: Corlis Leak M.D.   On: 06/12/2019 08:13   Dg Abd 1 View  Result Date: 06/03/2019 CLINICAL DATA:  feeding tube dysfunction EXAM: ABDOMEN - 1 VIEW COMPARISON:  Radiograph 06/01/2019 FINDINGS: A feeding tube with tip in the distal in stomach/first portion duodenum. Tube unchanged in position from 06/01/2019. No evidence obstruction. Contrast within the nondistended colon. IMPRESSION: Feeding tube in the distal stomach/first portion duodenum. No apparent changed position. Electronically Signed   By: Genevive Bi M.D.   On: 06/03/2019 11:35   Dg Abd 1 View  Result Date: 06/01/2019 CLINICAL DATA:  Feeding tube placement EXAM: ABDOMEN - 1 VIEW COMPARISON:  None. FINDINGS: Feeding tube tip passes through the body of the stomach to the region of the pylorus. Small amount of contrast injected appears to fill the duodenum. Normal bowel gas pattern. IMPRESSION: Feeding tube tip in the region the pylorus, possibly proximal duodenum. Electronically Signed   By: Marlan Palau M.D.   On: 06/01/2019 09:51   Ct Head Wo Contrast  Result Date: 05/30/2019 CLINICAL DATA:  Leg weakness EXAM: CT HEAD WITHOUT CONTRAST TECHNIQUE: Contiguous axial images were obtained from the base of the skull through the vertex without intravenous contrast. COMPARISON:  None. FINDINGS: Brain: No evidence of acute infarction, hemorrhage, hydrocephalus, extra-axial collection or mass lesion/mass effect. Mild subcortical white matter and periventricular small vessel ischemic  changes. Vascular: Mild intracranial atherosclerosis. Skull: Normal. Negative for fracture or focal lesion. Sinuses/Orbits: The visualized paranasal sinuses are essentially clear. The mastoid air cells are unopacified. Other: None. IMPRESSION: No evidence of acute intracranial abnormality. Mild small vessel ischemic changes. Electronically Signed   By: Charline Bills M.D.   On: 05/30/2019 02:22   Ct Angio Neck W Or Wo Contrast  Result Date: 05/30/2019 CLINICAL DATA:  Left leg weakness EXAM: CT ANGIOGRAPHY HEAD AND NECK TECHNIQUE: Multidetector CT imaging of the head and neck was performed using the standard protocol during bolus administration of intravenous contrast. Multiplanar CT image reconstructions and MIPs were obtained to evaluate the vascular anatomy. Carotid stenosis measurements (when applicable) are obtained utilizing NASCET criteria, using the distal internal carotid diameter as the  denominator. CONTRAST:  100mL OMNIPAQUE IOHEXOL 350 MG/ML SOLN COMPARISON:  CT and MRI head 05/30/2019 FINDINGS: CTA NECK FINDINGS Aortic arch: Standard branching. Imaged portion shows no evidence of aneurysm or dissection. No significant stenosis of the major arch vessel origins. Right carotid system: Mild atherosclerotic disease right carotid bulb without significant stenosis. Left carotid system: Mild atherosclerotic disease left carotid bifurcation without significant stenosis. Vertebral arteries: Left vertebral artery dominant. Mild calcific stenosis origin of left vertebral artery. Right vertebral artery is small and ends in PICA. Skeleton:   Right lower molar periapical lucency. Other neck: Negative for mass or adenopathy in the neck. Mild thyroid enlargement. Upper chest: Lung apices clear bilaterally. Review of the MIP images confirms the above findings CTA HEAD FINDINGS Anterior circulation: Atherosclerotic disease in the cavernous carotid bilaterally. Moderate supraclinoid carotid stenosis bilaterally due  to atherosclerotic disease. Atherosclerotic irregularity in the anterior and middle cerebral arteries bilaterally diffusely but without significant stenosis or large vessel occlusion. Posterior circulation: Left vertebral artery is dominant and supplies the basilar. Right vertebral artery is small and ends in PICA. Basilar widely patent. Superior cerebellar and posterior cerebral arteries patent bilaterally. Venous sinuses: Patent Anatomic variants: None Review of the MIP images confirms the above findings IMPRESSION: 1. Negative for emergent large vessel occlusion. Diffuse intracranial atherosclerotic disease. Moderate stenosis supraclinoid internal carotid artery bilaterally 2. Carotid artery widely patent in the neck bilaterally with mild atherosclerotic disease at the bifurcation bilaterally 3. Left vertebral artery dominant with mild stenosis at the origin. Electronically Signed   By: Marlan Palauharles  Clark M.D.   On: 05/30/2019 11:08   Ct Angio Chest Pe W Or Wo Contrast  Result Date: 06/26/2019 CLINICAL DATA:  High probability for pulmonary embolus. Acute respiratory failure. EXAM: CT ANGIOGRAPHY CHEST WITH CONTRAST TECHNIQUE: Multidetector CT imaging of the chest was performed using the standard protocol during bolus administration of intravenous contrast. Multiplanar CT image reconstructions and MIPs were obtained to evaluate the vascular anatomy. CONTRAST:  80mL OMNIPAQUE IOHEXOL 350 MG/ML SOLN COMPARISON:  None FINDINGS: Cardiovascular: No acute abnormality. The main pulmonary artery is patent. No saddle embolus or central obstructing pulmonary embolus identified. No lobar or segmental pulmonary artery filling defects identified. Normal heart size. No pericardial effusion. Mild aortic atherosclerosis. Mediastinum/Nodes: Tracheostomy tube tip is above the carina. There is a feeding tube in place. No enlarged mediastinal or hilar adenopathy. Lungs/Pleura: No pleural effusion. Bilateral lower lobe atelectasis,  consolidation and volume loss identified. Lingular atelectasis is noted. Upper Abdomen: No acute abnormality. Musculoskeletal: Spondylosis identified. Review of the MIP images confirms the above findings. IMPRESSION: 1. No evidence for acute pulmonary embolus. 2. Bilateral lower lobe atelectasis, airspace consolidation and volume loss. Lingular atelectasis also noted. Electronically Signed   By: Signa Kellaylor  Stroud M.D.   On: 06/26/2019 15:37   Mr Brain Wo Contrast  Result Date: 05/30/2019 CLINICAL DATA:  Leg weakness EXAM: MRI HEAD WITHOUT CONTRAST TECHNIQUE: Multiplanar, multiecho pulse sequences of the brain and surrounding structures were obtained without intravenous contrast. COMPARISON:  None. FINDINGS: Brain: 1 cm acute infarct in the lateral left medulla. Small vessel ischemic type change in the deep cerebral white matter and to a lesser extent in the pons, mild. No acute hemorrhage, hydrocephalus, or masslike finding. Negative for atrophy Vascular: Major flow voids are preserved. Strongly dominant left vertebral artery. Skull and upper cervical spine: Nonspecific low upper cervical marrow signal without focal lesion. Skull base and calvarial marrow signal is normal. Sinuses/Orbits: Negative IMPRESSION: 1. Small acute infarct in the lateral  left medulla. 2. Chronic small vessel ischemia in the cerebral white matter and pons. Electronically Signed   By: Marnee Spring M.D.   On: 05/30/2019 08:37   US Renal  Result Date: 05/30/2019 CLINICAL DATA:  Renal failure. EXAM: RENAL / URINARY TRACT ULTRASOUND COMPLETE COMPARISON:  None. FINDINGS: Right Kidney: Renal measurements: 13.4 x 6.4 x 7.6 cm = volume: 339 mL . Echogenicity within normal limits. No mass or hydronephrosis visualized. Left Kidney: Renal measurements: 13.3 x 6.2 x 5.9 cm = volume: 256 mL. Echogenicity within normal limits. No mass or hydronephrosis visualized. Bladder: Appears normal for degree of bladder distention. IMPRESSION: Normal renal  ultrasound. Electronically Signed   By: Obie Dredge M.D.   On: 05/30/2019 13:10   Dg Chest Port 1 View  Result Date: 06/22/2019 CLINICAL DATA:  Chronic ventilator dependent respiratory failure with acute cough. EXAM: PORTABLE CHEST 1 VIEW COMPARISON:  06/15/2019 and earlier. FINDINGS: Tracheostomy tube tip in satisfactory position below the thoracic inlet. Cardiac silhouette upper normal in size to mildly enlarged, unchanged. Pulmonary vascularity normal without evidence of pulmonary edema. Improved aeration in the lung bases since the 06/15/2019 examination, with residual mild bibasilar atelectasis, LEFT greater than RIGHT. No new pulmonary parenchymal abnormalities. IMPRESSION: 1. Tracheostomy tube tip in satisfactory position below the thoracic inlet. 2. Improved aeration in the lung bases since the 06/15/19 examination with residual mild bibasilar atelectasis, LEFT greater than RIGHT. 3. No new abnormalities. Electronically Signed   By: Hulan Saas M.D.   On: 06/22/2019 15:54   Dg Chest Port 1 View  Result Date: 06/15/2019 CLINICAL DATA:  63 year old male with a history of dyspnea EXAM: PORTABLE CHEST 1 VIEW COMPARISON:  June 13, 2019 FINDINGS: Cardiomediastinal silhouette unchanged in size and contour. Unchanged tracheostomy. Unchanged enteric feeding tube terminating out of the field of view. Low lung volumes with reticular opacities bilaterally. No pneumothorax or pleural effusion. No new confluent airspace disease. IMPRESSION: Low lung volumes, with reticular opacities potentially representing atelectasis or mild edema. Unchanged tracheostomy. Unchanged enteric feeding tube Electronically Signed   By: Gilmer Mor D.O.   On: 06/15/2019 08:47   Dg Chest Port 1 View  Result Date: 06/10/2019 CLINICAL DATA:  Weakness. EXAM: PORTABLE CHEST 1 VIEW COMPARISON:  Chest radiograph 06/09/2019 FINDINGS: Tracheostomy tube mid trachea. Enteric tube courses inferior to the diaphragm. Monitoring leads  overlie the patient. Stable cardiomegaly. Similar-appearing bilateral mid and lower lung patchy areas of consolidation. No pleural effusion or pneumothorax. IMPRESSION: Similar bibasilar opacities favored to represent atelectasis. Electronically Signed   By: Annia Belt M.D.   On: 06/10/2019 09:51   Dg Chest Port 1 View  Result Date: 06/09/2019 CLINICAL DATA:  Post tracheostomy EXAM: PORTABLE CHEST 1 VIEW COMPARISON:  Portable exam 1232 hours compared to 06/04/2019 FINDINGS: Tracheostomy tube present projecting over tracheal air column. Borderline enlargement of cardiac silhouette. Mediastinal contour stable. Bibasilar atelectasis. Upper lungs clear. No pleural effusion or pneumothorax. IMPRESSION: New tracheostomy tube. Bibasilar atelectasis. Electronically Signed   By: Ulyses Southward M.D.   On: 06/09/2019 12:59   Dg Chest Port 1 View  Result Date: 06/04/2019 CLINICAL DATA:  Respiratory failure EXAM: PORTABLE CHEST 1 VIEW COMPARISON:  06/03/2019 FINDINGS: ET tube tip is above the carina. Feeding tube in place. The heart size appears normal. There is no pleural effusion or edema identified. No airspace opacities identified. Mild subsegmental atelectasis within the lung bases. IMPRESSION: 1. Mild bibasilar atelectasis. 2. Stable ET tube. Electronically Signed   By: Veronda Prude.D.  On: 06/04/2019 08:47   Dg Chest Port 1 View  Result Date: 06/03/2019 CLINICAL DATA:  Respiratory failure EXAM: PORTABLE CHEST 1 VIEW COMPARISON:  Earlier today FINDINGS: Endotracheal tube tip just below the clavicular heads. The feeding tube at least reaches the stomach. Low volume chest with streaky infrahilar densities. No Kerley lines, air bronchogram, or pneumothorax. Artifact from EKG leads. IMPRESSION: 1. Unremarkable hardware positioning. 2. Streaky opacities favoring atelectasis. Electronically Signed   By: Marnee Spring M.D.   On: 06/03/2019 08:27   Dg Chest Port 1 View  Result Date: 06/03/2019 CLINICAL DATA:   Acute respiratory distress. EXAM: PORTABLE CHEST 1 VIEW COMPARISON:  Radiograph yesterday. FINDINGS: Weighted enteric tube is present and seen to the mid thorax, tip not well visualized. Low lung volumes. Stable cardiomegaly. Similar atelectasis without confluent airspace disease. No large pleural effusion or pneumothorax. IMPRESSION: Low lung volumes with stable cardiomegaly and scattered atelectasis. Electronically Signed   By: Narda Rutherford M.D.   On: 06/03/2019 03:06   Portable Chest X-ray  Result Date: 06/03/2019 CLINICAL DATA:  63 year old male status post intubation. EXAM: PORTABLE CHEST 1 VIEW COMPARISON:  Chest radiograph dated 06/02/2019 FINDINGS: There has been interval placement of an endotracheal tube with tip approximately 4.5 cm above the carina. The feeding tube extends into the right hemiabdomen with tip beyond the inferior margin of the image. No significant change in the aeration of the lungs. Stable cardiac silhouette. No acute osseous pathology. IMPRESSION: Interval placement of an endotracheal tube with tip above the carina. Electronically Signed   By: Elgie Collard M.D.   On: 06/03/2019 01:57   Dg Chest Port 1 View  Result Date: 06/02/2019 CLINICAL DATA:  Respiratory difficulty EXAM: PORTABLE CHEST 1 VIEW COMPARISON:  06/01/2019 FINDINGS: Cardiac shadow is again enlarged but stable. Feeding catheter has been removed. The lungs are well aerated bilaterally. Minimal right basilar atelectasis is seen. No focal confluent infiltrate is noted. No bony abnormality is seen. IMPRESSION: Minimal right basilar atelectasis. Electronically Signed   By: Alcide Clever M.D.   On: 06/02/2019 07:00   Dg Chest Port 1 View  Result Date: 06/01/2019 CLINICAL DATA:  Difficulty swallowing with laryngitis EXAM: PORTABLE CHEST 1 VIEW COMPARISON:  Radiograph May 30, 2019 FINDINGS: Lung volumes are diminished with increasing hazy opacities in the lung bases favored to reflect increasing areas of  atelectasis. No pneumothorax. No effusion. No acute osseous or soft tissue abnormality. Cardiomediastinal contours are unremarkable. A transesophageal tube tip is distal to the GE junction. IMPRESSION: 1. Low lung volumes with increasing bibasilar atelectasis. 2. Lungs otherwise clear. Electronically Signed   By: Kreg Shropshire M.D.   On: 06/01/2019 15:22   Dg Chest Port 1 View  Result Date: 05/31/2019 CLINICAL DATA:  Acute respiratory failure EXAM: PORTABLE CHEST 1 VIEW COMPARISON:  05/31/2019 FINDINGS: Cardiac shadow is within normal limits. Previously seen atelectasis is again noted in the bases. No new focal abnormality is seen. No bony abnormality is noted. IMPRESSION: Stable basilar atelectasis.  No new focal abnormality is seen. Electronically Signed   By: Alcide Clever M.D.   On: 05/31/2019 11:51   Dg Chest Port 1 View  Result Date: 05/31/2019 CLINICAL DATA:  Cough.  Possible aspiration. EXAM: PORTABLE CHEST 1 VIEW COMPARISON:  Yesterday FINDINGS: Low volume chest with streaky opacity at the bases. Stable cardiomegaly. No effusion or pneumothorax. IMPRESSION: 1. Low volume chest with atelectatic type opacity at the bases. 2. Cardiomegaly. Electronically Signed   By: Kathrynn Ducking.D.  On: 05/31/2019 05:15     The results of significant diagnostics from this hospitalization (including imaging, microbiology, ancillary and laboratory) are listed below for reference.     Microbiology: Recent Results (from the past 240 hour(s))  Culture, respiratory (non-expectorated)     Status: None   Collection Time: 06/24/19  3:10 PM   Specimen: Tracheal Aspirate; Respiratory  Result Value Ref Range Status   Specimen Description TRACHEAL ASPIRATE  Final   Special Requests NONE  Final   Gram Stain   Final    FEW SQUAMOUS EPITHELIAL CELLS PRESENT RARE WBC PRESENT, PREDOMINANTLY PMN MODERATE GRAM NEGATIVE RODS FEW GRAM POSITIVE COCCI RARE GRAM POSITIVE RODS    Culture   Final    ABUNDANT  HAEMOPHILUS INFLUENZAE BETA LACTAMASE NEGATIVE Performed at [facility], [address], [place], [place] [address]    Report Status 06/26/2019 FINAL  Final     Labs: BNP (last 3 results) No results for input(s): BNP in the last 8760 hours. Basic Metabolic Panel: Recent Labs  Lab 06/21/19 0330 06/26/19 0445  NA 137 137  K 4.2 4.2  CL 95* 97*  CO2 32 31  GLUCOSE 222* 168*  BUN 13 14  CREATININE 0.77 0.84  CALCIUM 8.6* 8.7*   Liver Function Tests: No results for input(s): AST, ALT, ALKPHOS, BILITOT, PROT, ALBUMIN in the last 168 hours. No results for input(s): LIPASE, AMYLASE in the last 168 hours. No results for input(s): AMMONIA in the last 168 hours. CBC: Recent Labs  Lab 06/21/19 0330 06/26/19 0445 06/26/19 0746  WBC 8.0 4.5 6.8  NEUTROABS 4.3  --   --   HGB 12.3* 18.2* 13.6  HCT 38.7* 56.0* 41.7  MCV 94.9 92.4 92.5  PLT 349 149* 295   Cardiac Enzymes: No results for input(s): CKTOTAL, CKMB, CKMBINDEX, TROPONINI in the last 168 hours. BNP: Invalid input(s): POCBNP CBG: Recent Labs  Lab 06/26/19 2011 06/26/19 2334 06/27/19 0410 06/27/19 0824 06/27/19 1110  GLUCAP 143* 180* 185* 172* 171*   D-Dimer Recent Labs    06/26/19 1303  DDIMER 2.05*   Hgb A1c No results for input(s): HGBA1C in the last 72 hours. Lipid Profile No results for input(s): CHOL, HDL, LDLCALC, TRIG, CHOLHDL, LDLDIRECT in the last 72 hours. Thyroid function studies No results for input(s): TSH, T4TOTAL, T3FREE, THYROIDAB in the last 72 hours.  Invalid input(s): FREET3 Anemia work up No results for input(s): VITAMINB12, FOLATE, FERRITIN, TIBC, IRON, RETICCTPCT in the last 72 hours. Urinalysis    Component Value Date/Time   COLORURINE YELLOW 05/30/2019 0151   APPEARANCEUR HAZY (A) 05/30/2019 0151   LABSPEC 1.023 05/30/2019 0151   PHURINE 5.0 05/30/2019 0151   GLUCOSEU >=500 (A) 05/30/2019 0151   HGBUR MODERATE (A) 05/30/2019 0151   BILIRUBINUR NEGATIVE 05/30/2019  0151   KETONESUR 5 (A) 05/30/2019 0151   PROTEINUR 30 (A) 05/30/2019 0151   UROBILINOGEN 0.2 01/20/2008 1222   NITRITE NEGATIVE 05/30/2019 0151   LEUKOCYTESUR NEGATIVE 05/30/2019 0151   Sepsis Labs Invalid input(s): PROCALCITONIN,  WBC,  LACTICIDVEN Microbiology Recent Results (from the past 240 hour(s))  Culture, respiratory (non-expectorated)     Status: None   Collection Time: 06/24/19  3:10 PM   Specimen: Tracheal Aspirate; Respiratory  Result Value Ref Range Status   Specimen Description TRACHEAL ASPIRATE  Final   Special Requests NONE  Final   Gram Stain   Final    FEW SQUAMOUS EPITHELIAL CELLS PRESENT RARE WBC PRESENT, PREDOMINANTLY PMN MODERATE GRAM NEGATIVE RODS  FEW GRAM POSITIVE COCCI RARE GRAM POSITIVE RODS    Culture   Final    ABUNDANT HAEMOPHILUS INFLUENZAE BETA LACTAMASE NEGATIVE Performed at [facility], [place] [address] [place], [place] [address]    Report Status 06/26/2019 FINAL  Final     Time coordinating discharge in minutes: 65  SIGNED:   Debbe Odea, MD  [facility] 06/27/2019, 2:49 PM Pager   If 7PM-7AM, please contact night-coverage [email or web address] Password [email or web address]

## 2019-06-27 NOTE — Progress Notes (Signed)
Pt transported off unit to fluoroscopy. Delia Heady RN

## 2019-06-27 NOTE — Progress Notes (Signed)
Physical Medicine and Rehabilitation Admission H&P         Chief Complaint  Patient presents with   Functional deficits due to stroke       Dysphagia       HPI: Michael Bright is a 63 year old RH male with history of  T2DM, morbid obesity--BMI 42 and reports of lack of  medical care for >10 years who was admitted to [facility] on 05/30/19 with two day history of left facial weakness with numbness, left gaze preference, LLE weakness with numbness and difficulty swallowing saliva.  He was found to have AKI with low grade fever and MRI brain done revealing acute small left medullary CVA.  2D echo showed EF of 60-65% with  He had decline in respiratory status with increase WOB felt to be due aspiration and inability to handle secretions and was transferred to ICU for closer montioring.  Scopolamine patch and robinol prn added to help with secretions but required intubation 8/15 due to fevers from suspected aspiration event.  MSSA PNA treated with cefazolin X 5 days. Trach placed by Dr. Myrla Halsted on 8/21 and transitioned to ATC by 8/23.     He has been NPO and  was maintained on tube feeds for nutritional support. He has had issue with delirium and paranoia as well as vomiting episodes due to TF with recurrent fevers 8/24 and treated with Unasyn--> keflex for tracheobronchitis. He continues to have copious secretions and chest PT.  He did develop respiratory distress with AMS on 9/2 and  IV robinul as well as pulmicort nebs added. CXR negative for PNA but he has had increase in secretions therefore Keflex changed to Unasyn on 09/06 due to concerns of aspiration PNA. Atropin SL used additionally X 2 days to help with secretions.  He has been tolerating PMSV and MBS done today today showing frank aspiration of all thins before, during and after swallow with consistent cough response. Unasyn narrowed to Rocephin today with recommendations to complete additional 5 days treatment. Blood pressure control is improving  with addition of Norvasc. Mentation has improved with addition of Seroquel. PCCM felt that patient with severe OSA and will need CPAP once decannualted. 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  Therapy ongoing and patient limited by LLE instability, tachycardia--HR in 130's with activity, severe dysphagia and some expressive deficit. CIR recommended due to functional decline.      Review of Systems  Constitutional: Negative for chills and fever.  HENT: Negative for hearing loss and tinnitus.   Eyes: Positive for double vision.  Respiratory: Positive for cough and sputum production.   Cardiovascular: Negative for chest pain and palpitations.  Gastrointestinal: Negative for constipation, heartburn and nausea.  Genitourinary: Negative for dysuria and urgency.  Musculoskeletal: Negative for myalgias and neck pain.  Skin: Negative for itching and rash.  Neurological: Positive for dizziness (in early am with acitivty) and sensory change (left lateral leg/foot).  Psychiatric/Behavioral: The patient is not nervous/anxious and does not have insomnia.           Past Medical History:  Diagnosis Date   Diabetes [facility])             Past Surgical History:  Procedure Laterality Date   CHOLECYSTECTOMY         History reviewed. No pertinent family history.      Social History:  Married. Wife works for [employer].  He retired from  [employer].  He reports that he has never smoked. He has never used smokeless tobacco. He reports current alcohol use. He reports that he does not use drugs.      Allergies: No Known Allergies      No medications prior to admission.     Drug Regimen Review  Drug regimen was reviewed and remains appropriate with no significant issues identified   Home: Home Living Family/patient expects to be discharged to:: Private residence Living Arrangements: Spouse/significant other Available Help at Discharge:  Family, Available 24 hours/day Type of Home: House Home Access: Ramped entrance Home Layout: Two level, Able to live on main level with bedroom/bathroom Alternate Level Stairs-Number of Steps: basement, but does not go in [employer] Shower/Tub: Tub/shower unit [employer]: Handicapped height Home Equipment: Environmental consultant - 2 wheels, Tub bench Additional Comments: handicap accessible home from parents that are now deceased  Lives With: Spouse   Functional History: Prior Function Level of Independence: Independent   Functional Status:  Mobility: Bed Mobility Overal bed mobility: Needs Assistance Bed Mobility: Supine to Sit Supine to sit: Min assist, HOB elevated Sit to supine: HOB elevated, Min guard General bed mobility comments: Min A for pulling with LUE at therpist's hand. Cues for scooting towards EOB Transfers Overall transfer level: Needs assistance Equipment used: Rolling walker (2 wheeled) Transfer via Lift Equipment: Stedy Transfers: Sit to/from Stand Sit to Stand: Min assist, +2 safety/equipment Stand pivot transfers: Mod assist, +2 safety/equipment General transfer comment: Min A +2 for gaining balance in standing. No needed assistance for power up. Cues for hand placement Ambulation/Gait Ambulation/Gait assistance: +2 physical assistance, Mod assist Gait Distance (Feet): 16 Feet(6', 10') Assistive device: Rolling walker (2 wheeled) Gait Pattern/deviations: Decreased stance time - left, Decreased weight shift to left, Decreased step length - right, Decreased step length - left, Step-through pattern, Ataxic General Gait Details: pt's L knee intermittently hyperextending and buckling, compensating with increased wt through LUE  Gait velocity: decr Gait velocity interpretation: <1.31 ft/sec, indicative of household ambulator     ADL: ADL Overall ADL's : Needs assistance/impaired Eating/Feeding: NPO Eating/Feeding Details (indicate cue type and reason):  [employer] Grooming: Wash/dry face, Moderate assistance, Standing Grooming Details (indicate cue type and reason): Pt requiring Mod A +2 for standing balance at sink to wash his face. Pt requiring left knee to be blocked due to weakness. Able to perform bilateral coorindation of UE for several seconds before returning hand to sink for support Upper Body Bathing: Set up, Sitting Lower Body Bathing: Moderate assistance, Sit to/from stand, Sitting/lateral leans Upper Body Dressing : Set up, Sitting Lower Body Dressing: Moderate assistance, Sit to/from stand, Sitting/lateral leans Toilet Transfer: +2 for physical assistance, Minimal assistance, Ambulation, RW(Simulated to recliner) Toilet Transfer Details (indicate cue type and reason): Min A +2 for gaining stanidng balance. Requiring Mod A +2 for mobility Toileting- Clothing Manipulation and Hygiene: Minimal assistance, +2 for physical assistance Toileting - Clothing Manipulation Details (indicate cue type and reason): sit<>stand and with use of Denna Haggard Tub/ Shower Transfer: Moderate assistance, Shower seat, Rolling walker Functional mobility during ADLs: Moderate assistance, +2 for physical assistance, Rolling walker General ADL Comments: Pt demonstrating incrreased activity tolerance to mobility to sink with Mod A +2 and thne perform grooming task before taking seated rest break   Cognition: Cognition Overall Cognitive Status: Impaired/Different from baseline Arousal/Alertness: Awake/alert Orientation Level: Oriented X4 Attention: Focused, Sustained Focused Attention: Appears intact Sustained Attention: Appears intact Memory: Impaired Memory Impairment: Storage deficit, Retrieval deficit(Immediate: 3/3; delayed:  2/3 with cues: 1/1) Awareness: Appears intact Problem Solving: Appears intact(3/3) Executive Function: Reasoning Reasoning: Appears intact Cognition Arousal/Alertness: Awake/alert Behavior During Therapy: WFL for tasks  assessed/performed Overall Cognitive Status: Impaired/Different from baseline Area of Impairment: Safety/judgement Safety/Judgement: Decreased awareness of safety, Decreased awareness of deficits General Comments: Pt following simple cues and demonstrating increased awareness for assistance needed with deficits. Applying education provided such as hand placement with transfers - require cues for first transfer and then applying techniques for second transfer Difficult to assess due to: Tracheostomy     Blood pressure 123/90, pulse (!) 103, temperature 98.4 F (36.9 C), temperature source Oral, resp. rate 20, height 6' (1.829 m), weight (!) 141.7 kg, SpO2 98 %. Physical Exam  Nursing note and vitals reviewed. Constitutional: He is oriented to person, place, and time. He appears well-developed and well-nourished. No distress.  Neck:  CFS #6 trach in place with thick white secretions--barium?. Able to phone with PMSV in place.   Cardiovascular: Normal rate and regular rhythm.  HR up to 120 with MMT  Musculoskeletal:        General: No tenderness or edema.  Neurological: He is alert and oriented to person, place, and time.  Speech mildly dysphonic. Able to answer most orientation questions without difficulty. Able to follow simple motor commands.   Skin: He is not diaphoretic.    General: No acute distress Mood and affect are appropriate Heart: Regular rate and rhythm no rubs murmurs or extra sounds Lungs:no wheezes or stridor, diminished BS at bases Abdomen: Positive bowel sounds, soft nontender to palpation, nondistended Extremities: No clubbing, cyanosis, or edema Skin: No evidence of breakdown, no evidence of rash Neurologic: Cranial nerves II through XII intact, motor strength is 4+/5 in bilateral deltoid, bicep, tricep, grip, hip flexor, knee extensors, ankle dorsiflexor and plantar flexor Sensory exam normal sensation to light touch  in bilateral upper and lower  extremities Cerebellar exam normal finger to nose to finger mild dysmetria on Left Musculoskeletal: Full range of motion in all 4 extremities. No joint swelling   Lab Results Last 48 Hours        Results for orders placed or performed during the hospital encounter of 05/30/19 (from the past 48 hour(s))  Glucose, capillary     Status: Abnormal    Collection Time: 06/25/19  4:30 PM  Result Value Ref Range    Glucose-Capillary 174 (H) 70 - 99 mg/dL  Glucose, capillary     Status: Abnormal    Collection Time: 06/25/19  8:22 PM  Result Value Ref Range    Glucose-Capillary 168 (H) 70 - 99 mg/dL  Glucose, capillary     Status: Abnormal    Collection Time: 06/26/19 12:23 AM  Result Value Ref Range    Glucose-Capillary 212 (H) 70 - 99 mg/dL  Glucose, capillary     Status: Abnormal    Collection Time: 06/26/19  3:20 AM  Result Value Ref Range    Glucose-Capillary 160 (H) 70 - 99 mg/dL  CBC     Status: Abnormal    Collection Time: 06/26/19  4:45 AM  Result Value Ref Range    WBC 4.5 4.0 - 10.5 K/uL    RBC 6.06 (H) 4.22 - 5.81 MIL/uL    Hemoglobin 18.2 (H) 13.0 - 17.0 g/dL    HCT 87.5 (H) 64.3 - 52.0 %    MCV 92.4 80.0 - 100.0 fL    MCH 30.0 26.0 - 34.0 pg    MCHC 32.5 30.0 - 36.0  g/dL    RDW 16.111.8 09.611.5 - 04.515.5 %    Platelets 149 (L) 150 - 400 K/uL    nRBC 0.0 0.0 - 0.2 %      Comment: Performed at [facility], [address] [place], [place] [address]  Basic metabolic panel     Status: Abnormal    Collection Time: 06/26/19  4:45 AM  Result Value Ref Range    Sodium 137 135 - 145 mmol/L    Potassium 4.2 3.5 - 5.1 mmol/L    Chloride 97 (L) 98 - 111 mmol/L    CO2 31 22 - 32 mmol/L    Glucose, Bld 168 (H) 70 - 99 mg/dL    BUN 14 8 - 23 mg/dL    Creatinine, Ser 1.910.84 0.61 - 1.24 mg/dL    Calcium 8.7 (L) 8.9 - 10.3 mg/dL    GFR calc non Af Amer >60 >60 mL/min    GFR calc Af Amer >60 >60 mL/min    Anion gap 9 5 - 15      Comment: Performed at [facility], [address] [place], [place] [address]  CBC     Status: None    Collection Time: 06/26/19  7:46 AM  Result Value Ref Range    WBC 6.8 4.0 - 10.5 K/uL    RBC 4.51 4.22 - 5.81 MIL/uL    Hemoglobin 13.6 13.0 - 17.0 g/dL      Comment: REPEATED TO VERIFY    HCT 41.7 39.0 - 52.0 %    MCV 92.5 80.0 - 100.0 fL    MCH 30.2 26.0 - 34.0 pg    MCHC 32.6 30.0 - 36.0 g/dL    RDW 56.211.8 13.011.5 - 86.515.5 %    Platelets 295 150 - 400 K/uL      Comment: REPEATED TO VERIFY    nRBC 0.0 0.0 - 0.2 %      Comment: Performed at [facility], [address] [place], [place] [address]  Glucose, capillary     Status: Abnormal    Collection Time: 06/26/19  8:19 AM  Result Value Ref Range    Glucose-Capillary 196 (H) 70 - 99 mg/dL  Glucose, capillary     Status: Abnormal    Collection Time: 06/26/19 11:45 AM  Result Value Ref Range    Glucose-Capillary 204 (H) 70 - 99 mg/dL    Comment 1 Notify RN    D-dimer, quantitative (not at [facility])     Status: Abnormal    Collection Time: 06/26/19  1:03 PM  Result Value Ref Range    D-Dimer, Quant 2.05 (H) 0.00 - 0.50 ug/mL-FEU      Comment: (NOTE) At the manufacturer cut-off of 0.50 ug/mL FEU, this assay has been documented to exclude PE with a sensitivity and negative predictive value of 97 to 99%.  At this time, this assay has not been approved by the FDA to exclude DVT/VTE. Results should be correlated with clinical presentation. Performed at [facility], [address] [place], [place] [address]    Glucose, capillary     Status: Abnormal    Collection Time: 06/26/19  4:20 PM  Result Value Ref Range    Glucose-Capillary 153 (H) 70 - 99 mg/dL  Glucose, capillary     Status: Abnormal    Collection Time: 06/26/19  8:11 PM  Result Value Ref Range    Glucose-Capillary 143 (H) 70 - 99 mg/dL  Comment 1 Notify RN      Comment 2 Document in Chart    Glucose, capillary     Status: Abnormal    Collection Time: 06/26/19 11:34 PM  Result Value Ref Range     Glucose-Capillary 180 (H) 70 - 99 mg/dL    Comment 1 Notify RN      Comment 2 Document in Chart    Glucose, capillary     Status: Abnormal    Collection Time: 06/27/19  4:10 AM  Result Value Ref Range    Glucose-Capillary 185 (H) 70 - 99 mg/dL    Comment 1 Notify RN      Comment 2 Document in Chart    Glucose, capillary     Status: Abnormal    Collection Time: 06/27/19  8:24 AM  Result Value Ref Range    Glucose-Capillary 172 (H) 70 - 99 mg/dL  Glucose, capillary     Status: Abnormal    Collection Time: 06/27/19 11:10 AM  Result Value Ref Range    Glucose-Capillary 171 (H) 70 - 99 mg/dL      Imaging Results (Last 48 hours)  Ct Angio Chest Pe W Or Wo Contrast   Result Date: 06/26/2019 CLINICAL DATA:  High probability for pulmonary embolus. Acute respiratory failure. EXAM: CT ANGIOGRAPHY CHEST WITH CONTRAST TECHNIQUE: Multidetector CT imaging of the chest was performed using the standard protocol during bolus administration of intravenous contrast. Multiplanar CT image reconstructions and MIPs were obtained to evaluate the vascular anatomy. CONTRAST:  80mL OMNIPAQUE IOHEXOL 350 MG/ML SOLN COMPARISON:  None FINDINGS: Cardiovascular: No acute abnormality. The main pulmonary artery is patent. No saddle embolus or central obstructing pulmonary embolus identified. No lobar or segmental pulmonary artery filling defects identified. Normal heart size. No pericardial effusion. Mild aortic atherosclerosis. Mediastinum/Nodes: Tracheostomy tube tip is above the carina. There is a feeding tube in place. No enlarged mediastinal or hilar adenopathy. Lungs/Pleura: No pleural effusion. Bilateral lower lobe atelectasis, consolidation and volume loss identified. Lingular atelectasis is noted. Upper Abdomen: No acute abnormality. Musculoskeletal: Spondylosis identified. Review of the MIP images confirms the above findings. IMPRESSION: 1. No evidence for acute pulmonary embolus. 2. Bilateral lower lobe atelectasis,  airspace consolidation and volume loss. Lingular atelectasis also noted. Electronically Signed   By: Signa Kell M.D.   On: 06/26/2019 15:37            Medical Problem List and Plan: 1.  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  Antithrombotics: -DVT/anticoagulation:  Pharmaceutical: Lovenox             -antiplatelet therapy: ASA/Plavix 3. Pain Management: n/A 4. Mood: LCSW to follow for evaluation and support.              -antipsychotic agents: Continue seroquel 5. Neuropsych: This patient is not fully capable of making decisions on his own behalf. 6. Skin/Wound Care: Routine pressure relief measures.  7. Fluids/Electrolytes/Nutrition: NPO with tube feeds  8. T2DM: Hgb A1c-10.6. Now on lantus with SSI for elevated BS. Continue to monitor BS every 4 hours.  9. Recurrent PNA--H flu on 9/7: Has been on prolonged antibiotics--narrowed to rocephin on 9/8.  10. Respiratory failure s/p trach: Continue 28 % ATC. Scopolamine patch/ robinul to help manage secretions.  11. Tachycardia: Continues to have resting HR in 110's. Atropine may have contributed to this. Also on Scopolamine and glycopyrrolate 12. Severe dysphagia: Will likely need PEG--barium noted via trach post MBS. Continue TF for now with  strict aspiration precautions. Will order CXR for follow up.      Michael ColaceAndrew E. Kaelea Bright M.D. [facility] FAAPM&R (Sports Med, Neuromuscular Med) Diplomate Am Board of Electrodiagnostic Med    Michael Creeamela S Love, PA-C 06/27/2019

## 2019-06-27 NOTE — Progress Notes (Signed)
Inpatient Rehabilitation-Admissions Coordinator   I have received insurance approval and medical clearance from Dr. Wynelle Cleveland for admit to CIR today. Pt and his wife would like to proceed with CIR at this time. AC will update RN, CM/SW regarding plan. Please call if questions.   Jhonnie Garner, OTR/L  Rehab Admissions Coordinator  [phone] 06/27/2019 2:34 PM

## 2019-06-27 NOTE — TOC Transition Note (Signed)
Transition of Care [facility]) - CM/SW Discharge Note   Patient Details  Name: Michael Bright MRN: [id number] Date of Birth: Aug 01, 1956  [facility]) CM/SW Contact:  Pollie Friar, RN Phone Number: 06/27/2019, 3:43 PM   Clinical Narrative:    Pt is discharging to CIR today. CM signing off.    Final next level of care: IP Rehab Facility Barriers to Discharge: No Barriers Identified   Patient Goals and CMS Choice        Discharge Placement                       Discharge Plan and Services                                     Social Determinants of Health (SDOH) Interventions     Readmission Risk Interventions No flowsheet data found.

## 2019-06-27 NOTE — Progress Notes (Signed)
Modified Barium Swallow Progress Note  Patient Details  Name: Michael Bright MRN: [id number] Date of Birth: 05/09/1956  Today's Date: 06/27/2019  Modified Barium Swallow completed.  Full report located under Chart Review in the Imaging Section.  Brief recommendations include the following:  Clinical Impression  Pt presents with a severe pharyngeal dysphagia.  There was a malfunction in the fluoroscopy, preventing recording and proper review of the study after completion.  Pt used PMV during assessment.  Thin liquids were administered by teaspoon and straw.  There was consistent and frank aspiration of all trials of thin before, during, and after the swallow. There appeared to be reduced UES relaxation, and potentially reduced traction on UES to assist with its opening, leading to accumulation of thin liquids above the UES and subsequent spilling over the arytenoids and into the airway.  Aspiration consistently elicited a cough response; PMV was removed and aspirated barium was noted to be ejected from trach hub.   Study was discontinued at this time.  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  He will need intensive swallowing remediation.  It is unlikely that a PO diet can be resumed in the near future; pt/family should consider longer-term enteral nutrition. Spoke with pt after study and reviewed results and recommendations.  He verbalized understanding and was pragmatic about expectations and plan.  Pt is D/Cing to CIR today.     Swallow Evaluation Recommendations       SLP Diet Recommendations: NPO;Alternative means - long-term       Medication Administration: Via alternative means                      Goldia Ligman L. Tivis Ringer, [place] Office number [phone] Pager [phone]   Juan Quam Laurice 06/27/2019,3:08 PM

## 2019-06-27 NOTE — Progress Notes (Signed)
Charlett Blake, MD  Physician  Physical Medicine and Rehabilitation  PMR Pre-admission  Signed  Date of Service:  06/01/2019 2:47 PM      Related encounter: ED to Hosp-Admission (Discharged) from 05/30/2019 in [facility]      Signed         PMR Admission Coordinator Pre-Admission Assessment  Patient: Michael Bright is an 63 y.o., male MRN: [id number] DOB: Apr 15, 1956 Height: 6' (182.9 cm) Weight: (!) 141.7 kg  Insurance Information HMO:     PPO: yes     PCP:      IPA:      80/20:      OTHER:  PRIMARY: [employer] [place] with [employer]      Policy#: [id number]      Subscriber: Patient CM Name: Rochele Pages      Phone#: [phone]     Fax#: [phone] Pre-Cert#: [id number]      Employer:  Josem Kaufmann provided by Rochele Pages for CIR with approval dates 9/8-9/15 (up to 9/15 but not including 9/15). Clinical updates are due 9/14 to (f): [phone]; if questions (p): [phone] Benefits:  Phone #: [phone] and [email or web address] provider protal confirmation     Name: [email or web address] Eff. Date: 10/19/2018 - still active     Deduct: $200 ($200 met)      Out of Pocket Max: $500 ($500 met)      Life Max:  CIR: 80% coverage, 20% co-insurance; limited by medical necessity via prior auth      SNF: 80% coverage, 20% co-insurance; limited by medical necessity via prior auth  Outpatient:  80% coverage, 20% co-insurance; limited by medical necessity via prior [place]: 80% coverage, 20% co-insurance; limited by medical necessity via prior auth  DME: 80% coverage, 20% co-insurance; limited by medical necessity via prior auth  Providers:  SECONDARY: Medicare Part A and B      Policy#: [id number]      Subscriber: Patient CM Name:       Phone#:      Fax#:  Pre-Cert#:       Employer:  Benefits:  Phone #:      Name: verified eligibility online via [employer] on 06/27/2019 Eff. Date:      Deduct:       Out of Pocket Max:       Life Max:  CIR:       SNF:  Outpatient:      Co-Pay:   Home Health:       Co-Pay:  DME:      Co-Pay:   Medicaid Application Date:       Case Manager:  Disability Application Date:       Case Worker:   The "Data Collection Information Summary" for patients in Inpatient Rehabilitation Facilities with attached "Privacy Act [place] Records" was provided and verbally reviewed with: Patient and Family  Emergency Contact Information         Contact Information    Name Relation Home Work Mobile   Bieri,[name]    [phone]      Current Medical History  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 Pt presented to the [facility] ED on 8/11 with LLE weakness, left facial droop, swallowing problems, left facial numbness and double vision. CT was negative and an  MRI showed L medulla small acute CVA. Pt was showing increased dyspnea with new O2 requirement. Pt was found to have difficulty managing secretions and was tachypneic and tachycardic. Due to elevated risk for requiring intubation pt was transferred to the ICU on 8/12. Pt required Cortrak for nutrition. Pt was deemed stable and transferred off ICU on 8/14. By 8/15 pt required additional O2 needs, and was eventually intubated on 8/15 due to respiratory insufficiency. Pt was treated with Unasyn and Cefazolin. Pt underwent tracheostomy 8/21. Pt had issues with secretion management. Sputum growing Haemophilus influenza for which he is on Unasyn. Due to tachycardia, CT of the chest was obtained to rule out PE which ultimately showed b/l atelectasis and consolidation.  NG tube remains for nutrition. To be followed up with [facility].  Therapy evaluations completed and CIR has been recommended. Pt is to admit to CIR on 06/27/2019.   Complete NIHSS TOTAL: 1  Patient[facility] medical record from [facility] has been reviewed by the rehabilitation admission coordinator and physician.  Past Medical History  History reviewed. No pertinent past medical history.  Family History   family history is not on file.  Prior Rehab/Hospitalizations Has the patient had prior rehab or hospitalizations prior to admission? No  Has the patient had major surgery during 100 days prior to admission? No              Current Medications  Current Facility-Administered Medications:  .  acetaminophen (TYLENOL) tablet 650 mg, 650 mg, Oral, Q4H PRN, 650 mg at 06/17/19 1604 **OR** acetaminophen (TYLENOL) solution 650 mg, 650 mg, Per Tube, Q4H PRN, 650 mg at 06/24/19 1003 **OR** acetaminophen (TYLENOL) suppository 650 mg, 650 mg, Rectal, Q4H PRN, Minor, Grace Bushy, NP .  amLODipine (NORVASC) tablet 10 mg, 10 mg, Per Tube, Daily, Rizwan, Saima, MD, 10 mg at 06/27/19 3810 .  aspirin chewable tablet 81 mg, 81 mg, Per Tube, Daily, Agarwala, Ravi, MD, 81 mg at 06/27/19 0922 .  atorvastatin (LIPITOR) tablet 40 mg, 40 mg, Per Tube, q1800, Kipp Brood, MD, 40 mg at 06/26/19 1700 .  bisacodyl (DULCOLAX) suppository 10 mg, 10 mg, Rectal, Daily PRN, Minor, Grace Bushy, NP, 10 mg at 06/23/19 1055 .  budesonide (PULMICORT) nebulizer solution 0.25 mg, 0.25 mg, Nebulization, BID, Rizwan, Saima, MD, 0.25 mg at 06/27/19 0746 .  cefTRIAXone (ROCEPHIN) 2 g in sodium chloride 0.9 % 100 mL IVPB, 2 g, Intravenous, Q24H, Debbe Odea, MD, Stopped at 06/27/19 1116 .  chlorhexidine gluconate (MEDLINE KIT) (PERIDEX) 0.12 % solution 15 mL, 15 mL, Mouth Rinse, BID, Agarwala, Ravi, MD, 15 mL at 06/27/19 0800 .  Chlorhexidine Gluconate Cloth 2 % PADS 6 each, 6 each, Topical, Daily, Collene Gobble, MD, 6 each at 06/27/19 [phone]  clopidogrel (PLAVIX) tablet 75 mg, 75 mg, Per Tube, Daily, Collene Gobble, MD, 75 mg at 06/27/19 0258 .  docusate (COLACE) 50 MG/5ML liquid 100 mg, 100 mg, Per Tube, BID PRN, Darlina Sicilian A, NP, 100 mg at  06/27/19 0935 .  enoxaparin (LOVENOX) injection 70 mg, 70 mg, Subcutaneous, Daily, Corey Harold, NP, 70 mg at 06/27/19 0921 .  famotidine (PEPCID) 40 MG/5ML suspension 20 mg, 20 mg, Per Tube, BID, Kipp Brood, MD, 20 mg at 06/27/19 0923 .  feeding supplement (OSMOLITE 1.2 CAL) liquid 1,000 mL, 1,000 mL, Per Tube, Continuous, Arrien, Jimmy Picket, MD, Last Rate: 60 mL/hr at 06/27/19 1044, 1,000 mL at 06/27/19 1044 .  feeding supplement (PRO-STAT SUGAR FREE 64)  liquid 30 mL, 30 mL, Per Tube, TID, Arrien, Jimmy Picket, MD, 30 mL at 06/27/19 6073 .  finasteride (PROSCAR) tablet 5 mg, 5 mg, Oral, Daily, Arrien, Jimmy Picket, MD, 5 mg at 06/27/19 7106 .  glycopyrrolate (ROBINUL) tablet 2 mg, 2 mg, Per Tube, BID, Rizwan, Saima, MD, 2 mg at 06/27/19 2694 .  hydrALAZINE (APRESOLINE) injection 10-20 mg, 10-20 mg, Intravenous, Q6H PRN, Rosalin Hawking, MD .  ibuprofen (ADVIL) tablet 400 mg, 400 mg, Per NG tube, Q6H PRN, Arrien, Jimmy Picket, MD, 400 mg at 06/18/19 0850 .  insulin aspart (novoLOG) injection 0-15 Units, 0-15 Units, Subcutaneous, Q4H, Deterding, Guadelupe Sabin, MD, 3 Units at 06/27/19 1154 .  insulin glargine (LANTUS) injection 25 Units, 25 Units, Subcutaneous, Daily, Debbe Odea, MD, 25 Units at 06/27/19 0921 .  insulin starter kit- pen needles (English) 1 kit, 1 kit, Other, Once, Arrien, Jimmy Picket, MD .  MEDLINE mouth rinse, 15 mL, Mouth Rinse, 10 times per day, Agarwala, Einar Grad, MD, 15 mL at 06/27/19 1348 .  ondansetron (ZOFRAN) injection 4 mg, 4 mg, Intravenous, Q6H PRN, Anders Simmonds, MD, 4 mg at 06/14/19 1104 .  QUEtiapine (SEROQUEL) tablet 100 mg, 100 mg, Per Tube, QHS, Julian Hy, DO, 100 mg at 06/26/19 2120 .  QUEtiapine (SEROQUEL) tablet 50 mg, 50 mg, Per Tube, Daily, Julian Hy, DO, 50 mg at 06/27/19 8546 .  scopolamine (TRANSDERM-SCOP) 1 MG/3DAYS 1.5 mg, 1 patch, Transdermal, Q72H, Rizwan, Saima, MD, 1.5 mg at 06/24/19 1711 .  senna-docusate (Senokot-S)  tablet 1 tablet, 1 tablet, Per Tube, QHS PRN, Kipp Brood, MD, 1 tablet at 06/27/19 0922 .  sodium chloride flush (NS) 0.9 % injection 10-40 mL, 10-40 mL, Intracatheter, Q12H, Agarwala, Ravi, MD, 10 mL at 06/27/19 0922 .  sodium chloride flush (NS) 0.9 % injection 10-40 mL, 10-40 mL, Intracatheter, PRN, Kipp Brood, MD  Patients Current Diet:     Diet Order                  Diet NPO time specified  Diet effective midnight               Precautions / Restrictions Precautions Precautions: Fall, Other (comment) Precaution Comments: cortrak, watch BP, trach collar; PMV with therapies(note: could not find PMV in room) Restrictions Weight Bearing Restrictions: No   Has the patient had 2 or more falls or a fall with injury in the past year? No  Prior Activity Level Community (5-7x/wk): very active PTA; drove, ambulated without AD. Independent PTA  Prior Functional Level Self Care: Did the patient need help bathing, dressing, using the toilet or eating? Independent  Indoor Mobility: Did the patient need assistance with walking from room to room (with or without device)? Independent  Stairs: Did the patient need assistance with internal or external stairs (with or without device)? Independent  Functional Cognition: Did the patient need help planning regular tasks such as shopping or remembering to take medications? Independent  Home Assistive Devices / Equipment Home Assistive Devices/Equipment: None Home Equipment: Walker - 2 wheels, Tub bench  Prior Device Use: Indicate devices/aids used by the patient prior to current illness, exacerbation or injury? None of the above  Current Functional Level Cognition  Arousal/Alertness: Awake/alert Overall Cognitive Status: Impaired/Different from baseline Difficult to assess due to: Tracheostomy Orientation Level: Oriented X4 Safety/Judgement: Decreased awareness of safety, Decreased awareness of deficits  General Comments: Pt following simple cues and demonstrating increased awareness for assistance needed with deficits. Applying education provided  such as hand placement with transfers - require cues for first transfer and then applying techniques for second transfer Attention: Focused, Sustained Focused Attention: Appears intact Sustained Attention: Appears intact Memory: Impaired Memory Impairment: Storage deficit, Retrieval deficit(Immediate: 3/3; delayed: 2/3 with cues: 1/1) Awareness: Appears intact Problem Solving: Appears intact(3/3) Executive Function: Reasoning Reasoning: Appears intact    Extremity Assessment (includes Sensation/Coordination)  Upper Extremity Assessment: LUE deficits/detail LUE Deficits / Details: WFL for strength. Decreased FM skills and finger opposition.  LUE Coordination: decreased fine motor  Lower Extremity Assessment: Defer to PT evaluation LLE Deficits / Details: Test 4/5 but functionally weaker and diffculty supporting pt LLE Sensation: decreased light touch(Pt reports hx of numbness lateral leg due to back injury) LLE Coordination: decreased gross motor    ADLs  Overall ADL's : Needs assistance/impaired Eating/Feeding: NPO Eating/Feeding Details (indicate cue type and reason): coretrak Grooming: Wash/dry face, Moderate assistance, Standing Grooming Details (indicate cue type and reason): Pt requiring Mod A +2 for standing balance at sink to wash his face. Pt requiring left knee to be blocked due to weakness. Able to perform bilateral coorindation of UE for several seconds before returning hand to sink for support Upper Body Bathing: Set up, Sitting Lower Body Bathing: Moderate assistance, Sit to/from stand, Sitting/lateral leans Upper Body Dressing : Set up, Sitting Lower Body Dressing: Moderate assistance, Sit to/from stand, Sitting/lateral leans Toilet Transfer: +2 for physical assistance, Minimal assistance, Ambulation, RW(Simulated to  recliner) Toilet Transfer Details (indicate cue type and reason): Min A +2 for gaining stanidng balance. Requiring Mod A +2 for mobility Toileting- Clothing Manipulation and Hygiene: Minimal assistance, +2 for physical assistance Toileting - Clothing Manipulation Details (indicate cue type and reason): sit<>stand and with use of Denna Haggard Tub/ Shower Transfer: Moderate assistance, Shower seat, Rolling walker Functional mobility during ADLs: Moderate assistance, +2 for physical assistance, Rolling walker General ADL Comments: Pt demonstrating incrreased activity tolerance to mobility to sink with Mod A +2 and thne perform grooming task before taking seated rest break    Mobility  Overal bed mobility: Needs Assistance Bed Mobility: Supine to Sit Supine to sit: Min assist, HOB elevated Sit to supine: HOB elevated, Min guard General bed mobility comments: Min A for pulling with LUE at therpist's hand. Cues for scooting towards EOB    Transfers  Overall transfer level: Needs assistance Equipment used: Rolling walker (2 wheeled) Transfer via Lift Equipment: Stedy Transfers: Sit to/from Stand Sit to Stand: Min assist, +2 safety/equipment Stand pivot transfers: Mod assist, +2 safety/equipment General transfer comment: Min A +2 for gaining balance in standing. No needed assistance for power up. Cues for hand placement    Ambulation / Gait / Stairs / Wheelchair Mobility  Ambulation/Gait Ambulation/Gait assistance: +2 physical assistance, Mod assist Gait Distance (Feet): 16 Feet(6', 10') Assistive device: Rolling walker (2 wheeled) Gait Pattern/deviations: Decreased stance time - left, Decreased weight shift to left, Decreased step length - right, Decreased step length - left, Step-through pattern, Ataxic General Gait Details: pt's L knee intermittently hyperextending and buckling, compensating with increased wt through LUE  Gait velocity: decr Gait velocity interpretation: <1.31 ft/sec,  indicative of household ambulator    Posture / Balance Dynamic Sitting Balance Sitting balance - Comments: supervision for sitting balance on EOB Balance Overall balance assessment: Needs assistance Sitting-balance support: Feet supported, Single extremity supported Sitting balance-Leahy Scale: Good Sitting balance - Comments: supervision for sitting balance on EOB Postural control: Right lateral lean Standing balance support: No upper extremity supported, During functional  activity, Bilateral upper extremity supported Standing balance-Leahy Scale: Poor Standing balance comment: Continues to require physical assistance to maintain standing balance with weakness at left knee    Special needs/care consideration BiPAP/CPAP: pt has CPAP at home but reports he has not used in awhile. Per critical care note on 8/26, he will require return to CPAP once decannulated.  CPM : no Continuous Drip IV : Ceftriaxone, feeding supplement Dialysis : no        Days : no Life Vest : no Oxygen 5L/min Special Bed : no Trach Size : yes, #6  Wound Vac (area) : no      Location : no Skin : left arm ecchymosis, MASD to abdomen, groin                     Bowel mgmt: last BM: 06/23/2019, continent Bladder mgmt: incontinent, external catheter Diabetic mgmt: yes Behavioral consideration : no Chemo/radiation : no   Previous Home Environment (from acute therapy documentation) Living Arrangements: Spouse/significant other  Lives With: Spouse Available Help at Discharge: Family, Available 24 hours/day Type of Home: House Home Layout: Two level, Able to live on main level with bedroom/bathroom Alternate Level Stairs-Number of Steps: basement, but does not go in Home Access: Ramped entrance Bathroom Shower/Tub: Chiropodist: Handicapped height Home Care Services: No Additional Comments: handicap accessible home from parents that are now deceased  Discharge Living Setting Plans for  Discharge Living Setting: Patient's home, Lives with (comment)(wife) Type of Home at Discharge: House Discharge Home Layout: Two level, Able to live on main level with bedroom/bathroom Alternate Level Stairs-Rails: None(NA basement) Alternate Level Stairs-Number of Steps: (NA, basement; pt does not ever use this access) Discharge Home Access: Ramped entrance Discharge Bathroom Shower/Tub: Tub/shower unit Discharge Bathroom Toilet: Handicapped height Discharge Bathroom Accessibility: Yes How Accessible: Accessible via wheelchair, Accessible via walker Does the patient have any problems obtaining your medications?: Yes (Describe)(per pt, limitations include financial issues)  Social/Family/Support Systems Patient Roles: Spouse Contact Information: wife: Mliss Sax [phone]) Anticipated Caregiver: wife and son (82 yo son in evenings) (daughter lives nearby) Anticipated Ambulance person Information: see above Ability/Limitations of Caregiver: Min G for wife (lifiting restrictions <5 lbs) ; (son able to physically assist as able) Caregiver Availability: Other (Comment)(24/7 initally (FMLA by wife); then intermittant A) Discharge Plan Discussed with Primary Caregiver: Yes Is Caregiver In Agreement with Plan?: Yes Does Caregiver/Family have Issues with Lodging/Transportation while Pt is in Rehab?: No  Goals/Additional Needs Patient/Family Goal for Rehab: PT/OT: Supervision/Min A; SLP: Min A Expected length of stay: 16-20 days Cultural Considerations: NA Dietary Needs: NPO, cortrak currently Equipment Needs: TBD Pt/Family Agrees to Admission and willing to participate: Yes Program Orientation Provided & Reviewed with Pt/Caregiver Including Roles  & Responsibilities: Yes(pt and wife)  Barriers to Discharge: Lack of/limited family support, Insurance for SNF coverage, Nutrition means, New oxygen(new O2 needs; nutrition concerns, wife works (can take [employer] and plans to have son to assist  at home))  Decrease burden of Care through IP rehab admission: NA  Possible need for SNF placement upon discharge: Not anticipated; pt has good social support as wife is able to take FMLA and anticipate pt has good prognosis for further progress through CIR. Pt has accessible home as needed.   Patient Condition: I have reviewed medical records from [facility], spoken with RN, and patient and spouse. I met with patient at the bedside for inpatient rehabilitation assessment.  Patient will benefit from ongoing  PT, OT and SLP, can actively participate in 3 hours of therapy a day 5 days of the week, and can make measurable gains during the admission.  Patient will also benefit from the coordinated team approach during an Inpatient Acute Rehabilitation admission.  The patient will receive intensive therapy as well as Rehabilitation physician, nursing, social worker, and care management interventions.  Due to bladder management, bowel management, safety, disease management, medication administration, pain management and patient education the patient requires 24 hour a day rehabilitation nursing.  The patient is currently Mod A x2 with mobility and basic ADLs.  Discharge setting and therapy post discharge at home with home health is anticipated.  Patient has agreed to participate in the Acute Inpatient Rehabilitation Program and will admit 06/27/2019.  Preadmission Screen Completed By:  Jhonnie Garner, 06/27/2019 2:07 PM ______________________________________________________________________   Discussed status with Dr. Letta Pate on 06/27/2019 at 1:11 PM and received approval for admission today.  Admission Coordinator:  Jhonnie Garner, OT, time 1:11PM/Date 06/27/2019  Assessment/Plan: Diagnosis: Left Wallenberg syndrome 1. Does the need for close, 24 hr/day Medical supervision in concert with the patient's rehab needs make it unreasonable for this patient to be served in a less intensive setting?  Yes 2. Co-Morbidities requiring supervision/potential complications: Dysphagia due to stroke n.p.o., respiratory failure, status post tracheostomy 3. Due to bladder management, bowel management, safety, skin/wound care, disease management, medication administration, pain management and patient education, does the patient require 24 hr/day rehab nursing? Yes 4. Does the patient require coordinated care of a physician, rehab nurse, PT (1-2 hrs/day, 5 days/week), OT (1-2 hrs/day, 5 days/week) and SLP (.5-1 hrs/day, 5 days/week) to address physical and functional deficits in the context of the above medical diagnosis(es)? Yes Addressing deficits in the following areas: balance, endurance, locomotion, strength, transferring, bowel/bladder control, bathing, dressing, feeding, toileting, swallowing and psychosocial support 5. Can the patient actively participate in an intensive therapy program of at least 3 hrs of therapy 5 days a week? Yes 6. The potential for patient to make measurable gains while on inpatient rehab is good 7. Anticipated functional outcomes upon discharge from inpatients are: supervision and min assist PT, supervision and min assist OT, supervision and min assist SLP 8. Estimated rehab length of stay to reach the above functional goals is: 16 to 20 days 9. Anticipated D/C setting: Home 10. Anticipated post D/C treatments: [facility] therapy 11. Overall Rehab/Functional Prognosis: good  MD Signature: Charlett Blake M.D. [place] FAAPM&R (Sports Med, Neuromuscular Med) Diplomate Am Board of Electrodiagnostic Med          Revision History Date/Time User Provider Type Action  06/27/2019 2:51 PM Kirsteins, Luanna Salk, MD Physician Sign  06/27/2019 2:08 PM Jhonnie Garner, OT Rehab Admission Coordinator Share  06/27/2019 1:37 PM Jhonnie Garner, OT Rehab Admission Coordinator Share  06/27/2019 1:00 PM Jhonnie Garner, OT Rehab Admission Coordinator Share  06/27/2019 12:55 PM  Jhonnie Garner, [place] Rehab Admission Coordinator Share  06/16/2019 6:03 PM Jhonnie Garner, [place] Rehab Admission Coordinator Share  06/16/2019 5:58 PM Jhonnie Garner, [place] Rehab Admission Coordinator Share  View Details Report

## 2019-06-28 ENCOUNTER — Inpatient Hospital Stay (HOSPITAL_COMMUNITY): Payer: Medicare Other

## 2019-06-28 ENCOUNTER — Inpatient Hospital Stay (HOSPITAL_COMMUNITY): Payer: BC Managed Care – PPO

## 2019-06-28 ENCOUNTER — Inpatient Hospital Stay (HOSPITAL_COMMUNITY): Payer: Medicare Other | Admitting: Physical Therapy

## 2019-06-28 DIAGNOSIS — R1313 Dysphagia, pharyngeal phase: Secondary | ICD-10-CM

## 2019-06-28 DIAGNOSIS — Y95 Nosocomial condition: Secondary | ICD-10-CM

## 2019-06-28 DIAGNOSIS — J189 Pneumonia, unspecified organism: Secondary | ICD-10-CM

## 2019-06-28 DIAGNOSIS — E669 Obesity, unspecified: Secondary | ICD-10-CM

## 2019-06-28 DIAGNOSIS — Z930 Tracheostomy status: Secondary | ICD-10-CM

## 2019-06-28 DIAGNOSIS — E1169 Type 2 diabetes mellitus with other specified complication: Secondary | ICD-10-CM

## 2019-06-28 LAB — CBC WITH DIFFERENTIAL/PLATELET
Abs Immature Granulocytes: 0.02 10*3/uL (ref 0.00–0.07)
Basophils Absolute: 0 10*3/uL (ref 0.0–0.1)
Basophils Relative: 1 %
Eosinophils Absolute: 0.2 10*3/uL (ref 0.0–0.5)
Eosinophils Relative: 3 %
HCT: 37.3 % — ABNORMAL LOW (ref 39.0–52.0)
Hemoglobin: 12.2 g/dL — ABNORMAL LOW (ref 13.0–17.0)
Immature Granulocytes: 0 %
Lymphocytes Relative: 54 %
Lymphs Abs: 3 10*3/uL (ref 0.7–4.0)
MCH: 30.3 pg (ref 26.0–34.0)
MCHC: 32.7 g/dL (ref 30.0–36.0)
MCV: 92.8 fL (ref 80.0–100.0)
Monocytes Absolute: 0.5 10*3/uL (ref 0.1–1.0)
Monocytes Relative: 9 %
Neutro Abs: 1.9 10*3/uL (ref 1.7–7.7)
Neutrophils Relative %: 33 %
Platelets: 273 10*3/uL (ref 150–400)
RBC: 4.02 MIL/uL — ABNORMAL LOW (ref 4.22–5.81)
RDW: 11.9 % (ref 11.5–15.5)
WBC: 5.6 10*3/uL (ref 4.0–10.5)
nRBC: 0 % (ref 0.0–0.2)

## 2019-06-28 LAB — GLUCOSE, CAPILLARY
Glucose-Capillary: 182 mg/dL — ABNORMAL HIGH (ref 70–99)
Glucose-Capillary: 182 mg/dL — ABNORMAL HIGH (ref 70–99)
Glucose-Capillary: 185 mg/dL — ABNORMAL HIGH (ref 70–99)
Glucose-Capillary: 185 mg/dL — ABNORMAL HIGH (ref 70–99)
Glucose-Capillary: 191 mg/dL — ABNORMAL HIGH (ref 70–99)
Glucose-Capillary: 202 mg/dL — ABNORMAL HIGH (ref 70–99)
Glucose-Capillary: 215 mg/dL — ABNORMAL HIGH (ref 70–99)

## 2019-06-28 LAB — COMPREHENSIVE METABOLIC PANEL
ALT: 36 U/L (ref 0–44)
AST: 27 U/L (ref 15–41)
Albumin: 2.3 g/dL — ABNORMAL LOW (ref 3.5–5.0)
Alkaline Phosphatase: 63 U/L (ref 38–126)
Anion gap: 9 (ref 5–15)
BUN: 15 mg/dL (ref 8–23)
CO2: 27 mmol/L (ref 22–32)
Calcium: 8.3 mg/dL — ABNORMAL LOW (ref 8.9–10.3)
Chloride: 99 mmol/L (ref 98–111)
Creatinine, Ser: 0.88 mg/dL (ref 0.61–1.24)
GFR calc Af Amer: >60 mL/min (ref >60)
GFR calc non Af Amer: >60 mL/min (ref >60)
Glucose, Bld: 206 mg/dL — ABNORMAL HIGH (ref 70–99)
Potassium: 3.9 mmol/L (ref 3.5–5.1)
Sodium: 135 mmol/L (ref 135–145)
Total Bilirubin: 0.5 mg/dL (ref 0.3–1.2)
Total Protein: 6.5 g/dL (ref 6.5–8.1)

## 2019-06-28 MED ORDER — OSMOLITE 1.2 CAL PO LIQD
1000.0000 mL | ORAL | Status: DC
  Administered 2019-06-28 – 2019-07-01 (×3): 1000 mL
  Filled 2019-06-28 (×7): qty 1000

## 2019-06-28 MED ORDER — CLOPIDOGREL BISULFATE 75 MG PO TABS: 75.0000 mg | ORAL_TABLET | Freq: Every day | ORAL | Status: DC

## 2019-06-28 NOTE — Evaluation (Signed)
Occupational Therapy Assessment and Plan  Patient Details  Name: Yoav Okane MRN: [id number] Date of Birth: [birth year]  OT Diagnosis: cognitive deficits, hemiplegia affecting non-dominant side and muscle weakness (generalized) Rehab Potential: Rehab Potential (ACUTE ONLY): Good ELOS: 14-18 days   Today's Date: 06/28/2019 OT Individual Time: 1000-1130 OT Individual Time Calculation (min): 90 min     Problem List:  Patient Active Problem List   Diagnosis Date Noted  . Stroke, small vessel ([place]) 06/27/2019  . Aspiration pneumonia ([place]) 06/14/2019  . Essential hypertension 06/14/2019  . Type 2 diabetes mellitus with hyperlipidemia ([place]) 06/14/2019  . Acute respiratory failure ([place])   . Acute CVA (cerebrovascular accident) ([place]) 05/30/2019  . 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 Dysphagia 05/30/2019  . Renal insufficiency 05/30/2019  . Diplopia   . Weakness     Past Medical History:  Past Medical History:  Diagnosis Date  . Diabetes [facility])    Past Surgical History:  Past Surgical History:  Procedure Laterality Date  . CHOLECYSTECTOMY      Assessment & Plan Clinical Impression: Skylen Spiering is a 66 year [place] malewith history of T2DM, morbid obesity--BMI 36 and reports of lack of medical care for >10 years who was admitted to [facility] on 05/30/19 with two day history of left facial weakness with numbness, left gaze preference, LLE weakness with numbness and difficulty swallowing saliva. He was found to have AKI with low grade fever and MRI brain done revealing acute small left medullary CVA. 2D echo showed EF of 60-65% with He had decline in respiratory status with increase WOB felt to be due aspiration and inability to handle secretions and was transferred to ICU for closer montioring. Scopolamine patch and robinol prn added to help with secretions but required intubation 8/15 due to fevers from suspected aspiration event.  MSSA PNA treated with cefazolin X 5 days. Trach placed by Dr. Erskine Emery on 8/21 and transitioned to ATC by 8/23.   He has been NPO and was maintained on tube feeds for nutritional support. He has had issue with delirium and paranoia as well as vomiting episodes due to TF with recurrent fevers 8/24 and treated with Unasyn-->keflex for tracheobronchitis. He continues to have copious secretions and chest PT. He did develop respiratory distress with AMS on 9/2 and IV robinul as well as pulmicort nebs added. CXR negative for PNA but he has had increase in secretions therefore Keflex changed to Unasyn on 09/06 due to concerns of aspiration PNA. Atropin SL used additionally X 2 days to help with secretions. He has been tolerating PMSV and MBS done today today showing frank aspiration of all thins before, during and after swallow with consistent cough response.Unasyn narrowed to Rocephintodaywith recommendations to completeadditional 5 days treatment.Blood pressure control is improving with addition of Norvasc. Mentation has improved with addition of Seroquel. PCCM felt that patient with severe OSA and will need CPAP once decannualted. 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 Therapy ongoing and patient limited by LLE instability, tachycardia--HR in 130's with activity, severe dysphagia and some expressive deficit.CIR recommended due to functional decline.Patient transferred to CIR on 06/27/2019 .    Patient currently requires mod with basic self-care skills secondary to muscle weakness and muscle joint tightness, decreased cardiorespiratoy endurance, decreased problem solving, decreased safety awareness and decreased memory and decreased sitting balance, decreased standing balance, decreased postural control, hemiplegia and decreased balance  strategies.  Prior to hospitalization, patient could complete ADLs with independent .  Patient  will benefit from skilled intervention to decrease level of assist with basic self-care skills prior to discharge home with care partner.  Anticipate patient will require intermittent supervision and follow up home health.  OT - End of Session Activity Tolerance: Tolerates < 10 min activity with changes in vital signs Endurance Deficit: Yes Endurance Deficit Description: generalized weakness OT Assessment Rehab Potential (ACUTE ONLY): Good OT Barriers to Discharge: [place] OT Patient demonstrates impairments in the following area(s): Balance;Safety;Perception;Cognition;Endurance;Motor;Pain;Vision;Sensory OT Basic ADL's Functional Problem(s): Grooming;Bathing;Dressing;Toileting OT Transfers Functional Problem(s): Toilet;Tub/Shower OT Additional Impairment(s): None OT Plan OT Intensity: Minimum of 1-2 x/day, 45 to 90 minutes OT Frequency: 5 out of 7 days OT Duration/Estimated Length of Stay: 14-18 days OT Treatment/Interventions: Balance/vestibular training;Discharge planning;Pain management;Self Care/advanced ADL retraining;Therapeutic Activities;UE/LE Coordination activities;Visual/perceptual remediation/compensation;Therapeutic Exercise;Skin care/wound managment;Patient/family education;Functional mobility training;Disease mangement/prevention;Cognitive remediation/compensation;Community reintegration;DME/adaptive equipment instruction;Neuromuscular re-education;Psychosocial support;UE/LE Strength taining/ROM;Wheelchair propulsion/positioning OT Self Feeding Anticipated Outcome(s): no goal set OT Basic Self-Care Anticipated Outcome(s): (S) OT Toileting Anticipated Outcome(s): (S) OT Bathroom Transfers Anticipated Outcome(s): (S) OT Recommendation Recommendations for Other Services: Speech consult Patient destination: Home Follow Up Recommendations: Home health OT Equipment Recommended: To be determined   Skilled Therapeutic Intervention Skilled OT evaluation completed. Pt edu on OT POC,  ELOS, and rehab expectations. Increased time required during session for line management. Pt completed bed mobility with mod A. Pt required min A to complete sit > stand from EOB. Pt required rest breaks throughout session for HR to lower (> 120 bpm). SpO2 remained above >90 % throughout session, with PMSV on throughout session. Pt with copious amounts of secretions. ADLs completed as described below. Pt quite impulsive during bathroom transfers requiring mod cueing for safety awareness. Pt left sitting up in the w/c with chair pad alarm set.   OT Evaluation Precautions/Restrictions  Precautions Precautions: Fall;Other (comment) Precaution Comments: cortrak, watch BP, trach collar; PMV with therapies Restrictions Weight Bearing Restrictions: No General Chart Reviewed: Yes Family/Caregiver Present: No Vital Signs Therapy Vitals Pulse Rate: 93 Resp: 16 Oxygen Therapy SpO2: 99 % O2 Device: Tracheostomy Collar O2 Flow Rate (L/min): 5 L/min FiO2 (%): 28 % Pain Pain Assessment Pain Scale: 0-10 Pain Score: 0-No pain Home Living/Prior Functioning Home Living Family/patient expects to be discharged to:: Private residence Living Arrangements: Spouse/significant other Available Help at Discharge: Family, Available 24 hours/day Type of Home: House Home Access: Stairs to enter Technical brewer of Steps: 4 stairs Home Layout: Two level, Able to live on main level with bedroom/bathroom Alternate Level Stairs-Number of Steps: basement, but does not go in [employer] Shower/Tub: Multimedia programmer: Handicapped height  Lives With: Spouse IADL History Homemaking Responsibilities: Yes Meal Prep Responsibility: Secondary Laundry Responsibility: Secondary Cleaning Responsibility: Secondary Bill Paying/Finance Responsibility: Secondary Shopping Responsibility: Secondary Current License: Yes Mode of Transportation: Car Occupation: Retired Prior Function Level of Independence:  Independent with basic ADLs, Independent with gait, Independent with transfers Driving: Yes Vocation: On disability ADL ADL Eating: NPO Grooming: Setup Where Assessed-Grooming: Sitting at sink Upper Body Bathing: Supervision/safety Where Assessed-Upper Body Bathing: Edge of bed Lower Body Bathing: Moderate assistance Where Assessed-Lower Body Bathing: Edge of bed Upper Body Dressing: Minimal assistance Where Assessed-Upper Body Dressing: Edge of bed Lower Body Dressing: Moderate assistance Where Assessed-Lower Body Dressing: Edge of bed Toileting: Moderate assistance Where Assessed-Toileting: Bedside Commode, Toilet Toilet Transfer: Moderate assistance Toilet Transfer Method: Counselling psychologist: Bedside commode, Grab bars Vision Baseline Vision/History: Wears glasses  Wears Glasses: Distance only Patient Visual Report: No change from baseline Vision Assessment?: Vision impaired- to be further tested in functional context Perception  Perception: Impaired Inattention/Neglect: Impaired-to be further tested in functional context(some L inattention observed) Praxis Praxis: Intact Cognition Overall Cognitive Status: Impaired/Different from baseline Arousal/Alertness: Awake/alert Orientation Level: Person;Place;Situation Person: Oriented Place: Oriented Situation: Oriented Year: 2020 Month: September Day of Week: Incorrect(Tuesday) Memory: Impaired Memory Impairment: Storage deficit;Retrieval deficit Immediate Memory Recall: Sock;Blue;Bed Memory Recall Sock: Without Cue Memory Recall Blue: With Cue Memory Recall Bed: Not able to recall Attention: Selective Selective Attention: Appears intact Reasoning: Appears intact Behaviors: Impulsive Safety/Judgment: Impaired Sensation Sensation Light Touch: Impaired by gross assessment(reports some numbness in the L LE) Proprioception: Appears Intact Coordination Gross Motor Movements are Fluid and Coordinated:  No Fine Motor Movements are Fluid and Coordinated: No Coordination and Movement Description: impaired 2/2 weakness and pain in L shoulder with flexion Motor  Motor Motor: Hemiplegia Motor - Skilled Clinical Observations: mild L hemi, generalized weakness Mobility  Bed Mobility Bed Mobility: Supine to Sit Supine to Sit: Moderate Assistance - Patient 50-74% Transfers Sit to Stand: Minimal Assistance - Patient > 75% Stand to Sit: Minimal Assistance - Patient > 75%  Trunk/Postural Assessment  Cervical Assessment Cervical Assessment: Exceptions to WFL(rounded shoulders) Thoracic Assessment Thoracic Assessment: Exceptions to WFL(kyphotic posture) Lumbar Assessment Lumbar Assessment: Exceptions to WFL(posterior pelvic tilt) Postural Control Postural Control: Deficits on evaluation Righting Reactions: delayed Protective Responses: delayed  Balance Balance Balance Assessed: Yes Static Sitting Balance Static Sitting - Balance Support: Feet supported Static Sitting - Level of Assistance: 5: Stand by assistance Dynamic Sitting Balance Dynamic Sitting - Balance Support: During functional activity Dynamic Sitting - Level of Assistance: 4: Min assist Static Standing Balance Static Standing - Balance Support: During functional activity;Bilateral upper extremity supported Static Standing - Level of Assistance: 4: Min assist Dynamic Standing Balance Dynamic Standing - Balance Support: During functional activity;Bilateral upper extremity supported Dynamic Standing - Level of Assistance: 4: Min assist Extremity/Trunk Assessment RUE Assessment RUE Assessment: Within Functional Limits LUE Assessment LUE Assessment: Exceptions to [facility] General Strength Comments: 4+/5 MMT LUE Body System: Neuro Brunstrum levels for arm and hand: Arm;Hand Brunstrum level for arm: Stage V Relative Independence from Synergy Brunstrum level for hand: Stage VI Isolated joint movements     Refer to Care Plan for  Long Term Goals  Recommendations for other services: None    Discharge Criteria: Patient will be discharged from OT if patient refuses treatment 3 consecutive times without medical reason, if treatment goals not met, if there is a change in medical status, if patient makes no progress towards goals or if patient is discharged from hospital.  The above assessment, treatment plan, treatment alternatives and goals were discussed and mutually agreed upon: by patient  Curtis Sites 06/28/2019, 12:50 PM

## 2019-06-28 NOTE — Evaluation (Signed)
Speech Language Pathology Assessment and Plan  Patient Details  Name: Michael Bright MRN: [id number] Date of Birth: [birth year]  SLP Diagnosis: Speech and Language deficits;Dysphagia  Rehab Potential: Good ELOS: 14-18 days    Today's Date: 06/28/2019 SLP Individual Time: 1300-1405 SLP Individual Time Calculation (min): 65 min   Problem List:  Patient Active Problem List   Diagnosis Date Noted  . Stroke, small vessel ([place]) 06/27/2019  . Aspiration pneumonia ([place]) 06/14/2019  . Essential hypertension 06/14/2019  . Type 2 diabetes mellitus with hyperlipidemia ([place]) 06/14/2019  . Acute respiratory failure ([place])   . Acute CVA (cerebrovascular accident) ([place]) 05/30/2019  . 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 Dysphagia 05/30/2019  . Renal insufficiency 05/30/2019  . Diplopia   . Weakness    Past Medical History:  Past Medical History:  Diagnosis Date  . Diabetes [facility])    Past Surgical History:  Past Surgical History:  Procedure Laterality Date  . CHOLECYSTECTOMY      Assessment / Plan / Recommendation Clinical Impression Pt is a 30 you M with history of DM2 and who has not followed up with a doctor in over 10 years. Pt presented to the [facility] ED on 8/11 with LLE weakness, left facial droop, swallowing problems, left facial numbness and double vision. 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 Pt was showing increased dyspnea with new O2 requirement. Pt was found to have difficulty managing secretions and was tachypneic and tachycardic. Due to elevated risk for requiring intubation pt was transferred to the ICU on 8/12. Pt required Cortrak for nutrition. Pt was deemed stable and transferred off ICU on 8/14. By 8/15 pt required additional O2 needs, and was eventually intubated on 8/15 due to respiratory insufficiency. Pt was treated with Unasyn and Cefazolin. Pt underwent tracheostomy 8/21. Pt  had issues with secretion management.Sputum growing Haemophilus influenza for which he is on Unasyn. Due to tachycardia, CT of the chest was obtained to rule out PE which ultimately showed b/l atelectasis and consolidation.NG tube remains for nutrition. To be followed up with MBS. Therapy evaluations completed and CIR has been recommended. Pt is to admit to CIR on9/05/2019.  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 Pt demonstrated reduced sensation on left side of face and slight lingual deviation to right during oral motor examination. Pt demonstrated immediate cough during BSE with x 2 ice chips following oral care, in which required >5 minutes for pt to clear producing oral and tracheal secretions.  MBS on 06/27/19 indicated severe pharyngeal dysphagia leading to sensed aspiration before/during/after swallow.  SLP recommends continuing NPO status, order for PEG tube have been placed and continuing thin via tsp/ice chip trials to preserve swallow function.  Pt presents with 80% speech intelligibility in conversation, stating awareness of wet vocal quality, however required max A verbal cues to clear vocal quality with clear throat and hard swallow. Pt demonstrated tolerance of PMSV up to 40 minute intervals, with x3 short removals to assess for air trapping and vital signs remained WFL. Pt presents with moderate-mild cognitive linguistic impairments, deficits include attention during complex task, short term recall, executive function, and emergent awareness. Formal cognitive linguistic assessment, CLQT indicated severe executive function, moderate memory and mild attention, language and visuospatial deficits. Pt's language in conversation appeared functional, suggesting possible language score impacted by attention and recall deficits, however continued assessment is recommended. Pt would benefit  from skilled ST services in order to maximize  functional independence and reduce burden of care, likely requiring 24 hour supervision and continue ST services.   Skilled Therapeutic Interventions           Skilled ST services focused on cognitive skills. SLP administered  cognitive linguistic assessment, educated pt on results and created plan to address deficits. All questions were answered to satisfaction. Pt was left in room with call bell within reach and chair alarm set. ST recommends to continue skilled ST services.   SLP Assessment  Patient will need skilled [facility] during CIR admission    Recommendations  Patient may use Passy-Muir Speech Valve: During all therapies with supervision PMSV Supervision: Full MD: Please consider changing trach tube to : Cuffless SLP Diet Recommendations: NPO Medication Administration: Via alternative means Oral Care Recommendations: Oral care QID Patient destination: Home Follow up Recommendations: Home Health SLP;24 hour supervision/assistance;Skilled Nursing facility Equipment Recommended: None recommended by SLP    SLP Frequency 3 to 5 out of 7 days   SLP Duration  SLP Intensity  SLP Treatment/Interventions 14-18 days  Minumum of 1-2 x/day, 30 to 90 minutes  Cognitive remediation/compensation;Cueing hierarchy;Dysphagia/aspiration precaution training;Functional tasks;Internal/external aids;Patient/family education    Pain Pain Assessment Pain Scale: 0-10 Pain Score: 0-No pain  Prior Functioning Cognitive/Linguistic Baseline: Within functional limits Type of Home: House  Lives With: Spouse Available Help at Discharge: Family;Available 24 hours/day Education: 12th grade Vocation: On disability  Short Term Goals: Week 1: SLP Short Term Goal 1 (Week 1): Pt will demonstrate tolerance of PMSV in 45 minute intervals with vitals signs remaining WFL. SLP Short Term Goal 2 (Week 1): Pt will preform throat clear and swallow to reduce vocal qaulity wetness with  mod A verbal cues. SLP Short Term Goal 3 (Week 1): Pt will consume ice chip trials to preserve swallow function with moderate s/s aspiration. SLP Short Term Goal 4 (Week 1): Pt will complete semi-complex problem solving tasks with min A verbal cues. SLP Short Term Goal 5 (Week 1): Pt will self-awareness and self-correct of functional errors in semi-complex problem solving tasks with min A verbal cues. SLP Short Term Goal 6 (Week 1): Pt will utilizie memory compensatory strategies to recall new, daily information with min A verbal cues.  Refer to Care Plan for Long Term Goals  Recommendations for other services: Neuropsych  Discharge Criteria: Patient will be discharged from SLP if patient refuses treatment 3 consecutive times without medical reason, if treatment goals not met, if there is a change in medical status, if patient makes no progress towards goals or if patient is discharged from hospital.  The above assessment, treatment plan, treatment alternatives and goals were discussed and mutually agreed upon: by patient    [facility] 06/28/2019, 4:16 PM

## 2019-06-28 NOTE — H&P (Signed)
Physical Medicine and Rehabilitation Admission H&P °  °  °    °Chief Complaint  °Patient presents with  °• Functional deficits due to stroke   °    Dysphagia   °  °  °HPI: Michael Bright is a 63 year old RH male with history of  T2DM, morbid obesity--BMI 42 and reports of lack of  medical care for >10 years who was admitted to [facility] on 05/30/19 with two day history of left facial weakness with numbness, left gaze preference, LLE weakness with numbness and difficulty swallowing saliva.  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  2D echo showed EF of 60-65% with  Michael Bright had decline in respiratory status with increase WOB felt to be due aspiration and inability to handle secretions and was transferred to ICU for closer montioring.  Scopolamine patch and robinol prn added to help with secretions but required intubation 8/15 due to fevers from suspected aspiration event.  MSSA PNA treated with cefazolin X 5 days. Trach placed by Dr. Dan Smith on 8/21 and transitioned to ATC by 8/23.   °  °Michael Bright has been NPO and  was maintained on tube feeds for nutritional support. Michael Bright has had issue with delirium and paranoia as well as vomiting episodes due to TF with recurrent fevers 8/24 and treated with Unasyn--> keflex for tracheobronchitis. Michael Bright continues to have copious secretions and chest PT.  Michael Bright did develop respiratory distress with AMS on 9/2 and  IV robinul as well as pulmicort nebs added. CXR negative for PNA but Michael Bright has had increase in secretions therefore Keflex changed to Unasyn on 09/06 due to concerns of aspiration PNA. Atropin SL used additionally X 2 days to help with secretions.  Michael Bright has been tolerating PMSV and MBS done today today showing frank aspiration of all thins before, during and after swallow with consistent cough response. Unasyn narrowed to Rocephin today with recommendations to complete additional 5 days treatment. Blood pressure control is improving  with addition of Norvasc. Mentation has improved with addition of Seroquel. PCCM felt that patient with severe OSA and will need CPAP once decannualted. 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  Therapy ongoing and patient limited by LLE instability, tachycardia--HR in 130's with activity, severe dysphagia and some expressive deficit. CIR recommended due to functional decline.  °  °  °Review of Systems  °Constitutional: Negative for chills and fever.  °HENT: Negative for hearing loss and tinnitus.   °Eyes: Positive for double vision.  °Respiratory: Positive for cough and sputum production.   °Cardiovascular: Negative for chest pain and palpitations.  °Gastrointestinal: Negative for constipation, heartburn and nausea.  °Genitourinary: Negative for dysuria and urgency.  °Musculoskeletal: Negative for myalgias and neck pain.  °Skin: Negative for itching and rash.  °Neurological: Positive for dizziness (in early am with acitivty) and sensory change (left lateral leg/foot).  °Psychiatric/Behavioral: The patient is not nervous/anxious and does not have insomnia.   °  °  °    °Past Medical History:  °Diagnosis Date  °• Diabetes (HCC)    °  °  °     °Past Surgical History:  °Procedure Laterality Date  °• CHOLECYSTECTOMY      ° °  °History reviewed. No pertinent family history.  °  °  °Social History:  Married. Wife works for [employer].  Michael Bright retired from   [employer].  Michael Bright reports that Michael Bright has never smoked. Michael Bright has never used smokeless tobacco. Michael Bright reports current alcohol use. Michael Bright reports that Michael Bright does not use drugs.  °  °  °Allergies: No Known Allergies  °  °  °No medications prior to admission.  ° °  °Drug Regimen Review ° Drug regimen was reviewed and remains appropriate with no significant issues identified °  °Home: °Home Living °Family/patient expects to be discharged to:: Private residence °Living Arrangements: Spouse/significant other °Available Help at Discharge:  Family, Available 24 hours/day °Type of Home: House °Home Access: Ramped entrance °Home Layout: Two level, Able to live on main level with bedroom/bathroom °Alternate Level Stairs-Number of Steps: basement, but does not go in °Bathroom Shower/Tub: Tub/shower unit °Bathroom Toilet: Handicapped height °Home Equipment: Walker - 2 wheels, Tub bench °Additional Comments: handicap accessible home from parents that are now deceased ° Lives With: Spouse °  °Functional History: °Prior Function °Level of Independence: Independent °  °Functional Status:  °Mobility: °Bed Mobility °Overal bed mobility: Needs Assistance °Bed Mobility: Supine to Sit °Supine to sit: Min assist, HOB elevated °Sit to supine: HOB elevated, Min guard °General bed mobility comments: Min A for pulling with LUE at therpist's hand. Cues for scooting towards EOB °Transfers °Overall transfer level: Needs assistance °Equipment used: Rolling walker (2 wheeled) °Transfer via Lift Equipment: Stedy °Transfers: Sit to/from Stand °Sit to Stand: Min assist, +2 safety/equipment °Stand pivot transfers: Mod assist, +2 safety/equipment °General transfer comment: Min A +2 for gaining balance in standing. No needed assistance for power up. Cues for hand placement °Ambulation/Gait °Ambulation/Gait assistance: +2 physical assistance, Mod assist °Gait Distance (Feet): 16 Feet(6', 10') °Assistive device: Rolling walker (2 wheeled) °Gait Pattern/deviations: Decreased stance time - left, Decreased weight shift to left, Decreased step length - right, Decreased step length - left, Step-through pattern, Ataxic °General Gait Details: pt's L knee intermittently hyperextending and buckling, compensating with increased wt through LUE  °Gait velocity: decr °Gait velocity interpretation: <1.31 ft/sec, indicative of household ambulator °  °  °ADL: °ADL °Overall ADL's : Needs assistance/impaired °Eating/Feeding: NPO °Eating/Feeding Details (indicate cue type and reason):  coretrak °Grooming: Wash/dry face, Moderate assistance, Standing °Grooming Details (indicate cue type and reason): Pt requiring Mod A +2 for standing balance at sink to wash his face. Pt requiring left knee to be blocked due to weakness. Able to perform bilateral coorindation of UE for several seconds before returning hand to sink for support °Upper Body Bathing: Set up, Sitting °Lower Body Bathing: Moderate assistance, Sit to/from stand, Sitting/lateral leans °Upper Body Dressing : Set up, Sitting °Lower Body Dressing: Moderate assistance, Sit to/from stand, Sitting/lateral leans °Toilet Transfer: +2 for physical assistance, Minimal assistance, Ambulation, RW(Simulated to recliner) °Toilet Transfer Details (indicate cue type and reason): Min A +2 for gaining stanidng balance. Requiring Mod A +2 for mobility °Toileting- Clothing Manipulation and Hygiene: Minimal assistance, +2 for physical assistance °Toileting - Clothing Manipulation Details (indicate cue type and reason): sit<>stand and with use of Sara Stedy °Tub/ Shower Transfer: Moderate assistance, Shower seat, Rolling walker °Functional mobility during ADLs: Moderate assistance, +2 for physical assistance, Rolling walker °General ADL Comments: Pt demonstrating incrreased activity tolerance to mobility to sink with Mod A +2 and thne perform grooming task before taking seated rest break °  °Cognition: °Cognition °Overall Cognitive Status: Impaired/Different from baseline °Arousal/Alertness: Awake/alert °Orientation Level: Oriented X4 °Attention: Focused, Sustained °Focused Attention: Appears intact °Sustained Attention: Appears intact °Memory: Impaired °Memory Impairment: Storage deficit, Retrieval deficit(Immediate: 3/3; delayed:   2/3 with cues: 1/1) °Awareness: Appears intact °Problem Solving: Appears intact(3/3) °Executive Function: Reasoning °Reasoning: Appears intact °Cognition °Arousal/Alertness: Awake/alert °Behavior During Therapy: WFL for tasks  assessed/performed °Overall Cognitive Status: Impaired/Different from baseline °Area of Impairment: Safety/judgement °Safety/Judgement: Decreased awareness of safety, Decreased awareness of deficits °General Comments: Pt following simple cues and demonstrating increased awareness for assistance needed with deficits. Applying education provided such as hand placement with transfers - require cues for first transfer and then applying techniques for second transfer °Difficult to assess due to: Tracheostomy °  °  °Blood pressure 123/90, pulse (!) 103, temperature 98.4 °F (36.9 °C), temperature source Oral, resp. rate 20, height 6' (1.829 m), weight (!) 141.7 kg, SpO2 98 %. °Physical Exam  °Nursing note and vitals reviewed. °Constitutional: Michael Bright is oriented to person, place, and time. Michael Bright appears well-developed and well-nourished. No distress.  °Neck:  °CFS #6 trach in place with thick white secretions--barium?. Able to phone with PMSV in place.   °Cardiovascular: Normal rate and regular rhythm.  °HR up to 120 with MMT  °Musculoskeletal:     °   General: No tenderness or edema.  °Neurological: Michael Bright is alert and oriented to person, place, and time.  °Speech mildly dysphonic. Able to answer most orientation questions without difficulty. Able to follow simple motor commands.   °Skin: Michael Bright is not diaphoretic.  °  °General: No acute distress °Mood and affect are appropriate °Heart: Regular rate and rhythm no rubs murmurs or extra sounds °Lungs:no wheezes or stridor, diminished BS at bases °Abdomen: Positive bowel sounds, soft nontender to palpation, nondistended °Extremities: No clubbing, cyanosis, or edema °Skin: No evidence of breakdown, no evidence of rash °Neurologic: Cranial nerves II through XII intact, motor strength is 4+/5 in bilateral deltoid, bicep, tricep, grip, hip flexor, knee extensors, ankle dorsiflexor and plantar flexor °Sensory exam normal sensation to light touch  in bilateral upper and lower  extremities °Cerebellar exam normal finger to nose to finger mild dysmetria on Left Musculoskeletal: Full range of motion in all 4 extremities. No joint swelling °  °Lab Results Last 48 Hours  °      °Results for orders placed or performed during the hospital encounter of 05/30/19 (from the past 48 hour(s))  °Glucose, capillary     Status: Abnormal  °  Collection Time: 06/25/19  4:30 PM  °Result Value Ref Range  °  Glucose-Capillary 174 (H) 70 - 99 mg/dL  °Glucose, capillary     Status: Abnormal  °  Collection Time: 06/25/19  8:22 PM  °Result Value Ref Range  °  Glucose-Capillary 168 (H) 70 - 99 mg/dL  °Glucose, capillary     Status: Abnormal  °  Collection Time: 06/26/19 12:23 AM  °Result Value Ref Range  °  Glucose-Capillary 212 (H) 70 - 99 mg/dL  °Glucose, capillary     Status: Abnormal  °  Collection Time: 06/26/19  3:20 AM  °Result Value Ref Range  °  Glucose-Capillary 160 (H) 70 - 99 mg/dL  °CBC     Status: Abnormal  °  Collection Time: 06/26/19  4:45 AM  °Result Value Ref Range  °  WBC 4.5 4.0 - 10.5 K/uL  °  RBC 6.06 (H) 4.22 - 5.81 MIL/uL  °  Hemoglobin 18.2 (H) 13.0 - 17.0 g/dL  °  HCT 56.0 (H) 39.0 - 52.0 %  °  MCV 92.4 80.0 - 100.0 fL  °  MCH 30.0 26.0 - 34.0 pg  °  MCHC 32.5 30.0 - 36.0   g/dL  °  RDW 11.8 11.5 - 15.5 %  °  Platelets 149 (L) 150 - 400 K/uL  °  nRBC 0.0 0.0 - 0.2 %  °    Comment: Performed at [facility], [address] [place], [place] [address]  °Basic metabolic panel     Status: Abnormal  °  Collection Time: 06/26/19  4:45 AM  °Result Value Ref Range  °  Sodium 137 135 - 145 mmol/L  °  Potassium 4.2 3.5 - 5.1 mmol/L  °  Chloride 97 (L) 98 - 111 mmol/L  °  CO2 31 22 - 32 mmol/L  °  Glucose, Bld 168 (H) 70 - 99 mg/dL  °  BUN 14 8 - 23 mg/dL  °  Creatinine, Ser 0.84 0.61 - 1.24 mg/dL  °  Calcium 8.7 (L) 8.9 - 10.3 mg/dL  °  GFR calc non Af Amer >60 >60 mL/min  °  GFR calc Af Amer >60 >60 mL/min  °  Anion gap 9 5 - 15  °    Comment: Performed at [facility], [address] [place], [place] [address]  °CBC     Status: None  °  Collection Time: 06/26/19  7:46 AM  °Result Value Ref Range  °  WBC 6.8 4.0 - 10.5 K/uL  °  RBC 4.51 4.22 - 5.81 MIL/uL  °  Hemoglobin 13.6 13.0 - 17.0 g/dL  °    Comment: REPEATED TO VERIFY  °  HCT 41.7 39.0 - 52.0 %  °  MCV 92.5 80.0 - 100.0 fL  °  MCH 30.2 26.0 - 34.0 pg  °  MCHC 32.6 30.0 - 36.0 g/dL  °  RDW 11.8 11.5 - 15.5 %  °  Platelets 295 150 - 400 K/uL  °    Comment: REPEATED TO VERIFY  °  nRBC 0.0 0.0 - 0.2 %  °    Comment: Performed at [facility], [address] [place],  [address]  °Glucose, capillary     Status: Abnormal  °  Collection Time: 06/26/19  8:19 AM  °Result Value Ref Range  °  Glucose-Capillary 196 (H) 70 - 99 mg/dL  °Glucose, capillary     Status: Abnormal  °  Collection Time: 06/26/19 11:45 AM  °Result Value Ref Range  °  Glucose-Capillary 204 (H) 70 - 99 mg/dL  °  Comment 1 Notify RN    °D-dimer, quantitative (not at [facility])     Status: Abnormal  °  Collection Time: 06/26/19  1:03 PM  °Result Value Ref Range  °  D-Dimer, Quant 2.05 (H) 0.00 - 0.50 ug/mL-FEU  °    Comment: (NOTE) °At the manufacturer cut-off of 0.50 ug/mL FEU, this assay has been °documented to exclude PE with a sensitivity and negative predictive °value of 97 to 99%.  At this time, this assay has not been approved °by the FDA to exclude DVT/VTE. °Results should be correlated with clinical presentation. °Performed at [facility], [address] [place],  °[address] °   °Glucose, capillary     Status: Abnormal  °  Collection Time: 06/26/19  4:20 PM  °Result Value Ref Range  °  Glucose-Capillary 153 (H) 70 - 99 mg/dL  °Glucose, capillary     Status: Abnormal  °  Collection Time: 06/26/19  8:11 PM  °Result Value Ref Range  °  Glucose-Capillary 143 (H) 70 - 99 mg/dL  °    Comment 1 Notify RN    °  Comment 2 Document in Chart    °Glucose, capillary     Status: Abnormal  °  Collection Time: 06/26/19 11:34 PM  °Result Value Ref Range  °   Glucose-Capillary 180 (H) 70 - 99 mg/dL  °  Comment 1 Notify RN    °  Comment 2 Document in Chart    °Glucose, capillary     Status: Abnormal  °  Collection Time: 06/27/19  4:10 AM  °Result Value Ref Range  °  Glucose-Capillary 185 (H) 70 - 99 mg/dL  °  Comment 1 Notify RN    °  Comment 2 Document in Chart    °Glucose, capillary     Status: Abnormal  °  Collection Time: 06/27/19  8:24 AM  °Result Value Ref Range  °  Glucose-Capillary 172 (H) 70 - 99 mg/dL  °Glucose, capillary     Status: Abnormal  °  Collection Time: 06/27/19 11:10 AM  °Result Value Ref Range  °  Glucose-Capillary 171 (H) 70 - 99 mg/dL  °  ° ° °Imaging Results (Last 48 hours)  °Ct Angio Chest Pe W Or Wo Contrast °  °Result Date: 06/26/2019 °CLINICAL DATA:  High probability for pulmonary embolus. Acute respiratory failure. EXAM: CT ANGIOGRAPHY CHEST WITH CONTRAST TECHNIQUE: Multidetector CT imaging of the chest was performed using the standard protocol during bolus administration of intravenous contrast. Multiplanar CT image reconstructions and MIPs were obtained to evaluate the vascular anatomy. CONTRAST:  80mL OMNIPAQUE IOHEXOL 350 MG/ML SOLN COMPARISON:  None FINDINGS: Cardiovascular: No acute abnormality. The main pulmonary artery is patent. No saddle embolus or central obstructing pulmonary embolus identified. No lobar or segmental pulmonary artery filling defects identified. Normal heart size. No pericardial effusion. Mild aortic atherosclerosis. Mediastinum/Nodes: Tracheostomy tube tip is above the carina. There is a feeding tube in place. No enlarged mediastinal or hilar adenopathy. Lungs/Pleura: No pleural effusion. Bilateral lower lobe atelectasis, consolidation and volume loss identified. Lingular atelectasis is noted. Upper Abdomen: No acute abnormality. Musculoskeletal: Spondylosis identified. Review of the MIP images confirms the above findings. IMPRESSION: 1. No evidence for acute pulmonary embolus. 2. Bilateral lower lobe atelectasis,  airspace consolidation and volume loss. Lingular atelectasis also noted. Electronically Signed   By: Taylor  Stroud M.D.   On: 06/26/2019 15:37   ° °  °  °  °  °Medical Problem List and Plan: °1.  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  Antithrombotics: °-DVT/anticoagulation:  Pharmaceutical: Lovenox °            -antiplatelet therapy: ASA/Plavix °3. Pain Management: n/A °4. Mood: LCSW to follow for evaluation and support.  °            -antipsychotic agents: Continue seroquel °5. Neuropsych: This patient is not fully capable of making decisions on his own behalf. °6. Skin/Wound Care: Routine pressure relief measures.  °7. Fluids/Electrolytes/Nutrition: NPO with tube feeds  °8. T2DM: Hgb A1c-10.6. Now on lantus with SSI for elevated BS. Continue to monitor BS every 4 hours.  °9. Recurrent PNA--H flu on 9/7: Has been on prolonged antibiotics--narrowed to rocephin on 9/8.  °10. Respiratory failure s/p trach: Continue 28 % ATC. Scopolamine patch/ robinul to help manage secretions.  °11. Tachycardia: Continues to have resting HR in 110's. Atropine may have contributed to this. Also on Scopolamine and glycopyrrolate °12. Severe dysphagia: Will likely need PEG--barium noted via trach post MBS. Continue TF for now with   strict aspiration precautions. Will order CXR for follow up.  °  °  °Andrew E. Kirsteins M.D. °[facility] °FAAPM&R (Sports Med, Neuromuscular Med) °Diplomate Am Board of Electrodiagnostic Med ° °  °Pamela S Love, PA-C °06/27/2019  °  °  °  ° ° °

## 2019-06-28 NOTE — Progress Notes (Signed)
Initial Nutrition Assessment  DOCUMENTATION CODES:   Morbid obesity  INTERVENTION:   Tube feeding via Cortrak: - Osmolite 1.2 @ 75 ml/hr to run over 20 hours (TF can be held for up to 4 hours for therapies), total of 1500 ml daily - Pro-stat 30 ml TID  Tube feeding regimen provides 2100 kcal, 128 grams of protein, and 1230 ml of H2O (meets 100% of needs).  NUTRITION DIAGNOSIS:   Inadequate oral intake related to dysphagia as evidenced by NPO status.  GOAL:   Patient will meet greater than or equal to 90% of their needs  MONITOR:   Labs, Weight trends, TF tolerance, I & O's  REASON FOR ASSESSMENT:   Consult Enteral/tube feeding initiation and management  ASSESSMENT:   63 year old male with PMH of T2DM, morbid obesity. Pt was admitted to [facility] on 05/30/19 with two day history of left facial weakness with numbness, left gaze preference, LLE weakness with numbness, and difficulty swallowing saliva. 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 Pt had a decline in respiratory status with increase WOB felt to be due aspiration and inability to handle secretions and was transferred to ICU for closer montioring. Pt required intubation 8/15. MSSA PNA treated with cefazolin. Trach placed on 8/21 and transitioned to ATC by 8/23. Pt has been NPO and was maintained on tube feeds for nutritional support. Pt has been tolerating PMSV and MBS done showing frank aspiration of all thins before, during, and after swallow with consistent cough response. Pt admitted to CIR on 9/08.   Noted plan for PEG in IR on 9/14.  RD consulted for tube feeding initiation and management. RD will adjust tube feeding regimen such that TF can be held for up to 4 hours for therapies. Communicated this with RN.  Spoke with pt at bedside. Pt reports that he just had a BM and feels much better. Pt denies any abdominal pain, bloating, or nausea. Pt appears to be  tolerating TF without issue. Pt with questions regarding PEG tube placement. Pt reassured that he will not require Cortrak once PEG placed.  Current TF: Osmolite 1.2 @ 60 ml/hr, Pro-stat 30 ml TID which provides 2028 kcal, 125 grams protein, and 1167 ml free water.  Medications reviewed and include: Pepcid, SSI, Lantus 25 units daily, IV abx  Labs reviewed. CBG's: 119-215 x 24 hours  NUTRITION - FOCUSED PHYSICAL EXAM:    Most Recent Value  Orbital Region  No depletion  Upper Arm Region  No depletion  Thoracic and Lumbar Region  No depletion  Buccal Region  No depletion  Temple Region  No depletion  Clavicle Bone Region  No depletion  Clavicle and Acromion Bone Region  No depletion  Scapular Bone Region  No depletion  Dorsal Hand  No depletion  Patellar Region  Mild depletion  Anterior Thigh Region  Mild depletion  Posterior Calf Region  Mild depletion  Edema (RD Assessment)  Mild [BLE]  Hair  Reviewed  Eyes  Reviewed  Mouth  Reviewed  Skin  Reviewed  Nails  Reviewed       Diet Order:   Diet Order            Diet NPO time specified  Diet effective midnight              EDUCATION NEEDS:   No education needs have been identified at this time  Skin:  Skin Assessment: Reviewed RN  Assessment  Last BM:  06/23/19 (pt states he just had a BM)  Height:   Ht Readings from Last 1 Encounters:  06/27/19 6\' 1"  (1.854 m)    Weight:   Wt Readings from Last 1 Encounters:  06/28/19 (!) 144.7 kg    Ideal Body Weight:  83.6 kg  BMI:  Body mass index is 42.09 kg/m.  Estimated Nutritional Needs:   Kcal:  2100-2300  Protein:  115-130 grams  Fluid:  2.0 L    Earma ReadingKate Jablonski Nadean Montanaro, MS, RD, LDN Inpatient Clinical Dietitian Pager: [phone] Weekend/After Hours: [phone]

## 2019-06-28 NOTE — Progress Notes (Signed)
Patient information reviewed and entered into [facility] System by Becky Dene Landsberg, PPS coordinator. Information including medical coding, function ability, and quality indicators will be reviewed and updated through discharge.   

## 2019-06-28 NOTE — Evaluation (Signed)
Physical Therapy Assessment and Plan  Patient Details  Name: Michael Bright MRN: [id number] Date of Birth: 11-05-1955  PT Diagnosis: Abnormal posture, Abnormality of gait, Coordination disorder, Difficulty walking, Hemiplegia non-dominant and Muscle weakness Rehab Potential: Good ELOS: 1.5-2 weeks   Today's Date: 06/28/2019 PT Individual Time: 1420-1529 PT Individual Time Calculation (min): 69 min    Problem List:  Patient Active Problem List   Diagnosis Date Noted  . Stroke, small vessel ([place]) 06/27/2019  . Aspiration pneumonia ([place]) 06/14/2019  . Essential hypertension 06/14/2019  . Type 2 diabetes mellitus with hyperlipidemia ([place]) 06/14/2019  . Acute respiratory failure ([place])   . Acute CVA (cerebrovascular accident) ([place]) 05/30/2019  . 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 Dysphagia 05/30/2019  . Renal insufficiency 05/30/2019  . Diplopia   . Weakness     Past Medical History:  Past Medical History:  Diagnosis Date  . Diabetes [facility])    Past Surgical History:  Past Surgical History:  Procedure Laterality Date  . CHOLECYSTECTOMY      Assessment & Plan Clinical Impression: Patient is a 63 y.o. year old male withT2DM, morbid obesity--BMI 79 and reports of lack of medical care for >10 years who was admitted to [facility] on 05/30/19 with two day history of left facial weakness with numbness, left gaze preference, LLE weakness with numbness and difficulty swallowing saliva. He was found to have AKI with low grade fever and MRI brain done revealing acute small left medullary CVA. 2D echo showed EF of 60-65% with He had decline in respiratory status with increase WOB felt to be due aspiration and inability to handle secretions and was transferred to ICU for closer montioring. Scopolamine patch and robinol prn added to help with secretions but required intubation 8/15 due to fevers from suspected aspiration event. MSSA PNA treated  with cefazolin X 5 days. Trach placed by Dr. Erskine Emery on 8/21 and transitioned to ATC by 8/23.   He has been NPO and was maintained on tube feeds for nutritional support. He has had issue with delirium and paranoia as well as vomiting episodes due to TF with recurrent fevers 8/24 and treated with Unasyn-->keflex for tracheobronchitis. He continues to have copious secretions and chest PT. He did develop respiratory distress with AMS on 9/2 and IV robinul as well as pulmicort nebs added. CXR negative for PNA but he has had increase in secretions therefore Keflex changed to Unasyn on 09/06 due to concerns of aspiration PNA. Atropin SL used additionally X 2 days to help with secretions. He has been tolerating PMSV and MBS done today today showing frank aspiration of all thins before, during and after swallow with consistent cough response.Unasyn narrowed to Rocephintodaywith recommendations to completeadditional 5 days treatment.Blood pressure control is improving with addition of Norvasc. Mentation has improved with addition of Seroquel. PCCM felt that patient with severe OSA and will need CPAP once decannualted. 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 Therapy ongoing and patient limited by LLE instability, tachycardia--HR in 130's with activity, severe dysphagia and some expressive deficit.CIR recommended due to functional decline.  Patient transferred to CIR on 06/27/2019 .   Patient currently requires mod with mobility secondary to muscle weakness, decreased cardiorespiratoy endurance, decreased coordination, and decreased standing balance, decreased postural control and decreased balance strategies.  Prior to hospitalization, patient was independent  with mobility and lived with Spouse in a House home.  Home access is 4 stairsRamped entrance.  Patient will benefit from skilled PT intervention to maximize safe functional mobility,  minimize fall risk and decrease caregiver burden for planned discharge home with 24 hour supervision.  Anticipate patient will benefit from follow up [place] at discharge.  PT - End of Session Activity Tolerance: Tolerates 30+ min activity with multiple rests Endurance Deficit: Yes Endurance Deficit Description: 2/2 generalized deconditioning/weakness, pt now O2 dependent PT Assessment Rehab Potential (ACUTE/IP ONLY): Good PT Barriers to Discharge: Trach;Nutrition means PT Patient demonstrates impairments in the following area(s): Balance;Perception;Behavior;Safety;Edema;Sensory;Endurance;Pain;Motor PT Transfers Functional Problem(s): Bed Mobility;Bed to Chair;Furniture;Car PT Locomotion Functional Problem(s): Ambulation;Wheelchair Mobility;Stairs PT Plan PT Intensity: Minimum of 1-2 x/day ,45 to 90 minutes PT Frequency: 5 out of 7 days PT Duration Estimated Length of Stay: 1.5-2 weeks PT Treatment/Interventions: Community reintegration;Ambulation/gait training;DME/adaptive equipment instruction;Neuromuscular re-education;Psychosocial support;Stair training;UE/LE Strength taining/ROM;Wheelchair propulsion/positioning;UE/LE Coordination activities;Therapeutic Activities;Skin care/wound management;Pain management;Functional electrical stimulation;Discharge planning;Balance/vestibular training;Cognitive remediation/compensation;Disease management/prevention;Functional mobility training;Splinting/orthotics;Patient/family education;Therapeutic Exercise;Visual/perceptual remediation/compensation PT Transfers Anticipated Outcome(s): supervision with LRAD PT Locomotion Anticipated Outcome(s): supervision with LRAD PT Recommendation Recommendations for Other Services: Speech consult;Neuropsych consult;Therapeutic Recreation consult Therapeutic Recreation Interventions: Stress management Follow Up Recommendations: Home health PT;24 hour supervision/assistance Patient destination: Home Equipment Recommended:  To be determined  Skilled Therapeutic Intervention Patient received in w/c & agreeable to tx. Pt on 28% FiO2 throughout session (3L/min when connected to portable tank, 5L/min when connected to wall concentrator). Educated pt on ELOS, daily therapy schedule, weekly team meetings, anticipated PT goals, and other CIR information. Pt is able to complete sit<>stand transfers with min assist but does experience 2-3 LOB to R upon transfer with therapist assisting him safely back into w/c & providing ongoing cuing for safe hand placement during transfers. Pt is able to ambulate 15 ft + 7 ft + 7 ft with RW & min assist with L foot tending to invert - will ask PA for aircast to support ankle - pt reports he is aware of ankle doing this. Pt also demonstrates ataxia in LLE. Pt completes car transfer at SUV simulated height with min assist and propels w/c with BUE & supervision x 10 ft. Extra time is required throughout session to manage all lines & tubes. Rest breaks provided PRN throughout session & SpO2 remains >90% with activity, HR increases to 115 bpm but decreases to 105bpm with rest. Provided pt with w/c cushion to prevent skin breakdown when OOB. Provided pt with mirror to allow him to practice suctioning with yaunker as pt with ongoing copious amounts of secretions. Pt left in w/c with chair alarm donned & call bell in reach, yaunker in reach.   PT Evaluation Precautions/Restrictions Precautions Precautions: Fall;Other (comment) Precaution Comments: NG tube, watch BP, HR & O2, PMV with therapies, trach collar Restrictions Weight Bearing Restrictions: No  General Chart Reviewed: Yes Additional Pertinent History: DM, morbid obesity Response to Previous Treatment: Patient with no complaints from previous session. Family/Caregiver Present: No  Pain Pt c/o unrated L shoulder pain when propelling w/c so activity ended to alleviate pain.  Home Living/Prior Functioning Home Living Available Help at  Discharge: Family;Available 24 hours/day Type of Home: House Home Access: Ramped entrance Home Layout: Two level;Able to live on main level with bedroom/bathroom Alternate Level Stairs-Number of Steps: basement, but does not go in [employer] Shower/Tub: Multimedia programmer: Handicapped height  Lives With: Spouse Prior Function Level of Independence: Independent with basic ADLs;Independent with gait;Independent with transfers Driving: Yes Vocation: On disability Leisure: Hobbies-yes (Comment) Comments: keeps 5  grandkids during the day  Vision/Perception  Pt reports he wears glasses at all times at baseline. Pt reports impaired depth perception following medical event.  Perception Perception: Impaired Inattention/Neglect: Impaired-to be further tested in functional context Praxis Praxis: Intact   Cognition Overall Cognitive Status: Impaired/Different from baseline Arousal/Alertness: Awake/alert Attention: Selective Selective Attention: Appears intact Memory: Impaired Memory Impairment: decreased recall of new info Reasoning: Appears intact Behaviors: Impulsive Safety/Judgment: Impaired  Sensation Sensation Light Touch: Appears Intact(LLE) Proprioception: Appears Intact(LLE) Coordination Gross Motor Movements are Fluid and Coordinated: No Fine Motor Movements are Fluid and Coordinated: No Heel Shin Test: slightly more impaired LLE  Motor  Motor Motor: Abnormal postural alignment and control Motor - Skilled Clinical Observations: L hemi, LLE ataxia, generalized deconditioning   Mobility Bed Mobility (per OT report) Bed Mobility: Supine to Sit Supine to Sit: Moderate Assistance - Patient 50-74% Transfers Transfers: Sit to Stand;Stand to Sit Sit to Stand: Minimal Assistance - Patient > 75% Stand to Sit: Minimal Assistance - Patient > 75% Stand Pivot Transfers: Moderate Assistance - Patient 50 - 74% Stand Pivot Transfer Details: Verbal cues for  technique;Verbal cues for precautions/safety Transfer (Assistive device): Rolling walker  Locomotion  Gait Ambulation: Yes Gait Assistance: Minimal Assistance - Patient > 75% Gait Distance (Feet): 15 Feet Assistive device: Rolling walker Gait Gait: Yes Gait Pattern: Decreased stride length;Decreased step length - left;Decreased step length - right;Decreased stance time - left Gait velocity: decreased Stairs / Additional Locomotion Stairs: No Wheelchair Mobility Wheelchair Mobility: Yes Wheelchair Assistance: Chartered loss adjuster: Both upper extremities Wheelchair Parts Management: Needs assistance Distance: 10 ft   Trunk/Postural Assessment  Cervical Assessment Cervical Assessment: Exceptions to WFL(rounded shoulders) Thoracic Assessment Thoracic Assessment: Exceptions to WFL(kyphosis) Lumbar Assessment Lumbar Assessment: Exceptions to WFL(posterior pelvic tilt) Postural Control Postural Control: Deficits on evaluation Righting Reactions: delayed Protective Responses: delayed   Balance Balance Balance Assessed: Yes Dynamic Standing Balance Dynamic Standing - Balance Support: During functional activity;Bilateral upper extremity supported Dynamic Standing - Level of Assistance: 4: Min assist Dynamic Standing - Comments: gait with RW  Extremity Assessment  Per OT assessment: RUE Assessment RUE Assessment: Within Functional Limits LUE Assessment LUE Assessment: Exceptions to [facility] General Strength Comments: 4+/5 MMT LUE Body System: Neuro Brunstrum levels for arm and hand: Arm;Hand Brunstrum level for arm: Stage V Relative Independence from Synergy Brunstrum level for hand: Stage VI Isolated joint movements  BLE not formally tested, strength grossly 3+/5 as pt able to weight bear without any buckling noted.    Refer to Care Plan for Long Term Goals  Recommendations for other services: Neuropsych and Therapeutic Recreation  Stress  management  Discharge Criteria: Patient will be discharged from PT if patient refuses treatment 3 consecutive times without medical reason, if treatment goals not met, if there is a change in medical status, if patient makes no progress towards goals or if patient is discharged from hospital.  The above assessment, treatment plan, treatment alternatives and goals were discussed and mutually agreed upon: by patient  Waunita Schooner 06/28/2019, 3:49 PM

## 2019-06-28 NOTE — Progress Notes (Addendum)
Patient ID: Michael Bright, male   DOB: [birth year], 63 y.o.   MRN: [id number]   Request made for percutaneous gastric tube placement Dysphagia Secondary CVA  Anatomy approved by CT imaging  Got one dose Plavix yesterday Must be off 5 days per Dr Vernard Gambles  Plan for G tube in IR 9/14  texted Dr Tora Perches [facility] also aware

## 2019-06-28 NOTE — Progress Notes (Signed)
[facility] PHYSICAL MEDICINE & REHABILITATION PROGRESS NOTE   Subjective/Complaints: Says he had a fair night. Occasional coughing. Pain controlled  ROS: Patient denies fever, rash, sore throat, blurred vision, nausea, vomiting, diarrhea, cough, shortness of breath or chest pain, joint or back pain, headache, or mood change.    Objective:   Dg Chest 2 View  Result Date: 06/27/2019 CLINICAL DATA:  Aspiration EXAM: CHEST - 2 VIEW COMPARISON:  06/22/2019, CT 06/26/2019 FINDINGS: Tracheostomy tube is in place. Streaky basilar airspace opacities without significant change. Stable cardiomediastinal silhouette. No pneumothorax. IMPRESSION: 1. Similar appearance of patchy basilar airspace opacities. No new abnormality is evident. 2. Borderline cardiomegaly Electronically Signed   By: Donavan Foil M.D.   On: 06/27/2019 21:58   Ct Angio Chest Pe W Or Wo Contrast  Result Date: 06/26/2019 CLINICAL DATA:  High probability for pulmonary embolus. Acute respiratory failure. EXAM: CT ANGIOGRAPHY CHEST WITH CONTRAST TECHNIQUE: Multidetector CT imaging of the chest was performed using the standard protocol during bolus administration of intravenous contrast. Multiplanar CT image reconstructions and MIPs were obtained to evaluate the vascular anatomy. CONTRAST:  60mL OMNIPAQUE IOHEXOL 350 MG/ML SOLN COMPARISON:  None FINDINGS: Cardiovascular: No acute abnormality. The main pulmonary artery is patent. No saddle embolus or central obstructing pulmonary embolus identified. No lobar or segmental pulmonary artery filling defects identified. Normal heart size. No pericardial effusion. Mild aortic atherosclerosis. Mediastinum/Nodes: Tracheostomy tube tip is above the carina. There is a feeding tube in place. No enlarged mediastinal or hilar adenopathy. Lungs/Pleura: No pleural effusion. Bilateral lower lobe atelectasis, consolidation and volume loss identified. Lingular atelectasis is noted. Upper Abdomen: No acute  abnormality. Musculoskeletal: Spondylosis identified. Review of the MIP images confirms the above findings. IMPRESSION: 1. No evidence for acute pulmonary embolus. 2. Bilateral lower lobe atelectasis, airspace consolidation and volume loss. Lingular atelectasis also noted. Electronically Signed   By: Kerby Moors M.D.   On: 06/26/2019 15:37   Recent Labs    06/26/19 0746 06/28/19 0500  WBC 6.8 5.6  HGB 13.6 12.2*  HCT 41.7 37.3*  PLT 295 273   Recent Labs    06/26/19 0445 06/28/19 0500  NA 137 135  K 4.2 3.9  CL 97* 99  CO2 31 27  GLUCOSE 168* 206*  BUN 14 15  CREATININE 0.84 0.88  CALCIUM 8.7* 8.3*    Intake/Output Summary (Last 24 hours) at 06/28/2019 0851 Last data filed at 06/27/2019 1729 Gross per 24 hour  Intake -  Output 550 ml  Net -550 ml     Physical Exam: Vital Signs Blood pressure 119/75, pulse 87, temperature 97.9 F (36.6 C), temperature source Oral, resp. rate 16, height 6\' 1"  (1.854 m), weight (!) 144.7 kg, SpO2 99 %. Constitutional: No distress . Vital signs reviewed. HEENT: EOMI, oral membranes moist, NGT Neck: supple, #4 trach with some tan secretions. Speaks around trach to an extent Cardiovascular: RRR without murmur. No JVD    Respiratory: CTA Bilaterally without wheezes or rales. A few rhonchi unlabored  GI: BS +, non-tender, non-distended  Musculoskeletal:  General: No tendernessor edema.  Neurological: He isalertand oriented to person, place, and time.  Able to answer most orientation questions without difficulty. Able to follow simple motor commands.reasonable insight and awareness. Decreased LT left hemi-face, shoulder and leg. Decreased FMC LUE and LLE.  Skin: He isnot diaphoretic. Psych: pleasant and cooperative      Assessment/Plan: 1. 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  Physiatrist is providing  close team supervision and 24 hour management of active medical problems listed below.  Physiatrist and rehab team continue to assess barriers to discharge/monitor patient progress toward functional and medical goals  Care Tool:  Bathing              Bathing assist       Upper Body Dressing/Undressing Upper body dressing        Upper body assist      Lower Body Dressing/Undressing Lower body dressing            Lower body assist       Toileting Toileting    Toileting assist Assist for toileting: Maximal Assistance - Patient 25 - 49%     Transfers Chair/bed transfer  Transfers assist           Locomotion Ambulation   Ambulation assist              Walk 10 feet activity   Assist           Walk 50 feet activity   Assist           Walk 150 feet activity   Assist           Walk 10 feet on uneven surface  activity   Assist           Wheelchair     Assist               Wheelchair 50 feet with 2 turns activity    Assist            Wheelchair 150 feet activity     Assist          Blood pressure 119/75, pulse 87, temperature 97.9 F (36.6 C), temperature source Oral, resp. rate 16, height 6\' 1"  (1.854 m), weight (!) 144.7 kg, SpO2 99 %.  Medical Problem List and Plan: 1. Reduced Mobility, swllow function and self care secondary to left lateral medullary infarct, Wallenberg type syndrome, course complicated by multiple medical issues and prolonged hospital stay  -begin therapies today 2. Antithrombotics: -DVT/anticoagulation:Pharmaceutical:Lovenox -antiplatelet therapy: ASA/Plavix 3. Pain Management:n/A 4. Mood:LCSW to follow for evaluation and support. -antipsychotic agents: Continue seroquel 5. Neuropsych: This patientis not fullycapable of making decisions onhisown behalf. 6. Skin/Wound Care:Routine pressure relief measures. 7.  Fluids/Electrolytes/Nutrition:NPO with tube feeds for now  -I personally reviewed the patient's labs today.   8. T2DM: Hgb A1c-10.6. Now on lantus with SSI for elevated BS. Continue to monitor BS every 4 hours.   -fair control. Increase lantus to 28u 9. Recurrent PNA--H flu on 9/7: Has been on prolonged antibiotics--narrowed to rocephin on 9/8--afebrile, exam stable today  -9/8 CXR reviewed and stable with patchy airspace disease 10. Respiratory failure s/p trach: Continue 28 % ATC.   -PMV during day  -Scopolamine patch/ robinul to help manage secretions===has been effective 11. Tachycardia: Continues to have resting HR in 110's. Atropine may have contributed to this. Also on Scopolamine and glycopyrrolate 12. Severe dysphagia: severe pharyngeal dysphagia on MBS yesterday  -Will likely need PEG-  -Continue TF for now with strict aspiration precautions.      LOS: 1 days A FACE TO FACE EVALUATION WAS PERFORMED  Michael Bright 06/28/2019, 8:51 AM

## 2019-06-29 ENCOUNTER — Inpatient Hospital Stay (HOSPITAL_COMMUNITY): Payer: BC Managed Care – PPO

## 2019-06-29 ENCOUNTER — Inpatient Hospital Stay (HOSPITAL_COMMUNITY): Payer: Medicare Other

## 2019-06-29 ENCOUNTER — Inpatient Hospital Stay (HOSPITAL_COMMUNITY): Payer: Medicare Other | Admitting: Speech Pathology

## 2019-06-29 ENCOUNTER — Other Ambulatory Visit (HOSPITAL_COMMUNITY): Payer: Medicare Other

## 2019-06-29 DIAGNOSIS — R4189 Other symptoms and signs involving cognitive functions and awareness: Secondary | ICD-10-CM

## 2019-06-29 LAB — CBC WITH DIFFERENTIAL/PLATELET
Abs Immature Granulocytes: 0.02 10*3/uL (ref 0.00–0.07)
Basophils Absolute: 0.1 10*3/uL (ref 0.0–0.1)
Basophils Relative: 1 %
Eosinophils Absolute: 0.1 10*3/uL (ref 0.0–0.5)
Eosinophils Relative: 2 %
HCT: 37.3 % — ABNORMAL LOW (ref 39.0–52.0)
Hemoglobin: 12 g/dL — ABNORMAL LOW (ref 13.0–17.0)
Immature Granulocytes: 0 %
Lymphocytes Relative: 45 %
Lymphs Abs: 2.8 10*3/uL (ref 0.7–4.0)
MCH: 29.9 pg (ref 26.0–34.0)
MCHC: 32.2 g/dL (ref 30.0–36.0)
MCV: 93 fL (ref 80.0–100.0)
Monocytes Absolute: 0.6 10*3/uL (ref 0.1–1.0)
Monocytes Relative: 10 %
Neutro Abs: 2.6 10*3/uL (ref 1.7–7.7)
Neutrophils Relative %: 42 %
Platelets: 275 10*3/uL (ref 150–400)
RBC: 4.01 MIL/uL — ABNORMAL LOW (ref 4.22–5.81)
RDW: 11.8 % (ref 11.5–15.5)
WBC: 6.2 10*3/uL (ref 4.0–10.5)
nRBC: 0 % (ref 0.0–0.2)

## 2019-06-29 LAB — COMPREHENSIVE METABOLIC PANEL
ALT: 40 U/L (ref 0–44)
AST: 29 U/L (ref 15–41)
Albumin: 2.5 g/dL — ABNORMAL LOW (ref 3.5–5.0)
Alkaline Phosphatase: 61 U/L (ref 38–126)
Anion gap: 9 (ref 5–15)
BUN: 15 mg/dL (ref 8–23)
CO2: 25 mmol/L (ref 22–32)
Calcium: 8.4 mg/dL — ABNORMAL LOW (ref 8.9–10.3)
Chloride: 101 mmol/L (ref 98–111)
Creatinine, Ser: 0.9 mg/dL (ref 0.61–1.24)
GFR calc Af Amer: >60 mL/min (ref >60)
GFR calc non Af Amer: >60 mL/min (ref >60)
Glucose, Bld: 171 mg/dL — ABNORMAL HIGH (ref 70–99)
Potassium: 3.6 mmol/L (ref 3.5–5.1)
Sodium: 135 mmol/L (ref 135–145)
Total Bilirubin: 0.5 mg/dL (ref 0.3–1.2)
Total Protein: 6.7 g/dL (ref 6.5–8.1)

## 2019-06-29 LAB — GLUCOSE, CAPILLARY
Glucose-Capillary: 148 mg/dL — ABNORMAL HIGH (ref 70–99)
Glucose-Capillary: 152 mg/dL — ABNORMAL HIGH (ref 70–99)
Glucose-Capillary: 155 mg/dL — ABNORMAL HIGH (ref 70–99)
Glucose-Capillary: 179 mg/dL — ABNORMAL HIGH (ref 70–99)
Glucose-Capillary: 227 mg/dL — ABNORMAL HIGH (ref 70–99)
Glucose-Capillary: 270 mg/dL — ABNORMAL HIGH (ref 70–99)

## 2019-06-29 LAB — URINALYSIS, ROUTINE W REFLEX MICROSCOPIC
Bilirubin Urine: NEGATIVE
Glucose, UA: NEGATIVE mg/dL
Hgb urine dipstick: NEGATIVE
Ketones, ur: NEGATIVE mg/dL
Leukocytes,Ua: NEGATIVE
Nitrite: NEGATIVE
Protein, ur: NEGATIVE mg/dL
Specific Gravity, Urine: 1.021 (ref 1.005–1.030)
pH: 6 (ref 5.0–8.0)

## 2019-06-29 LAB — TROPONIN I (HIGH SENSITIVITY): Troponin I (High Sensitivity): 5 ng/L (ref <18)

## 2019-06-29 MED ORDER — CHLORHEXIDINE GLUCONATE 0.12 % MT SOLN
OROMUCOSAL | Status: AC
  Administered 2019-06-29: 15 mL via OROMUCOSAL
  Filled 2019-06-29: qty 15

## 2019-06-29 MED ORDER — INSULIN GLARGINE 100 UNIT/ML ~~LOC~~ SOLN
28.0000 [IU] | Freq: Every day | SUBCUTANEOUS | Status: DC
  Administered 2019-06-30 – 2019-07-02 (×3): 28 [IU] via SUBCUTANEOUS
  Filled 2019-06-29 (×3): qty 0.28

## 2019-06-29 NOTE — Progress Notes (Signed)
Physical Therapy Session Note  Patient Details  Name: Michael Bright MRN: [id number] Date of Birth: [birth year]  Today's Date: 06/29/2019 PT Individual Time: 1100-1210 PT Individual Time Calculation (min): 70 min   Short Term Goals: Week 1:  PT Short Term Goal 1 (Week 1): Pt will ambulate 50 ft with LRAD & CGA. PT Short Term Goal 2 (Week 1): Pt will complete bed mobility without hospital bed features with CGA. PT Short Term Goal 3 (Week 1): Pt will complete all transfers with CGA & LRAD. PT Short Term Goal 4 (Week 1): Pt will negotiate 4 steps with B rails & min assist for strengthening.  Skilled Therapeutic Interventions/Progress Updates:   Pt resting in bed,  He denied pain.  Using bed features, supine>sit with superivosn and extra time.  Pt c/o mild dizziness upon sitting up, which resolved in 1 minute.    Ortho delivered L air cast.  PT donned air cast and bil shoes, and asked pt to bring in lace up sneakers if possible.  With Rw and bed raised, sit> stand with CGA; stand pivot with min assist to w/c to R. R ELE switched for fit and positioning for symmetry of bil LEs.   Seated BP 127/62; HR 91. For improved seating in w.c, pillow added behind back and gait belt around thighs for neutral hip rotation.  Pt stated that he was more comfortable.    Therapeutic exercises performed with LEs to increase strength for functional mobility. : with strap around thigh for hip alignment- seated in w/c, 10 x 2 bil adductor squeezes, trunk flexion, bil heel raises.  BP after ex= 120/100, HRE 113.    Sit> stand from w/c with 2 attempts, min assist to RW.  Gait training x 25' with RW, L air [place], min assist.  Cues for upright trunk and wider BOS.  At end of session, pt seated in w/c with strap around thigh for sustained stretch bil hip external rotators.  Needs left at hand and seat pad alarm set.    PT called for [place], LPN to provide deep suction due to frequent gurgling/coughing but inability  to cough up secretions.      Therapy Documentation Precautions:  Precautions Precautions: Fall, Other (comment) Precaution Comments: NG tube, watch BP, HR & O2, PMV with therapies, trach collar Restrictions Weight Bearing Restrictions: No   Vital Signs: Therapy Vitals Pulse Rate: (!) 113 BP: (!) 120/100 Patient Position (if appropriate): Sitting after seated ex Oxygen Therapy SpO2: 99 % O2 Device: Aerosol Mask O2 Flow Rate (L/min): 3 L/min FiO2 (%): 28 % Pain: "not much; just a few aches and pains"         Therapy/Group: Individual Therapy  Rowen Hur 06/29/2019, 12:23 PM

## 2019-06-29 NOTE — Procedures (Signed)
Tracheostomy Change Note  Patient Details:   Name: Michael Bright DOB: [birth year] MRN: [id number]    Airway Documentation: Lurline Idol downsized to a #4 Shiley cuffless per MD orders. Patient had positive color change on ETCO2 detector. Lurline Idol is secured, trach ties changed.     Evaluation  O2 sats: stable throughout Complications: No apparent complications Patient did tolerate procedure well. Bilateral Breath Sounds: Clear, Diminished    [name] 06/29/2019, 1:17 PM

## 2019-06-29 NOTE — Significant Event (Signed)
Rapid Response Event Note  Overview:Called d/t questionable seizure. About 0300, pt had brief moment of unresponsiveness. Pt was talking to staff and became stiff, starting shaking for about 15 seconds, and had a R sided gaze. After event, pt neuro status back to baseline. Time Called: 0315 Arrival Time: 0320 Event Type: Neurologic  Initial Focused Assessment: Pt laying in bed with eyes closed, alert and oriented x 3 with intermittent confusion, moves all extremities, and follows commands. Pupils 2 and slugglish. NIH-0, CBG-155, HR-108, BP-130/85, RR-19, SpO2-97% on .28 TC.  This pt is well known to rapid response. His current mental status is unchanged compared to the last time he was seen by RR. Pt does have some confusion/delirium which was seen on a previous RR call.    Based on RNs description of event, ??? whether he had a seizure however pt doesn't appear to be post ictal on my assessment.    Interventions: EKG, PCXR, CBC, CMP, U/A and cx, EEG Plan of Care (if not transferred): Await test results. Continue to monitor pt. Call RRT if further assistance needed. Event Summary: Name of Physician Notified: Love, PA at 0335    at    Outcome: Stayed in room and stabalized  Event End Time: 0335  Dillard [place]

## 2019-06-29 NOTE — Procedures (Signed)
Patient Name: Michael Bright  MRN: [id number]  Epilepsy Attending: Lora Havens  Referring Physician/Provider: Reesa Chew, PA Date: 06/29/2019 Duration: 22.37 mins  Patient history: 63yo M with left medullary acute infarct and seizure. EEG to evaluate for seizure.  Level of alertness: awake  AEDs during EEG study: None  Technical aspects: This EEG study was done with scalp electrodes positioned according to the 10-20 International system of electrode placement. Electrical activity was acquired at a sampling rate of 500Hz  and reviewed with a high frequency filter of 70Hz  and a low frequency filter of 1Hz . EEG data were recorded continuously and digitally stored.   DESCRIPTION: The posterior dominant rhythm consists of 9-10 Hz activity of moderate voltage (25-35 uV) seen predominantly in posterior head regions, symmetric and reactive to eye opening and eye closing. There was intermittent 2-4Hz  generalized theta-delta slowing, maximal in left temporal region. Hyperventilation and photic stimulation were not performed.  ABNORMALITY: 1. Intermittent slow, generalized, maximum left temporal  IMPRESSION: This study is suggestive of cortical dysfunction in left temporal region as well as mild diffuse encephalopathy, non specific to etiology. No seizures or definite epileptiform discharges were seen throughout the recording.  Annissa Andreoni Barbra Sarks

## 2019-06-29 NOTE — Progress Notes (Signed)
While rounding on patient, patient observed agitated. Attempted to make patient comfortable without success. While at the bedside patient eyes rolled back in head and began to convulse and tense up eyes opened with gaze to the right and a 10-15 sec period of unresponsiveness. Neuro status baseline, patient has some delirium confused events but alert and oriented x3. Disoriented to situation.  Charge nurse sue called to bedside. V/S taken WNL. Rapid response nurse called to assess.

## 2019-06-29 NOTE — Progress Notes (Signed)
Social Work Assessment and Plan   Patient Details  Name: Michael Bright MRN: [id number] Date of Birth: 1956-02-27  Today's Date: 06/29/2019  Problem List:  Patient Active Problem List   Diagnosis Date Noted  . Stroke, small vessel (HCC) 06/27/2019  . Aspiration pneumonia (HCC) 06/14/2019  . Essential hypertension 06/14/2019  . Type 2 diabetes mellitus with hyperlipidemia (HCC) 06/14/2019  . Acute respiratory failure (HCC)   . Acute CVA (cerebrovascular accident) (HCC) 05/30/2019  . 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 Dysphagia 05/30/2019  . Renal insufficiency 05/30/2019  . Diplopia   . Weakness    Past Medical History:  Past Medical History:  Diagnosis Date  . Diabetes [facility](HCC)    Past Surgical History:  Past Surgical History:  Procedure Laterality Date  . CHOLECYSTECTOMY     Social History:  reports that he has never smoked. He has never used smokeless tobacco. He reports current alcohol use. He reports that he does not use drugs.  Family / Support Systems Marital Status: Married Patient Roles: Spouse, Parent Spouse/Significant Other: wife, Wende BushyBernadette Haring @ [phone](C) [phone] Children: son, [name] and daughter, Gearldine BienenstockBrandy - both living locally and working f/t Anticipated Caregiver: wife and son (62 yo son in evenings) (daughter lives nearby) Ability/Limitations of Caregiver: Min A for wife (lifiting restrictions <5 lbs) ; (son able to physically assist as able) Caregiver Availability: 24/7(wife to take FMLA) Family Dynamics: Pt describes wife and children as very supportive.  Wife has already submitted and received approval for her FMLA and fully intends to provide 24/7 care.  Social History Preferred language: English Religion: Non-Denominational Cultural Background: NA Read: Yes Write: Yes Employment Status: Retired Marine scientistLegal History/Current Legal Issues: None Guardian/Conservator: None - per MD, pt is not fully  capable of making decisions on his own behalf - defer to spouse.   Abuse/Neglect Abuse/Neglect Assessment Can Be Completed: Yes Physical Abuse: Denies Verbal Abuse: Denies Sexual Abuse: Denies Exploitation of patient/patient's resources: Denies Self-Neglect: Denies  Emotional Status Pt's affect, behavior and adjustment status: Pt sitting up in bed and able to provide basic, personal information.  He is aware he suffered a stroke and admits he is frustrated with his limitations.  Pleasant and motivated for therapy.  Will likely refer for neuropsychology consult to further assess coping. Recent Psychosocial Issues: None Psychiatric History: None Substance Abuse History: None  Patient / Family Perceptions, Expectations & Goals Pt/Family understanding of illness & functional limitations: Pt and wife with good awareness of CVA and functional limitations/ need for CIR. Premorbid pt/family roles/activities: Pt completely independent PTA at home and in community. Anticipated changes in roles/activities/participation: Per overall goals of supervision, wife to assume primary caregiver support role. Pt/family expectations/goals: "I want to get this out (cortrak)"  [employer] Community Agencies: None Premorbid Home Care/DME Agencies: None Transportation available at discharge: yes Resource referrals recommended: Neuropsychology  Discharge Planning Living Arrangements: Spouse/significant other Support Systems: Spouse/significant other, Children Type of Residence: Private residence Insurance Resources: [employer] Entertainment[employer]icare, Media plannerrivate Insurance (specify)(BCBS (primary)) Financial Screen Referred: No Living Expenses: Own Money Management: Spouse, Patient Does the patient have any problems obtaining your medications?: No Home Management: pt and spouse Patient/Family Preliminary Plans: Pt to d/c home with wife providing 24/7 support and additional assist of adult children. Social Work Anticipated  Follow Up Needs: HH/OP Expected length of stay: 14-18 days  Clinical Impression Unfortunate gentleman who was completely independent at home/ community prior to this CVA.  He is motivated for CIR tx  and has good support (24/7 available) form wife and adult children.  Admits frustrations with his limitations and will monitor mood with probable referral to neuropsychology.  Will follow for support and d/c planning needs.  Karrina Lye 06/29/2019, 1:52 PM

## 2019-06-29 NOTE — Progress Notes (Signed)
Speech Language Pathology Daily Session Note  Patient Details  Name: Michael Bright MRN: [id number] Date of Birth: 1955/12/18  Today's Date: 06/29/2019 SLP Individual Time: 1300-1345 SLP Individual Time Calculation (min): 45 min  Short Term Goals: Week 1: SLP Short Term Goal 1 (Week 1): Pt will demonstrate tolerance of PMSV in 45 minute intervals with vitals signs remaining WFL. SLP Short Term Goal 2 (Week 1): Pt will preform throat clear and swallow to reduce vocal qaulity wetness with mod A verbal cues. SLP Short Term Goal 3 (Week 1): Pt will consume ice chip trials to preserve swallow function with moderate s/s aspiration. SLP Short Term Goal 4 (Week 1): Pt will complete semi-complex problem solving tasks with min A verbal cues. SLP Short Term Goal 5 (Week 1): Pt will self-awareness and self-correct of functional errors in semi-complex problem solving tasks with min A verbal cues. SLP Short Term Goal 6 (Week 1): Pt will utilizie memory compensatory strategies to recall new, daily information with min A verbal cues.  Skilled Therapeutic Interventions: Skilled treatment session focused on communication and dysphagia goals. Patient was downsized to a #4 cuffless trach. Patient tolerated the PMSV with all vitals remaining WFL and without signs of distress for ~30 minutes. Patient continues to demonstrate a hoarse vocal quality but is unable to expectorate secretions orally with continues copious amounts of secretions expelled from the trach hub. Patient requires intermittent assistance to manage secretions, therefore, recommend patient continue PMSV intermittently with staff. Patient recalled 1 of 3 pharyngeal strengthening exercises from his previous session but was unable to perform the [facility] despite Max A multimodal cues. Patient educated on importance of utilizing a throat clear and effortful swallows to clear secretions within the upper airway resulting in a wet vocal quality. Patient verbalized  and demonstrated understanding. Patient left upright in wheelchair with all needs within reach. Continue with current plan of care.      Pain No/Denies Pain   Therapy/Group: Individual Therapy  [name] 06/29/2019, 3:09 PM

## 2019-06-29 NOTE — Plan of Care (Signed)
  Problem: Consults Goal: RH STROKE PATIENT EDUCATION Description: See Patient Education module for education specifics  Outcome: Progressing   Problem: RH BOWEL ELIMINATION Goal: RH STG MANAGE BOWEL WITH ASSISTANCE Description: STG Manage Bowel with  mod I Assistance. Outcome: Progressing   Problem: RH BLADDER ELIMINATION Goal: RH STG MANAGE BLADDER WITH ASSISTANCE Description: STG Manage Bladder With min Assistance Outcome: Progressing   Problem: RH SAFETY Goal: RH STG ADHERE TO SAFETY PRECAUTIONS W/ASSISTANCE/DEVICE Description: STG Adhere to Safety Precautions With cues/reminders Assistance/Device. Outcome: Progressing   Problem: RH PAIN MANAGEMENT Goal: RH STG PAIN MANAGED AT OR BELOW PT'S PAIN GOAL Description: At or below level 5 Outcome: Progressing   Problem: RH KNOWLEDGE DEFICIT Goal: RH STG INCREASE KNOWLEDGE OF DIABETES Description: Pt and wife will be able to manage diabetes using handouts for medication, dietary restrictions and cbg monitoring independently Outcome: Progressing Goal: RH STG INCREASE KNOWLEDGE OF HYPERTENSION Description: Pt and wife will be able to manage HTN using handouts for medication, dietary restrictions and monitoring independently Outcome: Progressing Goal: RH STG INCREASE KNOWLEDGE OF DYSPHAGIA/FLUID INTAKE Outcome: Progressing Goal: RH STG INCREASE KNOWLEGDE OF HYPERLIPIDEMIA Description: Pt and wife will be able to manage HLD using handouts for medication, dietary restrictions and monitoring independently Outcome: Progressing Goal: RH STG INCREASE KNOWLEDGE OF STROKE PROPHYLAXIS Description: Pt and wife will be able to manage risks for secondary stroke using handouts for medication, dietary restrictions independently Outcome: Progressing

## 2019-06-29 NOTE — Progress Notes (Signed)
While providing trach care to patient he was observed stiffing up, and went unresponsive. This lasted around 5 seconds and patient remained conscious. Neuro went back to baseline after episode. Pam Love,Pa informed and in room with patient.

## 2019-06-29 NOTE — Progress Notes (Signed)
RT NOTES: Unable to give patient breathing treatment at this time. Patient working with physical therapy. Will continue to monitor.

## 2019-06-29 NOTE — Progress Notes (Addendum)
Occupational Therapy Session Note  Patient Details  Name: Michael Bright MRN: [id number] Date of Birth: [birth year]  Today's Date: 06/29/2019 OT Individual Time: 1324-4010 OT Individual Time Calculation (min): 58 min    Short Term Goals: Week 1:  OT Short Term Goal 1 (Week 1): Pt will complete 1 grooming task in standing with HR > 120 bpm OT Short Term Goal 2 (Week 1): Pt will don shorts with min A OT Short Term Goal 3 (Week 1): Pt will complete toileting tasks with min A OT Short Term Goal 4 (Week 1): Pt will demonstrate improved safety awareness, requiring no more than min cueing during ADLs  Skilled Therapeutic Interventions/Progress Updates:     1;1. Pt received in bed agreeable to OT and at end of session reporting global "aches" Rn alerted to deliver pain medication. Pt completes supine>sitting EOB with MIN A and VC for midline orientation. Pt completes stand pivot transfer with MIN A overall during pivot and VC for knee extension/hand placement.pt completes bathing at sit to stand level with MIN-mod A for standing balance when washing peri area/buttocks. Pt dons shirt with S and pants with similar A as above to stand and advance pants past hips. Pt with 1 episode of knees buckling requiring mod +2 A to maintain posture. Pt completes oral care with suction toothbrush seated in w/c. Standing game of connect 4 with MIN A and VC for trunk extension/upright posture provided. Throughout session pt O2 >94% on RA and HR elevated up to 122 requiring increased time for rest breaks. Exited session with pt seated in w/c, exit alarm on and call light in reach  Therapy Documentation Precautions:  Precautions Precautions: Fall, Other (comment) Precaution Comments: NG tube, watch BP, HR & O2, PMV with therapies, trach collar Restrictions Weight Bearing Restrictions: No General:   Vital Signs: Therapy Vitals Pulse Rate: (!) 106 Resp: 18 Patient Position (if appropriate): Sitting Oxygen  Therapy SpO2: 96 % O2 Device: Room Air Pain:   ADL: ADL Eating: NPO Grooming: Setup Where Assessed-Grooming: Sitting at sink Upper Body Bathing: Supervision/safety Where Assessed-Upper Body Bathing: Edge of bed Lower Body Bathing: Moderate assistance Where Assessed-Lower Body Bathing: Edge of bed Upper Body Dressing: Minimal assistance Where Assessed-Upper Body Dressing: Edge of bed Lower Body Dressing: Moderate assistance Where Assessed-Lower Body Dressing: Edge of bed Toileting: Moderate assistance Where Assessed-Toileting: Bedside Commode, Toilet Toilet Transfer: Moderate assistance Toilet Transfer Method: Counselling psychologist: Bedside commode, Grab bars Vision   Perception    Praxis   Exercises:   Other Treatments:     Therapy/Group: Individual Therapy  Tonny Branch 06/29/2019, 8:58 AM

## 2019-06-29 NOTE — Progress Notes (Signed)
Orthopedic Tech Progress Note Patient Details:  Michael Bright 1955/10/26 [id number]  Ortho Devices Type of Ortho Device: Ankle Air splint Ortho Device/Splint Location: left Ortho Device/Splint Interventions: Application   Post Interventions Patient Tolerated: Well Instructions Provided: Care of device   [place] Pink 06/29/2019, 11:38 AM

## 2019-06-29 NOTE — Progress Notes (Signed)
RT NOTES: Patient working with rehab staff, sitting up in chair at sink on room air. Sats 96% on room air at this time. Unable to do breathing treatment at this time d/t patient working with rehab. Will continue to monitor.

## 2019-06-29 NOTE — Care Management (Signed)
[place] Individual Statement of Services  Patient Name:  Michael Bright  Date:  06/29/2019  Welcome to the [facility].  Our goal is to provide you with an individualized program based on your diagnosis and situation, designed to meet your specific needs.  With this comprehensive rehabilitation program, you will be expected to participate in at least 3 hours of rehabilitation therapies Monday-Friday, with modified therapy programming on the weekends.  Your rehabilitation program will include the following services:  Physical Therapy (PT), Occupational Therapy (OT), Speech Therapy (ST), 24 hour per day rehabilitation nursing, Therapeutic Recreaction (TR), Neuropsychology, Case Management (Social Worker), Rehabilitation Medicine, Nutrition Services and Pharmacy Services  Weekly team conferences will be held on Tuesdays to discuss your progress.  Your Social Worker will talk with you frequently to get your input and to update you on team discussions.  Team conferences with you and your family in attendance may also be held.  Expected length of stay: 14-18 days   Overall anticipated outcome: supervision  Depending on your progress and recovery, your program may change. Your Social Worker will coordinate services and will keep you informed of any changes. Your Social Worker's name and contact numbers are listed  below.  The following services may also be recommended but are not provided by the [facility] will be made to provide these services after discharge if needed.  Arrangements include referral to agencies that provide these services.  Your insurance has been verified to be:  [place]; Medicare Your primary doctor is:  None  Pertinent information will be shared with your doctor and your insurance company.  Social  Worker:  [place], [place] or [phone]   Information discussed with and copy given to patient by: Lennart Pall, 06/29/2019, 3:55 PM

## 2019-06-29 NOTE — Progress Notes (Signed)
Occupational Therapy Session Note  Patient Details  Name: Michael Bright MRN: [id number] Date of Birth: June 08, 1956  Today's Date: 06/29/2019 OT Individual Time: 1430-1458 OT Individual Time Calculation (min): 28 min    Short Term Goals: Week 1:  OT Short Term Goal 1 (Week 1): Pt will complete 1 grooming task in standing with HR > 120 bpm OT Short Term Goal 2 (Week 1): Pt will don shorts with min A OT Short Term Goal 3 (Week 1): Pt will complete toileting tasks with min A OT Short Term Goal 4 (Week 1): Pt will demonstrate improved safety awareness, requiring no more than min cueing during ADLs  Skilled Therapeutic Interventions/Progress Updates:     1:1. Pt received in w/c with no report of pain. Pt remains >95% O2 during session, however PMSV not on entire time d/t coughing. Pt completes standing trials at counter sorting soda cans into appropriate shelves with forced use of LUE for NMR/dynamic reaching. Pt with 1 episode of knees buckling, however pt able to catch/maintian balance with MIN A. Pt with 2 episodes of grip slip dropping can back onto countertop with LUE. Exited session with pt seated in w/c, call light in reach and all needs met.   Therapy Documentation Precautions:  Precautions Precautions: Fall, Other (comment) Precaution Comments: NG tube, watch BP, HR & O2, PMV with therapies, trach collar Restrictions Weight Bearing Restrictions: No General:   Vital Signs: Therapy Vitals Temp: 97.6 F (36.4 C) Temp Source: Oral Pulse Rate: 95 Resp: 16 BP: 118/86 Patient Position (if appropriate): Sitting Oxygen Therapy SpO2: 93 % O2 Device: Tracheostomy Collar O2 Flow Rate (L/min): 5 L/min FiO2 (%): 28 % Pain:   ADL: ADL Eating: NPO Grooming: Setup Where Assessed-Grooming: Sitting at sink Upper Body Bathing: Supervision/safety Where Assessed-Upper Body Bathing: Edge of bed Lower Body Bathing: Moderate assistance Where Assessed-Lower Body Bathing: Edge of  bed Upper Body Dressing: Minimal assistance Where Assessed-Upper Body Dressing: Edge of bed Lower Body Dressing: Moderate assistance Where Assessed-Lower Body Dressing: Edge of bed Toileting: Moderate assistance Where Assessed-Toileting: Bedside Commode, Toilet Toilet Transfer: Moderate assistance Toilet Transfer Method: Counselling psychologist: Bedside commode, Grab bars Vision   Perception    Praxis   Exercises:   Other Treatments:     Therapy/Group: Individual Therapy  Tonny Branch 06/29/2019, 2:59 PM

## 2019-06-29 NOTE — Progress Notes (Signed)
Spoke with Reesa Chew PA orders received.

## 2019-06-29 NOTE — Progress Notes (Signed)
[facility] PHYSICAL MEDICINE & REHABILITATION PROGRESS NOTE   Subjective/Complaints: Around 0300 last night nurse reported pt with eyes rolling back/convulsions with gaze fixed to right. No post ictal symptoms noted. Associated confusion. Pt states he feels fine this morning. Doesn't seem to recall these events. He feels his secretions are better. Still req sxn  ROS: Limited due to cognitive/behavioral    Objective:   Ct Abdomen Wo Contrast  Result Date: 06/28/2019 CLINICAL DATA:  Dysphagia. Evaluate anatomy for percutaneous gastrostomy tube placement. EXAM: CT ABDOMEN WITHOUT CONTRAST TECHNIQUE: Multidetector CT imaging of the abdomen was performed following the standard protocol without IV contrast. COMPARISON:  Chest CT 06/26/2019 FINDINGS: Lower chest: Stable areas of consolidation involving both lower lobes. No significant pleural effusions. Hepatobiliary: Cholecystectomy. Normal appearance of the liver on this non contrast examination. Pancreas: Unremarkable. No pancreatic ductal dilatation or surrounding inflammatory changes. Spleen: Normal in size without focal abnormality. Adrenals/Urinary Tract: Normal adrenal glands. At least 2 tiny stones in left kidney lower pole. Tiny stone in the right kidney upper pole. Negative for hydronephrosis. Top of the bladder is partially visualized. Stomach/Bowel: There is a nasogastric tube with the tip in the distal stomach. There is a small amount of barium in the gastric fundus. Normal appearance of the stomach and duodenum. Barium within the transverse colon which is caudal to the stomach. The anatomy is amendable for percutaneous gastrostomy tube placement. No evidence to suggest a bowel obstruction. Evidence for high-riding cecum. Visualized appendix is unremarkable. Vascular/Lymphatic: Small amount of calcium involving the right common iliac artery. Otherwise, the visualized vascular structures are unremarkable. No significant lymph node enlargement in  the abdomen. Other: Negative for ascites.  Negative for free air. Musculoskeletal: No acute bone abnormality. IMPRESSION: 1. Anatomy is suitable for percutaneous gastrostomy tube placement. However, the barium will need to pass through the stomach prior to gastrostomy tube placement. 2. Stable consolidation and volume loss in the bilateral lower lobes. Findings are similar to the recent chest CT. 3. Nonobstructive tiny kidney stones. Electronically Signed   By: Markus Daft M.D.   On: 06/28/2019 10:32   Dg Chest 1 View  Result Date: 06/29/2019 CLINICAL DATA:  Aspiration EXAM: CHEST  1 VIEW COMPARISON:  06/27/2019 FINDINGS: Cardiac shadow is enlarged but stable. Tracheostomy tube and feeding catheter are seen. Lungs are well aerated bilaterally. Minimal left basilar atelectasis is noted. No bony abnormality is seen. IMPRESSION: Minimal left basilar atelectasis. Electronically Signed   By: Inez Catalina M.D.   On: 06/29/2019 08:58   Dg Chest 2 View  Result Date: 06/27/2019 CLINICAL DATA:  Aspiration EXAM: CHEST - 2 VIEW COMPARISON:  06/22/2019, CT 06/26/2019 FINDINGS: Tracheostomy tube is in place. Streaky basilar airspace opacities without significant change. Stable cardiomediastinal silhouette. No pneumothorax. IMPRESSION: 1. Similar appearance of patchy basilar airspace opacities. No new abnormality is evident. 2. Borderline cardiomegaly Electronically Signed   By: Donavan Foil M.D.   On: 06/27/2019 21:58   Recent Labs    06/28/19 0500 06/29/19 0528  WBC 5.6 6.2  HGB 12.2* 12.0*  HCT 37.3* 37.3*  PLT 273 275   Recent Labs    06/28/19 0500 06/29/19 0528  NA 135 135  K 3.9 3.6  CL 99 101  CO2 27 25  GLUCOSE 206* 171*  BUN 15 15  CREATININE 0.88 0.90  CALCIUM 8.3* 8.4*    Intake/Output Summary (Last 24 hours) at 06/29/2019 1023 Last data filed at 06/29/2019 0844 Gross per 24 hour  Intake 0 ml  Output 1875 ml  Net -1875 ml     Physical Exam: Vital Signs Blood pressure 130/85, pulse  (!) 106, temperature 98.3 F (36.8 C), resp. rate 18, height 6\' 1"  (1.854 m), weight (!) 141.9 kg, SpO2 96 %. Constitutional: No distress . Vital signs reviewed. HEENT: EOMI, oral membranes moist, NGT Neck: supple, #6 trach, minimal secretions Cardiovascular: RRR without murmur. No JVD    Respiratory: CTA Bilaterally with occ wheeze. No rhonchi.. Normal effort    GI: BS +, non-tender, non-distended  Musculoskeletal:  General: No tendernessor edema.  Neurological: alert and oriented today.  Able to answer most orientation questions without difficulty. Able to follow simple motor commands.fair insight and awareness. Decreased LT left hemi-face, shoulder and leg. Decreased FMC LUE and LLE ongoing.   Skin: He isnot diaphoretic. Psych: pleasant and cooperative      Assessment/Plan: 1. 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  Physiatrist is providing close team supervision and 24 hour management of active medical problems listed below.  Physiatrist and rehab team continue to assess barriers to discharge/monitor patient progress toward functional and medical goals  Care Tool:  Bathing    Body parts bathed by patient: Right arm, Left arm, Abdomen, Chest, Right upper leg, Left upper leg, Right lower leg, Left lower leg, Face, Front perineal area   Body parts bathed by helper: Buttocks     Bathing assist Assist Level: Minimal Assistance - Patient > 75%     Upper Body Dressing/Undressing Upper body dressing   What is the patient wearing?: Pull over shirt    Upper body assist Assist Level: Minimal Assistance - Patient > 75%    Lower Body Dressing/Undressing Lower body dressing      What is the patient wearing?: Pants     Lower body assist Assist for lower body dressing: Moderate Assistance - Patient 50 - 74%     Toileting Toileting    Toileting  assist Assist for toileting: Moderate Assistance - Patient 50 - 74%     Transfers Chair/bed transfer  Transfers assist  Chair/bed transfer activity did not occur: Safety/medical concerns  Chair/bed transfer assist level: Minimal Assistance - Patient > 75%     Locomotion Ambulation   Ambulation assist   Ambulation activity did not occur: Safety/medical concerns          Walk 10 feet activity   Assist  Walk 10 feet activity did not occur: Safety/medical concerns        Walk 50 feet activity   Assist Walk 50 feet with 2 turns activity did not occur: Safety/medical concerns         Walk 150 feet activity   Assist Walk 150 feet activity did not occur: Safety/medical concerns         Walk 10 feet on uneven surface  activity   Assist Walk 10 feet on uneven surfaces activity did not occur: Safety/medical concerns         Wheelchair     Assist Will patient use wheelchair at discharge?: (TBD) Type of Wheelchair: Manual    Wheelchair assist level: Supervision/Verbal cueing Max wheelchair distance: 10 ft    Wheelchair 50 feet with 2 turns activity    Assist    Wheelchair 50 feet with 2 turns activity did not occur: Safety/medical concerns       Wheelchair 150 feet activity     Assist  Blood pressure 130/85, pulse (!) 106, temperature 98.3 F (36.8 C), resp. rate 18, height 6\' 1"  (1.854 m), weight (!) 141.9 kg, SpO2 96 %.  Medical Problem List and Plan: 1. Reduced Mobility, swllow function and self care secondary to left lateral medullary infarct, Wallenberg type syndrome, course complicated by multiple medical issues and prolonged hospital stay  --Continue CIR therapies including PT, OT, and SLP  2. Antithrombotics: -DVT/anticoagulation:Pharmaceutical:Lovenox -antiplatelet therapy: ASA. Plavix on hold for PEG  3. Pain Management:n/A 4. Mood:LCSW to follow for evaluation and  support. -antipsychotic agents: Continue seroquel at HS, dc am dose 5. Neuropsych: This patientis not fullycapable of making decisions onhisown behalf. 6. Skin/Wound Care:Routine pressure relief measures. 7. Fluids/Electrolytes/Nutrition:NPO with tube feeds for now  -I personally reviewed the patient's follow up labs today.   8. T2DM: Hgb A1c-10.6. Now on lantus with SSI for elevated BS. Continue to monitor BS every 4 hours.   -improved control. Monitor on lantus 28u 9. Recurrent PNA--H flu on 9/7: Has been on prolonged antibiotics--narrowed to rocephin on 9/8--afebrile, exam stable today  -9/10 CXR reviewed and without acute disease. 10. Respiratory failure s/p trach: Continue 28 % ATC.   -PMV during day  -Scopolamine patch/ robinul to help manage secretions===has been effective  -downsize to #4 trach today. Should help some irritation of airway.   -remove NGT when PEG placed 11. Tachycardia: Continues to have resting HR in 110's. Atropine may have contributed to this. Also on Scopolamine and glycopyrrolate 12. Severe dysphagia: severe pharyngeal dysphagia on MBS yesterday  -appreciate IR follow up---PEG likely Monday as we hold plavix  -Continue TF for now with strict aspiration precautions. 13. Unresponsive episode: ?seizure vs vasovagal event related to suctioning  -EEG ordered  -hold on anticonvulsant pending EEG results.   -EKG reviewed and NSR   Greater than 35 total minutes was spent in examination of patient, assessment of pertinent data,  formulation of a treatment plan, and in discussion with patient and/or family.     LOS: 2 days A FACE TO FACE EVALUATION WAS PERFORMED  Ranelle OysterZachary T Swartz 06/29/2019, 10:23 AM

## 2019-06-29 NOTE — Progress Notes (Signed)
EEG complete - results pending 

## 2019-06-30 ENCOUNTER — Encounter (HOSPITAL_COMMUNITY): Payer: Self-pay | Admitting: Physician Assistant

## 2019-06-30 ENCOUNTER — Inpatient Hospital Stay (HOSPITAL_COMMUNITY): Payer: Medicare Other

## 2019-06-30 ENCOUNTER — Inpatient Hospital Stay (HOSPITAL_COMMUNITY): Payer: Medicare Other | Admitting: Speech Pathology

## 2019-06-30 ENCOUNTER — Inpatient Hospital Stay (HOSPITAL_COMMUNITY): Payer: Medicare Other | Admitting: Occupational Therapy

## 2019-06-30 DIAGNOSIS — I639 Cerebral infarction, unspecified: Secondary | ICD-10-CM

## 2019-06-30 DIAGNOSIS — E1165 Type 2 diabetes mellitus with hyperglycemia: Secondary | ICD-10-CM

## 2019-06-30 LAB — GLUCOSE, CAPILLARY
Glucose-Capillary: 108 mg/dL — ABNORMAL HIGH (ref 70–99)
Glucose-Capillary: 118 mg/dL — ABNORMAL HIGH (ref 70–99)
Glucose-Capillary: 130 mg/dL — ABNORMAL HIGH (ref 70–99)
Glucose-Capillary: 147 mg/dL — ABNORMAL HIGH (ref 70–99)
Glucose-Capillary: 150 mg/dL — ABNORMAL HIGH (ref 70–99)
Glucose-Capillary: 165 mg/dL — ABNORMAL HIGH (ref 70–99)

## 2019-06-30 MED ORDER — QUETIAPINE FUMARATE 50 MG PO TABS
150.0000 mg | ORAL_TABLET | Freq: Every day | ORAL | Status: DC
  Administered 2019-06-30 – 2019-07-13 (×14): 150 mg
  Filled 2019-06-30 (×14): qty 3

## 2019-06-30 MED ORDER — LORAZEPAM 0.5 MG PO TABS
0.5000 mg | ORAL_TABLET | Freq: Once | ORAL | Status: AC
  Administered 2019-06-30: 0.5 mg via ORAL
  Filled 2019-06-30: qty 1

## 2019-06-30 NOTE — IPOC Note (Signed)
Overall Plan of Care [facility](IPOC) Patient Details Name: Michael Bright MRN: [id number] DOB: [birth year]  Admitting Diagnosis: <principal problem not specified>  Hospital Problems: Active Problems:   Stroke, small vessel (HCC)     Functional Problem List: Nursing Bladder, Bowel, Medication Management, Safety, Nutrition, Endurance, Pain  PT Balance, Perception, Behavior, Safety, Edema, Sensory, Endurance, Pain, Motor  OT Balance, Safety, Perception, Cognition, Endurance, Motor, Pain, Vision, Sensory  SLP Cognition  TR         Basic ADL's: OT Grooming, Bathing, Dressing, Toileting     Advanced  ADL's: OT       Transfers: PT Bed Mobility, Bed to Chair, Furniture, Customer service managerCar  OT Toilet, Research scientist (life sciences)Tub/Shower     Locomotion: PT Ambulation, Psychologist, prison and probation servicesWheelchair Mobility, Stairs     Additional Impairments: OT None  SLP Swallowing, Social Cognition   Problem Solving, Memory, Awareness, Attention  TR      Anticipated Outcomes Item Anticipated Outcome  Self Feeding no goal set  Swallowing  Mod I participation in therapy only   Basic self-care  (S)  Toileting  (S)   Bathroom Transfers (S)  Bowel/Bladder  manage bowel with  mod I assist and bladder with min assist  Transfers  supervision with LRAD  Locomotion  supervision with LRAD  Communication     Cognition  Min-Supervision A  Pain  pain at or below level 5  Safety/Judgment  maintain safety with cues/reminders   Therapy Plan: PT Intensity: Minimum of 1-2 x/day ,45 to 90 minutes PT Frequency: 5 out of 7 days PT Duration Estimated Length of Stay: 1.5-2 weeks OT Intensity: Minimum of 1-2 x/day, 45 to 90 minutes OT Frequency: 5 out of 7 days OT Duration/Estimated Length of Stay: 14-18 days SLP Intensity: Minumum of 1-2 x/day, 30 to 90 minutes SLP Frequency: 3 to 5 out of 7 days SLP Duration/Estimated Length of Stay: 14-18 days   Due to the current state of emergency, patients may not be receiving their 3-hours of Medicare-mandated  therapy.   Team Interventions: Nursing Interventions Patient/Family Education, Bowel Management, Pain Management, Bladder Management, Medication Management, Discharge Planning  PT interventions Community reintegration, Ambulation/gait training, DME/adaptive equipment instruction, Neuromuscular re-education, Psychosocial support, Stair training, UE/LE Strength taining/ROM, Wheelchair propulsion/positioning, UE/LE Coordination activities, Therapeutic Activities, Skin care/wound management, Pain management, Functional electrical stimulation, Discharge planning, Balance/vestibular training, Cognitive remediation/compensation, Disease management/prevention, Functional mobility training, Splinting/orthotics, Patient/family education, Therapeutic Exercise, Visual/perceptual remediation/compensation  OT Interventions Balance/vestibular training, Discharge planning, Pain management, Self Care/advanced ADL retraining, Therapeutic Activities, UE/LE Coordination activities, Visual/perceptual remediation/compensation, Therapeutic Exercise, Skin care/wound managment, Patient/family education, Functional mobility training, Disease mangement/prevention, Cognitive remediation/compensation, FirefighterCommunity reintegration, Fish farm managerDME/adaptive equipment instruction, Neuromuscular re-education, Psychosocial support, UE/LE Strength taining/ROM, Wheelchair propulsion/positioning  SLP Interventions Cognitive remediation/compensation, Financial traderCueing hierarchy, Dysphagia/aspiration precaution training, Functional tasks, Internal/external aids, Patient/family education  TR Interventions    SW/CM Interventions Discharge Planning, Psychosocial Support, Patient/Family Education   Barriers to Discharge MD  Medical stability  Nursing      PT Trach, Nutrition means    OT Trach    SLP Auto-[employer]ers Insurancerach    SW       Team Discharge Planning: Destination: PT-Home ,OT- Home , SLP-Home Projected Follow-up: PT-Home health PT, 24 hour supervision/assistance, OT-   Home health OT, SLP-Home Health SLP, 24 hour supervision/assistance, Skilled Nursing facility Projected Equipment Needs: PT-To be determined, OT- To be determined, SLP-None recommended by SLP Equipment Details: PT- , OT-  Patient/family involved in discharge planning: PT- Patient,  OT-Patient, SLP-Patient  MD ELOS: 14-18 days Medical Rehab Prognosis:  Excellent Assessment: The patient has been admitted for CIR therapies with the diagnosis of CVA and debility related respiratory failure and multiple medical. The team will be addressing functional mobility, strength, stamina, balance, safety, adaptive techniques and equipment, self-care, bowel and bladder mgt, patient and caregiver education, trach mgt, swallowing, communication, cognition. Goals have been set at supervision for mobility and self-care and min assist to supervision for cognition. Will go home with PEG  Due to the current state of emergency, patients may not be receiving their 3 hours per day of Medicare-mandated therapy.    Meredith Staggers, MD, FAAPMR      See Team Conference Notes for weekly updates to the plan of care

## 2019-06-30 NOTE — Progress Notes (Signed)
RT called to bedside patient had pulled out his trach.  When RT arrived RN's at bedside with patient.  Patient satting high 80's and combative.  RT put in a new #4 Shiley cuffless trach.  Positive color change on end tidal, satting 97% on 5L 28% ATC, bilateral BS, good phonation.  Patient alert and no longer combative at this time.  RT will continue to monitor.

## 2019-06-30 NOTE — Progress Notes (Signed)
RT NOTES: Patient working with rehab staff, sitting at sink on room air. Sats 98% on room air, patient in no distress at this time. Will continue to monitor.

## 2019-06-30 NOTE — Progress Notes (Signed)
Physical Therapy Note  Patient Details  Name: Michael Bright MRN: [id number] Date of Birth: 11-24-1955 Today's Date: 06/30/2019    Pt initially declining therapy due to waiting on a breathing treatment & wanting to be deep suctioned & reports SOB but SpO2 >90% in bed with trach collar. RN made aware & suctioned pt but reports he's received all scheduled breathing treatments. Returned following RN suctioning pt & pt still declines participation, reporting he's feeling unwell, stating he's SOB & requesting he do therapy later. Pt left in bed with RN in room, will f/u per POC.  Waunita Schooner 06/30/2019, 10:33 AM

## 2019-06-30 NOTE — Progress Notes (Signed)
Called to room by staff pt was in bed very agitated and experiencing delirium. Pt  had pulled out his tracheostomy and was grabbing at his coretrak tube.Pt is attempting to hit the staff and is refusing to lift his head to insert obturator. Pt's hands held and head lifted to insert obturator. Pt remains agitated and is yelling that he wants the police called. RT able to insert new Shiley. Mittens applied.

## 2019-06-30 NOTE — Progress Notes (Signed)
Patients wife called per patient's request. Pt's wife spoke with patient. Pt still states "call the police". Updated patient's wife on patient being confused and pulling out trach.

## 2019-06-30 NOTE — Progress Notes (Signed)
During day shift,  bedside nurse observed patient having two separate episodes of what appears to be a type of vagel response episode. Both episodes were during trach  Suctioning while the patient was coughing. Patient appears to to go unresponsive for aprox 5-10 seconds then quickly awakens with a startled affect but is alert and oriented and able to be quickly calmed. During the evening patients wife was at bedside and reported she witnessed another episode but it was not elicited by coughing or any activity.

## 2019-06-30 NOTE — Progress Notes (Signed)
Occupational Therapy Session Note  Patient Details  Name: Michael Bright MRN: [id number] Date of Birth: 1955-11-05  Today's Date: 06/30/2019 OT Individual Time: 3500-9381 OT Individual Time Calculation (min): 48 min    Short Term Goals: Week 1:  OT Short Term Goal 1 (Week 1): Pt will complete 1 grooming task in standing with HR > 120 bpm OT Short Term Goal 2 (Week 1): Pt will don shorts with min A OT Short Term Goal 3 (Week 1): Pt will complete toileting tasks with min A OT Short Term Goal 4 (Week 1): Pt will demonstrate improved safety awareness, requiring no more than min cueing during ADLs  Skilled Therapeutic Interventions/Progress Updates:    Pt seen for OT session focusing on functional standing balance/endurance and sit>stand. Pt sitting up in w/c upon arrival, denied pain and agreeable to tx session. Pt on 5L supplemental O2 at rest, 100%. Session completed with 2L supplemental O2 and pt stating >98% with activity. At end of session, he transferred via stand pivot to EOB and returned to supine while on RA, 100%. Pt left off supplemental O2 at end of session, cont pulse ox donned and RN made aware of pt's position. He completed stand pivot transfers throughout session with RW and overall steadying assist when powering into standing due to L lean and assist for controlled descent.  In therapy gym, completed dynamic standing task, reaching to place horseshoes on basketball rim set at shoulder height. CGA overall for dynamic standing balance with pt initially alternating UE support on RW progressing to completing without UE support. Completed x5 trials, tolerating ~1 minute in standing before requiring seated rest break with assist for controlled descent.  From slightly elevated EOM, completed x5 sit>stand without UE support, with CGA and VCs to facilitate controlled descent, much improved from previous reps.  Pt returned to room at end of session, returned to supine and left with all needs  in reach and bed alarm on.   Therapy Documentation Precautions:  Precautions Precautions: Fall, Other (comment) Precaution Comments: NG tube, watch BP, HR & O2, PMV with therapies, trach collar Restrictions Weight Bearing Restrictions: No   Therapy/Group: Individual Therapy  Hamdi Kley L 06/30/2019, 12:54 PM

## 2019-06-30 NOTE — Progress Notes (Signed)
Upon entering the room patient was yelling call the police per nurse tech and RN. Patient's trach was found laying in the bed per nurse tech Raquel Sarna D.Per Norberta Keens RN obturator was placed in trach site by Norberta Keens RN. Respiratory therapist was called. Trach placed back in patient. Respiratory therapist verified placement.  Patient's current O2 is 97% on 5L 28 % ATC.  Pam Love PA-C made aware. Pam Love stated to place mittens on patient.

## 2019-06-30 NOTE — Progress Notes (Addendum)
Patient agitated and yelling out " help me" attempted to assist patient and suction him but became more agitated stating leave me alone you trying to choke me again,Call the police attempted to swing out with command to leave him alone. RT notified to assist in suctioning patient,.Notified Dr Letta Pate order received for Ativan 0.5 mg po and administered

## 2019-06-30 NOTE — Progress Notes (Signed)
Pt remains confused at this time pt states to "call the police that he has been chocked". #4 shiley trach was placed by respiratory therapist still remains in place. VS remains stable at this time.

## 2019-06-30 NOTE — Progress Notes (Addendum)
[facility] PHYSICAL MEDICINE & REHABILITATION PROGRESS NOTE   Subjective/Complaints: Pt with restless, agitated evening again. Pulled trach out. Another episode where he briefly became unresponsive for about 10 seconds. Pt in mittens when I arrived. Pt is aware that he was confused last night  ROS: Patient denies fever, rash, sore throat, blurred vision, nausea, vomiting, diarrhea, shortness of breath or chest pain, joint or back pain, headache, or mood change.   Objective:   Ct Abdomen Wo Contrast  Result Date: 06/28/2019 CLINICAL DATA:  Dysphagia. Evaluate anatomy for percutaneous gastrostomy tube placement. EXAM: CT ABDOMEN WITHOUT CONTRAST TECHNIQUE: Multidetector CT imaging of the abdomen was performed following the standard protocol without IV contrast. COMPARISON:  Chest CT 06/26/2019 FINDINGS: Lower chest: Stable areas of consolidation involving both lower lobes. No significant pleural effusions. Hepatobiliary: Cholecystectomy. Normal appearance of the liver on this non contrast examination. Pancreas: Unremarkable. No pancreatic ductal dilatation or surrounding inflammatory changes. Spleen: Normal in size without focal abnormality. Adrenals/Urinary Tract: Normal adrenal glands. At least 2 tiny stones in left kidney lower pole. Tiny stone in the right kidney upper pole. Negative for hydronephrosis. Top of the bladder is partially visualized. Stomach/Bowel: There is a nasogastric tube with the tip in the distal stomach. There is a small amount of barium in the gastric fundus. Normal appearance of the stomach and duodenum. Barium within the transverse colon which is caudal to the stomach. The anatomy is amendable for percutaneous gastrostomy tube placement. No evidence to suggest a bowel obstruction. Evidence for high-riding cecum. Visualized appendix is unremarkable. Vascular/Lymphatic: Small amount of calcium involving the right common iliac artery. Otherwise, the visualized vascular structures  are unremarkable. No significant lymph node enlargement in the abdomen. Other: Negative for ascites.  Negative for free air. Musculoskeletal: No acute bone abnormality. IMPRESSION: 1. Anatomy is suitable for percutaneous gastrostomy tube placement. However, the barium will need to pass through the stomach prior to gastrostomy tube placement. 2. Stable consolidation and volume loss in the bilateral lower lobes. Findings are similar to the recent chest CT. 3. Nonobstructive tiny kidney stones. Electronically Signed   By: Richarda Overlie M.D.   On: 06/28/2019 10:32   Dg Chest 1 View  Result Date: 06/29/2019 CLINICAL DATA:  Aspiration EXAM: CHEST  1 VIEW COMPARISON:  06/27/2019 FINDINGS: Cardiac shadow is enlarged but stable. Tracheostomy tube and feeding catheter are seen. Lungs are well aerated bilaterally. Minimal left basilar atelectasis is noted. No bony abnormality is seen. IMPRESSION: Minimal left basilar atelectasis. Electronically Signed   By: Alcide Clever M.D.   On: 06/29/2019 08:58   Recent Labs    06/28/19 0500 06/29/19 0528  WBC 5.6 6.2  HGB 12.2* 12.0*  HCT 37.3* 37.3*  PLT 273 275   Recent Labs    06/28/19 0500 06/29/19 0528  NA 135 135  K 3.9 3.6  CL 99 101  CO2 27 25  GLUCOSE 206* 171*  BUN 15 15  CREATININE 0.88 0.90  CALCIUM 8.3* 8.4*    Intake/Output Summary (Last 24 hours) at 06/30/2019 0921 Last data filed at 06/30/2019 0700 Gross per 24 hour  Intake 750 ml  Output 950 ml  Net -200 ml     Physical Exam: Vital Signs Blood pressure 123/74, pulse 93, temperature 98.2 F (36.8 C), resp. rate 18, height 6\' 1"  (1.854 m), weight (!) 141.3 kg, SpO2 96 %. Constitutional: No distress . Vital signs reviewed. HEENT: EOMI, oral membranes moist Neck: #4 trach with tan secretions present. No odor.  Cardiovascular:  RRR without murmur. No JVD    Respiratory: a few scattered wheezes and rhonchi.  GI: BS +, non-tender, non-distended  Musculoskeletal:  General: No  tendernessor edema.  Neurological: alert and oriented to person, hospital, month. Fair insight this morning.  Decreased LT left hemi-face, shoulder and leg. Decreased FMC LUE and LLE ongoing.   Skin: He isnot diaphoretic. Psych: cooperative but flat      Assessment/Plan: 1. 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  Physiatrist is providing close team supervision and 24 hour management of active medical problems listed below.  Physiatrist and rehab team continue to assess barriers to discharge/monitor patient progress toward functional and medical goals  Care Tool:  Bathing    Body parts bathed by patient: Right arm, Left arm, Abdomen, Chest, Right upper leg, Left upper leg, Right lower leg, Left lower leg, Face, Front perineal area, Buttocks   Body parts bathed by helper: Buttocks     Bathing assist Assist Level: Minimal Assistance - Patient > 75%     Upper Body Dressing/Undressing Upper body dressing   What is the patient wearing?: Pull over shirt    Upper body assist Assist Level: Minimal Assistance - Patient > 75%    Lower Body Dressing/Undressing Lower body dressing      What is the patient wearing?: Pants, Underwear/pull up     Lower body assist Assist for lower body dressing: Moderate Assistance - Patient 50 - 74%     Toileting Toileting    Toileting assist Assist for toileting: Moderate Assistance - Patient 50 - 74%     Transfers Chair/bed transfer  Transfers assist  Chair/bed transfer activity did not occur: Safety/medical concerns  Chair/bed transfer assist level: Minimal Assistance - Patient > 75%     Locomotion Ambulation   Ambulation assist   Ambulation activity did not occur: Safety/medical concerns  Assist level: Minimal Assistance - Patient > 75% Assistive device: Walker-rolling Max distance: 25   Walk 10 feet  activity   Assist  Walk 10 feet activity did not occur: Safety/medical concerns  Assist level: Minimal Assistance - Patient > 75% Assistive device: Walker-rolling, Orthosis   Walk 50 feet activity   Assist Walk 50 feet with 2 turns activity did not occur: Safety/medical concerns         Walk 150 feet activity   Assist Walk 150 feet activity did not occur: Safety/medical concerns         Walk 10 feet on uneven surface  activity   Assist Walk 10 feet on uneven surfaces activity did not occur: Safety/medical concerns         Wheelchair     Assist Will patient use wheelchair at discharge?: (TBD) Type of Wheelchair: Manual    Wheelchair assist level: Supervision/Verbal cueing Max wheelchair distance: 10 ft    Wheelchair 50 feet with 2 turns activity    Assist    Wheelchair 50 feet with 2 turns activity did not occur: Safety/medical concerns       Wheelchair 150 feet activity     Assist          Blood pressure 123/74, pulse 93, temperature 98.2 F (36.8 C), resp. rate 18, height 6\' 1"  (1.854 m), weight (!) 141.3 kg, SpO2 96 %.  Medical Problem List and Plan: 1. Reduced Mobility, swllow function and self care secondary to left lateral medullary infarct, Wallenberg type syndrome, course complicated by multiple medical  issues and prolonged hospital stay  --Continue CIR therapies including PT, OT, and SLP  2. Antithrombotics: -DVT/anticoagulation:Pharmaceutical:Lovenox -antiplatelet therapy: ASA. Plavix on hold for PEG  3. Pain Management:n/A 4. Mood/delirium: although improved from acute has been an ongoing issue -antipsychotic agents: continues to demonstrate HS confusion. Will increase seroquel and adjust timing to 2100. Have stopped am dose  -ua negative, ucx pending, other labs and images ok 5. Neuropsych: This patientis not fullycapable of making decisions onhisown behalf. 6. Skin/Wound Care:Routine  pressure relief measures. 7. Fluids/Electrolytes/Nutrition:NPO with tube feeds for now   follow up labs next week.   8. T2DM: Hgb A1c-10.6. Now on lantus with SSI for elevated BS. Continue to monitor BS every 4 hours.   -improving control. Increase lantus to 30u 9. Recurrent PNA--H flu on 9/7: Has been on prolonged antibiotics--narrowed to rocephin on 9/8--ends 9/12  -afebrile currently  -9/10 CXR reviewed and without acute disease. 10. Respiratory failure s/p trach: Continue 28 % ATC.   -PMV during day  -Scopolamine patch/ robinul to help manage secretions===has been effective  -downsized to #4 trach 9/10. Should help some irritation of airway.   -remove NGT when PEG placed  -has significant OSA. Will need CPAP when decannulated. May still be able to decannulate prior discharge but would like to see secretions a bit better first.  11. Tachycardia: HR better, in 90's now.   12. Severe dysphagia: severe pharyngeal dysphagia on MBS yesterday  -appreciate IR follow up---PEG placement Monday as we hold plavix  -Continue TF for now with strict aspiration precautions. 13. Brief unresponsive episodes (10-15 secs): ?seizures vs vasovagal events related to suctioning  -EEG performed and does not demonstrate seizure activity  -hold on anticonvulsant at this time   -EKG reviewed and NSR        LOS: 3 days A FACE TO FACE EVALUATION WAS PERFORMED  Ranelle OysterZachary T Swartz 06/30/2019, 9:21 AM

## 2019-06-30 NOTE — H&P (Signed)
Chief Complaint: Dysphagia  Referring Physician(s): Sandria Manly Evlyn Kanner  Supervising Physician: Gilmer Mor  Patient Status: [facility] [place] - In-pt  History of Present Illness: Michael Bright is a 63 y.o. 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  He currently has a NGT (Panda/Dobhoff) in place for tube feeds.  We are asked to place a gastrostomy tube.  He takes Plavix and last dose was 06/28/2019.  He is alert and oriented during my visit and understands the need for Gastrostomy tube.  Past Medical History:  Diagnosis Date   Diabetes [facility])     Past Surgical History:  Procedure Laterality Date   CHOLECYSTECTOMY      Allergies: Patient has no known allergies.  Medications: Prior to Admission medications   Medication Sig Start Date End Date Taking? Authorizing Provider  acetaminophen (TYLENOL) 160 MG/5ML solution Place 20.3 mLs (650 mg total) into feeding tube every 4 (four) hours as needed for mild pain (or temp > 37.5 C (99.5 F)). 06/27/19   Calvert Cantor, MD  Amino Acids-Protein Hydrolys (FEEDING SUPPLEMENT, PRO-STAT SUGAR FREE 64,) LIQD Place 30 mLs into feeding tube 3 (three) times daily. 06/27/19   Calvert Cantor, MD  amLODipine (NORVASC) 10 MG tablet Place 1 tablet (10 mg total) into feeding tube daily. 06/28/19   Calvert Cantor, MD  aspirin 81 MG chewable tablet Place 1 tablet (81 mg total) into feeding tube daily. 06/28/19   Calvert Cantor, MD  atorvastatin (LIPITOR) 40 MG tablet Place 1 tablet (40 mg total) into feeding tube daily at 6 PM. 06/27/19   Calvert Cantor, MD  bisacodyl (DULCOLAX) 10 MG suppository Place 1 suppository (10 mg total) rectally daily as needed for moderate constipation. 06/27/19   Calvert Cantor, MD  budesonide (PULMICORT) 0.25 MG/2ML nebulizer solution Take 2 mLs (0.25 mg total) by nebulization 2 (two) times daily. 06/27/19   Calvert Cantor, MD  cefTRIAXone 2 g in sodium chloride 0.9 % 100 mL Inject 2 g into the vein daily. 06/28/19   Calvert Cantor, MD    clopidogrel (PLAVIX) 75 MG tablet Place 1 tablet (75 mg total) into feeding tube daily. 06/28/19   Calvert Cantor, MD  docusate (COLACE) 50 MG/5ML liquid Place 10 mLs (100 mg total) into feeding tube 2 (two) times daily as needed for mild constipation. 06/27/19   Calvert Cantor, MD  enoxaparin (LOVENOX) 80 MG/0.8ML injection Inject 0.7 mLs (70 mg total) into the skin daily. 06/28/19   Calvert Cantor, MD  famotidine (PEPCID) 40 MG/5ML suspension Place 2.5 mLs (20 mg total) into feeding tube 2 (two) times daily. 06/27/19   Calvert Cantor, MD  finasteride (PROSCAR) 5 MG tablet Take 1 tablet (5 mg total) by mouth daily. 06/28/19   Calvert Cantor, MD  glycopyrrolate (ROBINUL) 1 MG tablet Place 2 tablets (2 mg total) into feeding tube 2 (two) times daily. 06/27/19   Calvert Cantor, MD  insulin aspart (NOVOLOG) 100 UNIT/ML injection Inject 0-15 Units into the skin every 4 (four) hours. 06/27/19   Calvert Cantor, MD  insulin glargine (LANTUS) 100 UNIT/ML injection Inject 0.25 mLs (25 Units total) into the skin daily. 06/28/19   Calvert Cantor, MD  Nutritional Supplements (FEEDING SUPPLEMENT, OSMOLITE 1.2 CAL,) LIQD Place 1,000 mLs into feeding tube continuous. 06/27/19   Calvert Cantor, MD  QUEtiapine (SEROQUEL) 100 MG tablet Place 1 tablet (100 mg total) into feeding tube at bedtime. 06/27/19   Calvert Cantor, MD  QUEtiapine (SEROQUEL) 50 MG tablet Place 1 tablet (50 mg total) into feeding tube  daily. 06/28/19   Debbe Odea, MD  scopolamine (TRANSDERM-SCOP) 1 MG/3DAYS Place 1 patch (1.5 mg total) onto the skin every 3 (three) days. 06/27/19   Debbe Odea, MD  senna-docusate (SENOKOT-S) 8.6-50 MG tablet Place 1 tablet into feeding tube at bedtime as needed for mild constipation or moderate constipation. 06/27/19   Debbe Odea, MD     History reviewed. No pertinent family history.  Social History   Socioeconomic History   Marital status: Married    Spouse name: Not on file   Number of children: Not on file   Years of  education: Not on file   Highest education level: Not on file  Occupational History   Not on file  Social Needs   Financial resource strain: Not on file   Food insecurity    Worry: Not on file    Inability: Not on file   Transportation needs    Medical: Not on file    Non-medical: Not on file  Tobacco Use   Smoking status: Never Smoker   Smokeless tobacco: Never Used  Substance and Sexual Activity   Alcohol use: Yes   Drug use: No   Sexual activity: Yes  Lifestyle   Physical activity    Days per week: Not on file    Minutes per session: Not on file   Stress: Not on file  Relationships   Social connections    Talks on phone: Not on file    Gets together: Not on file    Attends religious service: Not on file    Active member of club or organization: Not on file    Attends meetings of clubs or organizations: Not on file    Relationship status: Not on file  Other Topics Concern   Not on file  Social History Narrative   Not on file     Review of Systems: A 12 point ROS discussed and pertinent positives are indicated in the HPI above.  All other systems are negative.  Review of Systems  Vital Signs: BP 124/72 (BP Location: Left Arm)    Pulse 91    Temp 98.3 F (36.8 C) (Oral)    Resp 18    Ht 6\' 1"  (1.854 m)    Wt (!) 141.3 kg    SpO2 99%    BMI 41.10 kg/m   Physical Exam Vitals signs reviewed.  Constitutional:      Appearance: Normal appearance.  HENT:     Head: Normocephalic and atraumatic.  Eyes:     Extraocular Movements: Extraocular movements intact.  Neck:     Comments: Tracheostomy with collar Cardiovascular:     Rate and Rhythm: Normal rate and regular rhythm.  Pulmonary:     Effort: Pulmonary effort is normal.     Breath sounds: Normal breath sounds.  Abdominal:     General: There is no distension.     Palpations: Abdomen is soft.     Tenderness: There is no abdominal tenderness.  Musculoskeletal: Normal range of motion.  Skin:     General: Skin is warm and dry.  Neurological:     General: No focal deficit present.     Mental Status: He is alert and oriented to person, place, and time.  Psychiatric:        Mood and Affect: Mood normal.        Behavior: Behavior normal.        Thought Content: Thought content normal.        Judgment:  Judgment normal.     Imaging: Ct Abdomen Wo Contrast  Result Date: 06/28/2019 CLINICAL DATA:  Dysphagia. Evaluate anatomy for percutaneous gastrostomy tube placement. EXAM: CT ABDOMEN WITHOUT CONTRAST TECHNIQUE: Multidetector CT imaging of the abdomen was performed following the standard protocol without IV contrast. COMPARISON:  Chest CT 06/26/2019 FINDINGS: Lower chest: Stable areas of consolidation involving both lower lobes. No significant pleural effusions. Hepatobiliary: Cholecystectomy. Normal appearance of the liver on this non contrast examination. Pancreas: Unremarkable. No pancreatic ductal dilatation or surrounding inflammatory changes. Spleen: Normal in size without focal abnormality. Adrenals/Urinary Tract: Normal adrenal glands. At least 2 tiny stones in left kidney lower pole. Tiny stone in the right kidney upper pole. Negative for hydronephrosis. Top of the bladder is partially visualized. Stomach/Bowel: There is a nasogastric tube with the tip in the distal stomach. There is a small amount of barium in the gastric fundus. Normal appearance of the stomach and duodenum. Barium within the transverse colon which is caudal to the stomach. The anatomy is amendable for percutaneous gastrostomy tube placement. No evidence to suggest a bowel obstruction. Evidence for high-riding cecum. Visualized appendix is unremarkable. Vascular/Lymphatic: Small amount of calcium involving the right common iliac artery. Otherwise, the visualized vascular structures are unremarkable. No significant lymph node enlargement in the abdomen. Other: Negative for ascites.  Negative for free air. Musculoskeletal: No  acute bone abnormality. IMPRESSION: 1. Anatomy is suitable for percutaneous gastrostomy tube placement. However, the barium will need to pass through the stomach prior to gastrostomy tube placement. 2. Stable consolidation and volume loss in the bilateral lower lobes. Findings are similar to the recent chest CT. 3. Nonobstructive tiny kidney stones. Electronically Signed   By: Richarda OverlieAdam  Henn M.D.   On: 06/28/2019 10:32   Dg Chest 1 View  Result Date: 06/29/2019 CLINICAL DATA:  Aspiration EXAM: CHEST  1 VIEW COMPARISON:  06/27/2019 FINDINGS: Cardiac shadow is enlarged but stable. Tracheostomy tube and feeding catheter are seen. Lungs are well aerated bilaterally. Minimal left basilar atelectasis is noted. No bony abnormality is seen. IMPRESSION: Minimal left basilar atelectasis. Electronically Signed   By: Alcide CleverMark  Lukens M.D.   On: 06/29/2019 08:58   Dg Chest 1 View  Result Date: 06/13/2019 CLINICAL DATA:  Fever. EXAM: CHEST  1 VIEW COMPARISON:  Single-view of the chest 06/10/2019, 06/09/2019 and 06/04/2019. FINDINGS: Tracheostomy tube and feeding tube are in place. Mild bibasilar opacities are improved comparison to yesterday's examination. No pneumothorax or pleural effusion. Cardiomegaly. IMPRESSION: Improved bibasilar opacities compared to the most recent examination most compatible with decreased atelectasis. No new abnormality. Electronically Signed   By: Drusilla Kannerhomas  Dalessio M.D.   On: 06/13/2019 10:27   Dg Chest 2 View  Result Date: 06/27/2019 CLINICAL DATA:  Aspiration EXAM: CHEST - 2 VIEW COMPARISON:  06/22/2019, CT 06/26/2019 FINDINGS: Tracheostomy tube is in place. Streaky basilar airspace opacities without significant change. Stable cardiomediastinal silhouette. No pneumothorax. IMPRESSION: 1. Similar appearance of patchy basilar airspace opacities. No new abnormality is evident. 2. Borderline cardiomegaly Electronically Signed   By: Jasmine PangKim  Fujinaga M.D.   On: 06/27/2019 21:58   Dg Abd 1 View  Result  Date: 06/12/2019 CLINICAL DATA:  Reason for exam: vomiting Patient reports vomiting started last night. And has had constipation for more than 1 week. Denies any abdomen surgeries. Patient not marked for isolation. Tech wore mask and shield. EXAM: ABDOMEN - 1 VIEW COMPARISON:  06/10/2019 FINDINGS: The bowel gas pattern is normal. No radio-opaque calculi or other significant radiographic abnormality are seen.  IMPRESSION: Negative. Electronically Signed   By: Corlis Leak M.D.   On: 06/12/2019 08:13   Dg Abd 1 View  Result Date: 06/03/2019 CLINICAL DATA:  feeding tube dysfunction EXAM: ABDOMEN - 1 VIEW COMPARISON:  Radiograph 06/01/2019 FINDINGS: A feeding tube with tip in the distal in stomach/first portion duodenum. Tube unchanged in position from 06/01/2019. No evidence obstruction. Contrast within the nondistended colon. IMPRESSION: Feeding tube in the distal stomach/first portion duodenum. No apparent changed position. Electronically Signed   By: Genevive Bi M.D.   On: 06/03/2019 11:35   Dg Abd 1 View  Result Date: 06/01/2019 CLINICAL DATA:  Feeding tube placement EXAM: ABDOMEN - 1 VIEW COMPARISON:  None. FINDINGS: Feeding tube tip passes through the body of the stomach to the region of the pylorus. Small amount of contrast injected appears to fill the duodenum. Normal bowel gas pattern. IMPRESSION: Feeding tube tip in the region the pylorus, possibly proximal duodenum. Electronically Signed   By: Marlan Palau M.D.   On: 06/01/2019 09:51   Ct Angio Chest Pe W Or Wo Contrast  Result Date: 06/26/2019 CLINICAL DATA:  High probability for pulmonary embolus. Acute respiratory failure. EXAM: CT ANGIOGRAPHY CHEST WITH CONTRAST TECHNIQUE: Multidetector CT imaging of the chest was performed using the standard protocol during bolus administration of intravenous contrast. Multiplanar CT image reconstructions and MIPs were obtained to evaluate the vascular anatomy. CONTRAST:  80mL OMNIPAQUE IOHEXOL 350 MG/ML  SOLN COMPARISON:  None FINDINGS: Cardiovascular: No acute abnormality. The main pulmonary artery is patent. No saddle embolus or central obstructing pulmonary embolus identified. No lobar or segmental pulmonary artery filling defects identified. Normal heart size. No pericardial effusion. Mild aortic atherosclerosis. Mediastinum/Nodes: Tracheostomy tube tip is above the carina. There is a feeding tube in place. No enlarged mediastinal or hilar adenopathy. Lungs/Pleura: No pleural effusion. Bilateral lower lobe atelectasis, consolidation and volume loss identified. Lingular atelectasis is noted. Upper Abdomen: No acute abnormality. Musculoskeletal: Spondylosis identified. Review of the MIP images confirms the above findings. IMPRESSION: 1. No evidence for acute pulmonary embolus. 2. Bilateral lower lobe atelectasis, airspace consolidation and volume loss. Lingular atelectasis also noted. Electronically Signed   By: Signa Kell M.D.   On: 06/26/2019 15:37   Dg Chest Port 1 View  Result Date: 06/22/2019 CLINICAL DATA:  Chronic ventilator dependent respiratory failure with acute cough. EXAM: PORTABLE CHEST 1 VIEW COMPARISON:  06/15/2019 and earlier. FINDINGS: Tracheostomy tube tip in satisfactory position below the thoracic inlet. Cardiac silhouette upper normal in size to mildly enlarged, unchanged. Pulmonary vascularity normal without evidence of pulmonary edema. Improved aeration in the lung bases since the 06/15/2019 examination, with residual mild bibasilar atelectasis, LEFT greater than RIGHT. No new pulmonary parenchymal abnormalities. IMPRESSION: 1. Tracheostomy tube tip in satisfactory position below the thoracic inlet. 2. Improved aeration in the lung bases since the 06/15/19 examination with residual mild bibasilar atelectasis, LEFT greater than RIGHT. 3. No new abnormalities. Electronically Signed   By: Hulan Saas M.D.   On: 06/22/2019 15:54   Dg Chest Port 1 View  Result Date:  06/15/2019 CLINICAL DATA:  63 year old male with a history of dyspnea EXAM: PORTABLE CHEST 1 VIEW COMPARISON:  June 13, 2019 FINDINGS: Cardiomediastinal silhouette unchanged in size and contour. Unchanged tracheostomy. Unchanged enteric feeding tube terminating out of the field of view. Low lung volumes with reticular opacities bilaterally. No pneumothorax or pleural effusion. No new confluent airspace disease. IMPRESSION: Low lung volumes, with reticular opacities potentially representing atelectasis or mild edema. Unchanged tracheostomy.  Unchanged enteric feeding tube Electronically Signed   By: Gilmer Mor D.O.   On: 06/15/2019 08:47   Dg Chest Port 1 View  Result Date: 06/10/2019 CLINICAL DATA:  Weakness. EXAM: PORTABLE CHEST 1 VIEW COMPARISON:  Chest radiograph 06/09/2019 FINDINGS: Tracheostomy tube mid trachea. Enteric tube courses inferior to the diaphragm. Monitoring leads overlie the patient. Stable cardiomegaly. Similar-appearing bilateral mid and lower lung patchy areas of consolidation. No pleural effusion or pneumothorax. IMPRESSION: Similar bibasilar opacities favored to represent atelectasis. Electronically Signed   By: Annia Belt M.D.   On: 06/10/2019 09:51   Dg Chest Port 1 View  Result Date: 06/09/2019 CLINICAL DATA:  Post tracheostomy EXAM: PORTABLE CHEST 1 VIEW COMPARISON:  Portable exam 1232 hours compared to 06/04/2019 FINDINGS: Tracheostomy tube present projecting over tracheal air column. Borderline enlargement of cardiac silhouette. Mediastinal contour stable. Bibasilar atelectasis. Upper lungs clear. No pleural effusion or pneumothorax. IMPRESSION: New tracheostomy tube. Bibasilar atelectasis. Electronically Signed   By: Ulyses Southward M.D.   On: 06/09/2019 12:59   Dg Chest Port 1 View  Result Date: 06/04/2019 CLINICAL DATA:  Respiratory failure EXAM: PORTABLE CHEST 1 VIEW COMPARISON:  06/03/2019 FINDINGS: ET tube tip is above the carina. Feeding tube in place. The heart size  appears normal. There is no pleural effusion or edema identified. No airspace opacities identified. Mild subsegmental atelectasis within the lung bases. IMPRESSION: 1. Mild bibasilar atelectasis. 2. Stable ET tube. Electronically Signed   By: Signa Kell M.D.   On: 06/04/2019 08:47   Dg Chest Port 1 View  Result Date: 06/03/2019 CLINICAL DATA:  Respiratory failure EXAM: PORTABLE CHEST 1 VIEW COMPARISON:  Earlier today FINDINGS: Endotracheal tube tip just below the clavicular heads. The feeding tube at least reaches the stomach. Low volume chest with streaky infrahilar densities. No Kerley lines, air bronchogram, or pneumothorax. Artifact from EKG leads. IMPRESSION: 1. Unremarkable hardware positioning. 2. Streaky opacities favoring atelectasis. Electronically Signed   By: Marnee Spring M.D.   On: 06/03/2019 08:27   Dg Chest Port 1 View  Result Date: 06/03/2019 CLINICAL DATA:  Acute respiratory distress. EXAM: PORTABLE CHEST 1 VIEW COMPARISON:  Radiograph yesterday. FINDINGS: Weighted enteric tube is present and seen to the mid thorax, tip not well visualized. Low lung volumes. Stable cardiomegaly. Similar atelectasis without confluent airspace disease. No large pleural effusion or pneumothorax. IMPRESSION: Low lung volumes with stable cardiomegaly and scattered atelectasis. Electronically Signed   By: Narda Rutherford M.D.   On: 06/03/2019 03:06   Portable Chest X-ray  Result Date: 06/03/2019 CLINICAL DATA:  63 year old male status post intubation. EXAM: PORTABLE CHEST 1 VIEW COMPARISON:  Chest radiograph dated 06/02/2019 FINDINGS: There has been interval placement of an endotracheal tube with tip approximately 4.5 cm above the carina. The feeding tube extends into the right hemiabdomen with tip beyond the inferior margin of the image. No significant change in the aeration of the lungs. Stable cardiac silhouette. No acute osseous pathology. IMPRESSION: Interval placement of an endotracheal tube with  tip above the carina. Electronically Signed   By: Elgie Collard M.D.   On: 06/03/2019 01:57   Dg Chest Port 1 View  Result Date: 06/02/2019 CLINICAL DATA:  Respiratory difficulty EXAM: PORTABLE CHEST 1 VIEW COMPARISON:  06/01/2019 FINDINGS: Cardiac shadow is again enlarged but stable. Feeding catheter has been removed. The lungs are well aerated bilaterally. Minimal right basilar atelectasis is seen. No focal confluent infiltrate is noted. No bony abnormality is seen. IMPRESSION: Minimal right basilar atelectasis. Electronically Signed  By: Alcide CleverMark  Lukens M.D.   On: 06/02/2019 07:00   Dg Chest Port 1 View  Result Date: 06/01/2019 CLINICAL DATA:  Difficulty swallowing with laryngitis EXAM: PORTABLE CHEST 1 VIEW COMPARISON:  Radiograph May 30, 2019 FINDINGS: Lung volumes are diminished with increasing hazy opacities in the lung bases favored to reflect increasing areas of atelectasis. No pneumothorax. No effusion. No acute osseous or soft tissue abnormality. Cardiomediastinal contours are unremarkable. A transesophageal tube tip is distal to the GE junction. IMPRESSION: 1. Low lung volumes with increasing bibasilar atelectasis. 2. Lungs otherwise clear. Electronically Signed   By: Kreg ShropshirePrice  DeHay M.D.   On: 06/01/2019 15:22    Labs:  CBC: Recent Labs    06/26/19 0445 06/26/19 0746 06/28/19 0500 06/29/19 0528  WBC 4.5 6.8 5.6 6.2  HGB 18.2* 13.6 12.2* 12.0*  HCT 56.0* 41.7 37.3* 37.3*  PLT 149* 295 273 275    COAGS: Recent Labs    05/30/19 0151 06/09/19 0506  INR 1.0 1.1  APTT 21* 34    BMP: Recent Labs    06/21/19 0330 06/26/19 0445 06/28/19 0500 06/29/19 0528  NA 137 137 135 135  K 4.2 4.2 3.9 3.6  CL 95* 97* 99 101  CO2 32 31 27 25   GLUCOSE 222* 168* 206* 171*  BUN 13 14 15 15   CALCIUM 8.6* 8.7* 8.3* 8.4*  CREATININE 0.77 0.84 0.88 0.90  GFRNONAA >60 >60 >60 >60  GFRAA >60 >60 >60 >60    LIVER FUNCTION TESTS: Recent Labs    06/04/19 0655 06/05/19 0927   06/10/19 1625 06/11/19 0514 06/28/19 0500 06/29/19 0528  BILITOT 0.5 0.3  --   --   --  0.5 0.5  AST 27 24  --   --   --  27 29  ALT 31 27  --   --   --  36 40  ALKPHOS 71 71  --   --   --  63 61  PROT 6.7 6.5  --   --   --  6.5 6.7  ALBUMIN 2.7* 2.5*   < > 2.2* 2.2* 2.3* 2.5*   < > = values in this interval not displayed.    TUMOR MARKERS: No results for input(s): AFPTM, CEA, CA199, CHROMGRNA in the last 8760 hours.  Assessment and Plan:  Dysphagia secondary Wallenberg's syndrome.  Will proceed with placement of Gastrostomy tube on Monday.  Will order tube feeds stopped at MN Sunday night and will order to hold Lovenox.  Risks and benefits discussed with the patient including, but not limited to the need for a barium enema during the procedure, bleeding, infection, peritonitis, or damage to adjacent structures.  All of the patient's questions were answered, patient is agreeable to proceed. Consent signed and in chart.  Thank you for this interesting consult.  I greatly enjoyed meeting Michael KilRandolph Cassin and look forward to participating in their care.  A copy of this report was sent to the requesting provider on this date.  Electronically Signed: Gwynneth MacleodWENDY S Zakayla Martinec, PA-C   06/30/2019, 3:44 PM      I spent a total of 40 Minutes  in face to face in clinical consultation, greater than 50% of which was counseling/coordinating care for gastrostomy tube.

## 2019-06-30 NOTE — Progress Notes (Signed)
Speech Language Pathology Daily Session Note  Patient Details  Name: Michael Bright MRN: [id number] Date of Birth: November 27, 1955  Today's Date: 06/30/2019 SLP Individual Time: 1400-1425 SLP Individual Time Calculation (min): 25 min  Short Term Goals: Week 1: SLP Short Term Goal 1 (Week 1): Pt will demonstrate tolerance of PMSV in 45 minute intervals with vitals signs remaining WFL. SLP Short Term Goal 2 (Week 1): Pt will preform throat clear and swallow to reduce vocal qaulity wetness with mod A verbal cues. SLP Short Term Goal 3 (Week 1): Pt will consume ice chip trials to preserve swallow function with moderate s/s aspiration. SLP Short Term Goal 4 (Week 1): Pt will complete semi-complex problem solving tasks with min A verbal cues. SLP Short Term Goal 5 (Week 1): Pt will self-awareness and self-correct of functional errors in semi-complex problem solving tasks with min A verbal cues. SLP Short Term Goal 6 (Week 1): Pt will utilizie memory compensatory strategies to recall new, daily information with min A verbal cues.  Skilled Therapeutic Interventions: Skilled treatment session focused on communication and dysphagia goals. Upon arrival, patient with copious secretions expelling from his trach hub. SLP attempted to donn PMSV but patient unable to voice efficiently due to dried secretions within the PMSV. SLP educated and demonstrated how to clean the PMSV safely. Patient verbalized understanding and was able to express his wants/needs efficiently as he is able to voice effectively without valve in place. Patient also recalled swallowing exercises with overall supervision level verbal cues but demonstrated difficulty executing exercises, suspect due to severity of dysphagia. Patient left upright in bed with alarm on and all needs within reach. Continue with current plan of care.      Pain No/Denies Pain   Therapy/Group: Individual Therapy  Dia Donate 06/30/2019, 3:32 PM

## 2019-06-30 NOTE — Progress Notes (Signed)
Patient very agitated. Observed patient eyes roll back of head and became unresponsive for about 10 seconds. Patient is now currently very calm and oriented x 4. Pt made aware that he had pulled out of trach. Pt states he does not remember pulling out trach or being confused. Pt states he understands why mittens are on his hands and is very cooperative. Pt trach suctioned. Pt VS are stable.  Algis Liming PA made aware. Will continue to monitor patient neurological/ respiratory status.

## 2019-06-30 NOTE — Progress Notes (Signed)
Occupational Therapy Session Note  Patient Details  Name: Michael Bright MRN: [id number] Date of Birth: 01/26/1956  Today's Date: 06/30/2019 OT Individual Time: 1100-1158 OT Individual Time Calculation (min): 58 min    Short Term Goals: Week 1:  OT Short Term Goal 1 (Week 1): Pt will complete 1 grooming task in standing with HR > 120 bpm OT Short Term Goal 2 (Week 1): Pt will don shorts with min A OT Short Term Goal 3 (Week 1): Pt will complete toileting tasks with min A OT Short Term Goal 4 (Week 1): Pt will demonstrate improved safety awareness, requiring no more than min cueing during ADLs  Skilled Therapeutic Interventions/Progress Updates:    1;1. Pt received in bed agreeable to OT. Pt with pain in LUE but agreeable to tx for time being. Pt completes supine>sitting EOB with MIN A and SPT with RW and MIN A +2. Pt completes bathing UB at sink with set up and LB bathing iwht MIN A for sit to stand for peri bathing and VC for use of LHSS to wash B feet. Pt requires mod VC for hand placement during transitional movements. Pt requires mod VC for use of AE to doff and don socks seated in w/c. Pt completes donning brief and pants with MIN A overall at sit to stand level. No buckling of LEs this date. Pt uses suction toothbrush for oral care seated in w/c with supervision. Exited session with pt seated in w/c call light in reach and all need smet  Therapy Documentation Precautions:  Precautions Precautions: Fall, Other (comment) Precaution Comments: NG tube, watch BP, HR & O2, PMV with therapies, trach collar Restrictions Weight Bearing Restrictions: No General: General PT Missed Treatment Reason: Patient ill (Comment);Patient unwilling to participate Vital Signs: Therapy Vitals Pulse Rate: (!) 104 Resp: 18 Patient Position (if appropriate): Sitting Oxygen Therapy SpO2: 98 % O2 Device: Room Air O2 Flow Rate (L/min): 5 L/min FiO2 (%): 28 % Pain: Critical Care Pain Observation Tool  (CPOT) Facial Expression: Relaxed, neutral Body Movements: Absence of movements Muscle Tension: Relaxed Compliance with ventilator (intubated pts.): N/A Vocalization (extubated pts.): Talking in normal tone or no sound CPOT Total: 0 ADL: ADL Eating: NPO Grooming: Setup Where Assessed-Grooming: Sitting at sink Upper Body Bathing: Supervision/safety Where Assessed-Upper Body Bathing: Edge of bed Lower Body Bathing: Moderate assistance Where Assessed-Lower Body Bathing: Edge of bed Upper Body Dressing: Minimal assistance Where Assessed-Upper Body Dressing: Edge of bed Lower Body Dressing: Moderate assistance Where Assessed-Lower Body Dressing: Edge of bed Toileting: Moderate assistance Where Assessed-Toileting: Bedside Commode, Toilet Toilet Transfer: Moderate assistance Toilet Transfer Method: Counselling psychologist: Bedside commode, Grab bars Vision   Perception    Praxis   Exercises:   Other Treatments:     Therapy/Group: Individual Therapy  Tonny Branch 06/30/2019, 12:03 PM

## 2019-06-30 NOTE — Progress Notes (Signed)
Patient yelling out upon entering room patient pulling at [employer] tubing and swinging arms, upon approaching patient with NTRaquel Sarna) it was noted that Trach tube had been pulled out, CN was able to insert the obturator and RT was called immediately,,

## 2019-07-01 ENCOUNTER — Inpatient Hospital Stay (HOSPITAL_COMMUNITY): Payer: Medicare Other | Admitting: Speech Pathology

## 2019-07-01 ENCOUNTER — Inpatient Hospital Stay (HOSPITAL_COMMUNITY): Payer: Medicare Other | Admitting: Physical Therapy

## 2019-07-01 ENCOUNTER — Inpatient Hospital Stay (HOSPITAL_COMMUNITY): Payer: Medicare Other

## 2019-07-01 LAB — URINE CULTURE: Culture: NO GROWTH

## 2019-07-01 LAB — GLUCOSE, CAPILLARY
Glucose-Capillary: 124 mg/dL — ABNORMAL HIGH (ref 70–99)
Glucose-Capillary: 183 mg/dL — ABNORMAL HIGH (ref 70–99)
Glucose-Capillary: 135 mg/dL — ABNORMAL HIGH (ref 70–99)
Glucose-Capillary: 98 mg/dL (ref 70–99)
Glucose-Capillary: 97 mg/dL (ref 70–99)
Glucose-Capillary: 97 mg/dL (ref 70–99)

## 2019-07-01 MED ORDER — ENOXAPARIN SODIUM 80 MG/0.8ML ~~LOC~~ SOLN: 70.0000 mg | SUBCUTANEOUS | Status: DC

## 2019-07-01 MED ORDER — CITALOPRAM HYDROBROMIDE 10 MG PO TABS
10.0000 mg | ORAL_TABLET | Freq: Every day | ORAL | Status: DC
  Administered 2019-07-01 – 2019-07-17 (×16): 10 mg
  Filled 2019-07-01 (×16): qty 1

## 2019-07-01 NOTE — Progress Notes (Signed)
Reported by RT and Nursing staff increase agitation and yelling and pulling at tubes and trach, uncooperative episodes calling" for police", On call MD notified of changes with no new orders at this time,bilateral mittens applied and continuous reassurances to patient, ADLs, provided, Closely monitor,

## 2019-07-01 NOTE — Progress Notes (Signed)
Physical Therapy Session Note  Patient Details  Name: Michael Bright MRN: [id number] Date of Birth: 10-17-1956  Today's Date: 07/01/2019 PT Individual Time: 4166-0630 PT Individual Time Calculation (min): 54 min   Short Term Goals: Week 1:  PT Short Term Goal 1 (Week 1): Pt will ambulate 50 ft with LRAD & CGA. PT Short Term Goal 2 (Week 1): Pt will complete bed mobility without hospital bed features with CGA. PT Short Term Goal 3 (Week 1): Pt will complete all transfers with CGA & LRAD. PT Short Term Goal 4 (Week 1): Pt will negotiate 4 steps with B rails & min assist for strengthening.  Skilled Therapeutic Interventions/Progress Updates:    Pt received supine in bed and initially not agreeable to therapy session reporting he was cold and not feeling well but with minimal encouragement agreeable to get OOB. Supine>sit, HOB elevated and heavy reliance on bedrails, with min assist for trunk upright and balance. RN notified and present to detach NG tube during therapy session. While sitting EOB performed 3 sets of 20-30 reps of long arc quads with cuing for full ROM - able to maintain sitting balance with close supervision. With RN present, pt suddenly had strong L posterolateral trunk lean with R UE extended out to the side and a R gaze - pt able to communicate during this time stating he had a R arm muscle spasm and was feeling nauseous - required max assist to maintain trunk upright and return to midline. Sit>supine with mod assist for B LE management to allow supine rest break. Pt continues to report some nausea but no further R UE muscle spasm during session. While supine performed B LE heel slides 3 sets of ~20 reps each LE with cuing for proper form (decreased strength and coordination in L LE compared to R). Pt agreeable to progress towards OOB mobility. Supine>sit again with heavy reliance on bedrails and CGA/min assist for trunk upright. For remainder of session while pt sitting EOB he  continues to demonstrate L lateral trunk lean in sitting requiring repeated cuing to re-orient to midline with intermittent min assist and manual facilitation for increased R weight shift - constant close supervision or CGA for sitting balance safety. Sit>stand EOB>RW with mod assist for lifting into standing and balance due to L lateral lean in standing. Ambulated ~29ft x 3 (seated break between each) using RW with min/mod assist for balance and cuing for sequencing of LE stepping and AD management - demonstrates impaired L LE motor control/coordination when stepping. Repeated sit<>stands EOB<>RW with mod progressed to min/mod assist for lifting into standing and for balance upon coming to standing due to L lateral lean - cuing for increased hip/knee extension to come upright. Sit>supine with mod assist for B LE management. Pt left supine in bed with needs in reach, lines intact, and bed alarm on.  Therapy Documentation Precautions:  Precautions Precautions: Fall, Other (comment) Precaution Comments: NG tube, watch BP, HR & O2, PMV with therapies, trach collar Restrictions Weight Bearing Restrictions: No  Vital Signs: Pt not wearing PMV during therapy this session. Maintained on 5L/min O2 via tracheostomy collar during therapy session At end of ambulation pt's O2 was 79% but recovered to 94% within 20 seconds - questionable inaccurate reading while ambulating. Decreases to 88% during repeated sit<>stands but recovers to 95% within 30 seconds. Therapist having to use suction to clean around trach during session as pt having productive cough.   Pain: No reports of pain during therapy session.  Therapy/Group: Individual Therapy  Ginny Fortharly M Michael Bright, PT, DPT 07/01/2019, 5:40 PM

## 2019-07-01 NOTE — Progress Notes (Signed)
Occupational Therapy Session Note  Patient Details  Name: Michael Bright MRN: [id number] Date of Birth: Oct 07, 1956  Today's Date: 07/01/2019 OT Individual Time: 2542-7062 OT Individual Time Calculation (min): 86 min    Short Term Goals: Week 1:  OT Short Term Goal 1 (Week 1): Pt will complete 1 grooming task in standing with HR > 120 bpm OT Short Term Goal 2 (Week 1): Pt will don shorts with min A OT Short Term Goal 3 (Week 1): Pt will complete toileting tasks with min A OT Short Term Goal 4 (Week 1): Pt will demonstrate improved safety awareness, requiring no more than min cueing during ADLs  Skilled Therapeutic Interventions/Progress Updates:    1:1. Pt received in bed confused asking why he isnt allowed to wash himself up. Pt educated on having staff A d/t deficits. Pt agreeable to bathe and dress this date. All transfers, bathing and dressing completed at [facility] A level with RW for standing balance. Pt requires min fading to no VC for hand placemetn during transitional movements. Pt uses reacher and sock aide to doff/don socks with min VC to AE technique. O2 >95% on RA. Pt with increased L lean this date in seated requiring MOD VC for correction.   [employer] in standing with VC for wider BOS and correcting L lean in standing with MIN A for balance  RUE 25 targets selected 2.4 sec avg rxn time. 3.3 L half v 1.9 on R half LUE 14 targets achieved 4.2 seconds avg rxn time 3.4 L half v 4.7 on R half- increased tremulous movement with LUE and undershooting  Exited session with pt seated in w/c, call light in reach and exit alarm on  Therapy Documentation Precautions:  Precautions Precautions: Fall, Other (comment) Precaution Comments: NG tube, watch BP, HR & O2, PMV with therapies, trach collar Restrictions Weight Bearing Restrictions: No General:   Vital Signs: Therapy Vitals Pulse Rate: (!) 101 Resp: 16 Oxygen Therapy SpO2: 96 % O2 Device: Tracheostomy Collar O2 Flow Rate  (L/min): 5 L/min FiO2 (%): 28 % Pain:   ADL: ADL Eating: NPO Grooming: Setup Where Assessed-Grooming: Sitting at sink Upper Body Bathing: Supervision/safety Where Assessed-Upper Body Bathing: Edge of bed Lower Body Bathing: Moderate assistance Where Assessed-Lower Body Bathing: Edge of bed Upper Body Dressing: Minimal assistance Where Assessed-Upper Body Dressing: Edge of bed Lower Body Dressing: Moderate assistance Where Assessed-Lower Body Dressing: Edge of bed Toileting: Moderate assistance Where Assessed-Toileting: Bedside Commode, Toilet Toilet Transfer: Moderate assistance Toilet Transfer Method: Counselling psychologist: Bedside commode, Grab bars Vision   Perception    Praxis   Exercises:   Other Treatments:     Therapy/Group: Individual Therapy  Tonny Branch 07/01/2019, 10:16 AM

## 2019-07-01 NOTE — Progress Notes (Signed)
Monitor and assisted with general care, decrease agitation and responds more directly with questioning and staff after receiving medication.No acute distress or discomfort, .

## 2019-07-01 NOTE — Progress Notes (Addendum)
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 Recent Labs    06/29/19 0528  WBC 6.2  HGB 12.0*  HCT 37.3*  PLT 275   Recent Labs    06/29/19 0528  NA 135  K 3.6  CL 101  CO2 25  GLUCOSE 171*  BUN 15  CREATININE 0.90  CALCIUM 8.4*    Intake/Output Summary (Last 24 hours) at 07/01/2019 0954 Last data filed at 07/01/2019 0700 Gross per 24 hour  Intake 995 ml  Output 1700 ml  Net -705 ml     Physical Exam: Vital Signs Blood pressure 127/71, pulse (!) 101, temperature 99.7 F (37.6 C), temperature source Oral, resp. rate 16, height 6\' 1"  (1.854 m), weight (!) 146.2 kg, SpO2 96 %. Constitutional: No distress . Vital signs reviewed. HEENT: EOMI, oral membranes moist Neck: #4 trach with tan secretions present. No odor.  Cardiovascular: RRR without murmur. No JVD    Respiratory: a few scattered wheezes and rhonchi.  GI: BS +, non-tender, non-distended  Musculoskeletal:  General: No tendernessor edema.  Neurological: alert and oriented to person, hospital, month. Leans to left  Fair insight this morning remembers waking up with SOB last noc .  Decreased LT left hemi-face, shoulder and leg. Decreased FMC LUE and LLE ongoing.   Skin: He isnot diaphoretic. Psych: cooperative but flat      Assessment/Plan: 1. 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  Physiatrist is providing close team supervision and 24 hour management of active medical problems listed below.  Physiatrist  and rehab team continue to assess barriers to discharge/monitor patient progress toward functional and medical goals  Care Tool:  Bathing    Body parts bathed by patient: Right arm, Left arm, Abdomen, Chest, Right upper leg, Left upper leg, Right lower leg, Left lower leg, Face, Front perineal area, Buttocks   Body parts bathed by helper: Buttocks     Bathing assist Assist Level: Minimal Assistance - Patient > 75%     Upper Body Dressing/Undressing Upper body dressing   What is the patient wearing?: Pull over shirt    Upper body assist Assist Level: Supervision/Verbal cueing    Lower Body Dressing/Undressing Lower body dressing      What is the patient wearing?: Pants, Underwear/pull up     Lower body assist Assist for lower body dressing: Minimal Assistance - Patient > 75%     Toileting Toileting    Toileting assist Assist for toileting: Moderate Assistance - Patient 50 - 74%     Transfers Chair/bed transfer  Transfers assist  Chair/bed transfer activity did not occur: Safety/medical concerns  Chair/bed transfer assist level: Minimal Assistance - Patient > 75%     Locomotion Ambulation   Ambulation assist   Ambulation activity did not occur: Safety/medical concerns  Assist level: Minimal Assistance - Patient > 75% Assistive device: Walker-rolling Max distance: 25   Walk 10 feet activity   Assist  Walk 10 feet activity did not occur: Safety/medical concerns  Assist level: Minimal Assistance - Patient > 75% Assistive device: Walker-rolling, Orthosis  Walk 50 feet activity   Assist Walk 50 feet with 2 turns activity did not occur: Safety/medical concerns         Walk 150 feet activity   Assist Walk 150 feet activity did not occur: Safety/medical concerns         Walk 10 feet on uneven surface  activity   Assist Walk 10 feet on uneven surfaces activity did not occur: Safety/medical concerns         Wheelchair     Assist  Will patient use wheelchair at discharge?: (TBD) Type of Wheelchair: Manual    Wheelchair assist level: Supervision/Verbal cueing Max wheelchair distance: 10 ft    Wheelchair 50 feet with 2 turns activity    Assist    Wheelchair 50 feet with 2 turns activity did not occur: Safety/medical concerns       Wheelchair 150 feet activity     Assist          Blood pressure 127/71, pulse (!) 101, temperature 99.7 F (37.6 C), temperature source Oral, resp. rate 16, height 6\' 1"  (1.854 m), weight (!) 146.2 kg, SpO2 96 %.  Medical Problem List and Plan: 1. Reduced Mobility, swllow function and self care secondary to left lateral medullary infarct, Wallenberg type syndrome, course complicated by multiple medical issues and prolonged hospital stay  --Continue CIR therapies including PT, OT, and SLP  2. Antithrombotics: -DVT/anticoagulation:Pharmaceutical:Lovenox -antiplatelet therapy: ASA. Plavix on hold for PEG  3. Pain Management:n/A 4. Mood/delirium: although improved from acute has been an ongoing issue -antipsychotic agents: continues to demonstrate HS confusion. Will increase seroquel and adjust timing to 2100. Have stopped am dose Agitation confusion not typical of Wallenberg, may have excess anticholinergics on board, secreations look ok d/c scop patch and monitor   -ua negative, ucx pending, other labs and images ok 5. Neuropsych: This patientis not fullycapable of making decisions onhisown behalf. 6. Skin/Wound Care:Routine pressure relief measures. 7. Fluids/Electrolytes/Nutrition:NPO with tube feeds for now   follow up labs next week.   8. T2DM: Hgb A1c-10.6. Now on lantus with SSI for elevated BS. Continue to monitor BS every 4 hours.   -improving control. Increase lantus to 33u CBG (last 3)  Recent Labs    06/30/19 2339 07/01/19 0406 07/01/19 0810  GLUCAP 150* 124* 183*   9. Recurrent PNA--H flu on 9/7: Has been on  prolonged antibiotics--narrowed to rocephin on 9/8--ends 9/12  -afebrile currently  -9/10 CXR reviewed and without acute disease. 10. Respiratory failure s/p trach: Continue 28 % ATC.   -PMV during day  -Scopolamine patch/ robinul to help manage secretions===has been effective  -downsized to #4 trach 9/10. Should help some irritation of airway.   -remove NGT when PEG placed  -has significant OSA. Will need CPAP when decannulated. May still be able to decannulate prior discharge but would like to see secretions a bit better first.  11. Tachycardia: HR better, in 90's now.   12. Severe dysphagia: severe pharyngeal dysphagia on MBS yesterday  -appreciate IR follow up---PEG placement Monday as we hold plavix  -Continue TF for now with strict aspiration precautions. 13. Brief unresponsive episodes none repeorted fo r24 h  ?seizures vs vasovagal events related to suctioning  -EEG performed and does not demonstrate seizure activity  -hold on anticonvulsant at this time   -EKG reviewed and NSR    14.  Add celexa for anxiety     LOS: 4 days A FACE TO FACE EVALUATION WAS PERFORMED  Erick Colace 07/01/2019,  9:54 AM

## 2019-07-01 NOTE — Progress Notes (Signed)
Patient removed his trach collar and was trying to remove his trach. RN notified and trach collar placed back on patient. Trach secured, airway patent. Will continue to monitor patient.

## 2019-07-01 NOTE — Progress Notes (Signed)
Speech Language Pathology Daily Session Note  Patient Details  Name: Michael Bright MRN: [id number] Date of Birth: May 02, 1956  Today's Date: 07/01/2019 SLP Individual Time: 6468-0321 SLP Individual Time Calculation (min): 40 min  Short Term Goals: Week 1: SLP Short Term Goal 1 (Week 1): Pt will demonstrate tolerance of PMSV in 45 minute intervals with vitals signs remaining WFL. SLP Short Term Goal 2 (Week 1): Pt will preform throat clear and swallow to reduce vocal qaulity wetness with mod A verbal cues. SLP Short Term Goal 3 (Week 1): Pt will consume ice chip trials to preserve swallow function with moderate s/s aspiration. SLP Short Term Goal 4 (Week 1): Pt will complete semi-complex problem solving tasks with min A verbal cues. SLP Short Term Goal 5 (Week 1): Pt will self-awareness and self-correct of functional errors in semi-complex problem solving tasks with min A verbal cues. SLP Short Term Goal 6 (Week 1): Pt will utilizie memory compensatory strategies to recall new, daily information with min A verbal cues.  Skilled Therapeutic Interventions: Patient received skilled SLP services targeting dysphagia goals. Patient declined PO trials of ice chips stating "they make me get choked". Patient completed swallowing exercises including: effortful swallow x5, lingual resistance x10, and base of tongue retraction x10 with min verbal cues. Patient was unable to complete masako. Patient required frequent prolonged rest breaks swallowing exercises due to fatigue. At the end of therapy session patient left upright in bed, all needs within reach, and bed alarm activated.  Pain Pain Assessment Pain Scale: 0-10 Pain Score: 0-No pain  Therapy/Group: Individual Therapy  Cristy Folks, [place] 07/01/2019, 2:43 PM

## 2019-07-02 ENCOUNTER — Inpatient Hospital Stay (HOSPITAL_COMMUNITY): Payer: Medicare Other

## 2019-07-02 LAB — GLUCOSE, CAPILLARY
Glucose-Capillary: 205 mg/dL — ABNORMAL HIGH (ref 70–99)
Glucose-Capillary: 152 mg/dL — ABNORMAL HIGH (ref 70–99)
Glucose-Capillary: 177 mg/dL — ABNORMAL HIGH (ref 70–99)
Glucose-Capillary: 129 mg/dL — ABNORMAL HIGH (ref 70–99)
Glucose-Capillary: 132 mg/dL — ABNORMAL HIGH (ref 70–99)

## 2019-07-02 LAB — CBC
HCT: 34.3 % — ABNORMAL LOW (ref 39.0–52.0)
Hemoglobin: 11.4 g/dL — ABNORMAL LOW (ref 13.0–17.0)
MCH: 30.7 pg (ref 26.0–34.0)
MCHC: 33.2 g/dL (ref 30.0–36.0)
MCV: 92.5 fL (ref 80.0–100.0)
Platelets: 233 10*3/uL (ref 150–400)
RBC: 3.71 MIL/uL — ABNORMAL LOW (ref 4.22–5.81)
RDW: 12 % (ref 11.5–15.5)
WBC: 5.7 10*3/uL (ref 4.0–10.5)
nRBC: 0 % (ref 0.0–0.2)

## 2019-07-02 LAB — PROTIME-INR
INR: 1.2 (ref 0.8–1.2)
Prothrombin Time: 14.8 seconds (ref 11.4–15.2)

## 2019-07-02 MED ORDER — INSULIN GLARGINE 100 UNIT/ML ~~LOC~~ SOLN
33.0000 [IU] | Freq: Every day | SUBCUTANEOUS | Status: DC
  Administered 2019-07-03 – 2019-07-17 (×13): 33 [IU] via SUBCUTANEOUS
  Filled 2019-07-02 (×15): qty 0.33

## 2019-07-02 MED ORDER — GLYCOPYRROLATE 1 MG PO TABS
2.0000 mg | ORAL_TABLET | Freq: Three times a day (TID) | ORAL | Status: DC
  Administered 2019-07-02 – 2019-07-17 (×43): 2 mg
  Filled 2019-07-02 (×46): qty 2

## 2019-07-02 MED ORDER — INSULIN GLARGINE 100 UNIT/ML ~~LOC~~ SOLN: 33.0000 [IU] | Freq: Every day | SUBCUTANEOUS | Status: DC

## 2019-07-02 NOTE — Progress Notes (Addendum)
At 2353, bladder scan=772ml, I & O cath=760ml. Patient rested quietly until 0230. Patient yelling, "help me" and counting repetitively. Unable to reorient patient after multi attempts. "Where's my 60 dollars?"  Agitated, pulling at [facility] and [place], bilateral mittens applied. Currently requiring 1:1 supervision. Patrici Ranks A

## 2019-07-02 NOTE — Progress Notes (Signed)
Seemed to snap out of his confusion. "I'm sorry". Will leave mittens on for now. Michael Bright A

## 2019-07-02 NOTE — Progress Notes (Signed)
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 Recent Labs    07/02/19 0420  WBC 5.7  HGB 11.4*  HCT 34.3*  PLT 233   No results for input(s): NA, K, CL, CO2, GLUCOSE, BUN, CREATININE, CALCIUM in the last 72 hours.  Intake/Output Summary (Last 24 hours) at 07/02/2019 0914 Last data filed at 07/02/2019 0530 Gross per 24 hour  Intake 10 ml  Output 1475 ml  Net -1465 ml     Physical Exam: Vital Signs Blood pressure 128/78, pulse 90, temperature 98 F (36.7 C), temperature source Oral, resp. rate 17, height 6\' 1"  (1.854 m), weight (!) 146.3 kg, SpO2 94 %. Constitutional: No distress . Vital signs reviewed. HEENT: EOMI, oral membranes moist Neck: #4 trach with tan secretions present. No odor.  Cardiovascular: RRR without murmur. No JVD    Respiratory: a few scattered wheezes and rhonchi.  GI: BS +, non-tender, non-distended  Musculoskeletal:  General: No tendernessor edema.  Neurological: alert and oriented to person, hospital, month. Leans to left  Fair insight this morning remembers waking up with SOB last noc .  Decreased LT left hemi-face, shoulder and leg. Decreased FMC LUE and LLE ongoing.   Skin: He isnot diaphoretic. Psych: cooperative but flat      Assessment/Plan: 1. 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  Physiatrist is providing close team supervision and 24 hour management of active medical problems listed below.  Physiatrist and rehab team continue to assess barriers to discharge/monitor patient  progress toward functional and medical goals  Care Tool:  Bathing    Body parts bathed by patient: Right arm, Left arm, Abdomen, Chest, Right upper leg, Left upper leg, Right lower leg, Left lower leg, Face, Front perineal area, Buttocks   Body parts bathed by helper: Buttocks     Bathing assist Assist Level: Minimal Assistance - Patient > 75%     Upper Body Dressing/Undressing Upper body dressing   What is the patient wearing?: Pull over shirt    Upper body assist Assist Level: Supervision/Verbal cueing    Lower Body Dressing/Undressing Lower body dressing      What is the patient wearing?: Pants, Underwear/pull up     Lower body assist Assist for lower body dressing: Minimal Assistance - Patient > 75%     Toileting Toileting    Toileting assist Assist for toileting: Moderate Assistance - Patient 50 - 74%     Transfers Chair/bed transfer  Transfers assist  Chair/bed transfer activity did not occur: Safety/medical concerns  Chair/bed transfer assist level: Minimal Assistance - Patient > 75%     Locomotion Ambulation   Ambulation assist   Ambulation activity did not occur: Safety/medical concerns  Assist level: Minimal Assistance - Patient > 75% Assistive device: Walker-rolling Max distance: 47ft   Walk 10 feet activity   Assist  Walk 10 feet activity did not occur: Safety/medical concerns  Assist level: Minimal Assistance - Patient > 75% Assistive device: Walker-rolling   Walk 50 feet activity   Assist Walk 50 feet with 2 turns activity did not occur: Safety/medical concerns  Walk 150 feet activity   Assist Walk 150 feet activity did not occur: Safety/medical concerns         Walk 10 feet on uneven surface  activity   Assist Walk 10 feet on uneven surfaces activity did not occur: Safety/medical concerns         Wheelchair     Assist Will patient use wheelchair at discharge?: (TBD) Type of Wheelchair: Manual     Wheelchair assist level: Supervision/Verbal cueing Max wheelchair distance: 10 ft    Wheelchair 50 feet with 2 turns activity    Assist    Wheelchair 50 feet with 2 turns activity did not occur: Safety/medical concerns       Wheelchair 150 feet activity     Assist          Blood pressure 128/78, pulse 90, temperature 98 F (36.7 C), temperature source Oral, resp. rate 17, height 6\' 1"  (1.854 m), weight (!) 146.3 kg, SpO2 94 %.  Medical Problem List and Plan: 1. Reduced Mobility, swllow function and self care secondary to left lateral medullary infarct, Wallenberg type syndrome, course complicated by multiple medical issues and prolonged hospital stay  --Continue CIR therapies including PT, OT, and SLP  2. Antithrombotics: -DVT/anticoagulation:Pharmaceutical:Lovenox -antiplatelet therapy: ASA. Plavix on hold for PEG  3. Pain Management:n/A 4. Mood/delirium: although improved from acute has been an ongoing issue -antipsychotic agents: continues to demonstrate HS confusion. Will increase seroquel and adjust timing to 2100. Have stopped am dose Agitation confusion not typical of Wallenberg, may have excess anticholinergics on board, secreations increaed after d/c scop patch increase glycopyrrolate to TID - also should improve with d/c of NG tube once PEG is placed  Pt's agitation cleared up spontaneously about 330am (per RN note "snapped out of it"    -ua negative, ucx pending, other labs and images ok 5. Neuropsych: This patientis not fullycapable of making decisions onhisown behalf. 6. Skin/Wound Care:Routine pressure relief measures. 7. Fluids/Electrolytes/Nutrition:NPO with tube feeds for now   follow up labs next week.   8. T2DM: Hgb A1c-10.6. Now on lantus with SSI for elevated BS. Continue to monitor BS every 4 hours.   -improving control. Increase lantus to 33u CBG (last 3)  Recent Labs    07/01/19 2030 07/01/19 2304  07/02/19 0333  GLUCAP 97 97 205*   9. Recurrent PNA--H flu on 9/7: Has been on prolonged antibiotics--narrowed to rocephin on 9/8--ends 9/12  -afebrile currently  -9/10 CXR reviewed and without acute disease. 10. Respiratory failure s/p trach: Continue 28 % ATC.   -PMV during day  -Scopolamine patch/ robinul to help manage secretions===has been effective  -downsized to #4 trach 9/10. Should help some irritation of airway.   -remove NGT when PEG placed  -has significant OSA. Will need CPAP when decannulated. May still be able to decannulate prior discharge but would like to see secretions a bit better first.  11. Tachycardia: HR better, in 90's now.   12. Severe dysphagia: severe pharyngeal dysphagia on MBS yesterday  -appreciate IR follow up---PEG placement Monday as we hold plavix- will hold lovenox as well   -Continue TF for now with strict aspiration precautions. 13. Brief unresponsive episodes none repeorted fo r24 h  ?seizures vs vasovagal events related to suctioning  -EEG performed and does not demonstrate seizure activity  -hold on anticonvulsant at this time   -EKG reviewed and NSR    14.  Add celexa for anxiety avoiding benzos due to resp drive  concerns  LOS: 5 days A FACE TO FACE EVALUATION WAS PERFORMED  Erick Colace 07/02/2019, 9:14 AM

## 2019-07-02 NOTE — Progress Notes (Signed)
Occupational Therapy Session Note  Patient Details  Name: Michael Bright MRN: [id number] Date of Birth: Sep 10, 1956  Today's Date: 07/02/2019 OT Individual Time: 4627-0350 OT Individual Time Calculation (min): 55 min    Short Term Goals: Week 1:  OT Short Term Goal 1 (Week 1): Pt will complete 1 grooming task in standing with HR > 120 bpm OT Short Term Goal 2 (Week 1): Pt will don shorts with min A OT Short Term Goal 3 (Week 1): Pt will complete toileting tasks with min A OT Short Term Goal 4 (Week 1): Pt will demonstrate improved safety awareness, requiring no more than min cueing during ADLs  Skilled Therapeutic Interventions/Progress Updates:    Pt received supine in bed, no c/o pain. Pt slightly remembers events of last night and confusion, but is now fully oriented. Pt transitioned to EOB with PMSV applied, SpO2 dropping to 87%. Pt able to rebound to 94% with cueing for rest break/breathing technique. Pt completed LB bathing sit <> stand from EOB with CGA. Pt required min A for balance stabilization with unilateral support on RW. Pt's Spo2 remained > 90% with PMSV applied throughout rest of session. Pt able to don shorts with CGA. Pt completed short distance functional mobility to the sink using RW with CGA. Pt sat in w/c and completed oral care at sink- good adherence to NPO status. Pt completed UB bathing and donned shirt with assistance for line management only. Pt began several coughing fits with copious amount of secretions exiting trach and some being coughed up orally. Pt edu on importance of practicing coughing up secretions orally to move toward decannulation goal eventually. Pt required extended rest break d/t secretions. Pt was left sitting up in the w/c with PMSV removed, 5L, 28% FiO2 on via trach. Cortrak in place and running. Chair alarm set.   Therapy Documentation Precautions:  Precautions Precautions: Fall, Other (comment) Precaution Comments: NG tube, watch BP, HR & O2,  PMV with therapies, trach collar Restrictions Weight Bearing Restrictions: No   Therapy/Group: Individual Therapy  Curtis Sites 07/02/2019, 7:14 AM

## 2019-07-03 ENCOUNTER — Inpatient Hospital Stay (HOSPITAL_COMMUNITY): Payer: Medicare Other | Admitting: Speech Pathology

## 2019-07-03 ENCOUNTER — Other Ambulatory Visit: Payer: Self-pay

## 2019-07-03 ENCOUNTER — Inpatient Hospital Stay (HOSPITAL_COMMUNITY): Payer: Medicare Other

## 2019-07-03 ENCOUNTER — Inpatient Hospital Stay (HOSPITAL_COMMUNITY): Payer: BC Managed Care – PPO

## 2019-07-03 LAB — BASIC METABOLIC PANEL
Anion gap: 7 (ref 5–15)
BUN: 15 mg/dL (ref 8–23)
CO2: 25 mmol/L (ref 22–32)
Calcium: 8.4 mg/dL — ABNORMAL LOW (ref 8.9–10.3)
Chloride: 104 mmol/L (ref 98–111)
Creatinine, Ser: 0.78 mg/dL (ref 0.61–1.24)
GFR calc Af Amer: >60 mL/min (ref >60)
GFR calc non Af Amer: >60 mL/min (ref >60)
Glucose, Bld: 130 mg/dL — ABNORMAL HIGH (ref 70–99)
Potassium: 3.9 mmol/L (ref 3.5–5.1)
Sodium: 136 mmol/L (ref 135–145)

## 2019-07-03 LAB — CBC
HCT: 34.9 % — ABNORMAL LOW (ref 39.0–52.0)
Hemoglobin: 11.3 g/dL — ABNORMAL LOW (ref 13.0–17.0)
MCH: 30.3 pg (ref 26.0–34.0)
MCHC: 32.4 g/dL (ref 30.0–36.0)
MCV: 93.6 fL (ref 80.0–100.0)
Platelets: 227 10*3/uL (ref 150–400)
RBC: 3.73 MIL/uL — ABNORMAL LOW (ref 4.22–5.81)
RDW: 11.9 % (ref 11.5–15.5)
WBC: 6 10*3/uL (ref 4.0–10.5)
nRBC: 0 % (ref 0.0–0.2)

## 2019-07-03 LAB — GLUCOSE, CAPILLARY
Glucose-Capillary: 114 mg/dL — ABNORMAL HIGH (ref 70–99)
Glucose-Capillary: 115 mg/dL — ABNORMAL HIGH (ref 70–99)
Glucose-Capillary: 123 mg/dL — ABNORMAL HIGH (ref 70–99)
Glucose-Capillary: 152 mg/dL — ABNORMAL HIGH (ref 70–99)
Glucose-Capillary: 73 mg/dL (ref 70–99)
Glucose-Capillary: 76 mg/dL (ref 70–99)

## 2019-07-03 MED ORDER — CHLORHEXIDINE GLUCONATE 0.12 % MT SOLN
OROMUCOSAL | Status: AC
  Administered 2019-07-03: 15 mL via OROMUCOSAL
  Filled 2019-07-03: qty 15

## 2019-07-03 MED ORDER — ENOXAPARIN SODIUM 80 MG/0.8ML ~~LOC~~ SOLN
70.0000 mg | SUBCUTANEOUS | Status: DC
  Administered 2019-07-05 – 2019-07-16 (×12): 70 mg via SUBCUTANEOUS
  Filled 2019-07-03 (×14): qty 0.7

## 2019-07-03 MED ORDER — CLOPIDOGREL BISULFATE 75 MG PO TABS
75.0000 mg | ORAL_TABLET | Freq: Every day | ORAL | Status: DC
  Administered 2019-07-06 – 2019-07-17 (×12): 75 mg
  Filled 2019-07-03 (×12): qty 1

## 2019-07-03 NOTE — Progress Notes (Signed)
Occupational Therapy Session Note  Patient Details  Name: Michael Bright MRN: [id number] Date of Birth: [birth year]  Today's Date: 07/03/2019 OT Missed Time: 20 Minutes  Pt anxiously awaiting transport to OR for peg placement. Pt reporting he has not had tube feeds all day and is exhausted, requesting to cancel OT session. Pt encouraged to participate s/p placement tomorrow.    Curtis Sites 07/03/2019, 7:17 AM

## 2019-07-03 NOTE — Progress Notes (Signed)
Smiling when entered room at 1900, "I'm sorry for giving you a hard time last night." No confused episodes thus far tonight. Osmolite stopped at midnight per orders. Coughing up moderate amount of tan, thick secretions form trach. No deep suction required, strong cough. O2 sats remaining in mid to upper 90's. Denies pain or SOB.  At 2145, no void, bladder scan=667ml, I & O cath=770ml. At 0350, no void, bladder scan=358ml, I & O cath=322ml. Patrici Ranks A

## 2019-07-03 NOTE — Progress Notes (Signed)
Physical Therapy Session Note  Patient Details  Name: Michael Bright MRN: [id number] Date of Birth: 1956/10/13  Today's Date: 07/03/2019 PT Individual Time: 0800-0905 PT Individual Time Calculation (min): 65 min   Short Term Goals: Week 1:  PT Short Term Goal 1 (Week 1): Pt will ambulate 50 ft with LRAD & CGA. PT Short Term Goal 2 (Week 1): Pt will complete bed mobility without hospital bed features with CGA. PT Short Term Goal 3 (Week 1): Pt will complete all transfers with CGA & LRAD. PT Short Term Goal 4 (Week 1): Pt will negotiate 4 steps with B rails & min assist for strengthening.  Skilled Therapeutic Interventions/Progress Updates:   Pt presents in bed and agreeable to therapy session. Does state he thinks he feels a little out of it from the sleeping medication he took last night but otherwise slept well. Pt able to come to EOB with CGA with cues for technique and uses bedrail for support. Pt assisted with management of tubing for safety. EOB addressed functional dynamic sitting balance, sitting/upright tolerance and endurance, and postural control retraining. Pt does have tendency for mild L lateral lean and LOB but able to correct and is aware of this even when in supported sitting. Pt coughing up productive secretions and PT provided suction. Pt able to don pants and underwear seated EOB with overall min assist and sit <> stands x 2 reps with min assist with cues for hand placement and technique for anterior weightshift and facilitation of movement. Pt able to pull up R side of pants with PT assisting in back with L side, due to limited standing tolerance due to fatigue. O2 and HR remained WFL. Stand step transfer with RW to w/c with overall min assist and mild L lateral lean. Transported to therapy gym for time management. Focused on gait training with RW for functional mobility training, endurance, strengthening, and postural control retraining with overall min assist x 40' with +2 for  w/c follow and to manage O2 tank. Pt with initial O2 = 96% and HR = 120 bpm after gait and reports being "exhausted." Rest break given. During gait and turning, pt with narrow BOS, slightly decreased coordination of LLE placement. After seated break, then engaged in blocked practice sit <> stands with focus on controlled movement and technique with overall CGA to min assist x 4 reps. Transferred back to w/c with RW and stand step technique demonstrating improved overall balance. End of session reattached to wall O2 via trach collar and O2 monitor. All needs in reach and wife present.  Therapy Documentation Precautions:  Precautions Precautions: Fall, Other (comment) Precaution Comments: NG tube, watch BP, HR & O2, PMV with therapies, trach collar Restrictions Weight Bearing Restrictions: No   Vital Signs: Oxygen Therapy SpO2: 92 % O2 Device: Tracheostomy Collar FiO2 (%): 28 %  O2 = 92 -97% with activity HR = 100-120 bpm with activity Pain:  Denies pain.  Therapy/Group: Individual Therapy  Canary Brim Ivory Broad, PT, DPT, CBIS  07/03/2019, 9:09 AM

## 2019-07-03 NOTE — Progress Notes (Signed)
Speech Language Pathology Daily Session Note  Patient Details  Name: Michael Bright MRN: [id number] Date of Birth: 1956/02/04  Today's Date: 07/03/2019 SLP Individual Time: 7564-3329 SLP Individual Time Calculation (min): 40 min  Short Term Goals: Week 1: SLP Short Term Goal 1 (Week 1): Pt will demonstrate tolerance of PMSV in 45 minute intervals with vitals signs remaining WFL. SLP Short Term Goal 2 (Week 1): Pt will preform throat clear and swallow to reduce vocal qaulity wetness with mod A verbal cues. SLP Short Term Goal 3 (Week 1): Pt will consume ice chip trials to preserve swallow function with moderate s/s aspiration. SLP Short Term Goal 4 (Week 1): Pt will complete semi-complex problem solving tasks with min A verbal cues. SLP Short Term Goal 5 (Week 1): Pt will self-awareness and self-correct of functional errors in semi-complex problem solving tasks with min A verbal cues. SLP Short Term Goal 6 (Week 1): Pt will utilizie memory compensatory strategies to recall new, daily information with min A verbal cues.  Skilled Therapeutic Interventions: Skilled treatment session focused on speech and dysphagia goals. Upon arrival, patient was upright in the wheelchair with wife present and copious amounts of secretions expelling from his trach hub. PMSV was donned with all vitals remaining WFL throughout the session. However, PMSV was removed ~2-3 times in order for secretions to be expelled from trach hub due to patient's inability to orally expectorate secretions. Trials of ice chips were not attempted today due to planned PEG procedure. Therefore, patient performed 10 repetitions of base of tongue strengthening exercises and effortful swallows. Patient's wife present and educated in regards to patient's current speech and swallowing function, PMSV usage and goals of skilled SLP intervention. She verbalized understanding and all questions were answered. Patient left upright in the wheelchair with  wife present. Continue with current plan of care.      Pain Pain Assessment Pain Scale: 0-10 Pain Score: 0-No pain Faces Pain Scale: No hurt  Therapy/Group: Individual Therapy  Aristea Posada 07/03/2019, 12:18 PM

## 2019-07-03 NOTE — Progress Notes (Signed)
[facility] PHYSICAL MEDICINE & REHABILITATION PROGRESS NOTE   Subjective/Complaints:  States he had a much better night. "actually slept".   ROS: Patient denies fever, rash, sore throat, blurred vision, nausea, vomiting, diarrhea, shortness of breath or chest pain, joint or back pain, headache, or mood change.    Objective:   No results found. Recent Labs    07/02/19 0420 07/03/19 0428  WBC 5.7 6.0  HGB 11.4* 11.3*  HCT 34.3* 34.9*  PLT 233 227   Recent Labs    07/03/19 0428  NA 136  K 3.9  CL 104  CO2 25  GLUCOSE 130*  BUN 15  CREATININE 0.78  CALCIUM 8.4*    Intake/Output Summary (Last 24 hours) at 07/03/2019 1058 Last data filed at 07/03/2019 0350 Gross per 24 hour  Intake 435 ml  Output 1050 ml  Net -615 ml     Physical Exam: Vital Signs Blood pressure 129/72, pulse 82, temperature 98.3 F (36.8 C), temperature source Oral, resp. rate 18, height 6\' 1"  (1.854 m), weight (!) 146.2 kg, SpO2 97 %. Constitutional: No distress . Vital signs reviewed. HEENT: EOMI, oral membranes moist Neck: supple, tan secretions around #4 trach Cardiovascular: RRR without murmur. No JVD    Respiratory: CTA Bilaterally without wheezes or rales. Normal effort    GI: BS +, non-tender, non-distended  Musculoskeletal:  General: No tendernessor edema.  Neurological: alert and oriented to person, hospital, month. Engages me quickly. Wet voice. Remembers me.  Decreased LT left hemi-face, shoulder and leg. Decreased FMC LUE and LLE ongoing.   Skin: He isnot diaphoretic. Psych: cooperative but flat      Assessment/Plan: 1. 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  Physiatrist is providing close team supervision and 24 hour management of active medical problems listed below.  Physiatrist and rehab team continue to assess barriers to discharge/monitor  patient progress toward functional and medical goals  Care Tool:  Bathing    Body parts bathed by patient: Right arm, Left arm, Abdomen, Chest, Right upper leg, Left upper leg, Right lower leg, Left lower leg, Face, Front perineal area, Buttocks   Body parts bathed by helper: Buttocks     Bathing assist Assist Level: Contact Guard/Touching assist     Upper Body Dressing/Undressing Upper body dressing   What is the patient wearing?: Pull over shirt    Upper body assist Assist Level: Supervision/Verbal cueing    Lower Body Dressing/Undressing Lower body dressing      What is the patient wearing?: Pants, Underwear/pull up     Lower body assist Assist for lower body dressing: Minimal Assistance - Patient > 75%     Toileting Toileting    Toileting assist Assist for toileting: Moderate Assistance - Patient 50 - 74%     Transfers Chair/bed transfer  Transfers assist  Chair/bed transfer activity did not occur: Safety/medical concerns  Chair/bed transfer assist level: Minimal Assistance - Patient > 75%     Locomotion Ambulation   Ambulation assist   Ambulation activity did not occur: Safety/medical concerns  Assist level: 2 helpers(min A but +2 needed for w/c follow and O2 tank management) Assistive device: Walker-rolling Max distance: 40'   Walk 10 feet activity   Assist  Walk 10 feet activity did not occur: Safety/medical concerns  Assist level: Minimal Assistance - Patient > 75% Assistive device: Walker-rolling   Walk 50 feet activity   Assist Walk 50  feet with 2 turns activity did not occur: Safety/medical concerns         Walk 150 feet activity   Assist Walk 150 feet activity did not occur: Safety/medical concerns         Walk 10 feet on uneven surface  activity   Assist Walk 10 feet on uneven surfaces activity did not occur: Safety/medical concerns         Wheelchair     Assist Will patient use wheelchair at discharge?:  (TBD) Type of Wheelchair: Manual    Wheelchair assist level: Supervision/Verbal cueing Max wheelchair distance: 10 ft    Wheelchair 50 feet with 2 turns activity    Assist    Wheelchair 50 feet with 2 turns activity did not occur: Safety/medical concerns       Wheelchair 150 feet activity     Assist          Blood pressure 129/72, pulse 82, temperature 98.3 F (36.8 C), temperature source Oral, resp. rate 18, height 6\' 1"  (1.854 m), weight (!) 146.2 kg, SpO2 97 %.  Medical Problem List and Plan: 1. Reduced Mobility, swllow function and self care secondary to left lateral medullary infarct, Wallenberg type syndrome, course complicated by multiple medical issues and prolonged hospital stay  --Continue CIR therapies including PT, OT, and SLP  2. Antithrombotics: -DVT/anticoagulation:Pharmaceutical:Lovenox -antiplatelet therapy: ASA. Plavix on hold for PEG  3. Pain Management:n/A 4. Mood/delirium: improved last night. Hopefully the beginning of trend -antipsychotic agents: continues to demonstrate HS confusion. Will increase seroquel and adjust timing to 2100. Have stopped am dose   -ua negative, ucx pending, other labs and images ok 5. Neuropsych: This patientis not fullycapable of making decisions onhisown behalf. 6. Skin/Wound Care:Routine pressure relief measures. 7. Fluids/Electrolytes/Nutrition:NPO with tube feeds for now   follow up labs next week.   8. T2DM: Hgb A1c-10.6. Now on lantus with SSI for elevated BS. Continue to monitor BS every 4 hours.   -improving control. Increased lantus to 33u CBG (last 3)  Recent Labs    07/03/19 0017 07/03/19 0408 07/03/19 0835  GLUCAP 152* 123* 114*   9. Recurrent PNA--H flu on 9/7: Has been on prolonged antibiotics--narrowed to rocephin on 9/8--ends 9/12  -afebrile currently  -9/10 CXR reviewed and without acute disease. 10. Respiratory failure s/p trach: Continue 28 % ATC.    -PMV during day  -TID robinul to help manage secretions===has been effective  -downsized to #4 trach 9/10. Should help some irritation of airway.   -remove NGT when PEG placed and usable  -has significant OSA. Will need CPAP when decannulated. May still be able to decannulate prior discharge but would like to see secretions a bit better first.  11. Tachycardia: HR better, in 90's now.   12. Severe dysphagia: severe pharyngeal dysphagia on MBS yesterday  -appreciate IR follow up---PEG placement today  -resume TF tomorrow with strict aspiration precautions.  -convert to bolus schedule 13. Brief unresponsive episodes none repeorted fo r24 h  ?seizures vs vasovagal events related to suctioning  -EEG performed and does not demonstrate seizure activity  -hold on anticonvulsant at this time   -EKG reviewed and NSR    14.  Add celexa for anxiety avoiding benzos due to resp drive  concerns    LOS: 6 days A FACE TO FACE EVALUATION WAS PERFORMED  Michael Bright 07/03/2019, 10:58 AM

## 2019-07-04 ENCOUNTER — Inpatient Hospital Stay (HOSPITAL_COMMUNITY): Payer: BC Managed Care – PPO

## 2019-07-04 ENCOUNTER — Inpatient Hospital Stay (HOSPITAL_COMMUNITY): Payer: Medicare Other

## 2019-07-04 ENCOUNTER — Encounter (HOSPITAL_COMMUNITY): Payer: Self-pay | Admitting: Interventional Radiology

## 2019-07-04 ENCOUNTER — Inpatient Hospital Stay (HOSPITAL_COMMUNITY): Payer: Medicare Other | Admitting: Speech Pathology

## 2019-07-04 HISTORY — PX: IR GASTROSTOMY TUBE MOD SED: IMG625

## 2019-07-04 LAB — GLUCOSE, CAPILLARY
Glucose-Capillary: 139 mg/dL — ABNORMAL HIGH (ref 70–99)
Glucose-Capillary: 69 mg/dL — ABNORMAL LOW (ref 70–99)
Glucose-Capillary: 75 mg/dL (ref 70–99)
Glucose-Capillary: 77 mg/dL (ref 70–99)
Glucose-Capillary: 80 mg/dL (ref 70–99)
Glucose-Capillary: 86 mg/dL (ref 70–99)
Glucose-Capillary: 96 mg/dL (ref 70–99)

## 2019-07-04 MED ORDER — FENTANYL CITRATE (PF) 100 MCG/2ML IJ SOLN
INTRAMUSCULAR | Status: AC | PRN
  Administered 2019-07-04: 50 ug via INTRAVENOUS

## 2019-07-04 MED ORDER — GLUCAGON HCL RDNA (DIAGNOSTIC) 1 MG IJ SOLR
INTRAMUSCULAR | Status: AC
  Filled 2019-07-04: qty 1

## 2019-07-04 MED ORDER — MIDAZOLAM HCL 2 MG/2ML IJ SOLN
INTRAMUSCULAR | Status: AC | PRN
  Administered 2019-07-04: 1 mg via INTRAVENOUS

## 2019-07-04 MED ORDER — GLUCAGON HCL RDNA (DIAGNOSTIC) 1 MG IJ SOLR
INTRAMUSCULAR | Status: AC | PRN
  Administered 2019-07-04: 1 mg via INTRAVENOUS

## 2019-07-04 MED ORDER — IOHEXOL 300 MG/ML  SOLN
50.0000 mL | Freq: Once | INTRAMUSCULAR | Status: AC | PRN
  Administered 2019-07-04: 20 mL

## 2019-07-04 MED ORDER — DEXTROSE 50 % IV SOLN
INTRAVENOUS | Status: AC
  Filled 2019-07-04: qty 50

## 2019-07-04 MED ORDER — DEXTROSE-NACL 5-0.45 % IV SOLN
INTRAVENOUS | Status: DC
  Administered 2019-07-04 – 2019-07-05 (×3): via INTRAVENOUS

## 2019-07-04 MED ORDER — LIDOCAINE HCL 1 % IJ SOLN
INTRAMUSCULAR | Status: AC
  Filled 2019-07-04: qty 20

## 2019-07-04 MED ORDER — LIDOCAINE HCL (PF) 1 % IJ SOLN
INTRAMUSCULAR | Status: AC | PRN
  Administered 2019-07-04: 20 mL

## 2019-07-04 MED ORDER — MIDAZOLAM HCL 2 MG/2ML IJ SOLN
INTRAMUSCULAR | Status: AC
  Filled 2019-07-04: qty 2

## 2019-07-04 MED ORDER — CEFAZOLIN SODIUM-DEXTROSE 2-4 GM/100ML-% IV SOLN
2.0000 g | Freq: Once | INTRAVENOUS | Status: AC
  Administered 2019-07-04: 16:00:00 2 g via INTRAVENOUS

## 2019-07-04 MED ORDER — OSMOLITE 1.5 CAL PO LIQD: 474.0000 mL | Freq: Three times a day (TID) | ORAL | Status: DC

## 2019-07-04 MED ORDER — CEFAZOLIN SODIUM-DEXTROSE 2-4 GM/100ML-% IV SOLN
INTRAVENOUS | Status: AC
  Administered 2019-07-04: 2 g via INTRAVENOUS
  Filled 2019-07-04: qty 100

## 2019-07-04 MED ORDER — FENTANYL CITRATE (PF) 100 MCG/2ML IJ SOLN
INTRAMUSCULAR | Status: AC
  Filled 2019-07-04: qty 2

## 2019-07-04 MED ORDER — PRO-STAT SUGAR FREE PO LIQD: 30.0000 mL | Freq: Every day | ORAL | Status: DC

## 2019-07-04 NOTE — Progress Notes (Signed)
Social Work Patient ID: Michael Bright, male   DOB: 30-May-1956, 63 y.o.   MRN: [id number]   Have reviewed team conference with pt and wife.  Both aware and agreeable with targeted d/c date of 9/26 and supervision goals overall.  Pt very frustrated with long delay since yesterday with peg placement - should be done this afternoon.  Both aware that MD is uncertain at this point if trach will be out by d/c due to extent of secretions and wife "a little nervous about that."  Both are pleased with his physical gains. Plan to check in with them again end of week.  Rashi Granier, LCSW

## 2019-07-04 NOTE — Procedures (Signed)
Interventional Radiology Procedure Note  Procedure: Placement of percutaneous 20F pull-through gastrostomy tube. Complications: None Recommendations: - NPO except for sips and chips remainder of today and overnight - Maintain G-tube to LWS until tomorrow morning  - May advance diet as tolerated and begin using tube tomorrow morning  Signed,  Mikayla Chiusano K. Denzel Etienne, MD   

## 2019-07-04 NOTE — Progress Notes (Signed)
Pt off unit receiving peg placement. RT will follow up on trach care when pt returns.

## 2019-07-04 NOTE — Progress Notes (Signed)
Nutrition Follow-up  RD working remotely.  DOCUMENTATION CODES:   Morbid obesity  INTERVENTION:   Once PEG cleared for use, initiate bolus TF regimen: - For first bolus, provide 100 ml of Osmolite 1.5 cal formula. Increase volume by 100 ml each subsequent bolus until goal volume of 474 ml administered TID. - Pro-stat 30 ml daily - Free water per MD/PA  Bolus tube feeding regimen at goal provides 2233 kcal, 104 grams of protein, and 1084 ml of H2O.   NUTRITION DIAGNOSIS:   Inadequate oral intake related to dysphagia as evidenced by NPO status.  Ongoing, being addressed via TF  GOAL:   Patient will meet greater than or equal to 90% of their needs  Met via TF  MONITOR:   Labs, Weight trends, TF tolerance, I & O's  REASON FOR ASSESSMENT:   Consult Enteral/tube feeding initiation and management  ASSESSMENT:   63 year old male with PMH of T2DM, morbid obesity. Pt was admitted to [facility] on 05/30/19 with two day history of left facial weakness with numbness, left gaze preference, LLE weakness with numbness, and difficulty swallowing saliva. 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 Pt had a decline in respiratory status with increase WOB felt to be due aspiration and inability to handle secretions and was transferred to ICU for closer montioring. Pt required intubation 8/15. MSSA PNA treated with cefazolin. Trach placed on 8/21 and transitioned to ATC by 8/23. Pt has been NPO and was maintained on tube feeds for nutritional support. Pt has been tolerating PMSV and MBS done showing frank aspiration of all thins before, during, and after swallow with consistent cough response. Pt admitted to CIR on 9/08.  9/10 - trach downsized to #4 Shiley cuffless  Noted plan for PEG placement today.  Weight up 10 lbs since admission to CIR. Question accuracy of weight given short timeframe. Will continue to monitor trends.  Per RN  edema assessment, pt with mild pitting edema to BLE.  Medications reviewed and include: Pepcid, SSI q 4 hours, Lantus 33 units daily IVF: D5 in 1/2NS @ 75 ml/hr  Labs reviewed. CBG's: 69-96 x 24 hours  UOP: 1600 ml x 24 hours  Diet Order:   Diet Order            Diet NPO time specified  Diet effective now              EDUCATION NEEDS:   No education needs have been identified at this time  Skin:  Skin Assessment: Reviewed RN Assessment (MASD to abdomen, groin)  Last BM:  07/01/19  Height:   Ht Readings from Last 1 Encounters:  06/30/19 '6\' 1"'$  (1.854 m)    Weight:   Wt Readings from Last 1 Encounters:  07/04/19 (!) 148.2 kg    Ideal Body Weight:  83.6 kg  BMI:  Body mass index is 43.11 kg/m.  Estimated Nutritional Needs:   Kcal:  2100-2300  Protein:  105-120 grams  Fluid:  2.0 L    Gaynell Face, MS, RD, LDN Inpatient Clinical Dietitian Pager: [phone] Weekend/After Hours: [phone]

## 2019-07-04 NOTE — Progress Notes (Signed)
Speech Language Pathology Weekly Progress and Session Note  Patient Details  Name: Michael Bright MRN: [id number] Date of Birth: Jul 19, 1956  Beginning of progress report period: June 27, 2019 End of progress report period: July 04, 2019  Today's Date: 07/04/2019 SLP Individual Time: 2831-5176 SLP Individual Time Calculation (min): 55 min  Short Term Goals: Week 1: SLP Short Term Goal 1 (Week 1): Pt will demonstrate tolerance of PMSV in 45 minute intervals with vitals signs remaining WFL. SLP Short Term Goal 1 - Progress (Week 1): Met SLP Short Term Goal 2 (Week 1): Pt will preform throat clear and swallow to reduce vocal qaulity wetness with mod A verbal cues. SLP Short Term Goal 2 - Progress (Week 1): Met SLP Short Term Goal 3 (Week 1): Pt will consume ice chip trials to preserve swallow function with moderate s/s aspiration. SLP Short Term Goal 3 - Progress (Week 1): Not met SLP Short Term Goal 4 (Week 1): Pt will complete semi-complex problem solving tasks with min A verbal cues. SLP Short Term Goal 4 - Progress (Week 1): Not met SLP Short Term Goal 5 (Week 1): Pt will self-awareness and self-correct of functional errors in semi-complex problem solving tasks with min A verbal cues. SLP Short Term Goal 5 - Progress (Week 1): Not met SLP Short Term Goal 6 (Week 1): Pt will utilizie memory compensatory strategies to recall new, daily information with min A verbal cues. SLP Short Term Goal 6 - Progress (Week 1): Not met    New Short Term Goals: Week 2: SLP Short Term Goal 1 (Week 2): Pt will demonstrate tolerance of PMSV in 60 minute intervals with vitals signs remaining WFL. SLP Short Term Goal 2 (Week 2): Pt will preform throat clear and swallow to reduce vocal qaulity wetness with min A verbal cues. SLP Short Term Goal 3 (Week 2): Pt will consume ice chip trials to preserve swallow function with moderate s/s aspiration with overall supervision level verbal cues. SLP Short  Term Goal 4 (Week 2): Pt will complete semi-complex problem solving tasks with min A verbal cues. SLP Short Term Goal 5 (Week 2): Pt will utilizie memory compensatory strategies to recall new, daily information with min A verbal cues.  Weekly Progress Updates: Patient has made functional gains and has met 2 of 6 STGs this reporting period. Currently, patient remains NPO with continued severe dysphagia with focus on pharyngeal strengthening exercises and management of secretions. Patient with minimal trials of ice chips due to discomfort and prolonged coughing episodes and time to recover.  Patient with tolerating his PMSV for ~60 minute increments but requires full supervision for removal of valve to allow for secretions to be expelled from trach hub due to inability for patient to expectorate secretions from the oral cavity. Patient is also 100% intelligible in valve is in place.  Patient continues to demonstrate mild-moderate cognitive deficits and requires moderate cues for problem solving and recall of functional information. Patient and family education ongoing. Patient would benefit from continued skilled SLP intervention to maximize his cognitive, speech and swallowing function prior to discharge.      Intensity: Minumum of 1-2 x/day, 30 to 90 minutes Frequency: 3 to 5 out of 7 days Duration/Length of Stay: 07/15/19 Treatment/Interventions: Cognitive remediation/compensation;Cueing hierarchy;Dysphagia/aspiration precaution training;Functional tasks;Internal/external aids;Patient/family education   Daily Session  Skilled Therapeutic Interventions: Skilled treatment session focused on speech and cognitive goals. Upon arrival, patient was asleep but easily awakened. Patient apparently had vivid dreams last night and thought  he had already had his PEG procedure and needed to be reoriented. Patient also reported he felt week and "foggy," RN aware and blood sugar low (77). RN and MD aware and  addressing. PMSV donned and remained in place for ~55 minutes with all vitals remaining WFL. PMSV removed at end of session due to severe secretions expelling from trach hub. SLP facilitated session by providing Mod-Max A verbal cues for functional problem solving during a basic money management task, however, suspect function impacted by fatigue from low blood sugar. Patient left upright in bed with RN present and all needs within reach. Continue with current plan of care.     Pain No/Denies Pain   Therapy/Group: Individual Therapy  Coltyn Hanning 07/04/2019, 10:30 AM

## 2019-07-04 NOTE — Patient Care Conference (Signed)
Inpatient RehabilitationTeam Conference and Plan of Care Update Date: 07/04/2019   Time: 10:05 AM    Patient Name: Michael Bright      Medical Record Number: [id number]  Date of Birth: 02-22-1956 Sex: Male         Room/Bed: 4W15C/4W15C-01 Payor Info: Payor: BLUE CROSS BLUE SHIELD / Plan: BCBS COMM PPO / Product Type: *No Product type* /    Admit Date/Time:  06/27/2019  4:56 PM  Primary Diagnosis:  Stroke, small vessel [facility](HCC)  Patient Active Problem List   Diagnosis Date Noted  . Stroke, small vessel (HCC) 06/27/2019  . Aspiration pneumonia (HCC) 06/14/2019  . Essential hypertension 06/14/2019  . Type 2 diabetes mellitus with hyperlipidemia (HCC) 06/14/2019  . Acute respiratory failure (HCC)   . Acute CVA (cerebrovascular accident) (HCC) 05/30/2019  . 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 Dysphagia 05/30/2019  . Renal insufficiency 05/30/2019  . Diplopia   . Weakness     Expected Discharge Date: Expected Discharge Date: 07/15/19  Team Members Present: Physician leading conference: Dr. Faith RogueZachary Swartz Social Worker Present: Amada JupiterLucy Rolin Schult, LCSW Nurse Present: Reuben Likesonia Gane, LPN PT Present: Tyrell Antonioarley Pippen, PT OT Present: Jake SharkSandra Davis, OT SLP Present: Feliberto Gottronourtney Payne, SLP PPS Coordinator present : Fae PippinMelissa Bowie, SLP     Current Status/Progress Goal Weekly Team Focus  Bowel/Bladder   Pt is incontinent of bowel. Pt is I&O cath Q6. LBM 07/01/2019.  Encourage timed toileting.  Assist with toileting needs PRN.   Swallow/Nutrition/ Hydration   NPO with PEG (planned placement today)  Mod I for participation in dysphagia treatment  trials of ice chips as tolerated, pharyngeal strengthening exercises   ADL's   CGA ADLs overall, min A transfers, frequent rest breaks, poor secretion management  Supervision ADLs and transfers  ADL retraining, functional activity tolerance, ADL transfers, safety awareness   Mobility   min assist for basic  transfers and gait short distance with RW (+2 for managemnet of O2 tank and w/c follow). Frequent rest breaks due to fatigue and secretions  supervision overall ambulatory household mobility  strengthening, endurance, gait training, upright tolerance, balance retraining   Communication   PMSV with full supervision  Mod I  tolerance of PMSV, donning/doffing valve   Safety/Cognition/ Behavioral Observations  Min-Mod A  Supervision  problem solving, recall and awareness   Pain   Pt complains of generalized pain of 3. Pain managed with Tylenol.  Pain <= 2  Assess pain Q shift and PRN.   Skin   Pt has trach.  Treat skin issues per orders.  Assess skin Q shift and PRN.    Rehab Goals Patient on target to meet rehab goals: Yes *See Care Plan and progress notes for long and short-term goals.     Barriers to Discharge  Current Status/Progress Possible Resolutions Date Resolved   Nursing                  PT  Trach;Nutrition means                 OT                  SLP Trach              SW                Discharge Planning/Teaching Needs:  Pt to d/c home with wife and adult children providing 24/7 assistance.  Teaching needs still TBD.  Team Discussion:  Dysphagia with peg placement today;  Still with trach and may not be out by d/c per MD. Poor secretion management.  CGA with ADLs and min with tfs;  Ambulating short distances and supervision goals.  Revisions to Treatment Plan:  NA    Medical Summary Current Status: wallenberg infarct. dysphagia, hs confusion, trach Weekly Focus/Goal: improve respiratory, breathing, downsize trach, PEG placement  Barriers to Discharge: Medical stability  Barriers to Discharge Comments: dysphagia Possible Resolutions to Barriers: PEG, enteral feeds   Continued Need for Acute Rehabilitation Level of Care: The patient requires daily medical management by a physician with specialized training in physical medicine and rehabilitation for the  following reasons: Direction of a multidisciplinary physical rehabilitation program to maximize functional independence : Yes Medical management of patient stability for increased activity during participation in an intensive rehabilitation regime.: Yes Analysis of laboratory values and/or radiology reports with any subsequent need for medication adjustment and/or medical intervention. : Yes   I attest that I was present, lead the team conference, and concur with the assessment and plan of the team.   Lennart Pall 07/04/2019, 2:44 PM    Team conference was held via web/ teleconference due to [place] - 19

## 2019-07-04 NOTE — Progress Notes (Signed)
Physical Therapy Session Note  Patient Details  Name: Michael Bright MRN: [id number] Date of Birth: 01-31-1956  Today's Date: 07/04/2019 PT Individual Time: 5916-3846 PT Individual Time Calculation (min): 54 min   Short Term Goals: Week 1:  PT Short Term Goal 1 (Week 1): Pt will ambulate 50 ft with LRAD & CGA. PT Short Term Goal 2 (Week 1): Pt will complete bed mobility without hospital bed features with CGA. PT Short Term Goal 3 (Week 1): Pt will complete all transfers with CGA & LRAD. PT Short Term Goal 4 (Week 1): Pt will negotiate 4 steps with B rails & min assist for strengthening.  Skilled Therapeutic Interventions/Progress Updates:   Pt still awaiting PEG placement and reports being "very hungry" from NPO since yesterday but agreeable to attempt some sit <> stands and exercises from EOB. Pt requires CGA to come to EOB with assist for management of leads and lines. PT donned PMSV throughout session but pt frequently coughing it off and secretions manually suctioned throughout session. O2 sats and HR remained WNL throughout even with activity. Pt performed multiple repetitions of sit <> stands during session x 6 with min assist initially progressing to CGA with manual facilitation for anterior weightshift and cues for reorientation to midline due to L lateral bias. During seated therex EOB including heel raises, toe raises, LAQ, hip abduction squeezes and isometric hip abduction holds x 15-20 reps each with breaks needed due to coughing up secretions and intermittent postural control re-adjustments due to L lateral and posterior lean. At times pt able to self correct without PT intervention or cues. Limited upright tolerance due to fatigue. Returned to supine with CGA and heating pad placed to L shoulder for pain relief.   Therapy Documentation Precautions:  Precautions Precautions: Fall, Other (comment) Precaution Comments: NG tube, watch BP, HR & O2, PMV with therapies, trach  collar Restrictions Weight Bearing Restrictions: No   Vital Signs: Pulse Rate: 100 bpm Oxygen Therapy SpO2: 94-96% O2 Device: Tracheostomy Collar O2 Flow Rate (L/min): 5 L/min FiO2 (%): 28 % Pain:  Reports pain in L shoulder (unrated) - heat pad applied at end of session. ROM is limited.     Therapy/Group: Individual Therapy  Canary Brim Ivory Broad, PT, DPT, CBIS  07/04/2019, 3:25 PM

## 2019-07-04 NOTE — Progress Notes (Signed)
Occupational Therapy Session Note  Patient Details  Name: Michael Bright MRN: [id number] Date of Birth: [birth year]  Today's Date: 07/04/2019 OT Individual Time: 1030-1120 OT Individual Time Calculation (min): 50 min    Short Term Goals: Week 1:  OT Short Term Goal 1 (Week 1): Pt will complete 1 grooming task in standing with HR > 120 bpm OT Short Term Goal 2 (Week 1): Pt will don shorts with min A OT Short Term Goal 3 (Week 1): Pt will complete toileting tasks with min A OT Short Term Goal 4 (Week 1): Pt will demonstrate improved safety awareness, requiring no more than min cueing during ADLs  Skilled Therapeutic Interventions/Progress Updates:    Pt received supine with no c/o pain. Pt eagerly awaiting transport for PEG placement. PMSV applied.  Pt completed bed mobility to EOB with (S), heavy use of bed rails. Pt reported dizziness but all VSS, with BP 141/97. Pt initially sat EOB as he awaited BSC transfer, but pt requested for bladder scan from RN. RN entered to bladder scan and cath pt. 10 min missed. Pt completed short distance functional mobility to the sink with RW, with min A for balance. Pt brushed his teeth via oral swab and washed UB with (S). Pt with intermittent rest breaks provided for secretion management. Pt completed blocked practice of sit <> stands from w/c with min cueing for UE placement. Pt transferred back to bed and was left supine with all needs met, bed alarm set. Pt on trach with 5L O2 and FiO2 28%.    Therapy Documentation Precautions:  Precautions Precautions: Fall, Other (comment) Precaution Comments: NG tube, watch BP, HR & O2, PMV with therapies, trach collar Restrictions Weight Bearing Restrictions: No   Therapy/Group: Individual Therapy  Curtis Sites 07/04/2019, 7:15 AM

## 2019-07-04 NOTE — Progress Notes (Signed)
[place] PHYSICAL MEDICINE & REHABILITATION PROGRESS NOTE   Subjective/Complaints:  Still some confusion at night. Overall night better. PEG now on schedule for this morning. Feels "foggy" this morning, CBG 70, TF on hold  ROS: Patient denies fever, rash, sore throat, blurred vision, nausea, vomiting, diarrhea, cough, shortness of breath or chest pain, joint or back pain, headache, or mood change.   Objective:   No results found. Recent Labs    07/02/19 0420 07/03/19 0428  WBC 5.7 6.0  HGB 11.4* 11.3*  HCT 34.3* 34.9*  PLT 233 227   Recent Labs    07/03/19 0428  NA 136  K 3.9  CL 104  CO2 25  GLUCOSE 130*  BUN 15  CREATININE 0.78  CALCIUM 8.4*    Intake/Output Summary (Last 24 hours) at 07/04/2019 0943 Last data filed at 07/04/2019 0335 Gross per 24 hour  Intake -  Output 1600 ml  Net -1600 ml     Physical Exam: Vital Signs Blood pressure 120/71, pulse 76, temperature 98 F (36.7 C), temperature source Oral, resp. rate 18, height 6\' 1"  (1.854 m), weight (!) 148.2 kg, SpO2 96 %. Constitutional: No distress . Vital signs reviewed. HEENT: EOMI, oral membranes moist, NGT Neck: supple, #4 trach with PMV--no secretions on my exam. Improving vocal quality. Cardiovascular: RRR without murmur. No JVD    Respiratory: CTA Bilaterally without wheezes or rales. Normal effort    GI: BS +, non-tender, non-distended   Musculoskeletal:  General: No tendernessor edema.  Neurological: alert and oriented to person, hospital, month. Follows commands. Fair insight.   Decreased LT left hemi-face, shoulder and leg. Decreased FMC LUE and LLE ongoing.   Skin: He isnot diaphoretic. Psych: cooperative and pleasant, flat      Assessment/Plan: 1. 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  Physiatrist is providing close team supervision and 24 hour management  of active medical problems listed below.  Physiatrist and rehab team continue to assess barriers to discharge/monitor patient progress toward functional and medical goals  Care Tool:  Bathing    Body parts bathed by patient: Right arm, Left arm, Abdomen, Chest, Right upper leg, Left upper leg, Right lower leg, Left lower leg, Face, Front perineal area, Buttocks   Body parts bathed by helper: Buttocks     Bathing assist Assist Level: Contact Guard/Touching assist     Upper Body Dressing/Undressing Upper body dressing   What is the patient wearing?: Pull over shirt    Upper body assist Assist Level: Supervision/Verbal cueing    Lower Body Dressing/Undressing Lower body dressing      What is the patient wearing?: Pants, Underwear/pull up     Lower body assist Assist for lower body dressing: Minimal Assistance - Patient > 75%     Toileting Toileting    Toileting assist Assist for toileting: Moderate Assistance - Patient 50 - 74%     Transfers Chair/bed transfer  Transfers assist  Chair/bed transfer activity did not occur: Safety/medical concerns  Chair/bed transfer assist level: Minimal Assistance - Patient > 75%     Locomotion Ambulation   Ambulation assist   Ambulation activity did not occur: Safety/medical concerns  Assist level: 2 helpers(min A but +2 needed for w/c follow and O2 tank management) Assistive device: Walker-rolling Max distance: 40'   Walk 10 feet activity   Assist  Walk 10 feet activity did not occur: Safety/medical concerns  Assist level:  Minimal Assistance - Patient > 75% Assistive device: Walker-rolling   Walk 50 feet activity   Assist Walk 50 feet with 2 turns activity did not occur: Safety/medical concerns         Walk 150 feet activity   Assist Walk 150 feet activity did not occur: Safety/medical concerns         Walk 10 feet on uneven surface  activity   Assist Walk 10 feet on uneven surfaces activity did  not occur: Safety/medical concerns         Wheelchair     Assist Will patient use wheelchair at discharge?: (TBD) Type of Wheelchair: Manual    Wheelchair assist level: Supervision/Verbal cueing Max wheelchair distance: 10 ft    Wheelchair 50 feet with 2 turns activity    Assist    Wheelchair 50 feet with 2 turns activity did not occur: Safety/medical concerns       Wheelchair 150 feet activity     Assist          Blood pressure 120/71, pulse 76, temperature 98 F (36.7 C), temperature source Oral, resp. rate 18, height 6\' 1"  (1.854 m), weight (!) 148.2 kg, SpO2 96 %.  Medical Problem List and Plan: 1. Reduced Mobility, swllow function and self care secondary to left lateral medullary infarct, Wallenberg type syndrome, course complicated by multiple medical issues and prolonged hospital stay  --Continue CIR therapies including PT, OT, and SLP  -team conference today  2. Antithrombotics: -DVT/anticoagulation:Pharmaceutical:Lovenox -antiplatelet therapy: ASA. Plavix on hold for PEG  3. Pain Management:n/A 4. Mood/delirium: improved last night. Hopefully the beginning of trend -antipsychotic agents: continues to demonstrate HS confusion.   increased seroquel and adjust timing to 2100. Have stopped am dose  -celexa added for anxiety   -ua negative, ucx pending, other labs and images ok 5. Neuropsych: This patientis not fullycapable of making decisions onhisown behalf. 6. Skin/Wound Care:Routine pressure relief measures. 7. Fluids/Electrolytes/Nutrition:NPO with tube feeds for now   follow up labs next week.   8. T2DM: Hgb A1c-10.6. Now on lantus with SSI for elevated BS. Continue to monitor BS every 4 hours.   -improving control.  lantus  33u CBG (last 3)  Recent Labs    07/04/19 0339 07/04/19 0758 07/04/19 0836  GLUCAP 75 69* 77   -DV 1/2ns to help sustain cbg's until PEG 9. Recurrent PNA--H flu on 9/7: Has been  on prolonged antibiotics--narrowed to rocephin on 9/8--ends 9/12  -afebrile currently  -9/10 CXR reviewed and without acute disease. 10. Respiratory failure s/p trach: Continue 28 % ATC.   -PMV during day  -TID robinul to help manage secretions===better but still an issue.  -downsized to #4 trach 9/10. Should help some irritation of airway.   -remove NGT after PEG placed which should help also  -has significant OSA. Will need CPAP when decannulated. May still be able to decannulate prior discharge but would like to see secretions a bit better first.   -consider plugging trials this week depending upon how things go 11. Tachycardia: HR better, in 90's now.   12. Severe dysphagia: severe pharyngeal dysphagia on MBS yesterday  -appreciate IR follow up---PEG placement today 9/15  -resume TF tomorrow with strict aspiration precautions.  -convert to bolus schedule eventually 13. Brief unresponsive episodes none repeorted fo r24 h  ?seizures vs vasovagal events related to suctioning  -EEG performed and does not demonstrate seizure activity  -hold on anticonvulsant at this time   -EKG reviewed and NSR  LOS: 7 days A FACE TO FACE EVALUATION WAS PERFORMED  Meredith Staggers 07/04/2019, 9:43 AM

## 2019-07-04 NOTE — Progress Notes (Signed)
Occupational Therapy Note  Patient Details  Name: Michael Bright MRN: [id number] Date of Birth: 1956/02/25  Today's Date: 07/04/2019 OT Missed Time: 45 Minutes Missed Time Reason: Unavailable (comment)(PEG placement)  Pt off unit receiving peg placement.   Curtis Sites 07/04/2019, 4:16 PM

## 2019-07-04 NOTE — Sedation Documentation (Signed)
No EtCO2 monitoring available, pt has trach mask

## 2019-07-05 ENCOUNTER — Inpatient Hospital Stay (HOSPITAL_COMMUNITY): Payer: Medicare Other

## 2019-07-05 ENCOUNTER — Inpatient Hospital Stay (HOSPITAL_COMMUNITY): Payer: Medicare Other | Admitting: Speech Pathology

## 2019-07-05 DIAGNOSIS — E1169 Type 2 diabetes mellitus with other specified complication: Secondary | ICD-10-CM

## 2019-07-05 DIAGNOSIS — I69391 Dysphagia following cerebral infarction: Principal | ICD-10-CM

## 2019-07-05 DIAGNOSIS — R1312 Dysphagia, oropharyngeal phase: Secondary | ICD-10-CM

## 2019-07-05 DIAGNOSIS — E118 Type 2 diabetes mellitus with unspecified complications: Secondary | ICD-10-CM

## 2019-07-05 DIAGNOSIS — Z931 Gastrostomy status: Secondary | ICD-10-CM

## 2019-07-05 DIAGNOSIS — E1165 Type 2 diabetes mellitus with hyperglycemia: Secondary | ICD-10-CM

## 2019-07-05 LAB — GLUCOSE, CAPILLARY
Glucose-Capillary: 96 mg/dL (ref 70–99)
Glucose-Capillary: 92 mg/dL (ref 70–99)
Glucose-Capillary: 94 mg/dL (ref 70–99)
Glucose-Capillary: 105 mg/dL — ABNORMAL HIGH (ref 70–99)
Glucose-Capillary: 101 mg/dL — ABNORMAL HIGH (ref 70–99)
Glucose-Capillary: 155 mg/dL — ABNORMAL HIGH (ref 70–99)

## 2019-07-05 MED ORDER — IPRATROPIUM-ALBUTEROL 0.5-2.5 (3) MG/3ML IN SOLN
3.0000 mL | RESPIRATORY_TRACT | Status: DC | PRN
  Administered 2019-07-13: 03:00:00 3 mL via RESPIRATORY_TRACT
  Filled 2019-07-05: qty 3

## 2019-07-05 MED ORDER — ACETAMINOPHEN 325 MG PO TABS
325.0000 mg | ORAL_TABLET | ORAL | Status: DC | PRN
  Administered 2019-07-05 – 2019-07-17 (×12): 650 mg
  Filled 2019-07-05 (×12): qty 2

## 2019-07-05 MED ORDER — INSULIN ASPART 100 UNIT/ML ~~LOC~~ SOLN
0.0000 [IU] | Freq: Three times a day (TID) | SUBCUTANEOUS | Status: DC
  Administered 2019-07-05 – 2019-07-06 (×2): 3 [IU] via SUBCUTANEOUS
  Administered 2019-07-06: 2 [IU] via SUBCUTANEOUS
  Administered 2019-07-06: 19:00:00 3 [IU] via SUBCUTANEOUS
  Administered 2019-07-07: 5 [IU] via SUBCUTANEOUS
  Administered 2019-07-07 (×3): 2 [IU] via SUBCUTANEOUS
  Administered 2019-07-08: 3 [IU] via SUBCUTANEOUS
  Administered 2019-07-08: 2 [IU] via SUBCUTANEOUS
  Administered 2019-07-09: 18:00:00 5 [IU] via SUBCUTANEOUS
  Administered 2019-07-09 – 2019-07-10 (×3): 3 [IU] via SUBCUTANEOUS
  Administered 2019-07-10: 22:00:00 5 [IU] via SUBCUTANEOUS
  Administered 2019-07-11 – 2019-07-12 (×4): 2 [IU] via SUBCUTANEOUS
  Administered 2019-07-12 – 2019-07-14 (×4): 3 [IU] via SUBCUTANEOUS
  Administered 2019-07-14: 13:00:00 2 [IU] via SUBCUTANEOUS
  Administered 2019-07-14: 3 [IU] via SUBCUTANEOUS
  Administered 2019-07-15: 5 [IU] via SUBCUTANEOUS
  Administered 2019-07-16: 18:00:00 2 [IU] via SUBCUTANEOUS

## 2019-07-05 MED ORDER — PRO-STAT SUGAR FREE PO LIQD
30.0000 mL | Freq: Two times a day (BID) | ORAL | Status: DC
  Administered 2019-07-05 – 2019-07-06 (×3): 30 mL via ORAL
  Filled 2019-07-05 (×4): qty 30

## 2019-07-05 MED ORDER — OSMOLITE 1.5 CAL PO LIQD
474.0000 mL | Freq: Three times a day (TID) | ORAL | Status: DC
  Administered 2019-07-05 – 2019-07-06 (×3): 474 mL
  Filled 2019-07-05 (×2): qty 474

## 2019-07-05 MED ORDER — OSMOLITE 1.5 CAL PO LIQD: 1000.0000 mL | Freq: Three times a day (TID) | ORAL | Status: DC

## 2019-07-05 MED ORDER — FREE WATER
200.0000 mL | Freq: Three times a day (TID) | Status: DC
  Administered 2019-07-05 – 2019-07-17 (×48): 200 mL

## 2019-07-05 MED ORDER — BUDESONIDE 0.25 MG/2ML IN SUSP
0.2500 mg | Freq: Two times a day (BID) | RESPIRATORY_TRACT | Status: DC
  Administered 2019-07-05 – 2019-07-17 (×24): 0.25 mg via RESPIRATORY_TRACT
  Filled 2019-07-05 (×24): qty 2

## 2019-07-05 NOTE — Progress Notes (Signed)
Occupational Therapy Session Note  Patient Details  Name: Michael Bright MRN: [id number] Date of Birth: 1956-03-13  Today's Date: 07/05/2019 OT Individual Time: 1445-1530 OT Individual Time Calculation (min): 45 min    Short Term Goals: Week 2:  OT Short Term Goal 1 (Week 2): Pt will don pants with CGA OT Short Term Goal 2 (Week 2): Pt will require no more than 2 rest breaks in 30 min to manage secretions without suction OT Short Term Goal 3 (Week 2): Pt will complete standing level grooming task with all VSS for > 3 minutes  Skilled Therapeutic Interventions/Progress Updates:    Pt received sitting up in the w/c with c/o pain in L shoulder- heat pack provided at end of session. PMSV applied, all VSS. Pt completed 120 ft of functional mobility with min A, aircast on L ankle with slight pronation occurring. Pt completed functional stepping activity with alternating BLE, min A required and BUE support on RW. Activity completed 3x20 repetitions for increasing functional activity tolerance/cardiorespiratory support, and dynamic standing balance. Pt then completed 5x sit <> stands with no UE support, focus on anterior weight shift and reducing reliance on RW, CGA-min A provided overall. Discussed d/c with trach possibility and effect on ADLs/IADLs- provided pt with shower trach cover. Pt returned to his room, only making it 75 ft before needing seated rest break. Pt was left sitting up in his w/c with 5L O2, 28% FiO2 via trach. Pt left with moist heat pack on L shoulder for pain relief.   Therapy Documentation Precautions:  Precautions Precautions: Fall, Other (comment) Precaution Comments: NG tube, watch BP, HR & O2, PMV with therapies, trach collar Restrictions Weight Bearing Restrictions: No   Therapy/Group: Individual Therapy  Curtis Sites 07/05/2019, 3:48 PM

## 2019-07-05 NOTE — Progress Notes (Signed)
Occupational Therapy Weekly Progress Note  Patient Details  Name: Michael Bright MRN: [id number] Date of Birth: 08/08/1956  Beginning of progress report period: June 28, 2019 End of progress report period: July 05, 2019  Today's Date: 07/05/2019 OT Individual Time: 1030-1100 OT Individual Time Calculation (min): 30 min    Patient has met 4 of 4 short term goals.  Pt has made good progress toward his OT goals. He is able to tolerate wearing the PMSV during Therapy session and is able to remain >94% SpO2.Pt has made gains with his ADLs, performing LB ADLs with CGA and UB ADLs with CGA. Pt is progressing toward his LTG nicely.   Patient continues to demonstrate the following deficits: muscle weakness, decreased cardiorespiratoy endurance and decreased oxygen support, decreased problem solving and decreased safety awareness and decreased standing balance, decreased postural control and decreased balance strategies and therefore will continue to benefit from skilled OT intervention to enhance overall performance with BADL.  Patient progressing toward long term goals..  Continue plan of care.  OT Short Term Goals Week 1:  OT Short Term Goal 1 (Week 1): Pt will complete 1 grooming task in standing with HR > 120 bpm OT Short Term Goal 1 - Progress (Week 1): Met OT Short Term Goal 2 (Week 1): Pt will don shorts with min A OT Short Term Goal 2 - Progress (Week 1): Met OT Short Term Goal 3 (Week 1): Pt will complete toileting tasks with min A OT Short Term Goal 3 - Progress (Week 1): Met OT Short Term Goal 4 (Week 1): Pt will demonstrate improved safety awareness, requiring no more than min cueing during ADLs OT Short Term Goal 4 - Progress (Week 1): Met Week 2:  OT Short Term Goal 1 (Week 2): Pt will don pants with CGA OT Short Term Goal 2 (Week 2): Pt will require no more than 2 rest breaks in 30 min to manage secretions without suction OT Short Term Goal 3 (Week 2): Pt will complete  standing level grooming task with all VSS for > 3 minutes  Skilled Therapeutic Interventions/Progress Updates:    Pt received supine, c/o pain in L shoulder, 10/10 with movement, pt reporting it feels like "a pulled muscle". RN Jiles Garter notified. Pt completed bed mobility to OEB with (S). He required EOB to be elevated to stand, with min cueing for UE placement. Pt attempted 2x to stand before taking a rest break and then completing sit > stand with min A. Pt completed 15 ft functional mobility into bathroom with CGA-min A and line management. Pt transferred onto toilet with min A to manage clothing. Pt unable to void and requested to get up.Pt able to pull up shorts in standing despite pants falling all the way to the floor, with min A for balance support. Pt completed ambulatory transfer back to w/c and was left sitting up with all needs met.   Therapy Documentation Precautions:  Precautions Precautions: Fall, Other (comment) Precaution Comments: NG tube, watch BP, HR & O2, PMV with therapies, trach collar Restrictions Weight Bearing Restrictions: No   Therapy/Group: Individual Therapy  Curtis Sites 07/05/2019, 7:29 AM

## 2019-07-05 NOTE — Progress Notes (Addendum)
Physical Therapy Session Note  Patient Details  Name: Michael Bright MRN: [id number] Date of Birth: 06-14-1956  Today's Date: 07/05/2019 PT Individual Time: 1300-1403 PT Individual Time Calculation (min): 63 min   Short Term Goals: Week 1:  PT Short Term Goal 1 (Week 1): Pt will ambulate 50 ft with LRAD & CGA. PT Short Term Goal 2 (Week 1): Pt will complete bed mobility without hospital bed features with CGA. PT Short Term Goal 3 (Week 1): Pt will complete all transfers with CGA & LRAD. PT Short Term Goal 4 (Week 1): Pt will negotiate 4 steps with B rails & min assist for strengthening.  Skilled Therapeutic Interventions/Progress Updates:  Pt resting in bed.  Using bed features, supine> sit with CGA.  Pt sat EOB x 5 min with cues for midline orientation.  Pt c/o mild dizziness upon sitting up.  This resolved in around 2 min.  Sit> stand with mod assist from raised  bed to RW.   Stand pivot to w/c using RW, with min assist.  PT donned bil shoes and PMV.  Shoes are new, snug, lace ups but with shallow depth and interior sock feature.  W/c propulsion x 50'  using bil LEs for strengthening, focusing on neutral L hip rotation and placing L foot flat.  L foot is severely inverted during this task. O2 sats 86% after this task; quickly rose to >90% with cues and rest.  Use of Kinetron from w/c level at level 40 cm/sec x 2 min targeting gluteal muscles, rest, x 2 min targeting quadricpes muscles.    Gait training on level tile using RW,  Min assist.  Cues for upright trunk, forward gaze, and L foot strike to decrease inversion.  After 40', as pt fatigued, L ankle inversion increased and became unsafe.  PT donned air cast L ankle; shoe barely fits over air cast.  Continued gait training x 40' as above; air cast conrtolled ankle well.  O2 sats above 90% after exercise except for propelling w/c with bil feet.  At end of session, pt seated in w/c with needs at hand, bil new shoes doffed; air cast and  old shoes donned.  PT had extensive conversation with pt about the best shoes for now.  He will talk to his son and see about returning these and getting shoes with greater depth, without interior sock feature.      Therapy Documentation Precautions:  Precautions Precautions: Fall, Other (comment) Precaution Comments: NG tube, watch BP, HR & O2, PMV with therapies, trach collar Restrictions Weight Bearing Restrictions: No    Vital Signs: Oxygen Therapy SpO2: 96 % O2 Device: Tracheostomy Collar O2 Flow Rate (L/min): 5 L/min FiO2 (%): 28 % Pain: Pain Assessment Pain Scale: 0-10 Pain Score: 0-No pain Pain Type: Acute pain Pain Location: Shoulder Pain Descriptors / Indicators: Aching Pain Frequency: Intermittent Pain Onset: Gradual Pain Intervention(s): Medication (See eMAR)       Therapy/Group: Individual Therapy  Jordane Hisle 07/05/2019, 2:24 PM

## 2019-07-05 NOTE — Plan of Care (Signed)
  Problem: Consults Goal: RH STROKE PATIENT EDUCATION Description: See Patient Education module for education specifics  Outcome: Progressing   Problem: RH BOWEL ELIMINATION Goal: RH STG MANAGE BOWEL WITH ASSISTANCE Description: STG Manage Bowel with  mod I Assistance. Outcome: Progressing Flowsheets (Taken 07/05/2019 1647) STG: Pt will manage bowels with assistance: 4-Minimum assistance   Problem: RH BLADDER ELIMINATION Goal: RH STG MANAGE BLADDER WITH ASSISTANCE Description: STG Manage Bladder With min Assistance Outcome: Progressing Flowsheets (Taken 07/05/2019 1647) STG: Pt will manage bladder with assistance: 1-Total assistance   Problem: RH SAFETY Goal: RH STG ADHERE TO SAFETY PRECAUTIONS W/ASSISTANCE/DEVICE Description: STG Adhere to Safety Precautions With cues/reminders Assistance/Device. Outcome: Progressing Flowsheets (Taken 07/05/2019 1647) STG:Pt will adhere to safety precautions with assistance/device: 4-Minimal assistance   Problem: RH PAIN MANAGEMENT Goal: RH STG PAIN MANAGED AT OR BELOW PT'S PAIN GOAL Description: At or below level 5 Outcome: Progressing   Problem: RH KNOWLEDGE DEFICIT Goal: RH STG INCREASE KNOWLEDGE OF DIABETES Description: Pt and wife will be able to manage diabetes using handouts for medication, dietary restrictions and cbg monitoring independently Outcome: Progressing Goal: RH STG INCREASE KNOWLEDGE OF HYPERTENSION Description: Pt and wife will be able to manage HTN using handouts for medication, dietary restrictions and monitoring independently Outcome: Progressing Goal: RH STG INCREASE KNOWLEDGE OF DYSPHAGIA/FLUID INTAKE Outcome: Progressing Goal: RH STG INCREASE KNOWLEGDE OF HYPERLIPIDEMIA Description: Pt and wife will be able to manage HLD using handouts for medication, dietary restrictions and monitoring independently Outcome: Progressing Goal: RH STG INCREASE KNOWLEDGE OF STROKE PROPHYLAXIS Description: Pt and wife will be able to  manage risks for secondary stroke using handouts for medication, dietary restrictions independently Outcome: Progressing

## 2019-07-05 NOTE — Progress Notes (Signed)
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 Laurence Ferrari. Patient awake and alert sitting in bed. Complains of mild tenderness at gastrostomy tube site, as expected. Gastrostomy tube site c/d/i.   Allergies: Patient has no known allergies.  Medications: Prior to Admission medications   Medication Sig Start Date End Date Taking? Authorizing Provider  acetaminophen (TYLENOL) 160 MG/5ML solution Place 20.3 mLs (650 mg total) into feeding tube every 4 (four) hours as needed for mild pain (or temp > 37.5 C (99.5 F)). 06/27/19   Debbe Odea, MD  Amino Acids-Protein Hydrolys (FEEDING SUPPLEMENT, PRO-STAT SUGAR FREE 64,) LIQD Place 30 mLs into feeding tube 3 (three) times daily. 06/27/19   Debbe Odea, MD  amLODipine (NORVASC) 10 MG tablet Place 1 tablet (10 mg total) into feeding tube daily. 06/28/19   Debbe Odea, MD  aspirin 81 MG chewable tablet Place 1 tablet (81 mg total) into feeding tube daily. 06/28/19   Debbe Odea, MD  atorvastatin (LIPITOR) 40 MG tablet Place 1 tablet (40 mg total) into feeding tube daily at 6 PM. 06/27/19   Debbe Odea, MD  bisacodyl (DULCOLAX) 10 MG suppository Place 1 suppository (10 mg total) rectally daily as needed for moderate constipation. 06/27/19   Debbe Odea, MD  budesonide (PULMICORT) 0.25 MG/2ML nebulizer solution Take 2 mLs (0.25 mg total) by nebulization 2 (two) times daily. 06/27/19   Debbe Odea, MD  cefTRIAXone 2 g in sodium chloride 0.9 % 100 mL Inject 2 g into the vein daily. 06/28/19   Debbe Odea, MD  clopidogrel (PLAVIX) 75 MG tablet Place 1 tablet (75 mg total) into feeding tube daily. 06/28/19   Debbe Odea, MD  docusate (COLACE) 50 MG/5ML liquid Place 10 mLs (100 mg total) into feeding tube 2 (two) times daily as needed for mild  constipation. 06/27/19   Debbe Odea, MD  enoxaparin (LOVENOX) 80 MG/0.8ML injection Inject 0.7 mLs (70 mg total) into the skin daily. 06/28/19   Debbe Odea, MD  famotidine (PEPCID) 40 MG/5ML suspension Place 2.5 mLs (20 mg total) into feeding tube 2 (two) times daily. 06/27/19   Debbe Odea, MD  finasteride (PROSCAR) 5 MG tablet Take 1 tablet (5 mg total) by mouth daily. 06/28/19   Debbe Odea, MD  glycopyrrolate (ROBINUL) 1 MG tablet Place 2 tablets (2 mg total) into feeding tube 2 (two) times daily. 06/27/19   Debbe Odea, MD  insulin aspart (NOVOLOG) 100 UNIT/ML injection Inject 0-15 Units into the skin every 4 (four) hours. 06/27/19   Debbe Odea, MD  insulin glargine (LANTUS) 100 UNIT/ML injection Inject 0.25 mLs (25 Units total) into the skin daily. 06/28/19   Debbe Odea, MD  Nutritional Supplements (FEEDING SUPPLEMENT, OSMOLITE 1.2 CAL,) LIQD Place 1,000 mLs into feeding tube continuous. 06/27/19   Debbe Odea, MD  QUEtiapine (SEROQUEL) 100 MG tablet Place 1 tablet (100 mg total) into feeding tube at bedtime. 06/27/19   Debbe Odea, MD  QUEtiapine (SEROQUEL) 50 MG tablet Place 1 tablet (50 mg total) into feeding tube daily. 06/28/19   Debbe Odea, MD  scopolamine (TRANSDERM-SCOP) 1 MG/3DAYS Place 1 patch (1.5 mg total) onto the skin every 3 (three) days. 06/27/19   Debbe Odea, MD  senna-docusate (SENOKOT-S) 8.6-50 MG tablet Place 1 tablet into feeding tube at bedtime as needed for mild constipation or moderate constipation. 06/27/19  Calvert Cantorizwan, Saima, MD     Vital Signs: BP 117/69 (BP Location: Right Arm)    Pulse 77    Temp 98 F (36.7 C) (Oral)    Resp 15    Ht 6\' 1"  (1.854 m)    Wt (!) 322 lb 5 oz (146.2 kg)    SpO2 96%    BMI 42.52 kg/m   Physical Exam Vitals signs and nursing note reviewed.  Constitutional:      General: He is not in acute distress.    Appearance: Normal appearance.     Comments: Tracheostomy.  Pulmonary:     Effort: Pulmonary effort is normal. No respiratory  distress.     Comments: Tracheostomy. Abdominal:     Comments: Gastrostomy tube site with mild tenderness, no erythema, drainage, or active bleeding.  Skin:    General: Skin is warm and dry.  Neurological:     Mental Status: He is alert and oriented to person, place, and time.  Psychiatric:        Mood and Affect: Mood normal.        Behavior: Behavior normal.        Thought Content: Thought content normal.        Judgment: Judgment normal.     Imaging: Ir Gastrostomy Tube Mod Sed  Result Date: 07/04/2019 INDICATION: 63 year old male with recent cerebral infarct and dysphagia. He requires percutaneous gastrostomy tube placement. EXAM: Fluoroscopically guided placement of percutaneous pull-through gastrostomy tube Interventional Radiologist:  Sterling BigHeath K. McCullough, MD MEDICATIONS: 2 g Ancef; 1 mg glucagon antibiotics were administered within 1 hour of the procedure. ANESTHESIA/SEDATION: Versed 1 mg IV; Fentanyl 50 mcg IV Moderate Sedation Time:  15 minutes The patient was continuously monitored during the procedure by the interventional radiology nurse under my direct supervision. CONTRAST:  20mL OMNIPAQUE IOHEXOL 300 MG/ML  SOLN FLUOROSCOPY TIME:  Fluoroscopy Time: 1 minutes 26 seconds (27 mGy). COMPLICATIONS: None immediate. PROCEDURE: Informed written consent was obtained from the patient after a thorough discussion of the procedural risks, benefits and alternatives. All questions were addressed. Maximal Sterile Barrier Technique was utilized including caps, mask, sterile gowns, sterile gloves, sterile drape, hand hygiene and skin antiseptic. A timeout was performed prior to the initiation of the procedure. Maximal barrier sterile technique utilized including caps, mask, sterile gowns, sterile gloves, large sterile drape, hand hygiene, and chlorhexadine skin prep. An angled catheter was advanced over a wire under fluoroscopic guidance through the nose, down the esophagus and into the body of the  stomach. The stomach was then insufflated with several 100 ml of air. Fluoroscopy confirmed location of the gastric bubble, as well as inferior displacement of the barium stained colon. Under direct fluoroscopic guidance, a single T-tack was placed, and the anterior gastric wall drawn up against the anterior abdominal wall. Percutaneous access was then obtained into the mid gastric body with an 18 gauge sheath needle. Aspiration of air, and injection of contrast material under fluoroscopy confirmed needle placement. An Amplatz wire was advanced in the gastric body and the access needle exchanged for a 9-French vascular sheath. A snare device was advanced through the vascular sheath and an Amplatz wire advanced through the angled catheter. The Amplatz wire was successfully snared and this was pulled up through the esophagus and out the mouth. A 20-French Burnell BlanksKimberly Clark MIC-PEG tube was then connected to the snare and pulled through the mouth, down the esophagus, into the stomach and out to the anterior abdominal wall. Hand injection of contrast material confirmed  intragastric location. The T-tack retention suture was then cut. The pull through peg tube was then secured with the external bumper and capped. The patient will be observed for several hours with the newly placed tube on low wall suction to evaluate for any post procedure complication. The patient tolerated the procedure well, there is no immediate complication. IMPRESSION: Successful placement of a 20 French pull through gastrostomy tube. Electronically Signed   By: Malachy Moan M.D.   On: 07/04/2019 17:32    Labs:  CBC: Recent Labs    06/28/19 0500 06/29/19 0528 07/02/19 0420 07/03/19 0428  WBC 5.6 6.2 5.7 6.0  HGB 12.2* 12.0* 11.4* 11.3*  HCT 37.3* 37.3* 34.3* 34.9*  PLT 273 275 233 227    COAGS: Recent Labs    05/30/19 0151 06/09/19 0506 07/02/19 0420  INR 1.0 1.1 1.2  APTT 21* 34  --     BMP: Recent Labs     06/26/19 0445 06/28/19 0500 06/29/19 0528 07/03/19 0428  NA 137 135 135 136  K 4.2 3.9 3.6 3.9  CL 97* 99 101 104  CO2 31 27 25 25   GLUCOSE 168* 206* 171* 130*  BUN 14 15 15 15   CALCIUM 8.7* 8.3* 8.4* 8.4*  CREATININE 0.84 0.88 0.90 0.78  GFRNONAA >60 >60 >60 >60  GFRAA >60 >60 >60 >60    LIVER FUNCTION TESTS: Recent Labs    06/04/19 0655 06/05/19 0927  06/10/19 1625 06/11/19 0514 06/28/19 0500 06/29/19 0528  BILITOT 0.5 0.3  --   --   --  0.5 0.5  AST 27 24  --   --   --  27 29  ALT 31 27  --   --   --  36 40  ALKPHOS 71 71  --   --   --  63 61  PROT 6.7 6.5  --   --   --  6.5 6.7  ALBUMIN 2.7* 2.5*   < > 2.2* 2.2* 2.3* 2.5*   < > = values in this interval not displayed.    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 Archer Asa. Gastrostomy tube site stable- tube is ready for use. Further plans per CIR- appreciate and agree with management. Please call IR with questions/concerns.   Electronically Signed: Elwin Mocha, PA-C 07/05/2019, 9:54 AM   I spent a total of 15 Minutes at the the patient's bedside AND on the patient's hospital floor or unit, greater than 50% of which was counseling/coordinating care for dysphagia s/p gastrostomy tube placement.

## 2019-07-05 NOTE — Progress Notes (Signed)
Speech Language Pathology Daily Session Note  Patient Details  Name: Michael Bright MRN: [id number] Date of Birth: 01-17-1956  Today's Date: 07/05/2019 SLP Individual Time: 8144-8185 SLP Individual Time Calculation (min): 55 min  Short Term Goals: Week 2: SLP Short Term Goal 1 (Week 2): Pt will demonstrate tolerance of PMSV in 60 minute intervals with vitals signs remaining WFL. SLP Short Term Goal 2 (Week 2): Pt will preform throat clear and swallow to reduce vocal qaulity wetness with min A verbal cues. SLP Short Term Goal 3 (Week 2): Pt will consume ice chip trials to preserve swallow function with moderate s/s aspiration with overall supervision level verbal cues. SLP Short Term Goal 4 (Week 2): Pt will complete semi-complex problem solving tasks with min A verbal cues. SLP Short Term Goal 5 (Week 2): Pt will utilizie memory compensatory strategies to recall new, daily information with min A verbal cues.  Skilled Therapeutic Interventions: Skilled treatment session focused on speech and dysphagia goals. PMSV was donned for ~55 minutes with all vitals remaining WFL. Oral care was performed via the suction toothbrush and patient required extra time and supervision level verbal cues for use of effortful swallows with lemon glycerin swab sticks. Patient utilized multiples swallows in 75% of trials and reported he felt the salvia was "trying to come back up." This resulted in overt coughing in 25% of trials with PMSV being expelled from trach hub X5 due to secretions with inability to expectorate them orally. SLP provided education in regards to patient's current swallowing function and poor prognosis of initiating a PO diet prior to discharge home. Patient verbalized understanding. Patient left upright in bed with alarm on and RN present. Continue with current plan of care.      Pain No/Denies Pain   Therapy/Group: Individual Therapy  Lateria Alderman 07/05/2019, 10:19 AM

## 2019-07-05 NOTE — Progress Notes (Signed)
[facility] PHYSICAL MEDICINE & REHABILITATION PROGRESS NOTE   Subjective/Complaints: Patient seen laying in bed this morning.  He states he slept fairly overnight due to some abdominal discomfort.  Discussed Tylenol with nursing.  ROS: + Abdominal discomfort.  Denies CP, SOB, N/V/D  Objective:   Ir Gastrostomy Tube Mod Sed  Result Date: 07/04/2019 INDICATION: 63 year old male with recent cerebral infarct and dysphagia. He requires percutaneous gastrostomy tube placement. EXAM: Fluoroscopically guided placement of percutaneous pull-through gastrostomy tube Interventional Radiologist:  Criselda Peaches, MD MEDICATIONS: 2 g Ancef; 1 mg glucagon antibiotics were administered within 1 hour of the procedure. ANESTHESIA/SEDATION: Versed 1 mg IV; Fentanyl 50 mcg IV Moderate Sedation Time:  15 minutes The patient was continuously monitored during the procedure by the interventional radiology nurse under my direct supervision. CONTRAST:  58mL OMNIPAQUE IOHEXOL 300 MG/ML  SOLN FLUOROSCOPY TIME:  Fluoroscopy Time: 1 minutes 26 seconds (27 mGy). COMPLICATIONS: None immediate. PROCEDURE: Informed written consent was obtained from the patient after a thorough discussion of the procedural risks, benefits and alternatives. All questions were addressed. Maximal Sterile Barrier Technique was utilized including caps, mask, sterile gowns, sterile gloves, sterile drape, hand hygiene and skin antiseptic. A timeout was performed prior to the initiation of the procedure. Maximal barrier sterile technique utilized including caps, mask, sterile gowns, sterile gloves, large sterile drape, hand hygiene, and chlorhexadine skin prep. An angled catheter was advanced over a wire under fluoroscopic guidance through the nose, down the esophagus and into the body of the stomach. The stomach was then insufflated with several 100 ml of air. Fluoroscopy confirmed location of the gastric bubble, as well as inferior displacement of the  barium stained colon. Under direct fluoroscopic guidance, a single T-tack was placed, and the anterior gastric wall drawn up against the anterior abdominal wall. Percutaneous access was then obtained into the mid gastric body with an 18 gauge sheath needle. Aspiration of air, and injection of contrast material under fluoroscopy confirmed needle placement. An Amplatz wire was advanced in the gastric body and the access needle exchanged for a 9-French vascular sheath. A snare device was advanced through the vascular sheath and an Amplatz wire advanced through the angled catheter. The Amplatz wire was successfully snared and this was pulled up through the esophagus and out the mouth. A 20-French Alinda Dooms MIC-PEG tube was then connected to the snare and pulled through the mouth, down the esophagus, into the stomach and out to the anterior abdominal wall. Hand injection of contrast material confirmed intragastric location. The T-tack retention suture was then cut. The pull through peg tube was then secured with the external bumper and capped. The patient will be observed for several hours with the newly placed tube on low wall suction to evaluate for any post procedure complication. The patient tolerated the procedure well, there is no immediate complication. IMPRESSION: Successful placement of a 20 French pull through gastrostomy tube. Electronically Signed   By: Jacqulynn Cadet M.D.   On: 07/04/2019 17:32   Recent Labs    07/03/19 0428  WBC 6.0  HGB 11.3*  HCT 34.9*  PLT 227   Recent Labs    07/03/19 0428  NA 136  K 3.9  CL 104  CO2 25  GLUCOSE 130*  BUN 15  CREATININE 0.78  CALCIUM 8.4*    Intake/Output Summary (Last 24 hours) at 07/05/2019 0948 Last data filed at 07/05/2019 0400 Gross per 24 hour  Intake 1599.14 ml  Output 1450 ml  Net 149.14 ml  Physical Exam: Vital Signs Blood pressure 117/69, pulse 77, temperature 98 F (36.7 C), temperature source Oral, resp. rate 15,  height 6\' 1"  (1.854 m), weight (!) 146.2 kg, SpO2 96 %. Constitutional: No distress . Vital signs reviewed. HENT: Normocephalic.  Atraumatic. Neck: Trach. Eyes: EOMI. No discharge. Cardiovascular: No JVD. Respiratory: Normal effort.  No stridor. GI: Distended.  Bowel sounds slowed.  + PEG. Skin: Warm and dry.  Intact. Psych: Normal mood.  Normal behavior. Musc: No edema.  No tenderness. Neurological: Alert and oriented Motor: 4-4+/5 throughout   Assessment/Plan: 1. 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  Physiatrist is providing close team supervision and 24 hour management of active medical problems listed below.  Physiatrist and rehab team continue to assess barriers to discharge/monitor patient progress toward functional and medical goals  Care Tool:  Bathing    Body parts bathed by patient: Right arm, Left arm, Abdomen, Chest, Right upper leg, Left upper leg, Right lower leg, Left lower leg, Face, Front perineal area, Buttocks   Body parts bathed by helper: Buttocks     Bathing assist Assist Level: Contact Guard/Touching assist     Upper Body Dressing/Undressing Upper body dressing   What is the patient wearing?: Pull over shirt    Upper body assist Assist Level: Supervision/Verbal cueing    Lower Body Dressing/Undressing Lower body dressing      What is the patient wearing?: Pants, Underwear/pull up     Lower body assist Assist for lower body dressing: Minimal Assistance - Patient > 75%     Toileting Toileting    Toileting assist Assist for toileting: Moderate Assistance - Patient 50 - 74%     Transfers Chair/bed transfer  Transfers assist  Chair/bed transfer activity did not occur: Safety/medical concerns  Chair/bed transfer assist level: Minimal Assistance - Patient > 75%     Locomotion Ambulation   Ambulation assist    Ambulation activity did not occur: Safety/medical concerns  Assist level: 2 helpers(min A but +2 needed for w/c follow and O2 tank management) Assistive device: Walker-rolling Max distance: 40'   Walk 10 feet activity   Assist  Walk 10 feet activity did not occur: Safety/medical concerns  Assist level: Minimal Assistance - Patient > 75% Assistive device: Walker-rolling   Walk 50 feet activity   Assist Walk 50 feet with 2 turns activity did not occur: Safety/medical concerns         Walk 150 feet activity   Assist Walk 150 feet activity did not occur: Safety/medical concerns         Walk 10 feet on uneven surface  activity   Assist Walk 10 feet on uneven surfaces activity did not occur: Safety/medical concerns         Wheelchair     Assist Will patient use wheelchair at discharge?: (TBD) Type of Wheelchair: Manual    Wheelchair assist level: Supervision/Verbal cueing Max wheelchair distance: 10 ft    Wheelchair 50 feet with 2 turns activity    Assist    Wheelchair 50 feet with 2 turns activity did not occur: Safety/medical concerns       Wheelchair 150 feet activity     Assist          Blood pressure 117/69, pulse 77, temperature 98 F (36.7 C), temperature source Oral, resp. rate 15, height 6\' 1"  (1.854 m), weight (!) 146.2 kg, SpO2 96 %.  Medical Problem  List and Plan: 1. Reduced Mobility, swllow function and self care secondary to left lateral medullary infarct, Wallenberg type syndrome, course complicated by multiple medical issues and prolonged hospital stay  Continue CIR 2. Antithrombotics: -DVT/anticoagulation:Pharmaceutical:Lovenox -antiplatelet therapy: ASA.  Plan to resume Plavix 3. Pain Management:n/A 4. Mood/delirium:  -antipsychotic agents: continues to demonstrate HS confusion.      increased seroquel and adjust timing to 2100. Have stopped am dose  -celexa added for anxiety  5.  Neuropsych: This patientis not fullycapable of making decisions onhisown behalf. 6. Skin/Wound Care:Routine pressure relief measures. 7. Fluids/Electrolytes/Nutrition:  BMP within acceptable range on 9/14 8. T2DM: Hgb A1c-10.6. Now on lantus with SSI for elevated BS. Continue to monitor BS every 4 hours.   -improving control.  lantus  33u CBG (last 3)  Recent Labs    07/05/19 0003 07/05/19 0326 07/05/19 0910  GLUCAP 94 96 92   Dextrose IVF DC'd  Slightly labile, but relatively controlled on 9/16 9. Recurrent PNA--H flu on 9/7: Has been on prolonged antibiotics--narrowed to rocephin on 9/8-9/12  -afebrile currently  -9/10 CXR without acute disease. 10. Respiratory failure s/p trach: Continue 28 % ATC.   -PMV during day  -TID robinul to help manage secretions.  -downsized to #4 trach 9/10. Should help some irritation of airway.   -has significant OSA. Will need CPAP when decannulated. May still be able to decannulate prior discharge but would like to see secretions a bit better first.   -consider plugging trials this week depending upon how things go 11. Tachycardia:   Improving  12. Severe dysphagia: severe pharyngeal dysphagia   -appreciate IR follow up, PEG placed on 9/15  -resume tube feeds  -convert to bolus schedule eventually 13. Brief unresponsive episodes none repeorted for 24 h  ?seizures vs vasovagal events related to suctioning  -EEG performed and does not demonstrate seizure activity  -hold on anticonvulsant at this time  Continue to monitor  14.  Morbid obesity  Encourage weight loss  LOS: 8 days A FACE TO FACE EVALUATION WAS PERFORMED  Shyna Duignan Karis Juba 07/05/2019, 9:48 AM

## 2019-07-06 ENCOUNTER — Inpatient Hospital Stay (HOSPITAL_COMMUNITY): Payer: Medicare Other | Admitting: Speech Pathology

## 2019-07-06 ENCOUNTER — Inpatient Hospital Stay (HOSPITAL_COMMUNITY): Payer: Medicare Other | Admitting: Occupational Therapy

## 2019-07-06 ENCOUNTER — Inpatient Hospital Stay (HOSPITAL_COMMUNITY): Payer: Medicare Other

## 2019-07-06 LAB — GLUCOSE, CAPILLARY
Glucose-Capillary: 103 mg/dL — ABNORMAL HIGH (ref 70–99)
Glucose-Capillary: 132 mg/dL — ABNORMAL HIGH (ref 70–99)
Glucose-Capillary: 139 mg/dL — ABNORMAL HIGH (ref 70–99)
Glucose-Capillary: 159 mg/dL — ABNORMAL HIGH (ref 70–99)
Glucose-Capillary: 168 mg/dL — ABNORMAL HIGH (ref 70–99)

## 2019-07-06 MED ORDER — PRO-STAT SUGAR FREE PO LIQD
30.0000 mL | Freq: Two times a day (BID) | ORAL | Status: DC
  Administered 2019-07-06 – 2019-07-17 (×22): 30 mL
  Filled 2019-07-06 (×21): qty 30

## 2019-07-06 MED ORDER — OSMOLITE 1.5 CAL PO LIQD
474.0000 mL | Freq: Three times a day (TID) | ORAL | Status: DC
  Administered 2019-07-06 – 2019-07-12 (×18): 474 mL
  Filled 2019-07-06 (×18): qty 474

## 2019-07-06 NOTE — Progress Notes (Signed)
Patient's trach was capped per MD order. Patient is tolerating trach capping well at this time & he denies any shortness of breath. Patient was placed on 2L [place] with SAT's of 96%.

## 2019-07-06 NOTE — Progress Notes (Signed)
Physical Therapy Weekly Progress Note  Patient Details  Name: Michael Bright MRN: [id number] Date of Birth: [birth year]  Beginning of progress report period: June 28, 2019 End of progress report period: July 06, 2019   Patient has met 1 of 4 short term goals. Pt is making good progress overall with functional mobility requiring overall min assist using RW for transfers and gait consistently about 50', up to about 100' but low endurance. Pt still demonstrates decreased postural control with L lateral and posterior bias though this is improving (more apparent with fatigue) and pt more aware. Utilizing aircast at this point with shoes during gait due to L ankle instability (tendency for inversion). Pt continues to require +2 assist at times with gait due to O2 management and w/c follow though this is improving due to increased tolerance for gait distance. Planning to d/c home on 9/26 with wife and son providing assist. Will require family education prior to discharge.   Patient continues to demonstrate the following deficits muscle weakness and muscle joint tightness, decreased cardiorespiratoy endurance and decreased oxygen support, ataxia and decreased coordination, decreased awareness, decreased safety awareness and decreased memory and decreased sitting balance, decreased standing balance, decreased postural control, hemiplegia and decreased balance strategies and therefore will continue to benefit from skilled PT intervention to increase functional independence with mobility.  Patient progressing toward long term goals..  Continue plan of care.  PT Short Term Goals Week 1:  PT Short Term Goal 1 (Week 1): Pt will ambulate 50 ft with LRAD & CGA. PT Short Term Goal 1 - Progress (Week 1): Progressing toward goal PT Short Term Goal 2 (Week 1): Pt will complete bed mobility without hospital bed features with CGA. PT Short Term Goal 2 - Progress (Week 1): Met PT Short Term Goal 3 (Week 1): Pt  will complete all transfers with CGA & LRAD. PT Short Term Goal 3 - Progress (Week 1): Progressing toward goal PT Short Term Goal 4 (Week 1): Pt will negotiate 4 steps with B rails & min assist for strengthening. PT Short Term Goal 4 - Progress (Week 1): Progressing toward goal Week 2:  PT Short Term Goal 1 (Week 2): Pt will be able to perform functional transfers with CGA PT Short Term Goal 2 (Week 2): Pt will be able to gait x 100' with CGA PT Short Term Goal 3 (Week 2): Pt will be able to perform 4 stairs with rails with min assist for functional strengthening  Skilled Therapeutic Interventions/Progress Updates:  Community reintegration;Ambulation/gait training;DME/adaptive equipment instruction;Neuromuscular re-education;Psychosocial support;Stair training;UE/LE Strength taining/ROM;Wheelchair propulsion/positioning;UE/LE Coordination activities;Therapeutic Activities;Skin care/wound management;Pain management;Functional electrical stimulation;Discharge planning;Balance/vestibular training;Cognitive remediation/compensation;Disease management/prevention;Functional mobility training;Splinting/orthotics;Patient/family education;Therapeutic Exercise;Visual/perceptual remediation/compensation   Therapy Documentation Precautions:  Precautions Precautions: Fall, Other (comment) Precaution Comments: PEG, watch BP, HR & O2, PMV with therapies, trach collar Required Braces or Orthoses: Other Brace Other Brace: aircast on LLE during gait Restrictions Weight Bearing Restrictions: No   Canary Brim Ivory Broad, PT, DPT, CBIS  07/06/2019, 11:43 AM

## 2019-07-06 NOTE — Progress Notes (Signed)
Speech Language Pathology Daily Session Note  Patient Details  Name: Michael Bright MRN: [id number] Date of Birth: Oct 08, 1956  Today's Date: 07/06/2019 SLP Individual Time: 1505-6979 SLP Individual Time Calculation (min): 55 min  Short Term Goals: Week 2: SLP Short Term Goal 1 (Week 2): Pt will demonstrate tolerance of PMSV in 60 minute intervals with vitals signs remaining WFL. SLP Short Term Goal 2 (Week 2): Pt will preform throat clear and swallow to reduce vocal qaulity wetness with min A verbal cues. SLP Short Term Goal 3 (Week 2): Pt will consume ice chip trials to preserve swallow function with moderate s/s aspiration with overall supervision level verbal cues. SLP Short Term Goal 4 (Week 2): Pt will complete semi-complex problem solving tasks with min A verbal cues. SLP Short Term Goal 5 (Week 2): Pt will utilizie memory compensatory strategies to recall new, daily information with min A verbal cues.  Skilled Therapeutic Interventions: Skilled treatment session focused on speech and dysphagia goals. PMSV was donned for ~55 minutes with all vitals remaining WFL. Oral care was performed via the suction toothbrush and patient required extra time and supervision level verbal cues for use of effortful swallows with lemon glycerin swab sticks. Patient initiated a swallow and utilized multiples swallows in 100% of trials with decreased overt s/s of aspiraiton this session.  PMSV was expelled from trach hub X 3 due to secretions with inability to expectorate them orally. Patient left upright in bed with alarm on and RN present. Continue with current plan of care.      Pain Pain in left shoulder-RN administered medications   Therapy/Group: Individual Therapy  Letica Giaimo 07/06/2019, 3:18 PM

## 2019-07-06 NOTE — Progress Notes (Signed)
[place] PHYSICAL MEDICINE & REHABILITATION PROGRESS NOTE   Subjective/Complaints: Had a pretty good night. Was able to sleep. Belly a little sore  ROS: Patient denies fever, rash, sore throat, blurred vision, nausea, vomiting, diarrhea, cough, shortness of breath or chest pain, joint or back pain, headache, or mood change.   Objective:   Ir Gastrostomy Tube Mod Sed  Result Date: 07/04/2019 INDICATION: 63 year old male with recent cerebral infarct and dysphagia. He requires percutaneous gastrostomy tube placement. EXAM: Fluoroscopically guided placement of percutaneous pull-through gastrostomy tube Interventional Radiologist:  Criselda Peaches, MD MEDICATIONS: 2 g Ancef; 1 mg glucagon antibiotics were administered within 1 hour of the procedure. ANESTHESIA/SEDATION: Versed 1 mg IV; Fentanyl 50 mcg IV Moderate Sedation Time:  15 minutes The patient was continuously monitored during the procedure by the interventional radiology nurse under my direct supervision. CONTRAST:  1mL OMNIPAQUE IOHEXOL 300 MG/ML  SOLN FLUOROSCOPY TIME:  Fluoroscopy Time: 1 minutes 26 seconds (27 mGy). COMPLICATIONS: None immediate. PROCEDURE: Informed written consent was obtained from the patient after a thorough discussion of the procedural risks, benefits and alternatives. All questions were addressed. Maximal Sterile Barrier Technique was utilized including caps, mask, sterile gowns, sterile gloves, sterile drape, hand hygiene and skin antiseptic. A timeout was performed prior to the initiation of the procedure. Maximal barrier sterile technique utilized including caps, mask, sterile gowns, sterile gloves, large sterile drape, hand hygiene, and chlorhexadine skin prep. An angled catheter was advanced over a wire under fluoroscopic guidance through the nose, down the esophagus and into the body of the stomach. The stomach was then insufflated with several 100 ml of air. Fluoroscopy confirmed location of the gastric  bubble, as well as inferior displacement of the barium stained colon. Under direct fluoroscopic guidance, a single T-tack was placed, and the anterior gastric wall drawn up against the anterior abdominal wall. Percutaneous access was then obtained into the mid gastric body with an 18 gauge sheath needle. Aspiration of air, and injection of contrast material under fluoroscopy confirmed needle placement. An Amplatz wire was advanced in the gastric body and the access needle exchanged for a 9-French vascular sheath. A snare device was advanced through the vascular sheath and an Amplatz wire advanced through the angled catheter. The Amplatz wire was successfully snared and this was pulled up through the esophagus and out the mouth. A 20-French Alinda Dooms MIC-PEG tube was then connected to the snare and pulled through the mouth, down the esophagus, into the stomach and out to the anterior abdominal wall. Hand injection of contrast material confirmed intragastric location. The T-tack retention suture was then cut. The pull through peg tube was then secured with the external bumper and capped. The patient will be observed for several hours with the newly placed tube on low wall suction to evaluate for any post procedure complication. The patient tolerated the procedure well, there is no immediate complication. IMPRESSION: Successful placement of a 20 French pull through gastrostomy tube. Electronically Signed   By: Jacqulynn Cadet M.D.   On: 07/04/2019 17:32   No results for input(s): WBC, HGB, HCT, PLT in the last 72 hours. No results for input(s): NA, K, CL, CO2, GLUCOSE, BUN, CREATININE, CALCIUM in the last 72 hours.  Intake/Output Summary (Last 24 hours) at 07/06/2019 0945 Last data filed at 07/06/2019 0653 Gross per 24 hour  Intake -  Output 2850 ml  Net -2850 ml     Physical Exam: Vital Signs Blood pressure 132/73, pulse 90, temperature 98.7 F (37.1  C), resp. rate 18, height 6\' 1"  (1.854 m),  weight (!) 142.7 kg, SpO2 95 %. Constitutional: No distress . Vital signs reviewed. HEENT: EOMI, oral membranes moist Neck: supple. Still thick secretions around trach Cardiovascular: RRR without murmur. No JVD    Respiratory: CTA Bilaterally without wheezes or rales. Normal effort    GI: BS +, PEG site clean, sl tender, non-distended  Skin: Warm and dry.  Intact. Psych: Normal mood.  Normal behavior. Musc: No edema.  No tenderness. Neurological: Alert and oriented. Normal language. Strong voice and better cough today Motor: 4 to 4+/5 throughout   Assessment/Plan: 1. 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  Physiatrist is providing close team supervision and 24 hour management of active medical problems listed below.  Physiatrist and rehab team continue to assess barriers to discharge/monitor patient progress toward functional and medical goals  Care Tool:  Bathing    Body parts bathed by patient: Right arm, Left arm, Abdomen, Chest, Right upper leg, Left upper leg, Right lower leg, Left lower leg, Face, Front perineal area, Buttocks   Body parts bathed by helper: Buttocks     Bathing assist Assist Level: Contact Guard/Touching assist     Upper Body Dressing/Undressing Upper body dressing   What is the patient wearing?: Pull over shirt    Upper body assist Assist Level: Supervision/Verbal cueing    Lower Body Dressing/Undressing Lower body dressing      What is the patient wearing?: Pants, Underwear/pull up     Lower body assist Assist for lower body dressing: Minimal Assistance - Patient > 75%     Toileting Toileting    Toileting assist Assist for toileting: Moderate Assistance - Patient 50 - 74%     Transfers Chair/bed transfer  Transfers assist  Chair/bed transfer activity did not occur: Safety/medical concerns  Chair/bed transfer assist  level: Minimal Assistance - Patient > 75%     Locomotion Ambulation   Ambulation assist   Ambulation activity did not occur: Safety/medical concerns  Assist level: Minimal Assistance - Patient > 75% Assistive device: Walker-rolling Max distance: 40   Walk 10 feet activity   Assist  Walk 10 feet activity did not occur: Safety/medical concerns  Assist level: Minimal Assistance - Patient > 75% Assistive device: Walker-rolling, Orthosis   Walk 50 feet activity   Assist Walk 50 feet with 2 turns activity did not occur: Safety/medical concerns         Walk 150 feet activity   Assist Walk 150 feet activity did not occur: Safety/medical concerns         Walk 10 feet on uneven surface  activity   Assist Walk 10 feet on uneven surfaces activity did not occur: Safety/medical concerns         Wheelchair     Assist Will patient use wheelchair at discharge?: (TBD) Type of Wheelchair: Manual    Wheelchair assist level: Supervision/Verbal cueing Max wheelchair distance: 50    Wheelchair 50 feet with 2 turns activity    Assist    Wheelchair 50 feet with 2 turns activity did not occur: Safety/medical concerns   Assist Level: Supervision/Verbal cueing   Wheelchair 150 feet activity     Assist          Blood pressure 132/73, pulse 90, temperature 98.7 F (37.1 C), resp. rate 18, height 6\' 1"  (1.854 m), weight (!) 142.7 kg, SpO2 95 %.  Medical  Problem List and Plan: 1. Reduced Mobility, swllow function and self care secondary to left lateral medullary infarct, Wallenberg type syndrome, course complicated by multiple medical issues and prolonged hospital stay  Continue CIR 2. Antithrombotics: -DVT/anticoagulation:Pharmaceutical:Lovenox -antiplatelet therapy: ASA.   resume Plavix 3. Pain Management:n/A 4. Mood/delirium:  -antipsychotic agents: continues to demonstrate HS confusion.      increased seroquel and  adjusted timing to 2100 with improvement  -celexa added for anxiety  5. Neuropsych: This patientis not fullycapable of making decisions onhisown behalf. 6. Skin/Wound Care:Routine pressure relief measures. 7. Fluids/Electrolytes/Nutrition:  BMP within acceptable range on 9/14 8. T2DM: Hgb A1c-10.6. Now on lantus with SSI for elevated BS. Continue to monitor BS every 4 hours.   -improving control. continue  lantus  33u CBG (last 3)  Recent Labs    07/05/19 2006 07/06/19 0014 07/06/19 0609  GLUCAP 155* 132* 103*     9. Recurrent PNA--H flu on 9/7: Has been on prolonged antibiotics--narrowed to rocephin on 9/8-9/12  -afebrile currently  -9/10 CXR without acute disease. 10. Respiratory failure s/p trach: Continue 28 % ATC.   -PMV during day  -TID robinul to help manage secretions.  -downsized to #4 trach 9/10. Should help some irritation of airway.   -has significant OSA. Will need CPAP when decannulated. May still be able to decannulate prior discharge but would like to see secretions a bit better first.   -will try plugging during the day to encourage mobilization of secretions 11. Tachycardia:   Improving  12. Severe dysphagia: severe pharyngeal dysphagia   -appreciate IR follow up, PEG placed on 9/15  -resumed tube feeds  -convert to bolus schedule per RD 13. Brief unresponsive episodes none repeorted for 24 h  ?seizures vs vasovagal events related to suctioning  -EEG performed and does not demonstrate seizure activity  -hold on anticonvulsant at this time  Continue to monitor  14.  Morbid obesity  Encourage weight loss  LOS: 9 days A FACE TO FACE EVALUATION WAS PERFORMED  Ranelle OysterZachary T Clova Morlock 07/06/2019, 9:45 AM

## 2019-07-06 NOTE — Progress Notes (Signed)
Physical Therapy Session Note  Patient Details  Name: Michael Bright MRN: [id number] Date of Birth: 03-13-1956  Today's Date: 07/06/2019 PT Individual Time: 1000-1110 PT Individual Time Calculation (min): 70 min   Short Term Goals: Week 1:  PT Short Term Goal 1 (Week 1): Pt will ambulate 50 ft with LRAD & CGA. PT Short Term Goal 2 (Week 1): Pt will complete bed mobility without hospital bed features with CGA. PT Short Term Goal 3 (Week 1): Pt will complete all transfers with CGA & LRAD. PT Short Term Goal 4 (Week 1): Pt will negotiate 4 steps with B rails & min assist for strengthening.  Skilled Therapeutic Interventions/Progress Updates:  Session focused on functional bed mobility retraining, dynamic sitting balance and postural control during dressing EOB (shoes, socks, aircast total assist and donned shirt with min assist), gait training, toilet transfers and dynamic balance during hygiene and clothing management, overall endurance/activity tolerance, and functional transfers. Pt tolerated PMSV throughout session without issue except frequently coughing off due to secretion production - requires suction from PT to manage. O2 remained WFL throughout with activity. Pt requires overall min assist progressing to CGA for basic sit <> stands with RW with cues for technique, hand placement, and facilitation for anterior weightshift. Pt able to perform toilet transfers, standing balance, and short distance gait with overall min assist and cues for safe management of RW as pt tendency to keep body outside of BOS during obstacle negotiation. Pt performed hygiene himself in standing with CGA for balance. Pt able to gait x 70' and x 100' with seated rest breaks due to fatigue in LLE. During gait today, (with aircast) did not note any ankle instability and pt denies any feelings of instability, just reports weakness. Pt with cues for wider BOS and increased step length on L as well as maintaining upright  posture. During rest break, pt performed 10 reps of LLE LAQ with 5 sec hold for functional strengthening.   Therapy Documentation Precautions:  Precautions Precautions: Fall, Other (comment) Precaution Comments: PEG, watch BP, HR & O2, PMV with therapies, trach collar Required Braces or Orthoses: Other Brace Other Brace: aircast on LLE during gait Restrictions Weight Bearing Restrictions: No   Vital Signs: Pulse Rate: 100-122 bpm with activity SpO2: 90-96 with activity O2 Device: Tracheostomy Collar O2 Flow Rate (L/min): 3 L/min (convertor) FiO2 (%): 28 % Pain: Reported some L shoulder pain - PA addressed and pt premedicated.    Therapy/Group: Individual Therapy  Canary Brim Ivory Broad, PT, DPT, CBIS  07/06/2019, 11:27 AM

## 2019-07-06 NOTE — Progress Notes (Signed)
Occupational Therapy Session Note  Patient Details  Name: Michael Bright MRN: [id number] Date of Birth: 03-08-1956  Today's Date: 07/06/2019 OT Individual Time: 1415-1530 OT Individual Time Calculation (min): 75 min    Short Term Goals: Week 2:  OT Short Term Goal 1 (Week 2): Pt will don pants with CGA OT Short Term Goal 2 (Week 2): Pt will require no more than 2 rest breaks in 30 min to manage secretions without suction OT Short Term Goal 3 (Week 2): Pt will complete standing level grooming task with all VSS for > 3 minutes  Skilled Therapeutic Interventions/Progress Updates:    Patient in bed, ready for therapy session.  He is pleasant, cooperative and aware of needs.  2L O2 via [place], trach capped for session - O2 sat 98-99% .  Bed mobility:  SSP to/from supine with min a.  SPT and short distance ambulation with RW to/from bed, w/c and mat table CG/min A.  Completed left shoulder mobilization activities, neck/trunk ROM, scapular mobility activities, posture and pelvic ROM activities - patient with good tolerance of unsupported sitting for 40 minutes.  kinesiotape applied to left shoulder - to facilitate scapular retraction and inhibit anterior deltoid.  Patient notes improved mobility and decreased pain at shoulder.  Patient returned to bed at close of session, bed alarm set, O2 set on wall system, suction and O2 monitor, call bell in reach.    Therapy Documentation Precautions:  Precautions Precautions: Fall, Other (comment) Precaution Comments: PEG, watch BP, HR & O2, PMV with therapies, trach collar Required Braces or Orthoses: Other Brace Other Brace: aircast on LLE during gait Restrictions Weight Bearing Restrictions: No General:   Vital Signs: Therapy Vitals Temp: 98.4 F (36.9 C) Temp Source: Oral Pulse Rate: 95 Resp: 18 BP: 123/86 Patient Position (if appropriate): Lying Oxygen Therapy SpO2: 100 % O2 Device: Nasal Cannula O2 Flow Rate (L/min): 2 L/min Pain: Pain  Assessment Pain Scale: 0-10 Pain Score: 2  Pain Location: Shoulder Pain Orientation: Left Pain Descriptors / Indicators: Tender Pain Intervention(s): Repositioned Other Treatments:     Therapy/Group: Individual Therapy  Carlos Levering 07/06/2019, 3:50 PM

## 2019-07-06 NOTE — Plan of Care (Signed)
  Problem: Consults Goal: RH STROKE PATIENT EDUCATION Description: See Patient Education module for education specifics  Outcome: Progressing   Problem: RH BOWEL ELIMINATION Goal: RH STG MANAGE BOWEL WITH ASSISTANCE Description: STG Manage Bowel with  mod I Assistance. Outcome: Progressing   Problem: RH BLADDER ELIMINATION Goal: RH STG MANAGE BLADDER WITH ASSISTANCE Description: STG Manage Bladder With min Assistance Outcome: Progressing   Problem: RH SAFETY Goal: RH STG ADHERE TO SAFETY PRECAUTIONS W/ASSISTANCE/DEVICE Description: STG Adhere to Safety Precautions With cues/reminders Assistance/Device. Outcome: Progressing   Problem: RH PAIN MANAGEMENT Goal: RH STG PAIN MANAGED AT OR BELOW PT'S PAIN GOAL Description: At or below level 5 Outcome: Progressing   Problem: RH KNOWLEDGE DEFICIT Goal: RH STG INCREASE KNOWLEDGE OF DIABETES Description: Pt and wife will be able to manage diabetes using handouts for medication, dietary restrictions and cbg monitoring independently Outcome: Progressing Goal: RH STG INCREASE KNOWLEDGE OF HYPERTENSION Description: Pt and wife will be able to manage HTN using handouts for medication, dietary restrictions and monitoring independently Outcome: Progressing Goal: RH STG INCREASE KNOWLEDGE OF DYSPHAGIA/FLUID INTAKE Outcome: Progressing Goal: RH STG INCREASE KNOWLEGDE OF HYPERLIPIDEMIA Description: Pt and wife will be able to manage HLD using handouts for medication, dietary restrictions and monitoring independently Outcome: Progressing Goal: RH STG INCREASE KNOWLEDGE OF STROKE PROPHYLAXIS Description: Pt and wife will be able to manage risks for secondary stroke using handouts for medication, dietary restrictions independently Outcome: Progressing   

## 2019-07-07 ENCOUNTER — Inpatient Hospital Stay (HOSPITAL_COMMUNITY): Payer: Medicare Other | Admitting: Occupational Therapy

## 2019-07-07 ENCOUNTER — Inpatient Hospital Stay (HOSPITAL_COMMUNITY): Payer: Medicare Other

## 2019-07-07 ENCOUNTER — Inpatient Hospital Stay (HOSPITAL_COMMUNITY): Payer: Medicare Other | Admitting: Speech Pathology

## 2019-07-07 LAB — GLUCOSE, CAPILLARY
Glucose-Capillary: 122 mg/dL — ABNORMAL HIGH (ref 70–99)
Glucose-Capillary: 143 mg/dL — ABNORMAL HIGH (ref 70–99)
Glucose-Capillary: 209 mg/dL — ABNORMAL HIGH (ref 70–99)
Glucose-Capillary: 121 mg/dL — ABNORMAL HIGH (ref 70–99)

## 2019-07-07 NOTE — Progress Notes (Signed)
Speech Language Pathology Daily Session Note  Patient Details  Name: Michael Bright MRN: [id number] Date of Birth: 06-04-1956  Today's Date: 07/07/2019 SLP Individual Time: 2706-2376 SLP Individual Time Calculation (min): 42 min  Short Term Goals: Week 2: SLP Short Term Goal 1 (Week 2): Pt will demonstrate tolerance of PMSV in 60 minute intervals with vitals signs remaining WFL. SLP Short Term Goal 2 (Week 2): Pt will preform throat clear and swallow to reduce vocal qaulity wetness with min A verbal cues. SLP Short Term Goal 3 (Week 2): Pt will consume ice chip trials to preserve swallow function with moderate s/s aspiration with overall supervision level verbal cues. SLP Short Term Goal 4 (Week 2): Pt will complete semi-complex problem solving tasks with min A verbal cues. SLP Short Term Goal 5 (Week 2): Pt will utilizie memory compensatory strategies to recall new, daily information with min A verbal cues.  Skilled Therapeutic Interventions:  Pt was seen for skilled ST targeting goals for dysphagia.  Pt had just received breathing treatment from respiratory therapy upon therapist's arrival but was still requesting deep suctioning to manage secretions.  A small amount of secretions were suctioned from trach hub and RN was made aware of further suctioning needs.  Per report, pt needs a smaller size suction catheter due to vagal responses with use of  larger size catheters and the correct size was unavailable during today's session.  Therefore trach cap was left off but PMSV was placed for the duration of today's session with excellent toleration (no complaints or s/s of discomfort, no significant changes in vital signs).  Pt completed 5 sets of 5 repetitions of the effortful swallow exercise, 10 repetitions of holding tongue to the roof of his mouth for 5 second intervals, and 10 repetitions of tongue protrusion against resistance/tongue depressor with mod I.  Effort level was reported to be minimal  and therapist encouraged pt to do exercises to the point of fatigue throughout the day to maximize treatment effects.  Pt still does not yet feel comfortable trialing ice chips at this time.  Pt was left in wheelchair with all needs within reach.  Continue per current plan of care.    Pain Pain Assessment Pain Scale: 0-10 Pain Score: 9  Pain Location: Abdomen(PEG tube site) Pain Descriptors / Indicators: Aching Pain Intervention(s): RN made aware  Therapy/Group: Individual Therapy  Keyoni Lapinski, Selinda Orion 07/07/2019, 10:14 AM

## 2019-07-07 NOTE — Progress Notes (Signed)
Physical Therapy Session Note  Patient Details  Name: Michael Bright MRN: [id number] Date of Birth: September 19, 1956  Today's Date: 07/07/2019 PT Individual Time: 0800-0900 PT Individual Time Calculation (min): 60 min   Short Term Goals: Week 2:  PT Short Term Goal 1 (Week 2): Pt will be able to perform functional transfers with CGA PT Short Term Goal 2 (Week 2): Pt will be able to gait x 100' with CGA PT Short Term Goal 3 (Week 2): Pt will be able to perform 4 stairs with rails with min assist for functional strengthening  Skilled Therapeutic Interventions/Progress Updates:    Received pt supine in bed. Pt initially c/o abdominal discomfort and feeling like he needed to belch. RN was called to assist pt with this. Today's session focused on improving endurance and activity tolerance as well as bed mobility including static and dynamic sitting balance, and trunk control EOB during dressing. Pt required total assist to don Aircast, socks, and shoes but min assist to don shirt. Pt continues to cough off PMSV due to secretion production and requires PT assistance to manage, but overall tolerates it well. Pt continues to require min assist for sit<> stand transfers using RW as well as verbal cueing for hand placement on chair and RW. Pt navigated 4 steps x 2 trials using bilateral rails with mod assist and verbal cueing for step sequence and foot placement. O2 sat was 85% and HR 112bpm after trial 1 of stairs but returned to 94% after a 2 minute seated rest break. O2 sat was 94% and HR 114bpm after trial 2 of stairs. Pt tolerated today's treatment session well.   Therapy Documentation Precautions:  Precautions Precautions: Fall, Other (comment) Precaution Comments: PEG, watch BP, HR & O2, PMV with therapies, trach collar Required Braces or Orthoses: Other Brace Other Brace: aircast on LLE during gait Restrictions Weight Bearing Restrictions: No General:   Vital Signs:  Pain:  Pt stated that his L  shoulder pain was better after working with OT yesterday, but increased slightly with mobility during today's session. Pt did not state pain number. Repositioning was done to alleviate pain.     Therapy/Group: Individual Therapy  Alfonse Alpers PT, DPT   Lars Masson, PT, DPT, CBIS 12:30 PM   07/07/2019, 9:56 AM

## 2019-07-07 NOTE — Progress Notes (Signed)
[place] PHYSICAL MEDICINE & REHABILITATION PROGRESS NOTE   Subjective/Complaints: No new complaints. Had a good day with therapy yesterday. No problems reported overnight  ROS: Patient denies fever, rash, sore throat, blurred vision, nausea, vomiting, diarrhea, cough, shortness of breath or chest pain, joint or back pain, headache, or mood change.    Objective:   No results found. No results for input(s): WBC, HGB, HCT, PLT in the last 72 hours. No results for input(s): NA, K, CL, CO2, GLUCOSE, BUN, CREATININE, CALCIUM in the last 72 hours.  Intake/Output Summary (Last 24 hours) at 07/07/2019 0922 Last data filed at 07/07/2019 0523 Gross per 24 hour  Intake 600 ml  Output 1250 ml  Net -650 ml     Physical Exam: Vital Signs Blood pressure 123/68, pulse 93, temperature 98 F (36.7 C), temperature source Oral, resp. rate 16, height 6\' 1"  (1.854 m), weight (!) 146.2 kg, SpO2 95 %. Constitutional: No distress . Vital signs reviewed. HEENT: EOMI, oral membranes moist Neck: supple Cardiovascular: RRR without murmur. No JVD    Respiratory: CTA Bilaterally without wheezes or rales. Normal effort    GI: BS +, sl abd tenderness near PEG, PEG site clean, non-distended  Skin: Warm and dry.  Intact. Psych: pleasant and cooperative. Musc: No edema.  No tenderness. Neurological: Alert and oriented. Normal language. Fair insight. Motor: 4 to 4+/5 throughout fairly symmetrical.   Assessment/Plan: 1. 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  Physiatrist is providing close team supervision and 24 hour management of active medical problems listed below.  Physiatrist and rehab team continue to assess barriers to discharge/monitor patient progress toward functional and medical goals  Care Tool:  Bathing    Body parts bathed by patient: Right arm, Left arm, Abdomen, Chest,  Right upper leg, Left upper leg, Right lower leg, Left lower leg, Face, Front perineal area, Buttocks   Body parts bathed by helper: Buttocks     Bathing assist Assist Level: Contact Guard/Touching assist     Upper Body Dressing/Undressing Upper body dressing   What is the patient wearing?: Pull over shirt    Upper body assist Assist Level: Minimal Assistance - Patient > 75%    Lower Body Dressing/Undressing Lower body dressing      What is the patient wearing?: Pants, Underwear/pull up     Lower body assist Assist for lower body dressing: Minimal Assistance - Patient > 75%     Toileting Toileting    Toileting assist Assist for toileting: Minimal Assistance - Patient > 75%     Transfers Chair/bed transfer  Transfers assist  Chair/bed transfer activity did not occur: Safety/medical concerns  Chair/bed transfer assist level: Minimal Assistance - Patient > 75%     Locomotion Ambulation   Ambulation assist   Ambulation activity did not occur: Safety/medical concerns  Assist level: 2 helpers(+2 for WC follow and O2 transfer only; Min A otherwise) Assistive device: Walker-rolling Max distance: 155ft   Walk 10 feet activity   Assist  Walk 10 feet activity did not occur: Safety/medical concerns  Assist level: Minimal Assistance - Patient > 75% Assistive device: Walker-rolling   Walk 50 feet activity   Assist Walk 50 feet with 2 turns activity did not occur: Safety/medical concerns  Assist level: 2 helpers Assistive device: Walker-rolling    Walk 150 feet activity   Assist Walk 150 feet activity did not occur: Safety/medical concerns  Walk 10 feet on uneven surface  activity   Assist Walk 10 feet on uneven surfaces activity did not occur: Safety/medical concerns         Wheelchair     Assist Will patient use wheelchair at discharge?: (TBD) Type of Wheelchair: Manual    Wheelchair assist level: Supervision/Verbal  cueing Max wheelchair distance: 50    Wheelchair 50 feet with 2 turns activity    Assist    Wheelchair 50 feet with 2 turns activity did not occur: Safety/medical concerns   Assist Level: Supervision/Verbal cueing   Wheelchair 150 feet activity     Assist          Blood pressure 123/68, pulse 93, temperature 98 F (36.7 C), temperature source Oral, resp. rate 16, height 6\' 1"  (1.854 m), weight (!) 146.2 kg, SpO2 95 %.  Medical Problem List and Plan: 1. Reduced Mobility, swllow function and self care secondary to left lateral medullary infarct, Wallenberg type syndrome, course complicated by multiple medical issues and prolonged hospital stay  Continue CIR 2. Antithrombotics: -DVT/anticoagulation:Pharmaceutical:Lovenox -antiplatelet therapy: ASA.   resume Plavix 3. Pain Management:n/A 4. Mood/delirium:  -antipsychotic agents: continues to demonstrate HS confusion.      increased seroquel and adjusted timing to 2100 with improvement  -celexa added for anxiety  5. Neuropsych: This patientis not fullycapable of making decisions onhisown behalf. 6. Skin/Wound Care:Routine pressure relief measures. 7. Fluids/Electrolytes/Nutrition:  BMP within acceptable range on 9/14 8. T2DM: Hgb A1c-10.6. Now on lantus with SSI for elevated BS. Continue to monitor BS every 4 hours.   -improving control. continue  lantus  33u CBG (last 3)  Recent Labs    07/06/19 1752 07/06/19 2150 07/07/19 0627  GLUCAP 159* 139* 122*     9. Recurrent PNA--H flu on 9/7: Has been on prolonged antibiotics--narrowed to rocephin on 9/8-9/12  -afebrile currently  -9/10 CXR without acute disease. 10. Respiratory failure s/p trach/OSA:     - TID robinul to help manage secretions.  -downsized to #4 trach 9/10.     -has significant OSA. Will need CPAP when decannulated. May still be able to decannulate prior discharge but would like to see secretions a bit better  first.   -tolerated plugging of trach well yesterday.  -continue day time of plugging of trach. Will d/w CCM regarding decannulation and initiation of CPAP 11. Tachycardia:   Improving  12. Severe dysphagia: severe pharyngeal dysphagia   -appreciate IR follow up, PEG placed on 9/15  -tolerating bolus feeds 13. Brief unresponsive episodes none reported for several days now -likely vasovagal events related to trach suctioning  -EEG performed and did not demonstrate seizure activity  -Continue to monitor  14.  Morbid obesity  Have reviewed  weight loss  LOS: 10 days A FACE TO FACE EVALUATION WAS PERFORMED  Ranelle OysterZachary T Murl Zogg 07/07/2019, 9:22 AM

## 2019-07-07 NOTE — Progress Notes (Signed)
Occupational Therapy Session Note  Patient Details  Name: Michael Bright MRN: [id number] Date of Birth: Jun 18, 1956  Today's Date: 07/07/2019 OT Individual Time: 1100-1155 OT Individual Time Calculation (min): 55 min    Short Term Goals: Week 1:  OT Short Term Goal 1 (Week 1): Pt will complete 1 grooming task in standing with HR > 120 bpm OT Short Term Goal 1 - Progress (Week 1): Met OT Short Term Goal 2 (Week 1): Pt will don shorts with min A OT Short Term Goal 2 - Progress (Week 1): Met OT Short Term Goal 3 (Week 1): Pt will complete toileting tasks with min A OT Short Term Goal 3 - Progress (Week 1): Met OT Short Term Goal 4 (Week 1): Pt will demonstrate improved safety awareness, requiring no more than min cueing during ADLs OT Short Term Goal 4 - Progress (Week 1): Met  Skilled Therapeutic Interventions/Progress Updates:    1:1 Pt just finishing being deep suctioned when arrived. Pt able to perform bathing and dressing sit to stand at the sink. Pt able to perform UB bathing with setup and LB bathing with only A for buttocks. Pt required A to doff shoes and doff dirty clothes. Pt able to thread new pants and pull them up with steadying A . Pt donned new, more structured shoes with total A - with air cast still donned. Pt able to perform self care tasks on RA with O2 sats >97% with PMSV donned.  Pt able to ambulated ~ 50 feet with RW in the hallway before asking to sit with min A with VC to widen BOS (espeically with left LE). Pt able to self correct. After rest break able to walk another 30 feet with a 180 turn with extra time before requesting to rest. Pt able to self propel the w/c with bilateral Les ~50 feet. Pt transferred with RW into the recliner to rest with LEs elevated with min A with RW.   Therapy Documentation Precautions:  Precautions Precautions: Fall, Other (comment) Precaution Comments: PEG, watch BP, HR & O2, PMV with therapies, trach collar Required Braces or Orthoses:  Other Brace Other Brace: aircast on LLE during gait Restrictions Weight Bearing Restrictions: No Pain:  Mild c/o left shoulder pain - allowed for rest breaks and encouraged pushing up with right UE with left UE up on RW.   Therapy/Group: Individual Therapy  [name] 07/07/2019, 3:11 PM

## 2019-07-08 ENCOUNTER — Inpatient Hospital Stay (HOSPITAL_COMMUNITY): Payer: Medicare Other | Admitting: Occupational Therapy

## 2019-07-08 ENCOUNTER — Inpatient Hospital Stay (HOSPITAL_COMMUNITY): Payer: Medicare Other

## 2019-07-08 DIAGNOSIS — D62 Acute posthemorrhagic anemia: Secondary | ICD-10-CM

## 2019-07-08 LAB — GLUCOSE, CAPILLARY
Glucose-Capillary: 117 mg/dL — ABNORMAL HIGH (ref 70–99)
Glucose-Capillary: 127 mg/dL — ABNORMAL HIGH (ref 70–99)
Glucose-Capillary: 92 mg/dL (ref 70–99)
Glucose-Capillary: 183 mg/dL — ABNORMAL HIGH (ref 70–99)

## 2019-07-08 NOTE — Progress Notes (Signed)
Speech Language Pathology Daily Session Note  Patient Details  Name: Michael Bright MRN: [id number] Date of Birth: 07/31/1956  Today's Date: 07/08/2019 SLP Individual Time: 7591-6384 SLP Individual Time Calculation (min): 45 min  Short Term Goals: Week 2: SLP Short Term Goal 1 (Week 2): Pt will demonstrate tolerance of PMSV in 60 minute intervals with vitals signs remaining WFL. SLP Short Term Goal 2 (Week 2): Pt will preform throat clear and swallow to reduce vocal qaulity wetness with min A verbal cues. SLP Short Term Goal 3 (Week 2): Pt will consume ice chip trials to preserve swallow function with moderate s/s aspiration with overall supervision level verbal cues. SLP Short Term Goal 4 (Week 2): Pt will complete semi-complex problem solving tasks with min A verbal cues. SLP Short Term Goal 5 (Week 2): Pt will utilizie memory compensatory strategies to recall new, daily information with min A verbal cues.  Skilled Therapeutic Interventions: Skilled ST services focused on swallow skills. Pt was uncapped upon entering room, SLP suction mucus pooling at trach hub and placed cap. Pt's O2 remained 96-94 without supplemental oxygen, SLP informed nursing staff throughout session of tolerance and HR with WFL. Pt demonstrated recall of swallow exercises with min A verbal cues. SLP facilitated oral care prior to effortful swallow exercises elicited with lemon glycerin swabs, pt demonstrated effortful swallow in 5/5 opportunities. Pt completed x10 lingual holds to roof of mouth followed with effortful swallow and lingual protrusions against resistance with tongue depressor held of 10 seconds x10 repetitions. SLP encouraged pt to continue exercises outside of therapy time until the point of fatigued, pt agreed. Pt demonstrated x7 coughs producing mucus with ability to exptorate via coral cavity in 2/7 opportunities, the rest required suction from trach hub after blowing off cap with force. SLP provided  education and pt returned demonstrated ability to take cap on/off trach hub with min A verbal cues. Pt was left in room with call bell within reach and bed alarm set. ST recommends to continue skilled ST services.      Pain Pain Assessment Pain Score: 0-No pain  Therapy/Group: Individual Therapy  Wardell Pokorski  [facility] 07/08/2019, 11:46 AM

## 2019-07-08 NOTE — Plan of Care (Signed)
  Problem: RH BLADDER ELIMINATION Goal: RH STG MANAGE BLADDER WITH ASSISTANCE Description: STG Manage Bladder With min Assistance Outcome: Not Progressing; in and out cath    

## 2019-07-08 NOTE — Progress Notes (Signed)
Occupational Therapy Session Note  Patient Details  Name: Michael Bright MRN: [id number] Date of Birth: 04-29-1956  Today's Date: 07/08/2019 OT Individual Time: 1300-1400 OT Individual Time Calculation (min): 60 min    Short Term Goals: Week 2:  OT Short Term Goal 1 (Week 2): Pt will don pants with CGA OT Short Term Goal 2 (Week 2): Pt will require no more than 2 rest breaks in 30 min to manage secretions without suction OT Short Term Goal 3 (Week 2): Pt will complete standing level grooming task with all VSS for > 3 minutes  Skilled Therapeutic Interventions/Progress Updates:    Pt greeted semi-reclined in bed agreeable to OT treatment session. Pt initially wanted to bathe and change clothes, but unable to locate clean underwear, so pt opted to just was UB and wanted to wait for wife to bring clean underwear. Pt came to sitting EOB with HOB elevated and close supervision. Pt needed OT assistance to don L air cast and shoes. Pt declined in try to walk to the sink 2/2 fatigue, but completed stand-pivot to the wc with min A. UB bathing/dressing completed at the sink with set-up A. Pt on RA with SpO2 at 95%. Pt brought to therapy gym and completed 5 mins x 2 on [employer] arm bike with 2 minute rest break in between sets. Worked on balance strategies and standing balance/endurance as well as L UE ROM with card matching task standing on foam block. Pt tolerated standing bouts of 5 minutes within activity with CGA for balance. Pt also able to reach forward and across midline with L UE to grasp cards. Pt returned to room and left seated in wc with chair alarm on, call bell in reach, and nursing administering medications.   Therapy Documentation Precautions:  Precautions Precautions: Fall, Other (comment) Precaution Comments: PEG, watch BP, HR & O2, PMV with therapies, trach collar Required Braces or Orthoses: Other Brace Other Brace: aircast on LLE during gait Restrictions Weight Bearing Restrictions:  No Pain: Pain Assessment Pain Score: 0-No pain   Therapy/Group: Individual Therapy  Valma Cava 07/08/2019, 1:23 PM

## 2019-07-08 NOTE — Progress Notes (Signed)
[place] PHYSICAL MEDICINE & REHABILITATION PROGRESS NOTE   Subjective/Complaints: Patient seen laying in bed this morning.  He states he slept well overnight.  He notes decreasing secretions.  ROS: Denies CP, SOB, N/V/D  Objective:   No results found. No results for input(s): WBC, HGB, HCT, PLT in the last 72 hours. No results for input(s): NA, K, CL, CO2, GLUCOSE, BUN, CREATININE, CALCIUM in the last 72 hours.  Intake/Output Summary (Last 24 hours) at 07/08/2019 0947 Last data filed at 07/08/2019 0643 Gross per 24 hour  Intake 1228 ml  Output 975 ml  Net 253 ml     Physical Exam: Vital Signs Blood pressure 121/80, pulse 90, temperature 98.1 F (36.7 C), temperature source Oral, resp. rate 18, height 6\' 1"  (1.854 m), weight (!) 140.7 kg, SpO2 97 %. Constitutional: No distress . Vital signs reviewed. HENT: Normocephalic.  Atraumatic. Neck: + Trach Eyes: EOMI. No discharge. Cardiovascular: No JVD. Respiratory: Normal effort.  No stridor. GI: Non-distended.  + PEG Skin: Warm and dry.  Intact. Psych: Normal mood.  Normal behavior. Musc: No edema in extremities.  No tenderness in extremities. Neurological: Alert Motor: 4+/5 throughout  Assessment/Plan: 1. 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  Physiatrist is providing close team supervision and 24 hour management of active medical problems listed below.  Physiatrist and rehab team continue to assess barriers to discharge/monitor patient progress toward functional and medical goals  Care Tool:  Bathing    Body parts bathed by patient: Right arm, Left arm, Abdomen, Chest, Right upper leg, Left upper leg, Face, Front perineal area   Body parts bathed by helper: Buttocks     Bathing assist Assist Level: Minimal Assistance - Patient > 75%     Upper Body Dressing/Undressing Upper body dressing   What  is the patient wearing?: Pull over shirt    Upper body assist Assist Level: Set up assist    Lower Body Dressing/Undressing Lower body dressing      What is the patient wearing?: Pants, Underwear/pull up     Lower body assist Assist for lower body dressing: Minimal Assistance - Patient > 75%     Toileting Toileting    Toileting assist Assist for toileting: Minimal Assistance - Patient > 75%     Transfers Chair/bed transfer  Transfers assist  Chair/bed transfer activity did not occur: Safety/medical concerns  Chair/bed transfer assist level: Minimal Assistance - Patient > 75%     Locomotion Ambulation   Ambulation assist   Ambulation activity did not occur: Safety/medical concerns  Assist level: 2 helpers(+2 for WC follow and O2 transfer only; Min A otherwise) Assistive device: Walker-rolling Max distance: 12800ft   Walk 10 feet activity   Assist  Walk 10 feet activity did not occur: Safety/medical concerns  Assist level: Minimal Assistance - Patient > 75% Assistive device: Walker-rolling   Walk 50 feet activity   Assist Walk 50 feet with 2 turns activity did not occur: Safety/medical concerns  Assist level: 2 helpers Assistive device: Walker-rolling    Walk 150 feet activity   Assist Walk 150 feet activity did not occur: Safety/medical concerns         Walk 10 feet on uneven surface  activity   Assist Walk 10 feet on uneven surfaces activity did not occur: Safety/medical concerns         Wheelchair     Assist Will patient use wheelchair at  discharge?: (TBD) Type of Wheelchair: Manual    Wheelchair assist level: Supervision/Verbal cueing Max wheelchair distance: 50    Wheelchair 50 feet with 2 turns activity    Assist    Wheelchair 50 feet with 2 turns activity did not occur: Safety/medical concerns   Assist Level: Supervision/Verbal cueing   Wheelchair 150 feet activity     Assist          Blood pressure  121/80, pulse 90, temperature 98.1 F (36.7 C), temperature source Oral, resp. rate 18, height 6\' 1"  (1.854 m), weight (!) 140.7 kg, SpO2 97 %.  Medical Problem List and Plan: 1. Reduced Mobility, swllow function and self care secondary to left lateral medullary infarct, Wallenberg type syndrome, course complicated by multiple medical issues and prolonged hospital stay  Continue CIR 2. Antithrombotics: -DVT/anticoagulation:Pharmaceutical:Lovenox -antiplatelet therapy: ASA.   Resumed Plavix 3. Pain Management:n/A 4. Mood/delirium:  -antipsychotic agents:       increased seroquel and adjusted timing to 2100     Improving  -celexa added for anxiety  5. Neuropsych: This patientis ?not fullycapable of making decisions onhisown behalf. 6. Skin/Wound Care:Routine pressure relief measures. 7. Fluids/Electrolytes/Nutrition:  BMP within acceptable range on 9/14, labs ordered for Monday 8. T2DM: Hgb A1c-10.6. Now on lantus with SSI for elevated BS. Continue to monitor BS every 4 hours.   continue  lantus  33u CBG (last 3)  Recent Labs    07/07/19 1646 07/07/19 2233 07/08/19 0613  GLUCAP 209* 121* 117*   Slightly labile 9/19, monitor for trend 9. Recurrent PNA--H flu on 9/7: Has been on prolonged antibiotics--narrowed to rocephin on 9/8-9/12  -afebrile currently  -9/10 CXR without acute disease. 10. Respiratory failure s/p trach/OSA:     - TID robinul to help manage secretions.  -downsized to #4 trach 9/10.     -has significant OSA. Will need CPAP when decannulated.   -continue day time of plugging of trach.  Plan to d/w CCM regarding decannulation and initiation of CPAP 11. Tachycardia:   Resolved  12. Severe dysphagia: severe pharyngeal dysphagia   -appreciate IR follow up, PEG placed on 9/15  -tolerating bolus feeds 13. Brief unresponsive episodes:?  Resolved  -likely vasovagal events related to trach suctioning  -EEG performed and did not  demonstrate seizure activity  -Continue to monitor  14.  Morbid obesity  Continue to encourage weight loss 15.  Acute blood loss anemia  Hemoglobin 11.2 on 9/14  Labs ordered for Monday  LOS: 11 days A FACE TO FACE EVALUATION WAS PERFORMED  Ankit Lorie Phenix 07/08/2019, 9:47 AM

## 2019-07-09 ENCOUNTER — Inpatient Hospital Stay (HOSPITAL_COMMUNITY): Payer: Medicare Other

## 2019-07-09 LAB — GLUCOSE, CAPILLARY
Glucose-Capillary: 78 mg/dL (ref 70–99)
Glucose-Capillary: 166 mg/dL — ABNORMAL HIGH (ref 70–99)
Glucose-Capillary: 203 mg/dL — ABNORMAL HIGH (ref 70–99)
Glucose-Capillary: 183 mg/dL — ABNORMAL HIGH (ref 70–99)

## 2019-07-09 NOTE — Progress Notes (Signed)
Physical Therapy Session Note  Patient Details  Name: Chon Buhl MRN: [id number] Date of Birth: 01/24/1956  Today's Date: 07/09/2019 PT Individual Time: 5003-7048 PT Individual Time Calculation (min): 58 min   Short Term Goals: Week 1:  PT Short Term Goal 1 (Week 1): Pt will ambulate 50 ft with LRAD & CGA. PT Short Term Goal 1 - Progress (Week 1): Progressing toward goal PT Short Term Goal 2 (Week 1): Pt will complete bed mobility without hospital bed features with CGA. PT Short Term Goal 2 - Progress (Week 1): Met PT Short Term Goal 3 (Week 1): Pt will complete all transfers with CGA & LRAD. PT Short Term Goal 3 - Progress (Week 1): Progressing toward goal PT Short Term Goal 4 (Week 1): Pt will negotiate 4 steps with B rails & min assist for strengthening. PT Short Term Goal 4 - Progress (Week 1): Progressing toward goal Week 2:  PT Short Term Goal 1 (Week 2): Pt will be able to perform functional transfers with CGA PT Short Term Goal 2 (Week 2): Pt will be able to gait x 100' with CGA PT Short Term Goal 3 (Week 2): Pt will be able to perform 4 stairs with rails with min assist for functional strengthening Week 3:     Skilled Therapeutic Interventions/Progress Updates:      Supine to sit on edge of bed w/supervision. Static sit w/supervision while tech donned shoes and aircast Sit to stand and transferred to wc w/min assist on 2L 02 via [place]. Gait 73fx 1, 642f1, 7566f 1  W/mild wobbles L knee, min assist for balance, 02 2L via [place], second person assist for wc follow and 02tank management.  HR 111-112, 02sats 95-96%. Last gait effort no 02, 02sats 95%, HR 110  02 removed.   Repeated sit to stand from hi/lo mat  1x5 HR 110, 02sats 95% 1x6 HR 112, 02 sats 91%  Pt able to tolerate therex and gait training today on room air w/trach cap in place.  Attempted first w/2L 02 via [place] then on RA and 02 sats remained in 90's , HR 110-112.  Minimal coughing during session.  Did c/o  tremor in l LE and L hand which he felt may be side effect on new meds.  Encouraged pt to discuss w/md.     Pt rested in sitting then transferred wc to recliner w/RW and min assist of 1.  Left oob in recliner w/seat alarm set, needs in reach.  Therapy Documentation Precautions:  Precautions Precautions: Fall, Other (comment) Precaution Comments: PEG, watch BP, HR & O2, PMV with therapies, trach collar Required Braces or Orthoses: Other Brace Other Brace: aircast on LLE during gait Restrictions Weight Bearing Restrictions: No    Therapy/Group: Individual Therapy  BarCallie FieldingT  07/09/2019, 4:34 PM

## 2019-07-09 NOTE — Progress Notes (Signed)
[place] PHYSICAL MEDICINE & REHABILITATION PROGRESS NOTE   Subjective/Complaints: Patient seen laying in bed this morning.  He states he slept well overnight.  He notes improvement in secretions.  He denies complaints.  ROS: Denies CP, SOB, N/V/D  Objective:   No results found. No results for input(s): WBC, HGB, HCT, PLT in the last 72 hours. No results for input(s): NA, K, CL, CO2, GLUCOSE, BUN, CREATININE, CALCIUM in the last 72 hours.  Intake/Output Summary (Last 24 hours) at 07/09/2019 0948 Last data filed at 07/09/2019 0645 Gross per 24 hour  Intake 674 ml  Output 1550 ml  Net -876 ml     Physical Exam: Vital Signs Blood pressure 121/72, pulse 85, temperature 98 F (36.7 C), temperature source Oral, resp. rate 18, height 6\' 1"  (1.854 m), weight (!) 139.3 kg, SpO2 98 %. Constitutional: No distress . Vital signs reviewed. HENT: Normocephalic.  Atraumatic. Neck: + Trach Eyes: EOMI. No discharge. Cardiovascular: No JVD. Respiratory: Normal effort.  No stridor. GI: Non-distended.  + PEG. Skin: Warm and dry.  Intact. Psych: Normal mood.  Normal behavior. Musc: No edema in extremities.  No tenderness in extremities. Neurological: Alert Motor: 4+/5 throughout, unchanged  Assessment/Plan: 1. 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  Physiatrist is providing close team supervision and 24 hour management of active medical problems listed below.  Physiatrist and rehab team continue to assess barriers to discharge/monitor patient progress toward functional and medical goals  Care Tool:  Bathing    Body parts bathed by patient: Right arm, Left arm, Abdomen, Chest, Right upper leg, Left upper leg, Face, Front perineal area   Body parts bathed by helper: Buttocks     Bathing assist Assist Level: Minimal Assistance - Patient > 75%     Upper Body  Dressing/Undressing Upper body dressing   What is the patient wearing?: Pull over shirt    Upper body assist Assist Level: Set up assist    Lower Body Dressing/Undressing Lower body dressing      What is the patient wearing?: Pants, Underwear/pull up     Lower body assist Assist for lower body dressing: Minimal Assistance - Patient > 75%     Toileting Toileting    Toileting assist Assist for toileting: Minimal Assistance - Patient > 75%     Transfers Chair/bed transfer  Transfers assist  Chair/bed transfer activity did not occur: Safety/medical concerns  Chair/bed transfer assist level: Minimal Assistance - Patient > 75%     Locomotion Ambulation   Ambulation assist   Ambulation activity did not occur: Safety/medical concerns  Assist level: 2 helpers(+2 for WC follow and O2 transfer only; Min A otherwise) Assistive device: Walker-rolling Max distance: 16900ft   Walk 10 feet activity   Assist  Walk 10 feet activity did not occur: Safety/medical concerns  Assist level: Minimal Assistance - Patient > 75% Assistive device: Walker-rolling   Walk 50 feet activity   Assist Walk 50 feet with 2 turns activity did not occur: Safety/medical concerns  Assist level: 2 helpers Assistive device: Walker-rolling    Walk 150 feet activity   Assist Walk 150 feet activity did not occur: Safety/medical concerns         Walk 10 feet on uneven surface  activity   Assist Walk 10 feet on uneven surfaces activity did not occur: Safety/medical concerns         Wheelchair  Assist Will patient use wheelchair at discharge?: (TBD) Type of Wheelchair: Manual    Wheelchair assist level: Supervision/Verbal cueing Max wheelchair distance: 50    Wheelchair 50 feet with 2 turns activity    Assist    Wheelchair 50 feet with 2 turns activity did not occur: Safety/medical concerns   Assist Level: Supervision/Verbal cueing   Wheelchair 150 feet  activity     Assist          Blood pressure 121/72, pulse 85, temperature 98 F (36.7 C), temperature source Oral, resp. rate 18, height 6\' 1"  (1.854 m), weight (!) 139.3 kg, SpO2 98 %.  Medical Problem List and Plan: 1. Reduced Mobility, swllow function and self care secondary to left lateral medullary infarct, Wallenberg type syndrome, course complicated by multiple medical issues and prolonged hospital stay  Continue CIR 2. Antithrombotics: -DVT/anticoagulation:Pharmaceutical:Lovenox -antiplatelet therapy: ASA.   Resumed Plavix 3. Pain Management:n/A 4. Mood/delirium:  -antipsychotic agents:       increased seroquel and adjusted timing to 2100     Improving, will consider weaning  -celexa added for anxiety  5. Neuropsych: This patientis ?not fullycapable of making decisions onhisown behalf. 6. Skin/Wound Care:Routine pressure relief measures. 7. Fluids/Electrolytes/Nutrition:  BMP within acceptable range on 9/14, labs ordered for tomorrow 8. T2DM: Hgb A1c-10.6. Now on lantus with SSI for elevated BS. Continue to monitor BS every 4 hours.   continue  lantus  33u CBG (last 3)  Recent Labs    07/08/19 1658 07/08/19 2107 07/09/19 0626  GLUCAP 92 183* 78   Labile on 9/20, monitor for trend 9. Recurrent PNA--H flu on 9/7: Has been on prolonged antibiotics--narrowed to rocephin on 9/8-9/12  -afebrile currently  -9/10 CXR without acute disease. 10. Respiratory failure s/p trach/OSA:     - TID robinul to help manage secretions.  -downsized to #4 trach 9/10.     -has significant OSA. Will need CPAP when decannulated.   -continue day time of plugging of trach.  Plan to d/w CCM regarding decannulation and initiation of CPAP 11. Tachycardia:   Resolved  12. Severe dysphagia: severe pharyngeal dysphagia  -appreciate IR follow up, PEG placed on 9/15  -tolerating bolus feeds 13. Brief unresponsive episodes:?  Resolved  -likely vasovagal  events related to trach suctioning  -EEG performed and did not demonstrate seizure activity  -Continue to monitor  14.  Morbid obesity  Continue to encourage weight loss 15.  Acute blood loss anemia  Hemoglobin 11.2 on 9/14  Labs ordered for tomorrow  LOS: 12 days A FACE TO FACE EVALUATION WAS PERFORMED  Ankit Lorie Phenix 07/09/2019, 9:48 AM

## 2019-07-09 NOTE — Progress Notes (Signed)
Pt's trach capped on arrival to room. Pt tolerating well, no distress noted. RT will continue to monitor

## 2019-07-10 ENCOUNTER — Inpatient Hospital Stay (HOSPITAL_COMMUNITY): Payer: Medicare Other | Admitting: Physical Therapy

## 2019-07-10 ENCOUNTER — Inpatient Hospital Stay (HOSPITAL_COMMUNITY): Payer: Medicare Other

## 2019-07-10 ENCOUNTER — Inpatient Hospital Stay (HOSPITAL_COMMUNITY): Payer: Medicare Other | Admitting: Speech Pathology

## 2019-07-10 DIAGNOSIS — G4733 Obstructive sleep apnea (adult) (pediatric): Secondary | ICD-10-CM

## 2019-07-10 DIAGNOSIS — R0902 Hypoxemia: Secondary | ICD-10-CM

## 2019-07-10 LAB — BASIC METABOLIC PANEL
Anion gap: 7 (ref 5–15)
BUN: 11 mg/dL (ref 8–23)
CO2: 28 mmol/L (ref 22–32)
Calcium: 8.7 mg/dL — ABNORMAL LOW (ref 8.9–10.3)
Chloride: 105 mmol/L (ref 98–111)
Creatinine, Ser: 0.69 mg/dL (ref 0.61–1.24)
GFR calc Af Amer: >60 mL/min (ref >60)
GFR calc non Af Amer: >60 mL/min (ref >60)
Glucose, Bld: 88 mg/dL (ref 70–99)
Potassium: 4 mmol/L (ref 3.5–5.1)
Sodium: 140 mmol/L (ref 135–145)

## 2019-07-10 LAB — CBC
HCT: 35.2 % — ABNORMAL LOW (ref 39.0–52.0)
Hemoglobin: 11.7 g/dL — ABNORMAL LOW (ref 13.0–17.0)
MCH: 30.6 pg (ref 26.0–34.0)
MCHC: 33.2 g/dL (ref 30.0–36.0)
MCV: 92.1 fL (ref 80.0–100.0)
Platelets: 211 10*3/uL (ref 150–400)
RBC: 3.82 MIL/uL — ABNORMAL LOW (ref 4.22–5.81)
RDW: 12.4 % (ref 11.5–15.5)
WBC: 5.6 10*3/uL (ref 4.0–10.5)
nRBC: 0 % (ref 0.0–0.2)

## 2019-07-10 LAB — GLUCOSE, CAPILLARY
Glucose-Capillary: 81 mg/dL (ref 70–99)
Glucose-Capillary: 158 mg/dL — ABNORMAL HIGH (ref 70–99)
Glucose-Capillary: 112 mg/dL — ABNORMAL HIGH (ref 70–99)
Glucose-Capillary: 208 mg/dL — ABNORMAL HIGH (ref 70–99)

## 2019-07-10 NOTE — Progress Notes (Signed)
Occupational Therapy Session Note  Patient Details  Name: Michael Bright MRN: [id number] Date of Birth: 03/03/1956  Today's Date: 07/10/2019 OT Individual Time: 1445-1545 OT Individual Time Calculation (min): 60 min    Short Term Goals: Week 2:  OT Short Term Goal 1 (Week 2): Pt will don pants with CGA OT Short Term Goal 2 (Week 2): Pt will require no more than 2 rest breaks in 30 min to manage secretions without suction OT Short Term Goal 3 (Week 2): Pt will complete standing level grooming task with all VSS for > 3 minutes  Skilled Therapeutic Interventions/Progress Updates:    Pt received sitting EOB with no c/o pain. Pt c/o fatigue and stating his L leg was "making him nervous" today. Pt's trach was capped throughout session with no desaturations. Pt was transported to the therapy gym. Pt used RW to transfer to therapy mat with CGA. Pt completed activity with sit <> stand component and functional reaching, requiring min A to CGA to stand. As pt progressed and became more fatigued, he eventually was unable to stand with mod A. Rest of session focused on BUE strengthening/functioanl reaching. Pt completed closed chain BUE forward reaching with AAROM, requiring min facilitation to reach ~80 degrees of shoulder flex. Pt then completed scapular stabilization exercises, focused on retraction/protraction and upward/downward rotation. Pt returned to his w/c and to his room. He was left sitting up with chair pad alarm on, trach capped.   Therapy Documentation Precautions:  Precautions Precautions: Fall, Other (comment) Precaution Comments: PEG, watch BP, HR & O2, PMV with therapies, trach collar Required Braces or Orthoses: Other Brace Other Brace: aircast on LLE during gait Restrictions Weight Bearing Restrictions: No   Therapy/Group: Individual Therapy  Curtis Sites 07/10/2019, 7:24 AM

## 2019-07-10 NOTE — Progress Notes (Signed)
Speech Language Pathology Daily Session Note  Patient Details  Name: Michael Bright MRN: [id number] Date of Birth: [birth year]  Today's Date: 07/10/2019 SLP Individual Time: 0805-0900 SLP Individual Time Calculation (min): 55 min  Short Term Goals: Week 2: SLP Short Term Goal 1 (Week 2): Pt will demonstrate tolerance of PMSV in 60 minute intervals with vitals signs remaining WFL. SLP Short Term Goal 2 (Week 2): Pt will preform throat clear and swallow to reduce vocal qaulity wetness with min A verbal cues. SLP Short Term Goal 3 (Week 2): Pt will consume ice chip trials to preserve swallow function with moderate s/s aspiration with overall supervision level verbal cues. SLP Short Term Goal 4 (Week 2): Pt will complete semi-complex problem solving tasks with min A verbal cues. SLP Short Term Goal 5 (Week 2): Pt will utilizie memory compensatory strategies to recall new, daily information with min A verbal cues.  Skilled Therapeutic Interventions:  Pt was seen for skilled ST targeting dysphagia goals.  Pt was in bed upon arrival but was agreeable to getting out of bed for treatment.  Pt was transferred to wheelchair to maximize safety with PO trials.  Pt consumed ~20 ice chips after oral care with delayed coughing noted in ~25% of trials.   Pt reports feeling very encouraged by his progress from initial trials earlier in his admission.  Pt was also able to complete his prescribed swallowing exercise regimen (masako, effortful swallow, tongue protrusion against resistance/tongue depressor, and tongue elevation with resistance against roof of mouth) with mod I.  Pt was left in wheelchair with all needs within reach.  Continue per current plan of care.    Pain Pain Assessment Pain Scale: 0-10 Pain Score: 0-No pain  Therapy/Group: Individual Therapy  Parthiv Mucci, Selinda Orion 07/10/2019, 8:59 AM

## 2019-07-10 NOTE — Consult Note (Addendum)
NAME:  Michael Bright, MRN:  [id number], DOB:  [birth year], LOS: 13 ADMISSION DATE:  06/27/2019, CONSULTATION DATE:  07/10/2019 REFERRING MD: Riley KillSwartz  CHIEF COMPLAINT:  Tracheostomy decannulation   HPI  Michael Bright is a 63 y.o. 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  PCCM evaluated 8/12 for poor secretion management. His course has been complicated by ongoing dysphagia resulting in presumed aspiration requiring intubation. Subsequently trached on 8/21. D/C to in patient rehab. Trach down sized to #4 Shiley cuffless on 9/10. Tolerating cap trials during the dae. PCCM consulted for possible decannulation.   Past Medical History  DM, HTN.  Past Medical History:  Diagnosis Date  . Diabetes [facility](HCC)          Past Surgical History:  Procedure Laterality Date  . CHOLECYSTECTOMY      No family history on file.  Social History:Married. Wife works for [employer]. He retired from [employer]. He reports that he has never smoked. He has never used smokeless tobacco. He reports current alcohol use. He reports that he does not use drugs.  Significant Hospital Events including prior to d/c to IR  8/11 > admit. 8/12 > PCCM consult. 8/15 PCCM re consulted  8/15 ETT>>> 8/21 8/21 trach.  9/10 Downsized to # 4 Shiley cuffless  9/20 tolerating cap trials  Consults:   PCCM  Procedures:  NA  Significant Diagnostic Tests:  NA  Micro Data:  NA  Antimicrobials:  NA  Interim history/subjective:  Pt states he was able to swallow some ice chips today, some coughing. Strong voice quality and phonation.   Objective:  Blood pressure 114/75, pulse 88, temperature 98.5 F (36.9 C), temperature source Oral, resp. rate 20, height 6\' 1"  (1.854 m), weight (!) 139.8 kg, SpO2 93 %.    FiO2 (%):  [28 %] 28 %   Intake/Output Summary (Last 24 hours) at 07/10/2019 1115 Last data filed at 07/10/2019 0350 Gross per 24 hour  Intake 200 ml  Output 965 ml  Net -765 ml   Filed Weights   07/08/19 0615 07/09/19 0516 07/10/19 0354  Weight: (!) 140.7 kg (!) 139.3 kg (!) 139.8 kg    Examination: General: Obese, well-developed. NAD HENT: Normocephalic, PERRL. Moist mucus membranes Neck: Trach site CDI, no drainage. Capped. Phonating well.  CV: RRR. S1S2. No MRG. +2 distal pulses Lungs: BBS present, clear, FNL, symmetrical ABD: +BS x4. SNT/ND. No masses, guarding or rigidity GU: No Foley EXT: Generalized edema Skin: PWD. In tact. No rashes or lesions Neuro: A&Ox3. CN II-XII in tact. No focal deficits     Assessment & Plan:  S/P Respiratory Failure suspected 2/2 aspiration with resultant tracheostomy placement. Now with good voice quality.  Tolerating capped trach during the day.  Patient does have a history of OSA.  Will need to tolerate capped trach overnight. -Order placed to leave trach capped overnight. -Will reevaluate on 9/22 for possible decannulation.  Karin Lieuhonda Wright, MSN, AGACNP  Pager [phone] or if no answer [phone] [facility]  Attending Note:  63 year old male with respiratory failure due to CVA who was trached due to dysphagia.  Janina Mayorach was maintain for secretions concerns as well as OSA.  Patient has been capped during the day and uncapped at night for 4 days.  On exam, he is clear to auscultation and alert and following commands.  I reviewed CXR myself, trach is in a good position.  Discussed with PCCM-NP and rehab NP.  Trach status:  - Cap  trach overnight  - Reeval sat in AM, if stable will consider decannulation  OSA:  - Cap trach tonight  - CPAP while asleep if will wear  Hypoxemia:  - Titrate O2 for sat of 88-92%  - May need an ambulatory desat study prior to discharge for ?home O2 once trach is out  Patient seen and examined, agree with above note.  I dictated the care and orders written for this patient under my direction.  Rush Farmer, [place]

## 2019-07-10 NOTE — Progress Notes (Signed)
Physical Therapy Session Note  Patient Details  Name: Michael Bright MRN: [id number] Date of Birth: 1956-06-25  Today's Date: 07/10/2019 PT Individual Time: 1000-1045 PT Individual Time Calculation (min): 45 min   Short Term Goals: Week 2:  PT Short Term Goal 1 (Week 2): Pt will be able to perform functional transfers with CGA PT Short Term Goal 2 (Week 2): Pt will be able to gait x 100' with CGA PT Short Term Goal 3 (Week 2): Pt will be able to perform 4 stairs with rails with min assist for functional strengthening  Skilled Therapeutic Interventions/Progress Updates:  Received pt sitting upright in wheelchair, pt agreeable to therapy. Trach cap loose - RN made aware and reapplied. Pt on room air during session. Today's session focused on cardiovascular endurance, improved activity tolerance, and dynamic balance with ambulation and ADL's. Pt ambulated 161ft x1 trial with RW and aircast donned on L ankle min A with verbal cueing to increase step width LLE and to remain inside the walker when ambulating, after O2 sat was 93% and HR 114bpm. While sitting, therapist performed LLE hamstring stretch 2/2 pt's c/o pain & fatigue in LLE with gait. After 2 minute seated rest break, pt ambulated 7ft x1 with RW min A to bathroom with verbal cues to slow pace and focus on L foot placement with improvement in step width with verbal cuing. Pt had continent BM on toilet. Pt performed sit<>stand at toilet and stood at sink min A with RW with increased loss of balance due to fatigue from activity. While standing patient performed 10x of standing wall slides with L UE to increase ROM and alleviate L shoulder discomfort. O2 sat was 96% at end of session on room air. Concluded session with pt sitting in WC, chair pad alarmed, and all needs within reach.   Therapy Documentation Precautions:  Precautions Precautions: Fall, Other (comment) Precaution Comments: PEG, watch BP, HR & O2, trach now capped & on room air  (9/21) Required Braces or Orthoses: Other Brace Other Brace: aircast on LLE during gait Restrictions Weight Bearing Restrictions: No  Pain: Pt reported L shoulder pain but did not state number. ROM exercises and repositioning was done to alleviate pain.    Therapy/Group: Spencer PT, DPT    Lavone Nian, PT, DPT  07/10/2019, 11:20 AM

## 2019-07-10 NOTE — Progress Notes (Signed)
[place] PHYSICAL MEDICINE & REHABILITATION PROGRESS NOTE   Subjective/Complaints: Pt in bed. Says he had a good night.  Tolerating trach plugging during the day. HS confusion essentially resolved.   ROS: Patient denies fever, rash, sore throat, blurred vision, nausea, vomiting, diarrhea, cough, shortness of breath or chest pain, joint or back pain, headache, or mood change.    Objective:   No results found. Recent Labs    07/10/19 0655  WBC 5.6  HGB 11.7*  HCT 35.2*  PLT 211   Recent Labs    07/10/19 0655  NA 140  K 4.0  CL 105  CO2 28  GLUCOSE 88  BUN 11  CREATININE 0.69  CALCIUM 8.7*    Intake/Output Summary (Last 24 hours) at 07/10/2019 1045 Last data filed at 07/10/2019 0350 Gross per 24 hour  Intake 200 ml  Output 965 ml  Net -765 ml     Physical Exam: Vital Signs Blood pressure 114/75, pulse 88, temperature 98.5 F (36.9 C), temperature source Oral, resp. rate 20, height 6\' 1"  (1.854 m), weight (!) 139.8 kg, SpO2 93 %. Constitutional: No distress . Vital signs reviewed. HEENT: EOMI, oral membranes moist Neck: supple Cardiovascular: RRR without murmur. No JVD    Respiratory: CTA Bilaterally without wheezes or rales. Normal effort    GI: BS +, non-tender, non-distended. PEG site CDI Skin: Warm and dry.  Intact. Psych: Normal mood.  Normal behavior. Musc: No edema in extremities.  No tenderness in extremities. Neurological: Alert Motor: 4+/5 throughout, unchanged  Assessment/Plan: 1. 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  Physiatrist is providing close team supervision and 24 hour management of active medical problems listed below.  Physiatrist and rehab team continue to assess barriers to discharge/monitor patient progress toward functional and medical goals  Care Tool:  Bathing    Body parts bathed by patient: Right arm, Left  arm, Abdomen, Chest, Right upper leg, Left upper leg, Face, Front perineal area   Body parts bathed by helper: Buttocks     Bathing assist Assist Level: Minimal Assistance - Patient > 75%     Upper Body Dressing/Undressing Upper body dressing   What is the patient wearing?: Pull over shirt    Upper body assist Assist Level: Set up assist    Lower Body Dressing/Undressing Lower body dressing      What is the patient wearing?: Pants, Underwear/pull up     Lower body assist Assist for lower body dressing: Minimal Assistance - Patient > 75%     Toileting Toileting    Toileting assist Assist for toileting: Minimal Assistance - Patient > 75%     Transfers Chair/bed transfer  Transfers assist  Chair/bed transfer activity did not occur: Safety/medical concerns  Chair/bed transfer assist level: Minimal Assistance - Patient > 75%     Locomotion Ambulation   Ambulation assist   Ambulation activity did not occur: Safety/medical concerns  Assist level: 2 helpers(to manage 02tank/wc) Assistive device: Walker-rolling Max distance: 88   Walk 10 feet activity   Assist  Walk 10 feet activity did not occur: Safety/medical concerns  Assist level: Minimal Assistance - Patient > 75% Assistive device: Walker-rolling   Walk 50 feet activity   Assist Walk 50 feet with 2 turns activity did not occur: Safety/medical concerns  Assist level: 2 helpers Assistive device: Walker-rolling    Walk 150 feet activity   Assist Walk 150 feet activity did  not occur: Safety/medical concerns         Walk 10 feet on uneven surface  activity   Assist Walk 10 feet on uneven surfaces activity did not occur: Safety/medical concerns         Wheelchair     Assist Will patient use wheelchair at discharge?: (TBD) Type of Wheelchair: Manual    Wheelchair assist level: Supervision/Verbal cueing Max wheelchair distance: 50    Wheelchair 50 feet with 2 turns  activity    Assist    Wheelchair 50 feet with 2 turns activity did not occur: Safety/medical concerns   Assist Level: Supervision/Verbal cueing   Wheelchair 150 feet activity     Assist          Blood pressure 114/75, pulse 88, temperature 98.5 F (36.9 C), temperature source Oral, resp. rate 20, height 6\' 1"  (1.854 m), weight (!) 139.8 kg, SpO2 93 %.  Medical Problem List and Plan: 1. Reduced Mobility, swllow function and self care secondary to left lateral medullary infarct, Wallenberg type syndrome, course complicated by multiple medical issues and prolonged hospital stay  Continue CIR 2. Antithrombotics: -DVT/anticoagulation:Pharmaceutical:Lovenox -antiplatelet therapy: ASA.   Resumed Plavix 3. Pain Management:n/A 4. Mood/delirium:  -antipsychotic agents:       increased seroquel and adjusted timing to 2100     Continue current dose  -celexa added for anxiety  5. Neuropsych: This patientis ?not fullycapable of making decisions onhisown behalf. 6. Skin/Wound Care:Routine pressure relief measures. 7. Fluids/Electrolytes/Nutrition:  BMP within acceptable range on 9/21 8. T2DM: Hgb A1c-10.6. Now on lantus with SSI for elevated BS. Continue to monitor BS every 4 hours.   continue  lantus  33u CBG (last 3)  Recent Labs    07/09/19 1642 07/09/19 2122 07/10/19 0620  GLUCAP 203* 183* 81   Inconsistent control but improving. Higher in later PM hours  -titrate lantus to 36u qam 9. Recurrent PNA--H flu on 9/7: Has been on prolonged antibiotics--narrowed to rocephin on 9/8-9/12  -afebrile currently  -9/10 CXR without acute disease. 10. Respiratory failure s/p trach/OSA:     - TID robinul to help manage secretions.  -downsized to #4 trach 9/10.     -has significant OSA. Will need CPAP when decannulated.   -tolerating daytime trach plugging.  d/w CCM regarding potential decannulation and initiation of CPAP 11. Tachycardia:    Resolved  12. Severe dysphagia: severe pharyngeal dysphagia  -appreciate IR follow up, PEG placed on 9/15  -tolerating bolus feeds 13. Brief unresponsive episodes:?  Resolved  -likely vasovagal events related to trach suctioning  -EEG performed and did not demonstrate seizure activity  -Continue to monitor  14.  Morbid obesity  Continue to encourage weight loss 15.  Acute blood loss anemia  -I personally reviewed the patient's labs today.    Hemoglobin 11.7 9/21  LOS: 13 days A FACE TO FACE EVALUATION WAS PERFORMED  10/21 07/10/2019, 10:45 AM

## 2019-07-10 NOTE — Progress Notes (Signed)
Physical Therapy Session Note  Patient Details  Name: Michael Bright MRN: [id number] Date of Birth: 1956/09/14  Today's Date: 07/10/2019 PT Individual Time: 1300-1345 PT Individual Time Calculation (min): 45 min   Short Term Goals: Week 1:  PT Short Term Goal 1 (Week 1): Pt will ambulate 50 ft with LRAD & CGA. PT Short Term Goal 1 - Progress (Week 1): Progressing toward goal PT Short Term Goal 2 (Week 1): Pt will complete bed mobility without hospital bed features with CGA. PT Short Term Goal 2 - Progress (Week 1): Met PT Short Term Goal 3 (Week 1): Pt will complete all transfers with CGA & LRAD. PT Short Term Goal 3 - Progress (Week 1): Progressing toward goal PT Short Term Goal 4 (Week 1): Pt will negotiate 4 steps with B rails & min assist for strengthening. PT Short Term Goal 4 - Progress (Week 1): Progressing toward goal Week 2:  PT Short Term Goal 1 (Week 2): Pt will be able to perform functional transfers with CGA PT Short Term Goal 2 (Week 2): Pt will be able to gait x 100' with CGA PT Short Term Goal 3 (Week 2): Pt will be able to perform 4 stairs with rails with min assist for functional strengthening  Skilled Therapeutic Interventions/Progress Updates:    Received pt supine in bed. Pt agreeable to PT this afternoon. Pt stated that he had no pain but his L LE continues to feel weak. Today's session focused on LE strength and endurance with ADL's, stairs, transfers, and improved tolerance to activity. Pt navigated 4 steps x 2 trials mod assist with constant verbal cues for step sequence and foot placement. Pt demonstrated no loss of balance during trial 1 but during trial 2, pt had 2 major loss of balance episodes requiring max assist to recover due to incorrect step sequence and attempting to complete the task too quickly. Pt was fatigued and frightened after loss of balance and required 3 minute seated rest break in between trials. O2 sat was 93% on room air and HR 114bpm after stair  navigation. Pt performed car transfer for SUV min assist with RW and verbal cues to turn completely prior to sitting in car. Pt performed sit<>stands x3 with RW min assist and verbal cues to push up from the [facility]. Pt was fatigued at the end of today's session but tolerated the session well. Concluded session with pt sitting in chair, chair pad alarmed, and all needs within reach.   Therapy Documentation Precautions:  Precautions Precautions: Fall, Other (comment) Precaution Comments: PEG, watch BP, HR & O2, trach now capped & on room air (9/21) Required Braces or Orthoses: Other Brace Other Brace: aircast on LLE during gait Restrictions Weight Bearing Restrictions: No   Pain:  Pt denied any pain during today's session.     Therapy/Group: Individual Therapy  Alfonse Alpers PT, DPT    Lars Masson, PT, DPT, CBIS 3:49 PM   07/10/2019, 3:30 PM

## 2019-07-10 NOTE — Progress Notes (Signed)
RT placed pt on CPAP dream station w/no O2 bled into the system. Pt is a capped trach who has orders for CPAP while capped at night. Pt tolerating well. Pt respiratory status stable at this time on CPAP. RT will continue to monitor.

## 2019-07-11 ENCOUNTER — Inpatient Hospital Stay (HOSPITAL_COMMUNITY): Payer: Medicare Other

## 2019-07-11 ENCOUNTER — Inpatient Hospital Stay (HOSPITAL_COMMUNITY): Payer: Medicare Other | Admitting: Physical Therapy

## 2019-07-11 ENCOUNTER — Inpatient Hospital Stay (HOSPITAL_COMMUNITY): Payer: Medicare Other | Admitting: Speech Pathology

## 2019-07-11 LAB — GLUCOSE, CAPILLARY
Glucose-Capillary: 132 mg/dL — ABNORMAL HIGH (ref 70–99)
Glucose-Capillary: 202 mg/dL — ABNORMAL HIGH (ref 70–99)
Glucose-Capillary: 85 mg/dL (ref 70–99)
Glucose-Capillary: 141 mg/dL — ABNORMAL HIGH (ref 70–99)

## 2019-07-11 NOTE — Procedures (Signed)
Tracheostomy Change Note  Patient Details:   Name: Michael Bright DOB: 1956/03/21 MRN: [id number]    Airway Documentation:     Evaluation  O2 sats: stable throughout Complications: No apparent complications Patient did tolerate procedure well. Bilateral Breath Sounds: Clear, Diminished   Pt was decannulated with two RT's at bedside per MD order. Pt able to speak after trach was removed and the stoma was dressed with gauze. RT will continue to monitor pt stoma.   Laquandra Carrillo A Siboney Requejo 07/11/2019, 11:50 AM

## 2019-07-11 NOTE — Progress Notes (Signed)
Physical Therapy Session Note  Patient Details  Name: Michael Bright MRN: [id number] Date of Birth: 1956-08-21  Today's Date: 07/11/2019 PT Individual Time: 0930-1000 PT Individual Time Calculation (min): 30 min   Short Term Goals: Week 2:  PT Short Term Goal 1 (Week 2): Pt will be able to perform functional transfers with CGA PT Short Term Goal 2 (Week 2): Pt will be able to gait x 100' with CGA PT Short Term Goal 3 (Week 2): Pt will be able to perform 4 stairs with rails with min assist for functional strengthening  Skilled Therapeutic Interventions/Progress Updates:    Pt presents in bed with trach capped and performing oral suction. Pt reporting increased secretions this morning. RN aware and present in room. Pt reporting more general fatigue this morning and pt wondering if medication related. Requires more min assist today for sit <> stands than CGA as he has before with facilitation needed for anterior weightshift and min assist for power up. Pt requiring several attempts for initial sit <> stands. Min assist to transfer to w/c with RW with cues for eccentric control and to reach back for seat before sitting. Blocked practice sit <> stands x 4 reps with decreased anterior weightshift, increased L lateral bias, and more assist needed for power up. Pt able to gait  X 50' x 2 with seated rest break between trials with overall min assist - increased instability in LLE noted with gait. Pt able to perform last transfer with CGA with verbal cues for hand placement and technique.   Therapy Documentation Precautions:  Precautions Precautions: Fall, Other (comment) Precaution Comments: PEG, watch BP, HR & O2, trach now capped & on room air (9/21) Required Braces or Orthoses: Other Brace Other Brace: aircast on LLE during gait Restrictions Weight Bearing Restrictions: No Vital Signs: Oxygen Therapy SpO2: 93-96% (one episode of dropping to 85% after gait but resumed to 92% in 30 sec) O2  Device: Room Air Pain:  Reports L shoulder still bothersome. Premedicated.    Therapy/Group: Individual Therapy  Canary Brim Ivory Broad, PT, DPT, CBIS  07/11/2019, 10:14 AM

## 2019-07-11 NOTE — Progress Notes (Signed)
   NAME:  Michael Bright, MRN:  [id number], DOB:  [birth year], LOS: 14 ADMISSION DATE:  06/27/2019, CONSULTATION DATE:  07/10/2019 REFERRING MD: Naaman Plummer  CHIEF COMPLAINT:  Tracheostomy decannulation   HPI  Michael Bright is a 63 y.o. 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  PCCM evaluated 8/12 for poor secretion management. His course has been complicated by ongoing dysphagia resulting in presumed aspiration requiring intubation. Subsequently trached on 8/21. D/C to in patient rehab. Trach down sized to #4 Shiley cuffless on 9/10. Tolerating cap trials during the dae. PCCM consulted for possible decannulation.   Past Medical History  DM, HTN.  Past Medical History:  Diagnosis Date  . Diabetes [facility])          Past Surgical History:  Procedure Laterality Date  . CHOLECYSTECTOMY      No family history on file.  Social History:Married. Wife works for [employer]. He retired from [employer]. He reports that he has never smoked. He has never used smokeless tobacco. He reports current alcohol use. He reports that he does not use drugs.  Significant Hospital Events including prior to d/c to IR  8/11 > admit. 8/12 > PCCM consult. 8/15 PCCM re consulted  8/15 ETT>>> 8/21 8/21 trach.  9/10 Downsized to # 4 Shiley cuffless  9/20 tolerating cap trials  Consults:  PCCM  Procedures:  NA  Significant Diagnostic Tests:  NA  Micro Data:  NA  Antimicrobials:  NA  Interim history/subjective:  Trach capped last night. Pt states he wore CPAP (no O2 bled in). Today he is sitting up in wheel chair, working with SLP, taking ice chips. Good strong vocalization and phonation. SpO2 98-99% on RA.   Objective:  Blood pressure 127/76, pulse (!) 102, temperature 98 F (36.7 C), resp. rate 20, height 6\' 1"  (1.854 m), weight (!) 146.2 kg, SpO2 98 %.    FiO2 (%):  [21 %-28 %] 21 %   Intake/Output Summary (Last 24 hours) at 07/11/2019 1111 Last data filed at 07/11/2019 0017 Gross per 24  hour  Intake -  Output 425 ml  Net -425 ml   Filed Weights   07/09/19 0516 07/10/19 0354 07/11/19 0500  Weight: (!) 139.3 kg (!) 139.8 kg (!) 146.2 kg    Examination: General: Obese, well-developed. NAD HENT: Normocephalic, PERRL. Moist mucus membranes Neck: Trach site CDI, no drainage. Capped. Phonating well.  Lungs: BBS present, clear, FNL, symmetrical EXT: Generalized edema Skin: PWD. In tact. No rashes or lesions Neuro: A&Ox3.      Assessment & Plan:  S/P Respiratory Failure suspected 2/2 aspiration with resultant tracheostomy placement.   Trach status: -did well overnight with trach capped, on CPAP.  -D/c trach  OSA: -continue HS CPAP-does not have same at home. Case mgt assisting.   Hypoxemia: -will need ambulating SpO2 /p trach removed prior to d/c to eval for home [place], MSN, AGACNP  Pager [phone] or if no answer [phone] [facility]

## 2019-07-11 NOTE — Progress Notes (Signed)
Speech Language Pathology Weekly Progress and Session Note  Patient Details  Name: Armando Bukhari MRN: [id number] Date of Birth: [birth year]  Beginning of progress report period: July 04, 2019 End of progress report period: July 11, 2019  Today's Date: 07/11/2019 SLP Individual Time: 1030-1110 SLP Individual Time Calculation (min): 40 min  Short Term Goals: Week 2: SLP Short Term Goal 1 (Week 2): Pt will demonstrate tolerance of PMSV in 60 minute intervals with vitals signs remaining WFL. SLP Short Term Goal 1 - Progress (Week 2): Met SLP Short Term Goal 2 (Week 2): Pt will preform throat clear and swallow to reduce vocal qaulity wetness with min A verbal cues. SLP Short Term Goal 2 - Progress (Week 2): Met SLP Short Term Goal 3 (Week 2): Pt will consume ice chip trials to preserve swallow function with moderate s/s aspiration with overall supervision level verbal cues. SLP Short Term Goal 3 - Progress (Week 2): Met SLP Short Term Goal 4 (Week 2): Pt will complete semi-complex problem solving tasks with min A verbal cues. SLP Short Term Goal 4 - Progress (Week 2): Met SLP Short Term Goal 5 (Week 2): Pt will utilizie memory compensatory strategies to recall new, daily information with min A verbal cues. SLP Short Term Goal 5 - Progress (Week 2): Met    New Short Term Goals: Week 3: SLP Short Term Goal 1 (Week 3): STGs=LTGs due to ELOS  Weekly Progress Updates: Patient continues to demonstrate excellent gains and has met 5 of 5 STGs this reporting period. Currently, patient remains NPO with a PEG tube but demonstrates improved ability to perform pharyngeal strengthening exercises and increased tolerance with trials of ice chips indicated by reduced overt s/s of aspiration and decreased explosiveness of coughing. Patient may be ready for repeat MBS by end of this week to assess swallow function. Patient is currently tolerating capping trials with all vitals remaining WFL and adequate  vocal intensity. Suspect patient will be decannulated by end of this week. Patient also demonstrates improved cognitive functioning and demonstrates improved recall of functional information as well as improved problem solving with basic and familiar tasks. Patient and family education ongoing. Patient would benefit from continued skillled SLP intervention to maximize his swallowing, cognitive and speech function prior to discharge.      Intensity: Minumum of 1-2 x/day, 30 to 90 minutes Frequency: 3 to 5 out of 7 days Duration/Length of Stay: 07/15/19 Treatment/Interventions: Cognitive remediation/compensation;Cueing hierarchy;Dysphagia/aspiration precaution training;Functional tasks;Internal/external aids;Patient/family education;Speech/Language facilitation   Daily Session  Skilled Therapeutic Interventions:  Skilled treatment session focused on dysphagia and speech goals. Upon arrival, patient's trach was plugged and patient's vitals remained [facility] throughout session. Patient consumed trials of ice chips and utilized multiple swallows intermittently as well demonstrated an intermittent wet vocal quality. Patient utilized cued throat clears that required Min verbal and tactile cues for increased breath support and overall strength to clear residuals. Suspect patient may be ready for repeat MBS by end of this week to assess swallow function. Patient left upright in wheelchair with all needs within reach. Continue with current plan of care.      Pain No/Denies Pain   Therapy/Group: Individual Therapy  Shamond Skelton 07/11/2019, 6:52 AM

## 2019-07-11 NOTE — Patient Care Conference (Signed)
Inpatient RehabilitationTeam Conference and Plan of Care Update Date: 07/11/2019   Time: 10:15 AM    Patient Name: Michael Bright      Medical Record Number: [id number]  Date of Birth: [birth year] Sex: Male         Room/Bed: 4W15C/4W15C-01 Payor Info: Payor: [place] / Plan: BCBS COMM PPO / Product Type: *No Product type* /    Admit Date/Time:  06/27/2019  4:56 PM  Primary Diagnosis:  Stroke, small vessel [facility])  Patient Active Problem List   Diagnosis Date Noted  . Acute blood loss anemia   . Dysphagia due to recent cerebral infarction   . Diabetes mellitus type 2 in obese ([place])   . Morbid obesity ([employer])   . Tracheostomy dependent ([place])   . Status post insertion of percutaneous endoscopic gastrostomy (PEG) tube ([place])   . Stroke, small vessel ([place]) 06/27/2019  . Aspiration pneumonia ([place]) 06/14/2019  . Essential hypertension 06/14/2019  . Type 2 diabetes mellitus with hyperlipidemia ([place]) 06/14/2019  . Acute respiratory failure ([place])   . Acute CVA (cerebrovascular accident) ([place]) 05/30/2019  . 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 Dysphagia 05/30/2019  . Renal insufficiency 05/30/2019  . Diplopia   . Weakness     Expected Discharge Date: Expected Discharge Date: 07/15/19  Team Members Present: Physician leading conference: Dr. Alger Simons Social Worker Present: Ovidio Kin, LCSW Nurse Present: Ellison Carwin, LPN PT Present: Lavone Nian, PT OT Present: Laverle Hobby, OT SLP Present: Weston Anna, SLP PPS Coordinator present : Gunnar Fusi, SLP     Current Status/Progress Goal Weekly Team Focus  Bowel/Bladder   Continent of bladder/bowel LBM 07/10/19, I/O cath Q6 hr during night , bladder scan Q4-6 hrs  Encourge time toileting  Assees toileting needsespecially during night shift , encourage use of urinal   Swallow/Nutrition/ Hydration   NPO with PEG  Mod I for participation in dysphagia treatment   trials of ice chips, pharyngeal strenghening exercises, potential repeat MBS   ADL's   min A transfers, CGA ADLs overall, tolerating trach capped, improvement in secretion management  Supervision ADLs and transfers  functional activity tolerance, cardiorespiratory support, ADL transfers, ADLs   Mobility   min assist transfers, min assist gait with RW, mod assist 4 steps with rails, pt now with trach capped & on room air  supervision overall ambulatory household mobility  strengthening, endurance, gait, balance, upright tolerance, d/c planning, pt education   Communication   Capped, Mod I  Mod I  Possible plans for decannulation   Safety/Cognition/ Behavioral Observations  Min A  Supervision  problem solving, recall and awareness   Pain   Continue to verbalize discomfort with generalized pain and shouldeer pain,provied Tylenol po  < - 3  Assess QS and prn , with follow up   Skin   Patient has a trach and peg tube sites are unremarkable  maintain skin integrity,free of infection  QS assess and prn monitor for sign of infection      *See Care Plan and progress notes for long and short-term goals.     Barriers to Discharge  Current Status/Progress Possible Resolutions Date Resolved   Nursing                  PT  Trach;Nutrition means                 OT Lurline Idol  SLP                SW                Discharge Planning/Teaching Needs:  HOme with wife and children who can assist, will need family education prior to DC.      Team Discussion:  Trach capped last night according to MD did well. MBS once trach DC later this week. Will need CPAP at home MD to work on. Voids during the day and needed to be cath last night. Supervision level overall. Working toward discharge end of week.  Revisions to Treatment Plan:  DC 9/26    Medical Summary Current Status: trach plugged overnight, did well. ?decannulate today. CPAP HS, some urine retention, cognition better Weekly  Focus/Goal: see medical progress notes  Barriers to Discharge: Behavior;Medical stability   Possible Resolutions to Barriers: trach removal, CPAP at HS   Continued Need for Acute Rehabilitation Level of Care: The patient requires daily medical management by a physician with specialized training in physical medicine and rehabilitation for the following reasons: Direction of a multidisciplinary physical rehabilitation program to maximize functional independence : Yes Medical management of patient stability for increased activity during participation in an intensive rehabilitation regime.: Yes Analysis of laboratory values and/or radiology reports with any subsequent need for medication adjustment and/or medical intervention. : Yes   I attest that I was present, lead the team conference, and concur with the assessment and plan of the team. Teleconference held due to COVID 19   Edessa Jakubowicz, Lemar Livings 07/11/2019, 10:50 AM

## 2019-07-11 NOTE — Progress Notes (Signed)
Occupational Therapy Session Note  Patient Details  Name: Michael Bright MRN: [id number] Date of Birth: Nov 18, 1955  Today's Date: 07/11/2019 OT Individual Time: 1300-1400 OT Individual Time Calculation (min): 60 min    Short Term Goals: Week 2:  OT Short Term Goal 1 (Week 2): Pt will don pants with CGA OT Short Term Goal 2 (Week 2): Pt will require no more than 2 rest breaks in 30 min to manage secretions without suction OT Short Term Goal 3 (Week 2): Pt will complete standing level grooming task with all VSS for > 3 minutes  Skilled Therapeutic Interventions/Progress Updates:    pt received supine in bed, recently decannulated! SpO2 98% on RA. Mild air leakage around stoma. Pt completed 125 ft of functional mobility with RW to the therapy gym with CGA. Pt completed standing level dynamic reciprocal steoping with a 5in step to challenge dynamic standing balance during single leg stance and cardiorespiratory endurance, 3x 10 repetitions. Pt completed dynamic throwing and catching activity to reduce BUE reliance on RW. Pt then completed furniture transfer in the IADL apartment off of low, compliant couch, sit <> stand with min A. Pt then completed tub transfer using TTB. Pt able to transfer into shower with CGA. Once in shower, pt attempted to complete sit <> stand but was unable to achieve full upright posture and had close miss of slipping off TTB. Mod A provided to get pt back on TTB and mod vc for safety. Discussed safety in the shower overall. Pt returned to his room and was left sitting up with all needs met.   Therapy Documentation Precautions:  Precautions Precautions: Fall, Other (comment) Precaution Comments: PEG, watch BP, HR & O2, trach now capped & on room air (9/21) Required Braces or Orthoses: Other Brace Other Brace: aircast on LLE during gait Restrictions Weight Bearing Restrictions: No   Therapy/Group: Individual Therapy  Curtis Sites 07/11/2019, 7:13 AM

## 2019-07-11 NOTE — Progress Notes (Signed)
Nutrition Follow-up  DOCUMENTATION CODES:   Morbid obesity  INTERVENTION:   Continue bolus tube feeds via PEG: - Osmolite 1.5 cal 474 ml (2 cartons/ARCs) TID - Pro-stat 30 ml daily - Free water per MD/PA, currently 200 ml QID  Bolus tube feeding regimen and current free water provides 2233 kcal, 104 grams of protein, and 1884 ml of H2O.  NUTRITION DIAGNOSIS:   Inadequate oral intake related to dysphagia as evidenced by NPO status.  Ongoing, being addressed via TF  GOAL:   Patient will meet greater than or equal to 90% of their needs  Met via TF  MONITOR:   Labs, Weight trends, TF tolerance, I & O's  REASON FOR ASSESSMENT:   Consult Enteral/tube feeding initiation and management  ASSESSMENT:   63 year old male with PMH of T2DM, morbid obesity. Pt was [facility] to [facility] on [facility]19 with two day history of left facial weakness with numbness, left gaze preference, LLE weakness with numbness, and difficulty swallowing saliva. 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 Pt had a decline in respiratory status with increase WOB felt to be due aspiration and inability to handle secretions and was transferred to ICU for closer montioring. Pt required intubation 8/15. MSSA PNA treated with cefazolin. Trach placed on 8/21 and transitioned to ATC by 8/23. Pt has been NPO and was maintained on tube feeds for nutritional support. Pt has been tolerating PMSV and MBS done showing frank aspiration of all thins before, during, and after swallow with consistent cough response. Pt admitted to CIR on 9/08.  9/10 - trach downsized to #4 Shiley cuffless 9/15 - s/p PEG placement  Noted target d/c date of 9/26.  Per CCM, plan to d/c trach.  Weight fairly stable since admit to rehab, overall up 6 lbs but has fluctuated since admit.  Noted plan for repeat MBS on Thursday.  Spoke with RN who reports pt tolerating TF without issue.  Spoke with pt and bedside who confirms. Pt states that he has less acid reflux when boluses are given to gravity vs pushed in via syringe.  Current TF: Osmolite 1.5 cal 474 ml TID, Pro-stat 30 ml BID, free water 200 ml QID  Medications reviewed and include: Pepcid, SSI, Lantus 33 units daily  Labs reviewed. CBG's: 112-208 x 24 hours  Diet Order:   Diet Order            Diet NPO time specified  Diet effective now              EDUCATION NEEDS:   No education needs have been identified at this time  Skin:  Skin Assessment: Reviewed RN Assessment (MASD to abdomen, groin)  Last BM:  07/10/19  Height:   Ht Readings from Last 1 Encounters:  06/30/19 '6\' 1"'$  (1.854 m)    Weight:   Wt Readings from Last 1 Encounters:  07/11/19 (!) 146.2 kg    Ideal Body Weight:  83.6 kg  BMI:  Body mass index is 42.52 kg/m.  Estimated Nutritional Needs:   Kcal:  2100-2300  Protein:  105-120 grams  Fluid:  2.0 L    Gaynell Face, MS, RD, LDN Inpatient Clinical Dietitian Pager: [phone] Weekend/After Hours: [phone]

## 2019-07-11 NOTE — Progress Notes (Signed)
RT was asked by pt to please set up his home CPAP for him. RT assembled and placed at bedside for pt. RT assisted pt with placement and added sterile water to pts humidity chamber. Pt states he believes his home setting is 4cm H2O with no O2 bled into the system. Pt respiratory status is stable at this time. Pt was decanulated today and stoma dressed, no drainage, dressing clean and dry. RT will continue to monitor.

## 2019-07-11 NOTE — Progress Notes (Signed)
Social Work Patient ID: Michael Bright, male   DOB: 1956-05-18, 63 y.o.   MRN: [id number]     Diagnosis codes: I63.9, Z93.0, E13.2 & J69.0  Height:   6'0             Weight: 319 lbs           Patient suffers from CVA  which impairs his ability to perform daily activities like ADL's and tolieting   in the home.  A walker  will not resolve issue with performing activities of daily living.  A wheelchair will allow patient to safely perform daily activities.  Patient is able to propel themselves in the home using a standard weight wheelchair.

## 2019-07-11 NOTE — Progress Notes (Signed)
[place] PHYSICAL MEDICINE & REHABILITATION PROGRESS NOTE   Subjective/Complaints: No issues overnight. Tolerated trach plugging/CPAP well. RN reports urine retention at night still  ROS: Patient denies fever, rash, sore throat, blurred vision, nausea, vomiting, diarrhea, cough, shortness of breath or chest pain, joint or back pain, headache, or mood change.   Objective:   No results found. Recent Labs    07/10/19 0655  WBC 5.6  HGB 11.7*  HCT 35.2*  PLT 211   Recent Labs    07/10/19 0655  NA 140  K 4.0  CL 105  CO2 28  GLUCOSE 88  BUN 11  CREATININE 0.69  CALCIUM 8.7*    Intake/Output Summary (Last 24 hours) at 07/11/2019 1050 Last data filed at 07/11/2019 0017 Gross per 24 hour  Intake -  Output 425 ml  Net -425 ml     Physical Exam: Vital Signs Blood pressure 127/76, pulse 84, temperature 98 F (36.7 C), resp. rate 18, height 6\' 1"  (1.854 m), weight (!) 146.2 kg, SpO2 97 %. Constitutional: No distress . Vital signs reviewed. HEENT: EOMI, oral membranes moist Neck: supple, trach plugged, O2 via [place] Cardiovascular: RRR without murmur. No JVD    Respiratory: CTA Bilaterally without wheezes or rales. Normal effort    GI: BS +, non-tender, non-distended, PEG clean Skin: Warm and dry.  Intact. Psych: Normal mood.  Normal behavior. Musc: No edema in extremities.  No tenderness in extremities. Neurological: Alert and follows all commands.  Motor: 4+/5 throughout, unchanged  Assessment/Plan: 1. 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  Physiatrist is providing close team supervision and 24 hour management of active medical problems listed below.  Physiatrist and rehab team continue to assess barriers to discharge/monitor patient progress toward functional and medical goals  Care Tool:  Bathing    Body parts bathed by patient: Right arm, Left  arm, Abdomen, Chest, Right upper leg, Left upper leg, Face, Front perineal area   Body parts bathed by helper: Buttocks     Bathing assist Assist Level: Minimal Assistance - Patient > 75%     Upper Body Dressing/Undressing Upper body dressing   What is the patient wearing?: Pull over shirt    Upper body assist Assist Level: Set up assist    Lower Body Dressing/Undressing Lower body dressing      What is the patient wearing?: Pants, Underwear/pull up     Lower body assist Assist for lower body dressing: Minimal Assistance - Patient > 75%     Toileting Toileting    Toileting assist Assist for toileting: Minimal Assistance - Patient > 75%     Transfers Chair/bed transfer  Transfers assist  Chair/bed transfer activity did not occur: Safety/medical concerns  Chair/bed transfer assist level: Minimal Assistance - Patient > 75%     Locomotion Ambulation   Ambulation assist   Ambulation activity did not occur: Safety/medical concerns  Assist level: Minimal Assistance - Patient > 75% Assistive device: Walker-rolling Max distance: 50'   Walk 10 feet activity   Assist  Walk 10 feet activity did not occur: Safety/medical concerns  Assist level: Minimal Assistance - Patient > 75% Assistive device: Walker-rolling, Orthosis   Walk 50 feet activity   Assist Walk 50 feet with 2 turns activity did not occur: Safety/medical concerns  Assist level: Minimal Assistance - Patient > 75% Assistive device: Walker-rolling, Orthosis    Walk 150 feet activity   Assist  Walk 150 feet activity did not occur: Safety/medical concerns  Assist level: Minimal Assistance - Patient > 75% Assistive device: Walker-rolling    Walk 10 feet on uneven surface  activity   Assist Walk 10 feet on uneven surfaces activity did not occur: Safety/medical concerns         Wheelchair     Assist Will patient use wheelchair at discharge?: (TBD) Type of Wheelchair: Manual     Wheelchair assist level: Supervision/Verbal cueing Max wheelchair distance: 50    Wheelchair 50 feet with 2 turns activity    Assist    Wheelchair 50 feet with 2 turns activity did not occur: Safety/medical concerns   Assist Level: Supervision/Verbal cueing   Wheelchair 150 feet activity     Assist          Blood pressure 127/76, pulse 84, temperature 98 F (36.7 C), resp. rate 18, height 6\' 1"  (1.854 m), weight (!) 146.2 kg, SpO2 97 %.  Medical Problem List and Plan: 1. Reduced Mobility, swllow function and self care secondary to left lateral medullary infarct, Wallenberg type syndrome, course complicated by multiple medical issues and prolonged hospital stay  Continue CIR therapies  -team conference today 2. Antithrombotics: -DVT/anticoagulation:Pharmaceutical:Lovenox -antiplatelet therapy: ASA.   Resumed Plavix 3. Pain Management:n/A 4. Mood/delirium:  -antipsychotic agents:       Continue HS seroquel at 2100     Continue current dose  -celexa added for anxiety  5. Neuropsych: This patientis ?not fullycapable of making decisions onhisown behalf. 6. Skin/Wound Care:Routine pressure relief measures. 7. Fluids/Electrolytes/Nutrition:  BMP within acceptable range on 9/21 8. T2DM: Hgb A1c-10.6. Now on lantus with SSI for elevated BS. Continue to monitor BS every 4 hours.   continue  lantus  33u CBG (last 3)  Recent Labs    07/10/19 1701 07/10/19 2113 07/11/19 0618  GLUCAP 112* 208* 132*   Improving control. Still higher in later PM hours  -titrated lantus to 36u qam 9/21 9. Recurrent PNA--H flu on 9/7: Has been on prolonged antibiotics--narrowed to rocephin on 9/8-9/12  -afebrile currently  -9/10 CXR without acute disease. 10. Respiratory failure s/p trach/OSA:     - TID robinul to help manage secretions.  -downsized to #4 trach 9/10.     -has significant OSA. CPAP recommended. May have difficulty acquiring machine  without sleep study   -tolerated HS plugging.   -CCM to follow up today re: possible decannulation 11. Tachycardia:   Resolved  12. Severe dysphagia: severe pharyngeal dysphagia  - PEG placed by IR on 9/15  -tolerating bolus feeds  -potentially repeat MBS on Thursday after Trach out 13. Brief unresponsive episodes:?  Resolved  -likely vasovagal events related to trach suctioning  -EEG performed and did not demonstrate seizure activity  -Continue to monitor  14.  Morbid obesity  Continue to encourage weight loss 15.  Acute blood loss anemia  Hemoglobin 11.7 9/21  LOS: 14 days A FACE TO FACE EVALUATION WAS PERFORMED  Meredith Staggers 07/11/2019, 10:50 AM

## 2019-07-11 NOTE — Progress Notes (Addendum)
Physical Therapy Session Note  Patient Details  Name: Michael Bright MRN: [id number] Date of Birth: 03/09/1956  Today's Date: 07/11/2019 PT Individual Time: 1431-1528 PT Individual Time Calculation (min): 57 min   Short Term Goals: Week 2:  PT Short Term Goal 1 (Week 2): Pt will be able to perform functional transfers with CGA PT Short Term Goal 2 (Week 2): Pt will be able to gait x 100' with CGA PT Short Term Goal 3 (Week 2): Pt will be able to perform 4 stairs with rails with min assist for functional strengthening  Skilled Therapeutic Interventions/Progress Updates:  Received pt sitting in chair. Trach removed, pt on room air. Pt agreeable to PT. Today's session focused on LE strength and mobility, balance, improved activity tolerance, gait, and functional transfers. Pt ambulated 70ft along ramp with RW min A with verbal cueing for slow pace when descending ramp. Pt's L LE was weaker than normal during today's session as noted by pt's c/o LLE fatigue & pt requiring more assistance for balance 2/2 L lateral lean during gait. Pt performed TUG using RW and min A for balance. 3 trials were completed for an average to 50.3 seconds. Pt performed WC mobility for 128ft (171ft using just LEs and 88ft using bilateral UE's and LE's) with verbal cueing for heel placement on floor to propel WC forward. Pt performed seated (3x20 reps on resistance 20) and standing exercises (x10 reps) using Kinetron but L LE fatigued quickly when standing and pt became fearful so returned to sitting. Concluded session with pt sitting in WC, chair alarm on, all needs within reach, and NT present taking BP.   Therapy Documentation Precautions:  Precautions Precautions: Fall, Other (comment) Precaution Comments: PEG, watch BP, HR & O2, trach now capped & on room air (9/21) Required Braces or Orthoses: Other Brace Other Brace: aircast on LLE during gait Restrictions Weight Bearing Restrictions: No    Pain:  Pt denied any  pain during today's session.         Therapy/Group: Individual Therapy  [place], DPT   Lavone Nian, PT, DPT  07/11/2019, 3:55 PM

## 2019-07-11 NOTE — Progress Notes (Signed)
Social Work Patient ID: Michael Bright, male   DOB: 05-07-1956, 64 y.o.   MRN: [id number]  Met with pt and spoke with wife via telephone to discuss team conference progression in therapies and discharge still targeted for 9/26. Pt has a CPAP at home he has not used for several years. Wife will bring it in today when she is visiting. Discussed family education and have scheduled for Friday with she and their son for 9:00-12:00. Will let therapy team know.

## 2019-07-12 ENCOUNTER — Inpatient Hospital Stay (HOSPITAL_COMMUNITY): Payer: Medicare Other | Admitting: Speech Pathology

## 2019-07-12 ENCOUNTER — Inpatient Hospital Stay (HOSPITAL_COMMUNITY): Payer: Medicare Other | Admitting: Physical Therapy

## 2019-07-12 ENCOUNTER — Inpatient Hospital Stay (HOSPITAL_COMMUNITY): Payer: Medicare Other | Admitting: Psychology

## 2019-07-12 ENCOUNTER — Inpatient Hospital Stay (HOSPITAL_COMMUNITY): Payer: Medicare Other

## 2019-07-12 LAB — GLUCOSE, CAPILLARY
Glucose-Capillary: 131 mg/dL — ABNORMAL HIGH (ref 70–99)
Glucose-Capillary: 101 mg/dL — ABNORMAL HIGH (ref 70–99)
Glucose-Capillary: 158 mg/dL — ABNORMAL HIGH (ref 70–99)
Glucose-Capillary: 193 mg/dL — ABNORMAL HIGH (ref 70–99)

## 2019-07-12 MED ORDER — OSMOLITE 1.5 CAL PO LIQD
474.0000 mL | Freq: Three times a day (TID) | ORAL | Status: DC
  Administered 2019-07-12 (×2): 474 mL
  Administered 2019-07-13 (×3): 237 mL
  Administered 2019-07-14 (×3): 474 mL
  Administered 2019-07-15 (×2): 237 mL
  Administered 2019-07-15: 474 mL
  Administered 2019-07-16 (×2): 237 mL
  Administered 2019-07-16: 355 mL
  Administered 2019-07-17: 474 mL
  Filled 2019-07-12 (×13): qty 474

## 2019-07-12 MED ORDER — BETHANECHOL CHLORIDE 10 MG PO TABS: 10.0000 mg | ORAL_TABLET | Freq: Four times a day (QID) | ORAL | Status: DC

## 2019-07-12 MED ORDER — BETHANECHOL CHLORIDE 10 MG PO TABS
10.0000 mg | ORAL_TABLET | Freq: Four times a day (QID) | ORAL | Status: DC
  Administered 2019-07-12 – 2019-07-14 (×8): 10 mg
  Filled 2019-07-12 (×10): qty 1

## 2019-07-12 NOTE — Progress Notes (Signed)
[place] PHYSICAL MEDICINE & REHABILITATION PROGRESS NOTE   Subjective/Complaints: Pt with secretions using oral suctioning, on cont oximetry in room but has been decanuulated   ROS: Patient denies, nausea, vomiting, diarrhea, cough, shortness of breath or chest pain, joint or back pain, headache, or mood change.   Objective:   No results found. Recent Labs    07/10/19 0655  WBC 5.6  HGB 11.7*  HCT 35.2*  PLT 211   Recent Labs    07/10/19 0655  NA 140  K 4.0  CL 105  CO2 28  GLUCOSE 88  BUN 11  CREATININE 0.69  CALCIUM 8.7*    Intake/Output Summary (Last 24 hours) at 07/12/2019 0853 Last data filed at 07/12/2019 0525 Gross per 24 hour  Intake -  Output 2000 ml  Net -2000 ml     Physical Exam: Vital Signs Blood pressure 120/68, pulse 71, temperature 98.3 F (36.8 C), temperature source Oral, resp. rate 14, height 6\' 1"  (1.854 m), weight (!) 146.2 kg, SpO2 98 %. Constitutional: No distress . Vital signs reviewed. HEENT: EOMI, oral membranes moist Neck: supple, trach site healing no air leak  Cardiovascular: RRR without murmur. No JVD    Respiratory: CTA Bilaterally without wheezes or rales. Normal effort    GI: BS +, non-tender, non-distended, PEG clean Skin: Warm and dry.  Intact. Psych: Normal mood.  Normal behavior. Musc: No edema in extremities.  No tenderness in extremities. Neurological: Alert and follows all commands.  Motor: 4+/5 throughout, unchanged  Assessment/Plan: 1. 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  Physiatrist is providing close team supervision and 24 hour management of active medical problems listed below.  Physiatrist and rehab team continue to assess barriers to discharge/monitor patient progress toward functional and medical goals  Care Tool:  Bathing    Body parts bathed by patient: Right arm, Left arm,  Abdomen, Chest, Right upper leg, Left upper leg, Face, Front perineal area   Body parts bathed by helper: Buttocks     Bathing assist Assist Level: Minimal Assistance - Patient > 75%     Upper Body Dressing/Undressing Upper body dressing   What is the patient wearing?: Pull over shirt    Upper body assist Assist Level: Set up assist    Lower Body Dressing/Undressing Lower body dressing      What is the patient wearing?: Pants, Underwear/pull up     Lower body assist Assist for lower body dressing: Minimal Assistance - Patient > 75%     Toileting Toileting    Toileting assist Assist for toileting: Minimal Assistance - Patient > 75%     Transfers Chair/bed transfer  Transfers assist  Chair/bed transfer activity did not occur: Safety/medical concerns  Chair/bed transfer assist level: Minimal Assistance - Patient > 75%     Locomotion Ambulation   Ambulation assist   Ambulation activity did not occur: Safety/medical concerns  Assist level: Minimal Assistance - Patient > 75% Assistive device: Walker-rolling Max distance: 20 ft   Walk 10 feet activity   Assist  Walk 10 feet activity did not occur: Safety/medical concerns  Assist level: Minimal Assistance - Patient > 75% Assistive device: Walker-rolling, Orthosis   Walk 50 feet activity   Assist Walk 50 feet with 2 turns activity did not occur: Safety/medical concerns  Assist level: Minimal Assistance - Patient > 75% Assistive device: Walker-rolling, Orthosis    Walk 150 feet activity  Assist Walk 150 feet activity did not occur: Safety/medical concerns  Assist level: Minimal Assistance - Patient > 75% Assistive device: Walker-rolling    Walk 10 feet on uneven surface  activity   Assist Walk 10 feet on uneven surfaces activity did not occur: Safety/medical concerns   Assist level: Minimal Assistance - Patient > 75% Assistive device: Walker-rolling, Orthosis   Wheelchair     Assist  Will patient use wheelchair at discharge?: (TBD) Type of Wheelchair: Manual    Wheelchair assist level: Supervision/Verbal cueing Max wheelchair distance: 130ft    Wheelchair 50 feet with 2 turns activity    Assist    Wheelchair 50 feet with 2 turns activity did not occur: Safety/medical concerns   Assist Level: Supervision/Verbal cueing   Wheelchair 150 feet activity     Assist      Assist Level: Supervision/Verbal cueing   Blood pressure 120/68, pulse 71, temperature 98.3 F (36.8 C), temperature source Oral, resp. rate 14, height 6\' 1"  (1.854 m), weight (!) 146.2 kg, SpO2 98 %.  Medical Problem List and Plan: 1. Reduced Mobility, swllow function and self care secondary to left lateral medullary infarct, Wallenberg type syndrome, course complicated by multiple medical issues and prolonged hospital stay  Continue CIR therapies  -team conference today 2. Antithrombotics: -DVT/anticoagulation:Pharmaceutical:Lovenox -antiplatelet therapy: ASA.   Resumed Plavix 3. Pain Management:n/A 4. Mood/delirium:  -antipsychotic agents:       Continue HS seroquel at 2100     Continue current dose  -celexa added for anxiety  5. Neuropsych: This patientis ?not fullycapable of making decisions onhisown behalf. 6. Skin/Wound Care:Routine pressure relief measures. 7. Fluids/Electrolytes/Nutrition:  BMP within acceptable range on 9/21 8. T2DM: Hgb A1c-10.6. Now on lantus with SSI for elevated BS. Continue to monitor BS every 4 hours.   continue  lantus  33u CBG (last 3)  Recent Labs    07/11/19 1702 07/11/19 2123 07/12/19 0644  GLUCAP 85 141* 131*   Improving control. 9/23 9. Recurrent PNA--H flu on 9/7: Has been on prolonged antibiotics--narrowed to rocephin on 9/8-9/12  -afebrile currently  -9/10 CXR without acute disease. 10. Respiratory failure s/p trach/OSA:     - TID robinul to help manage secretions.     -successful  decannulation may d/c cont pulse ox   Order flutter valve 11. Tachycardia:   Resolved  12. Severe dysphagia: severe pharyngeal dysphagia  - PEG placed by IR on 9/15  -tolerating bolus feeds  -potentially repeat MBS on Thursday after Trach out 13. Brief unresponsive episodes:?  Resolved  -likely vasovagal events related to trach suctioning  -EEG performed and did not demonstrate seizure activity  -Continue to monitor  14.  Morbid obesity  Continue to encourage weight loss 15.  Acute blood loss anemia  Hemoglobin 11.7 9/21  LOS: 15 days A FACE TO FACE EVALUATION WAS PERFORMED  Michael Bright 07/12/2019, 8:53 AM

## 2019-07-12 NOTE — Progress Notes (Signed)
Orthopedic Tech Progress Note Patient Details:  Michael Bright January 02, 1956 [id number] Called in order to [employer] for a AFO Patient ID: Michael Bright, male   DOB: [birth year], 63 y.o.   MRN: [id number]   Michael Bright 07/12/2019, 10:37 AM

## 2019-07-12 NOTE — Progress Notes (Signed)
Speech Language Pathology Daily Session Note  Patient Details  Name: Michael Bright MRN: [id number] Date of Birth: Feb 27, 1956  Today's Date: 07/12/2019 SLP Individual Time: 1400-1450 SLP Individual Time Calculation (min): 50 min  Short Term Goals: Week 3: SLP Short Term Goal 1 (Week 3): STGs=LTGs due to ELOS  Skilled Therapeutic Interventions: Skilled treatment session focused on dysphagia goals. Patient was decannulated yesterday and patient's vitals remained [facility] throughout session. Patient consumed trials of ice chips and utilized multiple swallows intermittently as well demonstrated an intermittent wet vocal quality. Patient utilized throat clears that required supervision verbal and tactile cues for increased breath support and overall strength to clear residuals. Patient also performed pharyngeal strengthening exercises with overall supervision verbal cues for accuracy. Plan for repeat MBS by end of this week to assess swallow function. Patient left upright in wheelchair with all needs within reach. Continue with current plan of care.           Pain    Therapy/Group: Individual Therapy  Aliese Brannum 07/12/2019, 2:54 PM

## 2019-07-12 NOTE — Progress Notes (Signed)
Occupational Therapy Weekly Progress Note  Patient Details  Name: Michael Bright MRN: [id number] Date of Birth: [birth year]  Beginning of progress report period: July 05, 2019 End of progress report period: July 12, 2019  Today's Date: 07/12/2019 OT Individual Time: 1779-3903 OT Individual Time Calculation (min): 60 min    Patient has met 3 of 3 short term goals.  Pt has made excellent progress in his OT POC. He is now able to transfer with CGA using the RW. He is able to carryover education from session to session re safety and UE placement during transfers. Pt has been successfully decannulated and is tolerating RA and stoma occlusion nicely. Family education has been scheduled with pt's wife and he is on track for d/c.   Patient continues to demonstrate the following deficits: muscle weakness, decreased cardiorespiratoy endurance and decreased oxygen support and decreased standing balance, decreased postural control and decreased balance strategies and therefore will continue to benefit from skilled OT intervention to enhance overall performance with BADL.  Patient progressing toward long term goals..  Continue plan of care.  OT Short Term Goals Week 2:  OT Short Term Goal 1 (Week 2): Pt will don pants with CGA OT Short Term Goal 1 - Progress (Week 2): Met OT Short Term Goal 2 (Week 2): Pt will require no more than 2 rest breaks in 30 min to manage secretions without suction OT Short Term Goal 2 - Progress (Week 2): Met OT Short Term Goal 3 (Week 2): Pt will complete standing level grooming task with all VSS for > 3 minutes OT Short Term Goal 3 - Progress (Week 2): Met Week 3:  OT Short Term Goal 1 (Week 3): STG= LTG d.t ELOS  Skilled Therapeutic Interventions/Progress Updates:    Pt received supine in bed, eager for shower. Pt completed bed mobility with min A, before using RW to transfer to w/c with CGA. Pt was transported to tub room to simulate home environment. Pt  completed short distance functional mobility to the tub and he transferred into tub using TTB with CGA, cueing for safety. Pt completed UB and LB bathing seated with (S) from TTB. CGA provided during standing level posterior hygiene. Pt with increased secretions from shower, edu provided re energy conservation techniques and impact of humidity on secretions. Pt cued for rest breaks as well. Pt donned underwear and pants with CGA from TTB. Pt donned shirt with (S). Pt returned to the w/c with CGA using RW. Pt was transported back to room and left sitting up in the w/c with all needs met. Suction provided. All VSS throughout session.   Therapy Documentation Precautions:  Precautions Precautions: Fall, Other (comment) Precaution Comments: PEG, watch BP, HR & O2, trach now capped & on room air (9/21) Required Braces or Orthoses: Other Brace Other Brace: aircast on LLE during gait Restrictions Weight Bearing Restrictions: No   Therapy/Group: Individual Therapy  Curtis Sites 07/12/2019, 10:53 AM

## 2019-07-12 NOTE — Progress Notes (Signed)
Physical Therapy Session Note  Patient Details  Name: Michael Bright MRN: [id number] Date of Birth: 1956-07-13  Today's Date: 07/12/2019 PT Individual Time: 1002-1120 PT Individual Time Calculation (min): 78 min   Short Term Goals: Week 2:  PT Short Term Goal 1 (Week 2): Pt will be able to perform functional transfers with CGA PT Short Term Goal 2 (Week 2): Pt will be able to gait x 100' with CGA PT Short Term Goal 3 (Week 2): Pt will be able to perform 4 stairs with rails with min assist for functional strengthening  Skilled Therapeutic Interventions/Progress Updates:    Received pt sitting in WC, pt agreeable to PT today. Today's session focused on gait biomechanics, LE strength, and improved endurance to activity. Pt ambulated 183ft with RW min A with aircast donned on LLE. Pt continues to struggle with keeping LLE flat on floor contributing to loss of balance to the L when standing. O2 sat was 93% and HR 110bpm after ambulating. Pt ambulated with anterior support hybrid spiral AFO and aircast 168ft x 3 trials with RW min A. Pt demonstrated more stability and decreased L lateral lean when ambulating with the AFO with pt reporting improved stability during standing/gait. Pt performed mini squats, standing HS curls on RLE, and hip abd on RLE (2x10) while holding onto railing for support with CGA. During seated rest breaks pt performed 4x10 ball squeezes and was educated on techniques to assist with muscle fatigue, energy conservation strategies, and exercise dosing for HEP. Pt performed WC mobility 3ft using bilateral LE's with supervision and verbal cueing to use heels to propel chair. [employer] account manager) arrived for AFO consult with recommendation of anterior support hybrid spiral AFO for LLE without aircast; he will bring pt's personal AFO today or tomorrow. Concluded session with pt sitting in WC, chair pad alarm on, and all needs within reach.   HEP printout was provided to patient and a  copy will be put in patient chart.  Therapy Documentation Precautions:  Precautions Precautions: Fall, Other (comment) Precaution Comments: PEG, watch BP, HR & O2, trach now capped & on room air (9/21) Required Braces or Orthoses: Other Brace Other Brace: aircast on LLE during gait Restrictions Weight Bearing Restrictions: No  Pain: Pt denied any pain during today's session  Therapy/Group: Individual Therapy  [place], DPT   Lavone Nian, PT, DPT  07/12/2019, 11:57 AM

## 2019-07-12 NOTE — Consult Note (Signed)
Neuropsychological Consultation   Patient:   Michael Bright   DOB:   [birth year]  MR Number:  [id number]  Location:  [facility] [address] CENTER A [address] [id number] [place] [address] Dept: [phone] Loc: [phone]           Date of Service:   07/12/2019  Start Time:   1 PM End Time:   2 PM  Provider/Observer:  Arley Phenix, Psy.D.       Clinical Neuropsychologist       Billing Code/Service: [phone]  Chief Complaint:    Michael Bright is a 63 year old male with a history of type 2 diabetes, morbid obesity- BMI 42 and reports of lack of medical care for the greater than 10 years.  The patient was admitted to [facility] on 05/30/2019 with 2-day history of left facial weakness and numbness, left gaze preference, lower left extremity weakness with numbness and difficulty swallowing saliva.  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  The patient had decline in respiratory status with increased weakness of breath felt to be due to aspiration and inability to handle secretions and was subsequently transferred to ICU for closer monitoring.  The patient also had infection that was treated with antibiotics for 5 days.  The patient has had times of issues with delirium and paranoia as well as vomiting episodes due to TF with recurrent fever.  The patient developed respiratory distress on 9/2.  The patient's cognitive functioning has improved with a reduction of paranoid ideation and confusion.  The patient does have a history of obstructive sleep apnea with CPAP use and was not always compliant in the past.  Patient referred for comprehensive inpatient rehabilitation services due to functional decline once his medical status improved and his cognition is improved.  Reason for Service:  Michael Bright is a 63 year oldRH malewith history of T2DM, morbid obesity--BMI 42 and reports of lack of medical care for >10  years who was admitted to [facility] on 05/30/19 with two day history of left facial weakness with numbness, left gaze preference, LLE weakness with numbness and difficulty swallowing saliva. He was found to have AKI with low grade fever and MRI brain done revealing acute small left medullary CVA. 2D echo showed EF of 60-65% with He had decline in respiratory status with increase WOB felt to be due aspiration and inability to handle secretions and was transferred to ICU for closer montioring. Scopolamine patch and robinol prn added to help with secretions but required intubation 8/15 due to fevers from suspected aspiration event. MSSA PNA treated with cefazolin X 5 days. Trach placed by Dr. Myrla Halsted on 8/21 and transitioned to ATC by 8/23.   He has been NPO and was maintained on tube feeds for nutritional support. He has had issue with delirium and paranoia as well as vomiting episodes due to TF with recurrent fevers 8/24 and treated with Unasyn-->keflex for tracheobronchitis. He continues to have copious secretions and chest PT. He did develop respiratory distress with AMS on 9/2 and IV robinul as well as pulmicort nebs added. CXR negative for PNA but he has had increase in secretions therefore Keflex changed to Unasyn on 09/06 due to concerns of aspiration PNA. Atropin SL used additionally X 2 days to help with secretions. He has been tolerating PMSV and MBS done today today showing frank aspiration of all thins before, during and after swallow with consistent cough response.Unasyn narrowed to Rocephintodaywith recommendations  to completeadditional 5 days treatment.Blood pressure control is improving with addition of Norvasc. Mentation has improved with addition of Seroquel. PCCM felt that patient with severe OSA and will need CPAP once decannualted. 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 Therapy ongoing and patient limited by LLE  instability, tachycardia--HR in 130's with activity, severe dysphagia and some expressive deficit.CIR recommended due to functional decline.  Current Status:  The patient is making significant improvements of progress over the past several days.  The patient was well oriented today with good mental status.  The patient had been placed on Seroquel due to confusion and paranoid ideation.  The symptoms have improved and he is now being weaned off of the Seroquel.  The patient does continue to take Celexa and will assess this medication going forward.  The patient reports that his mood is good and that he denied any significant anxiety or paranoia today.  The patient did acknowledge frustration and realization that he had not been taking care of himself or following medical advice at all in fact he had not seen a doctor for more than 10 years.  Behavioral Observation: Michael Bright  presents as a 63 y.o.-year-old Right African American Male who appeared his stated age. his dress was Appropriate and he was Well Groomed and his manners were Appropriate to the situation.  his participation was indicative of Appropriate and Redirectable behaviors.  There were any physical disabilities noted.  he displayed an appropriate level of cooperation and motivation.     Interactions:    Active Appropriate and Redirectable  Attention:   abnormal and attention span appeared shorter than expected for age  Memory:   within normal limits; recent and remote memory intact  Visuo-spatial:  not examined  Speech (Volume):  normal  Speech:   normal; slurred  Thought Process:  Coherent and Relevant  Though Content:  WNL; not suicidal and not homicidal  Orientation:   person, place, time/date and situation  Judgment:   Good  Planning:   Fair  Affect:    Appropriate  Mood:    Anxious  Insight:   Good  Intelligence:   normal   Medical History:   Past Medical History:  Diagnosis Date  . Diabetes [facility])      Psychiatric History:  The patient denies any prior psychiatric history.  Family Med/Psych History: History reviewed. No pertinent family history.  Impression/DX:  Michael Bright is a 63 year old male with a history of type 2 diabetes, morbid obesity- BMI 42 and reports of lack of medical care for the greater than 10 years.  The patient was admitted to [facility] on 05/30/2019 with 2-day history of left facial weakness and numbness, left gaze preference, lower left extremity weakness with numbness and difficulty swallowing saliva.  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  The patient had decline in respiratory status with increased weakness of breath felt to be due to aspiration and inability to handle secretions and was subsequently transferred to ICU for closer monitoring.  The patient also had infection that was treated with antibiotics for 5 days.  The patient has had times of issues with delirium and paranoia as well as vomiting episodes due to TF with recurrent fever.  The patient developed respiratory distress on 9/2.  The patient's cognitive functioning has improved with a reduction of paranoid ideation and confusion.  The patient does have a history of obstructive sleep apnea with  CPAP use and was not always compliant in the past.  Patient referred for comprehensive inpatient rehabilitation services due to functional decline once his medical status improved and his cognition is improved.  The patient is making significant improvements of progress over the past several days.  The patient was well oriented today with good mental status.  The patient had been placed on Seroquel due to confusion and paranoid ideation.  The symptoms have improved and he is now being weaned off of the Seroquel.  The patient does continue to take Celexa and will assess this medication going forward.  The patient reports that his mood is good and that he denied any significant anxiety or paranoia today.  The  patient did acknowledge frustration and realization that he had not been taking care of himself or following medical advice at all in fact he had not seen a doctor for more than 10 years.          Electronically Signed   _______________________ Arley Phenix, Psy.D.

## 2019-07-13 ENCOUNTER — Inpatient Hospital Stay (HOSPITAL_COMMUNITY): Payer: BC Managed Care – PPO

## 2019-07-13 ENCOUNTER — Encounter (HOSPITAL_COMMUNITY): Payer: Medicare Other | Admitting: Speech Pathology

## 2019-07-13 ENCOUNTER — Inpatient Hospital Stay (HOSPITAL_COMMUNITY): Payer: Medicare Other

## 2019-07-13 LAB — GLUCOSE, CAPILLARY
Glucose-Capillary: 105 mg/dL — ABNORMAL HIGH (ref 70–99)
Glucose-Capillary: 165 mg/dL — ABNORMAL HIGH (ref 70–99)
Glucose-Capillary: 104 mg/dL — ABNORMAL HIGH (ref 70–99)
Glucose-Capillary: 118 mg/dL — ABNORMAL HIGH (ref 70–99)
Glucose-Capillary: 136 mg/dL — ABNORMAL HIGH (ref 70–99)

## 2019-07-13 NOTE — Progress Notes (Signed)
Occupational Therapy Session Note  Patient Details  Name: Michael Bright MRN: [id number] Date of Birth: [birth year]  Today's Date: 07/13/2019 OT Individual Time: 0800-0900 OT Individual Time Calculation (min): 60 min    Short Term Goals: Week 3:  OT Short Term Goal 1 (Week 3): STG= LTG d.t ELOS  Skilled Therapeutic Interventions/Progress Updates:  Pt received in bed supine and reported he was coughing this morning. Pt with no c/o pain and agreeable to tx plan. Pt comes EOB with (S) and is min A for sit <> stand EOB. Pt performed UB bathing at sink level while seated in w/c with (S) and completed UB dressing with (S). Pt doff and don socks with (S) and is mod A for donning shoes this date d/t AFO on L LE. OT provided pt with shoe buttons and demonstrated purpose and use of buttons for donning shoes; added coband onto shoelaces for visual cue when donning shoes. Pt able to return demonstrate shoe button use. Pt functionally ambulated with dayroom with RW and CGA. Pt engaged in wii activity to address functional sit <> stand transfers and dynamic standing balance. Pt completed sit <>stand to perform unilateral functional standing task with min A for balance and min VC for foot placement. Pt transported back to room and transfers w/c <> EOB with RW and CGA. OT doffed shoes and AFO for time management. Exited session with pt in bed and RN ready for pt to be transported to swallow test.      Therapy Documentation Precautions:  Precautions Precautions: Fall, Other (comment) Precaution Comments: PEG, watch BP, HR & O2, trach now capped & on room air (9/21) Required Braces or Orthoses: Other Brace Other Brace: aircast on LLE during gait Restrictions Weight Bearing Restrictions: No Pain:   Therapy/Group: Individual Therapy  Tenesia Escudero 07/13/2019, 9:39 AM

## 2019-07-13 NOTE — Progress Notes (Signed)
[facility] PHYSICAL MEDICINE & REHABILITATION PROGRESS NOTE   Subjective/Complaints: Pt with occasional secretions, able to cough up. No problems tolerating decannulation. Denies any air leakage thru stoma  ROS: Patient denies fever, rash, sore throat, blurred vision, nausea, vomiting, diarrhea, cough, shortness of breath or chest pain, joint or back pain, headache, or mood change.   Objective:   No results found. No results for input(s): WBC, HGB, HCT, PLT in the last 72 hours. No results for input(s): NA, K, CL, CO2, GLUCOSE, BUN, CREATININE, CALCIUM in the last 72 hours.  Intake/Output Summary (Last 24 hours) at 07/13/2019 0915 Last data filed at 07/13/2019 0245 Gross per 24 hour  Intake 474 ml  Output 2250 ml  Net -1776 ml     Physical Exam: Vital Signs Blood pressure 132/73, pulse 98, temperature 98.1 F (36.7 C), temperature source Oral, resp. rate (!) 22, height 6\' 1"  (1.854 m), weight (!) 141.2 kg, SpO2 95 %. Constitutional: No distress . Vital signs reviewed. HEENT: EOMI, oral membranes moist Neck: supple Cardiovascular: RRR without murmur. No JVD    Respiratory: CTA Bilaterally without wheezes or rales. Normal effort    GI: BS +, non-tender, non-distended, PEG site clean Skin: Warm and dry.  Intact. Psych: Normal mood.  Normal behavior. Musc: No edema in extremities.  No tenderness in extremities. Neurological: Alert and follows all commands.  Motor: 4+/5 throughout, unchanged  Assessment/Plan: 1. 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  Physiatrist is providing close team supervision and 24 hour management of active medical problems listed below.  Physiatrist and rehab team continue to assess barriers to discharge/monitor patient progress toward functional and medical goals  Care Tool:  Bathing    Body parts bathed by patient: Right arm, Left  arm, Abdomen, Chest, Right upper leg, Left upper leg, Face, Front perineal area, Buttocks, Right lower leg, Left lower leg   Body parts bathed by helper: Buttocks     Bathing assist Assist Level: Contact Guard/Touching assist     Upper Body Dressing/Undressing Upper body dressing   What is the patient wearing?: Pull over shirt    Upper body assist Assist Level: Set up assist    Lower Body Dressing/Undressing Lower body dressing      What is the patient wearing?: Pants, Underwear/pull up     Lower body assist Assist for lower body dressing: Contact Guard/Touching assist     Toileting Toileting    Toileting assist Assist for toileting: Minimal Assistance - Patient > 75%     Transfers Chair/bed transfer  Transfers assist  Chair/bed transfer activity did not occur: Safety/medical concerns  Chair/bed transfer assist level: Contact Guard/Touching assist     Locomotion Ambulation   Ambulation assist   Ambulation activity did not occur: Safety/medical concerns  Assist level: Minimal Assistance - Patient > 75% Assistive device: Walker-rolling(Anterior Support Hybrid Spiral AFO) Max distance: 195ft   Walk 10 feet activity   Assist  Walk 10 feet activity did not occur: Safety/medical concerns  Assist level: Minimal Assistance - Patient > 75% Assistive device: Walker-rolling, Orthosis   Walk 50 feet activity   Assist Walk 50 feet with 2 turns activity did not occur: Safety/medical concerns  Assist level: Minimal Assistance - Patient > 75% Assistive device: Walker-rolling, Orthosis    Walk 150 feet activity   Assist Walk 150 feet activity did not occur: Safety/medical concerns  Assist level: Minimal Assistance - Patient >  75% Assistive device: Walker-rolling    Walk 10 feet on uneven surface  activity   Assist Walk 10 feet on uneven surfaces activity did not occur: Safety/medical concerns   Assist level: Minimal Assistance - Patient >  75% Assistive device: Walker-rolling, Orthosis   Wheelchair     Assist Will patient use wheelchair at discharge?: (TBD) Type of Wheelchair: Manual    Wheelchair assist level: Supervision/Verbal cueing Max wheelchair distance: 2ft    Wheelchair 50 feet with 2 turns activity    Assist    Wheelchair 50 feet with 2 turns activity did not occur: Safety/medical concerns   Assist Level: Supervision/Verbal cueing   Wheelchair 150 feet activity     Assist      Assist Level: Supervision/Verbal cueing   Blood pressure 132/73, pulse 98, temperature 98.1 F (36.7 C), temperature source Oral, resp. rate (!) 22, height 6\' 1"  (1.854 m), weight (!) 141.2 kg, SpO2 95 %.  Medical Problem List and Plan: 1. Reduced Mobility, swllow function and self care secondary to left lateral medullary infarct, Wallenberg type syndrome, course complicated by multiple medical issues and prolonged hospital stay  Continue CIR therapies  -family ed, dc Saturday 2. Antithrombotics: -DVT/anticoagulation:Pharmaceutical:Lovenox -antiplatelet therapy: ASA.   Resumed Plavix 3. Pain Management:n/A 4. Mood/delirium:  -antipsychotic agents:       Continue HS seroquel at 2100     Continue current dose  -celexa added for anxiety  5. Neuropsych: This patientis ?not fullycapable of making decisions onhisown behalf. 6. Skin/Wound Care:Routine pressure relief measures. 7. Fluids/Electrolytes/Nutrition:  BMP within acceptable range on 9/21 8. T2DM: Hgb A1c-10.6. Now on lantus with SSI for elevated BS. Continue to monitor BS every 4 hours.   continue  lantus  33u CBG (last 3)  Recent Labs    07/12/19 1624 07/12/19 2130 07/13/19 0613  GLUCAP 158* 193* 105*   Improving control. 9/24 9. Recurrent PNA--H flu on 9/7: Has been on prolonged antibiotics--narrowed to rocephin on 9/8-9/12  -afebrile currently  -9/10 CXR without acute disease. 10. Respiratory failure s/p  trach/OSA:     - TID robinul to help manage secretions.     -successful decannulation, stoma closing   -encouraged flutter valve, IS, OOB, etc  -CPAP 11. Tachycardia:   Resolved  12. Severe dysphagia: severe pharyngeal dysphagia  - PEG placed by IR on 9/15  -tolerating bolus feeds  -  MBS today 13. Brief unresponsive episodes: Resolved  -likely vasovagal events related to trach suctioning  -EEG performed and did not demonstrate seizure activity  -Continue to monitor  14.  Morbid obesity  Continue to encourage weight loss 15.  Acute blood loss anemia  Hemoglobin 11.7 9/21  LOS: 16 days A FACE TO FACE EVALUATION WAS PERFORMED  10/21 07/13/2019, 9:15 AM

## 2019-07-13 NOTE — Progress Notes (Signed)
Patient using home cpap, will continue to monitor patient.

## 2019-07-13 NOTE — Progress Notes (Signed)
Modified Barium Swallow Progress Note  Patient Details  Name: Michael Bright MRN: [id number] Date of Birth: 07-21-1956  Today's Date: 07/13/2019  Modified Barium Swallow completed.  Full report located under Chart Review in the Imaging Section.  Brief recommendations include the following:  Clinical Impression  Patient demonstrates minimal change in swallowing function since initial MBS on 06/27/19. Patient continues to demonstrate a severe pharyngeal swallow and demonstrated frank aspiration with ice chips. Severely reduced mobility of patient's pharyngeal musculature with reduced UES relaxation and opening led to accumulation of thin liquids above the UES resulting in liquids spilling over into the airway posteriorly.  Recommend patient remain NPO with PEG. Patient's progress will likely be slow and prolonged and patient would benefit from continued intensive swallowing therapy. Patient educated on results and verbalized understanding.   Swallow Evaluation Recommendations       SLP Diet Recommendations: Alternative means - long-term;NPO       Medication Administration: Via alternative means               Oral Care Recommendations: Oral care QID   Other Recommendations: Have oral suction available    Aceyn Kathol 07/13/2019,10:43 AM

## 2019-07-13 NOTE — Progress Notes (Signed)
Physical Therapy Session Note  Patient Details  Name: Michael Bright MRN: [id number] Date of Birth: 1956-01-15  Today's Date: 07/13/2019 PT Individual Time: 1100-1200 PT Individual Time Calculation (min): 60 min   Short Term Goals: Week 1:  PT Short Term Goal 1 (Week 1): Pt will ambulate 50 ft with LRAD & CGA. PT Short Term Goal 1 - Progress (Week 1): Progressing toward goal PT Short Term Goal 2 (Week 1): Pt will complete bed mobility without hospital bed features with CGA. PT Short Term Goal 2 - Progress (Week 1): Met PT Short Term Goal 3 (Week 1): Pt will complete all transfers with CGA & LRAD. PT Short Term Goal 3 - Progress (Week 1): Progressing toward goal PT Short Term Goal 4 (Week 1): Pt will negotiate 4 steps with B rails & min assist for strengthening. PT Short Term Goal 4 - Progress (Week 1): Progressing toward goal Week 2:  PT Short Term Goal 1 (Week 2): Pt will be able to perform functional transfers with CGA PT Short Term Goal 2 (Week 2): Pt will be able to gait x 100' with CGA PT Short Term Goal 3 (Week 2): Pt will be able to perform 4 stairs with rails with min assist for functional strengthening Week 3:     Skilled Therapeutic Interventions/Progress Updates:    Received pt sitting upright in recliner. Pt agreeable to PT. Pt coughing constantly throughout session, but unable to produce strong enough cough to clear any secretions. Pt c/o abdominal discomfort and air in his stomach. Today's session focused on balance/coordination, gait, stair navigation, LE strength, safety education, and improved activity tolerance in preparation for D/C. PT donned shoes and LLE AFO max assist for time management purposes. Pt navigated 8 steps min A using bilateral handrails. Pt required mod/max cueing for foot sequence/placement. Teach back method was used throughout session to remind patient to "go up with the good and down with the bad" for safety purposes. O2 sat 94% and HR 94bpm after  stairs. Pt ambulated 151f x1 trial and 1076fx 1 trial with the RW min A. Verbal cues were given for slow pace and to widen stance. O2 91% and HR 114bpm after ambulation, but returned to 95% after 1 minute seated rest break. Pt performed bilateral static tandem balance inside parallel bars x 2 trials with min A for balance and verbal cues for weight shifting. Pt performed forward/backwards tandem walking inside parallel bars x 2 trials with min A. O2 sat 94% and HR 120. Pt performed bilateral standing hip abd x10 reps with UE support on railing. Pt reported 9/10 fatigue at the end of the session, but stated that it was "a good tired" and "this is what I need". Pt tolerated today's session well. Concluded session with pt sitting in WC, all needs within reach, chair pad alarmed.   Therapy Documentation Precautions:  Precautions Precautions: Fall, Other (comment) Precaution Comments: PEG, watch BP, HR & O2, trach now capped & on room air (9/21) Required Braces or Orthoses: Other Brace Other Brace: aircast on LLE during gait Restrictions Weight Bearing Restrictions: No  Pain:    Pt denied any pain during today's session.    Therapy/Group: Individual Therapy  [email or web address], DPT   07/13/2019, 12:12 PM

## 2019-07-13 NOTE — Plan of Care (Signed)
  Problem: RH BLADDER ELIMINATION Goal: RH STG MANAGE BLADDER WITH ASSISTANCE Description: STG Manage Bladder With min Assistance Outcome: Not Progressing; in and out cath   Problem: RH BOWEL ELIMINATION Goal: RH STG MANAGE BOWEL WITH ASSISTANCE Description: STG Manage Bowel with  mod I Assistance. Outcome: Not Progressing; constipation

## 2019-07-13 NOTE — Progress Notes (Signed)
Occupational Therapy Session Note  Patient Details  Name: Michael Bright MRN: [id number] Date of Birth: June 23, 1956  Today's Date: 07/13/2019 OT Individual Time: 6767-2094 OT Individual Time Calculation (min): 72 min    Short Term Goals: Week 1:  OT Short Term Goal 1 (Week 1): Pt will complete 1 grooming task in standing with HR > 120 bpm OT Short Term Goal 1 - Progress (Week 1): Met OT Short Term Goal 2 (Week 1): Pt will don shorts with min A OT Short Term Goal 2 - Progress (Week 1): Met OT Short Term Goal 3 (Week 1): Pt will complete toileting tasks with min A OT Short Term Goal 3 - Progress (Week 1): Met OT Short Term Goal 4 (Week 1): Pt will demonstrate improved safety awareness, requiring no more than min cueing during ADLs OT Short Term Goal 4 - Progress (Week 1): Met  Skilled Therapeutic Interventions/Progress Updates:    1;1. Pt received in w/c agreeable to OT with no pain reported during session. Pt requesting to work on functional sit to stand as this is biggest barrier in pt opinion. Pt completes sit to stand ever 3 bean bags (15 total sit to stand) with S-CGA overall with VC for scooting to EOC and foot placement. tp with improved hand placement recall for safety. Pt stands to complete card matching activity with CGA fading to S with no UE support on RW for dynamic reaching in min-mod ranges outside BOS. Pt completes standing ball toss with CGA (bounce and chest pass) 2x20 with 1# wrist erights for dynamic standing balance and UE strenghening required for BADLs. 1LOB posteriorly requiring MIN A to recover. pt completes ambulatory search for items in ADL apartment reaching from waist to above head height for items and placing in  Walker bag. Exited session with pt seated in w/c, call light in reach and and exit alarm on  Therapy Documentation Precautions:  Precautions Precautions: Fall, Other (comment) Precaution Comments: PEG, watch BP, HR & O2, trach now capped & on room air  (9/21) Required Braces or Orthoses: Other Brace Other Brace: aircast on LLE during gait Restrictions Weight Bearing Restrictions: No General:   Vital Signs:  Pain: Pain Assessment Pain Scale: 0-10 Pain Score: 10-Worst pain ever Pain Type: Acute pain Pain Location: Shoulder Pain Orientation: Left Pain Descriptors / Indicators: Aching Pain Frequency: Intermittent Pain Onset: Progressive Pain Intervention(s): Medication (See eMAR) ADL: ADL Eating: NPO Grooming: Setup Where Assessed-Grooming: Sitting at sink Upper Body Bathing: Supervision/safety Where Assessed-Upper Body Bathing: Edge of bed Lower Body Bathing: Moderate assistance Where Assessed-Lower Body Bathing: Edge of bed Upper Body Dressing: Minimal assistance Where Assessed-Upper Body Dressing: Edge of bed Lower Body Dressing: Moderate assistance Where Assessed-Lower Body Dressing: Edge of bed Toileting: Moderate assistance Where Assessed-Toileting: Bedside Commode, Toilet Toilet Transfer: Moderate assistance Toilet Transfer Method: Counselling psychologist: Bedside commode, Grab bars Vision   Perception    Praxis   Exercises:   Other Treatments:     Therapy/Group: Individual Therapy  Tonny Branch 07/13/2019, 1:36 PM

## 2019-07-14 ENCOUNTER — Encounter (HOSPITAL_COMMUNITY): Payer: Medicare Other | Admitting: Speech Pathology

## 2019-07-14 ENCOUNTER — Inpatient Hospital Stay (HOSPITAL_COMMUNITY): Payer: BC Managed Care – PPO

## 2019-07-14 ENCOUNTER — Encounter (HOSPITAL_COMMUNITY): Payer: Medicare Other

## 2019-07-14 ENCOUNTER — Ambulatory Visit (HOSPITAL_COMMUNITY): Payer: Medicare Other | Admitting: Physical Therapy

## 2019-07-14 DIAGNOSIS — J40 Bronchitis, not specified as acute or chronic: Secondary | ICD-10-CM

## 2019-07-14 LAB — GLUCOSE, CAPILLARY
Glucose-Capillary: 81 mg/dL (ref 70–99)
Glucose-Capillary: 135 mg/dL — ABNORMAL HIGH (ref 70–99)
Glucose-Capillary: 157 mg/dL — ABNORMAL HIGH (ref 70–99)
Glucose-Capillary: 164 mg/dL — ABNORMAL HIGH (ref 70–99)

## 2019-07-14 MED ORDER — BETHANECHOL CHLORIDE 25 MG PO TABS
25.0000 mg | ORAL_TABLET | Freq: Four times a day (QID) | ORAL | Status: DC
  Administered 2019-07-14 – 2019-07-17 (×12): 25 mg
  Filled 2019-07-14 (×13): qty 1

## 2019-07-14 MED ORDER — GUAIFENESIN-DM 100-10 MG/5ML PO SYRP
15.0000 mL | ORAL_SOLUTION | Freq: Four times a day (QID) | ORAL | Status: DC
  Administered 2019-07-14 – 2019-07-15 (×7): 15 mL via ORAL
  Filled 2019-07-14 (×7): qty 15

## 2019-07-14 MED ORDER — QUETIAPINE FUMARATE 50 MG PO TABS
100.0000 mg | ORAL_TABLET | Freq: Every day | ORAL | Status: DC
  Administered 2019-07-14 – 2019-07-16 (×3): 100 mg
  Filled 2019-07-14 (×3): qty 2

## 2019-07-14 NOTE — Progress Notes (Signed)
Physical Therapy Weekly Progress Note  Patient Details  Name: Michael Bright MRN: [id number] Date of Birth: 1956/05/06  Beginning of progress report period: July 06, 2019 End of progress report period: July 14, 2019  Today's Date: 07/14/2019 PT Individual Time: 1100-1158 PT Individual Time Calculation (min): 58 min   Patient has met 1 of 3 short term goals.  Pt is making fair progress with functional mobility. Pt continues to require min assist overall for mobility 2/2 decreased endurance, impaired balance, and decreased strength. AFO consult was completed & pt now has his personal anterior support hybrid spiral AFO. Family ed as been completed with recommendation for hands on assist at all times at home.    Patient continues to demonstrate the following deficits muscle weakness, decreased cardiorespiratoy endurance, decreased coordination, decreased memory, and decreased standing balance, decreased postural control, decreased balance strategies and mild L hemiparesis and therefore will continue to benefit from skilled PT intervention to increase functional independence with mobility.  Patient not progressing toward long term goals.  See goal revision..  Plan of care revisions: goals downgraded to CGA/min assist overall 2/2 slow progress & ongoing impaired balance.  PT Short Term Goals Week 2:  PT Short Term Goal 1 (Week 2): Pt will be able to perform functional transfers with CGA PT Short Term Goal 1 - Progress (Week 2): Not met PT Short Term Goal 2 (Week 2): Pt will be able to gait x 100' with CGA PT Short Term Goal 2 - Progress (Week 2): Not met PT Short Term Goal 3 (Week 2): Pt will be able to perform 4 stairs with rails with min assist for functional strengthening PT Short Term Goal 3 - Progress (Week 2): Met Week 3:  PT Short Term Goal 1 (Week 3): STG = LTG due to ELOS.  Skilled Therapeutic Interventions/Progress Updates:  Pt received in bed with family (wife & son) present  for caregiver training. Session focused on caregiver training in regards to bed mobility, transfers (sit>stand & car at elevated height), gait over even surfaces & ramp, and stair negotiation. Educated pt/family on need and use of L AFO for standing/gait & educated them on use of gait belt at home and need for caregivers to stand on L (weak) side when assisting pt with functional mobility. Therapist provides demonstration for bed mobility, gait over even surface & ramp, car transfer & stair negotiation. Family return demonstrates assisting pt with gait over even surface & ramp & car transfer and voice comfort with assisting pt with all mobility tasks. Pt requires ongoing cuing for compensatory pattern when negotiating steps (4 steps, 6") with B rails & min assist. At end of session pt left in bed with alarm set, family present.  Pain: no c/o pain during session.   Therapy Documentation Precautions:  Precautions Precautions: Fall, Other (comment) Precaution Comments: PEG, watch BP, HR & O2, Required Braces or Orthoses: Other Brace Other Brace: L AFO Restrictions Weight Bearing Restrictions: No   Therapy/Group: Individual Therapy  Waunita Schooner 07/14/2019, 12:41 PM

## 2019-07-14 NOTE — Progress Notes (Signed)
Social Work Patient ID: Michael Bright, male   DOB: 05-01-1956, 63 y.o.   MRN: [id number] Wife and son are here for family training and it has gone well. Aware tube feedings to be delivered to the home and she will get a call. Other equipment delivered to his room. MD feels with medical issues needs to delay discharge until Monday. Awaiting a chest x-ray and not completely voiding. All in agreement with this plan. Lucy-Sw to return Monday can confirm discharge.

## 2019-07-14 NOTE — Progress Notes (Signed)
Occupational Therapy Session Note  Patient Details  Name: Michael Bright MRN: [id number] Date of Birth: September 19, 1956  Today's Date: 07/14/2019 OT Individual Time: 0900-1000 OT Individual Time Calculation (min): 60 min    Short Term Goals: Week 2:  OT Short Term Goal 1 (Week 2): Pt will don pants with CGA OT Short Term Goal 1 - Progress (Week 2): Met OT Short Term Goal 2 (Week 2): Pt will require no more than 2 rest breaks in 30 min to manage secretions without suction OT Short Term Goal 2 - Progress (Week 2): Met OT Short Term Goal 3 (Week 2): Pt will complete standing level grooming task with all VSS for > 3 minutes OT Short Term Goal 3 - Progress (Week 2): Met  Skilled Therapeutic Interventions/Progress Updates:    1:1. Pt received in bed with family present. OT educates on AE, bathroom transfers, w/c set up for bathroom transfers, hand placement, RW management, foot placement, scooting to EOC, providing CGA and AFO/shoe button adaptations. Pt wife and son present and complete hands on bathroom transfers with no VC from OT for safety awareness. Pt able to self monitor bathroom positioning and safety aspects of transfer and direct care. Pt and family given energy conservation handout and educated on adaptations for ADLs/IADLs. Pt continues to shift between CGA and S level of ADL performance therefore OT encourages CGA intially at home. Exited session with pt seated in bed, family in room, all questions answered to fullest extent and call light in reach.  Therapy Documentation Precautions:  Precautions Precautions: Fall, Other (comment) Precaution Comments: PEG, watch BP, HR & O2, Required Braces or Orthoses: Other Brace Other Brace: aircast on LLE during gait Restrictions Weight Bearing Restrictions: No General:   Vital Signs: Therapy Vitals Pulse Rate: 98 Resp: 16 BP: 126/71 Patient Position (if appropriate): Lying Oxygen Therapy SpO2: 95 % O2 Device: Room Air Pain: Pain  Assessment Pain Scale: 0-10 Pain Score: 5  Pain Type: Acute pain Pain Location: Shoulder Pain Orientation: Left Pain Descriptors / Indicators: Aching Pain Frequency: Intermittent Pain Onset: Gradual Pain Intervention(s): Medication (See eMAR) ADL: ADL Eating: NPO Grooming: Setup Where Assessed-Grooming: Sitting at sink Upper Body Bathing: Supervision/safety Where Assessed-Upper Body Bathing: Edge of bed Lower Body Bathing: Moderate assistance Where Assessed-Lower Body Bathing: Edge of bed Upper Body Dressing: Minimal assistance Where Assessed-Upper Body Dressing: Edge of bed Lower Body Dressing: Moderate assistance Where Assessed-Lower Body Dressing: Edge of bed Toileting: Moderate assistance Where Assessed-Toileting: Bedside Commode, Toilet Toilet Transfer: Moderate assistance Toilet Transfer Method: Counselling psychologist: Bedside commode, Grab bars Vision Baseline Vision/History: Wears glasses Wears Glasses: Distance only Patient Visual Report: No change from baseline Perception  Perception: Within Functional Limits Praxis Praxis: Intact Exercises:   Other Treatments:     Therapy/Group: Individual Therapy  Tonny Branch 07/14/2019, 10:00 AM

## 2019-07-14 NOTE — Progress Notes (Signed)
[facility] PHYSICAL MEDICINE & REHABILITATION PROGRESS NOTE   Subjective/Complaints: Pt with more secretions/productive cough over last 24 hours. He's trying to cough up. Family in for education  ROS: Patient denies fever, rash, sore throat, blurred vision, nausea, vomiting, diarrhea, cough, shortness of breath or chest pain, joint or back pain, headache, or mood change.    Objective:   Dg Swallowing Func-speech Pathology  Result Date: 07/13/2019 Objective Swallowing Evaluation: Type of Study: MBS-Modified Barium Swallow Study  Patient Details Name: Michael Bright MRN: [id number] Date of Birth: 1956-01-12 Today's Date: 07/13/2019 Time: SLP Start Time (ACUTE ONLY): 0910 -SLP Stop Time (ACUTE ONLY): 0935 SLP Time Calculation (min) (ACUTE ONLY): 25 min Past Medical History: Past Medical History: Diagnosis Date . Diabetes [facility])  Past Surgical History: Past Surgical History: Procedure Laterality Date . CHOLECYSTECTOMY   . IR GASTROSTOMY TUBE MOD SED  07/04/2019 HPI: See H&P  Subjective: alert, cooperative, motivated Assessment / Plan / Recommendation CHL IP CLINICAL IMPRESSIONS 07/13/2019 Clinical Impression Patient demonstrates minimal change in swallowing function since initial MBS on 06/27/19. Patient continues to demonstrate a severe pharyngeal swallow and demonstrated frank aspiration with ice chips. Severely reduced mobility of patient's pharyngeal musculature with reduced UES relaxation and opening led to accumulation of thin liquids above the UES resulting in liquids spilling over into the airway posteriorly.  Recommend patient remain NPO with PEG. Patient's progress will likely be slow and prolonged and patient would benefit from continued intensive swallowing therapy. Patient educated on results and verbalized understanding. SLP Visit Diagnosis Dysphagia, pharyngeal phase (R13.13) Attention and concentration deficit following -- Frontal lobe and executive function deficit following -- Impact on safety  and function Severe aspiration risk   CHL IP TREATMENT RECOMMENDATION 07/13/2019 Treatment Recommendations Therapy as outlined in treatment plan below   Prognosis 07/13/2019 Prognosis for Safe Diet Advancement Fair Barriers to Reach Goals Severity of deficits Barriers/Prognosis Comment -- CHL IP DIET RECOMMENDATION 07/13/2019 SLP Diet Recommendations Alternative means - long-term;NPO Liquid Administration via -- Medication Administration Via alternative means Compensations -- Postural Changes --   CHL IP OTHER RECOMMENDATIONS 07/13/2019 Recommended Consults -- Oral Care Recommendations Oral care QID Other Recommendations Have oral suction available   CHL IP FOLLOW UP RECOMMENDATIONS 07/13/2019 Follow up Recommendations Outpatient SLP;Home health SLP   CHL IP FREQUENCY AND DURATION 07/13/2019 Speech Therapy Frequency (ACUTE ONLY) min 5x/week Treatment Duration 1 week      CHL IP ORAL PHASE 07/13/2019 Oral Phase WFL Oral - Pudding Teaspoon -- Oral - Pudding Cup -- Oral - Honey Teaspoon -- Oral - Honey Cup -- Oral - Nectar Teaspoon -- Oral - Nectar Cup -- Oral - Nectar Straw -- Oral - Thin Teaspoon -- Oral - Thin Cup -- Oral - Thin Straw -- Oral - Puree -- Oral - Mech Soft -- Oral - Regular -- Oral - Multi-Consistency -- Oral - Pill -- Oral Phase - Comment --  CHL IP PHARYNGEAL PHASE 07/13/2019 Pharyngeal Phase Impaired Pharyngeal- Pudding Teaspoon -- Pharyngeal -- Pharyngeal- Pudding Cup -- Pharyngeal -- Pharyngeal- Honey Teaspoon -- Pharyngeal -- Pharyngeal- Honey Cup -- Pharyngeal -- Pharyngeal- Nectar Teaspoon -- Pharyngeal -- Pharyngeal- Nectar Cup -- Pharyngeal -- Pharyngeal- Nectar Straw -- Pharyngeal -- Pharyngeal- Thin Teaspoon (Ice chips) Reduced epiglottic inversion;Reduced anterior laryngeal mobility;Reduced laryngeal elevation;Reduced airway/laryngeal closure;Reduced tongue base retraction;Penetration/Aspiration during swallow;Significant aspiration (Amount);Pharyngeal residue - pyriform Pharyngeal Material enters  airway, passes BELOW cords and not ejected out despite cough attempt by patient Pharyngeal- Thin Cup -- Pharyngeal -- Pharyngeal- Thin Straw NT Pharyngeal --  Pharyngeal- Puree -- Pharyngeal -- Pharyngeal- Mechanical Soft -- Pharyngeal -- Pharyngeal- Regular -- Pharyngeal -- Pharyngeal- Multi-consistency -- Pharyngeal -- Pharyngeal- Pill -- Pharyngeal -- Pharyngeal Comment --  No flowsheet data found. PAYNE, COURTNEY 07/13/2019, 10:44 AM Feliberto Gottron, MA, CCC-SLP [phone]              No results for input(s): WBC, HGB, HCT, PLT in the last 72 hours. No results for input(s): NA, K, CL, CO2, GLUCOSE, BUN, CREATININE, CALCIUM in the last 72 hours.  Intake/Output Summary (Last 24 hours) at 07/14/2019 1047 Last data filed at 07/14/2019 0425 Gross per 24 hour  Intake -  Output 2500 ml  Net -2500 ml     Physical Exam: Vital Signs Blood pressure 126/71, pulse 98, temperature 98 F (36.7 C), temperature source Oral, resp. rate 16, height 6\' 1"  (1.854 m), weight (!) 141.4 kg, SpO2 95 %. Constitutional: No distress . Vital signs reviewed. HEENT: EOMI, oral membranes moist Neck: supple, stoma closed Cardiovascular: RRR without murmur. No JVD  Respiratory: no definite wheezes or rales. A few rhonchi.  Normal effort. Thick tan, secretions in suction container  GI: BS +, non-tender, non-distended, PEG site clean Skin: Warm and dry.  Intact. Psych: pleasant and cooperative Musc: No edema in extremities.  No tenderness in extremities. Neurological: Alert and follows all commands.  Motor: 4+/5 throughout, stable  Assessment/Plan: 1. 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  Physiatrist is providing close team supervision and 24 hour management of active medical problems listed below.  Physiatrist and rehab team continue to assess barriers to discharge/monitor patient progress toward  functional and medical goals  Care Tool:  Bathing    Body parts bathed by patient: Right arm, Left arm, Abdomen, Chest, Right upper leg, Left upper leg, Face, Front perineal area, Buttocks, Right lower leg, Left lower leg   Body parts bathed by helper: Buttocks     Bathing assist Assist Level: Contact Guard/Touching assist     Upper Body Dressing/Undressing Upper body dressing   What is the patient wearing?: Pull over shirt    Upper body assist Assist Level: Set up assist    Lower Body Dressing/Undressing Lower body dressing      What is the patient wearing?: Pants, Underwear/pull up     Lower body assist Assist for lower body dressing: Contact Guard/Touching assist     Toileting Toileting    Toileting assist Assist for toileting: Minimal Assistance - Patient > 75%     Transfers Chair/bed transfer  Transfers assist  Chair/bed transfer activity did not occur: Safety/medical concerns  Chair/bed transfer assist level: Minimal Assistance - Patient > 75%     Locomotion Ambulation   Ambulation assist   Ambulation activity did not occur: Safety/medical concerns  Assist level: Minimal Assistance - Patient > 75% Assistive device: Walker-rolling Max distance: 15ft   Walk 10 feet activity   Assist  Walk 10 feet activity did not occur: Safety/medical concerns  Assist level: Minimal Assistance - Patient > 75% Assistive device: Walker-rolling, Orthosis   Walk 50 feet activity   Assist Walk 50 feet with 2 turns activity did not occur: Safety/medical concerns  Assist level: Minimal Assistance - Patient > 75% Assistive device: Walker-rolling, Orthosis    Walk 150 feet activity   Assist Walk 150 feet activity did not occur: Safety/medical concerns  Assist level: Minimal Assistance - Patient > 75% Assistive device: Walker-rolling, Orthosis  Walk 10 feet on uneven surface  activity   Assist Walk 10 feet on uneven surfaces activity did not occur:  Safety/medical concerns   Assist level: Minimal Assistance - Patient > 75% Assistive device: Walker-rolling, Orthosis   Wheelchair     Assist Will patient use wheelchair at discharge?: (TBD) Type of Wheelchair: Manual    Wheelchair assist level: Supervision/Verbal cueing Max wheelchair distance: 3450ft    Wheelchair 50 feet with 2 turns activity    Assist    Wheelchair 50 feet with 2 turns activity did not occur: Safety/medical concerns   Assist Level: Supervision/Verbal cueing   Wheelchair 150 feet activity     Assist      Assist Level: Supervision/Verbal cueing   Blood pressure 126/71, pulse 98, temperature 98 F (36.7 C), temperature source Oral, resp. rate 16, height 6\' 1"  (1.854 m), weight (!) 141.4 kg, SpO2 95 %.  Medical Problem List and Plan: 1. Reduced Mobility, swllow function and self care secondary to left lateral medullary infarct, Wallenberg type syndrome, course complicated by multiple medical issues and prolonged hospital stay  Continue CIR therapies  -family ed today  -may hold dc pending pulmonary issues. Spoke to wife/family who were present today 2. Antithrombotics: -DVT/anticoagulation:Pharmaceutical:Lovenox -antiplatelet therapy: ASA.   Resumed Plavix 3. Pain Management:n/A 4. Mood/delirium: much improved -antipsychotic agents:       Continue HS seroquel at 2100     Continue current dose  -celexa added for anxiety  5. Neuropsych: This patientis ?not fullycapable of making decisions onhisown behalf. 6. Skin/Wound Care:Routine pressure relief measures. 7. Fluids/Electrolytes/Nutrition:  BMP within acceptable range on 9/21 8. T2DM: Hgb A1c-10.6. Now on lantus with SSI for elevated BS. Continue to monitor BS every 4 hours.   continue  lantus  33u CBG (last 3)  Recent Labs    07/13/19 2053 07/13/19 2127 07/14/19 0615  GLUCAP 136* 118* 81   Improving control. 9/25 9. Recurrent PNA--H flu on  9/7: Has been on prolonged antibiotics--narrowed to rocephin on 9/8-9/12  -afebrile currently  -9/10 CXR without acute disease. 10. Respiratory failure s/p trach/OSA:     -increased cough over last 36 hours  - TID robinul to help manage secretions.    -successful decannulation, stoma closed   -proactive flutter valve, IS, OOB, etc  -CPAP  -check cxr today--will review when available  -add robitussin dm thru peg 11. Tachycardia:   Resolved  12. Severe dysphagia: severe pharyngeal dysphagia  - PEG placed by IR on 9/15  -tolerating bolus feeds  -  MBS without improvement 13. Brief unresponsive episodes: Resolved  -likely vasovagal events related to trach suctioning  -EEG performed and did not demonstrate seizure activity  -Continue to monitor  14.  Morbid obesity  Continue to encourage weight loss 15.  Acute blood loss anemia  Hemoglobin 11.7 9/21 16. Urine retention:  -usually at night  -needs to be OOB to void at all times  -increase urecholine to 25mg  qid   Greater than 35 total minutes was spent in examination of patient, assessment of pertinent data,  formulation of a treatment plan, and in discussion with patient and/or family.     LOS: 17 days A FACE TO FACE EVALUATION WAS PERFORMED  Ranelle OysterZachary T Swartz 07/14/2019, 10:47 AM

## 2019-07-14 NOTE — Progress Notes (Signed)
Speech Language Pathology Discharge Summary  Patient Details  Name: Michael Bright MRN: [id number] Date of Birth: 09/04/1956  Patient has met 6 of 6 long term goals.  Patient to discharge at overall Supervision level.   Reasons goals not met: N/A   Clinical Impression/Discharge Summary: Patient has made functional gains and has met 6 of 6 LTGs this admission. Currently, patient continues to demonstrate a severe pharyngeal dysphagia with frank aspiration of thin liquids, therefore, patient remains NPO with PEG in place for nutritional support. Patient demonstrates improved cognitive functioning and requires supervision verbal cues for recall of functional information.,emergent awareness of errors, and complex problem solving. Patient and family education complete and patient will discharge home with assistance from family. Patient would benefit from f/u SLP services to maximize his swallowing and cognitive functioning.   Care Partner:  Caregiver Able to Provide Assistance: Yes  Type of Caregiver Assistance: Physical;Cognitive  Recommendation:  Home Health SLP;24 hour supervision/assistance  Rationale for SLP Follow Up: Maximize cognitive function and independence;Maximize swallowing safety;Reduce caregiver burden   Equipment: Suction   Reasons for discharge: Treatment goals met;Discharged from hospital   Patient/Family Agrees with Progress Made and Goals Achieved: Yes    [place], [place] 07/14/2019, 6:42 AM

## 2019-07-14 NOTE — Progress Notes (Signed)
Occupational Therapy Discharge Summary  Patient Details  Name: Michael Bright MRN: [id number] Date of Birth: [birth year]   Patient has met 40 of 10 long term goals due to improved activity tolerance, improved balance, postural control, ability to compensate for deficits, functional use of  LEFT upper and LEFT lower extremity, improved attention, improved awareness and improved coordination.  Patient to discharge at [facility] Supervision- [place] level.  Patient's care partner is independent and has completed family educaiton to provide the necessary physical and cognitive assistance at discharge.    Reasons goals not met: n/a  Recommendation:  Patient will benefit from ongoing skilled OT services in home health setting to continue to advance functional skills in the area of BADL.  Equipment: No equipment provided  Reasons for discharge: treatment goals met and discharge from hospital  Patient/family agrees with progress made and goals achieved: Yes  OT Discharge Precautions/Restrictions  Precautions Precautions: Fall;Other (comment) Precaution Comments: PEG, watch BP, HR & O2, Required Braces or Orthoses: Other Brace Other Brace: aircast on LLE during gait Restrictions Weight Bearing Restrictions: No Pain Pain Assessment Pain Score: 0-No pain Vision Baseline Vision/History: Wears glasses Wears Glasses: Distance only Patient Visual Report: No change from baseline Perception  Perception: Within Functional Limits Praxis Praxis: Intact Cognition Overall Cognitive Status: Impaired/Different from baseline Arousal/Alertness: Awake/alert Orientation Level: Oriented X4 Attention: Selective Sustained Attention: Appears intact Selective Attention: Appears intact Memory: Impaired Sensation Sensation Light Touch: Appears Intact Proprioception: Appears Intact Coordination Gross Motor Movements are Fluid and Coordinated: No Fine Motor Movements are Fluid and Coordinated:  No Coordination and Movement Description: impaired 2/2 weakness and pain in L shoulder with flexion Heel Shin Test: slightly more impaired LLE Motor  Motor Motor: Abnormal postural alignment and control Mobility  Transfers Sit to Stand: Supervision/Verbal cueing;Contact Guard/Touching assist Stand to Sit: Supervision/Verbal cueing;Contact Guard/Touching assist  Trunk/Postural Assessment  Cervical Assessment Cervical Assessment: (head forward) Thoracic Assessment Thoracic Assessment: (rounded shoulders) Lumbar Assessment Lumbar Assessment: (post pelvic tilt) Postural Control Postural Control: Deficits on evaluation Righting Reactions: delayed Protective Responses: delayed  Balance Dynamic Sitting Balance Dynamic Sitting - Level of Assistance: 5: Stand by assistance Sitting balance - Comments: supervision for sitting balance on EOB Static Standing Balance Static Standing - Level of Assistance: 5: Stand by assistance Dynamic Standing Balance Dynamic Standing - Level of Assistance: 5: Stand by assistance;4: Min assist Extremity/Trunk Assessment RUE Assessment RUE Assessment: Within Functional Limits LUE Assessment General Strength Comments: 4+/5 MMT pain in shoulder limiting movement Brun[place] level for arm: Stage V Relative Independence from Synergy Brunstrum level for hand: Stage VI Isolated joint movements   Tonny Branch 07/14/2019, 6:52 AM

## 2019-07-14 NOTE — Plan of Care (Signed)
Goals downgraded to CGA/min assist overall 2/2 slow progress, impaired balance & strength  Problem: RH Balance Goal: LTG Patient will maintain dynamic standing balance (PT) Description: LTG:  Patient will maintain dynamic standing balance with assistance during mobility activities (PT) Flowsheets (Taken 07/14/2019 1147) LTG: Pt will maintain dynamic standing balance during mobility activities with:: (downgrade 2/2 slow progress, impaired balance & strength) Minimal Assistance - Patient > 75% Note: downgrade 2/2 slow progress, impaired balance & strength   Problem: Sit to Stand Goal: LTG:  Patient will perform sit to stand with assistance level (PT) Description: LTG:  Patient will perform sit to stand with assistance level (PT) Flowsheets (Taken 07/14/2019 1147) LTG: PT will perform sit to stand in preparation for functional mobility with assistance level: (downgrade 2/2 slow progress, impaired balance & strength) Contact Guard/Touching assist Note: downgrade 2/2 slow progress, impaired balance & strength   Problem: RH Bed to Chair Transfers Goal: LTG Patient will perform bed/chair transfers w/assist (PT) Description: LTG: Patient will perform bed to chair transfers with assistance (PT). Flowsheets (Taken 07/14/2019 1147) LTG: Pt will perform Bed to Chair Transfers with assistance level: (downgrade 2/2 slow progress, impaired balance & strength) Minimal Assistance - Patient > 75% Note: downgrade 2/2 slow progress, impaired balance & strength   Problem: RH Car Transfers Goal: LTG Patient will perform car transfers with assist (PT) Description: LTG: Patient will perform car transfers with assistance (PT). Flowsheets (Taken 07/14/2019 1147) LTG: Pt will perform car transfers with assist:: (downgrade 2/2 slow progress, impaired balance & strength) Minimal Assistance - Patient > 75% Note: downgrade 2/2 slow progress, impaired balance & strength   Problem: RH Ambulation Goal: LTG Patient will  ambulate in controlled environment (PT) Description: LTG: Patient will ambulate in a controlled environment, # of feet with assistance (PT). Flowsheets (Taken 07/14/2019 1147) LTG: Pt will ambulate in controlled environ  assist needed:: (downgrade 2/2 slow progress, impaired balance & strength) Minimal Assistance - Patient > 75% LTG: Ambulation distance in controlled environment: 150 ft with LRAD Note: downgrade 2/2 slow progress, impaired balance & strength Goal: LTG Patient will ambulate in home environment (PT) Description: LTG: Patient will ambulate in home environment, # of feet with assistance (PT). Flowsheets (Taken 07/14/2019 1147) LTG: Pt will ambulate in home environ  assist needed:: (downgrade 2/2 slow progress, impaired balance & strength) Minimal Assistance - Patient > 75% LTG: Ambulation distance in home environment: 50 ft with LRAD   Problem: RH Stairs Goal: LTG Patient will ambulate up and down stairs w/assist (PT) Description: LTG: Patient will ambulate up and down # of stairs with assistance (PT) Flowsheets (Taken 07/14/2019 1147) LTG: Pt will ambulate up/down stairs assist needed:: (downgrade 2/2 slow progress, impaired balance & strength) Contact Guard/Touching assist LTG: Pt will  ambulate up and down number of stairs: 4 steps with B rails for strengthening Note: downgrade 2/2 slow progress, impaired balance & strength

## 2019-07-14 NOTE — Progress Notes (Signed)
Speech Language Pathology Daily Session Note  Patient Details  Name: Michael Bright MRN: [id number] Date of Birth: 29-Jan-1956  Today's Date: 07/14/2019 SLP Individual Time: 1000-1055 SLP Individual Time Calculation (min): 55 min  Short Term Goals: Week 3: SLP Short Term Goal 1 (Week 3): STGs=LTGs due to ELOS  Skilled Therapeutic Interventions: Skilled treatment session focused on completion of patient and family education with the patient's wife and son. All were educated on the importance of oral care four times a day as well was how to appropriately perform oral care via the suction toothbrush. All were also educated on risk factors and sypmptoms of aspiration PNA and strategies to reduce risk. Patient demonstrated pharyngeal strengthening exercises for his family along with the rationale with overall supervision verbal cues. Patient's family reported that they did not feel comfortable administering ice chips at this time, therefore, lemon glycerin swabs were provided for utilization at home to maximize use of swallow musculature. All education is complete at this time. Patient left sitting EOB with family present. Continue with current plan of care      Pain No/Denies Pain   Therapy/Group: Individual Therapy  Bentzion Dauria 07/14/2019, 3:10 PM

## 2019-07-15 ENCOUNTER — Inpatient Hospital Stay (HOSPITAL_COMMUNITY): Payer: Medicare Other | Admitting: Speech Pathology

## 2019-07-15 LAB — GLUCOSE, CAPILLARY
Glucose-Capillary: 97 mg/dL (ref 70–99)
Glucose-Capillary: 202 mg/dL — ABNORMAL HIGH (ref 70–99)
Glucose-Capillary: 111 mg/dL — ABNORMAL HIGH (ref 70–99)
Glucose-Capillary: 136 mg/dL — ABNORMAL HIGH (ref 70–99)

## 2019-07-15 MED ORDER — TAMSULOSIN HCL 0.4 MG PO CAPS
0.4000 mg | ORAL_CAPSULE | Freq: Every day | ORAL | Status: DC
  Administered 2019-07-15 – 2019-07-17 (×3): 0.4 mg via ORAL
  Filled 2019-07-15 (×3): qty 1

## 2019-07-15 MED ORDER — GUAIFENESIN-DM 100-10 MG/5ML PO SYRP
15.0000 mL | ORAL_SOLUTION | Freq: Four times a day (QID) | ORAL | Status: DC
  Administered 2019-07-16 – 2019-07-17 (×5): 15 mL
  Filled 2019-07-15 (×5): qty 15

## 2019-07-15 NOTE — Progress Notes (Signed)
Speech Language Pathology Daily Session Note  Patient Details  Name: Michael Bright MRN: [id number] Date of Birth: Oct 02, 1956  Today's Date: 07/15/2019 SLP Individual Time: 4008-6761 SLP Individual Time Calculation (min): 45 min  Short Term Goals: Week 3: SLP Short Term Goal 1 (Week 3): STGs=LTGs due to ELOS  Skilled Therapeutic Interventions: Patient received skilled SLP services targeting dysphagia. Patient completed ice chip trials x5. Oral phase of swallowing was functional. Intermittent multiple swallows per bolus were noted. Pharyngeal swallowing exercises were completed including: masako x10, CTAR x30, lingual resistance x30, effortful swallow x10, and mendhelson maneuver x10 requiring supervision for completion. Patient had questions regarding dysphagia. Patient was educated on his stroke location and the correlation to dysphagia. Patient verbalized understanding. At the end of therapy session patient was upright in bed, bed alarm activated, and all needs within reach.  Pain Pain Assessment Pain Scale: 0-10 Pain Score: 0-No pain  Therapy/Group: Individual Therapy  Cristy Folks 07/15/2019, 9:51 AM

## 2019-07-15 NOTE — Progress Notes (Signed)
[place] PHYSICAL MEDICINE & REHABILITATION PROGRESS NOTE   Subjective/Complaints: Pt feels he did bette with secretions overnight. Still retaining urine  ROS: Patient denies fever, rash, sore throat, blurred vision, nausea, vomiting, diarrhea, cough, shortness of breath or chest pain, joint or back pain, headache, or mood change.    Objective:   Dg Chest 2 View  Result Date: 07/14/2019 CLINICAL DATA:  Productive cough. EXAM: CHEST - 2 VIEW COMPARISON:  Radiograph of June 29, 2019. FINDINGS: Stable cardiomegaly. No pneumothorax or pleural effusion is noted. Minimal bibasilar subsegmental atelectasis is noted. Bony thorax is unremarkable. IMPRESSION: Minimal bibasilar subsegmental atelectasis. Electronically Signed   By: Marijo Conception M.D.   On: 07/14/2019 14:59   No results for input(s): WBC, HGB, HCT, PLT in the last 72 hours. No results for input(s): NA, K, CL, CO2, GLUCOSE, BUN, CREATININE, CALCIUM in the last 72 hours.  Intake/Output Summary (Last 24 hours) at 07/15/2019 1000 Last data filed at 07/15/2019 0550 Gross per 24 hour  Intake -  Output 2500 ml  Net -2500 ml     Physical Exam: Vital Signs Blood pressure 128/82, pulse 90, temperature 98 F (36.7 C), resp. rate 18, height 6\' 1"  (1.854 m), weight (!) 141.4 kg, SpO2 98 %. Constitutional: No distress . Vital signs reviewed. HEENT: EOMI, oral membranes moist Neck: supple Cardiovascular: RRR without murmur. No JVD    Respiratory: scattered rhonchi without wheezes or rales. Normal effort, coughing up a few secretions GI: BS +, non-tender, non-distended , peg site clean Skin: Warm and dry.  Intact. Psych: pleasant and cooperative Musc: No edema in extremities.  No tenderness in extremities. Neurological: Alert and follows all commands.  Motor: 4+/5 throughout,stable   Assessment/Plan: 1. 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  Physiatrist is providing close team supervision and 24 hour management of active medical problems listed below.  Physiatrist and rehab team continue to assess barriers to discharge/monitor patient progress toward functional and medical goals  Care Tool:  Bathing    Body parts bathed by patient: Right arm, Left arm, Abdomen, Chest, Right upper leg, Left upper leg, Face, Front perineal area, Buttocks, Right lower leg, Left lower leg   Body parts bathed by helper: Buttocks     Bathing assist Assist Level: Supervision/Verbal cueing     Upper Body Dressing/Undressing Upper body dressing   What is the patient wearing?: Pull over shirt    Upper body assist Assist Level: Set up assist    Lower Body Dressing/Undressing Lower body dressing      What is the patient wearing?: Underwear/pull up     Lower body assist Assist for lower body dressing: Supervision/Verbal cueing     Toileting Toileting    Toileting assist Assist for toileting: Supervision/Verbal cueing     Transfers Chair/bed transfer  Transfers assist  Chair/bed transfer activity did not occur: Safety/medical concerns  Chair/bed transfer assist level: Minimal Assistance - Patient > 75%(L AFO)     Locomotion Ambulation   Ambulation assist   Ambulation activity did not occur: Safety/medical concerns  Assist level: Minimal Assistance - Patient > 75%(L AFO) Assistive device: Walker-rolling Max distance: 150 ft   Walk 10 feet activity   Assist  Walk 10 feet activity did not occur: Safety/medical concerns  Assist level: Minimal Assistance - Patient > 75% Assistive device: Walker-rolling(L AFO)   Walk 50 feet activity   Assist Walk 50 feet  with 2 turns activity did not occur: Safety/medical concerns  Assist level: Minimal Assistance - Patient > 75% Assistive device: Walker-rolling(L AFO)    Walk 150 feet activity   Assist Walk 150 feet activity did not  occur: Safety/medical concerns  Assist level: Minimal Assistance - Patient > 75%(L AFO) Assistive device: Walker-rolling(L AFO)    Walk 10 feet on uneven surface  activity   Assist Walk 10 feet on uneven surfaces activity did not occur: Safety/medical concerns   Assist level: Minimal Assistance - Patient > 75% Assistive device: Walker-rolling, Orthosis   Wheelchair     Assist Will patient use wheelchair at discharge?: (TBD) Type of Wheelchair: Manual    Wheelchair assist level: Supervision/Verbal cueing Max wheelchair distance: 30ft    Wheelchair 50 feet with 2 turns activity    Assist    Wheelchair 50 feet with 2 turns activity did not occur: Safety/medical concerns   Assist Level: Supervision/Verbal cueing   Wheelchair 150 feet activity     Assist      Assist Level: Supervision/Verbal cueing   Blood pressure 128/82, pulse 90, temperature 98 F (36.7 C), resp. rate 18, height 6\' 1"  (1.854 m), weight (!) 141.4 kg, SpO2 98 %.  Medical Problem List and Plan: 1. Reduced Mobility, swllow function and self care secondary to left lateral medullary infarct, Wallenberg type syndrome, course complicated by multiple medical issues and prolonged hospital stay  Continue CIR therapies  -family ed friday  -dc likely for monday 2. Antithrombotics: -DVT/anticoagulation:Pharmaceutical:Lovenox -antiplatelet therapy: ASA.   Resumed Plavix 3. Pain Management:n/A 4. Mood/delirium: much improved -antipsychotic agents:       Continue HS seroquel at 2100     Continue current dose  -celexa added for anxiety  5. Neuropsych: This patientis ?not fullycapable of making decisions onhisown behalf. 6. Skin/Wound Care:Routine pressure relief measures. 7. Fluids/Electrolytes/Nutrition:  BMP within acceptable range on 9/21 8. T2DM: Hgb A1c-10.6. Now on lantus with SSI for elevated BS. Continue to monitor BS every 4 hours.   continue  lantus   33u CBG (last 3)  Recent Labs    07/14/19 1632 07/14/19 2108 07/15/19 0606  GLUCAP 157* 164* 97   Improving control. 9/25 9. Recurrent PNA--H flu on 9/7: Has been on prolonged antibiotics--narrowed to rocephin on 9/8-9/12  -afebrile currently  -9/10 CXR without acute disease. 10. Respiratory failure s/p trach/OSA:     -suspect he's clearing mucous plugs  - TID robinul to help manage secretions.    -successful decannulation, stoma closed   -proactive flutter valve, IS, OOB, etc  -CPAP  -repeat CXR 9/25 yesterday without infiltrates  -added robitussin dm thru peg 11. Tachycardia:   Resolved  12. Severe dysphagia: severe pharyngeal dysphagia  - PEG placed by IR on 9/15  -tolerating bolus feeds  -  MBS without improvement 13. Brief unresponsive episodes: Resolved  -likely vasovagal events related to trach suctioning  -EEG performed and did not demonstrate seizure activity  -Continue to monitor  14.  Morbid obesity  Continue to encourage weight loss 15.  Acute blood loss anemia  Hemoglobin 11.7 9/21 16. Urine retention:  -worst at night  -needs to be OOB to void at all times  -increased urecholine to 25mg  qid 9/25  -add flomax today        LOS: 18 days A FACE TO FACE EVALUATION WAS PERFORMED  07/15/2019, 10:00 AM

## 2019-07-15 NOTE — Plan of Care (Signed)
  Problem: Consults Goal: RH STROKE PATIENT EDUCATION Description: See Patient Education module for education specifics  Outcome: Progressing   Problem: RH BLADDER ELIMINATION Goal: RH STG MANAGE BLADDER WITH ASSISTANCE Description: STG Manage Bladder With max Assistance Outcome: Progressing   Problem: RH SAFETY Goal: RH STG ADHERE TO SAFETY PRECAUTIONS W/ASSISTANCE/DEVICE Description: STG Adhere to Safety Precautions With cues/reminders Assistance/Device. Outcome: Progressing   Problem: RH PAIN MANAGEMENT Goal: RH STG PAIN MANAGED AT OR BELOW PT'S PAIN GOAL Description: At or below level 5 Outcome: Progressing   Problem: RH KNOWLEDGE DEFICIT Goal: RH STG INCREASE KNOWLEDGE OF DIABETES Description: Pt and wife will be able to manage diabetes using handouts for medication, dietary restrictions and cbg monitoring independently Outcome: Progressing Goal: RH STG INCREASE KNOWLEDGE OF DYSPHAGIA/FLUID INTAKE Outcome: Progressing Goal: RH STG INCREASE KNOWLEGDE OF HYPERLIPIDEMIA Description: Pt and wife will be able to manage HLD using handouts for medication, dietary restrictions and monitoring independently Outcome: Progressing Goal: RH STG INCREASE KNOWLEDGE OF STROKE PROPHYLAXIS Description: Pt and wife will be able to manage risks for secondary stroke using handouts for medication, dietary restrictions independently Outcome: Progressing   Problem: RH BOWEL ELIMINATION Goal: RH STG MANAGE BOWEL WITH ASSISTANCE Description: STG Manage Bowel with  mod I Assistance. Outcome: Not Progressing

## 2019-07-16 ENCOUNTER — Inpatient Hospital Stay (HOSPITAL_COMMUNITY): Payer: Medicare Other

## 2019-07-16 LAB — GLUCOSE, CAPILLARY
Glucose-Capillary: 108 mg/dL — ABNORMAL HIGH (ref 70–99)
Glucose-Capillary: 110 mg/dL — ABNORMAL HIGH (ref 70–99)
Glucose-Capillary: 141 mg/dL — ABNORMAL HIGH (ref 70–99)
Glucose-Capillary: 87 mg/dL (ref 70–99)

## 2019-07-16 NOTE — Progress Notes (Signed)
Physical Therapy Session Note  Patient Details  Name: Michael Bright MRN: [id number] Date of Birth: 1956/04/22  Today's Date: 07/16/2019 PT Individual Time: 1102-1156 PT Individual Time Calculation (min): 54 min   Short Term Goals: Week 3:  PT Short Term Goal 1 (Week 3): STG = LTG due to ELOS.  Skilled Therapeutic Interventions/Progress Updates:    Pt seated in w/c upon PT arrival, agreeable to therapy tx and denies pain. Pt transported to the gym. Pt ambulated x 160 ft with RW and CGA working on endurance and gait, min cues for L LE awareness/placement during turns- SpO2 99% following activity while on RA throughout session. Pt ambulated x 60 ft to the steps with RW and CGA and then ascended/descended 4 steps with B rails and min assist, cues for techniques and safety, step to pattern. Pt transported to the ortho gym, ambulated x 20 ft to the car and performed car transfer with CGA. Pt ambulated on unlevel surfaces across ramp with RW and min assist, cues for safety. Pt transported back to gym and performed stand pivot this session w/c<>mat with CGA and RW, pt performed bed mobility on the mat without bedrails and with supervision. Pt performed x 10 sit<>stands and x 20 standing marches for LE strenghthening and balance. Sit<>stands throughout session from w/c with CGA-min assist. Pt transported back to room and left in w/c with needs in reach and chair alarm set.   Therapy Documentation Precautions:  Precautions Precautions: Fall, Other (comment) Precaution Comments: PEG, watch BP, HR & O2, Required Braces or Orthoses: Other Brace Other Brace: L AFO Restrictions Weight Bearing Restrictions: No    Therapy/Group: Individual Therapy   Netta Corrigan, PT, DPT, CSRS 07/16/19  12:04 PM    07/16/2019, 12:04 PM

## 2019-07-16 NOTE — Progress Notes (Signed)
Continues with intermittent, congested, wet sounded cough. Patient reports not as bad when sitting in chair. Encouraged patient to keep HOB elevated at all times, especially after tube feeds/meds. Educated patient on need to use I.S and flutter valve, he agreed.  LBM 09/21, refused PRN LOC. "I'll take it tomorrow."  May need to schedule liquid senna? Per previous report, up to BR, bladder scan=0. At 2345, ambulated to BR, voided, bladder scan=112cc's!! Patient encouraged about starting to void. Patrici Ranks A

## 2019-07-16 NOTE — Progress Notes (Signed)
[place] PHYSICAL MEDICINE & REHABILITATION PROGRESS NOTE   Subjective/Complaints: Says coughing better this weekend. RN didn't get him up to toilet this am because he was light-headed--wonders if it was flomax. otherwise emptied a little better yesterday---was cathed twice  ROS: Patient denies fever, rash, sore throat, blurred vision, nausea, vomiting, diarrhea, cough, shortness of breath or chest pain, joint or back pain, headache, or mood change.    Objective:   Dg Chest 2 View  Result Date: 07/14/2019 CLINICAL DATA:  Productive cough. EXAM: CHEST - 2 VIEW COMPARISON:  Radiograph of June 29, 2019. FINDINGS: Stable cardiomegaly. No pneumothorax or pleural effusion is noted. Minimal bibasilar subsegmental atelectasis is noted. Bony thorax is unremarkable. IMPRESSION: Minimal bibasilar subsegmental atelectasis. Electronically Signed   By: Lupita Raider M.D.   On: 07/14/2019 14:59   No results for input(s): WBC, HGB, HCT, PLT in the last 72 hours. No results for input(s): NA, K, CL, CO2, GLUCOSE, BUN, CREATININE, CALCIUM in the last 72 hours.  Intake/Output Summary (Last 24 hours) at 07/16/2019 0918 Last data filed at 07/16/2019 6812 Gross per 24 hour  Intake -  Output 925 ml  Net -925 ml     Physical Exam: Vital Signs Blood pressure 121/76, pulse 96, temperature 98.2 F (36.8 C), temperature source Oral, resp. rate 18, height 6\' 1"  (1.854 m), weight (!) 141.4 kg, SpO2 97 %. Constitutional: No distress . Vital signs reviewed. HEENT: EOMI, oral membranes moist Neck: supple Cardiovascular: RRR without murmur. No JVD    Respiratory: scattered rhonchi without wheezes or rales. Normal effort    GI: BS +, non-tender, non-distended , PEG site intact Skin: Warm and dry.  Intact. Psych: pleasant and cooperative Musc: No edema in extremities.  No tenderness in extremities. Neurological: Alert and follows all commands.  Motor: 4+/5 throughout,stable   Assessment/Plan: 1.  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  Physiatrist is providing close team supervision and 24 hour management of active medical problems listed below.  Physiatrist and rehab team continue to assess barriers to discharge/monitor patient progress toward functional and medical goals  Care Tool:  Bathing    Body parts bathed by patient: Right arm, Left arm, Abdomen, Chest, Right upper leg, Left upper leg, Face, Front perineal area, Buttocks, Right lower leg, Left lower leg   Body parts bathed by helper: Buttocks     Bathing assist Assist Level: Supervision/Verbal cueing     Upper Body Dressing/Undressing Upper body dressing   What is the patient wearing?: Pull over shirt    Upper body assist Assist Level: Set up assist    Lower Body Dressing/Undressing Lower body dressing      What is the patient wearing?: Pants     Lower body assist Assist for lower body dressing: Supervision/Verbal cueing     Toileting Toileting    Toileting assist Assist for toileting: Supervision/Verbal cueing     Transfers Chair/bed transfer  Transfers assist  Chair/bed transfer activity did not occur: Safety/medical concerns  Chair/bed transfer assist level: Supervision/Verbal cueing     Locomotion Ambulation   Ambulation assist   Ambulation activity did not occur: Safety/medical concerns  Assist level: Minimal Assistance - Patient > 75%(L AFO) Assistive device: Walker-rolling Max distance: 150 ft   Walk 10 feet activity   Assist  Walk 10 feet activity did not occur: Safety/medical concerns  Assist level: Minimal Assistance - Patient > 75% Assistive device:  Walker-rolling(L AFO)   Walk 50 feet activity   Assist Walk 50 feet with 2 turns activity did not occur: Safety/medical concerns  Assist level: Minimal Assistance - Patient > 75% Assistive device:  Walker-rolling(L AFO)    Walk 150 feet activity   Assist Walk 150 feet activity did not occur: Safety/medical concerns  Assist level: Minimal Assistance - Patient > 75%(L AFO) Assistive device: Walker-rolling(L AFO)    Walk 10 feet on uneven surface  activity   Assist Walk 10 feet on uneven surfaces activity did not occur: Safety/medical concerns   Assist level: Minimal Assistance - Patient > 75% Assistive device: Walker-rolling, Orthosis   Wheelchair     Assist Will patient use wheelchair at discharge?: (TBD) Type of Wheelchair: Manual    Wheelchair assist level: Supervision/Verbal cueing Max wheelchair distance: 4050ft    Wheelchair 50 feet with 2 turns activity    Assist    Wheelchair 50 feet with 2 turns activity did not occur: Safety/medical concerns   Assist Level: Supervision/Verbal cueing   Wheelchair 150 feet activity     Assist      Assist Level: Supervision/Verbal cueing   Blood pressure 121/76, pulse 96, temperature 98.2 F (36.8 C), temperature source Oral, resp. rate 18, height 6\' 1"  (1.854 m), weight (!) 141.4 kg, SpO2 97 %.  Medical Problem List and Plan: 1. Reduced Mobility, swllow function and self care secondary to left lateral medullary infarct, Wallenberg type syndrome, course complicated by multiple medical issues and prolonged hospital stay  Continue CIR therapies  -family ed took place friday  -dc tomorrow (see pulmonary/urologic issues below)  -Patient to see Rehab MD/provider in the office for transitional care encounter in 1-2 weeks.   -HH Follow up including RN 2. Antithrombotics: -DVT/anticoagulation:Pharmaceutical:Lovenox -antiplatelet therapy: ASA.   Resumed Plavix 3. Pain Management:n/A 4. Mood/delirium: much improved -antipsychotic agents:       Continue HS seroquel at 2100     Continue current dose  -celexa added for anxiety  5. Neuropsych: This patientis ?not fullycapable of  making decisions onhisown behalf. 6. Skin/Wound Care:Routine pressure relief measures. 7. Fluids/Electrolytes/Nutrition:  BMP within acceptable range on 9/21 8. T2DM: Hgb A1c-10.6. Now on lantus with SSI for elevated BS. Continue to monitor BS every 4 hours.   continue  lantus  33u CBG (last 3)  Recent Labs    07/15/19 1708 07/15/19 2130 07/16/19 0616  GLUCAP 111* 136* 108*   Improved control. 9/27 9. Recurrent PNA--H flu on 9/7: Has been on prolonged antibiotics--narrowed to rocephin on 9/8-9/12  -afebrile currently  -9/10 CXR without acute disease. 10. Respiratory failure s/p trach/OSA:     -suspect he's clearing mucous plugs  - TID robinul to help manage secretions.    -successful decannulation, stoma closed   -proactive flutter valve, IS, OOB, etc  -CPAP  -repeat CXR 9/25  without infiltrates  -added robitussin dm thru peg with improved cough 11. Tachycardia:   Resolved  12. Severe dysphagia: severe pharyngeal dysphagia  - PEG placed by IR on 9/15  -tolerating bolus feeds  -  MBS without improvement 13. Brief unresponsive episodes: Resolved  -likely vasovagal events related to trach suctioning  -EEG performed and did not demonstrate seizure activity  -Continue to monitor  14.  Morbid obesity  Continue to encourage weight loss 15.  Acute blood loss anemia  Hemoglobin 11.7 9/21 16. Urine retention:  -worst at night  -needs to be OOB to void at all times  -continue urecholine 25mg  qid 9/25  -  added flomax 9/26---observe for bp/tolerance   -asked RN to educate pt/wife on self-cath  -make outpt urology referral        LOS: 19 days A FACE TO [place] 07/16/2019, 9:18 AM

## 2019-07-16 NOTE — Progress Notes (Signed)
At 0630, attempted to ambulate patient to BR, very unsteady, for safety reasons, returned patient to bed. Sat on EOB initially with complaints of feeling dizzy. Bladder scan=434, I & O cath=500cc's. Only wanted 1 carton of Osmolite (352ml), this AM.  Reports makes him feel too full. Patrici Ranks A

## 2019-07-16 NOTE — Progress Notes (Addendum)
Physical Therapy Discharge Summary  Patient Details  Name: Michael Bright MRN: [id number] Date of Birth: 07-12-1956   Patient has met 7 of 8 long term goals due to improved activity tolerance, improved balance, improved postural control, increased strength, ability to compensate for deficits, improved attention and improved awareness.  Patient to discharge at an ambulatory level [place].   Patient's care partner is independent to provide the necessary physical and cognitive assistance at discharge.  Pt did not meet CGA stair goal as he requires min assist 2/2 impaired balance, strength.  Recommendation:  Patient will benefit from ongoing skilled PT services in home health setting to continue to advance safe functional mobility, address ongoing impairments in balance, strength, mobility, and minimize fall risk.  Equipment: w/c, RW , L AFO  Reasons for discharge: treatment goals met  Patient/family agrees with progress made and goals achieved: Yes  PT Discharge Precautions/Restrictions Precautions Precautions: Fall;Other (comment) Precaution Comments: PEG, watch BP, HR & O2, Required Braces or Orthoses: Other Brace Other Brace: L AFO Restrictions Weight Bearing Restrictions: No  Pain Pain Assessment Pain Scale: 0-10 Pain Score: 10-Worst pain ever Pain Type: Acute pain Pain Location: Shoulder Pain Orientation: Left Pain Descriptors / Indicators: Aching Pain Frequency: Intermittent Pain Onset: On-going Patients Stated Pain Goal: 0 Pain Intervention(s): Medication (See eMAR) Cognition Overall Cognitive Status: Impaired/Different from baseline Arousal/Alertness: Awake/alert Orientation Level: Oriented X4 Focused Attention: Appears intact Sustained Attention: Appears intact Selective Attention: Appears intact Safety/Judgment: Appears intact   Sensation Sensation Light Touch: Appears Intact Proprioception: Appears Intact Coordination Gross Motor Movements are Fluid  and Coordinated: No Fine Motor Movements are Fluid and Coordinated: No Coordination and Movement Description: impaired 2/2 weakness Heel Shin Test: slightly more impaired LLE   Motor  Motor Motor: Abnormal postural alignment and control Motor - Discharge Observations: L hemi, LLE ataxia, generalized deconditioning   Mobility Bed Mobility Bed Mobility: Supine to Sit;Sit to Supine Supine to Sit: Supervision/Verbal cueing Sit to Supine: Supervision/Verbal cueing Transfers Transfers: Sit to Stand;Stand to Sit Sit to Stand: Contact Guard/Touching assist Stand to Sit: Supervision/Verbal cueing Stand Pivot Transfers: Contact Guard/Touching assist Stand Pivot Transfer Details: Verbal cues for technique;Verbal cues for precautions/safety Transfer (Assistive device): Rolling walker   Locomotion  Gait Ambulation: Yes Gait Assistance: Contact Guard/Touching assist Gait Distance (Feet): 160 Feet Assistive device: Rolling walker Gait Assistance Details: Verbal cues for technique;Verbal cues for precautions/safety;Verbal cues for safe use of DME/AE Gait Gait: Yes Gait Pattern: Decreased stride length;Decreased step length - left;Decreased step length - right;Decreased stance time - left Gait velocity: decreased Stairs / Additional Locomotion Stairs: Yes Stairs Assistance: Minimal Assistance - Patient > 75% Stair Management Technique: Two rails Number of Stairs: 4 Height of Stairs: 6 Wheelchair Mobility Wheelchair Mobility: No   Trunk/Postural Assessment  Cervical Assessment Cervical Assessment: Exceptions to WFL(forward head posture) Thoracic Assessment Thoracic Assessment: Exceptions to WFL(rounded shoulders) Lumbar Assessment Lumbar Assessment: Exceptions to WFL(posterior pelvic tilt) Postural Control Postural Control: Deficits on evaluation Righting Reactions: delayed Protective Responses: delayed   Balance Balance Balance Assessed: Yes Static Sitting Balance Static  Sitting - Level of Assistance: 5: Stand by assistance Dynamic Sitting Balance Dynamic Sitting - Level of Assistance: 5: Stand by assistance Static Standing Balance Static Standing - Level of Assistance: 5: Stand by assistance Dynamic Standing Balance Dynamic Standing - Level of Assistance: 5: Stand by assistance;4: Min assist   Extremity Assessment  RLE Assessment General Strength Comments: generalized weakness LLE Assessment General Strength Comments: generalized weakness   Drema Dallas  Schagen, PT, DPT, CSRS 07/16/19  11:44 AM  Lavone Nian, PT, DPT 07/17/2019, 12:46 PM

## 2019-07-16 NOTE — Progress Notes (Signed)
Occupational Therapy Session Note  Patient Details  Name: Michael Bright MRN: [id number] Date of Birth: [birth year]  Today's Date: 07/16/2019 OT Individual Time: 0388-8280 OT Individual Time Calculation (min): 55 min    Short Term Goals: Week 2:  OT Short Term Goal 1 (Week 2): Pt will don pants with CGA OT Short Term Goal 1 - Progress (Week 2): Met OT Short Term Goal 2 (Week 2): Pt will require no more than 2 rest breaks in 30 min to manage secretions without suction OT Short Term Goal 2 - Progress (Week 2): Met OT Short Term Goal 3 (Week 2): Pt will complete standing level grooming task with all VSS for > 3 minutes OT Short Term Goal 3 - Progress (Week 2): Met  Skilled Therapeutic Interventions/Progress Updates:    1:1. Pt received in bed agreeable to OT and requesting tylenol for L shoulder. Heat provided at end of session for relief. tp completes supine>sitting EOB with S, dons patnts at sit to stand with S and grooms at sink with s/u. Pt dons socks, shoes and AFO with S! Pt completes standing basket ball shooting with CGA for dynamic balance with no UE support. Pt completes sit to stand in between each horse shoe thrown (2x6) with S-CGA and min VC for footplacement and trunk flexion to imrpvoe transitional movement required for ADLs. With icreased reps pt fatigues requiring increased assistance. Exited session with p tseated in w/c, call light in reach and exita alarm on  Therapy Documentation Precautions:  Precautions Precautions: Fall, Other (comment) Precaution Comments: PEG, watch BP, HR & O2, Required Braces or Orthoses: Other Brace Other Brace: L AFO Restrictions Weight Bearing Restrictions: No General:   Vital Signs: Therapy Vitals Temp: 98.2 F (36.8 C) Temp Source: Oral Pulse Rate: 96 Resp: 18 BP: 121/76 Patient Position (if appropriate): Sitting Oxygen Therapy SpO2: 97 % O2 Device: Room Air Pain:   ADL: ADL Eating: NPO Grooming: Setup Where  Assessed-Grooming: Sitting at sink Upper Body Bathing: Supervision/safety Where Assessed-Upper Body Bathing: Edge of bed Lower Body Bathing: Moderate assistance Where Assessed-Lower Body Bathing: Edge of bed Upper Body Dressing: Minimal assistance Where Assessed-Upper Body Dressing: Edge of bed Lower Body Dressing: Moderate assistance Where Assessed-Lower Body Dressing: Edge of bed Toileting: Moderate assistance Where Assessed-Toileting: Bedside Commode, Toilet Toilet Transfer: Moderate assistance Toilet Transfer Method: Counselling psychologist: Bedside commode, Grab bars Vision   Perception    Praxis   Exercises:   Other Treatments:     Therapy/Group: Individual Therapy  Tonny Branch 07/16/2019, 8:31 AM

## 2019-07-17 LAB — GLUCOSE, CAPILLARY: Glucose-Capillary: 111 mg/dL — ABNORMAL HIGH (ref 70–99)

## 2019-07-17 LAB — CBC
HCT: 35.1 % — ABNORMAL LOW (ref 39.0–52.0)
Hemoglobin: 12 g/dL — ABNORMAL LOW (ref 13.0–17.0)
MCH: 30.9 pg (ref 26.0–34.0)
MCHC: 34.2 g/dL (ref 30.0–36.0)
MCV: 90.5 fL (ref 80.0–100.0)
Platelets: 236 10*3/uL (ref 150–400)
RBC: 3.88 MIL/uL — ABNORMAL LOW (ref 4.22–5.81)
RDW: 12.3 % (ref 11.5–15.5)
WBC: 7.2 10*3/uL (ref 4.0–10.5)
nRBC: 0 % (ref 0.0–0.2)

## 2019-07-17 LAB — BASIC METABOLIC PANEL
Anion gap: 10 (ref 5–15)
BUN: 14 mg/dL (ref 8–23)
CO2: 28 mmol/L (ref 22–32)
Calcium: 8.9 mg/dL (ref 8.9–10.3)
Chloride: 101 mmol/L (ref 98–111)
Creatinine, Ser: 0.78 mg/dL (ref 0.61–1.24)
GFR calc Af Amer: >60 mL/min (ref >60)
GFR calc non Af Amer: >60 mL/min (ref >60)
Glucose, Bld: 123 mg/dL — ABNORMAL HIGH (ref 70–99)
Potassium: 4.1 mmol/L (ref 3.5–5.1)
Sodium: 139 mmol/L (ref 135–145)

## 2019-07-17 MED ORDER — CLOPIDOGREL BISULFATE 75 MG PO TABS: 75.0000 mg | ORAL_TABLET | Freq: Every day | ORAL | 0 refills | Status: DC

## 2019-07-17 MED ORDER — FREE WATER: 200.0000 mL | Freq: Three times a day (TID) | Status: DC

## 2019-07-17 MED ORDER — AMLODIPINE BESYLATE 10 MG PO TABS: 10.0000 mg | ORAL_TABLET | Freq: Every day | ORAL | 1 refills | Status: DC

## 2019-07-17 MED ORDER — ACETAMINOPHEN 325 MG PO TABS: 325.0000 mg | ORAL_TABLET | ORAL | Status: DC | PRN

## 2019-07-17 MED ORDER — INSULIN GLARGINE 100 UNIT/ML SOLOSTAR PEN: 33.0000 [IU] | PEN_INJECTOR | Freq: Every day | SUBCUTANEOUS | 11 refills | Status: DC

## 2019-07-17 MED ORDER — PEN NEEDLES 32G X 6 MM MISC: 1.0000 "application " | Freq: Every day | 0 refills | Status: DC

## 2019-07-17 MED ORDER — ATORVASTATIN CALCIUM 40 MG PO TABS: 40.0000 mg | ORAL_TABLET | Freq: Every day | ORAL | 0 refills | Status: DC

## 2019-07-17 MED ORDER — INSULIN GLARGINE 100 UNIT/ML ~~LOC~~ SOLN: 33.0000 [IU] | Freq: Every day | SUBCUTANEOUS | 11 refills | Status: DC

## 2019-07-17 MED ORDER — OSMOLITE 1.5 CAL PO LIQD: 474.0000 mL | Freq: Three times a day (TID) | ORAL | 0 refills | Status: DC

## 2019-07-17 MED ORDER — BLOOD GLUCOSE MONITOR KIT: PACK | 0 refills | Status: DC

## 2019-07-17 MED ORDER — BETHANECHOL CHLORIDE 25 MG PO TABS: 25.0000 mg | ORAL_TABLET | Freq: Four times a day (QID) | ORAL | 0 refills | Status: DC

## 2019-07-17 MED ORDER — ORAL CARE MOUTH RINSE: 15.0000 mL | Freq: Every day | OROMUCOSAL | 1 refills | Status: AC

## 2019-07-17 MED ORDER — PRO-STAT SUGAR FREE PO LIQD: 30.0000 mL | Freq: Two times a day (BID) | ORAL | 0 refills | Status: DC

## 2019-07-17 MED ORDER — GUAIFENESIN-DM 100-10 MG/5ML PO SYRP: 15.0000 mL | ORAL_SOLUTION | Freq: Four times a day (QID) | ORAL | 0 refills | Status: DC

## 2019-07-17 MED ORDER — BUDESONIDE 0.25 MG/2ML IN SUSP: 0.2500 mg | Freq: Two times a day (BID) | RESPIRATORY_TRACT | 1 refills | Status: DC

## 2019-07-17 MED ORDER — FAMOTIDINE 40 MG/5ML PO SUSR: 20.0000 mg | Freq: Two times a day (BID) | ORAL | 0 refills | Status: DC

## 2019-07-17 MED ORDER — QUETIAPINE FUMARATE 100 MG PO TABS: 100.0000 mg | ORAL_TABLET | Freq: Every day | ORAL | 0 refills | Status: DC

## 2019-07-17 MED ORDER — TAMSULOSIN HCL 0.4 MG PO CAPS: ORAL_CAPSULE | ORAL | 0 refills | Status: DC

## 2019-07-17 MED ORDER — FINASTERIDE 5 MG PO TABS: ORAL_TABLET | ORAL | 0 refills | Status: DC

## 2019-07-17 MED ORDER — CITALOPRAM HYDROBROMIDE 10 MG PO TABS: 10.0000 mg | ORAL_TABLET | Freq: Every day | ORAL | 0 refills | Status: DC

## 2019-07-17 MED ORDER — GLYCOPYRROLATE 1 MG PO TABS: 2.0000 mg | ORAL_TABLET | Freq: Two times a day (BID) | ORAL | 0 refills | Status: DC

## 2019-07-17 NOTE — Progress Notes (Signed)
Social Work Discharge Note   The overall goal for the admission was met for:   Discharge location: Yes - home with wife who can provide 24/7 assistance  Length of Stay: No - extended a few days due to medical - LOS=20 days  Discharge activity level: Yes - supervision  Home/community participation: Yes  Services provided included: MD, RD, PT, OT, SLP, RN, TR, Pharmacy, Neuropsych and SW  Financial Services: Medicare and Private Insurance: [place]  Follow-up services arranged: Home Health: RN, PT, OT, ST via [facility], DME: 22x18 lightweight w/c, cushion, wide rolling walker, suction machine, peg tube feedings and home nebulizer via [facility] and Patient/Family has no preference for HH/DME agencies  Comments (or additional information):    Contact info:  Wife, Donzell Coller @ [phone]  Patient/Family verbalized understanding of follow-up arrangements: Yes  Individual responsible for coordination of the follow-up plan: pt/wife   Confirmed correct DME delivered: Lennart Pall 07/17/2019    Ventura Hollenbeck, Lorre Nick

## 2019-07-17 NOTE — Discharge Instructions (Addendum)
Inpatient Rehab Discharge Instructions  Michael Bright Discharge date and time: 07/17/19   Activities/Precautions/ Functional Status: Activity: no lifting, driving, or strenuous exercise till cleared by MD Diet: Nothing by mouth. Need to do oral care 5 times a day.  Wound Care: Keep PEG site clean and dry. Cleanse around tube with mild soap and water--then pat dry.    Functional status:  ___ No restrictions     ___ Walk up steps independently _X__ 24/7 supervision/assistance   ___ Walk up steps with assistance ___ Intermittent supervision/assistance  ___ Bathe/dress independently ___ Walk with walker     _X__ Bathe/dress with assistance ___ Walk Independently    ___ Shower independently _X__ Walk with assistance    ___ Shower with assistance ___ No alcohol     ___ Return to work/school ________  COMMUNITY REFERRALS UPON DISCHARGE:    Home Health:   PT, OT, SP, RN    Agency:[facility]   Phone:[phone]     Medical Equipment/Items Ordered:WHEELCHAIR, Levan Hurst, YONKERS SUNCTION MACHINE AND OSMOLITE-TUBE FEEDINGS and home nebulizer machine  Agency/Supplier:[facility]   [phone]   Special Instructions: 1. Monitor blood sugars before tube feeds and at bedtime.  2. Attempt to stand and void every 2-3 hours. Cath every 4-5 hours to keep bladder volumes less than 350 cc. 3. Need to use CPAP whenever you are napping or sleeping.    STROKE/TIA DISCHARGE INSTRUCTIONS SMOKING Cigarette smoking nearly doubles your risk of having a stroke & is the single most alterable risk factor  If you smoke or have smoked in the last 12 months, you are advised to quit smoking for your health.  Most of the excess cardiovascular risk related to smoking disappears within a year of stopping.  Ask you doctor about anti-smoking medications  [place] Quit Line: 1-800-QUIT NOW  Free Smoking Cessation Classes [phone]  CHOLESTEROL Know your levels; limit fat & cholesterol in your diet    Lipid Panel     Component Value Date/Time   CHOL 196 06/01/2019 0314   TRIG 184 (H) 06/06/2019 0256   HDL 50 06/01/2019 0314   CHOLHDL 3.9 06/01/2019 0314   VLDL 11 06/01/2019 0314   LDLCALC 135 (H) 06/01/2019 0314      Many patients benefit from treatment even if their cholesterol is at goal.  Goal: Total Cholesterol (CHOL) less than 160  Goal:  Triglycerides (TRIG) less than 150  Goal:  HDL greater than 40  Goal:  LDL (LDLCALC) less than 100   BLOOD PRESSURE American Stroke Association blood pressure target is less that 120/80 mm/Hg  Your discharge blood pressure is:  BP: 124/72  Monitor your blood pressure  Limit your salt and alcohol intake  Many individuals will require more than one medication for high blood pressure  DIABETES (A1c is a blood sugar average for last 3 months) Goal HGBA1c is under 7% (HBGA1c is blood sugar average for last 3 months)  Diabetes:     Lab Results  Component Value Date   HGBA1C 10.6 (H) 05/30/2019     Your HGBA1c can be lowered with medications, healthy diet, and exercise.  Check your blood sugar as directed by your physician  Call your physician if you experience unexplained or low blood sugars.  PHYSICAL ACTIVITY/REHABILITATION Goal is 30 minutes at least 4 days per week  Activity: No driving, Therapies: see above Return to work: N/A  Activity decreases your risk of heart attack and stroke and makes your heart stronger.  It  helps control your weight and blood pressure; helps you relax and can improve your mood.  Participate in a regular exercise program.  Talk with your doctor about the best form of exercise for you (dancing, walking, swimming, cycling).  DIET/WEIGHT Goal is to maintain a healthy weight  Your discharge diet is:  Diet Order            Diet NPO time specified  Diet effective now            liquids Your height is:  Height: 6\' 1"  (185.4 cm) Your current weight is: Weight: (!) 141.4 kg Your Body Mass  Index (BMI) is:  BMI (Calculated): 41.14  Following the type of diet specifically designed for you will help prevent another stroke.  Your goal weight is:    Your goal Body Mass Index (BMI) is 19-24.  Healthy food habits can help reduce 3 risk factors for stroke:  High cholesterol, hypertension, and excess weight.  RESOURCES Stroke/Support Group:  Call [phone]   STROKE EDUCATION PROVIDED/REVIEWED AND GIVEN TO PATIENT Stroke warning signs and symptoms How to activate emergency medical system (call 911). Medications prescribed at discharge. Need for follow-up after discharge. Personal risk factors for stroke. Pneumonia vaccine given:  Flu vaccine given:  My questions have been answered, the writing is legible, and I understand these instructions.  I will adhere to these goals & educational materials that have been provided to me after my discharge from the hospital.     My questions have been answered and I understand these instructions. I will adhere to these goals and the provided educational materials after my discharge from the hospital.  Patient/Caregiver Signature _______________________________ Date __________  Clinician Signature _______________________________________ Date __________  Please bring this form and your medication list with you to all your follow-up doctor's appointments.

## 2019-07-17 NOTE — Discharge Summary (Signed)
Physician Discharge Summary  Patient ID: Michael Bright MRN: [id number] DOB/AGE: September 20, 1956 63 y.o.  Admit date: 06/27/2019 Discharge date: 07/17/2019  Discharge Diagnoses:  Principal Problem:   Stroke, small vessel ([place]) Active Problems:   Essential hypertension   Dysphagia due to recent cerebral infarction   Diabetes mellitus type 2 in obese [facility])   Morbid obesity ([place])   Status post insertion of percutaneous endoscopic gastrostomy (PEG) tube ([place])   Acute blood loss anemia   Discharged Condition: stable   Significant Diagnostic Studies: Ct Abdomen Wo Contrast  Result Date: 06/28/2019 CLINICAL DATA:  Dysphagia. Evaluate anatomy for percutaneous gastrostomy tube placement. EXAM: CT ABDOMEN WITHOUT CONTRAST TECHNIQUE: Multidetector CT imaging of the abdomen was performed following the standard protocol without IV contrast. COMPARISON:  Chest CT 06/26/2019 FINDINGS: Lower chest: Stable areas of consolidation involving both lower lobes. No significant pleural effusions. Hepatobiliary: Cholecystectomy. Normal appearance of the liver on this non contrast examination. Pancreas: Unremarkable. No pancreatic ductal dilatation or surrounding inflammatory changes. Spleen: Normal in size without focal abnormality. Adrenals/Urinary Tract: Normal adrenal glands. At least 2 tiny stones in left kidney lower pole. Tiny stone in the right kidney upper pole. Negative for hydronephrosis. Top of the bladder is partially visualized. Stomach/Bowel: There is a nasogastric tube with the tip in the distal stomach. There is a small amount of barium in the gastric fundus. Normal appearance of the stomach and duodenum. Barium within the transverse colon which is caudal to the stomach. The anatomy is amendable for percutaneous gastrostomy tube placement. No evidence to suggest a bowel obstruction. Evidence for high-riding cecum. Visualized appendix is unremarkable. Vascular/Lymphatic: Small amount of calcium involving the  right common iliac artery. Otherwise, the visualized vascular structures are unremarkable. No significant lymph node enlargement in the abdomen. Other: Negative for ascites.  Negative for free air. Musculoskeletal: No acute bone abnormality. IMPRESSION: 1. Anatomy is suitable for percutaneous gastrostomy tube placement. However, the barium will need to pass through the stomach prior to gastrostomy tube placement. 2. Stable consolidation and volume loss in the bilateral lower lobes. Findings are similar to the recent chest CT. 3. Nonobstructive tiny kidney stones. Electronically Signed   By: Markus Daft M.D.   On: 06/28/2019 10:32   Dg Chest 1 View  Result Date: 06/29/2019 CLINICAL DATA:  Aspiration EXAM: CHEST  1 VIEW COMPARISON:  06/27/2019 FINDINGS: Cardiac shadow is enlarged but stable. Tracheostomy tube and feeding catheter are seen. Lungs are well aerated bilaterally. Minimal left basilar atelectasis is noted. No bony abnormality is seen. IMPRESSION: Minimal left basilar atelectasis. Electronically Signed   By: Inez Catalina M.D.   On: 06/29/2019 08:58   Dg Chest 2 View  Result Date: 07/14/2019 CLINICAL DATA:  Productive cough. EXAM: CHEST - 2 VIEW COMPARISON:  Radiograph of June 29, 2019. FINDINGS: Stable cardiomegaly. No pneumothorax or pleural effusion is noted. Minimal bibasilar subsegmental atelectasis is noted. Bony thorax is unremarkable. IMPRESSION: Minimal bibasilar subsegmental atelectasis. Electronically Signed   By: Marijo Conception M.D.   On: 07/14/2019 14:59   Dg Chest 2 View  Result Date: 06/27/2019 CLINICAL DATA:  Aspiration EXAM: CHEST - 2 VIEW COMPARISON:  06/22/2019, CT 06/26/2019 FINDINGS: Tracheostomy tube is in place. Streaky basilar airspace opacities without significant change. Stable cardiomediastinal silhouette. No pneumothorax. IMPRESSION: 1. Similar appearance of patchy basilar airspace opacities. No new abnormality is evident. 2. Borderline cardiomegaly Electronically  Signed   By: Donavan Foil M.D.   On: 06/27/2019 21:58  Labs:  Basic Metabolic Panel: BMP Latest Ref Rng & Units 07/17/2019 07/10/2019 07/03/2019  Glucose 70 - 99 mg/dL 123(H) 88 130(H)  BUN 8 - 23 mg/dL '14 11 15  '$ Creatinine 0.61 - 1.24 mg/dL 0.78 0.69 0.78  Sodium 135 - 145 mmol/L 139 140 136  Potassium 3.5 - 5.1 mmol/L 4.1 4.0 3.9  Chloride 98 - 111 mmol/L 101 105 104  CO2 22 - 32 mmol/L '28 28 25  '$ Calcium 8.9 - 10.3 mg/dL 8.9 8.7(L) 8.4(L)    CBC: CBC Latest Ref Rng & Units 07/17/2019 07/10/2019 07/03/2019  WBC 4.0 - 10.5 K/uL 7.2 5.6 6.0  Hemoglobin 13.0 - 17.0 g/dL 12.0(L) 11.7(L) 11.3(L)  Hematocrit 39.0 - 52.0 % 35.1(L) 35.2(L) 34.9(L)  Platelets 150 - 400 K/uL 236 211 227    CBG: Recent Labs  Lab 07/16/19 0616 07/16/19 1212 07/16/19 1622 07/16/19 2106 07/17/19 0632  GLUCAP 108* 110* 141* 87 111*    Brief HPI:   Michael Bright is a 63 y.o. male with history of T2DM, morbid obesity, lack of medical care for > 10 years who was admitted on 06/28/19 with 2 day history of left facial weakness, left gaze preference, LLE weakness and numbness and difficulty swallowing saliva. MRI brain revealed acute small left medullary stroke.  He had decline in respiratory status with increasing WOB felt to be due to aspiration and inability to handle secretion.  He was transferred to ICU for closer monitoring.  Scopolamine patch and Robinul was added as needed for management of secretions but he required intubation on zero 8/15.  MSSA aspiration pneumonia treated with cefazolin.  Trach placed by Dr. day and on zero 8/21 and he was transitioned to ATC by 8/23.  He has had issues with delirium as well as paranoia, vomiting episodes due to TF with recurrent fevers on 8/24.  He was kept n.p.o. and Unasyn added for tracheobronchitis.  He did develop respiratory distress with AMS on  09/2 and IV Robinul and SL atropine added to help manage secretions in addition to  Pulmicort nebs for respiratory  support.  He was tolerating PMSV trials and MBS done on 09/08 showing frank aspiration of all textures before, during and after swallow with consistent cough therefore alternative means of nutritional support recommended. His  BP control has improved with addition of Norvasc and anxiety/agitation had resolved on Seroquel.  PCCM felt that patient had severe OSA and would need CPAP once decannulated.  Stroke felt to be due to small vessel disease and DAPT x3 months recommended by neurology.  Therapy ongoing patient limited by LLE instability, difficulty with activity, severe dysphagia as well as some expressive deficits.  CIR recommended due to functional decline   Hospital Course: Jacon Whetzel was admitted to rehab 06/27/2019 for inpatient therapies to consist of PT, ST and OT at least three hours five days a week. Past admission physiatrist, therapy team and rehab RN have worked together to provide customized collaborative inpatient rehab.  He continued to have copious secretions via trach and follow up CXR negative for acute disease. He  completed one week course of Rocephin by 09/12.  IR was contacted at admission for placement of PEG tube due to severe dysphagia. Plavix was held for 5 days and PEG was placed on 9/15 by Dr. Myrle Sheng. ST has been following for dysphagia treatment and repeat MBS showed no improvement. He is to continue to be NPO and has been advanced to bolus tube feeds tid. Diabetes has been monitored with  ac/hs CBG checks and BS have improved with titration of Lantus up to 33 units.   Blood pressures were monitored on TID basis and have been controlled on Norvasc and Proscar. Tachycardia has resolved. His respiratory status has been stable and he tolerated downsize to CFS#4 with capping trials during the day. PCCM was contacted for input and recommended capping overnight with CPAP use prior to making decision on decannulation. As patient was able to tolerate this without difficulty, he was  decannualted by 09/22 am.  He is tolerating CPAP use at nights and has recognized the  importance of compliance with this as well as his medications after discharge. He continues to have productive cough and robitussin DM has been helpful to decrease in secretions. Anxiety and overall outlook have improved and Seroquel has been tapered to 100 mg at bedtime.    He continues to have difficulty voiding with retention. FLomax and urecholine added to help with voiding function without success and he is currently requiring I/O caths every 4-5 hours to keep volumes < 350 cc. His wife has been educated on caths and he is set to follow up with Dr. Matilde Sprang on 09/30 for further workup after discharge.  His activity tolerance, safety awareness and mobility have greatly improved. He has made gains during rehab stay but continues to be limited by occasional dizziness and ataxia. He has progressed to supervision level and will continue to receive follow up [place], [place], [place] and [place] by [facility]  after discharge   Rehab course: During patient's stay in rehab weekly team conferences were held to monitor patient's progress, set goals and discuss barriers to discharge. At admission, patient required mod assist with ADL tasks and with mobility. Speech was 80% intelligible and he required max cues to clear throat and for hard swallow. He required > 5 minutes to clear oral/tracheal secretions and could tolerate PMSV for 40 minutes with short breaks. He  has had improvement in activity tolerance, balance, postural control as well as ability to compensate for deficits. He is able to complete ADL tasks with supervision to min assist.  He requires CGA for transfers with CGA and to ambulate 160' with RW. He is able to climb 4 stairs with min assist.   He continues to exhibit severe dysphagia and remains NPO. Cognition has improved and he requires supervision for recall, awareness of errors and for complex problem solving. Family  education was completed regarding all aspects of care and safety.    Disposition:  Home  Diet: NPO.  Special Instructions: 1. Monitor blood sugars before bolus tube feeds and at bedtime.  2. Continue tube feeds tid with water flushes qid.  3. In and out cath every 4-5 hours and cath to keep volumes < 350 cc.   Allergies as of 07/17/2019   No Known Allergies     Medication List    STOP taking these medications   acetaminophen 160 MG/5ML solution Commonly known as: TYLENOL Replaced by: acetaminophen 325 MG tablet   bisacodyl 10 MG suppository Commonly known as: DULCOLAX   cefTRIAXone 2 g in sodium chloride 0.9 % 100 mL   docusate 50 MG/5ML liquid Commonly known as: COLACE   enoxaparin 80 MG/0.8ML injection Commonly known as: LOVENOX   insulin aspart 100 UNIT/ML injection Commonly known as: novoLOG   insulin glargine 100 UNIT/ML injection Commonly known as: LANTUS Replaced by: Insulin Glargine 100 UNIT/ML Solostar Pen   scopolamine 1 MG/3DAYS Commonly known as: TRANSDERM-SCOP   senna-docusate  8.6-50 MG tablet Commonly known as: Senokot-S     TAKE these medications   acetaminophen 325 MG tablet Commonly known as: TYLENOL Place 1-2 tablets (325-650 mg total) into feeding tube every 4 (four) hours as needed for mild pain. Replaces: acetaminophen 160 MG/5ML solution   amLODipine 10 MG tablet Commonly known as: NORVASC Place 1 tablet (10 mg total) into feeding tube daily. Notes to patient: For blood pressure   aspirin 81 MG chewable tablet Place 1 tablet (81 mg total) into feeding tube daily. Notes to patient: Blood thinner   atorvastatin 40 MG tablet Commonly known as: LIPITOR Place 1 tablet (40 mg total) into feeding tube daily at 6 PM.   bethanechol 25 MG tablet Commonly known as: URECHOLINE Place 1 tablet (25 mg total) into feeding tube 4 (four) times daily. Notes to patient: For bladder   blood glucose meter kit and supplies Kit Dispense based on  patient and insurance preference. Use up to four times daily as directed. (FOR ICD-9 250.00, 250.01).   budesonide 0.25 MG/2ML nebulizer solution Commonly known as: PULMICORT Take 2 mLs (0.25 mg total) by nebulization 2 (two) times daily.   citalopram 10 MG tablet Commonly known as: CELEXA Place 1 tablet (10 mg total) into feeding tube daily. Start taking on: July 18, 2019 Notes to patient: For mood   clopidogrel 75 MG tablet Commonly known as: PLAVIX Place 1 tablet (75 mg total) into feeding tube daily. Notes to patient: Blood thinner   famotidine 40 MG/5ML suspension Commonly known as: PEPCID Place 2.5 mLs (20 mg total) into feeding tube 2 (two) times daily.   feeding supplement (OSMOLITE 1.5 CAL) Liqd Place 474 mLs into feeding tube 3 (three) times daily with meals. What changed:   how much to take  when to take this   feeding supplement (PRO-STAT SUGAR FREE 64) Liqd Place 30 mLs into feeding tube 2 (two) times daily. What changed: when to take this   finasteride 5 MG tablet Commonly known as: PROSCAR Put in tube What changed:   how much to take  how to take this  when to take this  additional instructions Notes to patient: For prostate/bladder   free water Soln Place 200 mLs into feeding tube 4 (four) times daily -  with meals and at bedtime. Use filtered or bottled water (not distilled water)   glycopyrrolate 1 MG tablet Commonly known as: ROBINUL Place 2 tablets (2 mg total) into feeding tube 2 (two) times daily. Notes to patient: To dry secretions   guaiFENesin-dextromethorphan 100-10 MG/5ML syrup Commonly known as: ROBITUSSIN DM Place 15 mLs into feeding tube every 6 (six) hours. Notes to patient: For cough/secretions.    Insulin Glargine 100 UNIT/ML Solostar Pen Commonly known as: LANTUS Inject 33 Units into the skin daily. Replaces: insulin glargine 100 UNIT/ML injection   mouth rinse Liqd solution 15 mLs by Mouth Rinse route 5 (five)  times daily. Notes to patient: Brush your teeth and use alcohol free mouth wash.    Pen Needles 32G X 6 MM Misc 1 application by Does not apply route daily.   QUEtiapine 100 MG tablet Commonly known as: SEROQUEL Place 1 tablet (100 mg total) into feeding tube at bedtime. What changed: Another medication with the same name was removed. Continue taking this medication, and follow the directions you see here.   tamsulosin 0.4 MG Caps capsule Commonly known as: FLOMAX Open capsule and administer medication via PEG tube      Follow-up Information  Bjorn Loser, MD Follow up on 07/19/2019.   Specialty: Urology Why: be there at 10:30 am for  Contact information: [address] [place] [address] [phone]        [facility]. Call.   Why: for follow up appointment if you have not heard from them by Wednesday.  Contact information: [address]     [address] [place] [address] [phone]       Pleas Koch, NP Follow up on 08/02/2019.   Specialty: Internal Medicine Why: Be there at 11:15 for 11:40 am appointment.  Contact information: [address] [place] [address] [phone]        Courtney Heys, MD Follow up.   Specialty: Physical Medicine and Rehabilitation Why: office will call you with follow up appointment Contact information: [address] [place] [address] [phone]           Signed: Bary Leriche 07/17/2019, 4:22 PM

## 2019-07-17 NOTE — Progress Notes (Signed)
Continues to refuse laxative/supp. For bowels. "I'll worry about that when I go home."  Explained importance of regular BM's. LBM 09/21. ? Schedule liquid senna via PEG tube.  At 2335, up to void, bladder scan=141cc's. Educated patient again on changing positions slowly. Denies dizziness thus far this shift. Crusty drainage around peg, cleaned and applied split gauze dressing. BLE edema. Denies pain. Compliant with wearing CPAP at night. Reports having BP machine at home and has ordered o2 sat monitor. Michael Bright A

## 2019-07-17 NOTE — Progress Notes (Signed)
Patient discharged home with spouse, no s/s of distress noted. All belongings sent with patient, no complications noted at this time.  Audie Clear, LPN

## 2019-07-19 ENCOUNTER — Emergency Department (HOSPITAL_COMMUNITY): Payer: BC Managed Care – PPO

## 2019-07-19 ENCOUNTER — Telehealth: Payer: Self-pay

## 2019-07-19 ENCOUNTER — Telehealth (HOSPITAL_COMMUNITY): Payer: Self-pay

## 2019-07-19 ENCOUNTER — Inpatient Hospital Stay (HOSPITAL_COMMUNITY)
Admission: EM | Admit: 2019-07-19 | Discharge: 2019-07-26 | DRG: 871 | Disposition: A | Discharge status: Rehabilitation Facility | Payer: BC Managed Care – PPO | Attending: Internal Medicine | Admitting: Internal Medicine

## 2019-07-19 ENCOUNTER — Other Ambulatory Visit: Payer: Self-pay

## 2019-07-19 DIAGNOSIS — E872 Acidosis, unspecified: Secondary | ICD-10-CM

## 2019-07-19 DIAGNOSIS — Z7289 Other problems related to lifestyle: Secondary | ICD-10-CM

## 2019-07-19 DIAGNOSIS — N390 Urinary tract infection, site not specified: Secondary | ICD-10-CM

## 2019-07-19 DIAGNOSIS — E089 Diabetes mellitus due to underlying condition without complications: Secondary | ICD-10-CM | POA: Diagnosis present

## 2019-07-19 DIAGNOSIS — R6521 Severe sepsis with septic shock: Secondary | ICD-10-CM | POA: Diagnosis present

## 2019-07-19 DIAGNOSIS — G4733 Obstructive sleep apnea (adult) (pediatric): Secondary | ICD-10-CM | POA: Diagnosis present

## 2019-07-19 DIAGNOSIS — E1122 Type 2 diabetes mellitus with diabetic chronic kidney disease: Secondary | ICD-10-CM | POA: Diagnosis present

## 2019-07-19 DIAGNOSIS — A4159 Other Gram-negative sepsis: Secondary | ICD-10-CM | POA: Diagnosis not present

## 2019-07-19 DIAGNOSIS — Z7951 Long term (current) use of inhaled steroids: Secondary | ICD-10-CM

## 2019-07-19 DIAGNOSIS — Z9049 Acquired absence of other specified parts of digestive tract: Secondary | ICD-10-CM

## 2019-07-19 DIAGNOSIS — N179 Acute kidney failure, unspecified: Secondary | ICD-10-CM | POA: Diagnosis present

## 2019-07-19 DIAGNOSIS — Z794 Long term (current) use of insulin: Secondary | ICD-10-CM

## 2019-07-19 DIAGNOSIS — J189 Pneumonia, unspecified organism: Secondary | ICD-10-CM

## 2019-07-19 DIAGNOSIS — R579 Shock, unspecified: Secondary | ICD-10-CM

## 2019-07-19 DIAGNOSIS — R6520 Severe sepsis without septic shock: Secondary | ICD-10-CM

## 2019-07-19 DIAGNOSIS — E7849 Other hyperlipidemia: Secondary | ICD-10-CM | POA: Diagnosis present

## 2019-07-19 DIAGNOSIS — J9601 Acute respiratory failure with hypoxia: Secondary | ICD-10-CM

## 2019-07-19 DIAGNOSIS — Z6841 Body Mass Index (BMI) 40.0 and over, adult: Secondary | ICD-10-CM

## 2019-07-19 DIAGNOSIS — E876 Hypokalemia: Secondary | ICD-10-CM | POA: Diagnosis present

## 2019-07-19 DIAGNOSIS — R55 Syncope and collapse: Secondary | ICD-10-CM

## 2019-07-19 DIAGNOSIS — B348 Other viral infections of unspecified site: Secondary | ICD-10-CM | POA: Diagnosis present

## 2019-07-19 DIAGNOSIS — I129 Hypertensive chronic kidney disease with stage 1 through stage 4 chronic kidney disease, or unspecified chronic kidney disease: Secondary | ICD-10-CM | POA: Diagnosis present

## 2019-07-19 DIAGNOSIS — Z7902 Long term (current) use of antithrombotics/antiplatelets: Secondary | ICD-10-CM

## 2019-07-19 DIAGNOSIS — A419 Sepsis, unspecified organism: Secondary | ICD-10-CM

## 2019-07-19 DIAGNOSIS — Z931 Gastrostomy status: Secondary | ICD-10-CM

## 2019-07-19 DIAGNOSIS — N183 Chronic kidney disease, stage 3 unspecified: Secondary | ICD-10-CM | POA: Diagnosis present

## 2019-07-19 DIAGNOSIS — B961 Klebsiella pneumoniae [K. pneumoniae] as the cause of diseases classified elsewhere: Secondary | ICD-10-CM | POA: Diagnosis present

## 2019-07-19 DIAGNOSIS — N12 Tubulo-interstitial nephritis, not specified as acute or chronic: Secondary | ICD-10-CM

## 2019-07-19 DIAGNOSIS — Z20828 Contact with and (suspected) exposure to other viral communicable diseases: Secondary | ICD-10-CM | POA: Diagnosis present

## 2019-07-19 DIAGNOSIS — Z452 Encounter for adjustment and management of vascular access device: Secondary | ICD-10-CM

## 2019-07-19 DIAGNOSIS — Z7982 Long term (current) use of aspirin: Secondary | ICD-10-CM

## 2019-07-19 DIAGNOSIS — R7401 Elevation of levels of liver transaminase levels: Secondary | ICD-10-CM | POA: Diagnosis present

## 2019-07-19 DIAGNOSIS — Z79899 Other long term (current) drug therapy: Secondary | ICD-10-CM

## 2019-07-19 DIAGNOSIS — R34 Anuria and oliguria: Secondary | ICD-10-CM | POA: Diagnosis present

## 2019-07-19 DIAGNOSIS — J690 Pneumonitis due to inhalation of food and vomit: Secondary | ICD-10-CM | POA: Diagnosis present

## 2019-07-19 DIAGNOSIS — I69391 Dysphagia following cerebral infarction: Secondary | ICD-10-CM

## 2019-07-19 LAB — URINALYSIS, ROUTINE W REFLEX MICROSCOPIC
Bilirubin Urine: NEGATIVE
Glucose, UA: NEGATIVE mg/dL
Ketones, ur: NEGATIVE mg/dL
Nitrite: NEGATIVE
Protein, ur: 100 mg/dL — AB
RBC / HPF: >50 RBC/hpf — ABNORMAL HIGH (ref 0–5)
Specific Gravity, Urine: 1.012 (ref 1.005–1.030)
WBC, UA: >50 WBC/hpf — ABNORMAL HIGH (ref 0–5)
pH: 5 (ref 5.0–8.0)

## 2019-07-19 LAB — CBC WITH DIFFERENTIAL/PLATELET
Abs Immature Granulocytes: 0.08 10*3/uL — ABNORMAL HIGH (ref 0.00–0.07)
Basophils Absolute: 0 10*3/uL (ref 0.0–0.1)
Basophils Relative: 0 %
Eosinophils Absolute: 0 10*3/uL (ref 0.0–0.5)
Eosinophils Relative: 0 %
HCT: 43.2 % (ref 39.0–52.0)
Hemoglobin: 13.7 g/dL (ref 13.0–17.0)
Immature Granulocytes: 2 %
Lymphocytes Relative: 7 %
Lymphs Abs: 0.4 10*3/uL — ABNORMAL LOW (ref 0.7–4.0)
MCH: 30.3 pg (ref 26.0–34.0)
MCHC: 31.7 g/dL (ref 30.0–36.0)
MCV: 95.6 fL (ref 80.0–100.0)
Monocytes Absolute: 0 10*3/uL — ABNORMAL LOW (ref 0.1–1.0)
Monocytes Relative: 0 %
Neutro Abs: 4.8 10*3/uL (ref 1.7–7.7)
Neutrophils Relative %: 91 %
Platelets: 209 10*3/uL (ref 150–400)
RBC: 4.52 MIL/uL (ref 4.22–5.81)
RDW: 12.2 % (ref 11.5–15.5)
WBC: 5 10*3/uL (ref 4.0–10.5)
nRBC: 0 % (ref 0.0–0.2)

## 2019-07-19 LAB — COMPREHENSIVE METABOLIC PANEL
ALT: 69 U/L — ABNORMAL HIGH (ref 0–44)
AST: 98 U/L — ABNORMAL HIGH (ref 15–41)
Albumin: 3.2 g/dL — ABNORMAL LOW (ref 3.5–5.0)
Alkaline Phosphatase: 172 U/L — ABNORMAL HIGH (ref 38–126)
Anion gap: 16 — ABNORMAL HIGH (ref 5–15)
BUN: 22 mg/dL (ref 8–23)
CO2: 20 mmol/L — ABNORMAL LOW (ref 22–32)
Calcium: 8.8 mg/dL — ABNORMAL LOW (ref 8.9–10.3)
Chloride: 100 mmol/L (ref 98–111)
Creatinine, Ser: 1.43 mg/dL — ABNORMAL HIGH (ref 0.61–1.24)
GFR calc Af Amer: >60 mL/min (ref >60)
GFR calc non Af Amer: 52 mL/min — ABNORMAL LOW (ref >60)
Glucose, Bld: 173 mg/dL — ABNORMAL HIGH (ref 70–99)
Potassium: 3.4 mmol/L — ABNORMAL LOW (ref 3.5–5.1)
Sodium: 136 mmol/L (ref 135–145)
Total Bilirubin: 1.1 mg/dL (ref 0.3–1.2)
Total Protein: 7.7 g/dL (ref 6.5–8.1)

## 2019-07-19 LAB — PROTIME-INR
INR: 1.5 — ABNORMAL HIGH (ref 0.8–1.2)
Prothrombin Time: 17.6 s — ABNORMAL HIGH (ref 11.4–15.2)

## 2019-07-19 LAB — LACTIC ACID, PLASMA: Lactic Acid, Venous: 7.1 mmol/L (ref 0.5–1.9)

## 2019-07-19 LAB — SARS CORONAVIRUS 2 BY RT PCR (HOSPITAL ORDER, PERFORMED IN ~~LOC~~ HOSPITAL LAB): SARS Coronavirus 2: NEGATIVE

## 2019-07-19 LAB — APTT: aPTT: 36 s (ref 24–36)

## 2019-07-19 MED ORDER — SODIUM CHLORIDE 0.9 % IV BOLUS
1000.0000 mL | Freq: Once | INTRAVENOUS | Status: AC
  Administered 2019-07-19: 1000 mL via INTRAVENOUS

## 2019-07-19 MED ORDER — VANCOMYCIN HCL IN DEXTROSE 1-5 GM/200ML-% IV SOLN
1000.0000 mg | Freq: Once | INTRAVENOUS | Status: DC
  Filled 2019-07-19: qty 200

## 2019-07-19 MED ORDER — VANCOMYCIN HCL 10 G IV SOLR
2500.0000 mg | Freq: Once | INTRAVENOUS | Status: AC
  Administered 2019-07-19: 2500 mg via INTRAVENOUS
  Filled 2019-07-19: qty 2500

## 2019-07-19 MED ORDER — VANCOMYCIN HCL 10 G IV SOLR: 1750.0000 mg | INTRAVENOUS | Status: DC

## 2019-07-19 MED ORDER — SODIUM CHLORIDE 0.9 % IV SOLN
2.0000 g | Freq: Once | INTRAVENOUS | Status: AC
  Administered 2019-07-19: 21:00:00 2 g via INTRAVENOUS
  Filled 2019-07-19: qty 2

## 2019-07-19 MED ORDER — VANCOMYCIN HCL 10 G IV SOLR: 1750.0000 mg | Freq: Two times a day (BID) | INTRAVENOUS | Status: DC

## 2019-07-19 MED ORDER — SODIUM CHLORIDE 0.9 % IV SOLN
2.0000 g | Freq: Three times a day (TID) | INTRAVENOUS | Status: DC
  Administered 2019-07-20 (×3): 2 g via INTRAVENOUS
  Filled 2019-07-19 (×3): qty 2

## 2019-07-19 NOTE — ED Notes (Signed)
MD Schlossman aware of hypotension, at bed side, will give fluids and continue to monitor.  Pt mentating well.

## 2019-07-19 NOTE — Telephone Encounter (Signed)
  [place]  Patient Name: Michael Bright DOB: Apr 12, 1956 Appointment Date and Time: 07-31-2019 / 10:40am With: Dr. Dagoberto Ligas  Questions for our staff to ask patients on Transitional care 48 hour phone call:   1. Are you/is patient experiencing any problems since coming home? NO  Are there any questions regarding any aspect of care? NO  2. Are there any questions regarding medications administration/dosing? NO  Are meds being taken as prescribed? YES  Patient should review meds with caller to confirm   3. Have there been any falls? NO  4. Has Home Health been to the house and/or have they contacted you? NO  If not, have you tried to contact them? YES  Can we help you contact them? TOLD THEM TO CALL us IF THEY DO NOT HEAR FROM [facility] BY Friday TO CALL us BACK  5. Are bowels and bladder emptying properly? YES  Are there any unexpected incontinence issues? NO  If applicable, is patient following bowel/bladder programs? NA  6. Any fevers, problems with breathing, unexpected pain? NO  7. Are there any skin problems or new areas of breakdown? NO  8. Has the patient/family member arranged specialty MD follow up (ie cardiology/neurology/renal/surgical/etc)?   Can we help arrange? YES  9. Does the patient need any other services or support that we can help arrange? NO  10. Are caregivers following through as expected in assisting the patient? YES  11. Has the patient quit smoking, drinking alcohol, or using drugs as recommended? NA  [facility] [address] [phone]

## 2019-07-19 NOTE — ED Provider Notes (Signed)
[facility] EMERGENCY DEPARTMENT Provider Note   CSN: [id number] Arrival date & time: 07/19/19  2013     History   Chief Complaint Chief Complaint  Patient presents with  . Loss of Consciousness    HPI Huntington Leverich is a 63 y.o. male.     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  Patient reports he has had cough for one week since his tracheostomy was removed that has been persistent, seems productive but has difficulty raising phlegm.  Today, he developed chills, nausea and vomiting.  Denies urinary symptoms. Had normal BM today, denies constipation, diarrhea.  Felt like he had a fever but not sure. Took tylenol prior to arrival. Had generalized weakness and syncopal event while sitting in chair. No head trauma per wife.  Past Medical History:  Diagnosis Date  . Diabetes [facility])     Patient Active Problem List   Diagnosis Date Noted  . Acute blood loss anemia   . Dysphagia due to recent cerebral infarction   . Diabetes mellitus type 2 in obese ([place])   . Morbid obesity ([place])   . Status post insertion of percutaneous endoscopic gastrostomy (PEG) tube ([place])   . Stroke, small vessel ([place]) 06/27/2019  . Aspiration pneumonia ([place]) 06/14/2019  . Essential hypertension 06/14/2019  . Type 2 diabetes mellitus with hyperlipidemia ([place]) 06/14/2019  . Acute respiratory failure ([place])   . Acute CVA (cerebrovascular accident) ([place]) 05/30/2019  . 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 Dysphagia 05/30/2019  . Renal insufficiency 05/30/2019  . Diplopia   .  Weakness     Past Surgical History:  Procedure Laterality Date  . CHOLECYSTECTOMY    . IR GASTROSTOMY TUBE MOD SED  07/04/2019        Home Medications    Prior to Admission medications   Medication Sig Start Date End Date Taking? Authorizing Provider  acetaminophen (TYLENOL) 325 MG tablet Place 1-2 tablets (325-650 mg total) into feeding tube every 4 (four) hours as needed for mild pain. 07/17/19   Love, Ivan Anchors, PA-C  Amino Acids-Protein Hydrolys (FEEDING SUPPLEMENT, PRO-STAT SUGAR FREE 64,) LIQD Place 30 mLs into feeding tube 2 (two) times daily. 07/17/19   Love, Ivan Anchors, PA-C  amLODipine (NORVASC) 10 MG tablet Place 1 tablet (10 mg total) into feeding tube daily. 07/17/19   Love, Ivan Anchors, PA-C  aspirin 81 MG chewable tablet Place 1 tablet (81 mg total) into feeding tube daily. 06/28/19   Debbe Odea, MD  atorvastatin (LIPITOR) 40 MG tablet Place 1 tablet (40 mg total) into feeding tube daily at 6 PM. 07/17/19   Love, Ivan Anchors, PA-C  bethanechol (URECHOLINE) 25 MG tablet Place 1 tablet (25 mg total) into feeding tube 4 (four) times daily. 07/17/19   Love, Ivan Anchors, PA-C  blood glucose meter kit and supplies KIT Dispense based on patient and insurance preference. Use up to four times daily as directed. (FOR ICD-9 250.00, 250.01). 07/17/19   Love, Ivan Anchors, PA-C  budesonide (PULMICORT) 0.25 MG/2ML nebulizer solution Take 2 mLs (0.25 mg total) by nebulization 2 (two) times daily. 07/17/19   Reesa Chew  S, PA-C  citalopram (CELEXA) 10 MG tablet Place 1 tablet (10 mg total) into feeding tube daily. 07/18/19   Love, Ivan Anchors, PA-C  clopidogrel (PLAVIX) 75 MG tablet Place 1 tablet (75 mg total) into feeding tube daily. 07/17/19   Love, Ivan Anchors, PA-C  famotidine (PEPCID) 40 MG/5ML suspension Place 2.5 mLs (20 mg total) into feeding tube 2 (two) times daily. 07/17/19   Love, Ivan Anchors, PA-C  finasteride (PROSCAR) 5 MG tablet Put in tube 07/17/19   Love, Ivan Anchors, PA-C  glycopyrrolate (ROBINUL) 1 MG  tablet Place 2 tablets (2 mg total) into feeding tube 2 (two) times daily. 07/17/19   Love, Ivan Anchors, PA-C  guaiFENesin-dextromethorphan (ROBITUSSIN DM) 100-10 MG/5ML syrup Place 15 mLs into feeding tube every 6 (six) hours. 07/17/19   Love, Ivan Anchors, PA-C  Insulin Glargine (LANTUS) 100 UNIT/ML Solostar Pen Inject 33 Units into the skin daily. 07/17/19   Love, Ivan Anchors, PA-C  Insulin Pen Needle (PEN NEEDLES) 32G X 6 MM MISC 1 application by Does not apply route daily. 07/17/19   Love, Ivan Anchors, PA-C  mouth rinse LIQD solution 15 mLs by Mouth Rinse route 5 (five) times daily. 07/17/19   Love, Ivan Anchors, PA-C  Nutritional Supplements (FEEDING SUPPLEMENT, OSMOLITE 1.5 CAL,) LIQD Place 474 mLs into feeding tube 3 (three) times daily with meals. 07/17/19   Love, Ivan Anchors, PA-C  QUEtiapine (SEROQUEL) 100 MG tablet Place 1 tablet (100 mg total) into feeding tube at bedtime. 07/17/19   Love, Ivan Anchors, PA-C  tamsulosin (FLOMAX) 0.4 MG CAPS capsule Open capsule and administer medication via PEG tube 07/17/19   Love, Ivan Anchors, PA-C  Water For Irrigation, Sterile (FREE WATER) SOLN Place 200 mLs into feeding tube 4 (four) times daily -  with meals and at bedtime. Use filtered or bottled water (not distilled water) 07/17/19   Love, Ivan Anchors, PA-C    Family History No family history on file.  Social History Social History   Tobacco Use  . Smoking status: Never Smoker  . Smokeless tobacco: Never Used  Substance Use Topics  . Alcohol use: Yes  . Drug use: No     Allergies   Patient has no known allergies.   Review of Systems Review of Systems  Constitutional: Positive for chills, fatigue and fever.  HENT: Negative for congestion and sore throat.   Eyes: Negative for visual disturbance.  Respiratory: Positive for cough. Negative for shortness of breath.   Cardiovascular: Negative for chest pain.  Gastrointestinal: Positive for nausea and vomiting. Negative for abdominal pain, constipation and diarrhea.   Genitourinary: Negative for difficulty urinating and dysuria.  Musculoskeletal: Negative for back pain and neck stiffness.  Skin: Negative for rash.  Neurological: Negative for syncope and headaches.     Physical Exam Updated Vital Signs BP (!) 108/58   Pulse (!) 134   Temp (!) 102.3 F (39.1 C) (Rectal)   Resp (!) 26   Ht _0  (1.778 m)   Wt (!) 141 kg   SpO2 93%   BMI 44.60 kg/m   Physical Exam Vitals signs and nursing note reviewed.  Constitutional:      General: He is not in acute distress.    Appearance: He is well-developed. He is ill-appearing. He is not diaphoretic.  HENT:     Head: Normocephalic and atraumatic.  Eyes:     Conjunctiva/sclera: Conjunctivae normal.  Neck:     Musculoskeletal: Normal range of motion.  Cardiovascular:  Rate and Rhythm: Regular rhythm. Tachycardia present.     Heart sounds: Normal heart sounds. No murmur. No friction rub. No gallop.   Pulmonary:     Effort: Pulmonary effort is normal. No respiratory distress.     Breath sounds: Rhonchi (diffuse) present. No wheezing or rales.     Comments: Frequent cough Abdominal:     General: There is no distension.     Palpations: Abdomen is soft.     Tenderness: There is no abdominal tenderness. There is no guarding.     Comments: PEG tube in place  Skin:    General: Skin is warm and dry.  Neurological:     Mental Status: He is alert and oriented to person, place, and time.      ED Treatments / Results  Labs (all labs ordered are listed, but only abnormal results are displayed) Labs Reviewed  LACTIC ACID, PLASMA - Abnormal; Notable for the following components:      Result Value   Lactic Acid, Venous 7.1 (*)    All other components within normal limits  LACTIC ACID, PLASMA - Abnormal; Notable for the following components:   Lactic Acid, Venous 4.6 (*)    All other components within normal limits  COMPREHENSIVE METABOLIC PANEL - Abnormal; Notable for the following components:    Potassium 3.4 (*)    CO2 20 (*)    Glucose, Bld 173 (*)    Creatinine, Ser 1.43 (*)    Calcium 8.8 (*)    Albumin 3.2 (*)    AST 98 (*)    ALT 69 (*)    Alkaline Phosphatase 172 (*)    GFR calc non Af Amer 52 (*)    Anion gap 16 (*)    All other components within normal limits  CBC WITH DIFFERENTIAL/PLATELET - Abnormal; Notable for the following components:   Lymphs Abs 0.4 (*)    Monocytes Absolute 0.0 (*)    Abs Immature Granulocytes 0.08 (*)    All other components within normal limits  PROTIME-INR - Abnormal; Notable for the following components:   Prothrombin Time 17.6 (*)    INR 1.5 (*)    All other components within normal limits  URINALYSIS, ROUTINE W REFLEX MICROSCOPIC - Abnormal; Notable for the following components:   APPearance CLOUDY (*)    Hgb urine dipstick LARGE (*)    Protein, ur 100 (*)    Leukocytes,Ua LARGE (*)    RBC / HPF >50 (*)    WBC, UA >50 (*)    Bacteria, UA MANY (*)    All other components within normal limits  SARS CORONAVIRUS 2 (HOSPITAL ORDER, [facility])  CULTURE, BLOOD (ROUTINE X 2)  CULTURE, BLOOD (ROUTINE X 2)  URINE CULTURE  APTT    EKG EKG Interpretation  Date/Time:  Wednesday July 19 2019 20:25:44 EDT Ventricular Rate:  155 PR Interval:    QRS Duration: 85 QT Interval:  258 QTC Calculation: 415 R Axis:   60 Text Interpretation:  Sinus tachycardia Ventricular premature complex Since prior ECG, rate has increased Confirmed by Gareth Morgan [phone]) on 07/20/2019 12:38:34 AM   Radiology Dg Chest Portable 1 View  Result Date: 07/20/2019 CLINICAL DATA:  Hypoxia, syncopal episode witnessed EXAM: PORTABLE CHEST 1 VIEW COMPARISON:  Radiograph 07/14/2019 FINDINGS: Increasing patchy and streaky bibasilar areas of opacity as well as more focal opacity in the retrocardiac space. No pneumothorax or visible effusion. No convincing features of edema. Prominence of the cardiac  silhouette is likely related  to the portable technique. No acute osseous or soft tissue abnormality. IMPRESSION: Patchy and streaky bibasilar areas of opacity as well as more focal opacity in the retrocardiac space. Findings could reflect sequela of aspiration in the appropriate setting versus pneumonia. Electronically Signed   By: Lovena Le M.D.   On: 07/20/2019 00:59   Dg Chest Port 1 View  Result Date: 07/19/2019 CLINICAL DATA:  Recent syncopal episode and cough EXAM: PORTABLE CHEST 1 VIEW COMPARISON:  07/14/2019 FINDINGS: Cardiac shadows within normal limits. Mild aortic calcifications are again seen. The lungs are well aerated bilaterally with mild bibasilar atelectasis stable from the previous exam. No new focal infiltrate is seen. No bony abnormality is noted. IMPRESSION: Stable bibasilar atelectasis. Electronically Signed   By: Inez Catalina M.D.   On: 07/19/2019 21:54    Procedures .Critical Care Performed by: Gareth Morgan, MD Authorized by: Gareth Morgan, MD   Critical care provider statement:    Critical care time (minutes):  70   Critical care was necessary to treat or prevent imminent or life-threatening deterioration of the following conditions:  Sepsis   Critical care was time spent personally by me on the following activities:  Discussions with consultants, evaluation of patient's response to treatment, examination of patient, ordering and performing treatments and interventions, ordering and review of laboratory studies, ordering and review of radiographic studies, pulse oximetry, re-evaluation of patient's condition, obtaining history from patient or surrogate and review of old charts   (including critical care time)  Medications Ordered in ED Medications  ceFEPIme (MAXIPIME) 2 g in sodium chloride 0.9 % 100 mL IVPB (has no administration in time range)  vancomycin (VANCOCIN) 1,750 mg in sodium chloride 0.9 % 500 mL IVPB (has no administration in time range)  acetaminophen (TYLENOL) tablet 1,000  mg (has no administration in time range)  metroNIDAZOLE (FLAGYL) IVPB 500 mg (has no administration in time range)  sodium chloride 0.9 % bolus 500 mL (has no administration in time range)  sodium chloride 0.9 % bolus 1,000 mL (0 mLs Intravenous Stopped 07/19/19 2217)  sodium chloride 0.9 % bolus 1,000 mL (0 mLs Intravenous Stopped 07/19/19 2346)  ceFEPIme (MAXIPIME) 2 g in sodium chloride 0.9 % 100 mL IVPB (0 g Intravenous Stopped 07/19/19 2216)  vancomycin (VANCOCIN) 2,500 mg in sodium chloride 0.9 % 500 mL IVPB (0 mg Intravenous Stopped 07/20/19 0044)  sodium chloride 0.9 % bolus 1,000 mL (0 mLs Intravenous Stopped 07/20/19 0044)  sodium chloride 0.9 % bolus 1,000 mL (0 mLs Intravenous Stopped 07/20/19 0044)     Initial Impression / Assessment and Plan / ED Course  I have reviewed the triage vital signs and the nursing notes.  Pertinent labs & imaging results that were available during my care of the patient were reviewed by me and considered in my medical decision making (see chart for details).        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  Blood pressures 70s on arrival to ED with sinus tachycardia, O2 saturations maintained on 5L pNC.  Called Code Sepsis and gave abx for possible HCAP or UTI.  Concern also for possible COVID19 on arrival. Blood pressures initially  improved with fluid, and did not order 30cc/kg given concern for possible COVID, pt with hypoxia and initial improvement.    Blood pressures subsequently decreased, he was found to have a lactic acid of 7, urinary source for infection and he was given additional fluids to  total 4.5L.  After 4L, his blood pressures have improved again to 235 systolic, however saturations which were 100% are now in low 90s.  COVID test negative. Discussed plan with wife to discuss with PCCM and continue to monitor his VS.    PCCM consulted for evaluation.   He has significant coughing/mucus, and is not in distress but given hypoxia and possible fluid contributing, will place on bipap.   Ordered repeat XR which now shows concern for aspiration/pneumonia.  Added on flagyl.  Pt awaiting PCCM consultation for admission at time of sign out.  Final Clinical Impressions(s) / ED Diagnoses   Final diagnoses:  Syncope, unspecified syncope type  Acute respiratory failure with hypoxia ([place])  Sepsis with acute renal failure without septic shock, due to unspecified organism, unspecified acute renal failure type ([place])  Urinary tract infection without hematuria, site unspecified  Lactic acidosis    ED Discharge Orders    None       Gareth Morgan, MD 07/20/19 0117

## 2019-07-19 NOTE — Progress Notes (Addendum)
Pharmacy Antibiotic Note  Michael Bright is a 63 y.o. male admitted on 07/19/2019 s/p syncopal episode.  Patient was recently discharge after treatment for acute CVA.  Pharmacy has been consulted for vancomycin and cefepime dosing for HCAP.  He was recently on Keflex, then Unasyn and then Rocephin for H.flu PNA.  SCr 0.78 >> 1.43, nCrCL 55 ml/min, afebrile, WBC WNL.  Plan: Vanc 2500mg  IV x 1, then 1750mg  IV Q24H for AUC 493 using SCr 1.43 Cefepime 2gm IV Q8H Monitor renal fxn, clinical progress, vanc AUC as indicated    Temp (24hrs), Avg:99.1 F (37.3 C), Min:99.1 F (37.3 C), Max:99.1 F (37.3 C)  Recent Labs  Lab 07/17/19 0532  WBC 7.2  CREATININE 0.78    Estimated Creatinine Clearance: 141.5 mL/min (by C-G formula based on SCr of 0.78 mg/dL).    No Known Allergies  Vanc 9/30 >> Cefepime 9/30 >>  9/30 covid -  9/30 UCx -  9/30 BCx -   Previous admit: 9/6 Keflex broadened to Unasyn >> 9/8 9/8 CTX >> 9/12  9/5 TA - H.flu, beta lactamase neg 9/10 UC - negative  Jerzee Jerome D. Mina Marble, PharmD, BCPS, [place] 07/19/2019, 9:18 PM

## 2019-07-19 NOTE — ED Notes (Signed)
CALL WIFE WHEN SHE CAN VISIT  [phone]

## 2019-07-19 NOTE — ED Notes (Signed)
called CCM to Dr Billy Fischer

## 2019-07-19 NOTE — ED Triage Notes (Signed)
Pt had witnessed syncopal episode at home, suspected aspiration on feeding tube. Residual weakness/twitch from former stroke. 97% on 5L

## 2019-07-19 NOTE — Progress Notes (Signed)
Alerted by [facility] that they are unable to provide the nursing needs for Michael Bright at this time and delay could be 2 weeks.  I have cancelled entire referral with [facility] and referral placed with [facility].  Pt's wife has been contacted and is aware of the agency change.  Kedar Sedano, LCSW

## 2019-07-19 NOTE — ED Notes (Signed)
MD Schlossman aware that this RN unable to get 2nd set of cultures. States it is okay to start antibiotics.

## 2019-07-20 ENCOUNTER — Emergency Department (HOSPITAL_COMMUNITY): Payer: BC Managed Care – PPO

## 2019-07-20 ENCOUNTER — Inpatient Hospital Stay (HOSPITAL_COMMUNITY): Payer: BC Managed Care – PPO

## 2019-07-20 DIAGNOSIS — R6521 Severe sepsis with septic shock: Secondary | ICD-10-CM | POA: Diagnosis present

## 2019-07-20 DIAGNOSIS — N183 Chronic kidney disease, stage 3 unspecified: Secondary | ICD-10-CM | POA: Diagnosis present

## 2019-07-20 DIAGNOSIS — A4159 Other Gram-negative sepsis: Secondary | ICD-10-CM | POA: Diagnosis present

## 2019-07-20 DIAGNOSIS — E7849 Other hyperlipidemia: Secondary | ICD-10-CM | POA: Diagnosis present

## 2019-07-20 DIAGNOSIS — E1322 Other specified diabetes mellitus with diabetic chronic kidney disease: Secondary | ICD-10-CM | POA: Diagnosis not present

## 2019-07-20 DIAGNOSIS — J189 Pneumonia, unspecified organism: Secondary | ICD-10-CM | POA: Diagnosis not present

## 2019-07-20 DIAGNOSIS — Z7951 Long term (current) use of inhaled steroids: Secondary | ICD-10-CM | POA: Diagnosis not present

## 2019-07-20 DIAGNOSIS — J9601 Acute respiratory failure with hypoxia: Secondary | ICD-10-CM

## 2019-07-20 DIAGNOSIS — Z79899 Other long term (current) drug therapy: Secondary | ICD-10-CM | POA: Diagnosis not present

## 2019-07-20 DIAGNOSIS — I10 Essential (primary) hypertension: Secondary | ICD-10-CM | POA: Diagnosis not present

## 2019-07-20 DIAGNOSIS — R339 Retention of urine, unspecified: Secondary | ICD-10-CM | POA: Diagnosis not present

## 2019-07-20 DIAGNOSIS — E872 Acidosis: Secondary | ICD-10-CM | POA: Diagnosis present

## 2019-07-20 DIAGNOSIS — B348 Other viral infections of unspecified site: Secondary | ICD-10-CM | POA: Diagnosis present

## 2019-07-20 DIAGNOSIS — R7401 Elevation of levels of liver transaminase levels: Secondary | ICD-10-CM | POA: Diagnosis present

## 2019-07-20 DIAGNOSIS — D638 Anemia in other chronic diseases classified elsewhere: Secondary | ICD-10-CM | POA: Diagnosis not present

## 2019-07-20 DIAGNOSIS — E1365 Other specified diabetes mellitus with hyperglycemia: Secondary | ICD-10-CM | POA: Diagnosis not present

## 2019-07-20 DIAGNOSIS — D72829 Elevated white blood cell count, unspecified: Secondary | ICD-10-CM | POA: Diagnosis not present

## 2019-07-20 DIAGNOSIS — R7309 Other abnormal glucose: Secondary | ICD-10-CM | POA: Diagnosis not present

## 2019-07-20 DIAGNOSIS — A419 Sepsis, unspecified organism: Secondary | ICD-10-CM | POA: Insufficient documentation

## 2019-07-20 DIAGNOSIS — J690 Pneumonitis due to inhalation of food and vomit: Secondary | ICD-10-CM | POA: Diagnosis present

## 2019-07-20 DIAGNOSIS — Z794 Long term (current) use of insulin: Secondary | ICD-10-CM | POA: Diagnosis not present

## 2019-07-20 DIAGNOSIS — N12 Tubulo-interstitial nephritis, not specified as acute or chronic: Secondary | ICD-10-CM | POA: Diagnosis present

## 2019-07-20 DIAGNOSIS — N179 Acute kidney failure, unspecified: Secondary | ICD-10-CM

## 2019-07-20 DIAGNOSIS — M75102 Unspecified rotator cuff tear or rupture of left shoulder, not specified as traumatic: Secondary | ICD-10-CM | POA: Diagnosis not present

## 2019-07-20 DIAGNOSIS — E089 Diabetes mellitus due to underlying condition without complications: Secondary | ICD-10-CM | POA: Diagnosis present

## 2019-07-20 DIAGNOSIS — I129 Hypertensive chronic kidney disease with stage 1 through stage 4 chronic kidney disease, or unspecified chronic kidney disease: Secondary | ICD-10-CM | POA: Diagnosis present

## 2019-07-20 DIAGNOSIS — I69391 Dysphagia following cerebral infarction: Secondary | ICD-10-CM | POA: Diagnosis not present

## 2019-07-20 DIAGNOSIS — R34 Anuria and oliguria: Secondary | ICD-10-CM | POA: Diagnosis present

## 2019-07-20 DIAGNOSIS — E1122 Type 2 diabetes mellitus with diabetic chronic kidney disease: Secondary | ICD-10-CM | POA: Diagnosis present

## 2019-07-20 DIAGNOSIS — Z7982 Long term (current) use of aspirin: Secondary | ICD-10-CM | POA: Diagnosis not present

## 2019-07-20 DIAGNOSIS — E1165 Type 2 diabetes mellitus with hyperglycemia: Secondary | ICD-10-CM | POA: Diagnosis not present

## 2019-07-20 DIAGNOSIS — Z6841 Body Mass Index (BMI) 40.0 and over, adult: Secondary | ICD-10-CM | POA: Diagnosis not present

## 2019-07-20 DIAGNOSIS — Z9049 Acquired absence of other specified parts of digestive tract: Secondary | ICD-10-CM | POA: Diagnosis not present

## 2019-07-20 DIAGNOSIS — N390 Urinary tract infection, site not specified: Secondary | ICD-10-CM | POA: Diagnosis not present

## 2019-07-20 DIAGNOSIS — I639 Cerebral infarction, unspecified: Secondary | ICD-10-CM | POA: Diagnosis not present

## 2019-07-20 DIAGNOSIS — Z20828 Contact with and (suspected) exposure to other viral communicable diseases: Secondary | ICD-10-CM | POA: Diagnosis present

## 2019-07-20 DIAGNOSIS — R5381 Other malaise: Secondary | ICD-10-CM | POA: Diagnosis not present

## 2019-07-20 DIAGNOSIS — R6520 Severe sepsis without septic shock: Secondary | ICD-10-CM

## 2019-07-20 DIAGNOSIS — E876 Hypokalemia: Secondary | ICD-10-CM | POA: Diagnosis present

## 2019-07-20 DIAGNOSIS — R0989 Other specified symptoms and signs involving the circulatory and respiratory systems: Secondary | ICD-10-CM | POA: Diagnosis not present

## 2019-07-20 DIAGNOSIS — B961 Klebsiella pneumoniae [K. pneumoniae] as the cause of diseases classified elsewhere: Secondary | ICD-10-CM | POA: Diagnosis present

## 2019-07-20 DIAGNOSIS — Z7902 Long term (current) use of antithrombotics/antiplatelets: Secondary | ICD-10-CM | POA: Diagnosis not present

## 2019-07-20 DIAGNOSIS — G4733 Obstructive sleep apnea (adult) (pediatric): Secondary | ICD-10-CM | POA: Diagnosis present

## 2019-07-20 LAB — CBC
HCT: 34.4 % — ABNORMAL LOW (ref 39.0–52.0)
Hemoglobin: 11.1 g/dL — ABNORMAL LOW (ref 13.0–17.0)
MCH: 30.7 pg (ref 26.0–34.0)
MCHC: 32.3 g/dL (ref 30.0–36.0)
MCV: 95 fL (ref 80.0–100.0)
Platelets: 207 10*3/uL (ref 150–400)
RBC: 3.62 MIL/uL — ABNORMAL LOW (ref 4.22–5.81)
RDW: 12.4 % (ref 11.5–15.5)
WBC: 14.6 10*3/uL — ABNORMAL HIGH (ref 4.0–10.5)
nRBC: 0 % (ref 0.0–0.2)

## 2019-07-20 LAB — BASIC METABOLIC PANEL
Anion gap: 14 (ref 5–15)
Anion gap: 12 (ref 5–15)
BUN: 25 mg/dL — ABNORMAL HIGH (ref 8–23)
BUN: 25 mg/dL — ABNORMAL HIGH (ref 8–23)
CO2: 18 mmol/L — ABNORMAL LOW (ref 22–32)
CO2: 20 mmol/L — ABNORMAL LOW (ref 22–32)
Calcium: 7.6 mg/dL — ABNORMAL LOW (ref 8.9–10.3)
Calcium: 7.8 mg/dL — ABNORMAL LOW (ref 8.9–10.3)
Chloride: 106 mmol/L (ref 98–111)
Chloride: 108 mmol/L (ref 98–111)
Creatinine, Ser: 1.45 mg/dL — ABNORMAL HIGH (ref 0.61–1.24)
Creatinine, Ser: 1.23 mg/dL (ref 0.61–1.24)
GFR calc Af Amer: 59 mL/min — ABNORMAL LOW (ref >60)
GFR calc Af Amer: >60 mL/min (ref >60)
GFR calc non Af Amer: 51 mL/min — ABNORMAL LOW (ref >60)
GFR calc non Af Amer: >60 mL/min (ref >60)
Glucose, Bld: 177 mg/dL — ABNORMAL HIGH (ref 70–99)
Glucose, Bld: 128 mg/dL — ABNORMAL HIGH (ref 70–99)
Potassium: 4 mmol/L (ref 3.5–5.1)
Potassium: 4.6 mmol/L (ref 3.5–5.1)
Sodium: 138 mmol/L (ref 135–145)
Sodium: 140 mmol/L (ref 135–145)

## 2019-07-20 LAB — BLOOD CULTURE ID PANEL (REFLEXED)

## 2019-07-20 LAB — RESPIRATORY PANEL BY PCR

## 2019-07-20 LAB — GLUCOSE, CAPILLARY
Glucose-Capillary: 111 mg/dL — ABNORMAL HIGH (ref 70–99)
Glucose-Capillary: 131 mg/dL — ABNORMAL HIGH (ref 70–99)
Glucose-Capillary: 141 mg/dL — ABNORMAL HIGH (ref 70–99)
Glucose-Capillary: 165 mg/dL — ABNORMAL HIGH (ref 70–99)
Glucose-Capillary: 173 mg/dL — ABNORMAL HIGH (ref 70–99)
Glucose-Capillary: 173 mg/dL — ABNORMAL HIGH (ref 70–99)

## 2019-07-20 LAB — POCT I-STAT 7, (LYTES, BLD GAS, ICA,H+H)
Acid-base deficit: 4 mmol/L — ABNORMAL HIGH (ref 0.0–2.0)
Bicarbonate: 18.4 mmol/L — ABNORMAL LOW (ref 20.0–28.0)
Calcium, Ion: 1.09 mmol/L — ABNORMAL LOW (ref 1.15–1.40)
HCT: 33 % — ABNORMAL LOW (ref 39.0–52.0)
Hemoglobin: 11.2 g/dL — ABNORMAL LOW (ref 13.0–17.0)
O2 Saturation: 100 %
Potassium: 4.2 mmol/L (ref 3.5–5.1)
Sodium: 140 mmol/L (ref 135–145)
TCO2: 19 mmol/L — ABNORMAL LOW (ref 22–32)
pCO2 arterial: 26.1 mmHg — ABNORMAL LOW (ref 32.0–48.0)
pH, Arterial: 7.457 — ABNORMAL HIGH (ref 7.350–7.450)
pO2, Arterial: 153 mmHg — ABNORMAL HIGH (ref 83.0–108.0)

## 2019-07-20 LAB — STREP PNEUMONIAE URINARY ANTIGEN: Strep Pneumo Urinary Antigen: NEGATIVE

## 2019-07-20 LAB — BRAIN NATRIURETIC PEPTIDE: B Natriuretic Peptide: 138.4 pg/mL — ABNORMAL HIGH (ref 0.0–100.0)

## 2019-07-20 LAB — LACTIC ACID, PLASMA
Lactic Acid, Venous: 3.6 mmol/L (ref 0.5–1.9)
Lactic Acid, Venous: 4.6 mmol/L (ref 0.5–1.9)
Lactic Acid, Venous: 4.8 mmol/L (ref 0.5–1.9)

## 2019-07-20 LAB — MAGNESIUM: Magnesium: 1.6 mg/dL — ABNORMAL LOW (ref 1.7–2.4)

## 2019-07-20 LAB — PHOSPHORUS: Phosphorus: 3 mg/dL (ref 2.5–4.6)

## 2019-07-20 LAB — HIV ANTIBODY (ROUTINE TESTING W REFLEX): HIV Screen 4th Generation wRfx: NONREACTIVE

## 2019-07-20 LAB — TRIGLYCERIDES: Triglycerides: 98 mg/dL (ref <150)

## 2019-07-20 LAB — MRSA PCR SCREENING: MRSA by PCR: NEGATIVE

## 2019-07-20 MED ORDER — ACETAMINOPHEN 650 MG RE SUPP: 650.0000 mg | Freq: Once | RECTAL | Status: DC

## 2019-07-20 MED ORDER — PROPOFOL 1000 MG/100ML IV EMUL
0.0000 ug/kg/min | INTRAVENOUS | Status: DC
  Administered 2019-07-20: 10 ug/kg/min via INTRAVENOUS
  Filled 2019-07-20 (×2): qty 100

## 2019-07-20 MED ORDER — CITALOPRAM HYDROBROMIDE 10 MG PO TABS
10.0000 mg | ORAL_TABLET | Freq: Every day | ORAL | Status: DC
  Administered 2019-07-20 – 2019-07-26 (×7): 10 mg
  Filled 2019-07-20 (×7): qty 1

## 2019-07-20 MED ORDER — NOREPINEPHRINE 4 MG/250ML-% IV SOLN
INTRAVENOUS | Status: AC
  Administered 2019-07-20: 4 mg
  Filled 2019-07-20: qty 250

## 2019-07-20 MED ORDER — METRONIDAZOLE IN NACL 5-0.79 MG/ML-% IV SOLN
500.0000 mg | Freq: Once | INTRAVENOUS | Status: AC
  Administered 2019-07-20: 500 mg via INTRAVENOUS
  Filled 2019-07-20: qty 100

## 2019-07-20 MED ORDER — VITAL HIGH PROTEIN PO LIQD: 1000.0000 mL | ORAL | Status: DC

## 2019-07-20 MED ORDER — INSULIN ASPART 100 UNIT/ML ~~LOC~~ SOLN
0.0000 [IU] | SUBCUTANEOUS | Status: DC
  Administered 2019-07-20: 05:00:00 2 [IU] via SUBCUTANEOUS
  Administered 2019-07-20 (×2): 3 [IU] via SUBCUTANEOUS
  Administered 2019-07-20: 2 [IU] via SUBCUTANEOUS
  Administered 2019-07-20 – 2019-07-21 (×5): 3 [IU] via SUBCUTANEOUS
  Administered 2019-07-21 (×2): 2 [IU] via SUBCUTANEOUS
  Administered 2019-07-22 (×3): 3 [IU] via SUBCUTANEOUS
  Administered 2019-07-22 (×2): 2 [IU] via SUBCUTANEOUS
  Administered 2019-07-22: 3 [IU] via SUBCUTANEOUS
  Administered 2019-07-23: 2 [IU] via SUBCUTANEOUS
  Administered 2019-07-23 – 2019-07-24 (×6): 3 [IU] via SUBCUTANEOUS
  Administered 2019-07-24: 2 [IU] via SUBCUTANEOUS
  Administered 2019-07-24 – 2019-07-25 (×3): 3 [IU] via SUBCUTANEOUS
  Administered 2019-07-25: 2 [IU] via SUBCUTANEOUS
  Administered 2019-07-25 (×3): 3 [IU] via SUBCUTANEOUS
  Administered 2019-07-26: 2 [IU] via SUBCUTANEOUS
  Administered 2019-07-26 (×3): 3 [IU] via SUBCUTANEOUS
  Administered 2019-07-26: 5 [IU] via SUBCUTANEOUS

## 2019-07-20 MED ORDER — PHENYLEPHRINE 40 MCG/ML (10ML) SYRINGE FOR IV PUSH (FOR BLOOD PRESSURE SUPPORT)
PREFILLED_SYRINGE | INTRAVENOUS | Status: AC
  Filled 2019-07-20: qty 10

## 2019-07-20 MED ORDER — FENTANYL CITRATE (PF) 100 MCG/2ML IJ SOLN: 50.0000 ug | INTRAMUSCULAR | Status: DC | PRN

## 2019-07-20 MED ORDER — SENNOSIDES 8.8 MG/5ML PO SYRP
5.0000 mL | ORAL_SOLUTION | Freq: Two times a day (BID) | ORAL | Status: DC | PRN
  Administered 2019-07-21: 5 mL
  Filled 2019-07-20: qty 5

## 2019-07-20 MED ORDER — BISACODYL 10 MG RE SUPP: 10.0000 mg | Freq: Every day | RECTAL | Status: DC | PRN

## 2019-07-20 MED ORDER — CLOPIDOGREL BISULFATE 75 MG PO TABS
75.0000 mg | ORAL_TABLET | Freq: Every day | ORAL | Status: DC
  Administered 2019-07-20 – 2019-07-26 (×7): 75 mg
  Filled 2019-07-20 (×7): qty 1

## 2019-07-20 MED ORDER — ACETAMINOPHEN 500 MG PO TABS
1000.0000 mg | ORAL_TABLET | Freq: Once | ORAL | Status: DC
  Filled 2019-07-20: qty 2

## 2019-07-20 MED ORDER — NOREPINEPHRINE 4 MG/250ML-% IV SOLN
INTRAVENOUS | Status: AC
  Filled 2019-07-20: qty 250

## 2019-07-20 MED ORDER — ROCURONIUM BROMIDE 50 MG/5ML IV SOLN
INTRAVENOUS | Status: AC | PRN
  Administered 2019-07-20: 150 mg via INTRAVENOUS

## 2019-07-20 MED ORDER — SODIUM CHLORIDE 0.9 % IV SOLN
2.0000 g | INTRAVENOUS | Status: DC
  Administered 2019-07-21: 2 g via INTRAVENOUS
  Filled 2019-07-20: qty 20

## 2019-07-20 MED ORDER — SODIUM CHLORIDE 0.9 % IV SOLN
INTRAVENOUS | Status: DC | PRN
  Administered 2019-07-20 – 2019-07-22 (×2): via INTRAVENOUS

## 2019-07-20 MED ORDER — SODIUM CHLORIDE 0.9% FLUSH
10.0000 mL | Freq: Two times a day (BID) | INTRAVENOUS | Status: DC
  Administered 2019-07-20 – 2019-07-26 (×10): 10 mL

## 2019-07-20 MED ORDER — HEPARIN SODIUM (PORCINE) 5000 UNIT/ML IJ SOLN
5000.0000 [IU] | Freq: Three times a day (TID) | INTRAMUSCULAR | Status: DC
  Administered 2019-07-20 – 2019-07-26 (×20): 5000 [IU] via SUBCUTANEOUS
  Filled 2019-07-20 (×21): qty 1

## 2019-07-20 MED ORDER — ATORVASTATIN CALCIUM 40 MG PO TABS
40.0000 mg | ORAL_TABLET | Freq: Every day | ORAL | Status: DC
  Administered 2019-07-20: 18:00:00 40 mg
  Filled 2019-07-20: qty 1

## 2019-07-20 MED ORDER — NOREPINEPHRINE 16 MG/250ML-% IV SOLN
0.0000 ug/min | INTRAVENOUS | Status: DC
  Administered 2019-07-20: 04:00:00 40 ug/min via INTRAVENOUS
  Administered 2019-07-20: 20 ug/min via INTRAVENOUS
  Filled 2019-07-20 (×3): qty 250

## 2019-07-20 MED ORDER — SODIUM CHLORIDE 0.9 % IV BOLUS
500.0000 mL | Freq: Once | INTRAVENOUS | Status: AC
  Administered 2019-07-20: 500 mL via INTRAVENOUS

## 2019-07-20 MED ORDER — NOREPINEPHRINE 4 MG/250ML-% IV SOLN: 0.0000 ug/min | INTRAVENOUS | Status: DC

## 2019-07-20 MED ORDER — BUDESONIDE 0.25 MG/2ML IN SUSP
0.2500 mg | Freq: Two times a day (BID) | RESPIRATORY_TRACT | Status: DC
  Administered 2019-07-20 – 2019-07-26 (×13): 0.25 mg via RESPIRATORY_TRACT
  Filled 2019-07-20 (×13): qty 2

## 2019-07-20 MED ORDER — CHLORHEXIDINE GLUCONATE 0.12% ORAL RINSE (MEDLINE KIT)
15.0000 mL | Freq: Two times a day (BID) | OROMUCOSAL | Status: DC
  Administered 2019-07-20 – 2019-07-21 (×3): 15 mL via OROMUCOSAL

## 2019-07-20 MED ORDER — PRO-STAT SUGAR FREE PO LIQD: 30.0000 mL | Freq: Two times a day (BID) | ORAL | Status: DC

## 2019-07-20 MED ORDER — PROPOFOL 1000 MG/100ML IV EMUL
INTRAVENOUS | Status: AC
  Filled 2019-07-20: qty 100

## 2019-07-20 MED ORDER — ORAL CARE MOUTH RINSE
15.0000 mL | OROMUCOSAL | Status: DC
  Administered 2019-07-20 – 2019-07-21 (×12): 15 mL via OROMUCOSAL

## 2019-07-20 MED ORDER — FINASTERIDE 5 MG PO TABS
5.0000 mg | ORAL_TABLET | Freq: Every day | ORAL | Status: DC
  Administered 2019-07-20 – 2019-07-26 (×7): 5 mg via ORAL
  Filled 2019-07-20 (×7): qty 1

## 2019-07-20 MED ORDER — FENTANYL 2500MCG IN NS 250ML (10MCG/ML) PREMIX INFUSION
50.0000 ug/h | INTRAVENOUS | Status: DC
  Administered 2019-07-20: 12:00:00 50 ug/h via INTRAVENOUS
  Filled 2019-07-20: qty 250

## 2019-07-20 MED ORDER — QUETIAPINE FUMARATE 50 MG PO TABS
100.0000 mg | ORAL_TABLET | Freq: Every day | ORAL | Status: DC
  Administered 2019-07-20: 21:00:00 100 mg
  Filled 2019-07-20: qty 2

## 2019-07-20 MED ORDER — LORAZEPAM 2 MG/ML IJ SOLN: 2.0000 mg | INTRAMUSCULAR | Status: DC | PRN

## 2019-07-20 MED ORDER — ETOMIDATE 2 MG/ML IV SOLN
INTRAVENOUS | Status: AC | PRN
  Administered 2019-07-20: 30 mg via INTRAVENOUS

## 2019-07-20 MED ORDER — VITAL HIGH PROTEIN PO LIQD
1000.0000 mL | ORAL | Status: DC
Start: 2019-07-20 — End: 2019-07-21
  Administered 2019-07-20 – 2019-07-21 (×3): 1000 mL

## 2019-07-20 MED ORDER — FAMOTIDINE IN NACL 20-0.9 MG/50ML-% IV SOLN: 20.0000 mg | Freq: Two times a day (BID) | INTRAVENOUS | Status: DC

## 2019-07-20 MED ORDER — POTASSIUM CHLORIDE 20 MEQ/15ML (10%) PO SOLN
40.0000 meq | Freq: Once | ORAL | Status: AC
  Administered 2019-07-20: 40 meq
  Filled 2019-07-20: qty 30

## 2019-07-20 MED ORDER — SODIUM CHLORIDE 0.9% FLUSH
10.0000 mL | INTRAVENOUS | Status: DC | PRN
  Administered 2019-07-20 (×2): 10 mL
  Filled 2019-07-20 (×2): qty 40

## 2019-07-20 MED ORDER — CHLORHEXIDINE GLUCONATE CLOTH 2 % EX PADS
6.0000 | MEDICATED_PAD | Freq: Every day | CUTANEOUS | Status: DC
  Administered 2019-07-20 – 2019-07-25 (×5): 6 via TOPICAL

## 2019-07-20 MED ORDER — ASPIRIN 81 MG PO CHEW
81.0000 mg | CHEWABLE_TABLET | Freq: Every day | ORAL | Status: DC
  Administered 2019-07-20 – 2019-07-26 (×7): 81 mg
  Filled 2019-07-20 (×7): qty 1

## 2019-07-20 MED ORDER — FAMOTIDINE 40 MG/5ML PO SUSR
20.0000 mg | Freq: Two times a day (BID) | ORAL | Status: DC
  Administered 2019-07-20 – 2019-07-21 (×3): 20 mg
  Filled 2019-07-20 (×3): qty 2.5

## 2019-07-20 MED ORDER — FENTANYL CITRATE (PF) 100 MCG/2ML IJ SOLN: 50.0000 ug | Freq: Once | INTRAMUSCULAR | Status: DC

## 2019-07-20 MED ORDER — FENTANYL BOLUS VIA INFUSION
50.0000 ug | INTRAVENOUS | Status: DC | PRN
  Administered 2019-07-20 (×2): 50 ug via INTRAVENOUS
  Filled 2019-07-20: qty 50

## 2019-07-20 NOTE — Procedures (Signed)
Intubation Procedure Note Michael Bright [id number] 12-18-1955  Procedure: Intubation Indications: Respiratory insufficiency  Procedure Details Consent: Risks of procedure as well as the alternatives and risks of each were explained to the (patient/caregiver).  Consent for procedure obtained. Time Out: Verified patient identification, verified procedure, site/side was marked, verified correct patient position, special equipment/implants available, medications/allergies/relevent history reviewed, required imaging and test results available.  Performed  Maximum sterile technique was used including gloves, hand hygiene and mask.  VL glidescope d blade used, grade I view     Evaluation Hemodynamic Status: BP stable peri-intubation, hypotension developing with addition of propofol for sedation post-intubation; O2 sats: transiently fell during during procedure Patient's Current Condition: stable Complications: No apparent complications Patient did tolerate procedure well. Chest X-ray ordered to verify placement.  CXR: tube position low-repostitioned.   Maryjane Hurter 07/20/2019

## 2019-07-20 NOTE — Progress Notes (Signed)
PT transported to 70m no complications

## 2019-07-20 NOTE — Progress Notes (Signed)
[facility] Progress Note Patient Name: Shyheem Whitham DOB: 06/25/1956 MRN: [id number]   Date of Service  07/20/2019  HPI/Events of Note  Pt admitted to the ICU with septic shock due to aspiration pneumonia , with possible UTI as well.  eICU Interventions  New patient evaluation completed.        Daviana Haymaker U Reynaldo Rossman 07/20/2019, 4:30 AM

## 2019-07-20 NOTE — Progress Notes (Signed)
RT pulled pt ETT back to 23 @ lip.

## 2019-07-20 NOTE — ED Notes (Signed)
Sp02 re-checked multiple times for positioning, noted Sp02 remained lower. Placed patient on non re breather at 15L. Notified ERMD and portable xray is ordered.

## 2019-07-20 NOTE — Progress Notes (Signed)
Initial Nutrition Assessment  DOCUMENTATION CODES:   Morbid obesity  INTERVENTION:    Vital High Protein at 80 ml/h (1920 ml per day)   Provides 1920 kcal, 168 gm protein, 1605 ml free water daily  NUTRITION DIAGNOSIS:   Inadequate oral intake related to inability to eat as evidenced by NPO status.  GOAL:   Provide needs based on ASPEN/SCCM guidelines  MONITOR:   Vent status, Labs, TF tolerance, Skin, I & O's  REASON FOR ASSESSMENT:   Ventilator, Consult Enteral/tube feeding initiation and management  ASSESSMENT:   63 yo male admitted with dyspnea, severe sepsis, UTI vs CAP. Intubated in the ED. PMH includes DM, recent CVA, trach s/p decannulation 9/22, G-tube.   Discussed patient in ICU rounds and with RN today. Received MD Consult for TF initiation and management. G tube in place. Respiratory virus panel + rhino/enterovirus.   Patient is currently intubated on ventilator support MV: 13.4 L/min Temp (24hrs), Avg:100.4 F (38 C), Min:99.1 F (37.3 C), Max:102.3 F (39.1 C)  Propofol: currently off  Labs reviewed. BUN 25 (H), creatinine 1.45 (H) CBG's: 141-173  Medications reviewed and include novolog, levophed.    NUTRITION - FOCUSED PHYSICAL EXAM:  unable to complete  Diet Order:   Diet Order            Diet NPO time specified  Diet effective now              EDUCATION NEEDS:   No education needs have been identified at this time  Skin:  Skin Assessment: Reviewed RN Assessment  Last BM:  no BM documented since admission  Height:   Ht Readings from Last 1 Encounters:  07/19/19 5\' 10"  (1.778 m)    Weight:   Wt Readings from Last 1 Encounters:  07/20/19 (!) 143.2 kg    Ideal Body Weight:  75.5 kg  BMI:  Body mass index is 45.3 kg/m.  Estimated Nutritional Needs:   Kcal:  1875-2000  Protein:  150-185 gm  Fluid:  >/= 2 L    Molli Barrows, RD, LDN, [place] Pager [phone] After Hours Pager [phone]

## 2019-07-20 NOTE — H&P (Signed)
NAME:  [facility], MRN:  [id number], DOB:  1956-07-01, LOS: 0 ADMISSION DATE:  07/19/2019, CONSULTATION DATE:  10/1 REFERRING MD:  Dr. Billy [facility] EDP, CHIEF COMPLAINT:  Dypsnea   Brief History   63 year old male with recent admission for L medullary stroke. Required trach due to dysphagia and aspiration. He was decannulated in CIR from where he was discharged 9/28. Now he is again presenting to [facility] ED 9/30 with complaints of dyspnea. Treated for severe sepsis (urine vs PNA) and started on BiPAP. Admitted to ICU.   History of present illness   63 year old male with PMH as below, which is significant for DM, of which he had been noncompliant for several years. 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 His course was complicated by dysphagia and aspiration pneumonia requiring intubation and ultimately tracheostomy (8/21). He was [facility] to [facility] and had notable progression. His trach was removed 9/22 after successful capping. He was using nocturnal CPAP at the time. Discharged to SNF on 9/28.  Presented again to [facility] ED on 9/30 with complaints of cough, weakness, fevers, and syncopal episode. Upon arrival to the ED he was found to e significantly hypotensive with SBP in 70s. Workup concerning for HCAP +/- UTI. Lactic acid 7 he was resuscitated with 4.5L IVF and his BP seemed to stabilize. Empiric antibiotics started. His respiratory status declined and he was placed on BiPAP. PCCM asked to admit.   Past Medical History   has a past medical history of Diabetes ([facility]). CVA   Significant [facility] Events     Consults:    Procedures:    Significant Diagnostic Tests:    Micro Data:  Blood 9/30 > Urine 9/30 > Tracheal aspirate 9/30 > RVP 10/1 > Strep urinary antigen 10/1 Legionella 10/1  Antimicrobials:  Cefepime 9/30 > Vancomycin 9/30 > Flagyl 9/30 >10/1  Interim history/subjective:  Cough and  trouble breathing for day.   Objective   Blood pressure 106/65, pulse (!) 134, temperature (!) 102.3 F (39.1 C), temperature source Rectal, resp. rate (!) 55, height '5\' 10"'$  (1.778 m), weight (!) 141 kg, SpO2 93 %.    FiO2 (%):  [90 %] 90 %   Intake/Output Summary (Last 24 hours) at 07/20/2019 0128 Last data filed at 07/19/2019 2217 Gross per 24 hour  Intake 999 ml  Output -  Net 999 ml   Filed Weights   07/19/19 2118 07/19/19 2204  Weight: (!) 141.4 kg (!) 141 kg    Examination: General: morbidly obese male on BiPAP in distress.  HENT: [facility]/AT, PERRL, no JVD Lungs: Bilateral rhonchi, increased work of breathing. On BiPAP only able to speak one or two words at a time.  Cardiovascular: Tachy, regular, no MRG Abdomen: Soft, non-tender, nondistended Extremities: No acute deformity, no edema.  Neuro: Alert, oriented. ROM 5/5 in all extremities.  Resolved Bright Problem list     Assessment & Plan:   Acute hypoxemic respiratory failure: HCAP vs aspiration PNA. Dysphagia was felt to have improved after recent stroke and his trach was decannulated on 9/22. Failing BiPAP currenlty - STAT intubation - Full vent support - ABG - VAP bundle  Severe Sepsis: ddx includes PNA, UTI. BP stabilized after 4.5L crystalloid - Empiric antibiotics  - DC flagyl - Follow cultures - MAP goal 77mHg  AKI Hypokalemia - Continue gentle IVF - Supplement K via PEG - Follow BMP  Dysphagia:  sp L medullary CVA - PEG in place  Recent CVA: Left medullary syndrome. R sided strength has recovered.  - ASA/plavix 3 months per neurology  Transaminitis; suspect shock related - Repeat LFT  Hypertension - Holding home amlodipine,   DM:  - SSI - Hold home lantus until taking enteral nutrition  OSA on CPAP: - CPAP if he should be extubated - I suspect he will again need trach, which we should expect to be lifelong and would be curative for OSA.    Best practice:  Diet: NPO  Pain/Anxiety/Delirium protocol (if indicated): Propofol. RASS goal -1 to -2.  VAP protocol (if indicated): YES DVT prophylaxis: SQH GI prophylaxis:  Glucose control: SSI Mobility: BR Code Status: FULL Family Communication: Wife updated 10/1 0200 Disposition: ICU  Labs   CBC: Recent Labs  Lab 07/17/19 0532 07/19/19 2110  WBC 7.2 5.0  NEUTROABS  --  4.8  HGB 12.0* 13.7  HCT 35.1* 43.2  MCV 90.5 95.6  PLT 236 301    Basic Metabolic Panel: Recent Labs  Lab 07/17/19 0532 07/19/19 2110  NA 139 136  K 4.1 3.4*  CL 101 100  CO2 28 20*  GLUCOSE 123* 173*  BUN 14 22  CREATININE 0.78 1.43*  CALCIUM 8.9 8.8*   GFR: Estimated Creatinine Clearance: 75.9 mL/min (A) (by C-G formula based on SCr of 1.43 mg/dL (H)). Recent Labs  Lab 07/17/19 0532 07/19/19 0004 07/19/19 2110  WBC 7.2  --  5.0  LATICACIDVEN  --  4.6* 7.1*    Liver Function Tests: Recent Labs  Lab 07/19/19 2110  AST 98*  ALT 69*  ALKPHOS 172*  BILITOT 1.1  PROT 7.7  ALBUMIN 3.2*   No results for input(s): LIPASE, AMYLASE in the last 168 hours. No results for input(s): AMMONIA in the last 168 hours.  ABG    Component Value Date/Time   PHART 7.459 (H) 06/21/2019 2315   PCO2ART 48.5 (H) 06/21/2019 2315   PO2ART 67.6 (L) 06/21/2019 2315   HCO3 33.9 (H) 06/21/2019 2315   TCO2 27 06/12/2019 0131   O2SAT 94.2 06/21/2019 2315     Coagulation Profile: Recent Labs  Lab 07/19/19 2110  INR 1.5*    Cardiac Enzymes: No results for input(s): CKTOTAL, CKMB, CKMBINDEX, TROPONINI in the last 168 hours.  HbA1C: Hgb A1c MFr Bld  Date/Time Value Ref Range Status  05/30/2019 11:22 AM 10.6 (H) 4.8 - 5.6 % Final    Comment:    (NOTE) Pre diabetes:          5.7%-6.4% Diabetes:              >6.4% Glycemic control for   <7.0% adults with diabetes     CBG: Recent Labs  Lab 07/16/19 0616 07/16/19 1212 07/16/19 1622 07/16/19 2106 07/17/19 0632  GLUCAP 108* 110* 141* 87 111*    Review of  Systems:   Unable as patient is profoundly dyspneic.   Past Medical History  He,  has a past medical history of Diabetes ([place]).   Surgical History    Past Surgical History:  Procedure Laterality Date  . CHOLECYSTECTOMY    . IR GASTROSTOMY TUBE MOD SED  07/04/2019     Social History   reports that he has never smoked. He has never used smokeless tobacco. He reports current alcohol use. He reports that he does not use drugs.   Family History   His family history is not on file.   Allergies No Known Allergies  Home Medications  Prior to Admission medications   Medication Sig Start Date End Date Taking? Authorizing Provider  acetaminophen (TYLENOL) 325 MG tablet Place 1-2 tablets (325-650 mg total) into feeding tube every 4 (four) hours as needed for mild pain. 07/17/19   Love, Ivan Anchors, PA-C  Amino Acids-Protein Hydrolys (FEEDING SUPPLEMENT, PRO-STAT SUGAR FREE 64,) LIQD Place 30 mLs into feeding tube 2 (two) times daily. 07/17/19   Love, Ivan Anchors, PA-C  amLODipine (NORVASC) 10 MG tablet Place 1 tablet (10 mg total) into feeding tube daily. 07/17/19   Love, Ivan Anchors, PA-C  aspirin 81 MG chewable tablet Place 1 tablet (81 mg total) into feeding tube daily. 06/28/19   Debbe Odea, MD  atorvastatin (LIPITOR) 40 MG tablet Place 1 tablet (40 mg total) into feeding tube daily at 6 PM. 07/17/19   Love, Ivan Anchors, PA-C  bethanechol (URECHOLINE) 25 MG tablet Place 1 tablet (25 mg total) into feeding tube 4 (four) times daily. 07/17/19   Love, Ivan Anchors, PA-C  blood glucose meter kit and supplies KIT Dispense based on patient and insurance preference. Use up to four times daily as directed. (FOR ICD-9 250.00, 250.01). 07/17/19   Love, Ivan Anchors, PA-C  budesonide (PULMICORT) 0.25 MG/2ML nebulizer solution Take 2 mLs (0.25 mg total) by nebulization 2 (two) times daily. 07/17/19   Love, Ivan Anchors, PA-C  citalopram (CELEXA) 10 MG tablet Place 1 tablet (10 mg total) into feeding tube daily. 07/18/19   Love,  Ivan Anchors, PA-C  clopidogrel (PLAVIX) 75 MG tablet Place 1 tablet (75 mg total) into feeding tube daily. 07/17/19   Love, Ivan Anchors, PA-C  famotidine (PEPCID) 40 MG/5ML suspension Place 2.5 mLs (20 mg total) into feeding tube 2 (two) times daily. 07/17/19   Love, Ivan Anchors, PA-C  finasteride (PROSCAR) 5 MG tablet Put in tube 07/17/19   Love, Ivan Anchors, PA-C  glycopyrrolate (ROBINUL) 1 MG tablet Place 2 tablets (2 mg total) into feeding tube 2 (two) times daily. 07/17/19   Love, Ivan Anchors, PA-C  guaiFENesin-dextromethorphan (ROBITUSSIN DM) 100-10 MG/5ML syrup Place 15 mLs into feeding tube every 6 (six) hours. 07/17/19   Love, Ivan Anchors, PA-C  Insulin Glargine (LANTUS) 100 UNIT/ML Solostar Pen Inject 33 Units into the skin daily. 07/17/19   Love, Ivan Anchors, PA-C  Insulin Pen Needle (PEN NEEDLES) 32G X 6 MM MISC 1 application by Does not apply route daily. 07/17/19   Love, Ivan Anchors, PA-C  mouth rinse LIQD solution 15 mLs by Mouth Rinse route 5 (five) times daily. 07/17/19   Love, Ivan Anchors, PA-C  Nutritional Supplements (FEEDING SUPPLEMENT, OSMOLITE 1.5 CAL,) LIQD Place 474 mLs into feeding tube 3 (three) times daily with meals. 07/17/19   Love, Ivan Anchors, PA-C  QUEtiapine (SEROQUEL) 100 MG tablet Place 1 tablet (100 mg total) into feeding tube at bedtime. 07/17/19   Love, Ivan Anchors, PA-C  tamsulosin (FLOMAX) 0.4 MG CAPS capsule Open capsule and administer medication via PEG tube 07/17/19   Love, Ivan Anchors, PA-C  Water For Irrigation, Sterile (FREE WATER) SOLN Place 200 mLs into feeding tube 4 (four) times daily -  with meals and at bedtime. Use filtered or bottled water (not distilled water) 07/17/19   Love, Ivan Anchors, PA-C     Critical care time: 45 mins     CRITICAL CARE Performed by: Corey Harold  Critical care time was exclusive of separately billable procedures and treating other patients.  Critical care was necessary to treat or prevent  imminent or life-threatening deterioration due to acute hypoxic  respiratory failure requiring mechanical ventilation, pneumonia, UTI, AKI.   Critical care was time spent personally by me on the following activities: development of treatment plan with patient and/or surrogate as well as nursing, discussions with consultants, evaluation of patient's response to treatment, examination of patient, obtaining history from patient or surrogate, ordering and performing treatments and interventions, ordering and review of laboratory studies, ordering and review of radiographic studies, pulse oximetry and re-evaluation of patient's condition.   Georgann Housekeeper, AGACNP-BC [place] Pager [phone] or [phone]  07/20/2019 2:43 AM

## 2019-07-20 NOTE — Progress Notes (Signed)
PHARMACY - PHYSICIAN COMMUNICATION CRITICAL VALUE ALERT - BLOOD CULTURE IDENTIFICATION (BCID)  [facility] is an 63 y.o. male - potentially UTI/sepsis  Assessment:  1/2 blood cx positive for kleb pna on bcid, also with 100 K GNR in urine  Name of physician (or Provider) Contacted: Georgann Housekeeper, NP  Current antibiotics: Cefepime  Changes to prescribed antibiotics recommended:  DC Cefepime Ceftriaxone 2 g q24  Results for orders placed or performed during the hospital encounter of 07/19/19  Blood Culture ID Panel (Reflexed) (Collected: 07/19/2019  8:46 PM)  Result Value Ref Range   Enterococcus species NOT DETECTED NOT DETECTED   Listeria monocytogenes NOT DETECTED NOT DETECTED   Staphylococcus species NOT DETECTED NOT DETECTED   Staphylococcus aureus (BCID) NOT DETECTED NOT DETECTED   Streptococcus species NOT DETECTED NOT DETECTED   Streptococcus agalactiae NOT DETECTED NOT DETECTED   Streptococcus pneumoniae NOT DETECTED NOT DETECTED   Streptococcus pyogenes NOT DETECTED NOT DETECTED   Acinetobacter baumannii NOT DETECTED NOT DETECTED   Enterobacteriaceae species DETECTED (A) NOT DETECTED   Enterobacter cloacae complex NOT DETECTED NOT DETECTED   Escherichia coli NOT DETECTED NOT DETECTED   Klebsiella oxytoca NOT DETECTED NOT DETECTED   Klebsiella pneumoniae DETECTED (A) NOT DETECTED   Proteus species NOT DETECTED NOT DETECTED   Serratia marcescens NOT DETECTED NOT DETECTED   Carbapenem resistance NOT DETECTED NOT DETECTED   Haemophilus influenzae NOT DETECTED NOT DETECTED   Neisseria meningitidis NOT DETECTED NOT DETECTED   Pseudomonas aeruginosa NOT DETECTED NOT DETECTED   Candida albicans NOT DETECTED NOT DETECTED   Candida glabrata NOT DETECTED NOT DETECTED   Candida krusei NOT DETECTED NOT DETECTED   Candida parapsilosis NOT DETECTED NOT DETECTED   Candida tropicalis NOT DETECTED NOT DETECTED   [name], PharmD, BCPS, BCCCP Clinical  Pharmacist [phone]  Please check AMION for all [place] numbers  07/20/2019 10:11 PM

## 2019-07-20 NOTE — Procedures (Signed)
Central Venous Catheter Insertion Procedure Note [facility] [id number] 12/24/1955  Procedure: Insertion of Central Venous Catheter Indications: Assessment of intravascular volume, Drug and/or fluid administration and Frequent blood sampling  Procedure Details Consent: Unable to obtain consent because of altered level of consciousness. Time Out: Verified patient identification, verified procedure, site/side was marked, verified correct patient position, special equipment/implants available, medications/allergies/relevent history reviewed, required imaging and test results available.  Performed  Maximum sterile technique was used including antiseptics, cap, gloves, gown, hand hygiene, mask and sheet. Skin prep: Chlorhexidine; local anesthetic administered A antimicrobial bonded/coated triple lumen catheter was placed in the left internal jugular vein using the Seldinger technique.  Evaluation Blood flow good Complications: No apparent complications Patient did tolerate procedure well. Chest X-ray ordered to verify placement.  CXR: pending.   Georgann Housekeeper, AGACNP-BC [place] Pager [phone] or [phone]  07/20/2019 3:23 AM

## 2019-07-20 NOTE — Progress Notes (Addendum)
NAME:  [facility], MRN:  [id number], DOB:  [birth year], LOS: 0 ADMISSION DATE:  07/19/2019, CONSULTATION DATE:  9/30 REFERRING MD:  ED, CHIEF COMPLAINT:  Septic shock   Brief History   63yM with DM, recent left medullary CVA complicated with trach (0/93) and g-tube placement. Trach decannulation 9/22 and discharged to [facility] 9/28.. Presented to [facility] 9/30 with dyspnea, productive cough, dysuria found to have septic shock with UTI/ aspiration pneumonia. In the ED, cultures collected, given >3.5L NS, started on Cefepime, flagyl, and vancomycin. Patient respiratory and circulatory system continued to worsen so was intubated and started on norepinephrine.  History of present illness   63 year old male with PMH as below, presented from SNF 9/30 with loss of consciousness and respiratory distress with weakness and fevers. Upon arrival to the ED he was found to be significantly hypotensive with SBP in 70s. Workup concerning for HCAP +/- UTI. Lactic acid 7 he was resuscitated with 4.5L IVF and started on levophed. Empiric antibiotics started. His respiratory status declined and he was placed on BiPAP. PCCM admitted and patient was then intubated for fatigue from work of breathing.  Past Medical History  has a past medical history of Diabetes ([place]). CVA  Significant Hospital Events   10/1- central line placed 10/1- intubated  Consults:    Procedures:    Significant Diagnostic Tests:    Micro Data:  Blood 9/30 > NG <12hr Urine 9/30 > sent Tracheal aspirate 9/30 > rare GPC and PMN predominance RVP 10/1 > positive for rhinovirus/ enterovirus Strep urinary antigen 10/1 Legionella 10/1  Antimicrobials:  Cefepime 9/30 > Vancomycin 9/30 > Flagyl 9/30 >10/1   Interim history/subjective:  On the floor, patient vitals stable and tolerated deescalation and discontinuing of levophed. Propofol was discontinued as this may have contributed to BP. Clinically, patient appears to have improving  circulation: responds to voice, quick capillary refill, return of urine output.  Objective   Blood pressure 107/63, pulse (!) 105, temperature 100.1 F (37.8 C), temperature source Axillary, resp. rate (!) 25, height '5\' 10"'$  (1.778 m), weight (!) 143.2 kg, SpO2 99 %.    Vent Mode: PRVC FiO2 (%):  [50 %-100 %] 60 % Set Rate:  [24 bmp] 24 bmp Vt Set:  [580 mL] 580 mL PEEP:  [5 cmH20] 5 cmH20 Plateau Pressure:  [21 cmH20-28 cmH20] 21 cmH20   Intake/Output Summary (Last 24 hours) at 07/20/2019 1312 Last data filed at 07/20/2019 1200 Gross per 24 hour  Intake 1832.02 ml  Output 900 ml  Net 932.02 ml   Filed Weights   07/19/19 2118 07/19/19 2204 07/20/19 0400  Weight: (!) 141.4 kg (!) 141 kg (!) 143.2 kg    Examination: General: Resting comfortably, eyes closed but moves arms to verbal stimulation HENT: Left IJ in place without signs of infection Lungs: Good breath sounds auscultated bilateral bases with diffuse stertor GI: Green aspiration Cardiovascular: Mildly tachycardic, regular rate, negative for murmurs Abdomen: Soft and obese, does not appear to be tender to palpation.  PEG tube in place Extremities: Thick dry skin in lower extremities with diffuse 1+ pitting edema in extremities Neuro: Eyes closed, alert to verbal stimuli, pupils reactive to light bilaterally GU: Red urine output  Resolved Hospital Problem list   Hypotension  Assessment & Plan:  Septic shock resolved #Sepsis improving-likely with contribution from pneumonia and/or urine -Urinalysis showed leukocytosis, proteins/RBC, many bacteria. Urine Cx pending (NG<12hr).  Patient was anuric for several hours but has now returned with approximately 450  mL output today. -LA improved 4.6>7.1>4.8 today. Last known fever 102.3 at 2110 9/30. 99.9 this am. HR improving to low 100s. BCx pending (NG <12hr).  - cefepime (9/30-) - flagyl (9/30) - vancomycin (9/30-10/1) - f/u BCx, UCx, resp quant off tracheal aspirate, urine  legionella -Wean Levophed with goal of map greater than 65  # Respiratory failure-improving FiO2 70%, O2 sat 99%- most recent chest xray appears mildly improved with less diffuse infiltrates and better visualization of left hemidiaphragm. Patient remains intubated.Respiratory Cx shows abundant WBC PMN predominant with rare gram+ cocci - lung protective ventilation, 6-8cc/kg - target rass 0 to -1 - pulmicort BID -Discontinue propofol -Fentanyl and as needed lorazepam  -Obtain ABG  -Continue antibiotics  -Consider diuresis if not improving and kidney function continues to improve  # AKI: Cr stable today 1.45 -Continue to monitor urine output Repeat be met to monitor creatinine and potassium and bicarb at 1530  Best practice:  Diet:  Closed suction and begin PEG tube feedings per nutrition Pain/Anxiety/Delirium protocol (if indicated):  Fentanyl and lorazepam as needed  VAP protocol (if indicated): YES DVT prophylaxis: SQH GI prophylaxis: Famotidine Glucose control: SSI Mobility: BR Code Status: FULL Family Communication: Wife updated 10/1 in room with patient Disposition: ICU  Labs   CBC: Recent Labs  Lab 07/17/19 0532 07/19/19 2110 07/20/19 0356  WBC 7.2 5.0 14.6*  NEUTROABS  --  4.8  --   HGB 12.0* 13.7 11.1*  HCT 35.1* 43.2 34.4*  MCV 90.5 95.6 95.0  PLT 236 209 419    Basic Metabolic Panel: Recent Labs  Lab 07/17/19 0532 07/19/19 2110 07/20/19 0356  NA 139 136 138  K 4.1 3.4* 4.0  CL 101 100 106  CO2 28 20* 18*  GLUCOSE 123* 173* 177*  BUN 14 22 25*  CREATININE 0.78 1.43* 1.45*  CALCIUM 8.9 8.8* 7.6*   GFR: Estimated Creatinine Clearance: 75.5 mL/min (A) (by C-G formula based on SCr of 1.45 mg/dL (H)). Recent Labs  Lab 07/17/19 0532 07/19/19 0004 07/19/19 2110 07/20/19 0356  WBC 7.2  --  5.0 14.6*  LATICACIDVEN  --  4.6* 7.1* 4.8*    Liver Function Tests: Recent Labs  Lab 07/19/19 2110  AST 98*  ALT 69*  ALKPHOS 172*  BILITOT 1.1   PROT 7.7  ALBUMIN 3.2*   No results for input(s): LIPASE, AMYLASE in the last 168 hours. No results for input(s): AMMONIA in the last 168 hours.  ABG    Component Value Date/Time   PHART 7.459 (H) 06/21/2019 2315   PCO2ART 48.5 (H) 06/21/2019 2315   PO2ART 67.6 (L) 06/21/2019 2315   HCO3 33.9 (H) 06/21/2019 2315   TCO2 27 06/12/2019 0131   O2SAT 94.2 06/21/2019 2315     Coagulation Profile: Recent Labs  Lab 07/19/19 2110  INR 1.5*   HbA1C: Hgb A1c MFr Bld  Date/Time Value Ref Range Status  05/30/2019 11:22 AM 10.6 (H) 4.8 - 5.6 % Final    Comment:    (NOTE) Pre diabetes:          5.7%-6.4% Diabetes:              >6.4% Glycemic control for   <7.0% adults with diabetes     CBG: Recent Labs  Lab 07/16/19 2106 07/17/19 0632 07/20/19 0348 07/20/19 0710 07/20/19 1127  GLUCAP 87 111* 141* 173* 165*    Review of Systems:   Unable to obtain  Past Medical History  He,  has a past medical  history of Diabetes ([place]).  CVA  Surgical History    Past Surgical History:  Procedure Laterality Date  . CHOLECYSTECTOMY    . IR GASTROSTOMY TUBE MOD SED  07/04/2019    Social History   reports that he has never smoked. He has never used smokeless tobacco. He reports current alcohol use. He reports that he does not use drugs.   Family History   His family history is not on file.   Allergies No Known Allergies   Home Medications  Prior to Admission medications   Medication Sig Start Date End Date Taking? Authorizing Provider  acetaminophen (TYLENOL) 325 MG tablet Place 1-2 tablets (325-650 mg total) into feeding tube every 4 (four) hours as needed for mild pain. 07/17/19   Love, Ivan Anchors, PA-C  Amino Acids-Protein Hydrolys (FEEDING SUPPLEMENT, PRO-STAT SUGAR FREE 64,) LIQD Place 30 mLs into feeding tube 2 (two) times daily. 07/17/19   Love, Ivan Anchors, PA-C  amLODipine (NORVASC) 10 MG tablet Place 1 tablet (10 mg total) into feeding tube daily. 07/17/19   Love, Ivan Anchors,  PA-C  aspirin 81 MG chewable tablet Place 1 tablet (81 mg total) into feeding tube daily. 06/28/19   Debbe Odea, MD  atorvastatin (LIPITOR) 40 MG tablet Place 1 tablet (40 mg total) into feeding tube daily at 6 PM. 07/17/19   Love, Ivan Anchors, PA-C  bethanechol (URECHOLINE) 25 MG tablet Place 1 tablet (25 mg total) into feeding tube 4 (four) times daily. 07/17/19   Love, Ivan Anchors, PA-C  blood glucose meter kit and supplies KIT Dispense based on patient and insurance preference. Use up to four times daily as directed. (FOR ICD-9 250.00, 250.01). 07/17/19   Love, Ivan Anchors, PA-C  budesonide (PULMICORT) 0.25 MG/2ML nebulizer solution Take 2 mLs (0.25 mg total) by nebulization 2 (two) times daily. 07/17/19   Love, Ivan Anchors, PA-C  citalopram (CELEXA) 10 MG tablet Place 1 tablet (10 mg total) into feeding tube daily. 07/18/19   Love, Ivan Anchors, PA-C  clopidogrel (PLAVIX) 75 MG tablet Place 1 tablet (75 mg total) into feeding tube daily. 07/17/19   Love, Ivan Anchors, PA-C  famotidine (PEPCID) 40 MG/5ML suspension Place 2.5 mLs (20 mg total) into feeding tube 2 (two) times daily. 07/17/19   Love, Ivan Anchors, PA-C  finasteride (PROSCAR) 5 MG tablet Put in tube 07/17/19   Love, Ivan Anchors, PA-C  glycopyrrolate (ROBINUL) 1 MG tablet Place 2 tablets (2 mg total) into feeding tube 2 (two) times daily. 07/17/19   Love, Ivan Anchors, PA-C  guaiFENesin-dextromethorphan (ROBITUSSIN DM) 100-10 MG/5ML syrup Place 15 mLs into feeding tube every 6 (six) hours. 07/17/19   Love, Ivan Anchors, PA-C  Insulin Glargine (LANTUS) 100 UNIT/ML Solostar Pen Inject 33 Units into the skin daily. 07/17/19   Love, Ivan Anchors, PA-C  Insulin Pen Needle (PEN NEEDLES) 32G X 6 MM MISC 1 application by Does not apply route daily. 07/17/19   Love, Ivan Anchors, PA-C  mouth rinse LIQD solution 15 mLs by Mouth Rinse route 5 (five) times daily. 07/17/19   Love, Ivan Anchors, PA-C  Nutritional Supplements (FEEDING SUPPLEMENT, OSMOLITE 1.5 CAL,) LIQD Place 474 mLs into feeding tube 3  (three) times daily with meals. 07/17/19   Love, Ivan Anchors, PA-C  QUEtiapine (SEROQUEL) 100 MG tablet Place 1 tablet (100 mg total) into feeding tube at bedtime. 07/17/19   Love, Ivan Anchors, PA-C  tamsulosin (FLOMAX) 0.4 MG CAPS capsule Open capsule and administer medication via PEG tube 07/17/19  Love, Ivan Anchors, PA-C  Water For Irrigation, Sterile (FREE WATER) SOLN Place 200 mLs into feeding tube 4 (four) times daily -  with meals and at bedtime. Use filtered or bottled water (not distilled water) 07/17/19   Love, Ivan Anchors, PA-C     Critical care time:**    Doristine Mango, DO Family medicine PGY 2

## 2019-07-20 NOTE — Progress Notes (Signed)
PCCM INTERVAL PROGRESS NOTE  BCID showing Klebsiella  BC growing GNR Urine growing GNR  Plan: Narrow to Ceftriaxone Await further culture   Georgann Housekeeper, AGACNP-BC [place] Pager [phone] or [phone]  07/20/2019 10:07 PM

## 2019-07-20 NOTE — Progress Notes (Signed)
PT placed on Bipap per MD order. 

## 2019-07-21 ENCOUNTER — Inpatient Hospital Stay (HOSPITAL_COMMUNITY): Payer: BC Managed Care – PPO

## 2019-07-21 LAB — URINE CULTURE: Culture: >=100000 — AB

## 2019-07-21 LAB — BASIC METABOLIC PANEL
Anion gap: 10 (ref 5–15)
Anion gap: 7 (ref 5–15)
BUN: 30 mg/dL — ABNORMAL HIGH (ref 8–23)
BUN: 29 mg/dL — ABNORMAL HIGH (ref 8–23)
CO2: 20 mmol/L — ABNORMAL LOW (ref 22–32)
CO2: 24 mmol/L (ref 22–32)
Calcium: 7.4 mg/dL — ABNORMAL LOW (ref 8.9–10.3)
Calcium: 7.9 mg/dL — ABNORMAL LOW (ref 8.9–10.3)
Chloride: 107 mmol/L (ref 98–111)
Chloride: 110 mmol/L (ref 98–111)
Creatinine, Ser: 1.02 mg/dL (ref 0.61–1.24)
Creatinine, Ser: 0.75 mg/dL (ref 0.61–1.24)
GFR calc Af Amer: >60 mL/min (ref >60)
GFR calc Af Amer: >60 mL/min (ref >60)
GFR calc non Af Amer: >60 mL/min (ref >60)
GFR calc non Af Amer: >60 mL/min (ref >60)
Glucose, Bld: 188 mg/dL — ABNORMAL HIGH (ref 70–99)
Glucose, Bld: 164 mg/dL — ABNORMAL HIGH (ref 70–99)
Potassium: 3.4 mmol/L — ABNORMAL LOW (ref 3.5–5.1)
Potassium: 4 mmol/L (ref 3.5–5.1)
Sodium: 137 mmol/L (ref 135–145)
Sodium: 141 mmol/L (ref 135–145)

## 2019-07-21 LAB — GLUCOSE, CAPILLARY
Glucose-Capillary: 142 mg/dL — ABNORMAL HIGH (ref 70–99)
Glucose-Capillary: 146 mg/dL — ABNORMAL HIGH (ref 70–99)
Glucose-Capillary: 172 mg/dL — ABNORMAL HIGH (ref 70–99)
Glucose-Capillary: 179 mg/dL — ABNORMAL HIGH (ref 70–99)
Glucose-Capillary: 180 mg/dL — ABNORMAL HIGH (ref 70–99)
Glucose-Capillary: 196 mg/dL — ABNORMAL HIGH (ref 70–99)

## 2019-07-21 LAB — CBC
HCT: 30.4 % — ABNORMAL LOW (ref 39.0–52.0)
Hemoglobin: 10.3 g/dL — ABNORMAL LOW (ref 13.0–17.0)
MCH: 31.5 pg (ref 26.0–34.0)
MCHC: 33.9 g/dL (ref 30.0–36.0)
MCV: 93 fL (ref 80.0–100.0)
Platelets: 177 10*3/uL (ref 150–400)
RBC: 3.27 MIL/uL — ABNORMAL LOW (ref 4.22–5.81)
RDW: 12.6 % (ref 11.5–15.5)
WBC: 21.3 10*3/uL — ABNORMAL HIGH (ref 4.0–10.5)
nRBC: 0 % (ref 0.0–0.2)

## 2019-07-21 LAB — HEPATIC FUNCTION PANEL
ALT: 82 U/L — ABNORMAL HIGH (ref 0–44)
AST: 53 U/L — ABNORMAL HIGH (ref 15–41)
Albumin: 2 g/dL — ABNORMAL LOW (ref 3.5–5.0)
Alkaline Phosphatase: 93 U/L (ref 38–126)
Bilirubin, Direct: 0.3 mg/dL — ABNORMAL HIGH (ref 0.0–0.2)
Indirect Bilirubin: 0.5 mg/dL (ref 0.3–0.9)
Total Bilirubin: 0.8 mg/dL (ref 0.3–1.2)
Total Protein: 5.7 g/dL — ABNORMAL LOW (ref 6.5–8.1)

## 2019-07-21 LAB — LEGIONELLA PNEUMOPHILA SEROGP 1 UR AG: L. pneumophila Serogp 1 Ur Ag: NEGATIVE

## 2019-07-21 LAB — MAGNESIUM: Magnesium: 1.8 mg/dL (ref 1.7–2.4)

## 2019-07-21 LAB — PHOSPHORUS: Phosphorus: 1.4 mg/dL — ABNORMAL LOW (ref 2.5–4.6)

## 2019-07-21 MED ORDER — POTASSIUM & SODIUM PHOSPHATES 280-160-250 MG PO PACK
1.0000 | PACK | Freq: Three times a day (TID) | ORAL | Status: AC
  Administered 2019-07-21 (×3): 1
  Filled 2019-07-21 (×3): qty 1

## 2019-07-21 MED ORDER — POTASSIUM CHLORIDE 20 MEQ/15ML (10%) PO SOLN
40.0000 meq | Freq: Once | ORAL | Status: AC
  Administered 2019-07-21: 40 meq
  Filled 2019-07-21: qty 30

## 2019-07-21 MED ORDER — ORAL CARE MOUTH RINSE
15.0000 mL | Freq: Two times a day (BID) | OROMUCOSAL | Status: DC
Start: 2019-07-21 — End: 2019-07-23
  Administered 2019-07-21 – 2019-07-22 (×3): 15 mL via OROMUCOSAL

## 2019-07-21 MED ORDER — CEFAZOLIN SODIUM-DEXTROSE 2-4 GM/100ML-% IV SOLN
2.0000 g | Freq: Three times a day (TID) | INTRAVENOUS | Status: DC
  Administered 2019-07-22 – 2019-07-24 (×7): 2 g via INTRAVENOUS
  Filled 2019-07-21 (×8): qty 100

## 2019-07-21 NOTE — Procedures (Signed)
Extubation Procedure Note  Patient Details:   Name: Michael Bright DOB: 1955/12/08 MRN: [id number]   Airway Documentation:    Vent end date: 07/21/19 Vent end time: 1200   Evaluation  O2 sats: stable throughout Complications: No apparent complications Patient did tolerate procedure well. Bilateral Breath Sounds: Clear, Diminished   Yes   Pt extubated to 4L [place] per MD order. Positive cuff leak noted prior to extubation. Pt able to speak and has a weak non productive cough post extubation. No stridor heard, no increased WOB, no distress. VS within normal limits. RT will continue to monitor  Jesse Sans 07/21/2019, 2:26 PM

## 2019-07-21 NOTE — Progress Notes (Signed)
Pharmacy Antibiotic Note  Michael Bright is a 63 y.o. male admitted on 07/19/2019 with sepsis.  Pharmacy has been consulted for cefzolin dosing. Blood and urine cultures from 9/30 grew klebsiella pneumoniae. Urine culture is sensitive to cefazolin and to current therapy received of ceftriaxone/cefepime and blood culture sensitivities are still pending. Respiratory panel was positive for rhinovirus/enterococcus. WBC remain elevated at 21.3 and Tmax is 100.1. Patient is on day 3 of appropriate antibiotics.   Plan: Start cefazolin 2g IV q8h at 0600 on 10/3 (24 hours after last dose ceftriaxone) Monitor blood culture sensitivities, length of therapy, and clinical progression   Height: 5\' 10"  (177.8 cm) Weight: (!) 315 lb 11.2 oz (143.2 kg) IBW/kg (Calculated) : 73  Temp (24hrs), Avg:98.7 F (37.1 C), Min:97.7 F (36.5 C), Max:99.4 F (37.4 C)  Recent Labs  Lab 07/17/19 0532 07/19/19 0004 07/19/19 2110 07/20/19 0356 07/20/19 1530 07/20/19 1545 07/21/19 0230  WBC 7.2  --  5.0 14.6*  --   --  21.3*  CREATININE 0.78  --  1.43* 1.45* 1.23  --  1.02  LATICACIDVEN  --  4.6* 7.1* 4.8*  --  3.6*  --     Estimated Creatinine Clearance: 107.4 mL/min (by C-G formula based on SCr of 1.02 mg/dL).    No Known Allergies  Antimicrobials this admission: Cefazolin 10/3 >> Ceftriaxone x1 on 10/2 Cefepime 9/30 >> 10/1 Metronidazole x1 on 10/1 Vancomycin x1 on 9/30  Dose adjustments this admission: N/A  Microbiology results: 9/30 covid - negative  9/30 UCx - K pneumoniae (sensitive CTX, cefazolin; R amp + nitrofurantoin) 9/30 BCx - Kleb pneumoniae 9/20 BCID - Kleb pneumoniae 9/30 resp panel - positive for rhinovirus/enterovirus    10/1 MRSA PCR - negative  10/1 resp culture - rare GPC  Thank you for allowing pharmacy to be a part of this patient's care.  Cristela Felt, PharmD PGY1 Pharmacy Resident [employer]: [phone]   07/21/2019 1:29 PM

## 2019-07-21 NOTE — Progress Notes (Signed)
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 Trach decannulation 9/22 and discharged to [facility] 9/28.. Presented to [facility] 9/30 with dyspnea, productive cough, dysuria found to have septic shock with UTI/ aspiration pneumonia. In the ED, cultures collected, given >3.5L NS, started on Cefepime, flagyl, and vancomycin. Patient respiratory and circulatory system continued to worsen so was intubated and started on norepinephrine. Patient status in ICU has continued to improve with antibiotic treatment and resp/circ support and patient is going to be extubated 10/2.  History of present illness   63 year old male with PMH as below, presented from SNF 9/30 with loss of consciousness and respiratory distress with weakness and fevers. Upon arrival to the ED he was found to be significantly hypotensive with SBP in 70s. Workup concerning for HCAP +/- UTI. Lactic acid 7 he was resuscitated with 4.5L IVF and started on levophed. Empiric antibiotics started. His respiratory status declined and he was placed on BiPAP. PCCM admitted and patient was then intubated for fatigue from work of breathing.  Past Medical History  has a past medical history of Diabetes ([place]). CVA  Significant Hospital Events   10/1- central line placed 10/1- intubated 10/2- exctubated  Consults:    Procedures:    Significant Diagnostic Tests:    Micro Data:  Blood 9/30 > 1/2 blood Cx kleb pna Urine 9/30 > 100k GNR Tracheal aspirate 9/30 > rare GPC and PMN predominance RVP 10/1 > positive for rhinovirus/ enterovirus Strep urinary antigen 10/1 Legionella 10/1  Antimicrobials:  Cefepime 9/30 >10/1 Vancomycin 9/30 >10/1 Flagyl 9/30 >10/1  CTX 10/2>  Interim history/subjective:  Patient has  significant improvement today clinically. He was alert to voice, waved, able to follow simple commands, denied pain, indicated he would like breathing tube removed. Blood pressure low but stable and levophed continuously weaned- currently at 69mg with systolic low 1235TKidney function appears stable. Mild hypokalemia and phos so will replace with kphos today per tube. Also going to attempt to extubate today.  Objective   Blood pressure 103/64, pulse 92, temperature 98.6 F (37 C), temperature source Oral, resp. rate (!) 24, height _0  (1.778 m), weight (!) 143.2 kg, SpO2 97 %.    Vent Mode: PRVC FiO2 (%):  [40 %-70 %] 40 % Set Rate:  [24 bmp] 24 bmp Vt Set:  [580 mL] 580 mL PEEP:  [5 cmH20] 5 cmH20 Plateau Pressure:  [19 cmH20-22 cmH20] 22 cmH20   Intake/Output Summary (Last 24 hours) at 07/21/2019 0744 Last data filed at 07/21/2019 0700 Gross per 24 hour  Intake 2290.64 ml  Output 1800 ml  Net 490.64 ml   Filed Weights   07/19/19 2204 07/20/19 0400 07/21/19 0401  Weight: (!) 141 kg (!) 143.2 kg (!) 143.2 kg    Examination: General: Resting comfortably, eyes open to voice.  HENT: Left IJ in place without signs of infection Lungs: Good breath sounds auscultated bilateral bases with diffuse stertor and diffuse, mild inspiratory wheezing. Cardiovascular: Mildly tachycardic, regular rhythm, negative for murmurs Abdomen: Soft and obese, does not appear to be tender to palpation.  PEG tube in place Extremities: Thick dry skin in lower extremities with diffuse 1+ pitting edema in extremities Neuro: Eyes closed, alert to verbal stimuli, pupils reactive to light  bilaterally GU: Red urine output  Resolved Hospital Problem list   Hypotension  Assessment & Plan:  Septic shock resolved #Sepsis improving-likely urine source -urine culture showing GNR. -LA improved 4.6>7.1>4.8 yesterday. Last known fever 102.3 at 2110 9/30. 98.6 this am. HR improving to 90s. BCx 1/2 growing klebsiella -  RVP positive for rhinovirus/enterovirus and droplet precautions removed. - levophed on 25mg currently, continue to wean today as tolerated with MAP >65 - cefepime (9/30-10/1) - CTX (10/1-) - f/u BCx, UCx, urine legionella  # Respiratory failure-improving spontaneous breathing on vent- plan to extubate today. Exam shows mild diffuse inspiratory wheezing - pulmicort BID -Fentanyl discontinued today and as needed lorazepam  -ABG showed mild hyperventilation  # AKI: Cr improved today to 1.02 with normal GFR and increasing BUN to 30 from 25. Urine output 1.1L yesterday.  -Continue to monitor urine output - replace kphos today per pharm - BMET daily - could consider increasing finasteride to 162m- recommend referring to urology outpatient  # DM: glucose range 111-196 - MSSI q4hr  #h/o CVA: - PT/OT evaluate once recovered  # Transaminitis: - repeat liver function panel today shows improved AST 98>82, ALT increased 69>82. Alk phos resolved.  - consider discontinuing statin  Best practice:  Diet:  Continuous PEG tube feedings per nutrition Pain/Anxiety/Delirium protocol (if indicated):  tylenol, Fentanyl and lorazepam as needed  VAP protocol (if indicated): YES DVT prophylaxis: SQH GI prophylaxis: Famotidine, dulcolax Glucose control: mSSI Mobility: BR Code Status: FULL Family Communication: Wife updated 10/1 in room with patient Disposition: ICU  Labs   CBC: Recent Labs  Lab 07/17/19 0532 07/19/19 2110 07/20/19 0356 07/20/19 1356 07/21/19 0230  WBC 7.2 5.0 14.6*  --  21.3*  NEUTROABS  --  4.8  --   --   --   HGB 12.0* 13.7 11.1* 11.2* 10.3*  HCT 35.1* 43.2 34.4* 33.0* 30.4*  MCV 90.5 95.6 95.0  --  93.0  PLT 236 209 207  --  17659  Basic Metabolic Panel: Recent Labs  Lab 07/17/19 0532 07/19/19 2110 07/20/19 0356 07/20/19 1356 07/20/19 1530 07/20/19 1840 07/21/19 0230  NA 139 136 138 140 140  --  137  K 4.1 3.4* 4.0 4.2 4.6  --  3.4*  CL 101 100 106  --   108  --  107  CO2 28 20* 18*  --  20*  --  20*  GLUCOSE 123* 173* 177*  --  128*  --  188*  BUN 14 22 25*  --  25*  --  30*  CREATININE 0.78 1.43* 1.45*  --  1.23  --  1.02  CALCIUM 8.9 8.8* 7.6*  --  7.8*  --  7.4*  MG  --   --   --   --   --  1.6* 1.8  PHOS  --   --   --   --   --  3.0 1.4*   GFR: Estimated Creatinine Clearance: 107.4 mL/min (by C-G formula based on SCr of 1.02 mg/dL). Recent Labs  Lab 07/17/19 0532 07/19/19 0004 07/19/19 2110 07/20/19 0356 07/20/19 1545 07/21/19 0230  WBC 7.2  --  5.0 14.6*  --  21.3*  LATICACIDVEN  --  4.6* 7.1* 4.8* 3.6*  --     Liver Function Tests: Recent Labs  Lab 07/19/19 2110 07/21/19 0230  AST 98* 53*  ALT 69* 82*  ALKPHOS 172* 93  BILITOT 1.1 0.8  PROT 7.7 5.7*  ALBUMIN 3.2* 2.0*  No results for input(s): LIPASE, AMYLASE in the last 168 hours. No results for input(s): AMMONIA in the last 168 hours.  ABG    Component Value Date/Time   PHART 7.457 (H) 07/20/2019 1356   PCO2ART 26.1 (L) 07/20/2019 1356   PO2ART 153.0 (H) 07/20/2019 1356   HCO3 18.4 (L) 07/20/2019 1356   TCO2 19 (L) 07/20/2019 1356   ACIDBASEDEF 4.0 (H) 07/20/2019 1356   O2SAT 100.0 07/20/2019 1356     Coagulation Profile: Recent Labs  Lab 07/19/19 2110  INR 1.5*   HbA1C: Hgb A1c MFr Bld  Date/Time Value Ref Range Status  05/30/2019 11:22 AM 10.6 (H) 4.8 - 5.6 % Final    Comment:    (NOTE) Pre diabetes:          5.7%-6.4% Diabetes:              >6.4% Glycemic control for   <7.0% adults with diabetes     CBG: Recent Labs  Lab 07/20/19 1127 07/20/19 1453 07/20/19 1920 07/20/19 2340 07/21/19 0350  GLUCAP 165* 131* 111* 173* 172*    Review of Systems:   Unable to obtain  Past Medical History  He,  has a past medical history of Diabetes ([place]).  CVA  Surgical History    Past Surgical History:  Procedure Laterality Date  . CHOLECYSTECTOMY    . IR GASTROSTOMY TUBE MOD SED  07/04/2019    Social History   reports that he has  never smoked. He has never used smokeless tobacco. He reports current alcohol use. He reports that he does not use drugs.   Family History   His family history is not on file.   Allergies No Known Allergies   Home Medications  Prior to Admission medications   Medication Sig Start Date End Date Taking? Authorizing Provider  acetaminophen (TYLENOL) 325 MG tablet Place 1-2 tablets (325-650 mg total) into feeding tube every 4 (four) hours as needed for mild pain. 07/17/19   Love, Ivan Anchors, PA-C  Amino Acids-Protein Hydrolys (FEEDING SUPPLEMENT, PRO-STAT SUGAR FREE 64,) LIQD Place 30 mLs into feeding tube 2 (two) times daily. 07/17/19   Love, Ivan Anchors, PA-C  amLODipine (NORVASC) 10 MG tablet Place 1 tablet (10 mg total) into feeding tube daily. 07/17/19   Love, Ivan Anchors, PA-C  aspirin 81 MG chewable tablet Place 1 tablet (81 mg total) into feeding tube daily. 06/28/19   Debbe Odea, MD  atorvastatin (LIPITOR) 40 MG tablet Place 1 tablet (40 mg total) into feeding tube daily at 6 PM. 07/17/19   Love, Ivan Anchors, PA-C  bethanechol (URECHOLINE) 25 MG tablet Place 1 tablet (25 mg total) into feeding tube 4 (four) times daily. 07/17/19   Love, Ivan Anchors, PA-C  blood glucose meter kit and supplies KIT Dispense based on patient and insurance preference. Use up to four times daily as directed. (FOR ICD-9 250.00, 250.01). 07/17/19   Love, Ivan Anchors, PA-C  budesonide (PULMICORT) 0.25 MG/2ML nebulizer solution Take 2 mLs (0.25 mg total) by nebulization 2 (two) times daily. 07/17/19   Love, Ivan Anchors, PA-C  citalopram (CELEXA) 10 MG tablet Place 1 tablet (10 mg total) into feeding tube daily. 07/18/19   Love, Ivan Anchors, PA-C  clopidogrel (PLAVIX) 75 MG tablet Place 1 tablet (75 mg total) into feeding tube daily. 07/17/19   Love, Ivan Anchors, PA-C  famotidine (PEPCID) 40 MG/5ML suspension Place 2.5 mLs (20 mg total) into feeding tube 2 (two) times daily. 07/17/19   Love, Ivan Anchors,  PA-C  finasteride (PROSCAR) 5 MG tablet Put in  tube 07/17/19   Love, Ivan Anchors, PA-C  glycopyrrolate (ROBINUL) 1 MG tablet Place 2 tablets (2 mg total) into feeding tube 2 (two) times daily. 07/17/19   Love, Ivan Anchors, PA-C  guaiFENesin-dextromethorphan (ROBITUSSIN DM) 100-10 MG/5ML syrup Place 15 mLs into feeding tube every 6 (six) hours. 07/17/19   Love, Ivan Anchors, PA-C  Insulin Glargine (LANTUS) 100 UNIT/ML Solostar Pen Inject 33 Units into the skin daily. 07/17/19   Love, Ivan Anchors, PA-C  Insulin Pen Needle (PEN NEEDLES) 32G X 6 MM MISC 1 application by Does not apply route daily. 07/17/19   Love, Ivan Anchors, PA-C  mouth rinse LIQD solution 15 mLs by Mouth Rinse route 5 (five) times daily. 07/17/19   Love, Ivan Anchors, PA-C  Nutritional Supplements (FEEDING SUPPLEMENT, OSMOLITE 1.5 CAL,) LIQD Place 474 mLs into feeding tube 3 (three) times daily with meals. 07/17/19   Love, Ivan Anchors, PA-C  QUEtiapine (SEROQUEL) 100 MG tablet Place 1 tablet (100 mg total) into feeding tube at bedtime. 07/17/19   Love, Ivan Anchors, PA-C  tamsulosin (FLOMAX) 0.4 MG CAPS capsule Open capsule and administer medication via PEG tube 07/17/19   Love, Ivan Anchors, PA-C  Water For Irrigation, Sterile (FREE WATER) SOLN Place 200 mLs into feeding tube 4 (four) times daily -  with meals and at bedtime. Use filtered or bottled water (not distilled water) 07/17/19   Love, Ivan Anchors, PA-C     Critical care time:**    Doristine Mango, DO Family medicine PGY 2

## 2019-07-21 NOTE — Evaluation (Signed)
Physical Therapy Evaluation Patient Details Name: Michael Bright MRN: [id number] DOB: [birth year] Today's Date: 07/21/2019   History of Present Illness  63yM with DM, recent left medullary CVA complicated with trach (8/41) and g-tube placement. Trach decannulation 9/22 and discharged to CIR, then home on 9/28. Presented to [facility] 9/30 with dyspnea, productive cough, dysuria found to have septic shock with UTI/ aspiration pneumonia.Extubated 10/2.  Clinical Impression  Patient presents with decreased independence with mobility due to generalized weakness, decreased balance and decreased cardiorespiratory reserve.  Patient needing mod to max A of 2 for EOB activity not well tolerated.  Was home couple of days from CIR ambulating with RW on his own with some assist for transfers.  Feel he will hopefully progress and be able to return home at d/c and resume HHPT.  If pt doesn't progress with adjust recommendations.  PT to follow acutely.     Follow Up Recommendations Supervision/Assistance - 24 hour;Home health PT    Equipment Recommendations  None recommended by PT    Recommendations for Other Services       Precautions / Restrictions Precautions Precautions: Fall Precaution Comments: PEG, high RR on 4L O2 Other Brace: L AFO Restrictions Weight Bearing Restrictions: No      Mobility  Bed Mobility Overal bed mobility: Needs Assistance Bed Mobility: Supine to Sit;Sit to Supine     Supine to sit: Mod assist;HOB elevated Sit to supine: +2 for safety/equipment;Max assist   General bed mobility comments: assist to guide legs off bed and to lift trunk, scoot hips; RN in to help for sit to supine for trunk and legs and scooting to [facility]  Transfers                 General transfer comment: NT RR 40's seated EOB on 4L SpO2 94%; RN Advised dangle at the most  Ambulation/Gait                Stairs            Wheelchair Mobility    Modified Rankin (Stroke Patients  Only)       Balance Overall balance assessment: Needs assistance Sitting-balance support: Feet supported;Single extremity supported Sitting balance-Leahy Scale: Poor Sitting balance - Comments: minguard for safety due to some mild L lateral lean; sat EOB about 5 minutes eyes open part of the time                                     Pertinent Vitals/Pain Pain Assessment: No/denies pain    Home Living Family/patient expects to be discharged to:: Private residence Living Arrangements: Spouse/significant other Available Help at Discharge: Family;Available 24 hours/day Type of Home: House Home Access: Ramped entrance   Entrance Stairs-Number of Steps: 4 stairs Home Layout: Two level;Able to live on main level with bedroom/bathroom Home Equipment: Gilford Rile - 2 wheels;Tub bench      Prior Function Level of Independence: Independent with assistive device(s)         Comments: was home for two days walking with RW on his own, assist for socks, shoes, showering with HHshower and tub bench     Hand Dominance        Extremity/Trunk Assessment   Upper Extremity Assessment Upper Extremity Assessment: LUE deficits/detail LUE Deficits / Details: AAROM WFL, strength shoulder flexion 2-/5, elbow flexion 4/5, elbow extension 3+/5 LUE Sensation: WNL    Lower Extremity Assessment Lower  Extremity Assessment: Generalized weakness(moving legs in bed and antigravity but not formally tested)       Communication   Communication: Other (comment)(raspy voice recent extubation)  Cognition Arousal/Alertness: Lethargic Behavior During Therapy: Flat affect Overall Cognitive Status: Impaired/Different from baseline Area of Impairment: Orientation;Following commands;Attention                 Orientation Level: Disoriented to;Situation;Time Current Attention Level: Sustained   Following Commands: Follows one step commands consistently;Follows one step commands with  increased time       General Comments: lethargic      General Comments General comments (skin integrity, edema, etc.): edema noted in LE's/feet    Exercises     Assessment/Plan    PT Assessment Patient needs continued PT services  PT Problem List Decreased strength;Decreased activity tolerance;Decreased balance;Decreased mobility;Cardiopulmonary status limiting activity;Decreased knowledge of use of DME       PT Treatment Interventions DME instruction;Therapeutic activities;Balance training;Patient/family education;Therapeutic exercise;Functional mobility training;Gait training    PT Goals (Current goals can be found in the Care Plan section)  Acute Rehab PT Goals Patient Stated Goal: to return home PT Goal Formulation: With patient/family Time For Goal Achievement: 08/04/19 Potential to Achieve Goals: Good    Frequency Min 3X/week   Barriers to discharge        Co-evaluation               AM-PAC PT "6 Clicks" Mobility  Outcome Measure Help needed turning from your back to your side while in a flat bed without using bedrails?: A Lot Help needed moving from lying on your back to sitting on the side of a flat bed without using bedrails?: A Lot Help needed moving to and from a bed to a chair (including a wheelchair)?: Total Help needed standing up from a chair using your arms (e.g., wheelchair or bedside chair)?: Total Help needed to walk in hospital room?: Total Help needed climbing 3-5 steps with a railing? : Total 6 Click Score: 8    End of Session Equipment Utilized During Treatment: Oxygen Activity Tolerance: Treatment limited secondary to medical complications (Comment)(RR in 40's) Patient left: in bed;with call bell/phone within reach;with family/visitor present   PT Visit Diagnosis: Muscle weakness (generalized) (M62.81);Other abnormalities of gait and mobility (R26.89)    Time: 1515-1540 PT Time Calculation (min) (ACUTE ONLY): 25 min   Charges:    PT Evaluation $PT Eval Moderate Complexity: 1 Mod PT Treatments $Therapeutic Activity: 8-22 mins        Sheran Lawless, [place] [facility] [phone] 07/21/2019   Elray Mcgregor 07/21/2019, 3:57 PM

## 2019-07-22 ENCOUNTER — Inpatient Hospital Stay (HOSPITAL_COMMUNITY): Payer: BC Managed Care – PPO

## 2019-07-22 DIAGNOSIS — J189 Pneumonia, unspecified organism: Secondary | ICD-10-CM

## 2019-07-22 LAB — GLUCOSE, CAPILLARY
Glucose-Capillary: 143 mg/dL — ABNORMAL HIGH (ref 70–99)
Glucose-Capillary: 150 mg/dL — ABNORMAL HIGH (ref 70–99)
Glucose-Capillary: 162 mg/dL — ABNORMAL HIGH (ref 70–99)
Glucose-Capillary: 164 mg/dL — ABNORMAL HIGH (ref 70–99)
Glucose-Capillary: 173 mg/dL — ABNORMAL HIGH (ref 70–99)
Glucose-Capillary: 179 mg/dL — ABNORMAL HIGH (ref 70–99)

## 2019-07-22 LAB — BASIC METABOLIC PANEL
Anion gap: 6 (ref 5–15)
BUN: 28 mg/dL — ABNORMAL HIGH (ref 8–23)
CO2: 22 mmol/L (ref 22–32)
Calcium: 7.8 mg/dL — ABNORMAL LOW (ref 8.9–10.3)
Chloride: 113 mmol/L — ABNORMAL HIGH (ref 98–111)
Creatinine, Ser: 0.71 mg/dL (ref 0.61–1.24)
GFR calc Af Amer: >60 mL/min (ref >60)
GFR calc non Af Amer: >60 mL/min (ref >60)
Glucose, Bld: 187 mg/dL — ABNORMAL HIGH (ref 70–99)
Potassium: 4 mmol/L (ref 3.5–5.1)
Sodium: 141 mmol/L (ref 135–145)

## 2019-07-22 LAB — CBC
HCT: 32.5 % — ABNORMAL LOW (ref 39.0–52.0)
Hemoglobin: 10.8 g/dL — ABNORMAL LOW (ref 13.0–17.0)
MCH: 30.9 pg (ref 26.0–34.0)
MCHC: 33.2 g/dL (ref 30.0–36.0)
MCV: 92.9 fL (ref 80.0–100.0)
Platelets: 173 10*3/uL (ref 150–400)
RBC: 3.5 MIL/uL — ABNORMAL LOW (ref 4.22–5.81)
RDW: 12.8 % (ref 11.5–15.5)
WBC: 22 10*3/uL — ABNORMAL HIGH (ref 4.0–10.5)
nRBC: 0 % (ref 0.0–0.2)

## 2019-07-22 LAB — CULTURE, RESPIRATORY W GRAM STAIN

## 2019-07-22 MED ORDER — VITAL HIGH PROTEIN PO LIQD
1000.0000 mL | ORAL | Status: DC
  Administered 2019-07-22 – 2019-07-23 (×3): 1000 mL
  Filled 2019-07-22 (×2): qty 1000

## 2019-07-22 MED ORDER — ORAL CARE MOUTH RINSE
15.0000 mL | Freq: Two times a day (BID) | OROMUCOSAL | Status: DC
  Administered 2019-07-22 – 2019-07-26 (×10): 15 mL via OROMUCOSAL

## 2019-07-22 MED ORDER — CHLORHEXIDINE GLUCONATE 0.12 % MT SOLN
15.0000 mL | Freq: Two times a day (BID) | OROMUCOSAL | Status: DC
  Administered 2019-07-22 – 2019-07-26 (×9): 15 mL via OROMUCOSAL
  Filled 2019-07-22 (×7): qty 15

## 2019-07-22 NOTE — Progress Notes (Signed)
ICU Transitions of Care Pharmacist Intervention    Michael Bright is a 63 y.o. male admitted on 07/19/2019  from home, and has been in the intensive care unit for 2d 5h.  Medical History: Past Medical History:  Diagnosis Date  . Diabetes ([place])      Home Medication List:  Medications Prior to Admission  Medication Sig Dispense Refill Last Dose  . acetaminophen (TYLENOL) 325 MG tablet Place 1-2 tablets (325-650 mg total) into feeding tube every 4 (four) hours as needed for mild pain.   Past Week at Unknown time  . Amino Acids-Protein Hydrolys (FEEDING SUPPLEMENT, PRO-STAT SUGAR FREE 64,) LIQD Place 30 mLs into feeding tube 2 (two) times daily. 887 mL 0 07/19/2019 at Unknown time  . amLODipine (NORVASC) 10 MG tablet Place 1 tablet (10 mg total) into feeding tube daily. 30 tablet 1 07/19/2019 at Unknown time  . aspirin 81 MG chewable tablet Place 1 tablet (81 mg total) into feeding tube daily.   07/19/2019 at Unknown time  . atorvastatin (LIPITOR) 40 MG tablet Place 1 tablet (40 mg total) into feeding tube daily at 6 PM. 30 tablet 0 07/18/2019  . bethanechol (URECHOLINE) 25 MG tablet Place 1 tablet (25 mg total) into feeding tube 4 (four) times daily. 120 tablet 0 07/19/2019 at Unknown time  . budesonide (PULMICORT) 0.25 MG/2ML nebulizer solution Take 2 mLs (0.25 mg total) by nebulization 2 (two) times daily. 60 mL 1 07/19/2019 at Unknown time  . citalopram (CELEXA) 10 MG tablet Place 1 tablet (10 mg total) into feeding tube daily. 30 tablet 0 07/19/2019 at Unknown time  . clopidogrel (PLAVIX) 75 MG tablet Place 1 tablet (75 mg total) into feeding tube daily. 30 tablet 0 07/19/2019 at Unknown time  . famotidine (PEPCID) 40 MG/5ML suspension Place 2.5 mLs (20 mg total) into feeding tube 2 (two) times daily. 200 mL 0 07/19/2019 at Unknown time  . glycopyrrolate (ROBINUL) 1 MG tablet Place 2 tablets (2 mg total) into feeding tube 2 (two) times daily. 120 tablet 0 07/19/2019 at Unknown time  .  guaiFENesin-dextromethorphan (ROBITUSSIN DM) 100-10 MG/5ML syrup Place 15 mLs into feeding tube every 6 (six) hours. 473 mL 0 07/19/2019 at Unknown time  . Insulin Glargine (BASAGLAR KWIKPEN [place]) Inject 33 Units into the skin daily.   07/19/2019 at Unknown time  . mouth rinse LIQD solution 15 mLs by Mouth Rinse route 5 (five) times daily. 946 mL 1 07/19/2019 at Unknown time  . Nutritional Supplements (FEEDING SUPPLEMENT, OSMOLITE 1.5 CAL,) LIQD Place 474 mLs into feeding tube 3 (three) times daily with meals.  0 07/19/2019 at Unknown time  . QUEtiapine (SEROQUEL) 100 MG tablet Place 1 tablet (100 mg total) into feeding tube at bedtime. 30 tablet 0 07/18/2019  . tamsulosin (FLOMAX) 0.4 MG CAPS capsule Open capsule and administer medication via PEG tube 30 capsule 0 07/19/2019 at Unknown time  . Water For Irrigation, Sterile (FREE WATER) SOLN Place 200 mLs into feeding tube 4 (four) times daily -  with meals and at bedtime. Use filtered or bottled water (not distilled water)   07/19/2019 at Unknown time  . blood glucose meter kit and supplies KIT Dispense based on patient and insurance preference. Use up to four times daily as directed. (FOR ICD-9 250.00, 250.01). 1 each 0   . finasteride (PROSCAR) 5 MG tablet Put in tube (Patient not taking: Reported on 07/20/2019) 30 tablet 0 Not Taking at Unknown time  . Insulin Glargine (LANTUS) 100 UNIT/ML Solostar  Pen Inject 33 Units into the skin daily. (Patient not taking: Reported on 07/20/2019) 15 mL 11 Not Taking at Unknown time  . Insulin Pen Needle (PEN NEEDLES) 32G X 6 MM MISC 1 application by Does not apply route daily. 100 each 0      Has a pharmacist made an intervention on this patient's medication list?  yes, because the patient stayed in medical-surgical ICU for > 48 hours, is discharging from ICU to another level of care within [facility], and has active orders for medications that should be discontinued upon transition out of ICU.    Transitions of care  were discussed with Dr Elsworth Soho and the following interventions were made:  Antibiotics: Antibiotic was started during ICU admission and not intended to be continued at discharge. a stop date was added. Cefazolin duration for total 14 d - can consider change to oral abx after transfer from ICU  [name], PharmD, BCPS, BCCCP Clinical Pharmacist [phone]  Please check AMION for all [place] numbers  07/22/2019 10:04 AM

## 2019-07-22 NOTE — Progress Notes (Signed)
Interval PCCM note:  07/22/19 Wife called and updated on condition and transfer to [place] NP-C  [facility]  [phone]  07/22/2019

## 2019-07-22 NOTE — Evaluation (Signed)
Clinical/Bedside Swallow Evaluation Patient Details  Name: Michael Bright MRN: [id number] Date of Birth: April 09, 1956  Today's Date: 07/22/2019 Time: SLP Start Time (ACUTE ONLY): 9323 SLP Stop Time (ACUTE ONLY): 0940 SLP Time Calculation (min) (ACUTE ONLY): 35 min  Past Medical History:  Past Medical History:  Diagnosis Date  . Diabetes [facility])    Past Surgical History:  Past Surgical History:  Procedure Laterality Date  . CHOLECYSTECTOMY    . 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 Trach decannulation 9/22 and discharged to [facility] 9/28.. Presented to [facility] 9/30 with dyspnea, productive cough, dysuria found to have septic shock with UTI/ aspiration pneumonia. Extubated 10/2   Assessment / Plan / Recommendation Clinical Impression  Pt known to ST services, having been on CIR recently. Pt was seen at bedside for swallow assessment, however, he has a history of significant dysphagia s/p medullary stroke. Pt has PEG tube. Oral care was completed with suction. Pt has adequate natural dentition, speech is intelligible. Voice quality is wet/breathy/hoarse at baseline. Volitional cough is congested and nonproductive.   After oral care, pt was given individual ice chips. Adequate oral prep noted, however, pt exhibited immediate cough response after the swallow. Pt then reported "Nothing's different". Pt verbalized recall of swallowing exercises given while on CIR. SLP provided written exercises, and encouraged pt to complete them throughout the day (when watching TV, complete exercises during commercial breaks). Pt is motivated to improve, however, he seems aware of the severity of his dysphagia. Pt underwent MBS 07/13/2019, which recommended NPO status and continued PEG tube feeds. Repeat MBS is not recommended at this time, as significant change in swallow function is not anticipated. SLP will follow acutely  to facilitate ongoing completion of exercise program to maximize swallow function and safety.  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 Trach decannulation 9/22 and discharged to [facility] 9/28.. Presented to [facility] 9/30 with dyspnea, productive cough, dysuria found to have septic shock with UTI/ aspiration pneumonia. Extubated 10/2 Type of Study: Bedside Swallow Evaluation Previous Swallow Assessment: MBS 07/13/2019 - NPO severe pharyngeal dysphagia Diet Prior to this Study: NPO;PEG tube Temperature Spikes Noted: No Respiratory Status: Nasal cannula Trach Size and Type: Other (Comment)(decannulated 07/11/2019) History of Recent Intubation: Yes Length of Intubations (days): 3 days Date extubated: 07/21/19 Behavior/Cognition: Alert;Cooperative;Pleasant mood Oral Care Completed by SLP: Yes Oral Cavity - Dentition: Adequate natural dentition Vision: Functional for self-feeding Self-Feeding Abilities: Able to feed self;Needs assist Patient Positioning: Upright in bed Baseline Vocal Quality: Wet;Breathy;Low vocal intensity;Hoarse Volitional Cough: Weak;Congested(non productive) Volitional Swallow: Able to elicit    Oral/Motor/Sensory Function Overall Oral Motor/Sensory Function: Within functional limits   Ice Chips Ice chips: Impaired Presentation: Spoon Pharyngeal Phase  Impairments: Suspected delayed Swallow;Cough - Immediate;Wet Vocal Quality  Thin Liquid Thin Liquid: Not tested    Nectar Thick Nectar Thick Liquid: Not tested   Honey Thick Honey Thick Liquid: Not tested   Puree Puree: Not tested   Solid     Solid: Not tested     Celia B. Quentin Ore, [facility] [place] Speech Language Pathologist Office: [phone] Pager: [phone]  Shonna Chock 07/22/2019,9:51 AM

## 2019-07-22 NOTE — Progress Notes (Signed)
NAME:  Michael Bright, MRN:  [id number], DOB:  07-23-1956, LOS: 2 ADMISSION DATE:  07/19/2019, CONSULTATION DATE:  9/30 REFERRING MD:  ED, CHIEF COMPLAINT:  Septic shock   Brief History   63yM with DM, recent left medullary CVA complicated with trach (3/22) and g-tube placement. Trach decannulation 9/22 and discharged to [facility] 9/28.. Presented to [facility] 9/30 with dyspnea, productive cough, dysuria found to have septic shock with UTI/ aspiration pneumonia. In the ED, cultures collected, given >3.5L NS, started on Cefepime, flagyl, and vancomycin. Patient respiratory and circulatory system continued to worsen so was intubated and started on norepinephrine. Patient status in ICU has continued to improve with antibiotic treatment and resp/circ support and extubation on 10/2.   History of present illness   63 year old male with PMH as below, presented from SNF 9/30 with loss of consciousness and respiratory distress with weakness and fevers. Upon arrival to the ED he was found to be significantly hypotensive with SBP in 70s. Workup concerning for HCAP +/- UTI. Lactic acid 7 he was resuscitated with 4.5L IVF and started on levophed. Empiric antibiotics started. His respiratory status declined and he was placed on BiPAP. PCCM admitted and patient was then intubated for fatigue from work of breathing.  Past Medical History  has a past medical history of Diabetes ([place]). CVA  Significant Hospital Events   10/1- central line placed 10/1- intubated 10/2- extubated   Consults:    Procedures:    Significant Diagnostic Tests:  Renal US 10/2 > normal   Micro Data:  Blood 9/30 > 1/2 blood Cx kleb pna Urine 9/30 > 100k GNR>Klebsiella Pna  Tracheal aspirate 9/30 > rare GPC and PMN predominance RVP 10/1 > positive for rhinovirus/ enterovirus Strep urinary antigen 10/1 NEG  Legionella 10/1 NEG   Antimicrobials:  Cefepime 9/30 >10/1 Vancomycin 9/30 >10/1 Flagyl 9/30 >10/1  CTX 10/2>10/2  Cefazolin  10/3 >>   Interim history/subjective:  Awake and alert , good conversation , oriented.  Able to lift bilateral arms off bed , wiggle toes.  Voice hoarse and weak cough. No stridor. Good phonation No increased wob and good sats since extubation 10/2.    Objective   Blood pressure 122/67, pulse 86, temperature 98.7 F (37.1 C), temperature source Axillary, resp. rate (!) 29, height '5\' 10"'$  (1.778 m), weight (!) 142.2 kg, SpO2 93 %.        Intake/Output Summary (Last 24 hours) at 07/22/2019 0818 Last data filed at 07/22/2019 0600 Gross per 24 hour  Intake 2272.3 ml  Output 1290 ml  Net 982.3 ml   Filed Weights   07/20/19 0400 07/21/19 0401 07/22/19 0357  Weight: (!) 143.2 kg (!) 143.2 kg (!) 142.2 kg    Examination: General: Alert and Oriented, NAD   HENT: Voice hoarse , dry mucosa , [place]/NT , no stridor  Lungs: bilateral BS , no wheezing , speaks in full sentences . Trace rhonchi  Cardiovascular: RRR , no mrg , 1+ edema  Abdomen: Soft and obese, NT , BS +   PEG tube in place w/ TF  Extremities: Thick dry skin in lower extremities with diffuse 1+ pitting edema in extremities Neuro: A.O x 3 , MAE , general weakness/deconditioning  GU:  Foley   Resolved Hospital Problem list   Hypotension Septic Shock -off pressor 10/2  Assessment & Plan:  #Sepsis improving-Klebsiella Bacteremia and UTI  -LA improved 4.6>7.1>4.8 >3.6  Last known fever 102.3 at 2110 9/30.  Remains afebrile .  -  RVP positive for rhinovirus/enterovirus and droplet precautions removed.  Plan  Continue on ABX  Monitor wbc and temp  Follow sputum cx final   # Respiratory failure-improved . Extubated 10/2  Bilateral ASPDZ Lurena Nida  - pulmicort BID  -Cont ABX  -check Chest xray today  -mobilize as able  -IS  -Add Flutter   # AKI: Much improved scr tr down 0.71 . Renal US normal  Urine output 1.1L yesterday with good UOP  #Hypophos #Hypomag #Hypokalemia  -Continue to monitor urine output - kphos 10/2 , check  phos/mg  in am  - BMET in am  - could consider increasing finasteride to '10mg'$  - recommend referring to urology outpatient  # DM: glucose range 111-196 - MSSI q4hr  #h/o CVA: - PT/OT evaluate once recovered  # Transaminitis: -repeat liver function panel today shows improved AST 98>82, ALT increased 69>82. Alk phos resolved.  - may need to consider discontinuing statin -tr LFTs  Best practice:  Diet:  Continuous PEG tube feedings per nutrition Pain/Anxiety/Delirium protocol (if indicated):  tylenol,  VAP protocol (if indicated): Na  DVT prophylaxis: SQH GI prophylaxis: Famotidine, dulcolax Glucose control: mSSI Mobility: BR Code Status: FULL Family Communication: Wife updated 10/1 in room with patient Disposition: ICU  Labs   CBC: Recent Labs  Lab 07/17/19 0532 07/19/19 2110 07/20/19 0356 07/20/19 1356 07/21/19 0230 07/22/19 0235  WBC 7.2 5.0 14.6*  --  21.3* 22.0*  NEUTROABS  --  4.8  --   --   --   --   HGB 12.0* 13.7 11.1* 11.2* 10.3* 10.8*  HCT 35.1* 43.2 34.4* 33.0* 30.4* 32.5*  MCV 90.5 95.6 95.0  --  93.0 92.9  PLT 236 209 207  --  177 518    Basic Metabolic Panel: Recent Labs  Lab 07/20/19 0356 07/20/19 1356 07/20/19 1530 07/20/19 1840 07/21/19 0230 07/21/19 1601 07/22/19 0235  NA 138 140 140  --  137 141 141  K 4.0 4.2 4.6  --  3.4* 4.0 4.0  CL 106  --  108  --  107 110 113*  CO2 18*  --  20*  --  20* 24 22  GLUCOSE 177*  --  128*  --  188* 164* 187*  BUN 25*  --  25*  --  30* 29* 28*  CREATININE 1.45*  --  1.23  --  1.02 0.75 0.71  CALCIUM 7.6*  --  7.8*  --  7.4* 7.9* 7.8*  MG  --   --   --  1.6* 1.8  --   --   PHOS  --   --   --  3.0 1.4*  --   --    GFR: Estimated Creatinine Clearance: 136.4 mL/min (by C-G formula based on SCr of 0.71 mg/dL). Recent Labs  Lab 07/19/19 0004 07/19/19 2110 07/20/19 0356 07/20/19 1545 07/21/19 0230 07/22/19 0235  WBC  --  5.0 14.6*  --  21.3* 22.0*  LATICACIDVEN 4.6* 7.1* 4.8* 3.6*  --   --      Liver Function Tests: Recent Labs  Lab 07/19/19 2110 07/21/19 0230  AST 98* 53*  ALT 69* 82*  ALKPHOS 172* 93  BILITOT 1.1 0.8  PROT 7.7 5.7*  ALBUMIN 3.2* 2.0*   No results for input(s): LIPASE, AMYLASE in the last 168 hours. No results for input(s): AMMONIA in the last 168 hours.  ABG    Component Value Date/Time   PHART 7.457 (H) 07/20/2019 1356   PCO2ART 26.1 (L)  07/20/2019 1356   PO2ART 153.0 (H) 07/20/2019 1356   HCO3 18.4 (L) 07/20/2019 1356   TCO2 19 (L) 07/20/2019 1356   ACIDBASEDEF 4.0 (H) 07/20/2019 1356   O2SAT 100.0 07/20/2019 1356     Coagulation Profile: Recent Labs  Lab 07/19/19 2110  INR 1.5*   HbA1C: Hgb A1c MFr Bld  Date/Time Value Ref Range Status  05/30/2019 11:22 AM 10.6 (H) 4.8 - 5.6 % Final    Comment:    (NOTE) Pre diabetes:          5.7%-6.4% Diabetes:              >6.4% Glycemic control for   <7.0% adults with diabetes     CBG: Recent Labs  Lab 07/21/19 1543 07/21/19 1938 07/21/19 2347 07/22/19 0356 07/22/19 0747  GLUCAP 142* 146* 180* 150* 164*    Review of Systems:   Unable to obtain  Past Medical History  He,  has a past medical history of Diabetes ([place]).  CVA  Surgical History    Past Surgical History:  Procedure Laterality Date  . CHOLECYSTECTOMY    . IR GASTROSTOMY TUBE MOD SED  07/04/2019    Social History   reports that he has never smoked. He has never used smokeless tobacco. He reports current alcohol use. He reports that he does not use drugs.   Family History   His family history is not on file.   Allergies No Known Allergies   Home Medications  Prior to Admission medications   Medication Sig Start Date End Date Taking? Authorizing Provider  acetaminophen (TYLENOL) 325 MG tablet Place 1-2 tablets (325-650 mg total) into feeding tube every 4 (four) hours as needed for mild pain. 07/17/19   Love, Ivan Anchors, PA-C  Amino Acids-Protein Hydrolys (FEEDING SUPPLEMENT, PRO-STAT SUGAR FREE 64,) LIQD Place 30  mLs into feeding tube 2 (two) times daily. 07/17/19   Love, Ivan Anchors, PA-C  amLODipine (NORVASC) 10 MG tablet Place 1 tablet (10 mg total) into feeding tube daily. 07/17/19   Love, Ivan Anchors, PA-C  aspirin 81 MG chewable tablet Place 1 tablet (81 mg total) into feeding tube daily. 06/28/19   Debbe Odea, MD  atorvastatin (LIPITOR) 40 MG tablet Place 1 tablet (40 mg total) into feeding tube daily at 6 PM. 07/17/19   Love, Ivan Anchors, PA-C  bethanechol (URECHOLINE) 25 MG tablet Place 1 tablet (25 mg total) into feeding tube 4 (four) times daily. 07/17/19   Love, Ivan Anchors, PA-C  blood glucose meter kit and supplies KIT Dispense based on patient and insurance preference. Use up to four times daily as directed. (FOR ICD-9 250.00, 250.01). 07/17/19   Love, Ivan Anchors, PA-C  budesonide (PULMICORT) 0.25 MG/2ML nebulizer solution Take 2 mLs (0.25 mg total) by nebulization 2 (two) times daily. 07/17/19   Love, Ivan Anchors, PA-C  citalopram (CELEXA) 10 MG tablet Place 1 tablet (10 mg total) into feeding tube daily. 07/18/19   Love, Ivan Anchors, PA-C  clopidogrel (PLAVIX) 75 MG tablet Place 1 tablet (75 mg total) into feeding tube daily. 07/17/19   Love, Ivan Anchors, PA-C  famotidine (PEPCID) 40 MG/5ML suspension Place 2.5 mLs (20 mg total) into feeding tube 2 (two) times daily. 07/17/19   Love, Ivan Anchors, PA-C  finasteride (PROSCAR) 5 MG tablet Put in tube 07/17/19   Love, Ivan Anchors, PA-C  glycopyrrolate (ROBINUL) 1 MG tablet Place 2 tablets (2 mg total) into feeding tube 2 (two) times daily. 07/17/19   Reesa Chew  S, PA-C  guaiFENesin-dextromethorphan (ROBITUSSIN DM) 100-10 MG/5ML syrup Place 15 mLs into feeding tube every 6 (six) hours. 07/17/19   Love, Ivan Anchors, PA-C  Insulin Glargine (LANTUS) 100 UNIT/ML Solostar Pen Inject 33 Units into the skin daily. 07/17/19   Love, Ivan Anchors, PA-C  Insulin Pen Needle (PEN NEEDLES) 32G X 6 MM MISC 1 application by Does not apply route daily. 07/17/19   Love, Ivan Anchors, PA-C  mouth rinse LIQD solution  15 mLs by Mouth Rinse route 5 (five) times daily. 07/17/19   Love, Ivan Anchors, PA-C  Nutritional Supplements (FEEDING SUPPLEMENT, OSMOLITE 1.5 CAL,) LIQD Place 474 mLs into feeding tube 3 (three) times daily with meals. 07/17/19   Love, Ivan Anchors, PA-C  QUEtiapine (SEROQUEL) 100 MG tablet Place 1 tablet (100 mg total) into feeding tube at bedtime. 07/17/19   Love, Ivan Anchors, PA-C  tamsulosin (FLOMAX) 0.4 MG CAPS capsule Open capsule and administer medication via PEG tube 07/17/19   Love, Ivan Anchors, PA-C  Water For Irrigation, Sterile (FREE WATER) SOLN Place 200 mLs into feeding tube 4 (four) times daily -  with meals and at bedtime. Use filtered or bottled water (not distilled water) 07/17/19   Love, Ivan Anchors, PA-C     Critical care time    Tammy Parrett NP-C  [facility]  [phone]  07/22/2019

## 2019-07-23 LAB — CULTURE, BLOOD (ROUTINE X 2)

## 2019-07-23 LAB — COMPREHENSIVE METABOLIC PANEL
ALT: 48 U/L — ABNORMAL HIGH (ref 0–44)
AST: 22 U/L (ref 15–41)
Albumin: 2.1 g/dL — ABNORMAL LOW (ref 3.5–5.0)
Alkaline Phosphatase: 105 U/L (ref 38–126)
Anion gap: 6 (ref 5–15)
BUN: 21 mg/dL (ref 8–23)
CO2: 26 mmol/L (ref 22–32)
Calcium: 8.4 mg/dL — ABNORMAL LOW (ref 8.9–10.3)
Chloride: 111 mmol/L (ref 98–111)
Creatinine, Ser: 0.69 mg/dL (ref 0.61–1.24)
GFR calc Af Amer: >60 mL/min (ref >60)
GFR calc non Af Amer: >60 mL/min (ref >60)
Glucose, Bld: 173 mg/dL — ABNORMAL HIGH (ref 70–99)
Potassium: 4.1 mmol/L (ref 3.5–5.1)
Sodium: 143 mmol/L (ref 135–145)
Total Bilirubin: 0.2 mg/dL — ABNORMAL LOW (ref 0.3–1.2)
Total Protein: 6.1 g/dL — ABNORMAL LOW (ref 6.5–8.1)

## 2019-07-23 LAB — CBC
HCT: 34.4 % — ABNORMAL LOW (ref 39.0–52.0)
Hemoglobin: 10.9 g/dL — ABNORMAL LOW (ref 13.0–17.0)
MCH: 29.9 pg (ref 26.0–34.0)
MCHC: 31.7 g/dL (ref 30.0–36.0)
MCV: 94.2 fL (ref 80.0–100.0)
Platelets: 210 10*3/uL (ref 150–400)
RBC: 3.65 MIL/uL — ABNORMAL LOW (ref 4.22–5.81)
RDW: 13 % (ref 11.5–15.5)
WBC: 17.1 10*3/uL — ABNORMAL HIGH (ref 4.0–10.5)
nRBC: 0.1 % (ref 0.0–0.2)

## 2019-07-23 LAB — GLUCOSE, CAPILLARY
Glucose-Capillary: 105 mg/dL — ABNORMAL HIGH (ref 70–99)
Glucose-Capillary: 138 mg/dL — ABNORMAL HIGH (ref 70–99)
Glucose-Capillary: 172 mg/dL — ABNORMAL HIGH (ref 70–99)
Glucose-Capillary: 178 mg/dL — ABNORMAL HIGH (ref 70–99)
Glucose-Capillary: 179 mg/dL — ABNORMAL HIGH (ref 70–99)
Glucose-Capillary: 181 mg/dL — ABNORMAL HIGH (ref 70–99)

## 2019-07-23 LAB — MAGNESIUM: Magnesium: 2 mg/dL (ref 1.7–2.4)

## 2019-07-23 LAB — PHOSPHORUS: Phosphorus: 2.5 mg/dL (ref 2.5–4.6)

## 2019-07-23 MED ORDER — LIDOCAINE 5 % EX PTCH
1.0000 | MEDICATED_PATCH | CUTANEOUS | Status: DC
  Administered 2019-07-23 – 2019-07-25 (×3): 1 via TRANSDERMAL
  Filled 2019-07-23 (×4): qty 1

## 2019-07-23 NOTE — Progress Notes (Signed)
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  Trach decannulation 9/22 and [facility].  He presented to [facility] on 9/30 with dyspnea, productive cough and dysuria found to have septic shock with UTI/aspiration pneumonia and given greater than 3.5 L normal saline in ED and started on empiric antibiotics.  Initially on BiPAP however his clinical status continued to worsen and patient was intubated and started on norepinephrine and was admitted to the ICU.  Patient was weaned off vasopressors and extubated on 10/2 and continued on Ancef.  Hospitalist took over care on 10/4.   Assessment & Plan:   Active Problems:   Septic shock (HCC)   1. Septic shock secondary to Klebsiella bacteremia and UTI in combination with rhinovirus afebrile and hemodynamically stable with improving leukocytosis (17.1). previously required Levophed, off vasopressors and extubated 10/2.  Day 5/14 antibiotics, on Ancef 1. Continue antibiotics 2. CBC in a.m 3. Monitor overnight and consider switching to cefdinir 300 oral twice daily to complete 14 days tomorrow 2. Multifocal pneumonia improving 1. Continue pulmonary toilet 2. Continue antibiotics 3. Mobilize as able 4. Pulmicort twice daily  3. Acute hypoxemic respiratory failure in setting of rhinovirus requiring intubation improving.  As above 4. AKI resolved continue to monitor 5. 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  Had trach decannulation on 9/22 and discharged to [facility] on 9/[facility] prior to this admission as above.  Has residual left upper extremity weakness most notably in abductors and residual left lower extremity weakness 1. PT OT 2. Continue aspirin 3. Lipitor on hold in setting of transaminitis 6. Diabetes stable.   Continue sliding scale 7. Transaminitis improved.  Likely multifactorial in setting of septic shock and viral infection.  Monitor   DVT prophylaxis: Heparin Code Status: Full code Family Communication: Updated wife over the phone. Disposition Plan: pending medical stability and PT OT eval  Consultants:   PCCM  Procedures:  10/1- central line placed 10/1- intubated 10/2- extubated   Micro Data:  Blood 9/30 > 1/2 blood Cx kleb pna Urine 9/30 > 100k GNR>Klebsiella Pna  Tracheal aspirate 9/30 > rare GPC and PMN predominance RVP 10/1 > positive for rhinovirus/ enterovirus Strep urinary antigen 10/1 NEG  Legionella 10/1 NEG  Antimicrobials:  Cefepime 9/30 >10/1 Vancomycin 9/30 >10/1 Flagyl 9/30 >10/1  CTX 10/2>10/2  Cefazolin 10/3 >>    Subjective: Patient seen and examined at bedside in the ICU resting comfortably.  States that he is feeling better and his symptoms are improving.  Denies any acute complaints at this time.  Admits to residual weakness in his left upper extremity and left lower extremity.  Has difficulty with flexion of the shoulder but more especially abduction of the shoulder.  He has some aching in his left arm as well which is constant.  Otherwise denies any chest pain, palpitations, nausea, vomiting, diarrhea, constipation.  Objective: Vitals:   07/23/19 0900 07/23/19 1000 07/23/19 1100 07/23/19 1111  BP: 134/77 129/78 130/80   Pulse: 78 80 71   Resp: (!) 24 (!) 26 (!) 33   Temp:    98.5 F (36.9 C)  TempSrc:    Oral  SpO2: 95% 96% 96%   Weight:      Height:        Intake/Output  Summary (Last 24 hours) at 07/23/2019 1502 Last data filed at 07/23/2019 1200 Gross per 24 hour  Intake 2064.72 ml  Output 1525 ml  Net 539.72 ml   Filed Weights   07/21/19 0401 07/22/19 0357 07/23/19 0350  Weight: (!) 143.2 kg (!) 142.2 kg (!) 142.4 kg    Examination:  General exam: Appears calm and comfortable  Respiratory system: Productive cough respiratory  effort normal. Cardiovascular system: S1 & S2 heard, RRR. No JVD, murmurs, rubs, gallops or clicks. No pedal edema. Gastrointestinal system: Abdomen is nondistended, soft and nontender. No organomegaly or masses felt. Normal bowel sounds heard. Central nervous system: Alert and oriented. No focal neurological deficits. Extremities: Left lower extremity 3/5 muscle strength, left upper extremity 3/5 shoulder flexion, 1-2/5 abduction.  Sensation intact Skin: No rashes, lesions or ulcers Psychiatry: Judgement and insight appear normal. Mood & affect appropriate.     Data Reviewed: I have personally reviewed following labs and imaging studies  CBC: Recent Labs  Lab 07/19/19 2110 07/20/19 0356 07/20/19 1356 07/21/19 0230 07/22/19 0235 07/23/19 0230  WBC 5.0 14.6*  --  21.3* 22.0* 17.1*  NEUTROABS 4.8  --   --   --   --   --   HGB 13.7 11.1* 11.2* 10.3* 10.8* 10.9*  HCT 43.2 34.4* 33.0* 30.4* 32.5* 34.4*  MCV 95.6 95.0  --  93.0 92.9 94.2  PLT 209 207  --  177 173 210   Basic Metabolic Panel: Recent Labs  Lab 07/20/19 1530 07/20/19 1840 07/21/19 0230 07/21/19 1601 07/22/19 0235 07/23/19 0230  NA 140  --  137 141 141 143  K 4.6  --  3.4* 4.0 4.0 4.1  CL 108  --  107 110 113* 111  CO2 20*  --  20* GLUCOSE 128*  --  188* 164* 187* 173*  BUN 25*  --  30* 29* 28* 21  CREATININE 1.23  --  1.02 0.75 0.71 0.69  CALCIUM 7.8*  --  7.4* 7.9* 7.8* 8.4*  MG  --  1.6* 1.8  --   --  2.0  PHOS  --  3.0 1.4*  --   --  2.5   GFR: Estimated Creatinine Clearance: 136.5 mL/min (by C-G formula based on SCr of 0.69 mg/dL). Liver Function Tests: Recent Labs  Lab 07/19/19 2110 07/21/19 0230 07/23/19 0230  AST 98* 53* 22  ALT 69* 82* 48*  ALKPHOS 172* 93 105  BILITOT 1.1 0.8 0.2*  PROT 7.7 5.7* 6.1*  ALBUMIN 3.2* 2.0* 2.1*   No results for input(s): LIPASE, AMYLASE in the last 168 hours. No results for input(s): AMMONIA in the last 168 hours. Coagulation Profile: Recent  Labs  Lab 07/19/19 2110  INR 1.5*   Cardiac Enzymes: No results for input(s): CKTOTAL, CKMB, CKMBINDEX, TROPONINI in the last 168 hours. BNP (last 3 results) No results for input(s): PROBNP in the last 8760 hours. HbA1C: No results for input(s): HGBA1C in the last 72 hours. CBG: Recent Labs  Lab 07/22/19 1940 07/22/19 2338 07/23/19 0340 07/23/19 0701 07/23/19 1110  GLUCAP 162* 179* 179* 181* 138*   Lipid Profile: No results for input(s): CHOL, HDL, LDLCALC, TRIG, CHOLHDL, LDLDIRECT in the last 72 hours. Thyroid Function Tests: No results for input(s): TSH, T4TOTAL, FREET4, T3FREE, THYROIDAB in the last 72 hours. Anemia Panel: No results for input(s): VITAMINB12, FOLATE, FERRITIN, TIBC, IRON, RETICCTPCT in the last 72 hours. Sepsis Labs: Recent Labs  Lab 07/19/19 0004 07/19/19 2110 07/20/19 0356  07/20/19 1545  LATICACIDVEN 4.6* 7.1* 4.8* 3.6*    Recent Results (from the past 240 hour(s))  Blood Culture (routine x 2)     Status: Abnormal   Collection Time: 07/19/19  8:46 PM   Specimen: BLOOD LEFT HAND  Result Value Ref Range Status   Specimen Description BLOOD LEFT HAND  Final   Special Requests   Final    BOTTLES DRAWN AEROBIC AND ANAEROBIC Blood Culture results may not be optimal due to an inadequate volume of blood received in culture bottles   Culture  Setup Time   Final    IN BOTH AEROBIC AND ANAEROBIC BOTTLES GRAM NEGATIVE RODS CRITICAL RESULT CALLED TO, READ BACK BY AND VERIFIED WITH: L[place]le [place] [facility] 07/20/19 2201 JDW Performed at [facility] Lab, [address] [place], [place] [address]    Culture KLEBSIELLA PNEUMONIAE (A)  Final   Report Status 07/23/2019 FINAL  Final   Organism ID, Bacteria KLEBSIELLA PNEUMONIAE  Final      Susceptibility   Klebsiella pneumoniae - MIC*    AMPICILLIN >=32 RESISTANT Resistant     CEFAZOLIN <=4 SENSITIVE Sensitive     CEFEPIME <=1 SENSITIVE Sensitive     CEFTAZIDIME <=1 SENSITIVE Sensitive     CEFTRIAXONE <=1  SENSITIVE Sensitive     CIPROFLOXACIN <=0.25 SENSITIVE Sensitive     GENTAMICIN <=1 SENSITIVE Sensitive     IMIPENEM <=0.25 SENSITIVE Sensitive     TRIMETH/SULFA <=20 SENSITIVE Sensitive     AMPICILLIN/SULBACTAM 4 SENSITIVE Sensitive     PIP/TAZO <=4 SENSITIVE Sensitive     * KLEBSIELLA PNEUMONIAE  Blood Culture ID Panel (Reflexed)     Status: Abnormal   Collection Time: 07/19/19  8:46 PM  Result Value Ref Range Status   Enterococcus species NOT DETECTED NOT DETECTED Final   Listeria monocytogenes NOT DETECTED NOT DETECTED Final   Staphylococcus species NOT DETECTED NOT DETECTED Final   Staphylococcus aureus (BCID) NOT DETECTED NOT DETECTED Final   Streptococcus species NOT DETECTED NOT DETECTED Final   Streptococcus agalactiae NOT DETECTED NOT DETECTED Final   Streptococcus pneumoniae NOT DETECTED NOT DETECTED Final   Streptococcus pyogenes NOT DETECTED NOT DETECTED Final   Acinetobacter baumannii NOT DETECTED NOT DETECTED Final   Enterobacteriaceae species DETECTED (A) NOT DETECTED Final    Comment: Enterobacteriaceae represent a large family of gram-negative bacteria, not a single organism. CRITICAL RESULT CALLED TO, READ BACK BY AND VERIFIED WITH: Lytle But[facility] 07/20/19 2201 JDW    Enterobacter cloacae complex NOT DETECTED NOT DETECTED Final   Escherichia coli NOT DETECTED NOT DETECTED Final   Klebsiella oxytoca NOT DETECTED NOT DETECTED Final   Klebsiella pneumoniae DETECTED (A) NOT DETECTED Final    Comment: CRITICAL RESULT CALLED TO, READ BACK BY AND VERIFIED WITH: Lytle [place] [facility] 07/20/19 2201 JDW    Proteus species NOT DETECTED NOT DETECTED Final   Serratia marcescens NOT DETECTED NOT DETECTED Final   Carbapenem resistance NOT DETECTED NOT DETECTED Final   Haemophilus influenzae NOT DETECTED NOT DETECTED Final   Neisseria meningitidis NOT DETECTED NOT DETECTED Final   Pseudomonas aeruginosa NOT DETECTED NOT DETECTED Final   Candida albicans NOT DETECTED NOT DETECTED  Final   Candida glabrata NOT DETECTED NOT DETECTED Final   Candida krusei NOT DETECTED NOT DETECTED Final   Candida parapsilosis NOT DETECTED NOT DETECTED Final   Candida tropicalis NOT DETECTED NOT DETECTED Final    Comment: Performed at [facility], [address], [place], [place] [address]  Urine culture     Status: Abnormal   Collection Time: 07/19/19  9:10 PM   Specimen: In/Out Cath Urine  Result Value Ref Range Status   Specimen Description IN/OUT CATH URINE  Final   Special Requests   Final    NONE Performed at [facility], [address] [place], [place] [address]    Culture >=100,000 COLONIES/mL KLEBSIELLA PNEUMONIAE (A)  Final   Report Status 07/21/2019 FINAL  Final   Organism ID, Bacteria KLEBSIELLA PNEUMONIAE (A)  Final      Susceptibility   Klebsiella pneumoniae - MIC*    AMPICILLIN >=32 RESISTANT Resistant     CEFAZOLIN <=4 SENSITIVE Sensitive     CEFTRIAXONE <=1 SENSITIVE Sensitive     CIPROFLOXACIN <=0.25 SENSITIVE Sensitive     GENTAMICIN <=1 SENSITIVE Sensitive     IMIPENEM <=0.25 SENSITIVE Sensitive     NITROFURANTOIN 128 RESISTANT Resistant     TRIMETH/SULFA <=20 SENSITIVE Sensitive     AMPICILLIN/SULBACTAM 8 SENSITIVE Sensitive     PIP/TAZO <=4 SENSITIVE Sensitive     Extended ESBL NEGATIVE Sensitive     * >=100,000 COLONIES/mL KLEBSIELLA PNEUMONIAE  SARS Coronavirus 2 [facility] order, Performed in [place] hospital lab) Nasopharyngeal     Status: None   Collection Time: 07/19/19  9:10 PM   Specimen: Nasopharyngeal  Result Value Ref Range Status   SARS Coronavirus 2 NEGATIVE NEGATIVE Final    Comment: (NOTE) If result is NEGATIVE SARS-CoV-2 target nucleic acids are NOT DETECTED. The SARS-CoV-2 RNA is generally detectable in upper and lower  respiratory specimens during the acute phase of infection. The lowest  concentration of SARS-CoV-2 viral copies this assay can detect is 250  copies / mL. A negative result does not preclude  SARS-CoV-2 infection  and should not be used as the sole basis for treatment or other  patient management decisions.  A negative result may occur with  improper specimen collection / handling, submission of specimen other  than nasopharyngeal swab, presence of viral mutation(s) within the  areas targeted by this assay, and inadequate number of viral copies  (<250 copies / mL). A negative result must be combined with clinical  observations, patient history, and epidemiological information. If result is POSITIVE SARS-CoV-2 target nucleic acids are DETECTED. The SARS-CoV-2 RNA is generally detectable in upper and lower  respiratory specimens dur ing the acute phase of infection.  Positive  results are indicative of active infection with SARS-CoV-2.  Clinical  correlation with patient history and other diagnostic information is  necessary to determine patient infection status.  Positive results do  not rule out bacterial infection or co-infection with other viruses. If result is PRESUMPTIVE POSTIVE SARS-CoV-2 nucleic acids MAY BE PRESENT.   A presumptive positive result was obtained on the submitted specimen  and confirmed on repeat testing.  While 2019 novel coronavirus  (SARS-CoV-2) nucleic acids may be present in the submitted sample  additional confirmatory testing may be necessary for epidemiological  and / or clinical management purposes  to differentiate between  SARS-CoV-2 and other Sarbecovirus currently known to infect humans.  If clinically indicated additional testing with an alternate test  methodology [phone]) is advised. The SARS-CoV-2 RNA is generally  detectable in upper and lower respiratory sp ecimens during the acute  phase of infection. The expected result is Negative. Fact Sheet for Patients:  StrictlyIdeas.no Fact Sheet for Healthcare Providers: BankingDealers.co.za This test is not yet approved or cleared by the  [place] FDA and  has been authorized for detection and/or diagnosis of SARS-CoV-2 by FDA under an Emergency Use Authorization (EUA).  This EUA will remain in effect (meaning this test can be used) for the duration of the COVID-19 declaration under Section 564(b)(1) of the Act, 21 U.S.C. section 360bbb-3(b)(1), unless the authorization is terminated or revoked sooner. Performed at [facility] Lab, [address] [place], [place] [address]   Blood Culture (routine x 2)     Status: None (Preliminary result)   Collection Time: 07/19/19 10:20 PM   Specimen: BLOOD  Result Value Ref Range Status   Specimen Description BLOOD BLOOD LEFT FOREARM  Final   Special Requests   Final    BOTTLES DRAWN AEROBIC AND ANAEROBIC Blood Culture adequate volume   Culture   Final    NO GROWTH 3 DAYS Performed at [facility], [address], [place], [place] [address]    Report Status PENDING  Incomplete  MRSA PCR Screening     Status: None   Collection Time: 07/20/19  3:58 AM  Result Value Ref Range Status   MRSA by PCR NEGATIVE NEGATIVE Final    Comment:        The GeneXpert MRSA Assay (FDA approved for NASAL specimens only), is one component of a comprehensive MRSA colonization surveillance program. It is not intended to diagnose MRSA infection nor to guide or monitor treatment for MRSA infections. Performed at [facility] Lab, [address], [place], [place] [address]   Culture, respiratory (non-expectorated)     Status: None   Collection Time: 07/20/19  5:29 AM   Specimen: Tracheal Aspirate; Respiratory  Result Value Ref Range Status   Specimen Description TRACHEAL ASPIRATE  Final   Special Requests NONE  Final   Gram Stain   Final    ABUNDANT WBC PRESENT, PREDOMINANTLY PMN RARE GRAM POSITIVE COCCI Performed at [facility], [address] [place], [place] [address]    Culture RARE STAPHYLOCOCCUS AUREUS  Final   Report Status 07/22/2019 FINAL  Final   Organism  ID, Bacteria STAPHYLOCOCCUS AUREUS  Final      Susceptibility   Staphylococcus aureus - MIC*    CIPROFLOXACIN <=0.5 SENSITIVE Sensitive     ERYTHROMYCIN <=0.25 SENSITIVE Sensitive     GENTAMICIN <=0.5 SENSITIVE Sensitive     OXACILLIN 0.5 SENSITIVE Sensitive     TETRACYCLINE <=1 SENSITIVE Sensitive     VANCOMYCIN 1 SENSITIVE Sensitive     TRIMETH/SULFA <=10 SENSITIVE Sensitive     CLINDAMYCIN <=0.25 SENSITIVE Sensitive     RIFAMPIN <=0.5 SENSITIVE Sensitive     Inducible Clindamycin NEGATIVE Sensitive     * RARE STAPHYLOCOCCUS AUREUS  Respiratory Panel by PCR     Status: Abnormal   Collection Time: 07/20/19  8:31 AM   Specimen: Nasopharyngeal Swab; Respiratory  Result Value Ref Range Status   Adenovirus NOT DETECTED NOT DETECTED Final   Coronavirus 229E NOT DETECTED NOT DETECTED Final    Comment: (NOTE) The Coronavirus on the Respiratory Panel, DOES NOT test for the novel  Coronavirus (2019 nCoV)    Coronavirus HKU1 NOT DETECTED NOT DETECTED Final   Coronavirus NL63 NOT DETECTED NOT DETECTED Final   Coronavirus OC43 NOT DETECTED NOT DETECTED Final   Metapneumovirus NOT DETECTED NOT DETECTED Final   Rhinovirus / Enterovirus DETECTED (A) NOT DETECTED Final   Influenza A NOT DETECTED NOT DETECTED Final   Influenza B NOT DETECTED NOT DETECTED Final   Parainfluenza Virus 1 NOT DETECTED  NOT DETECTED Final   Parainfluenza Virus 2 NOT DETECTED NOT DETECTED Final   Parainfluenza Virus 3 NOT DETECTED NOT DETECTED Final   Parainfluenza Virus 4 NOT DETECTED NOT DETECTED Final   Respiratory Syncytial Virus NOT DETECTED NOT DETECTED Final   Bordetella pertussis NOT DETECTED NOT DETECTED Final   Chlamydophila pneumoniae NOT DETECTED NOT DETECTED Final   Mycoplasma pneumoniae NOT DETECTED NOT DETECTED Final    Comment: Performed at [facility], [address] [place], [place] [address]         Radiology Studies: US Renal  Result Date: 07/21/2019 CLINICAL DATA:  Pyelonephritis  EXAM: RENAL / URINARY TRACT ULTRASOUND COMPLETE COMPARISON:  None. FINDINGS: Right Kidney: Renal measurements: 13.4 x 6.2 x 5.9 cm = volume: 257 mL . Echogenicity within normal limits. No mass or hydronephrosis visualized. Left Kidney: Renal measurements: 13.7 x 7.7 x 5.7 cm = volume: 310 mL. Echogenicity within normal limits. No mass or hydronephrosis visualized. Bladder: Appears normal for degree of bladder distention. IMPRESSION: 1. Normal renal ultrasound. 2. Normal bladder. Electronically Signed   By: Genevive Bi M.D.   On: 07/21/2019 17:07   Dg Chest Port 1 View  Result Date: 07/22/2019 CLINICAL DATA:  Follow-up pneumonia. EXAM: PORTABLE CHEST 1 VIEW COMPARISON:  07/20/2019 FINDINGS: Interval removal of multiple tubes and lines. Lungs are adequately inflated with minimal residual patchy density left mid to lower lung likely atelectasis or infection. No significant effusion. Mild stable cardiomegaly. Remainder of the exam is unchanged. IMPRESSION: Interval improvement with minimal residual patchy density over the left mid to lower lung likely improving atelectasis or infection. Electronically Signed   By: Elberta Fortis M.D.   On: 07/22/2019 11:59        Scheduled Meds: . acetaminophen  650 mg Rectal Once  . aspirin  81 mg Per Tube Daily  . budesonide  0.25 mg Nebulization BID  . chlorhexidine  15 mL Mouth Rinse BID  . Chlorhexidine Gluconate Cloth  6 each Topical Daily  . citalopram  10 mg Per Tube Daily  . clopidogrel  75 mg Per Tube Daily  . feeding supplement (VITAL HIGH PROTEIN)  1,000 mL Per Tube Q24H  . finasteride  5 mg Oral Daily  . heparin  5,000 Units Subcutaneous Q8H  . insulin aspart  0-15 Units Subcutaneous Q4H  . mouth rinse  15 mL Mouth Rinse q12n4p  . sodium chloride flush  10-40 mL Intracatheter Q12H   Continuous Infusions: . sodium chloride 10 mL/hr at 07/23/19 0800  .  ceFAZolin (ANCEF) IV 2 g (07/23/19 1356)     LOS: 3 days    Time spent: 45 minutes     Jae Dire, DO [facility] Pager [phone]  If 7PM-7AM, please contact night-coverage [email or web address] Password [email or web address] 07/23/2019, 3:02 PM

## 2019-07-23 NOTE — Progress Notes (Signed)
This RN was sitting at the desk and noted on the central monitors that patient 02 sats were 82% with a good waveform. Upon walking into the room the patient was attempting to cough and had notable secretions in the back of his airway. Patient was deep suctioned at this time with copious clear/white secretions. Patient RR unchanged and no increased work of breathing. Applied 4 L [place] however patient requests to go on his CPAP for the night.

## 2019-07-24 LAB — CBC
HCT: 34.5 % — ABNORMAL LOW (ref 39.0–52.0)
Hemoglobin: 11.5 g/dL — ABNORMAL LOW (ref 13.0–17.0)
MCH: 30.9 pg (ref 26.0–34.0)
MCHC: 33.3 g/dL (ref 30.0–36.0)
MCV: 92.7 fL (ref 80.0–100.0)
Platelets: 209 10*3/uL (ref 150–400)
RBC: 3.72 MIL/uL — ABNORMAL LOW (ref 4.22–5.81)
RDW: 12.7 % (ref 11.5–15.5)
WBC: 15.1 10*3/uL — ABNORMAL HIGH (ref 4.0–10.5)
nRBC: 0.1 % (ref 0.0–0.2)

## 2019-07-24 LAB — GLUCOSE, CAPILLARY
Glucose-Capillary: 166 mg/dL — ABNORMAL HIGH (ref 70–99)
Glucose-Capillary: 165 mg/dL — ABNORMAL HIGH (ref 70–99)
Glucose-Capillary: 116 mg/dL — ABNORMAL HIGH (ref 70–99)
Glucose-Capillary: 146 mg/dL — ABNORMAL HIGH (ref 70–99)
Glucose-Capillary: 164 mg/dL — ABNORMAL HIGH (ref 70–99)

## 2019-07-24 LAB — COMPREHENSIVE METABOLIC PANEL
ALT: 36 U/L (ref 0–44)
AST: 21 U/L (ref 15–41)
Albumin: 2.2 g/dL — ABNORMAL LOW (ref 3.5–5.0)
Alkaline Phosphatase: 101 U/L (ref 38–126)
Anion gap: 8 (ref 5–15)
BUN: 18 mg/dL (ref 8–23)
CO2: 26 mmol/L (ref 22–32)
Calcium: 9.3 mg/dL (ref 8.9–10.3)
Chloride: 106 mmol/L (ref 98–111)
Creatinine, Ser: 0.64 mg/dL (ref 0.61–1.24)
GFR calc Af Amer: >60 mL/min (ref >60)
GFR calc non Af Amer: >60 mL/min (ref >60)
Glucose, Bld: 167 mg/dL — ABNORMAL HIGH (ref 70–99)
Potassium: 4.3 mmol/L (ref 3.5–5.1)
Sodium: 140 mmol/L (ref 135–145)
Total Bilirubin: 0.4 mg/dL (ref 0.3–1.2)
Total Protein: 6.7 g/dL (ref 6.5–8.1)

## 2019-07-24 MED ORDER — OSMOLITE 1.5 CAL PO LIQD
1000.0000 mL | ORAL | Status: DC
  Administered 2019-07-24 – 2019-07-26 (×2): 1000 mL
  Filled 2019-07-24 (×4): qty 1000

## 2019-07-24 MED ORDER — ACETAMINOPHEN 325 MG PO TABS
650.0000 mg | ORAL_TABLET | Freq: Four times a day (QID) | ORAL | Status: DC | PRN
  Administered 2019-07-24 – 2019-07-26 (×3): 650 mg
  Filled 2019-07-24 (×3): qty 2

## 2019-07-24 MED ORDER — PRO-STAT SUGAR FREE PO LIQD
60.0000 mL | Freq: Three times a day (TID) | ORAL | Status: DC
  Administered 2019-07-24 – 2019-07-26 (×7): 60 mL via ORAL
  Filled 2019-07-24 (×8): qty 60

## 2019-07-24 MED ORDER — CEFDINIR 300 MG PO CAPS: 300.0000 mg | ORAL_CAPSULE | Freq: Two times a day (BID) | ORAL | Status: DC

## 2019-07-24 MED ORDER — CEFDINIR 300 MG PO CAPS
300.0000 mg | ORAL_CAPSULE | Freq: Two times a day (BID) | ORAL | Status: DC
  Administered 2019-07-24 – 2019-07-26 (×5): 300 mg
  Filled 2019-07-24 (×6): qty 1

## 2019-07-24 NOTE — Progress Notes (Signed)
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  Trach decannulation 9/22 and discharged to [facility] 9/28.  He presented to [facility] on 9/30 with dyspnea, productive cough and dysuria found to have septic shock with UTI/aspiration pneumonia and given greater than 3.5 L normal saline in ED and started on empiric antibiotics.  Initially on BiPAP however his clinical status continued to worsen and patient was intubated and started on norepinephrine and was admitted to the ICU.  Patient was weaned off vasopressors and extubated on 10/2 and continued on Ancef.  Hospitalist took over care on 10/4.   Assessment & Plan:   Active Problems:   Septic shock ([place])   1. Septic shock secondary to Klebsiella bacteremia and UTI in combination with rhinovirus afebrile and hemodynamically stable with improving leukocytosis (15). previously required Levophed, off vasopressors and extubated 10/2.  Day 6/14 antibiotics 1. Ancef switched to Cefdinir 300 mg PO BID today 2. CBC in a.m 2. Multifocal pneumonia improving. Still with respiratory secretions requiring suctioning with intermittent hypoxic episodes and CPAP nightly 1. Continue pulmonary toilet 2. Continue antibiotics 3. Mobilize as able 4. Pulmicort twice daily  5. Titrate oxygen as appropriate 6. May need CPAP machine and O2 at discharge as well as home suction 3. Acute hypoxemic respiratory failure in setting of rhinovirus requiring intubation improving.  As above 4. AKI resolved continue to monitor 5. 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  Had trach decannulation on 9/22 and discharged to SNF on 9/28 prior to this admission as above.  Has residual left upper extremity weakness most notably in abductors and residual left lower  extremity weakness. Lipitor on hold in setting of transaminitis 1. PT OT 2. Continue aspirin 6. Diabetes stable.  Continue sliding scale 7. Transaminitis improved.  Likely multifactorial in setting of septic shock and viral infection.  Monitor   DVT prophylaxis: Heparin Code Status: Full code Disposition Plan: pending medical stability and PT OT eval  Consultants:   PCCM  Procedures:  10/1- central line placed 10/1- intubated 10/2- extubated   Micro Data:  Blood 9/30 > 1/2 blood Cx kleb pna Urine 9/30 > 100k GNR>Klebsiella Pna  Tracheal aspirate 9/30 > rare GPC and PMN predominance RVP 10/1 > positive for rhinovirus/ enterovirus Strep urinary antigen 10/1 NEG  Legionella 10/1 NEG  Antimicrobials:  Cefepime 9/30 >10/1 Vancomycin 9/30 >10/1 Flagyl 9/30 >10/1  CTX 10/2>10/2  Cefazolin 10/3 >> 10/5 Cefdinir 10/5   Subjective: Patient in better spirits today and states lidocaine patch helped his left shoulder pain last night but not that he is not on he has recurrence of pain.  Overnight patient had an episode of hypoxia due to respiratory secretions and was placed on CPAP with improvement.  Patient denies any complaints today than above.  Objective: Vitals:   07/24/19 0500 07/24/19 0600 07/24/19 0711 07/24/19 1108  BP: 122/74 116/72    Pulse: 66 71    Resp: (!) 29 (!) 28    Temp:   98.8 F (37.1 C) 97.8 F (36.6 C)  TempSrc:   Axillary Oral  SpO2: 91% 96%    Weight:      Height:        Intake/Output Summary (Last 24 hours) at 07/24/2019 1322 Last data filed at 07/24/2019 1200 Gross  per 24 hour  Intake 1995.74 ml  Output 2725 ml  Net -729.26 ml   Filed Weights   07/22/19 0357 07/23/19 0350 07/24/19 0410  Weight: (!) 142.2 kg (!) 142.4 kg (!) 141.6 kg    Examination:  General exam: Appears calm and comfortable  Respiratory system: Productive cough respiratory effort normal. Cardiovascular system: S1 & S2 heard, RRR. No JVD, murmurs, rubs, gallops or  clicks. Gastrointestinal system: Abdomen is nondistended, soft and nontender. No organomegaly or masses felt. Normal bowel sounds heard. Central nervous system: Alert and oriented. No focal neurological deficits. Extremities: Left lower extremity 3/5 muscle strength, left upper extremity 3/5 shoulder flexion, 1-2/5 abduction.  Sensation intact Skin: No rashes, lesions or ulcers Psychiatry: Judgement and insight appear normal. Mood & affect appropriate.     Data Reviewed: I have personally reviewed following labs and imaging studies  CBC: Recent Labs  Lab 07/19/19 2110 07/20/19 0356 07/20/19 1356 07/21/19 0230 07/22/19 0235 07/23/19 0230 07/24/19 0351  WBC 5.0 14.6*  --  21.3* 22.0* 17.1* 15.1*  NEUTROABS 4.8  --   --   --   --   --   --   HGB 13.7 11.1* 11.2* 10.3* 10.8* 10.9* 11.5*  HCT 43.2 34.4* 33.0* 30.4* 32.5* 34.4* 34.5*  MCV 95.6 95.0  --  93.0 92.9 94.2 92.7  PLT 209 207  --  177 173 210 209   Basic Metabolic Panel: Recent Labs  Lab 07/20/19 1840 07/21/19 0230 07/21/19 1601 07/22/19 0235 07/23/19 0230 07/24/19 0351  NA  --  137 141 141 143 140  K  --  3.4* 4.0 4.0 4.1 4.3  CL  --  107 110 113* 111 106  CO2  --  20* GLUCOSE  --  188* 164* 187* 173* 167*  BUN  --  30* 29* 28* 21 18  CREATININE  --  1.02 0.75 0.71 0.69 0.64  CALCIUM  --  7.4* 7.9* 7.8* 8.4* 9.3  MG 1.6* 1.8  --   --  2.0  --   PHOS 3.0 1.4*  --   --  2.5  --    GFR: Estimated Creatinine Clearance: 136 mL/min (by C-G formula based on SCr of 0.64 mg/dL). Liver Function Tests: Recent Labs  Lab 07/19/19 2110 07/21/19 0230 07/23/19 0230 07/24/19 0351  AST 98* 53* 22 21  ALT 69* 82* 48* 36  ALKPHOS 172* 93 105 101  BILITOT 1.1 0.8 0.2* 0.4  PROT 7.7 5.7* 6.1* 6.7  ALBUMIN 3.2* 2.0* 2.1* 2.2*   No results for input(s): LIPASE, AMYLASE in the last 168 hours. No results for input(s): AMMONIA in the last 168 hours. Coagulation Profile: Recent Labs  Lab 07/19/19 2110  INR  1.5*   Cardiac Enzymes: No results for input(s): CKTOTAL, CKMB, CKMBINDEX, TROPONINI in the last 168 hours. BNP (last 3 results) No results for input(s): PROBNP in the last 8760 hours. HbA1C: No results for input(s): HGBA1C in the last 72 hours. CBG: Recent Labs  Lab 07/23/19 1935 07/23/19 2351 07/24/19 0402 07/24/19 0709 07/24/19 1107  GLUCAP 105* 178* 166* 165* 116*   Lipid Profile: No results for input(s): CHOL, HDL, LDLCALC, TRIG, CHOLHDL, LDLDIRECT in the last 72 hours. Thyroid Function Tests: No results for input(s): TSH, T4TOTAL, FREET4, T3FREE, THYROIDAB in the last 72 hours. Anemia Panel: No results for input(s): VITAMINB12, FOLATE, FERRITIN, TIBC, IRON, RETICCTPCT in the last 72 hours. Sepsis Labs: Recent Labs  Lab 07/19/19 0004 07/19/19 2110 07/20/19  [id number] 07/20/19 1545  LATICACIDVEN 4.6* 7.1* 4.8* 3.6*    Recent Results (from the past 240 hour(s))  Blood Culture (routine x 2)     Status: Abnormal   Collection Time: 07/19/19  8:46 PM   Specimen: BLOOD LEFT HAND  Result Value Ref Range Status   Specimen Description BLOOD LEFT HAND  Final   Special Requests   Final    BOTTLES DRAWN AEROBIC AND ANAEROBIC Blood Culture results may not be optimal due to an inadequate volume of blood received in culture bottles   Culture  Setup Time   Final    IN BOTH AEROBIC AND ANAEROBIC BOTTLES GRAM NEGATIVE RODS CRITICAL RESULT CALLED TO, READ BACK BY AND VERIFIED WITH: Lytle ButteM MACCIA [facility] 07/20/19 2201 JDW Performed at [facility], [address] [place], [place] [address]    Culture KLEBSIELLA PNEUMONIAE (A)  Final   Report Status 07/23/2019 FINAL  Final   Organism ID, Bacteria KLEBSIELLA PNEUMONIAE  Final      Susceptibility   Klebsiella pneumoniae - MIC*    AMPICILLIN >=32 RESISTANT Resistant     CEFAZOLIN <=4 SENSITIVE Sensitive     CEFEPIME <=1 SENSITIVE Sensitive     CEFTAZIDIME <=1 SENSITIVE Sensitive     CEFTRIAXONE <=1 SENSITIVE Sensitive     CIPROFLOXACIN  <=0.25 SENSITIVE Sensitive     GENTAMICIN <=1 SENSITIVE Sensitive     IMIPENEM <=0.25 SENSITIVE Sensitive     TRIMETH/SULFA <=20 SENSITIVE Sensitive     AMPICILLIN/SULBACTAM 4 SENSITIVE Sensitive     PIP/TAZO <=4 SENSITIVE Sensitive     * KLEBSIELLA PNEUMONIAE  Blood Culture ID Panel (Reflexed)     Status: Abnormal   Collection Time: 07/19/19  8:46 PM  Result Value Ref Range Status   Enterococcus species NOT DETECTED NOT DETECTED Final   Listeria monocytogenes NOT DETECTED NOT DETECTED Final   Staphylococcus species NOT DETECTED NOT DETECTED Final   Staphylococcus aureus (BCID) NOT DETECTED NOT DETECTED Final   Streptococcus species NOT DETECTED NOT DETECTED Final   Streptococcus agalactiae NOT DETECTED NOT DETECTED Final   Streptococcus pneumoniae NOT DETECTED NOT DETECTED Final   Streptococcus pyogenes NOT DETECTED NOT DETECTED Final   Acinetobacter baumannii NOT DETECTED NOT DETECTED Final   Enterobacteriaceae species DETECTED (A) NOT DETECTED Final    Comment: Enterobacteriaceae represent a large family of gram-negative bacteria, not a single organism. CRITICAL RESULT CALLED TO, READ BACK BY AND VERIFIED WITH: Lytle ButteM MACCIA [facility] 07/20/19 2201 JDW    Enterobacter cloacae complex NOT DETECTED NOT DETECTED Final   Escherichia coli NOT DETECTED NOT DETECTED Final   Klebsiella oxytoca NOT DETECTED NOT DETECTED Final   Klebsiella pneumoniae DETECTED (A) NOT DETECTED Final    Comment: CRITICAL RESULT CALLED TO, READ BACK BY AND VERIFIED WITH: Lytle ButteM MACCIA PHARMD 07/20/19 2201 JDW    Proteus species NOT DETECTED NOT DETECTED Final   Serratia marcescens NOT DETECTED NOT DETECTED Final   Carbapenem resistance NOT DETECTED NOT DETECTED Final   Haemophilus influenzae NOT DETECTED NOT DETECTED Final   Neisseria meningitidis NOT DETECTED NOT DETECTED Final   Pseudomonas aeruginosa NOT DETECTED NOT DETECTED Final   Candida albicans NOT DETECTED NOT DETECTED Final   Candida glabrata NOT DETECTED NOT  DETECTED Final   Candida krusei NOT DETECTED NOT DETECTED Final   Candida parapsilosis NOT DETECTED NOT DETECTED Final   Candida tropicalis NOT DETECTED NOT DETECTED Final    Comment: Performed at [facility], [address] [place], [place]  [address]  Urine culture     Status: Abnormal   Collection Time: 07/19/19  9:10 PM   Specimen: In/Out Cath Urine  Result Value Ref Range Status   Specimen Description IN/OUT CATH URINE  Final   Special Requests   Final    NONE Performed at [facility], [address], [place], [place] [address]    Culture >=100,000 COLONIES/mL KLEBSIELLA PNEUMONIAE (A)  Final   Report Status 07/21/2019 FINAL  Final   Organism ID, Bacteria KLEBSIELLA PNEUMONIAE (A)  Final      Susceptibility   Klebsiella pneumoniae - MIC*    AMPICILLIN >=32 RESISTANT Resistant     CEFAZOLIN <=4 SENSITIVE Sensitive     CEFTRIAXONE <=1 SENSITIVE Sensitive     CIPROFLOXACIN <=0.25 SENSITIVE Sensitive     GENTAMICIN <=1 SENSITIVE Sensitive     IMIPENEM <=0.25 SENSITIVE Sensitive     NITROFURANTOIN 128 RESISTANT Resistant     TRIMETH/SULFA <=20 SENSITIVE Sensitive     AMPICILLIN/SULBACTAM 8 SENSITIVE Sensitive     PIP/TAZO <=4 SENSITIVE Sensitive     Extended ESBL NEGATIVE Sensitive     * >=100,000 COLONIES/mL KLEBSIELLA PNEUMONIAE  SARS Coronavirus 2 [facility] order, Performed in [facility] hospital lab) Nasopharyngeal     Status: None   Collection Time: 07/19/19  9:10 PM   Specimen: Nasopharyngeal  Result Value Ref Range Status   SARS Coronavirus 2 NEGATIVE NEGATIVE Final    Comment: (NOTE) If result is NEGATIVE SARS-CoV-2 target nucleic acids are NOT DETECTED. The SARS-CoV-2 RNA is generally detectable in upper and lower  respiratory specimens during the acute phase of infection. The lowest  concentration of SARS-CoV-2 viral copies this assay can detect is 250  copies / mL. A negative result does not preclude SARS-CoV-2 infection  and should not be used as  the sole basis for treatment or other  patient management decisions.  A negative result may occur with  improper specimen collection / handling, submission of specimen other  than nasopharyngeal swab, presence of viral mutation(s) within the  areas targeted by this assay, and inadequate number of viral copies  (<250 copies / mL). A negative result must be combined with clinical  observations, patient history, and epidemiological information. If result is POSITIVE SARS-CoV-2 target nucleic acids are DETECTED. The SARS-CoV-2 RNA is generally detectable in upper and lower  respiratory specimens dur ing the acute phase of infection.  Positive  results are indicative of active infection with SARS-CoV-2.  Clinical  correlation with patient history and other diagnostic information is  necessary to determine patient infection status.  Positive results do  not rule out bacterial infection or co-infection with other viruses. If result is PRESUMPTIVE POSTIVE SARS-CoV-2 nucleic acids MAY BE PRESENT.   A presumptive positive result was obtained on the submitted specimen  and confirmed on repeat testing.  While 2019 novel coronavirus  (SARS-CoV-2) nucleic acids may be present in the submitted sample  additional confirmatory testing may be necessary for epidemiological  and / or clinical management purposes  to differentiate between  SARS-CoV-2 and other Sarbecovirus currently known to infect humans.  If clinically indicated additional testing with an alternate test  methodology [phone]) is advised. The SARS-CoV-2 RNA is generally  detectable in upper and lower respiratory sp ecimens during the acute  phase of infection. The expected result is Negative. Fact Sheet for Patients:  BoilerBrush.com.cy Fact Sheet for Healthcare Providers: https://pope.com/ This test is not yet approved or cleared by the [place] FDA  and has been authorized for  detection and/or diagnosis of SARS-CoV-2 by FDA under an Emergency Use Authorization (EUA).  This EUA will remain in effect (meaning this test can be used) for the duration of the COVID-19 declaration under Section 564(b)(1) of the Act, 21 U.S.C. section 360bbb-3(b)(1), unless the authorization is terminated or revoked sooner. Performed at [facility], [address] [place], [place] [address]   Blood Culture (routine x 2)     Status: None (Preliminary result)   Collection Time: 07/19/19 10:20 PM   Specimen: BLOOD  Result Value Ref Range Status   Specimen Description BLOOD BLOOD LEFT FOREARM  Final   Special Requests   Final    BOTTLES DRAWN AEROBIC AND ANAEROBIC Blood Culture adequate volume   Culture   Final    NO GROWTH 4 DAYS Performed at [facility], [address], [place], [place] [address]    Report Status PENDING  Incomplete  MRSA PCR Screening     Status: None   Collection Time: 07/20/19  3:58 AM  Result Value Ref Range Status   MRSA by PCR NEGATIVE NEGATIVE Final    Comment:        The GeneXpert MRSA Assay (FDA approved for NASAL specimens only), is one component of a comprehensive MRSA colonization surveillance program. It is not intended to diagnose MRSA infection nor to guide or monitor treatment for MRSA infections. Performed at [facility], [address], [place], [place] [address]   Culture, respiratory (non-expectorated)     Status: None   Collection Time: 07/20/19  5:29 AM   Specimen: Tracheal Aspirate; Respiratory  Result Value Ref Range Status   Specimen Description TRACHEAL ASPIRATE  Final   Special Requests NONE  Final   Gram Stain   Final    ABUNDANT WBC PRESENT, PREDOMINANTLY PMN RARE GRAM POSITIVE COCCI Performed at [facility], [address] [place], [place] [address]    Culture RARE STAPHYLOCOCCUS AUREUS  Final   Report Status 07/22/2019 FINAL  Final   Organism ID, Bacteria STAPHYLOCOCCUS AUREUS  Final       Susceptibility   Staphylococcus aureus - MIC*    CIPROFLOXACIN <=0.5 SENSITIVE Sensitive     ERYTHROMYCIN <=0.25 SENSITIVE Sensitive     GENTAMICIN <=0.5 SENSITIVE Sensitive     OXACILLIN 0.5 SENSITIVE Sensitive     TETRACYCLINE <=1 SENSITIVE Sensitive     VANCOMYCIN 1 SENSITIVE Sensitive     TRIMETH/SULFA <=10 SENSITIVE Sensitive     CLINDAMYCIN <=0.25 SENSITIVE Sensitive     RIFAMPIN <=0.5 SENSITIVE Sensitive     Inducible Clindamycin NEGATIVE Sensitive     * RARE STAPHYLOCOCCUS AUREUS  Respiratory Panel by PCR     Status: Abnormal   Collection Time: 07/20/19  8:31 AM   Specimen: Nasopharyngeal Swab; Respiratory  Result Value Ref Range Status   Adenovirus NOT DETECTED NOT DETECTED Final   Coronavirus 229E NOT DETECTED NOT DETECTED Final    Comment: (NOTE) The Coronavirus on the Respiratory Panel, DOES NOT test for the novel  Coronavirus (2019 nCoV)    Coronavirus HKU1 NOT DETECTED NOT DETECTED Final   Coronavirus NL63 NOT DETECTED NOT DETECTED Final   Coronavirus OC43 NOT DETECTED NOT DETECTED Final   Metapneumovirus NOT DETECTED NOT DETECTED Final   Rhinovirus / Enterovirus DETECTED (A) NOT DETECTED Final   Influenza A NOT DETECTED NOT DETECTED Final   Influenza B NOT DETECTED NOT DETECTED Final   Parainfluenza Virus 1  NOT DETECTED NOT DETECTED Final   Parainfluenza Virus 2 NOT DETECTED NOT DETECTED Final   Parainfluenza Virus 3 NOT DETECTED NOT DETECTED Final   Parainfluenza Virus 4 NOT DETECTED NOT DETECTED Final   Respiratory Syncytial Virus NOT DETECTED NOT DETECTED Final   Bordetella pertussis NOT DETECTED NOT DETECTED Final   Chlamydophila pneumoniae NOT DETECTED NOT DETECTED Final   Mycoplasma pneumoniae NOT DETECTED NOT DETECTED Final    Comment: Performed at [facility], [address], [place], [place] [address]         Radiology Studies: No results found.      Scheduled Meds: . acetaminophen  650 mg Rectal Once  . aspirin  81 mg Per Tube  Daily  . budesonide  0.25 mg Nebulization BID  . cefdinir  300 mg Per Tube Q12H  . chlorhexidine  15 mL Mouth Rinse BID  . Chlorhexidine Gluconate Cloth  6 each Topical Daily  . citalopram  10 mg Per Tube Daily  . clopidogrel  75 mg Per Tube Daily  . feeding supplement (VITAL HIGH PROTEIN)  1,000 mL Per Tube Q24H  . finasteride  5 mg Oral Daily  . heparin  5,000 Units Subcutaneous Q8H  . insulin aspart  0-15 Units Subcutaneous Q4H  . lidocaine  1 patch Transdermal Q24H  . mouth rinse  15 mL Mouth Rinse q12n4p  . sodium chloride flush  10-40 mL Intracatheter Q12H   Continuous Infusions: . sodium chloride Stopped (07/24/19 0538)     LOS: 4 days    Time spent: 35 minutes    Jae Dire, DO [facility] Pager [phone]  If 7PM-7AM, please contact night-coverage [email or web address] Password [email or web address] 07/24/2019, 1:22 PM

## 2019-07-24 NOTE — Progress Notes (Signed)
Messaged attending MD re:"Pt complaining of L arm/shoulder pain. Is receiving lidocaine patch, however off for 12 hrs. Can we get prn pain med?"

## 2019-07-24 NOTE — TOC Transition Note (Addendum)
Transition of Care [facility]) - CM/SW Discharge Note   Patient Details  Name: Michael Bright MRN: [id number] Date of Birth: [birth year]  [facility] of Care [facility] CM/SW Contact:  [name], RN Phone Number: 07/24/2019, 12:47 PM   Clinical Narrative:   Pt recently discharged home from [place] - pt was set up with [facility] for [facility] at discharge.  Pt recently received the following equipment prior to his discharge.  Per pts wife ; he has all needed equipment in the home already (tube feeding equipment, suctioning, wheelchair and walker).  [facility] contacted and will resume HH at discharge.  Pts wife will provide 24 hour supervision at discharge   Final next level of care: [place] Barriers to Discharge: Barriers Resolved   Patient Goals and CMS Choice Patient states their goals for this hospitalization and ongoing recovery are:: Pts wife states she is ready for pt to get home and stay home CMS Medicare.gov Compare Post Acute Care list provided to:: Patient Choice offered to / list presented to : Patient  Discharge Placement                       Discharge Plan and Services                          HH Arranged: RN, PT, OT, Speech Therapy HH Agency: [facility] Date [facility] Agency Contacted: 07/24/19 Time [place]: [phone] Representative spoke with at [place]: [place]) Interventions     Readmission Risk Interventions No flowsheet data found.

## 2019-07-24 NOTE — Progress Notes (Signed)
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 Trach decannulation 9/22 and discharged to CIR, then home on 9/28. Presented to [facility] 9/30 with dyspnea, productive cough, dysuria found to have septic shock with UTI/ aspiration pneumonia.Extubated 10/2.    PT Comments    Pt making progress with mobility but not yet able to amb. May need another CIR stay prior to home if he doesn't progress quickly.    Follow Up Recommendations  CIR;Supervision/Assistance - 24 hour     Equipment Recommendations  None recommended by PT    Recommendations for Other Services       Precautions / Restrictions Precautions Precautions: Fall Precaution Comments: PEG Other Brace: L AFO    Mobility  Bed Mobility Overal bed mobility: Needs Assistance Bed Mobility: Supine to Sit     Supine to sit: Mod assist;HOB elevated     General bed mobility comments: Assist to bring legs off of bed, elevate trunk into sitting and bring hips to EOB  Transfers Overall transfer level: Needs assistance Equipment used: Ambulation equipment used Transfers: Sit to/from Stand Sit to Stand: +2 physical assistance;Min assist         General transfer comment: Assist to bring hips up and for balance. Used Stedy for bed to [employer] [place] (Stroke Patients Only)       Balance Overall balance assessment: Needs assistance Sitting-balance support: Feet supported;Single extremity supported Sitting balance-Leahy Scale: Poor Sitting balance - Comments: min guard for static sitting with cues to correct lt lateral lean Postural control: Left lateral lean Standing balance support: Bilateral upper extremity supported Standing  balance-Leahy Scale: Poor Standing balance comment: Stedy and min assist for static standing                            Cognition Arousal/Alertness: Awake/alert Behavior During Therapy: WFL for tasks assessed/performed Overall Cognitive Status: Within Functional Limits for tasks assessed                                        Exercises      General Comments        Pertinent Vitals/Pain Pain Assessment: Faces Faces Pain Scale: Hurts little more Pain Location: Lt shoulder Pain Descriptors / Indicators: Grimacing;Guarding Pain Intervention(s): Monitored during session;Limited activity within patient's tolerance;Repositioned    Home Living                      Prior Function            PT Goals (current goals can now be found in the care plan section) Acute Rehab PT Goals Patient Stated Goal: to return home Progress towards PT goals: Progressing toward goals    Frequency    Min 3X/week      PT Plan Discharge plan needs to be updated    Co-evaluation              AM-PAC PT "6 Clicks" Mobility   Outcome Measure  Help needed  turning from your back to your side while in a flat bed without using bedrails?: A Lot Help needed moving from lying on your back to sitting on the side of a flat bed without using bedrails?: A Lot Help needed moving to and from a bed to a chair (including a wheelchair)?: Total Help needed standing up from a chair using your arms (e.g., wheelchair or bedside chair)?: A Lot Help needed to walk in hospital room?: Total Help needed climbing 3-5 steps with a railing? : Total 6 Click Score: 9    End of Session Equipment Utilized During Treatment: Oxygen Activity Tolerance: Patient tolerated treatment well Patient left: with call bell/phone within reach;in chair;with chair alarm set Nurse Communication: Mobility status;Need for lift equipment(nurse tech present for transfer) PT Visit Diagnosis: Muscle  weakness (generalized) (M62.81);Other abnormalities of gait and mobility (R26.89)     Time: 1026-1100 PT Time Calculation (min) (ACUTE ONLY): 34 min  Charges:  $Therapeutic Activity: 23-37 mins                     [place] Pager [phone] Office [place] 07/24/2019, 4:45 PM

## 2019-07-24 NOTE — Progress Notes (Signed)
Rehab Admissions Coordinator Note:  Per PT recommendation, this patient was screened by Raechel Ache for appropriateness for an Inpatient Acute Rehab Consult. This patient is familiar to me from recent CIR stay (9/8-9/28).  At this time, we are recommending an Inpatient Rehab consult. If pt would like to be considered for CIR again, please have attending MD place an IP Rehab Consult order.   Raechel Ache 07/24/2019, 5:29 PM  I can be reached at [phone].

## 2019-07-24 NOTE — Progress Notes (Signed)
Nutrition Follow-up  DOCUMENTATION CODES:   Morbid obesity  INTERVENTION:    Change TF formula to Osmolite 1.5 at 45 ml/h via PEG   Pro-stat 60 ml TID   Provides 2220 kcal, 158 gm protein, 823 ml free water daily  NUTRITION DIAGNOSIS:   Inadequate oral intake related to inability to eat as evidenced by NPO status.  Ongoing  GOAL:   Patient will meet greater than or equal to 90% of their needs   Met with TF  MONITOR:   Labs, TF tolerance, Skin, I & O's   ASSESSMENT:   63 yo male admitted with dyspnea, severe sepsis, UTI vs CAP. Intubated in the ED. PMH includes DM, recent CVA, trach s/p decannulation 9/22, G-tube.   Discussed patient in ICU rounds and with RN today. Extubated 10/2. Remains NPO with hx of dysphagia. G tube in place. Receiving Vital High Protein at 80 ml/h providing 1920 kcal, 168 gm protein, 1605 ml free water daily.  10/1 Respiratory virus panel + rhino/enterovirus.  10/2 Extubated.  Labs reviewed.  CBG's: [phone]  Medications reviewed and include novolog.  I/O +3 L since admission. Weight stable.  NUTRITION - FOCUSED PHYSICAL EXAM:  unable to complete  Diet Order:   Diet Order            Diet NPO time specified  Diet effective now              EDUCATION NEEDS:   No education needs have been identified at this time  Skin:  Skin Assessment: Reviewed RN Assessment  Last BM:  10/3 type 7  Height:   Ht Readings from Last 1 Encounters:  07/19/19 5' 10" (1.778 m)    Weight:   Wt Readings from Last 1 Encounters:  07/24/19 (!) 141.6 kg    Ideal Body Weight:  75.5 kg  BMI:  Body mass index is 44.79 kg/m.  Estimated Nutritional Needs:   Kcal:  0240-9735  Protein:  140-160 gm  Fluid:  >/= 2 L    Molli Barrows, RD, LDN, [place] Pager [phone] After Hours Pager [phone]

## 2019-07-25 LAB — BASIC METABOLIC PANEL
Anion gap: 7 (ref 5–15)
BUN: 21 mg/dL (ref 8–23)
CO2: 28 mmol/L (ref 22–32)
Calcium: 8.9 mg/dL (ref 8.9–10.3)
Chloride: 104 mmol/L (ref 98–111)
Creatinine, Ser: 0.69 mg/dL (ref 0.61–1.24)
GFR calc Af Amer: >60 mL/min (ref >60)
GFR calc non Af Amer: >60 mL/min (ref >60)
Glucose, Bld: 201 mg/dL — ABNORMAL HIGH (ref 70–99)
Potassium: 4.1 mmol/L (ref 3.5–5.1)
Sodium: 139 mmol/L (ref 135–145)

## 2019-07-25 LAB — GLUCOSE, CAPILLARY
Glucose-Capillary: 142 mg/dL — ABNORMAL HIGH (ref 70–99)
Glucose-Capillary: 155 mg/dL — ABNORMAL HIGH (ref 70–99)
Glucose-Capillary: 155 mg/dL — ABNORMAL HIGH (ref 70–99)
Glucose-Capillary: 187 mg/dL — ABNORMAL HIGH (ref 70–99)
Glucose-Capillary: 190 mg/dL — ABNORMAL HIGH (ref 70–99)
Glucose-Capillary: 200 mg/dL — ABNORMAL HIGH (ref 70–99)

## 2019-07-25 LAB — CULTURE, BLOOD (ROUTINE X 2)
Culture: NO GROWTH
Special Requests: ADEQUATE

## 2019-07-25 LAB — CBC
HCT: 35.6 % — ABNORMAL LOW (ref 39.0–52.0)
Hemoglobin: 11.4 g/dL — ABNORMAL LOW (ref 13.0–17.0)
MCH: 29.9 pg (ref 26.0–34.0)
MCHC: 32 g/dL (ref 30.0–36.0)
MCV: 93.4 fL (ref 80.0–100.0)
Platelets: 246 10*3/uL (ref 150–400)
RBC: 3.81 MIL/uL — ABNORMAL LOW (ref 4.22–5.81)
RDW: 12.6 % (ref 11.5–15.5)
WBC: 12.6 10*3/uL — ABNORMAL HIGH (ref 4.0–10.5)
nRBC: 0.2 % (ref 0.0–0.2)

## 2019-07-25 NOTE — Progress Notes (Signed)
Inpatient Rehabilitation-Admissions Coordinator   I have started insurance authorization process for possible admit.   Jhonnie Garner, OTR/L  Rehab Admissions Coordinator  [phone] 07/25/2019 4:03 PM

## 2019-07-25 NOTE — PMR Pre-admission (Signed)
PMR Admission Coordinator Pre-Admission Assessment  Patient: Michael Bright is an 63 y.o., male MRN: [id number] DOB: [birth year] Height: 6' (182.9 cm) Weight: (!) 138.7 kg  Insurance Information HMO:     PPO: yes     PCP:      IPA:      80/20:      OTHER:  PRIMARY: [employer] [place] with [employer]      Policy#: [id number]      Subscriber: Patient's wife Michael Bright) Michael Bright: Michael Bright      Phone#: [phone]     Fax#: [phone] Pre-Cert#: [id number]      Employer:  [employer] provided by Michael Bright for admit to CIR for 7 days (10/7-10/13). Follow up CM has yet to be assigned. Fax clinical updates to (f): [phone] and it will be assigned to an RN Benefits:  Phone #: [phone] and transferred to [phone]     Bright:  Eff. Date: 10/19/2018 - still active     Deduct: $200 ($200 met)      Out of Pocket Max: $500 ($500 met)      Life Max: NA CIR: 80% coverage, 20% co-insurnace; limited by medical necessity via prior auth      SNF: 80% coverage, 20% co-insurance, limited by medical necessity via prior auth Outpatient: 80% coverage, 20% co-insurance; limited by medical necessity via prior [place]: 80% coverage; 20% co-insurance; limited by medical necessity via prior auth     DME: 80% coverage; 20% co-insurance; limited by medical necessity via prior auth   Providers:  SECONDARY: Medicare part A and B      Policy#: [id number]      Subscriber: Patient CM Bright:       Phone#:      Fax#:  Pre-Cert#:       Employer:  Benefits:  Phone #: verified eligibility via [place] on 07/26/2019     Bright: [employer] Eff. Date: Part A and B effective 10/19/2001     Deduct:       Out of Pocket Max:       Life Max:  CIR:       SNF:  Outpatient:      Co-Pay:  Home Health:       Co-Pay:  DME:      Co-Pay:   Medicaid Application Date:       Case Manager:  Disability Application Date:       Case Worker:   The "Data Collection Information Summary" for patients in Inpatient Rehabilitation Facilities with  attached "Privacy Act [place] Records" was provided and verbally reviewed with: Patient  Emergency Contact Information Contact Information    Bright Relation Home Work Mobile   Aultman,Michael Bright    [phone]      Current Medical History  Patient Admitting Diagnosis: Debility due to multiple acute medical conditions   History of Present Illness: Michael Bright is a 63 year old male with history of T2DM, OSA, morbid obesity, 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 He tolerated decannulation by 09/22 and oral secretions were being managed with use of robinul and guaifenesin DM. He was discharged to home NPO with CGA for mobility due to dizziness and ataxia. He was readmitted on 07/20/19 with dyspnea with increase WOB requiring intubation and was found to  have septic shock with hypotension and AKI.  He was treated with broad spectrum antibiotics with IVF and found to have Klebsiella pneumoniae bacteremia with UTI and bilateral lung infiltrates due to rhino virus. He tolerated extubation by 10/2 and cefazolin narrowed to cefdinir on 10/05. Intermittent hypoxic episodes resolving and acute respiratory failure felt to bed in secondary to rhino virus in setting of sever dysphagia. Abnormal LFTs due to shocked liver as well as AKI have resolved. Therapy evaluation done revealing debility and CIR recommended for follow up therapy. Pt is to be admitted to CIR on 07/26/2019.    Patient's medical record from [facility] has been reviewed by the rehabilitation admission coordinator and physician.  Past Medical History  Past Medical History:  Diagnosis Date  . Diabetes [facility])     Family History    family history is not on file.  Prior Rehab/Hospitalizations Has the patient had prior rehab or hospitalizations prior to admission? Yes  Has the patient had major surgery during 100 days prior to admission? Yes   Current Medications  Current Facility-Administered Medications:  .  0.9 %  sodium chloride infusion, , Intravenous, PRN, Parrett, Tammy S, NP, Last Rate: 10 mL/hr at 07/24/19 2000 .  acetaminophen (TYLENOL) suppository 650 mg, 650 mg, Rectal, Once, Parrett, Tammy S, NP .  acetaminophen (TYLENOL) tablet 650 mg, 650 mg, Per Tube, Q6H PRN, Harold Hedge, MD, 650 mg at 07/26/19 0458 .  aspirin chewable tablet 81 mg, 81 mg, Per Tube, Daily, Parrett, Tammy S, NP, 81 mg at 07/26/19 0847 .  bisacodyl (DULCOLAX) suppository 10 mg, 10 mg, Rectal, Daily PRN, Parrett, Tammy S, NP .  budesonide (PULMICORT) nebulizer solution 0.25 mg, 0.25 mg, Nebulization, BID, Parrett, Tammy S, NP, 0.25 mg at 07/26/19 0850 .  cefdinir (OMNICEF) capsule 300 mg, 300 mg, Per Tube, Q12H, Harold Hedge, MD, 300 mg at 07/26/19 0846 .  chlorhexidine (PERIDEX) 0.12 % solution 15 mL, 15 mL, Mouth Rinse, BID, Harold Hedge, MD, 15 mL at 07/26/19 0847 .  Chlorhexidine Gluconate Cloth 2 % PADS 6 each, 6 each, Topical, Daily, Parrett, Tammy S, NP, 6 each at 07/25/19 1030 .  citalopram (CELEXA) tablet 10 mg, 10 mg, Per Tube, Daily, Parrett, Tammy S, NP, 10 mg at 07/26/19 0846 .  clopidogrel (PLAVIX) tablet 75 mg, 75 mg, Per Tube, Daily, Parrett, Tammy S, NP, 75 mg at 07/26/19 0846 .  feeding supplement (OSMOLITE 1.5 CAL) liquid 1,000 mL, 1,000 mL, Per Tube, Continuous, Harold Hedge, MD, Last Rate: 45 mL/hr at 07/26/19 1539, 1,000 mL at 07/26/19 1539 .  feeding supplement (PRO-STAT SUGAR FREE 64) liquid 60 mL, 60 mL, Oral, TID, Harold Hedge, MD, 60 mL at 07/26/19 0847 .  finasteride (PROSCAR) tablet 5 mg, 5 mg, Oral, Daily, Parrett, Tammy S, NP, 5 mg at 07/26/19 0847 .  guaiFENesin (MUCINEX) 12 hr tablet 600 mg, 600  mg, Oral, BID, Regalado, Belkys A, MD, 600 mg at 07/26/19 1309 .  guaiFENesin-dextromethorphan (ROBITUSSIN DM) 100-10 MG/5ML syrup 5 mL, 5 mL, Oral, Q4H PRN, Regalado, Belkys A, MD, 5 mL at 07/26/19 1252 .  heparin injection 5,000 Units, 5,000 Units, Subcutaneous, Q8H, Parrett, Tammy S, NP, 5,000 Units at 07/26/19 1320 .  insulin aspart (novoLOG) injection 0-15 Units, 0-15 Units, Subcutaneous, Q4H, Parrett, Tammy S, NP, 2 Units at 07/26/19 1215 .  ipratropium-albuterol (DUONEB) 0.5-2.5 (3) MG/3ML nebulizer solution 3 mL, 3 mL, Nebulization, Q6H, Regalado, Belkys A, MD, 3 mL at 07/26/19  1429 .  lidocaine (LIDODERM) 5 % 1 patch, 1 patch, Transdermal, Q24H, Harold Hedge, MD, 1 patch at 07/25/19 2033 .  MEDLINE mouth rinse, 15 mL, Mouth Rinse, q12n4p, Harold Hedge, MD, 15 mL at 07/26/19 1217 .  sennosides (SENOKOT) 8.8 MG/5ML syrup 5 mL, 5 mL, Per Tube, BID PRN, Parrett, Tammy S, NP, 5 mL at 07/21/19 1019 .  sodium chloride flush (NS) 0.9 % injection 10-40 mL, 10-40 mL, Intracatheter, Q12H, Parrett, Tammy S, NP, 10 mL at 07/26/19 0849 .  sodium chloride flush (NS) 0.9 % injection 10-40 mL, 10-40 mL, Intracatheter, PRN, Parrett, Tammy S, NP, 10 mL at 07/20/19 0958  Patients Current Diet:  Diet Order            Diet - low sodium heart healthy        Diet NPO time specified  Diet effective now              Precautions / Restrictions Precautions Precautions: Fall Precaution Comments: PEG Other Brace: L AFO Restrictions Weight Bearing Restrictions: No   Has the patient had 2 or more falls or a fall with injury in the past year? No  Prior Activity Level Limited Community (1-2x/wk): recent DC home from CIR. Supervision/Min A level per family/pt report for most ambulation/ADLs; use of RW for ambulation. no falls at home prior to most recent admission  Prior Functional Level Self Care: Did the patient need help bathing, dressing, using the toilet or eating? Needed some help  Indoor  Mobility: Did the patient need assistance with walking from room to room (with or without device)? Needed some help  Stairs: Did the patient need assistance with internal or external stairs (with or without device)? Needed some help  Functional Cognition: Did the patient need help planning regular tasks such as shopping or remembering to take medications? Needed some help  Home Assistive Devices / Equipment Home Equipment: Walker - 2 wheels, Tub bench  Prior Device Use: Indicate devices/aids used by the patient prior to current illness, exacerbation or injury? Walker  Current Functional Level Cognition  Overall Cognitive Status: Within Functional Limits for tasks assessed Current Attention Level: Sustained Orientation Level: Oriented X4 Following Commands: Follows one step commands consistently, Follows one step commands with increased time General Comments: appears WFL for tasks assessed, further assessment warrented    Extremity Assessment (includes Sensation/Coordination)  Upper Extremity Assessment: LUE deficits/detail, Generalized weakness LUE Deficits / Details: limited shoulder flexion 2-/5 (painful), elbow 3/5; limited [place]  LUE Sensation: decreased light touch LUE Coordination: decreased fine motor, decreased gross motor  Lower Extremity Assessment: Defer to PT evaluation    ADLs  Overall ADL's : Needs assistance/impaired Grooming: Minimal assistance, Sitting Upper Body Bathing: Minimal assistance, Sitting Lower Body Bathing: Maximal assistance, +2 for physical assistance, Sit to/from stand Upper Body Dressing : Moderate assistance, Sitting Lower Body Dressing: Total assistance, +2 for physical assistance, Sit to/from stand Toilet Transfer: Moderate assistance, Maximal assistance, +2 for physical assistance, Stand-pivot, BSC Toilet Transfer Details (indicate cue type and reason): using stedy Toileting- Clothing Manipulation and Hygiene: Total assistance, +2 for physical  assistance, Sit to/from stand Functional mobility during ADLs: Moderate assistance, Maximal assistance, +2 for physical assistance General ADL Comments: pt limited by generalized weakness, decreased activity tolerance, L shoulder pain and decreased functional use of UE, impaired balance     Mobility  Overal bed mobility: Needs Assistance Bed Mobility: Sit to Supine Supine to sit: HOB elevated, Min assist Sit to supine: Max  assist, +2 for physical assistance General bed mobility comments: requires min assist for UE/trunk, max assist for LB when returning to supine     Transfers  Overall transfer level: Needs assistance Equipment used: Rolling walker (2 wheeled) Transfer via Lift Equipment: Stedy Transfers: Sit to/from Stand, [employer] Transfers Sit to Stand: Mod assist, Max assist Stand pivot transfers: Total assist, +2 physical assistance General transfer comment: patient stood from [facility] with RW, mod-max assist but unable to sustain stand during toileting hygiene; utilized stedy to achieve full stand with mod assist; utilized steady to pivot to EOB     Ambulation / Gait / Stairs / Office manager / Balance Dynamic Sitting Balance Sitting balance - Comments: supervision seated on commode upon entry to room Balance Overall balance assessment: Needs assistance Sitting-balance support: Feet supported Sitting balance-Leahy Scale: Fair Sitting balance - Comments: supervision seated on commode upon entry to room Postural control: Left lateral lean Standing balance support: Bilateral upper extremity supported, During functional activity Standing balance-Leahy Scale: Poor Standing balance comment: relaint on BUE and external support, noted L lateral lean when fatiguing in standing     Special needs/care consideration BiPAP/CPAP : yes, CPAP CPM : no Continuous Drip IV : feeding supplement (Osmolite 1.5 CAL) Dialysis : no        Days : no Life Vest : no Oxygen : on  RA Special Bed : no Trach Size : no (decannulated last admission) Wound Vac (area) : no      Location : no Skin: ecchymosis, MASD: right and left groin; PEG tube  Bowel mgmt: Last BM: 10/3, continent Bladder mgmt: continent Diabetic mgmt: yes Behavioral consideration : no Chemo/radiation : no   Previous Home Environment (from acute therapy documentation) Living Arrangements: Spouse/significant other  Lives With: Spouse Available Help at Discharge: Family, Available 24 hours/day Type of Home: House Home Layout: Two level, Able to live on main level with bedroom/bathroom Alternate Level Stairs-Number of Steps: basement, but does not go in Home Access: Ramped entrance Entrance Stairs-Number of Steps: 4 stairs Bathroom Shower/Tub: Chiropodist: Handicapped height  Discharge Living Setting Plans for Discharge Living Setting: Patient's home, Lives with (comment)(wife and son) Type of Home at Discharge: House Discharge Home Layout: Two level, Able to live on main level with bedroom/bathroom(basement is "second level." no need to utilize basement) Alternate Level Stairs-Rails: None(NA) Alternate Level Stairs-Number of Steps: NA Discharge Home Access: Ramped entrance Discharge Bathroom Shower/Tub: Tub/shower unit Discharge Bathroom Toilet: Handicapped height Discharge Bathroom Accessibility: Yes How Accessible: Accessible via wheelchair, Accessible via walker Does the patient have any problems obtaining your medications?: No  Social/Family/Support Systems Patient Roles: Spouse, Parent Contact Information: spouse: Michael Bright [phone]) Anticipated Caregiver: wife and son (daugther lives nearby) Anticipated Ambulance person Information: see above Ability/Limitations of Caregiver: Min A for wife (wife has lifting restrictions of <5lbs due to cervical injury); son able to physically assist (both wife and son are on FMLA) Caregiver Availability: 24/7 Discharge  Plan Discussed with Primary Caregiver: Yes Is Caregiver In Agreement with Plan?: Yes Does Caregiver/Family have Issues with Lodging/Transportation while Pt is in Rehab?: No  Goals/Additional Needs Patient/Family Goal for Rehab: PT/OT: Supervision/Min G; SLP: Supervision Expected length of stay: 10-14 days Cultural Considerations: NA Dietary Needs: NPO; PEG  Equipment Needs: TBD Special Service Needs: PEG, suction, CPAP  Pt/Family Agrees to Admission and willing to participate: Yes Program Orientation Provided & Reviewed with Pt/Caregiver Including Roles  & Responsibilities: Yes(pt and  wife)  Barriers to Discharge: Medical stability, Insurance for SNF coverage  Barriers to Discharge Comments: pt only home 2 days prior to return to hospital with septic shock.   Decrease burden of Care through IP rehab admission: Other   Possible need for SNF placement upon discharge: Not anticipated; pt and family understand the plan is home after CIR admission. Pt has great family support at both his wife and son have taken FMLA to assist him. Pt lives in an accessible home for entry and has good prognosis for further progress through CIR.   Patient Condition: I have reviewed medical records from [facility], spoken with MD, and patient and spouse. I met with patient at the bedside for inpatient rehabilitation assessment.  Patient will benefit from ongoing PT, OT and SLP, can actively participate in 3 hours of therapy a day 5 days of the week, and can make measurable gains during the admission.  Patient will also benefit from the coordinated team approach during an Inpatient Acute Rehabilitation admission.  The patient will receive intensive therapy as well as Rehabilitation physician, nursing, social worker, and care management interventions.  Due to bladder management, safety, skin/wound care, disease management, medication administration, pain management and patient education the patient  requires 24 hour a day rehabilitation nursing.  The patient is currently Mod A x2 for transfers no ambulation yet and Min to total A x2 for basic ADLs.  Discharge setting and therapy post discharge at home with home health is anticipated.  Patient has agreed to participate in the Acute Inpatient Rehabilitation Program and will admit 07/26/2019.  Preadmission Screen Completed By:  Raechel Ache, 07/26/2019 4:11 PM ______________________________________________________________________   Discussed status with Dr. Dagoberto Ligas on 07/26/2019 at 4:09PM and received approval for admission today.  Admission Coordinator:  Raechel Ache, OT, time 4:09PM/Date 07/26/2019   Assessment/Plan: Diagnosis: 1. Does the need for close, 24 hr/day Medical supervision in concert with the patient's rehab needs make it unreasonable for this patient to be served in a less intensive setting? Yes 2. Co-Morbidities requiring supervision/potential complications: [place], NPO due to dysphagia, residual L hemiparesis, DM, on IV ABX for sepsis/asp pneumonia/UTI 3. Due to bladder management, bowel management, safety, disease management, medication administration and patient education, does the patient require 24 hr/day rehab nursing? Yes 4. Does the patient require coordinated care of a physician, rehab nurse, PT, OT, and SLP 1-2 hours/day 5 days/week to address physical and functional deficits in the context of the above medical diagnosis(es)? Yes Addressing deficits in the following areas: balance, endurance, locomotion, strength, bowel/bladder control, bathing, dressing, feeding, toileting, swallowing and psychosocial support 5. Can the patient actively participate in an intensive therapy program of at least 3 hrs of therapy 5 days a week? Yes 6. The potential for patient to make measurable gains while on inpatient rehab is good 7. Anticipated functional outcomes upon discharge from inpatient rehab: min assist PT, min assist OT, mod  assist SLP 8. Estimated rehab length of stay to reach the above functional goals is: 10-14 days 9. Anticipated discharge destination: Home 10. Overall Rehab/Functional Prognosis: fair   MD Signature:

## 2019-07-25 NOTE — Progress Notes (Signed)
Physical Therapy Treatment Patient Details Name: Michael Bright MRN: [id number] DOB: 1956/02/05 Today's Date: 07/25/2019    History of Present Illness 63yM with DM, recent left medullary CVA complicated with trach (8/21) and g-tube placement. Trach decannulation 9/22 and discharged to CIR, then home on 9/28. Presented to [facility] 9/30 with dyspnea, productive cough, dysuria found to have septic shock with UTI/ aspiration pneumonia.Extubated 10/2.    PT Comments    Patient seen for mobility progression. Pt is eager to participate in therapy. Pt requires mod A for bed mobility and functional transfer training with RW (+2 for safety). Pt presents with generalized weakness and fatigues quickly when in standing. Current plan remains appropriate.     Follow Up Recommendations  CIR;Supervision/Assistance - 24 hour     Equipment Recommendations  None recommended by PT    Recommendations for Other Services       Precautions / Restrictions Precautions Precautions: Fall Precaution Comments: PEG Other Brace: L AFO Restrictions Weight Bearing Restrictions: No    Mobility  Bed Mobility Overal bed mobility: Needs Assistance Bed Mobility: Supine to Sit     Supine to sit: HOB elevated;Min assist     General bed mobility comments: use of rails and HOB elevated; assist to scoot hips to EOB  Transfers Overall transfer level: Needs assistance Equipment used: Rolling walker (2 wheeled) Transfers: Sit to/from [employer] Sit to Stand: Mod assist;From elevated surface;+2 safety/equipment Stand pivot transfers: Mod assist;+2 safety/equipment       General transfer comment: pt stood X 2 trials from elevated bed height requiring mod A to power up into standing; use of momentum; pt able to stand pivot bed to recliner with assistance for balance and pt with bilat knee buckling as soon as getting in front of chair and requires assist for safe descent  Ambulation/Gait                  Stairs             Wheelchair Mobility    Modified Rankin (Stroke Patients Only)       Balance Overall balance assessment: Needs assistance Sitting-balance support: Feet supported;Single extremity supported Sitting balance-Leahy Scale: Poor     Standing balance support: Bilateral upper extremity supported Standing balance-Leahy Scale: Poor Standing balance comment: attempted pre gait activities and pt fatigued very quickly                            Cognition Arousal/Alertness: Awake/alert Behavior During Therapy: WFL for tasks assessed/performed Overall Cognitive Status: Within Functional Limits for tasks assessed                                        Exercises      General Comments        Pertinent Vitals/Pain Pain Assessment: Faces Faces Pain Scale: Hurts a little bit Pain Location: feet Pain Descriptors / Indicators: Sore Pain Intervention(s): Limited activity within patient's tolerance;Monitored during session;Repositioned    Home Living                      Prior Function            PT Goals (current goals can now be found in the care plan section) Acute Rehab PT Goals Patient Stated Goal: to return home Progress towards PT goals: Progressing toward  goals    Frequency    Min 3X/week      PT Plan Current plan remains appropriate    Co-evaluation              AM-PAC PT "6 Clicks" Mobility   Outcome Measure  Help needed turning from your back to your side while in a flat bed without using bedrails?: A Lot Help needed moving from lying on your back to sitting on the side of a flat bed without using bedrails?: A Lot Help needed moving to and from a bed to a chair (including a wheelchair)?: A Lot Help needed standing up from a chair using your arms (e.g., wheelchair or bedside chair)?: A Lot Help needed to walk in hospital room?: Total Help needed climbing 3-5 steps with a railing? :  Total 6 Click Score: 10    End of Session Equipment Utilized During Treatment: Gait belt;Other (comment)(SpO2 90% on RA) Activity Tolerance: Patient tolerated treatment well Patient left: with call bell/phone within reach;in chair;with chair alarm set Nurse Communication: Mobility status PT Visit Diagnosis: Muscle weakness (generalized) (M62.81);Other abnormalities of gait and mobility (R26.89)     Time: 2119-4174 PT Time Calculation (min) (ACUTE ONLY): 21 min  Charges:  $Gait Training: 8-22 mins                     Earney Navy, PTA Acute Rehabilitation Services Pager: [phone] Office: [phone]     Darliss Cheney 07/25/2019, 1:00 PM

## 2019-07-25 NOTE — Evaluation (Signed)
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 Trach decannulation 9/22 and discharged to CIR, then home on 9/28. Presented to [facility] 9/30 with dyspnea, productive cough, dysuria found to have septic shock with UTI/ aspiration pneumonia.Extubated 10/2.   Clinical Impression   Patient admitted for above, reports PTA needing a little assist with self care, using rolling walker for mobility.  He was recently at [facility] and returned home for 2 days.  He is currently limited by L shoulder pain/decreased functional use of L UE, weakness, impaired balance, decreased activity tolerance.  He requires min-mod assist for UB ADLs, max to total assist for LB ADLs, total assist +2 for toileting, mod-max assist +2 for transfers using stedy.   He will benefit from continued OT services while admitted and after dc at CIR level in order to maximize independence and safety with ADLs and mobility.     Follow Up Recommendations  CIR;Supervision/Assistance - 24 hour    Equipment Recommendations  3 in 1 bedside commode    Recommendations for Other Services Rehab consult     Precautions / Restrictions Precautions Precautions: Fall Precaution Comments: PEG Other Brace: L AFO Restrictions Weight Bearing Restrictions: No      Mobility Bed Mobility Overal bed mobility: Needs Assistance Bed Mobility: Sit to Supine     Supine to sit: HOB elevated;Min assist Sit to supine: Max assist;+2 for physical assistance   General bed mobility comments: requires min assist for UE/trunk, max assist for LB when returning to supine   Transfers Overall transfer level: Needs assistance Equipment used: Rolling walker (2 wheeled) Transfers: Sit to/from [employer] Sit to Stand: Mod assist;Max assist Stand pivot  transfers: Total assist;+2 physical assistance       General transfer comment: patient stood from [facility] with RW, mod-max assist but unable to sustain stand during toileting hygiene; utilized stedy to achieve full stand with mod assist; utilized steady to pivot to EOB     Balance Overall balance assessment: Needs assistance Sitting-balance support: Feet supported Sitting balance-Leahy Scale: Fair Sitting balance - Comments: supervision seated on commode upon entry to room   Standing balance support: Bilateral upper extremity supported;During functional activity Standing balance-Leahy Scale: Poor Standing balance comment: relaint on BUE and external support, noted L lateral lean when fatiguing in standing                            ADL either performed or assessed with clinical judgement   ADL Overall ADL's : Needs assistance/impaired     Grooming: Minimal assistance;Sitting   Upper Body Bathing: Minimal assistance;Sitting   Lower Body Bathing: Maximal assistance;+2 for physical assistance;Sit to/from stand   Upper Body Dressing : Moderate assistance;Sitting   Lower Body Dressing: Total assistance;+2 for physical assistance;Sit to/from stand   Toilet Transfer: Moderate assistance;Maximal assistance;+2 for physical assistance;Stand-pivot;BSC Toilet Transfer Details (indicate cue type and reason): using stedy Toileting- Clothing Manipulation and Hygiene: Total assistance;+2 for physical assistance;Sit to/from stand       Functional mobility during ADLs: Moderate assistance;Maximal assistance;+2 for physical assistance General ADL Comments: pt limited by generalized weakness, decreased activity tolerance, L shoulder pain and decreased functional use of UE, impaired balance      Vision   Additional Comments: further assessment needed     Perception  Praxis      Pertinent Vitals/Pain Pain Assessment: Faces Faces Pain Scale: Hurts a little bit Pain Location:  feet Pain Descriptors / Indicators: Sore Pain Intervention(s): Monitored during session;Repositioned     Hand Dominance Right   Extremity/Trunk Assessment Upper Extremity Assessment Upper Extremity Assessment: LUE deficits/detail;Generalized weakness LUE Deficits / Details: limited shoulder flexion 2-/5 (painful), elbow 3/5; limited [place]  LUE Sensation: decreased light touch LUE Coordination: decreased fine motor;decreased gross motor   Lower Extremity Assessment Lower Extremity Assessment: Defer to PT evaluation       Communication Communication Communication: No difficulties   Cognition Arousal/Alertness: Awake/alert Behavior During Therapy: WFL for tasks assessed/performed Overall Cognitive Status: Within Functional Limits for tasks assessed                                 General Comments: appears WFL for tasks assessed, further assessment warrented   General Comments  spouse present and supportive    Exercises     Shoulder Instructions      Home Living Family/patient expects to be discharged to:: Private residence Living Arrangements: Spouse/significant other Available Help at Discharge: Family;Available 24 hours/day Type of Home: House Home Access: Ramped entrance Entrance Stairs-Number of Steps: 4 stairs   Home Layout: Two level;Able to live on main level with bedroom/bathroom Alternate Level Stairs-Number of Steps: basement, but does not go in   [employer] Shower/Tub: Tub/shower unit   Bathroom Toilet: Handicapped height     Home Equipment: Environmental consultant - 2 wheels;Tub bench      Lives With: Spouse    Prior Functioning/Environment Level of Independence: Independent with assistive device(s)        Comments: was home for two days walking with RW on his own, assist for socks, shoes, showering with HHshower and tub bench        OT Problem List: Decreased strength;Decreased range of motion;Decreased activity tolerance;Impaired balance (sitting  and/or standing);Decreased coordination;Decreased safety awareness;Decreased cognition;Decreased knowledge of use of DME or AE;Decreased knowledge of precautions;Cardiopulmonary status limiting activity;Pain;Obesity;Impaired sensation;Impaired UE functional use      OT Treatment/Interventions: Self-care/ADL training;Therapeutic exercise;DME and/or AE instruction;Neuromuscular education;Therapeutic activities;Cognitive remediation/compensation;Patient/family education;Balance training    OT Goals(Current goals can be found in the care plan section) Acute Rehab OT Goals Patient Stated Goal: to get to rehab then home  OT Goal Formulation: With patient/family Time For Goal Achievement: 08/08/19 Potential to Achieve Goals: Good  OT Frequency: Min 2X/week   Barriers to D/C:            Co-evaluation              AM-PAC OT "6 Clicks" Daily Activity     Outcome Measure Help from another person eating meals?: Total Help from another person taking care of personal grooming?: A Little Help from another person toileting, which includes using toliet, bedpan, or urinal?: Total Help from another person bathing (including washing, rinsing, drying)?: A Lot Help from another person to put on and taking off regular upper body clothing?: A Lot Help from another person to put on and taking off regular lower body clothing?: Total 6 Click Score: 10   End of Session Equipment Utilized During Treatment: Gait belt;Rolling walker;Other (comment)(stedy) Nurse Communication: Mobility status  Activity Tolerance: Patient tolerated treatment well Patient left: in bed;with call bell/phone within reach;with bed alarm set;with family/visitor present  OT Visit Diagnosis: Other abnormalities of gait and mobility (R26.89);Muscle weakness (generalized) (M62.81);Pain Pain -  Right/Left: Left Pain - part of body: Shoulder                Time: 1440-1511 OT Time Calculation (min): 31 min Charges:  OT General  Charges $OT Visit: 1 Visit OT Evaluation $OT Eval Moderate Complexity: 1 Mod OT Treatments $Self Care/Home Management : 8-22 mins  Chancy Milroyhristie S Karem Farha, OT Acute Rehabilitation Services Pager [phone] Office [phone]   Chancy MilroyChristie S Lynnlee Revels 07/25/2019, 3:30 PM

## 2019-07-25 NOTE — Progress Notes (Addendum)
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  Trach decannulation 9/22 and discharged to [facility] 9/28.  He presented to [facility] on 9/30 with dyspnea, productive cough and dysuria found to have septic shock with UTI/aspiration pneumonia and given greater than 3.5 L normal saline in ED and started on empiric antibiotics.  Initially on BiPAP however his clinical status continued to worsen and patient was intubated and started on norepinephrine and was admitted to the ICU.  Patient was weaned off vasopressors and extubated on 10/2 and continued on Ancef.  Hospitalist took over care on 10/4. Patient is medically stable for discharge on 10/6 and pending placement at CIR.   Assessment & Plan:   Active Problems:   Septic shock (HCC)   1. Septic shock secondary to Klebsiella bacteremia and UTI in combination with rhinovirus afebrile and hemodynamically stable with improving leukocytosis. Required Levophed, off vasopressors and extubated 10/2.  Day 7/14 antibiotics. Ancef switched to Cefdinir 300 mg PO BID 10/5 1. Continue Cefdinir 2. Multifocal pneumonia improving. Still with respiratory secretions requiring suctioning with intermittent hypoxic episodes and CPAP nightly 1. Continue pulmonary toilet 2. Continue antibiotics 3. Mobilize as able 4. Pulmicort twice daily  5. Titrate oxygen as appropriate 3. Acute hypoxemic respiratory failure in setting of rhinovirus requiring intubation improving.  As above 4. AKI resolved continue to monitor 5. 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  Had trach decannulation on 9/22 and discharged to [facility] on 9/28 prior to this admission as above.  Has residual left upper extremity weakness most notably in abductors and residual left lower extremity  weakness. Lipitor on hold in setting of transaminitis. 1. Medically stable for discharge to CIR pending PT OT and insurance 2. Continue aspirin 6. Lower extremity edema TED hose 7. Diabetes stable.  Continue sliding scale 8. Transaminitis improved.  Likely multifactorial in setting of septic shock and viral infection.  Monitor   DVT prophylaxis: Heparin Code Status: Full code Disposition Plan: Patient is medically stable for placement at CIR.  Awaiting OT eval and insurance approval.  Consultants:   PCCM  Procedures:  10/1- central line placed 10/1- intubated 10/2- extubated   Micro Data:  Blood 9/30 > 1/2 blood Cx kleb pna Urine 9/30 > 100k GNR>Klebsiella Pna  Tracheal aspirate 9/30 > rare GPC and PMN predominance RVP 10/1 > positive for rhinovirus/ enterovirus Strep urinary antigen 10/1 NEG  Legionella 10/1 NEG  Antimicrobials:  Cefepime 9/30 >10/1 Vancomycin 9/30 >10/1 Flagyl 9/30 >10/1  CTX 10/2>10/2  Cefazolin 10/3 >> 10/5 Cefdinir 10/5   Subjective: Patient seen at the bedside in no acute distress resting comfortably wearing his overnight CPAP machine.  States he is feeling better but still with respiratory secretions that are difficult to clear.  Denies any complaints at this time other than lower extremity swelling.  Denies any chest pain or shortness of breath.  He is interested in being discharged to CIR.  After speaking with his wife on the phone this evening, his wife states that he seemed more tired this evening than before.  After reviewing the record, there does not seem to be any striking reason for him to be more tired this evening.  I explained to her this is likely from having an infection and being in the hospital  for so long as well as having a recent stroke and that he should start to improve once he is able to get up with physical therapy to continue to monitor.  Objective: Vitals:   07/25/19 0518 07/25/19 0802 07/25/19 0901 07/25/19 1222  BP:  104/65 (!) 115/54  125/75  Pulse: 84 85  89  Resp: 18 20  20   Temp: 97.9 F (36.6 C) 99.4 F (37.4 C)  98.1 F (36.7 C)  TempSrc: Oral Oral  Oral  SpO2: 95% 97% 92% 97%  Weight: (!) 140 kg     Height:        Intake/Output Summary (Last 24 hours) at 07/25/2019 1334 Last data filed at 07/25/2019 0803 Gross per 24 hour  Intake 1007.11 ml  Output 775 ml  Net 232.11 ml   Filed Weights   07/24/19 0410 07/24/19 2156 07/25/19 0518  Weight: (!) 141.6 kg (!) 140.4 kg (!) 140 kg    Examination:  General exam: Appears calm and comfortable awake and alert Respiratory system: Productive cough respiratory effort normal. Cardiovascular system: S1 & S2 heard, RRR. No JVD, murmurs, rubs, gallops or clicks. Gastrointestinal system: Abdomen is nondistended, soft and nontender. No organomegaly or masses felt. Normal bowel sounds heard. Central nervous system: Alert and oriented. No focal neurological deficits. Extremities: Bilateral lower extremity edema Skin: No rashes, lesions or ulcers Psychiatry: Judgement and insight appear normal. Mood & affect appropriate.     Data Reviewed: I have personally reviewed following labs and imaging studies  CBC: Recent Labs  Lab 07/19/19 2110  07/21/19 0230 07/22/19 0235 07/23/19 0230 07/24/19 0351 07/25/19 0402  WBC 5.0   < > 21.3* 22.0* 17.1* 15.1* 12.6*  NEUTROABS 4.8  --   --   --   --   --   --   HGB 13.7   < > 10.3* 10.8* 10.9* 11.5* 11.4*  HCT 43.2   < > 30.4* 32.5* 34.4* 34.5* 35.6*  MCV 95.6   < > 93.0 92.9 94.2 92.7 93.4  PLT 209   < > 177 173 210 209 246   < > = values in this interval not displayed.   Basic Metabolic Panel: Recent Labs  Lab 07/20/19 1840 07/21/19 0230 07/21/19 1601 07/22/19 0235 07/23/19 0230 07/24/19 0351 07/25/19 0402  NA  --  137 141 141 143 140 139  K  --  3.4* 4.0 4.0 4.1 4.3 4.1  CL  --  107 110 113* 111 106 104  CO2  --  20* 24 22 26 26 28   GLUCOSE  --  188* 164* 187* 173* 167* 201*  BUN  --  30*  29* 28* 21 18 21   CREATININE  --  1.02 0.75 0.71 0.69 0.64 0.69  CALCIUM  --  7.4* 7.9* 7.8* 8.4* 9.3 8.9  MG 1.6* 1.8  --   --  2.0  --   --   PHOS 3.0 1.4*  --   --  2.5  --   --    GFR: Estimated Creatinine Clearance: 138.9 mL/min (by C-G formula based on SCr of 0.69 mg/dL). Liver Function Tests: Recent Labs  Lab 07/19/19 2110 07/21/19 0230 07/23/19 0230 07/24/19 0351  AST 98* 53* 22 21  ALT 69* 82* 48* 36  ALKPHOS 172* 93 105 101  BILITOT 1.1 0.8 0.2* 0.4  PROT 7.7 5.7* 6.1* 6.7  ALBUMIN 3.2* 2.0* 2.1* 2.2*   No results for input(s): LIPASE, AMYLASE in the last 168 hours. No results for  input(s): AMMONIA in the last 168 hours. Coagulation Profile: Recent Labs  Lab 07/19/19 2110  INR 1.5*   Cardiac Enzymes: No results for input(s): CKTOTAL, CKMB, CKMBINDEX, TROPONINI in the last 168 hours. BNP (last 3 results) No results for input(s): PROBNP in the last 8760 hours. HbA1C: No results for input(s): HGBA1C in the last 72 hours. CBG: Recent Labs  Lab 07/24/19 1504 07/24/19 2049 07/25/19 0119 07/25/19 0504 07/25/19 1208  GLUCAP 146* 164* 155* 190* 187*   Lipid Profile: No results for input(s): CHOL, HDL, LDLCALC, TRIG, CHOLHDL, LDLDIRECT in the last 72 hours. Thyroid Function Tests: No results for input(s): TSH, T4TOTAL, FREET4, T3FREE, THYROIDAB in the last 72 hours. Anemia Panel: No results for input(s): VITAMINB12, FOLATE, FERRITIN, TIBC, IRON, RETICCTPCT in the last 72 hours. Sepsis Labs: Recent Labs  Lab 07/19/19 0004 07/19/19 2110 07/20/19 0356 07/20/19 1545  LATICACIDVEN 4.6* 7.1* 4.8* 3.6*    Recent Results (from the past 240 hour(s))  Blood Culture (routine x 2)     Status: Abnormal   Collection Time: 07/19/19  8:46 PM   Specimen: BLOOD LEFT HAND  Result Value Ref Range Status   Specimen Description BLOOD LEFT HAND  Final   Special Requests   Final    BOTTLES DRAWN AEROBIC AND ANAEROBIC Blood Culture results may not be optimal due to an  inadequate volume of blood received in culture bottles   Culture  Setup Time   Final    IN BOTH AEROBIC AND ANAEROBIC BOTTLES GRAM NEGATIVE RODS CRITICAL RESULT CALLED TO, READ BACK BY AND VERIFIED WITH: Hughie Closs [facility] 07/20/19 2201 JDW Performed at [facility], [address], [place], [place] [address]    Culture KLEBSIELLA PNEUMONIAE (A)  Final   Report Status 07/23/2019 FINAL  Final   Organism ID, Bacteria KLEBSIELLA PNEUMONIAE  Final      Susceptibility   Klebsiella pneumoniae - MIC*    AMPICILLIN >=32 RESISTANT Resistant     CEFAZOLIN <=4 SENSITIVE Sensitive     CEFEPIME <=1 SENSITIVE Sensitive     CEFTAZIDIME <=1 SENSITIVE Sensitive     CEFTRIAXONE <=1 SENSITIVE Sensitive     CIPROFLOXACIN <=0.25 SENSITIVE Sensitive     GENTAMICIN <=1 SENSITIVE Sensitive     IMIPENEM <=0.25 SENSITIVE Sensitive     TRIMETH/SULFA <=20 SENSITIVE Sensitive     AMPICILLIN/SULBACTAM 4 SENSITIVE Sensitive     PIP/TAZO <=4 SENSITIVE Sensitive     * KLEBSIELLA PNEUMONIAE  Blood Culture ID Panel (Reflexed)     Status: Abnormal   Collection Time: 07/19/19  8:46 PM  Result Value Ref Range Status   Enterococcus species NOT DETECTED NOT DETECTED Final   Listeria monocytogenes NOT DETECTED NOT DETECTED Final   Staphylococcus species NOT DETECTED NOT DETECTED Final   Staphylococcus aureus (BCID) NOT DETECTED NOT DETECTED Final   Streptococcus species NOT DETECTED NOT DETECTED Final   Streptococcus agalactiae NOT DETECTED NOT DETECTED Final   Streptococcus pneumoniae NOT DETECTED NOT DETECTED Final   Streptococcus pyogenes NOT DETECTED NOT DETECTED Final   Acinetobacter baumannii NOT DETECTED NOT DETECTED Final   Enterobacteriaceae species DETECTED (A) NOT DETECTED Final    Comment: Enterobacteriaceae represent a large family of gram-negative bacteria, not a single organism. CRITICAL RESULT CALLED TO, READ BACK BY AND VERIFIED WITH: Hughie Closs [facility] 07/20/19 2201 JDW    Enterobacter cloacae complex  NOT DETECTED NOT DETECTED Final   Escherichia coli NOT DETECTED NOT DETECTED Final   Klebsiella oxytoca NOT DETECTED NOT DETECTED  Final   Klebsiella pneumoniae DETECTED (A) NOT DETECTED Final    Comment: CRITICAL RESULT CALLED TO, READ BACK BY AND VERIFIED WITH: Lytle Butte [facility] 07/20/19 2201 JDW    Proteus species NOT DETECTED NOT DETECTED Final   Serratia marcescens NOT DETECTED NOT DETECTED Final   Carbapenem resistance NOT DETECTED NOT DETECTED Final   Haemophilus influenzae NOT DETECTED NOT DETECTED Final   Neisseria meningitidis NOT DETECTED NOT DETECTED Final   Pseudomonas aeruginosa NOT DETECTED NOT DETECTED Final   Candida albicans NOT DETECTED NOT DETECTED Final   Candida glabrata NOT DETECTED NOT DETECTED Final   Candida krusei NOT DETECTED NOT DETECTED Final   Candida parapsilosis NOT DETECTED NOT DETECTED Final   Candida tropicalis NOT DETECTED NOT DETECTED Final    Comment: Performed at [facility], [address] [place], [place] [address]  Urine culture     Status: Abnormal   Collection Time: 07/19/19  9:10 PM   Specimen: In/Out Cath Urine  Result Value Ref Range Status   Specimen Description IN/OUT CATH URINE  Final   Special Requests   Final    NONE Performed at [facility], [address], [place], [place] [address]    Culture >=100,000 COLONIES/mL KLEBSIELLA PNEUMONIAE (A)  Final   Report Status 07/21/2019 FINAL  Final   Organism ID, Bacteria KLEBSIELLA PNEUMONIAE (A)  Final      Susceptibility   Klebsiella pneumoniae - MIC*    AMPICILLIN >=32 RESISTANT Resistant     CEFAZOLIN <=4 SENSITIVE Sensitive     CEFTRIAXONE <=1 SENSITIVE Sensitive     CIPROFLOXACIN <=0.25 SENSITIVE Sensitive     GENTAMICIN <=1 SENSITIVE Sensitive     IMIPENEM <=0.25 SENSITIVE Sensitive     NITROFURANTOIN 128 RESISTANT Resistant     TRIMETH/SULFA <=20 SENSITIVE Sensitive     AMPICILLIN/SULBACTAM 8 SENSITIVE Sensitive     PIP/TAZO <=4 SENSITIVE Sensitive     Extended  ESBL NEGATIVE Sensitive     * >=100,000 COLONIES/mL KLEBSIELLA PNEUMONIAE  SARS Coronavirus 2 [facility] order, Performed in [facility] hospital lab) Nasopharyngeal     Status: None   Collection Time: 07/19/19  9:10 PM   Specimen: Nasopharyngeal  Result Value Ref Range Status   SARS Coronavirus 2 NEGATIVE NEGATIVE Final    Comment: (NOTE) If result is NEGATIVE SARS-CoV-2 target nucleic acids are NOT DETECTED. The SARS-CoV-2 RNA is generally detectable in upper and lower  respiratory specimens during the acute phase of infection. The lowest  concentration of SARS-CoV-2 viral copies this assay can detect is 250  copies / mL. A negative result does not preclude SARS-CoV-2 infection  and should not be used as the sole basis for treatment or other  patient management decisions.  A negative result may occur with  improper specimen collection / handling, submission of specimen other  than nasopharyngeal swab, presence of viral mutation(s) within the  areas targeted by this assay, and inadequate number of viral copies  (<250 copies / mL). A negative result must be combined with clinical  observations, patient history, and epidemiological information. If result is POSITIVE SARS-CoV-2 target nucleic acids are DETECTED. The SARS-CoV-2 RNA is generally detectable in upper and lower  respiratory specimens dur ing the acute phase of infection.  Positive  results are indicative of active infection with SARS-CoV-2.  Clinical  correlation with patient history and other diagnostic information is  necessary to determine patient infection status.  Positive results do  not rule out bacterial infection  or co-infection with other viruses. If result is PRESUMPTIVE POSTIVE SARS-CoV-2 nucleic acids MAY BE PRESENT.   A presumptive positive result was obtained on the submitted specimen  and confirmed on repeat testing.  While 2019 novel coronavirus  (SARS-CoV-2) nucleic acids may be present in the submitted  sample  additional confirmatory testing may be necessary for epidemiological  and / or clinical management purposes  to differentiate between  SARS-CoV-2 and other Sarbecovirus currently known to infect humans.  If clinically indicated additional testing with an alternate test  methodology [phone]) is advised. The SARS-CoV-2 RNA is generally  detectable in upper and lower respiratory sp ecimens during the acute  phase of infection. The expected result is Negative. Fact Sheet for Patients:  BoilerBrush.com.cy Fact Sheet for Healthcare Providers: https://pope.com/ This test is not yet approved or cleared by the [place] FDA and has been authorized for detection and/or diagnosis of SARS-CoV-2 by FDA under an Emergency Use Authorization (EUA).  This EUA will remain in effect (meaning this test can be used) for the duration of the COVID-19 declaration under Section 564(b)(1) of the Act, 21 U.S.C. section 360bbb-3(b)(1), unless the authorization is terminated or revoked sooner. Performed at [facility], [address], [place], [place] [address]   Blood Culture (routine x 2)     Status: None   Collection Time: 07/19/19 10:20 PM   Specimen: BLOOD  Result Value Ref Range Status   Specimen Description BLOOD BLOOD LEFT FOREARM  Final   Special Requests   Final    BOTTLES DRAWN AEROBIC AND ANAEROBIC Blood Culture adequate volume   Culture   Final    NO GROWTH 5 DAYS Performed at [facility], [address], [place], [place] [address]    Report Status 07/25/2019 FINAL  Final  MRSA PCR Screening     Status: None   Collection Time: 07/20/19  3:58 AM  Result Value Ref Range Status   MRSA by PCR NEGATIVE NEGATIVE Final    Comment:        The GeneXpert MRSA Assay (FDA approved for NASAL specimens only), is one component of a comprehensive MRSA colonization surveillance program. It is not intended to diagnose  MRSA infection nor to guide or monitor treatment for MRSA infections. Performed at [facility], [address] [place], [place] [address]   Culture, respiratory (non-expectorated)     Status: None   Collection Time: 07/20/19  5:29 AM   Specimen: Tracheal Aspirate; Respiratory  Result Value Ref Range Status   Specimen Description TRACHEAL ASPIRATE  Final   Special Requests NONE  Final   Gram Stain   Final    ABUNDANT WBC PRESENT, PREDOMINANTLY PMN RARE GRAM POSITIVE COCCI Performed at [facility], [address], [place], [place] [address]    Culture RARE STAPHYLOCOCCUS AUREUS  Final   Report Status 07/22/2019 FINAL  Final   Organism ID, Bacteria STAPHYLOCOCCUS AUREUS  Final      Susceptibility   Staphylococcus aureus - MIC*    CIPROFLOXACIN <=0.5 SENSITIVE Sensitive     ERYTHROMYCIN <=0.25 SENSITIVE Sensitive     GENTAMICIN <=0.5 SENSITIVE Sensitive     OXACILLIN 0.5 SENSITIVE Sensitive     TETRACYCLINE <=1 SENSITIVE Sensitive     VANCOMYCIN 1 SENSITIVE Sensitive     TRIMETH/SULFA <=10 SENSITIVE Sensitive     CLINDAMYCIN <=0.25 SENSITIVE Sensitive     RIFAMPIN <=0.5 SENSITIVE Sensitive     Inducible Clindamycin NEGATIVE Sensitive     *  RARE STAPHYLOCOCCUS AUREUS  Respiratory Panel by PCR     Status: Abnormal   Collection Time: 07/20/19  8:31 AM   Specimen: Nasopharyngeal Swab; Respiratory  Result Value Ref Range Status   Adenovirus NOT DETECTED NOT DETECTED Final   Coronavirus 229E NOT DETECTED NOT DETECTED Final    Comment: (NOTE) The Coronavirus on the Respiratory Panel, DOES NOT test for the novel  Coronavirus (2019 nCoV)    Coronavirus HKU1 NOT DETECTED NOT DETECTED Final   Coronavirus NL63 NOT DETECTED NOT DETECTED Final   Coronavirus OC43 NOT DETECTED NOT DETECTED Final   Metapneumovirus NOT DETECTED NOT DETECTED Final   Rhinovirus / Enterovirus DETECTED (A) NOT DETECTED Final   Influenza A NOT DETECTED NOT DETECTED Final   Influenza B NOT  DETECTED NOT DETECTED Final   Parainfluenza Virus 1 NOT DETECTED NOT DETECTED Final   Parainfluenza Virus 2 NOT DETECTED NOT DETECTED Final   Parainfluenza Virus 3 NOT DETECTED NOT DETECTED Final   Parainfluenza Virus 4 NOT DETECTED NOT DETECTED Final   Respiratory Syncytial Virus NOT DETECTED NOT DETECTED Final   Bordetella pertussis NOT DETECTED NOT DETECTED Final   Chlamydophila pneumoniae NOT DETECTED NOT DETECTED Final   Mycoplasma pneumoniae NOT DETECTED NOT DETECTED Final    Comment: Performed at [facility], [address], [place], [place] [address]         Radiology Studies: No results found.      Scheduled Meds:  acetaminophen  650 mg Rectal Once   aspirin  81 mg Per Tube Daily   budesonide  0.25 mg Nebulization BID   cefdinir  300 mg Per Tube Q12H   chlorhexidine  15 mL Mouth Rinse BID   Chlorhexidine Gluconate Cloth  6 each Topical Daily   citalopram  10 mg Per Tube Daily   clopidogrel  75 mg Per Tube Daily   feeding supplement (PRO-STAT SUGAR FREE 64)  60 mL Oral TID   finasteride  5 mg Oral Daily   heparin  5,000 Units Subcutaneous Q8H   insulin aspart  0-15 Units Subcutaneous Q4H   lidocaine  1 patch Transdermal Q24H   mouth rinse  15 mL Mouth Rinse q12n4p   sodium chloride flush  10-40 mL Intracatheter Q12H   Continuous Infusions:  sodium chloride 10 mL/hr at 07/24/19 2000   feeding supplement (OSMOLITE 1.5 CAL) 1,000 mL (07/24/19 1623)     LOS: 5 days    Time spent: 35 minutes    Jae Dire, DO [facility] Pager [phone]  If 7PM-7AM, please contact night-coverage [email or web address] Password [email or web address] 07/25/2019, 1:34 PM

## 2019-07-25 NOTE — Consult Note (Signed)
Inpatient Rehab Admissions:  Inpatient Rehab Consult received.  I met with pt at the bedside for rehabilitation assessment. I am familiar with this pt as he was in [place] back in September (9/8-9/28). Discussed with PM&R MD, Dr. Dagoberto Ligas- this patient currently has medical necessity to admit to CIR. Pt is very interested in a return to CIR and would greatly benefit functionally as he is no where near where he was functionally since DC from CIR on 9/28.   Will follow for medical readiness and possible admit to CIR.   Michael Bright, OTR/L  Rehab Admissions Coordinator  [phone] 07/25/2019 12:06 PM

## 2019-07-25 NOTE — Progress Notes (Signed)
Inpatient Rehabilitation-Admissions Coordinator   Per Dr. Neysa Bonito, this patient is medically ready for CIR. AC will need an OT evaluation complete prior to initiating insurance authorization for CIR. I have contacted the acute therapy office to request OT evaluation today. Will initiate insurance authorization process once that has been complete.   Please call if questions.   Jhonnie Garner, OTR/L  Rehab Admissions Coordinator  [phone] 07/25/2019 1:21 PM

## 2019-07-25 NOTE — Progress Notes (Signed)
RT NOTE: RT gave pt breathing treatment and pt asked to go back on CPAP to sleep a little longer. RT helped patient put CPAP mask back on and pressure was set at 10. Patient says his breathing is comfortable and that the pressure was at a good place. RT will continue to monitor as needed.

## 2019-07-26 ENCOUNTER — Encounter (HOSPITAL_COMMUNITY): Payer: Self-pay

## 2019-07-26 ENCOUNTER — Inpatient Hospital Stay (HOSPITAL_COMMUNITY)
Admission: RE | Admit: 2019-07-26 | Discharge: 2019-08-08 | DRG: 945 | Disposition: A | Discharge status: Home Health | Payer: BC Managed Care – PPO | Source: Intra-hospital | Attending: Physical Medicine & Rehabilitation | Admitting: Physical Medicine & Rehabilitation

## 2019-07-26 DIAGNOSIS — G4733 Obstructive sleep apnea (adult) (pediatric): Secondary | ICD-10-CM | POA: Diagnosis present

## 2019-07-26 DIAGNOSIS — Z7951 Long term (current) use of inhaled steroids: Secondary | ICD-10-CM

## 2019-07-26 DIAGNOSIS — R339 Retention of urine, unspecified: Secondary | ICD-10-CM | POA: Diagnosis present

## 2019-07-26 DIAGNOSIS — R5381 Other malaise: Secondary | ICD-10-CM | POA: Diagnosis present

## 2019-07-26 DIAGNOSIS — R1312 Dysphagia, oropharyngeal phase: Secondary | ICD-10-CM

## 2019-07-26 DIAGNOSIS — Z79899 Other long term (current) drug therapy: Secondary | ICD-10-CM | POA: Diagnosis not present

## 2019-07-26 DIAGNOSIS — I639 Cerebral infarction, unspecified: Secondary | ICD-10-CM

## 2019-07-26 DIAGNOSIS — Z7982 Long term (current) use of aspirin: Secondary | ICD-10-CM | POA: Diagnosis not present

## 2019-07-26 DIAGNOSIS — R1310 Dysphagia, unspecified: Secondary | ICD-10-CM | POA: Diagnosis present

## 2019-07-26 DIAGNOSIS — D72829 Elevated white blood cell count, unspecified: Secondary | ICD-10-CM

## 2019-07-26 DIAGNOSIS — E1322 Other specified diabetes mellitus with diabetic chronic kidney disease: Secondary | ICD-10-CM | POA: Diagnosis not present

## 2019-07-26 DIAGNOSIS — I129 Hypertensive chronic kidney disease with stage 1 through stage 4 chronic kidney disease, or unspecified chronic kidney disease: Secondary | ICD-10-CM | POA: Diagnosis present

## 2019-07-26 DIAGNOSIS — R6889 Other general symptoms and signs: Secondary | ICD-10-CM

## 2019-07-26 DIAGNOSIS — I69391 Dysphagia following cerebral infarction: Secondary | ICD-10-CM

## 2019-07-26 DIAGNOSIS — B961 Klebsiella pneumoniae [K. pneumoniae] as the cause of diseases classified elsewhere: Secondary | ICD-10-CM | POA: Diagnosis present

## 2019-07-26 DIAGNOSIS — D72828 Other elevated white blood cell count: Secondary | ICD-10-CM | POA: Diagnosis present

## 2019-07-26 DIAGNOSIS — N390 Urinary tract infection, site not specified: Secondary | ICD-10-CM | POA: Diagnosis present

## 2019-07-26 DIAGNOSIS — N319 Neuromuscular dysfunction of bladder, unspecified: Secondary | ICD-10-CM | POA: Diagnosis present

## 2019-07-26 DIAGNOSIS — Z6841 Body Mass Index (BMI) 40.0 and over, adult: Secondary | ICD-10-CM

## 2019-07-26 DIAGNOSIS — R0989 Other specified symptoms and signs involving the circulatory and respiratory systems: Secondary | ICD-10-CM

## 2019-07-26 DIAGNOSIS — Z09 Encounter for follow-up examination after completed treatment for conditions other than malignant neoplasm: Secondary | ICD-10-CM

## 2019-07-26 DIAGNOSIS — D638 Anemia in other chronic diseases classified elsewhere: Secondary | ICD-10-CM | POA: Diagnosis present

## 2019-07-26 DIAGNOSIS — E1165 Type 2 diabetes mellitus with hyperglycemia: Secondary | ICD-10-CM | POA: Diagnosis present

## 2019-07-26 DIAGNOSIS — Z794 Long term (current) use of insulin: Secondary | ICD-10-CM | POA: Diagnosis not present

## 2019-07-26 DIAGNOSIS — Z8673 Personal history of transient ischemic attack (TIA), and cerebral infarction without residual deficits: Secondary | ICD-10-CM | POA: Diagnosis present

## 2019-07-26 DIAGNOSIS — M75102 Unspecified rotator cuff tear or rupture of left shoulder, not specified as traumatic: Secondary | ICD-10-CM | POA: Diagnosis not present

## 2019-07-26 DIAGNOSIS — R5382 Chronic fatigue, unspecified: Secondary | ICD-10-CM | POA: Diagnosis present

## 2019-07-26 DIAGNOSIS — Z7902 Long term (current) use of antithrombotics/antiplatelets: Secondary | ICD-10-CM

## 2019-07-26 DIAGNOSIS — E1122 Type 2 diabetes mellitus with diabetic chronic kidney disease: Secondary | ICD-10-CM | POA: Diagnosis present

## 2019-07-26 DIAGNOSIS — I69998 Other sequelae following unspecified cerebrovascular disease: Secondary | ICD-10-CM

## 2019-07-26 DIAGNOSIS — Z931 Gastrostomy status: Secondary | ICD-10-CM | POA: Diagnosis not present

## 2019-07-26 DIAGNOSIS — R7309 Other abnormal glucose: Secondary | ICD-10-CM

## 2019-07-26 DIAGNOSIS — N183 Chronic kidney disease, stage 3 unspecified: Secondary | ICD-10-CM | POA: Diagnosis present

## 2019-07-26 HISTORY — DX: Sepsis, unspecified organism: A41.9

## 2019-07-26 HISTORY — DX: Cerebral infarction, unspecified: I63.9

## 2019-07-26 HISTORY — DX: Sepsis, unspecified organism: R65.21

## 2019-07-26 HISTORY — DX: Pneumonia, unspecified organism: J18.9

## 2019-07-26 HISTORY — DX: Urinary tract infection, site not specified: N39.0

## 2019-07-26 HISTORY — DX: Acute kidney failure, unspecified: N17.9

## 2019-07-26 LAB — GLUCOSE, CAPILLARY
Glucose-Capillary: 208 mg/dL — ABNORMAL HIGH (ref 70–99)
Glucose-Capillary: 171 mg/dL — ABNORMAL HIGH (ref 70–99)
Glucose-Capillary: 138 mg/dL — ABNORMAL HIGH (ref 70–99)
Glucose-Capillary: 157 mg/dL — ABNORMAL HIGH (ref 70–99)
Glucose-Capillary: 128 mg/dL — ABNORMAL HIGH (ref 70–99)

## 2019-07-26 LAB — COMPREHENSIVE METABOLIC PANEL
ALT: 35 U/L (ref 0–44)
AST: 23 U/L (ref 15–41)
Albumin: 2.3 g/dL — ABNORMAL LOW (ref 3.5–5.0)
Alkaline Phosphatase: 83 U/L (ref 38–126)
Anion gap: 8 (ref 5–15)
BUN: 21 mg/dL (ref 8–23)
CO2: 26 mmol/L (ref 22–32)
Calcium: 8.8 mg/dL — ABNORMAL LOW (ref 8.9–10.3)
Chloride: 103 mmol/L (ref 98–111)
Creatinine, Ser: 0.73 mg/dL (ref 0.61–1.24)
GFR calc Af Amer: >60 mL/min (ref >60)
GFR calc non Af Amer: >60 mL/min (ref >60)
Glucose, Bld: 214 mg/dL — ABNORMAL HIGH (ref 70–99)
Potassium: 4.1 mmol/L (ref 3.5–5.1)
Sodium: 137 mmol/L (ref 135–145)
Total Bilirubin: 0.4 mg/dL (ref 0.3–1.2)
Total Protein: 6.3 g/dL — ABNORMAL LOW (ref 6.5–8.1)

## 2019-07-26 LAB — CBC
HCT: 35.3 % — ABNORMAL LOW (ref 39.0–52.0)
Hemoglobin: 11.5 g/dL — ABNORMAL LOW (ref 13.0–17.0)
MCH: 30.4 pg (ref 26.0–34.0)
MCHC: 32.6 g/dL (ref 30.0–36.0)
MCV: 93.4 fL (ref 80.0–100.0)
Platelets: 235 10*3/uL (ref 150–400)
RBC: 3.78 MIL/uL — ABNORMAL LOW (ref 4.22–5.81)
RDW: 12.5 % (ref 11.5–15.5)
WBC: 10.6 10*3/uL — ABNORMAL HIGH (ref 4.0–10.5)
nRBC: 0.2 % (ref 0.0–0.2)

## 2019-07-26 MED ORDER — HEPARIN SODIUM (PORCINE) 5000 UNIT/ML IJ SOLN
5000.0000 [IU] | Freq: Three times a day (TID) | INTRAMUSCULAR | Status: DC
  Administered 2019-07-26 – 2019-08-08 (×38): 5000 [IU] via SUBCUTANEOUS
  Filled 2019-07-26 (×38): qty 1

## 2019-07-26 MED ORDER — CEFDINIR 300 MG PO CAPS: 300.0000 mg | ORAL_CAPSULE | Freq: Two times a day (BID) | ORAL | 0 refills | Status: DC

## 2019-07-26 MED ORDER — PROCHLORPERAZINE MALEATE 5 MG PO TABS: 5.0000 mg | ORAL_TABLET | Freq: Four times a day (QID) | ORAL | Status: DC | PRN

## 2019-07-26 MED ORDER — FINASTERIDE 5 MG PO TABS
5.0000 mg | ORAL_TABLET | Freq: Every day | ORAL | Status: DC
  Administered 2019-07-27 – 2019-08-08 (×13): 5 mg via ORAL
  Filled 2019-07-26 (×13): qty 1

## 2019-07-26 MED ORDER — ACETAMINOPHEN 325 MG PO TABS
325.0000 mg | ORAL_TABLET | ORAL | Status: DC | PRN
  Administered 2019-07-26 – 2019-08-08 (×21): 650 mg
  Filled 2019-07-26 (×20): qty 2

## 2019-07-26 MED ORDER — LIDOCAINE HCL URETHRAL/MUCOSAL 2 % EX GEL: CUTANEOUS | Status: DC | PRN

## 2019-07-26 MED ORDER — PROCHLORPERAZINE 25 MG RE SUPP: 12.5000 mg | Freq: Four times a day (QID) | RECTAL | Status: DC | PRN

## 2019-07-26 MED ORDER — DIPHENHYDRAMINE HCL 12.5 MG/5ML PO ELIX: 12.5000 mg | ORAL_SOLUTION | Freq: Four times a day (QID) | ORAL | Status: DC | PRN

## 2019-07-26 MED ORDER — FLEET ENEMA 7-19 GM/118ML RE ENEM: 1.0000 | ENEMA | Freq: Once | RECTAL | Status: DC | PRN

## 2019-07-26 MED ORDER — CHLORHEXIDINE GLUCONATE 0.12 % MT SOLN
15.0000 mL | Freq: Two times a day (BID) | OROMUCOSAL | Status: DC
  Administered 2019-07-26 – 2019-08-08 (×23): 15 mL via OROMUCOSAL
  Filled 2019-07-26 (×24): qty 15

## 2019-07-26 MED ORDER — TRAZODONE HCL 50 MG PO TABS
25.0000 mg | ORAL_TABLET | Freq: Every evening | ORAL | Status: DC | PRN
  Administered 2019-07-30: 50 mg
  Filled 2019-07-26: qty 1

## 2019-07-26 MED ORDER — CLOPIDOGREL BISULFATE 75 MG PO TABS
75.0000 mg | ORAL_TABLET | Freq: Every day | ORAL | Status: DC
  Administered 2019-07-27 – 2019-08-08 (×13): 75 mg
  Filled 2019-07-26 (×13): qty 1

## 2019-07-26 MED ORDER — GUAIFENESIN ER 600 MG PO TB12
600.0000 mg | ORAL_TABLET | Freq: Two times a day (BID) | ORAL | Status: DC
  Administered 2019-07-26: 600 mg via ORAL
  Filled 2019-07-26: qty 1

## 2019-07-26 MED ORDER — SENNOSIDES 8.8 MG/5ML PO SYRP
5.0000 mL | ORAL_SOLUTION | Freq: Two times a day (BID) | ORAL | Status: DC | PRN
  Administered 2019-07-29 – 2019-08-05 (×3): 5 mL
  Filled 2019-07-26 (×4): qty 5

## 2019-07-26 MED ORDER — PROCHLORPERAZINE EDISYLATE 10 MG/2ML IJ SOLN: 5.0000 mg | Freq: Four times a day (QID) | INTRAMUSCULAR | Status: DC | PRN

## 2019-07-26 MED ORDER — GUAIFENESIN-DM 100-10 MG/5ML PO SYRP: 5.0000 mL | ORAL_SOLUTION | ORAL | Status: DC | PRN

## 2019-07-26 MED ORDER — ASPIRIN 81 MG PO CHEW
81.0000 mg | CHEWABLE_TABLET | Freq: Every day | ORAL | Status: DC
  Administered 2019-07-27 – 2019-08-08 (×13): 81 mg
  Filled 2019-07-26 (×13): qty 1

## 2019-07-26 MED ORDER — INSULIN ASPART 100 UNIT/ML ~~LOC~~ SOLN
0.0000 [IU] | SUBCUTANEOUS | Status: DC
  Administered 2019-07-26 – 2019-07-27 (×2): 2 [IU] via SUBCUTANEOUS
  Administered 2019-07-27 (×3): 3 [IU] via SUBCUTANEOUS
  Administered 2019-07-27: 2 [IU] via SUBCUTANEOUS
  Administered 2019-07-28 (×3): 3 [IU] via SUBCUTANEOUS
  Administered 2019-07-28 – 2019-07-29 (×2): 5 [IU] via SUBCUTANEOUS
  Administered 2019-07-29: 3 [IU] via SUBCUTANEOUS
  Administered 2019-07-29 (×2): 2 [IU] via SUBCUTANEOUS
  Administered 2019-07-29: 5 [IU] via SUBCUTANEOUS
  Administered 2019-07-29 – 2019-07-30 (×2): 3 [IU] via SUBCUTANEOUS
  Administered 2019-07-30: 5 [IU] via SUBCUTANEOUS
  Administered 2019-07-30: 2 [IU] via SUBCUTANEOUS
  Administered 2019-07-30 (×2): 3 [IU] via SUBCUTANEOUS
  Administered 2019-07-30: 5 [IU] via SUBCUTANEOUS
  Administered 2019-07-31 (×3): 3 [IU] via SUBCUTANEOUS

## 2019-07-26 MED ORDER — GLYCOPYRROLATE 1 MG PO TABS
1.0000 mg | ORAL_TABLET | Freq: Three times a day (TID) | ORAL | Status: DC
  Administered 2019-07-26 – 2019-07-27 (×4): 1 mg
  Filled 2019-07-26 (×6): qty 1

## 2019-07-26 MED ORDER — CEFDINIR 300 MG PO CAPS
300.0000 mg | ORAL_CAPSULE | Freq: Two times a day (BID) | ORAL | Status: AC
  Administered 2019-07-26 – 2019-08-01 (×13): 300 mg
  Filled 2019-07-26 (×13): qty 1

## 2019-07-26 MED ORDER — ALUM & MAG HYDROXIDE-SIMETH 200-200-20 MG/5ML PO SUSP: 30.0000 mL | ORAL | Status: DC | PRN

## 2019-07-26 MED ORDER — PRO-STAT SUGAR FREE PO LIQD
60.0000 mL | Freq: Three times a day (TID) | ORAL | Status: DC
  Administered 2019-07-26 – 2019-07-27 (×2): 60 mL via ORAL
  Filled 2019-07-26: qty 60

## 2019-07-26 MED ORDER — ORAL CARE MOUTH RINSE
15.0000 mL | Freq: Two times a day (BID) | OROMUCOSAL | Status: DC
  Administered 2019-07-27 – 2019-08-07 (×22): 15 mL via OROMUCOSAL

## 2019-07-26 MED ORDER — FREE WATER
200.0000 mL | Freq: Three times a day (TID) | Status: DC
  Administered 2019-07-26 – 2019-08-07 (×34): 200 mL

## 2019-07-26 MED ORDER — CITALOPRAM HYDROBROMIDE 10 MG PO TABS
10.0000 mg | ORAL_TABLET | Freq: Every day | ORAL | Status: DC
  Administered 2019-07-27 – 2019-08-08 (×13): 10 mg
  Filled 2019-07-26 (×13): qty 1

## 2019-07-26 MED ORDER — GUAIFENESIN ER 600 MG PO TB12: 600.0000 mg | ORAL_TABLET | Freq: Two times a day (BID) | ORAL | 0 refills | Status: DC

## 2019-07-26 MED ORDER — GUAIFENESIN 100 MG/5ML PO SOLN
10.0000 mL | Freq: Three times a day (TID) | ORAL | Status: DC
  Administered 2019-07-26 – 2019-07-30 (×16): 200 mg via ORAL
  Filled 2019-07-26 (×6): qty 10
  Filled 2019-07-26: qty 5
  Filled 2019-07-26: qty 10
  Filled 2019-07-26: qty 25
  Filled 2019-07-26: qty 10
  Filled 2019-07-26 (×2): qty 25
  Filled 2019-07-26: qty 5
  Filled 2019-07-26: qty 10
  Filled 2019-07-26: qty 25
  Filled 2019-07-26: qty 50
  Filled 2019-07-26: qty 5
  Filled 2019-07-26: qty 10

## 2019-07-26 MED ORDER — BISACODYL 10 MG RE SUPP: 10.0000 mg | Freq: Every day | RECTAL | Status: DC | PRN

## 2019-07-26 MED ORDER — LIDOCAINE 5 % EX PTCH
1.0000 | MEDICATED_PATCH | CUTANEOUS | Status: DC
  Administered 2019-07-26 – 2019-08-07 (×13): 1 via TRANSDERMAL
  Filled 2019-07-26 (×13): qty 1

## 2019-07-26 MED ORDER — IPRATROPIUM-ALBUTEROL 0.5-2.5 (3) MG/3ML IN SOLN
3.0000 mL | Freq: Four times a day (QID) | RESPIRATORY_TRACT | Status: DC
  Administered 2019-07-26: 3 mL via RESPIRATORY_TRACT
  Filled 2019-07-26: qty 3

## 2019-07-26 MED ORDER — CHLORHEXIDINE GLUCONATE CLOTH 2 % EX PADS
6.0000 | MEDICATED_PAD | Freq: Every day | CUTANEOUS | Status: DC
  Administered 2019-07-31 – 2019-08-07 (×7): 6 via TOPICAL

## 2019-07-26 MED ORDER — IPRATROPIUM-ALBUTEROL 0.5-2.5 (3) MG/3ML IN SOLN: 3.0000 mL | RESPIRATORY_TRACT | Status: DC | PRN

## 2019-07-26 MED ORDER — LIDOCAINE 5 % EX PTCH: 1.0000 | MEDICATED_PATCH | CUTANEOUS | 0 refills | Status: DC

## 2019-07-26 MED ORDER — OSMOLITE 1.5 CAL PO LIQD
1000.0000 mL | ORAL | Status: DC
  Administered 2019-07-26: 22:00:00 1000 mL
  Filled 2019-07-26 (×2): qty 1000

## 2019-07-26 MED ORDER — GUAIFENESIN-DM 100-10 MG/5ML PO SYRP
5.0000 mL | ORAL_SOLUTION | ORAL | Status: DC | PRN
  Administered 2019-07-26: 5 mL via ORAL
  Filled 2019-07-26: qty 5

## 2019-07-26 MED ORDER — IPRATROPIUM-ALBUTEROL 0.5-2.5 (3) MG/3ML IN SOLN: 3.0000 mL | Freq: Four times a day (QID) | RESPIRATORY_TRACT | 0 refills | Status: DC

## 2019-07-26 MED ORDER — BUDESONIDE 0.25 MG/2ML IN SUSP
0.2500 mg | Freq: Two times a day (BID) | RESPIRATORY_TRACT | Status: DC
  Administered 2019-07-26 – 2019-08-08 (×23): 0.25 mg via RESPIRATORY_TRACT
  Filled 2019-07-26 (×27): qty 2

## 2019-07-26 MED ORDER — IPRATROPIUM-ALBUTEROL 0.5-2.5 (3) MG/3ML IN SOLN
3.0000 mL | Freq: Four times a day (QID) | RESPIRATORY_TRACT | Status: DC
  Administered 2019-07-26: 21:00:00 3 mL via RESPIRATORY_TRACT
  Filled 2019-07-26: qty 9
  Filled 2019-07-26: qty 3

## 2019-07-26 NOTE — H&P (Addendum)
Physical Medicine and Rehabilitation Admission H&P        Chief Complaint   Patient presents with   . 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 He tolerated decannulation by 09/22 and oral secretions were being managed with use of robinul and guaifenesin DM. He was discharged to home NPO with CGA for mobility due to dizziness and ataxia. He was readmitted on 07/20/19 with dyspnea with increase WOB requiring intubation and was found to have septic shock with hypotension and AKI. He was treated with broad spectrum antibiotics with IVF and found to have Klebsiella pneumoniae bacteremia with UTI and bilateral lung infiltrates due to rhino virus. He tolerated extubation by 10/2 and cefazolin narrowed to cefdinir on 10/05. Intermittent hypoxic episodes resolving and acute respiratory failure felt to bed in secondary to rhino virus in setting of sever dysphagia. Abnormal LFTs due to shocked liver as well as AKI have resolved. Therapy evaluation done revealing debility with dysphonia and increased secretions. CIR recommended due to functional deficits.   Pt is requiring a lot of oral suctioning due to excessive secretions, however feels like can keep up with in rehab, and doesn't want to wait until tomorrow to come to rehab.   Review of Systems  Constitutional: Negative for chills and fever.  HENT: Negative for hearing loss and tinnitus.  Eyes: Negative for blurred vision and double vision.  Respiratory: Positive for cough and sputum production. Negative for shortness of breath.  Cardiovascular: Positive for leg  swelling. Negative for chest pain and palpitations.  Gastrointestinal: Negative for heartburn and nausea.  Genitourinary: Negative for dysuria and frequency.  Musculoskeletal: Positive for joint pain (left shoulder pain) and neck pain.  Neurological: Positive for weakness. Negative for dizziness and headaches.  Psychiatric/Behavioral: The patient does not have insomnia.            Past Medical History:    Diagnosis Date    . Diabetes [facility])            Past Surgical History:    Procedure Laterality Date    . CHOLECYSTECTOMY      . IR GASTROSTOMY TUBE MOD SED  07/04/2019    Family History: Unknown--adopted.  Social History: Married. Independent and working prior to stroke. He reports that he has never smoked. He has never used smokeless tobacco. He reports current alcohol use. He reports that he does not use drugs.  Allergies: No Known Allergies             Medications Prior to Admission    Medication Sig Dispense Refill    . acetaminophen (TYLENOL) 325 MG tablet Place 1-2 tablets (325-650 mg total) into feeding tube every 4 (four) hours as needed for mild pain.      . Amino Acids-Protein Hydrolys (FEEDING SUPPLEMENT, PRO-STAT SUGAR FREE 64,) LIQD Place 30 mLs into feeding tube 2 (two) times daily. 887 mL 0    . amLODipine (NORVASC) 10 MG tablet Place 1 tablet (10 mg total) into feeding tube daily. 30 tablet 1    . aspirin 81 MG chewable tablet Place 1 tablet (81 mg total) into feeding tube daily.      Marland Kitchen  atorvastatin (LIPITOR) 40 MG tablet Place 1 tablet (40 mg total) into feeding tube daily at 6 PM. 30 tablet 0    . bethanechol (URECHOLINE) 25 MG tablet Place 1 tablet (25 mg total) into feeding tube 4 (four) times daily. 120 tablet 0    . budesonide (PULMICORT) 0.25 MG/2ML nebulizer solution Take 2 mLs (0.25 mg total) by nebulization 2 (two) times daily. 60 mL 1    . citalopram (CELEXA) 10 MG tablet Place 1 tablet (10 mg total) into feeding tube daily. 30 tablet 0    . clopidogrel (PLAVIX)  75 MG tablet Place 1 tablet (75 mg total) into feeding tube daily. 30 tablet 0    . famotidine (PEPCID) 40 MG/5ML suspension Place 2.5 mLs (20 mg total) into feeding tube 2 (two) times daily. 200 mL 0    . glycopyrrolate (ROBINUL) 1 MG tablet Place 2 tablets (2 mg total) into feeding tube 2 (two) times daily. 120 tablet 0    . guaiFENesin-dextromethorphan (ROBITUSSIN DM) 100-10 MG/5ML syrup Place 15 mLs into feeding tube every 6 (six) hours. 473 mL 0    . Insulin Glargine (BASAGLAR KWIKPEN [place]) Inject 33 Units into the skin daily.      Marland Kitchen mouth rinse LIQD solution 15 mLs by Mouth Rinse route 5 (five) times daily. 946 mL 1    . Nutritional Supplements (FEEDING SUPPLEMENT, OSMOLITE 1.5 CAL,) LIQD Place 474 mLs into feeding tube 3 (three) times daily with meals.  0    . QUEtiapine (SEROQUEL) 100 MG tablet Place 1 tablet (100 mg total) into feeding tube at bedtime. 30 tablet 0    . tamsulosin (FLOMAX) 0.4 MG CAPS capsule Open capsule and administer medication via PEG tube 30 capsule 0    . Water For Irrigation, Sterile (FREE WATER) SOLN Place 200 mLs into feeding tube 4 (four) times daily - with meals and at bedtime. Use filtered or bottled water (not distilled water)      . blood glucose meter kit and supplies KIT Dispense based on patient and insurance preference. Use up to four times daily as directed. (FOR ICD-9 250.00, 250.01). 1 each 0    . finasteride (PROSCAR) 5 MG tablet Put in tube (Patient not taking: Reported on 07/20/2019) 30 tablet 0    . Insulin Glargine (LANTUS) 100 UNIT/ML Solostar Pen Inject 33 Units into the skin daily. (Patient not taking: Reported on 07/20/2019) 15 mL 11    . Insulin Pen Needle (PEN NEEDLES) 32G X 6 MM MISC 1 application by Does not apply route daily. 100 each 0    Drug Regimen Review   Drug regimen was reviewed and remains appropriate with no significant issues identified  Home:  Home Living  Family/patient expects to be discharged to:: Private residence  Living  Arrangements: Spouse/significant other  Available Help at Discharge: Family, Available 24 hours/day  Type of Home: House  Home Access: Ramped entrance  Entrance Stairs-Number of Steps: 4 stairs  Home Layout: Two level, Able to live on main level with bedroom/bathroom  Alternate Level Stairs-Number of Steps: basement, but does not go in  [employer] Shower/Tub: Tub/shower unit  [employer]: Handicapped height  Home Equipment: Environmental consultant - 2 wheels, Tub bench  Lives With: Spouse  Functional History:  Prior Function  Level of Independence: Independent with assistive device(s)  Comments: was home for two days walking with RW on his own, assist for socks, shoes, showering with HHshower and tub bench  Functional Status:  Mobility:  Bed Mobility  Overal bed mobility: Needs Assistance  Bed Mobility: Sit to Supine  Supine to sit: HOB elevated, Min assist  Sit to supine: Max assist, +2 for physical assistance  General bed mobility comments: requires min assist for UE/trunk, max assist for LB when returning to supine  Transfers  Overall transfer level: Needs assistance  Equipment used: Rolling walker (2 wheeled)  Transfer via Lift Equipment: Stedy  Transfers: Sit to/from Stand, [employer]  Sit to Stand: Mod assist, Max assist  Stand pivot transfers: Total assist, +2 physical assistance  General transfer comment: patient stood from [facility] with RW, mod-max assist but unable to sustain stand during toileting hygiene; utilized stedy to achieve full stand with mod assist; utilized steady to pivot to EOB  ADL:  ADL  Overall ADL's : Needs assistance/impaired  Grooming: Minimal assistance, Sitting  Upper Body Bathing: Minimal assistance, Sitting  Lower Body Bathing: Maximal assistance, +2 for physical assistance, Sit to/from stand  Upper Body Dressing : Moderate assistance, Sitting  Lower Body Dressing: Total assistance, +2 for physical assistance, Sit to/from stand  Toilet Transfer:  Moderate assistance, Maximal assistance, +2 for physical assistance, Stand-pivot, BSC  Toilet Transfer Details (indicate cue type and reason): using stedy  Toileting- Clothing Manipulation and Hygiene: Total assistance, +2 for physical assistance, Sit to/from stand  Functional mobility during ADLs: Moderate assistance, Maximal assistance, +2 for physical assistance  General ADL Comments: pt limited by generalized weakness, decreased activity tolerance, L shoulder pain and decreased functional use of UE, impaired balance  Cognition:  Cognition  Overall Cognitive Status: Within Functional Limits for tasks assessed  Orientation Level: Oriented X4  Cognition  Arousal/Alertness: Awake/alert  Behavior During Therapy: WFL for tasks assessed/performed  Overall Cognitive Status: Within Functional Limits for tasks assessed  Area of Impairment: Orientation, Following commands, Attention  Orientation Level: Disoriented to, Situation, Time  Current Attention Level: Sustained  Following Commands: Follows one step commands consistently, Follows one step commands with increased time  General Comments: appears WFL for tasks assessed, further assessment warrented  Physical Exam:  Blood pressure 114/61, pulse 79, temperature (!) 97.4 F (36.3 C), temperature source Oral, resp. rate 18, height 6' (1.829 m), weight (!) 138.7 kg, SpO2 96 %.  Physical Exam  Nursing note and vitals reviewed.  Constitutional: He is oriented to person, place, and time. He appears well-developed and well-nourished.  HENT:  Head: Normocephalic and atraumatic.  CV: RRR Respiratory: pt has fair air movement; he is very gurgly and has a few wheezes- even after oral suctioning GI: He exhibits no distension. (+)BS, soft There is no abdominal tenderness. (+)PEG Musculoskeletal:  LUE 4/5; RUE 5-/5 LLE 4/5 RLE 5-/5  muscles tested bicep/tricep/WE/grip/finger abd HF/KE/KF/Df/PF/EHL General: Edema present.  Comments: 1+ Pedal edema  bilaterally and right hand  Neurological: He is alert and oriented to person, place, and time.  Dysphonia with copious oral secretions and bouts of coughing. Able to suction himself frequently.       Lab Results Last 48 Hours              Results for orders placed or performed during the hospital encounter of 07/19/19 (from the past 48 hour(s))     Glucose, capillary Status: Abnormal      Collection Time: 07/24/19 3:04 PM         1.  Debility secondary to septic shock from UTI and Klebsiella pneumonia.bacteremia with rhinovirus. 2.  Antithrombotics: -DVT/anticoagulation:  Pharmaceutical: Heparin             -  antiplatelet therapy: ASA/Plavix.  3. Pain Management: tylenol prn.  4. Mood: LCSW to follow for evaluation and support.              -antipsychotic agents: N/A 5. Neuropsych: This patient is capable of making decisions on his own behalf. 6. Skin/Wound Care: Routine pressure relief measures.  7. Fluids/Electrolytes/Nutrition:  NPO. Tube feeds qid with water flushes.  8. Klebsiella PNA bacteremia/UTI: On Cefdinir IV til 08/02/2019 9. Bilateral air space disease/Rhino virus: On Pulmicort bid, guifenesin qid (Mucinex cannot be crushed), duo nebs qid  10 Medullary stroke: Hgb A1c- 10.6. NPO due to severe dysphagia with oral secretions--will resume Robinul 1 mg tid (was on 2 mg tid at discharge). Continue NPO with tube feeds on osmolyte 45 cc/hr--? Adequate nutrition. Will increase to 55 cc/hr and consult RD to resume bolus tube feeds. Add water flushes - of note urine at bedside looks dark- needs more water 11. T2DM: Monitor BS every 4 hours with  SSI and  May need to resume lantus 12 HTN: Monitor BP tid-- currently controlled off medications.  13. OSA: Continue CPAP whenever asleep.  14. Anemia of chronic illness: Recheck CBC in am.  15. Reactive leucocytosis: Resolving--monitor for fevers. Recheck CBC in am 16. Urinary retention: Was requiring 1/O caths every 4-5 hours. Reports he  was urinarting so they were not cathing at home. Discussed that he had high volumes with retention during his admission.  Will monitor voiding with PVR checks--I/O cath for volumes > 350 cc. Off urecholine at this time. Continue proscar. Cannot use flomax because cannot be crushed.

## 2019-07-26 NOTE — Progress Notes (Signed)
Courtney Heys, MD  Physician  Physical Medicine and Rehabilitation  PMR Pre-admission  Signed  Date of Service:  07/25/2019 1:40 PM      Related encounter: ED to Hosp-Admission (Current) from 07/19/2019 in [place] [facility]      Signed         PMR Admission Coordinator Pre-Admission Assessment  Patient: Michael Bright is an 63 y.o., male MRN: [id number] DOB: 1955-12-28 Height: 6' (182.9 cm) Weight: (!) 138.7 kg  Insurance Information HMO:     PPO: yes     PCP:      IPA:      80/20:      OTHER:  PRIMARY: [employer] [place] with [employer]      Policy#: [id number]      Subscriber: Patient's wife Mliss Sax) [place] Name: Kenney Houseman      Phone#: [phone]     Fax#: [phone] Pre-Cert#: [id number]      Employer:  [employer] provided by Kenney Houseman for admit to CIR for 7 days (10/7-10/13). Follow up CM has yet to be assigned. Fax clinical updates to (f): [phone] and it will be assigned to an RN Benefits:  Phone #: [phone] and transferred to [phone]     Name:  Eff. Date: 10/19/2018 - still active     Deduct: $200 ($200 met)      Out of Pocket Max: $500 ($500 met)      Life Max: NA CIR: 80% coverage, 20% co-insurnace; limited by medical necessity via prior auth      SNF: 80% coverage, 20% co-insurance, limited by medical necessity via prior auth Outpatient: 80% coverage, 20% co-insurance; limited by medical necessity via prior [place]: 80% coverage; 20% co-insurance; limited by medical necessity via prior auth     DME: 80% coverage; 20% co-insurance; limited by medical necessity via prior auth   Providers:  SECONDARY: Medicare part A and B      Policy#: [id number]      Subscriber: Patient CM Name:       Phone#:      Fax#:  Pre-Cert#:       Employer:  Benefits:  Phone #: verified eligibility via [place] on 07/26/2019     Name: [employer] Eff. Date: Part A and B effective 10/19/2001     Deduct:       Out of Pocket Max:       Life Max:  CIR:       SNF:    Outpatient:      Co-Pay:  Home Health:       Co-Pay:  DME:      Co-Pay:   Medicaid Application Date:       Case Manager:  Disability Application Date:       Case Worker:   The Data Collection Information Summary for patients in Inpatient Rehabilitation Facilities with attached Privacy Act [employer] Records was provided and verbally reviewed with: Patient  Emergency Contact Information         Contact Information    Name Relation Home Work Mobile   [name],[name]    [phone]      Current Medical History  Patient Admitting Diagnosis: Debility due to multiple acute medical conditions   History of Present Illness: Michael Bright is a 63 year old male with history of T2DM, OSA, morbid obesity, recent admission for left medullary stroke 92/42/68 complicated by VDRF due to aspiration and MSSA pneumonia requiring tracheostomy as well as prolonged course antibiotics, severe dysphagia--PEG  placed on 9/15, urinary retention requiring I/O cathsand underwent comprehensive rehab 09/08-09/28/20 due to functional decline. He tolerated decannulation by 09/22 and oral secretions were being managed with use of robinul and guaifenesin DM. He was discharged to home NPO with CGA for mobility due to dizziness and ataxia. He was readmitted on 07/20/19 with dyspnea with increase WOB requiring intubation and was found to have septic shock with hypotension and AKI. He was treated with broad spectrum antibiotics with IVF and found to have Klebsiella pneumoniae bacteremia with UTI and bilateral lung infiltrates due to rhino virus. He tolerated extubation by 10/2 and cefazolin narrowed to cefdinir on 10/05. Intermittent hypoxic episodes resolving and acute respiratory failure felt to bed in secondary to rhino virus in setting of sever dysphagia. Abnormal LFTs due to shocked liver as well as AKI have resolved. Therapy evaluation done revealing debility and CIR recommended for follow up  therapy. Pt is to be admitted to CIR on 07/26/2019.    Patient's medical record from [facility] has been reviewed by the rehabilitation admission coordinator and physician.  Past Medical History      Past Medical History:  Diagnosis Date   Diabetes [facility])     Family History   family history is not on file.  Prior Rehab/Hospitalizations Has the patient had prior rehab or hospitalizations prior to admission? Yes  Has the patient had major surgery during 100 days prior to admission? Yes             Current Medications  Current Facility-Administered Medications:    0.9 %  sodium chloride infusion, , Intravenous, PRN, Parrett, Tammy S, NP, Last Rate: 10 mL/hr at 07/24/19 2000   acetaminophen (TYLENOL) suppository 650 mg, 650 mg, Rectal, Once, Parrett, Tammy S, NP   acetaminophen (TYLENOL) tablet 650 mg, 650 mg, Per Tube, Q6H PRN, Harold Hedge, MD, 650 mg at 07/26/19 0458   aspirin chewable tablet 81 mg, 81 mg, Per Tube, Daily, Parrett, Tammy S, NP, 81 mg at 07/26/19 0847   bisacodyl (DULCOLAX) suppository 10 mg, 10 mg, Rectal, Daily PRN, Parrett, Tammy S, NP   budesonide (PULMICORT) nebulizer solution 0.25 mg, 0.25 mg, Nebulization, BID, Parrett, Tammy S, NP, 0.25 mg at 07/26/19 0850   cefdinir (OMNICEF) capsule 300 mg, 300 mg, Per Tube, Q12H, Harold Hedge, MD, 300 mg at 07/26/19 0846   chlorhexidine (PERIDEX) 0.12 % solution 15 mL, 15 mL, Mouth Rinse, BID, Harold Hedge, MD, 15 mL at 07/26/19 0847   Chlorhexidine Gluconate Cloth 2 % PADS 6 each, 6 each, Topical, Daily, Parrett, Tammy S, NP, 6 each at 07/25/19 1030   citalopram (CELEXA) tablet 10 mg, 10 mg, Per Tube, Daily, Parrett, Tammy S, NP, 10 mg at 07/26/19 0846   clopidogrel (PLAVIX) tablet 75 mg, 75 mg, Per Tube, Daily, Parrett, Tammy S, NP, 75 mg at 07/26/19 0846   feeding supplement (OSMOLITE 1.5 CAL) liquid 1,000 mL, 1,000 mL, Per Tube, Continuous, Harold Hedge, MD, Last Rate: 45  mL/hr at 07/26/19 1539, 1,000 mL at 07/26/19 1539   feeding supplement (PRO-STAT SUGAR FREE 64) liquid 60 mL, 60 mL, Oral, TID, Harold Hedge, MD, 60 mL at 07/26/19 0847   finasteride (PROSCAR) tablet 5 mg, 5 mg, Oral, Daily, Parrett, Tammy S, NP, 5 mg at 07/26/19 0847   guaiFENesin (MUCINEX) 12 hr tablet 600 mg, 600 mg, Oral, BID, Regalado, Belkys A, MD, 600 mg at 07/26/19 1309   guaiFENesin-dextromethorphan (ROBITUSSIN DM) 100-10 MG/5ML syrup 5  mL, 5 mL, Oral, Q4H PRN, Regalado, Belkys A, MD, 5 mL at 07/26/19 1252   heparin injection 5,000 Units, 5,000 Units, Subcutaneous, Q8H, Parrett, Tammy S, NP, 5,000 Units at 07/26/19 1320   insulin aspart (novoLOG) injection 0-15 Units, 0-15 Units, Subcutaneous, Q4H, Parrett, Tammy S, NP, 2 Units at 07/26/19 1215   ipratropium-albuterol (DUONEB) 0.5-2.5 (3) MG/3ML nebulizer solution 3 mL, 3 mL, Nebulization, Q6H, Regalado, Belkys A, MD, 3 mL at 07/26/19 1429   lidocaine (LIDODERM) 5 % 1 patch, 1 patch, Transdermal, Q24H, Harold Hedge, MD, 1 patch at 07/25/19 2033   MEDLINE mouth rinse, 15 mL, Mouth Rinse, q12n4p, Harold Hedge, MD, 15 mL at 07/26/19 1217   sennosides (SENOKOT) 8.8 MG/5ML syrup 5 mL, 5 mL, Per Tube, BID PRN, Parrett, Tammy S, NP, 5 mL at 07/21/19 1019   sodium chloride flush (NS) 0.9 % injection 10-40 mL, 10-40 mL, Intracatheter, Q12H, Parrett, Tammy S, NP, 10 mL at 07/26/19 0849   sodium chloride flush (NS) 0.9 % injection 10-40 mL, 10-40 mL, Intracatheter, PRN, Parrett, Tammy S, NP, 10 mL at 07/20/19 0958  Patients Current Diet:     Diet Order             Diet - low sodium heart healthy         Diet NPO time specified  Diet effective now               Precautions / Restrictions Precautions Precautions: Fall Precaution Comments: PEG Other Brace: L AFO Restrictions Weight Bearing Restrictions: No   Has the patient had 2 or more falls or a fall with injury in the past year? No  Prior Activity  Level Limited Community (1-2x/wk): recent DC home from CIR. Supervision/Min A level per family/pt report for most ambulation/ADLs; use of RW for ambulation. no falls at home prior to most recent admission  Prior Functional Level Self Care: Did the patient need help bathing, dressing, using the toilet or eating? Needed some help  Indoor Mobility: Did the patient need assistance with walking from room to room (with or without device)? Needed some help  Stairs: Did the patient need assistance with internal or external stairs (with or without device)? Needed some help  Functional Cognition: Did the patient need help planning regular tasks such as shopping or remembering to take medications? Needed some help  Home Assistive Devices / Equipment Home Equipment: Walker - 2 wheels, Tub bench  Prior Device Use: Indicate devices/aids used by the patient prior to current illness, exacerbation or injury? Walker  Current Functional Level Cognition  Overall Cognitive Status: Within Functional Limits for tasks assessed Current Attention Level: Sustained Orientation Level: Oriented X4 Following Commands: Follows one step commands consistently, Follows one step commands with increased time General Comments: appears WFL for tasks assessed, further assessment warrented    Extremity Assessment (includes Sensation/Coordination)  Upper Extremity Assessment: LUE deficits/detail, Generalized weakness LUE Deficits / Details: limited shoulder flexion 2-/5 (painful), elbow 3/5; limited Nord  LUE Sensation: decreased light touch LUE Coordination: decreased fine motor, decreased gross motor  Lower Extremity Assessment: Defer to PT evaluation    ADLs  Overall ADL's : Needs assistance/impaired Grooming: Minimal assistance, Sitting Upper Body Bathing: Minimal assistance, Sitting Lower Body Bathing: Maximal assistance, +2 for physical assistance, Sit to/from stand Upper Body Dressing : Moderate  assistance, Sitting Lower Body Dressing: Total assistance, +2 for physical assistance, Sit to/from stand Toilet Transfer: Moderate assistance, Maximal assistance, +2 for physical assistance, Stand-pivot, BSC Toilet  Transfer Details (indicate cue type and reason): using stedy Toileting- Clothing Manipulation and Hygiene: Total assistance, +2 for physical assistance, Sit to/from stand Functional mobility during ADLs: Moderate assistance, Maximal assistance, +2 for physical assistance General ADL Comments: pt limited by generalized weakness, decreased activity tolerance, L shoulder pain and decreased functional use of UE, impaired balance     Mobility  Overal bed mobility: Needs Assistance Bed Mobility: Sit to Supine Supine to sit: HOB elevated, Min assist Sit to supine: Max assist, +2 for physical assistance General bed mobility comments: requires min assist for UE/trunk, max assist for LB when returning to supine     Transfers  Overall transfer level: Needs assistance Equipment used: Rolling walker (2 wheeled) Transfer via Lift Equipment: Stedy Transfers: Sit to/from Stand, [employer] Transfers Sit to Stand: Mod assist, Max assist Stand pivot transfers: Total assist, +2 physical assistance General transfer comment: patient stood from [facility] with RW, mod-max assist but unable to sustain stand during toileting hygiene; utilized stedy to achieve full stand with mod assist; utilized steady to pivot to EOB     Ambulation / Gait / Stairs / Office manager / Balance Dynamic Sitting Balance Sitting balance - Comments: supervision seated on commode upon entry to room Balance Overall balance assessment: Needs assistance Sitting-balance support: Feet supported Sitting balance-Leahy Scale: Fair Sitting balance - Comments: supervision seated on commode upon entry to room Postural control: Left lateral lean Standing balance support: Bilateral upper extremity supported,  During functional activity Standing balance-Leahy Scale: Poor Standing balance comment: relaint on BUE and external support, noted L lateral lean when fatiguing in standing     Special needs/care consideration BiPAP/CPAP : yes, CPAP CPM : no Continuous Drip IV : feeding supplement (Osmolite 1.5 CAL) Dialysis : no        Days : no Life Vest : no Oxygen : on RA Special Bed : no Trach Size : no (decannulated last admission) Wound Vac (area) : no      Location : no Skin: ecchymosis, MASD: right and left groin; PEG tube  Bowel mgmt: Last BM: 10/3, continent Bladder mgmt: continent Diabetic mgmt: yes Behavioral consideration : no Chemo/radiation : no   Previous Home Environment (from acute therapy documentation) Living Arrangements: Spouse/significant other  Lives With: Spouse Available Help at Discharge: Family, Available 24 hours/day Type of Home: House Home Layout: Two level, Able to live on main level with bedroom/bathroom Alternate Level Stairs-Number of Steps: basement, but does not go in Home Access: Ramped entrance Entrance Stairs-Number of Steps: 4 stairs Bathroom Shower/Tub: Chiropodist: Handicapped height  Discharge Living Setting Plans for Discharge Living Setting: Patient's home, Lives with (comment)(wife and son) Type of Home at Discharge: House Discharge Home Layout: Two level, Able to live on main level with bedroom/bathroom(basement is "second level." no need to utilize basement) Alternate Level Stairs-Rails: None(NA) Alternate Level Stairs-Number of Steps: NA Discharge Home Access: Ramped entrance Discharge Bathroom Shower/Tub: Tub/shower unit Discharge Bathroom Toilet: Handicapped height Discharge Bathroom Accessibility: Yes How Accessible: Accessible via wheelchair, Accessible via walker Does the patient have any problems obtaining your medications?: No  Social/Family/Support Systems Patient Roles: Spouse, Parent Contact  Information: spouse: Mliss Sax [phone]) Anticipated Caregiver: wife and son (daugther lives nearby) Anticipated Ambulance person Information: see above Ability/Limitations of Caregiver: Min A for wife (wife has lifting restrictions of <5lbs due to cervical injury); son able to physically assist (both wife and son are on FMLA) Caregiver Availability: 24/7 Discharge  Plan Discussed with Primary Caregiver: Yes Is Caregiver In Agreement with Plan?: Yes Does Caregiver/Family have Issues with Lodging/Transportation while Pt is in Rehab?: No  Goals/Additional Needs Patient/Family Goal for Rehab: PT/OT: Supervision/Min G; SLP: Supervision Expected length of stay: 10-14 days Cultural Considerations: NA Dietary Needs: NPO; PEG  Equipment Needs: TBD Special Service Needs: PEG, suction, CPAP  Pt/Family Agrees to Admission and willing to participate: Yes Program Orientation Provided & Reviewed with Pt/Caregiver Including Roles  & Responsibilities: Yes(pt and wife)  Barriers to Discharge: Medical stability, Insurance for SNF coverage  Barriers to Discharge Comments: pt only home 2 days prior to return to hospital with septic shock.   Decrease burden of Care through IP rehab admission: Other   Possible need for SNF placement upon discharge: Not anticipated; pt and family understand the plan is home after CIR admission. Pt has great family support at both his wife and son have taken FMLA to assist him. Pt lives in an accessible home for entry and has good prognosis for further progress through CIR.   Patient Condition: I have reviewed medical records from [facility], spoken with MD, and patient and spouse. I met with patient at the bedside for inpatient rehabilitation assessment.  Patient will benefit from ongoing PT, OT and SLP, can actively participate in 3 hours of therapy a day 5 days of the week, and can make measurable gains during the admission.  Patient will also  benefit from the coordinated team approach during an Inpatient Acute Rehabilitation admission.  The patient will receive intensive therapy as well as Rehabilitation physician, nursing, social worker, and care management interventions.  Due to bladder management, safety, skin/wound care, disease management, medication administration, pain management and patient education the patient requires 24 hour a day rehabilitation nursing.  The patient is currently Mod A x2 for transfers no ambulation yet and Min to total A x2 for basic ADLs.  Discharge setting and therapy post discharge at home with home health is anticipated.  Patient has agreed to participate in the Acute Inpatient Rehabilitation Program and will admit 07/26/2019.  Preadmission Screen Completed By:  Raechel Ache, 07/26/2019 4:11 PM ______________________________________________________________________   Discussed status with Dr. Dagoberto Ligas on 07/26/2019 at 4:09PM and received approval for admission today.  Admission Coordinator:  Raechel Ache, OT, time 4:09PM/Date 07/26/2019   Assessment/Plan: Diagnosis: 1. Does the need for close, 24 hr/day Medical supervision in concert with the patient's rehab needs make it unreasonable for this patient to be served in a less intensive setting? Yes 2. Co-Morbidities requiring supervision/potential complications: [place], NPO due to dysphagia, residual L hemiparesis, DM, on IV ABX for sepsis/asp pneumonia/UTI 3. Due to bladder management, bowel management, safety, disease management, medication administration and patient education, does the patient require 24 hr/day rehab nursing? Yes 4. Does the patient require coordinated care of a physician, rehab nurse, PT, OT, and SLP 1-2 hours/day 5 days/week to address physical and functional deficits in the context of the above medical diagnosis(es)? Yes Addressing deficits in the following areas: balance, endurance, locomotion, strength, bowel/bladder control, bathing,  dressing, feeding, toileting, swallowing and psychosocial support 5. Can the patient actively participate in an intensive therapy program of at least 3 hrs of therapy 5 days a week? Yes 6. The potential for patient to make measurable gains while on inpatient rehab is good 7. Anticipated functional outcomes upon discharge from inpatient rehab: min assist PT, min assist OT, mod assist SLP 8. Estimated rehab length of stay to reach the above  functional goals is: 10-14 days 9. Anticipated discharge destination: Home 10. Overall Rehab/Functional Prognosis: fair   MD Signature:         Revision History Date/Time User Provider Type Action  07/26/2019 4:16 PM Courtney Heys, MD Physician Sign  07/26/2019 4:11 PM Raechel Ache, [place] [facility] Admission Coordinator Share  View Details Report

## 2019-07-26 NOTE — Progress Notes (Signed)
Received pt. As a new admission.Pt. was oriented to rehab.

## 2019-07-26 NOTE — Progress Notes (Signed)
  Speech Language Pathology Treatment: Dysphagia  Patient Details Name: Michael Bright MRN: [id number] DOB: Feb 23, 1956 Today's Date: 07/26/2019 Time: 1011-1030 SLP Time Calculation (min) (ACUTE ONLY): 19 min  Assessment / Plan / Recommendation Clinical Impression  Skilled treatment session focused on dysphagia. SLP facilitated session by providing supervision cues for pharyngeal strengthening exercises and provided pt with lemon glycerin swabs. Pt with immediate cough on trial of ice chip. Pt continues to perform strengthening exercises and voices understanding that recovery of swallow function is likely to be lengthy. ST to continue to follow.    HPI HPI: 61yM with DM, recent left medullary CVA complicated with trach (1/79) and g-tube placement. Trach decannulation 9/22 and discharged to [facility] 9/28.. Presented to [facility] 9/30 with dyspnea, productive cough, dysuria found to have septic shock with UTI/ aspiration pneumonia. Extubated 10/2      SLP Plan  Continue with current plan of care       Recommendations  Diet recommendations: NPO Medication Administration: Via alternative means                General recommendations: Rehab consult Oral Care Recommendations: Oral care QID Follow up Recommendations: Inpatient Rehab SLP Visit Diagnosis: Dysphagia, unspecified (R13.10) Plan: Continue with current plan of care       GO                Michael Bright 07/26/2019, 11:36 AM

## 2019-07-26 NOTE — IPOC Note (Signed)
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) (HCC)   Stroke, small vessel ([place])   Dysphagia due to recent cerebral infarction   Morbidly obese (HCC)   Urinary retention   Leukocytosis   Anemia of chronic disease   Controlled type 2 diabetes mellitus with hyperglycemia (HCC)     Functional Problem List: Nursing Medication Management, Nutrition, Pain, Bladder, Bowel, Endurance  PT Balance, Edema, Endurance, Motor, Nutrition, Pain, Skin Integrity  OT Balance, Cognition, Edema, Endurance, Motor, Pain, Perception, Safety  SLP Cognition, Nutrition  TR         Basic ADL's: OT Grooming, Bathing, Dressing     Advanced  ADL's: OT       Transfers: PT Bed Mobility, Bed to Chair, Car, Manufacturing systems engineer, Metallurgist: PT Ambulation, Emergency planning/management officer, Stairs     Additional Impairments: OT Fuctional Use of Upper Extremity  SLP Swallowing, Social Cognition   Memory  TR      Anticipated Outcomes Item Anticipated Outcome  Self Feeding n/a  Swallowing  Mod I   Basic self-care  contact guard  Toileting  contact guard   Bathroom Transfers contact guard  Bowel/Bladder  Manage bowel with mod I assist and bladder with min assist  Transfers  supervision to CGA for basic transfers  Locomotion  supervision to CGA for gait household distances; min assist stairs  Communication     Cognition  Supervision  Pain  Pain at or below 5/10  Safety/Judgment  Maintain safety with cues/reminders   Therapy Plan: PT Intensity: Minimum of 1-2 x/day ,45 to 90 minutes PT Frequency: 5 out of 7 days PT Duration Estimated Length of Stay: 12-14 days OT Intensity: Minimum of 1-2 x/day, 45 to 90 minutes OT Frequency: 5 out of 7 days OT  Duration/Estimated Length of Stay: ~2 weeks SLP Intensity: Minumum of 1-2 x/day, 30 to 90 minutes SLP Frequency: 3 to 5 out of 7 days SLP Duration/Estimated Length of Stay: 2 weeks    Team Interventions: Nursing Interventions Patient/Family Education, Bladder Management, Bowel Management, Pain Management, Medication Management, Psychosocial Support, Discharge Planning, Dysphagia/Aspiration Precaution Training, Disease Management/Prevention  PT interventions Ambulation/gait training, Training and development officer, Community reintegration, Discharge planning, Disease management/prevention, DME/adaptive equipment instruction, Functional mobility training, Neuromuscular re-education, Pain management, Functional electrical stimulation, Patient/family education, Psychosocial support, Skin care/wound management, Splinting/orthotics, Stair training, Therapeutic Activities, Therapeutic Exercise, UE/LE Strength taining/ROM, Wheelchair propulsion/positioning, UE/LE Coordination activities  OT Interventions Balance/vestibular training, Discharge planning, Pain management, Self Care/advanced ADL retraining, Therapeutic Activities, UE/LE Coordination activities, Visual/perceptual remediation/compensation, Therapeutic Exercise, Skin care/wound managment, Patient/family education, Functional mobility training, Disease mangement/prevention, Cognitive remediation/compensation, Academic librarian, Engineer, drilling, Neuromuscular re-education, Psychosocial support, UE/LE Strength taining/ROM, Wheelchair propulsion/positioning  SLP Interventions Cognitive remediation/compensation, English as a second language teacher, Dysphagia/aspiration precaution training, Functional tasks, Internal/external aids, Patient/family education, Therapeutic Activities, Therapeutic Exercise  TR Interventions    SW/CM Interventions Discharge Planning, Psychosocial Support, Patient/Family Education   Barriers to Discharge MD  Medical stability,  Weight and Nutritional means  Nursing Nutrition means Pt on continuous enteral feeding  PT Medical stability    OT      SLP      SW       Team Discharge Planning: Destination: PT-Home ,OT- Home , SLP-Home Projected Follow-up: PT-Home health PT, 24 hour supervision/assistance, OT-  Home  health OT, SLP-Home Health SLP, 24 hour supervision/assistance Projected Equipment Needs: PT-None recommended by PT, OT- To be determined, SLP-To be determined Equipment Details: PT-Pt already has RW and w/c from previous admission, OT-  Patient/family involved in discharge planning: PT- Patient,  OT-Patient, SLP-Patient  MD ELOS: 8-11 days. Medical Rehab Prognosis:  Excellent Assessment: 63 year old male with history of T2DM, OSA, morbid obesity, recent admission for left medullary stroke 06/28/19 complicated by VDRF due to aspiration and MSSA pneumonia requiring tracheostomy as well as prolonged course antibiotics, severe dysphagia--PEG placed on 9/15, urinary retention requiring I/O caths and underwent comprehensive rehab 09/08-09/28/20 due to functional decline. He tolerated decannulation by 09/22 and oral secretions were being managed with use of robinul and guaifenesin DM. He was discharged to home NPO with CGA for mobility due to dizziness and ataxia. He was readmitted on 07/20/19 with dyspnea with increase WOB requiring intubation and was found to have septic shock with hypotension and AKI. He was treated with broad spectrum antibiotics with IVF and found to have Klebsiella pneumoniae bacteremia with UTI and bilateral lung infiltrates due to rhino virus. He tolerated extubation by 10/2 and cefazolin narrowed to cefdinir on 10/05. Intermittent hypoxic episodes resolving and acute respiratory failure felt to bed in secondary to rhino virus in setting of sever dysphagia. Abnormal LFTs due to shocked liver as well as AKI have resolved. Pt is requiring a lot of oral suctioning due to excessive secretions,  however feels like can keep up with in rehab, and doesn't want to wait until tomorrow to come to rehab. Patient with resulting functional deficits with mobility, endurance, transfers, self-care, swallowing. Will set goals for CGA with PT/OT and Supervision with SLP.  Due to the current state of emergency, patients may not be receiving their 3-hours of Medicare-mandated therapy.  See Team Conference Notes for weekly updates to the plan of care

## 2019-07-26 NOTE — Discharge Summary (Signed)
Physician Discharge Summary  Michael Bright ZOX:[id number] DOB: 11-06-1955 DOA: 07/19/2019  PCP: Patient, No Pcp Per  Admit date: 07/19/2019 Discharge date: 07/26/2019  Admitted From: SNF Disposition: CIR  Recommendations for Outpatient Follow-up:  1. Follow up with PCP in 1-2 weeks 2. Please obtain BMP/CBC in one week. 3. Needs to complete antibiotics.     Discharge Condition: Stable. 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  Trach decannulation on 9/20-second and discharged to SNF on 9/28.  He presented to [facility] con on 9/30 with dyspnea, productive cough and dysuria found to have septic shock with UTI and aspiration pneumonia, he received 3 L of normal saline in the ED and was a started on antibiotics.  Initially on BiPAP however his clinical status continued to worsen and patient was intubated and started on norepinephrine and was admitted to the ICU.  Patient was weaned off pressors and extubated on 10/2 and continued on Ancef.  Hospitalist took over care on 10/4.    Over the last 2 3 days patient continued to require frequent respiratory suctioning and had developed intermittent hypoxic episode, he remained on CPAP nightly. Today he continued to be short of breath with a lot of respiratory secretion, continue to require frequent suctioning, I will start guaifenesin and respiratory treatment.  Patient is not a stable to be discharged home, he require higher level of care like CIR. will respiratory status improved , plan to transfer to CIR today    1-Septic shock secondary to Klebsiella bacteremia and UTI combination with rhinovirus: Patient required levo fed during this hospitalization.  He also require mechanical intubation.  He was extubated on 10/2. Plan was to treat patient for 14 days.  Ancef was  changed to Ceftin near on 10/5. Today is day 8 of antibiotics.  2-multifocal pneumonia: Patient continue to have a lot of respiratory secretion requiring intermittent suctioning.  I will continue with pulmonary toilet.  He continues to require CPAP nightly.  Over the last 2 days he has required frequent suctioning and has become intermittent hypoxic. Patient required higher level of care prior to discharge home like CIR. We will add nebulizer today and guaifenesin for cough.  3-acute hypoxic respiratory failure in the setting of rhinovirus requiring intubation: Improving Still requiring CPAP at night. AKI resolved monitor History of medullary stroke 8/20 first required G-tube. Had trach decannulation on 9/22.  Statins on hold due to transaminitis repeat liver function test and consider resuming statin Lower extremity edema continue with TED hose Diabetes: Continue with sliding scale Transaminases; could be related to septic shock.  Repeat labs in the morning    Discharge Diagnoses:  Active Problems:   Septic shock [facility])    Discharge Instructions  Discharge Instructions    Diet - low sodium heart healthy   Complete by: As directed    Increase activity slowly   Complete by: As directed      Allergies as of 07/26/2019   No Known Allergies     Medication List    STOP taking these medications   amLODipine 10 MG tablet Commonly known as: NORVASC   atorvastatin 40 MG tablet Commonly known as: LIPITOR   BASAGLAR KWIKPEN [place]   bethanechol 25 MG tablet Commonly known as: URECHOLINE   blood glucose meter kit and supplies Kit   glycopyrrolate 1 MG tablet Commonly known as: ROBINUL  Insulin Glargine 100 UNIT/ML Solostar Pen Commonly known as: LANTUS     TAKE these medications   acetaminophen 325 MG tablet Commonly known as: TYLENOL Place 1-2 tablets (325-650 mg total) into feeding tube every 4 (four) hours as needed for mild pain.   aspirin 81 MG chewable  tablet Place 1 tablet (81 mg total) into feeding tube daily.   budesonide 0.25 MG/2ML nebulizer solution Commonly known as: PULMICORT Take 2 mLs (0.25 mg total) by nebulization 2 (two) times daily.   cefdinir 300 MG capsule Commonly known as: OMNICEF Place 1 capsule (300 mg total) into feeding tube every 12 (twelve) hours for 2 days.   citalopram 10 MG tablet Commonly known as: CELEXA Place 1 tablet (10 mg total) into feeding tube daily.   clopidogrel 75 MG tablet Commonly known as: PLAVIX Place 1 tablet (75 mg total) into feeding tube daily.   famotidine 40 MG/5ML suspension Commonly known as: PEPCID Place 2.5 mLs (20 mg total) into feeding tube 2 (two) times daily.   feeding supplement (OSMOLITE 1.5 CAL) Liqd Place 474 mLs into feeding tube 3 (three) times daily with meals.   feeding supplement (PRO-STAT SUGAR FREE 64) Liqd Place 30 mLs into feeding tube 2 (two) times daily.   finasteride 5 MG tablet Commonly known as: PROSCAR Put in tube   free water Soln Place 200 mLs into feeding tube 4 (four) times daily -  with meals and at bedtime. Use filtered or bottled water (not distilled water)   guaiFENesin 600 MG 12 hr tablet Commonly known as: MUCINEX Take 1 tablet (600 mg total) by mouth 2 (two) times daily.   guaiFENesin-dextromethorphan 100-10 MG/5ML syrup Commonly known as: ROBITUSSIN DM Place 15 mLs into feeding tube every 6 (six) hours.   ipratropium-albuterol 0.5-2.5 (3) MG/3ML Soln Commonly known as: DUONEB Take 3 mLs by nebulization every 6 (six) hours.   lidocaine 5 % Commonly known as: LIDODERM Place 1 patch onto the skin daily. Remove & Discard patch within 12 hours or as directed by MD   mouth rinse Liqd solution 15 mLs by Mouth Rinse route 5 (five) times daily.   Pen Needles 32G X 6 MM Misc 1 application by Does not apply route daily.   QUEtiapine 100 MG tablet Commonly known as: SEROQUEL Place 1 tablet (100 mg total) into feeding tube at  bedtime.   tamsulosin 0.4 MG Caps capsule Commonly known as: FLOMAX Open capsule and administer medication via PEG tube       No Known Allergies  Consultations:  CCM   Procedures/Studies: Ct Abdomen Wo Contrast  Result Date: 06/28/2019 CLINICAL DATA:  Dysphagia. Evaluate anatomy for percutaneous gastrostomy tube placement. EXAM: CT ABDOMEN WITHOUT CONTRAST TECHNIQUE: Multidetector CT imaging of the abdomen was performed following the standard protocol without IV contrast. COMPARISON:  Chest CT 06/26/2019 FINDINGS: Lower chest: Stable areas of consolidation involving both lower lobes. No significant pleural effusions. Hepatobiliary: Cholecystectomy. Normal appearance of the liver on this non contrast examination. Pancreas: Unremarkable. No pancreatic ductal dilatation or surrounding inflammatory changes. Spleen: Normal in size without focal abnormality. Adrenals/Urinary Tract: Normal adrenal glands. At least 2 tiny stones in left kidney lower pole. Tiny stone in the right kidney upper pole. Negative for hydronephrosis. Top of the bladder is partially visualized. Stomach/Bowel: There is a nasogastric tube with the tip in the distal stomach. There is a small amount of barium in the gastric fundus. Normal appearance of the stomach and duodenum. Barium within the transverse colon which is  caudal to the stomach. The anatomy is amendable for percutaneous gastrostomy tube placement. No evidence to suggest a bowel obstruction. Evidence for high-riding cecum. Visualized appendix is unremarkable. Vascular/Lymphatic: Small amount of calcium involving the right common iliac artery. Otherwise, the visualized vascular structures are unremarkable. No significant lymph node enlargement in the abdomen. Other: Negative for ascites.  Negative for free air. Musculoskeletal: No acute bone abnormality. IMPRESSION: 1. Anatomy is suitable for percutaneous gastrostomy tube placement. However, the barium will need to pass  through the stomach prior to gastrostomy tube placement. 2. Stable consolidation and volume loss in the bilateral lower lobes. Findings are similar to the recent chest CT. 3. Nonobstructive tiny kidney stones. Electronically Signed   By: Markus Daft M.D.   On: 06/28/2019 10:32   Dg Chest 1 View  Result Date: 06/29/2019 CLINICAL DATA:  Aspiration EXAM: CHEST  1 VIEW COMPARISON:  06/27/2019 FINDINGS: Cardiac shadow is enlarged but stable. Tracheostomy tube and feeding catheter are seen. Lungs are well aerated bilaterally. Minimal left basilar atelectasis is noted. No bony abnormality is seen. IMPRESSION: Minimal left basilar atelectasis. Electronically Signed   By: Inez Catalina M.D.   On: 06/29/2019 08:58   Dg Chest 2 View  Result Date: 07/14/2019 CLINICAL DATA:  Productive cough. EXAM: CHEST - 2 VIEW COMPARISON:  Radiograph of June 29, 2019. FINDINGS: Stable cardiomegaly. No pneumothorax or pleural effusion is noted. Minimal bibasilar subsegmental atelectasis is noted. Bony thorax is unremarkable. IMPRESSION: Minimal bibasilar subsegmental atelectasis. Electronically Signed   By: Marijo Conception M.D.   On: 07/14/2019 14:59   Dg Chest 2 View  Result Date: 06/27/2019 CLINICAL DATA:  Aspiration EXAM: CHEST - 2 VIEW COMPARISON:  06/22/2019, CT 06/26/2019 FINDINGS: Tracheostomy tube is in place. Streaky basilar airspace opacities without significant change. Stable cardiomediastinal silhouette. No pneumothorax. IMPRESSION: 1. Similar appearance of patchy basilar airspace opacities. No new abnormality is evident. 2. Borderline cardiomegaly Electronically Signed   By: Donavan Foil M.D.   On: 06/27/2019 21:58   Dg Abd 1 View  Result Date: 07/20/2019 CLINICAL DATA:  OG tube placement. EXAM: ABDOMEN - 1 VIEW COMPARISON:  None FINDINGS: Tip and side port of the enteric tube below the diaphragm in the stomach. Gastrostomy tube also projects over the gastric bubble. Relative paucity of bowel gas.  Cholecystectomy clips in the right upper quadrant. IMPRESSION: Tip and side port of the enteric tube below the diaphragm in the stomach. Gastrostomy tube projects over the gastric bubble. Electronically Signed   By: Keith Rake M.D.   On: 07/20/2019 03:37   Ir Gastrostomy Tube Mod Sed  Result Date: 07/04/2019 INDICATION: 63 year old male with recent cerebral infarct and dysphagia. He requires percutaneous gastrostomy tube placement. EXAM: Fluoroscopically guided placement of percutaneous pull-through gastrostomy tube Interventional Radiologist:  Criselda Peaches, MD MEDICATIONS: 2 g Ancef; 1 mg glucagon antibiotics were administered within 1 hour of the procedure. ANESTHESIA/SEDATION: Versed 1 mg IV; Fentanyl 50 mcg IV Moderate Sedation Time:  15 minutes The patient was continuously monitored during the procedure by the interventional radiology nurse under my direct supervision. CONTRAST:  75m OMNIPAQUE IOHEXOL 300 MG/ML  SOLN FLUOROSCOPY TIME:  Fluoroscopy Time: 1 minutes 26 seconds (27 mGy). COMPLICATIONS: None immediate. PROCEDURE: Informed written consent was obtained from the patient after a thorough discussion of the procedural risks, benefits and alternatives. All questions were addressed. Maximal Sterile Barrier Technique was utilized including caps, mask, sterile gowns, sterile gloves, sterile drape, hand hygiene and skin antiseptic. A timeout was performed  prior to the initiation of the procedure. Maximal barrier sterile technique utilized including caps, mask, sterile gowns, sterile gloves, large sterile drape, hand hygiene, and chlorhexadine skin prep. An angled catheter was advanced over a wire under fluoroscopic guidance through the nose, down the esophagus and into the body of the stomach. The stomach was then insufflated with several 100 ml of air. Fluoroscopy confirmed location of the gastric bubble, as well as inferior displacement of the barium stained colon. Under direct fluoroscopic  guidance, a single T-tack was placed, and the anterior gastric wall drawn up against the anterior abdominal wall. Percutaneous access was then obtained into the mid gastric body with an 18 gauge sheath needle. Aspiration of air, and injection of contrast material under fluoroscopy confirmed needle placement. An Amplatz wire was advanced in the gastric body and the access needle exchanged for a 9-French vascular sheath. A snare device was advanced through the vascular sheath and an Amplatz wire advanced through the angled catheter. The Amplatz wire was successfully snared and this was pulled up through the esophagus and out the mouth. A 20-French Alinda Dooms MIC-PEG tube was then connected to the snare and pulled through the mouth, down the esophagus, into the stomach and out to the anterior abdominal wall. Hand injection of contrast material confirmed intragastric location. The T-tack retention suture was then cut. The pull through peg tube was then secured with the external bumper and capped. The patient will be observed for several hours with the newly placed tube on low wall suction to evaluate for any post procedure complication. The patient tolerated the procedure well, there is no immediate complication. IMPRESSION: Successful placement of a 20 French pull through gastrostomy tube. Electronically Signed   By: Jacqulynn Cadet M.D.   On: 07/04/2019 17:32   US Renal  Result Date: 07/21/2019 CLINICAL DATA:  Pyelonephritis EXAM: RENAL / URINARY TRACT ULTRASOUND COMPLETE COMPARISON:  None. FINDINGS: Right Kidney: Renal measurements: 13.4 x 6.2 x 5.9 cm = volume: 257 mL . Echogenicity within normal limits. No mass or hydronephrosis visualized. Left Kidney: Renal measurements: 13.7 x 7.7 x 5.7 cm = volume: 310 mL. Echogenicity within normal limits. No mass or hydronephrosis visualized. Bladder: Appears normal for degree of bladder distention. IMPRESSION: 1. Normal renal ultrasound. 2. Normal bladder.  Electronically Signed   By: Suzy Bouchard M.D.   On: 07/21/2019 17:07   Dg Chest Port 1 View  Result Date: 07/22/2019 CLINICAL DATA:  Follow-up pneumonia. EXAM: PORTABLE CHEST 1 VIEW COMPARISON:  07/20/2019 FINDINGS: Interval removal of multiple tubes and lines. Lungs are adequately inflated with minimal residual patchy density left mid to lower lung likely atelectasis or infection. No significant effusion. Mild stable cardiomegaly. Remainder of the exam is unchanged. IMPRESSION: Interval improvement with minimal residual patchy density over the left mid to lower lung likely improving atelectasis or infection. Electronically Signed   By: Marin Olp M.D.   On: 07/22/2019 11:59   Dg Chest Port 1 View  Result Date: 07/20/2019 CLINICAL DATA:  Central line placement. EXAM: PORTABLE CHEST 1 VIEW COMPARISON:  Radiograph earlier this day. FINDINGS: Tip the left internal jugular central venous catheter in the mid SVC. No pneumothorax. Endotracheal tube tip 13 mm from the carina, slightly low in positioning. Enteric tube in place tip below the diaphragm not included in the field of view. Unchanged heart size and mediastinal contours. Multifocal bilateral airspace disease, worsening in the right suprahilar region from prior. More confluent retrocardiac opacity. Possible left pleural effusion. IMPRESSION: 1.  Tip of the left internal jugular central venous catheter in the mid SVC. No pneumothorax. 2. Endotracheal tube tip remains slightly low in positioning 13 mm from the carina. Recommend retraction of 1-2 cm. 3. Multifocal bilateral airspace disease suspicious for pneumonia, slight worsening in the right suprahilar lung. Electronically Signed   By: Keith Rake M.D.   On: 07/20/2019 03:36   Dg Chest Port 1 View  Addendum Date: 07/20/2019   ADDENDUM REPORT: 07/20/2019 02:55 ADDENDUM: These results were called by telephone at the time of interpretation on 07/20/2019 at 2:55 am to provider [facility] , who  verbally acknowledged these results. Electronically Signed   By: Lovena Le M.D.   On: 07/20/2019 02:55   Result Date: 07/20/2019 CLINICAL DATA:  Post intubation and OG placement EXAM: PORTABLE CHEST 1 VIEW COMPARISON:  Same-day radiograph FINDINGS: Endotracheal tube is positioned low within the trachea approximately 1 cm from the carina and should be retracted 2-3 cm. A transesophageal tube tip and side port distal to the GE junction, below the level of imaging. Redemonstration of the bilateral airspace opacities most confluent in the retrocardiac space. With few scattered right lower lung air bronchograms more pronounced on this exam. No pneumothorax. No visible effusion. No acute osseous or soft tissue abnormality. IMPRESSION: 1. Endotracheal tube is positioned low within the trachea, approximately 1 cm from the carina and should be retracted 2-3 cm to the mid trachea. 2. Unchanged bilateral airspace opacities, most confluent in the retrocardiac space, consistent with aspiration and/or pneumonia. These results will be called to the ordering clinician or representative by the Radiologist Assistant, and communication documented in the PACS or zVision Dashboard. Electronically Signed: By: Lovena Le M.D. On: 07/20/2019 02:46   Dg Chest Portable 1 View  Result Date: 07/20/2019 CLINICAL DATA:  Hypoxia, syncopal episode witnessed EXAM: PORTABLE CHEST 1 VIEW COMPARISON:  Radiograph 07/14/2019 FINDINGS: Increasing patchy and streaky bibasilar areas of opacity as well as more focal opacity in the retrocardiac space. No pneumothorax or visible effusion. No convincing features of edema. Prominence of the cardiac silhouette is likely related to the portable technique. No acute osseous or soft tissue abnormality. IMPRESSION: Patchy and streaky bibasilar areas of opacity as well as more focal opacity in the retrocardiac space. Findings could reflect sequela of aspiration in the appropriate setting versus pneumonia.  Electronically Signed   By: Lovena Le M.D.   On: 07/20/2019 00:59   Dg Chest Port 1 View  Result Date: 07/19/2019 CLINICAL DATA:  Recent syncopal episode and cough EXAM: PORTABLE CHEST 1 VIEW COMPARISON:  07/14/2019 FINDINGS: Cardiac shadows within normal limits. Mild aortic calcifications are again seen. The lungs are well aerated bilaterally with mild bibasilar atelectasis stable from the previous exam. No new focal infiltrate is seen. No bony abnormality is noted. IMPRESSION: Stable bibasilar atelectasis. Electronically Signed   By: Inez Catalina M.D.   On: 07/19/2019 21:54   Dg Swallowing Func-speech Pathology  Result Date: 07/17/2019 Please refer to "Notes" tab for Speech Pathology notes.  Dg Swallowing Func-speech Pathology  Result Date: 07/13/2019 Objective Swallowing Evaluation: Type of Study: MBS-Modified Barium Swallow Study  Patient Details Name: Amyr Sluder MRN: [id number] Date of Birth: Jan 23, 1956 Today's Date: 07/13/2019 Time: SLP Start Time (ACUTE ONLY): 0910 -SLP Stop Time (ACUTE ONLY): 0935 SLP Time Calculation (min) (ACUTE ONLY): 25 min Past Medical History: Past Medical History: Diagnosis Date . Diabetes [facility])  Past Surgical History: Past Surgical History: Procedure Laterality Date . CHOLECYSTECTOMY   . IR GASTROSTOMY  TUBE MOD SED  07/04/2019 HPI: See H&P  Subjective: alert, cooperative, motivated Assessment / Plan / Recommendation CHL IP CLINICAL IMPRESSIONS 07/13/2019 Clinical Impression Patient demonstrates minimal change in swallowing function since initial MBS on 06/27/19. Patient continues to demonstrate a severe pharyngeal swallow and demonstrated frank aspiration with ice chips. Severely reduced mobility of patient's pharyngeal musculature with reduced UES relaxation and opening led to accumulation of thin liquids above the UES resulting in liquids spilling over into the airway posteriorly.  Recommend patient remain NPO with PEG. Patient's progress will likely be slow and  prolonged and patient would benefit from continued intensive swallowing therapy. Patient educated on results and verbalized understanding. SLP Visit Diagnosis Dysphagia, pharyngeal phase (R13.13) Attention and concentration deficit following -- Frontal lobe and executive function deficit following -- Impact on safety and function Severe aspiration risk   CHL IP TREATMENT RECOMMENDATION 07/13/2019 Treatment Recommendations Therapy as outlined in treatment plan below   Prognosis 07/13/2019 Prognosis for Safe Diet Advancement Fair Barriers to Reach Goals Severity of deficits Barriers/Prognosis Comment -- CHL IP DIET RECOMMENDATION 07/13/2019 SLP Diet Recommendations Alternative means - long-term;NPO Liquid Administration via -- Medication Administration Via alternative means Compensations -- Postural Changes --   CHL IP OTHER RECOMMENDATIONS 07/13/2019 Recommended Consults -- Oral Care Recommendations Oral care QID Other Recommendations Have oral suction available   CHL IP FOLLOW UP RECOMMENDATIONS 07/13/2019 Follow up Recommendations Outpatient SLP;Home health SLP   CHL IP FREQUENCY AND DURATION 07/13/2019 Speech Therapy Frequency (ACUTE ONLY) min 5x/week Treatment Duration 1 week      CHL IP ORAL PHASE 07/13/2019 Oral Phase WFL Oral - Pudding Teaspoon -- Oral - Pudding Cup -- Oral - Honey Teaspoon -- Oral - Honey Cup -- Oral - Nectar Teaspoon -- Oral - Nectar Cup -- Oral - Nectar Straw -- Oral - Thin Teaspoon -- Oral - Thin Cup -- Oral - Thin Straw -- Oral - Puree -- Oral - Mech Soft -- Oral - Regular -- Oral - Multi-Consistency -- Oral - Pill -- Oral Phase - Comment --  CHL IP PHARYNGEAL PHASE 07/13/2019 Pharyngeal Phase Impaired Pharyngeal- Pudding Teaspoon -- Pharyngeal -- Pharyngeal- Pudding Cup -- Pharyngeal -- Pharyngeal- Honey Teaspoon -- Pharyngeal -- Pharyngeal- Honey Cup -- Pharyngeal -- Pharyngeal- Nectar Teaspoon -- Pharyngeal -- Pharyngeal- Nectar Cup -- Pharyngeal -- Pharyngeal- Nectar Straw -- Pharyngeal --  Pharyngeal- Thin Teaspoon (Ice chips) Reduced epiglottic inversion;Reduced anterior laryngeal mobility;Reduced laryngeal elevation;Reduced airway/laryngeal closure;Reduced tongue base retraction;Penetration/Aspiration during swallow;Significant aspiration (Amount);Pharyngeal residue - pyriform Pharyngeal Material enters airway, passes BELOW cords and not ejected out despite cough attempt by patient Pharyngeal- Thin Cup -- Pharyngeal -- Pharyngeal- Thin Straw NT Pharyngeal -- Pharyngeal- Puree -- Pharyngeal -- Pharyngeal- Mechanical Soft -- Pharyngeal -- Pharyngeal- Regular -- Pharyngeal -- Pharyngeal- Multi-consistency -- Pharyngeal -- Pharyngeal- Pill -- Pharyngeal -- Pharyngeal Comment --  No flowsheet data found. PAYNE, COURTNEY 07/13/2019, 10:44 AM Weston Anna, MA, CCC-SLP [phone]                 Subjective: He continues to have cough a lot of respiratory secretions shortness of breath at times overall improvement  Discharge Exam: Vitals:   07/26/19 0800 07/26/19 1429  BP: 114/61   Pulse: 79   Resp: 18   Temp: (!) 97.4 F (36.3 C)   SpO2: 97% 96%     General: Pt is alert, awake, not in acute distress Cardiovascular: RRR, S1/S2 +, no rubs, no gallops Respiratory: CTA bilaterally, no wheezing, no rhonchi Abdominal: Soft, NT, ND,  bowel sounds + Extremities: no edema, no cyanosis    The results of significant diagnostics from this hospitalization (including imaging, microbiology, ancillary and laboratory) are listed below for reference.     Microbiology: Recent Results (from the past 240 hour(s))  Blood Culture (routine x 2)     Status: Abnormal   Collection Time: 07/19/19  8:46 PM   Specimen: BLOOD LEFT HAND  Result Value Ref Range Status   Specimen Description BLOOD LEFT HAND  Final   Special Requests   Final    BOTTLES DRAWN AEROBIC AND ANAEROBIC Blood Culture results may not be optimal due to an inadequate volume of blood received in culture bottles   Culture  Setup  Time   Final    IN BOTH AEROBIC AND ANAEROBIC BOTTLES GRAM NEGATIVE RODS CRITICAL RESULT CALLED TO, READ BACK BY AND VERIFIED WITH: Hughie Closs [facility] 07/20/19 2201 JDW Performed at [facility], [address] [place],  [address]    Culture KLEBSIELLA PNEUMONIAE (A)  Final   Report Status 07/23/2019 FINAL  Final   Organism ID, Bacteria KLEBSIELLA PNEUMONIAE  Final      Susceptibility   Klebsiella pneumoniae - MIC*    AMPICILLIN >=32 RESISTANT Resistant     CEFAZOLIN <=4 SENSITIVE Sensitive     CEFEPIME <=1 SENSITIVE Sensitive     CEFTAZIDIME <=1 SENSITIVE Sensitive     CEFTRIAXONE <=1 SENSITIVE Sensitive     CIPROFLOXACIN <=0.25 SENSITIVE Sensitive     GENTAMICIN <=1 SENSITIVE Sensitive     IMIPENEM <=0.25 SENSITIVE Sensitive     TRIMETH/SULFA <=20 SENSITIVE Sensitive     AMPICILLIN/SULBACTAM 4 SENSITIVE Sensitive     PIP/TAZO <=4 SENSITIVE Sensitive     * KLEBSIELLA PNEUMONIAE  Blood Culture ID Panel (Reflexed)     Status: Abnormal   Collection Time: 07/19/19  8:46 PM  Result Value Ref Range Status   Enterococcus species NOT DETECTED NOT DETECTED Final   Listeria monocytogenes NOT DETECTED NOT DETECTED Final   Staphylococcus species NOT DETECTED NOT DETECTED Final   Staphylococcus aureus (BCID) NOT DETECTED NOT DETECTED Final   Streptococcus species NOT DETECTED NOT DETECTED Final   Streptococcus agalactiae NOT DETECTED NOT DETECTED Final   Streptococcus pneumoniae NOT DETECTED NOT DETECTED Final   Streptococcus pyogenes NOT DETECTED NOT DETECTED Final   Acinetobacter baumannii NOT DETECTED NOT DETECTED Final   Enterobacteriaceae species DETECTED (A) NOT DETECTED Final    Comment: Enterobacteriaceae represent a large family of gram-negative bacteria, not a single organism. CRITICAL RESULT CALLED TO, READ BACK BY AND VERIFIED WITH: Hughie Closs [facility] 07/20/19 2201 JDW    Enterobacter cloacae complex NOT DETECTED NOT DETECTED Final   Escherichia coli NOT DETECTED NOT  DETECTED Final   Klebsiella oxytoca NOT DETECTED NOT DETECTED Final   Klebsiella pneumoniae DETECTED (A) NOT DETECTED Final    Comment: CRITICAL RESULT CALLED TO, READ BACK BY AND VERIFIED WITH: Hughie Closs [facility] 07/20/19 2201 JDW    Proteus species NOT DETECTED NOT DETECTED Final   Serratia marcescens NOT DETECTED NOT DETECTED Final   Carbapenem resistance NOT DETECTED NOT DETECTED Final   Haemophilus influenzae NOT DETECTED NOT DETECTED Final   Neisseria meningitidis NOT DETECTED NOT DETECTED Final   Pseudomonas aeruginosa NOT DETECTED NOT DETECTED Final   Candida albicans NOT DETECTED NOT DETECTED Final   Candida glabrata NOT DETECTED NOT DETECTED Final   Candida krusei NOT DETECTED NOT DETECTED Final   Candida parapsilosis NOT DETECTED NOT DETECTED Final  Candida tropicalis NOT DETECTED NOT DETECTED Final    Comment: Performed at [facility], [place] [address] [place], [place] [address]  Urine culture     Status: Abnormal   Collection Time: 07/19/19  9:10 PM   Specimen: In/Out Cath Urine  Result Value Ref Range Status   Specimen Description IN/OUT CATH URINE  Final   Special Requests   Final    NONE Performed at [facility], [place] [address], [place], [place] [address]    Culture >=100,000 COLONIES/mL KLEBSIELLA PNEUMONIAE (A)  Final   Report Status 07/21/2019 FINAL  Final   Organism ID, Bacteria KLEBSIELLA PNEUMONIAE (A)  Final      Susceptibility   Klebsiella pneumoniae - MIC*    AMPICILLIN >=32 RESISTANT Resistant     CEFAZOLIN <=4 SENSITIVE Sensitive     CEFTRIAXONE <=1 SENSITIVE Sensitive     CIPROFLOXACIN <=0.25 SENSITIVE Sensitive     GENTAMICIN <=1 SENSITIVE Sensitive     IMIPENEM <=0.25 SENSITIVE Sensitive     NITROFURANTOIN 128 RESISTANT Resistant     TRIMETH/SULFA <=20 SENSITIVE Sensitive     AMPICILLIN/SULBACTAM 8 SENSITIVE Sensitive     PIP/TAZO <=4 SENSITIVE Sensitive     Extended ESBL NEGATIVE Sensitive     * >=100,000 COLONIES/mL KLEBSIELLA  PNEUMONIAE  SARS Coronavirus 2 [facility] order, Performed in [place] hospital lab) Nasopharyngeal     Status: None   Collection Time: 07/19/19  9:10 PM   Specimen: Nasopharyngeal  Result Value Ref Range Status   SARS Coronavirus 2 NEGATIVE NEGATIVE Final    Comment: (NOTE) If result is NEGATIVE SARS-CoV-2 target nucleic acids are NOT DETECTED. The SARS-CoV-2 RNA is generally detectable in upper and lower  respiratory specimens during the acute phase of infection. The lowest  concentration of SARS-CoV-2 viral copies this assay can detect is 250  copies / mL. A negative result does not preclude SARS-CoV-2 infection  and should not be used as the sole basis for treatment or other  patient management decisions.  A negative result may occur with  improper specimen collection / handling, submission of specimen other  than nasopharyngeal swab, presence of viral mutation(s) within the  areas targeted by this assay, and inadequate number of viral copies  (<250 copies / mL). A negative result must be combined with clinical  observations, patient history, and epidemiological information. If result is POSITIVE SARS-CoV-2 target nucleic acids are DETECTED. The SARS-CoV-2 RNA is generally detectable in upper and lower  respiratory specimens dur ing the acute phase of infection.  Positive  results are indicative of active infection with SARS-CoV-2.  Clinical  correlation with patient history and other diagnostic information is  necessary to determine patient infection status.  Positive results do  not rule out bacterial infection or co-infection with other viruses. If result is PRESUMPTIVE POSTIVE SARS-CoV-2 nucleic acids MAY BE PRESENT.   A presumptive positive result was obtained on the submitted specimen  and confirmed on repeat testing.  While 2019 novel coronavirus  (SARS-CoV-2) nucleic acids may be present in the submitted sample  additional confirmatory testing may be necessary for  epidemiological  and / or clinical management purposes  to differentiate between  SARS-CoV-2 and other Sarbecovirus currently known to infect humans.  If clinically indicated additional testing with an alternate test  methodology [phone]) is advised. The SARS-CoV-2 RNA is generally  detectable in upper and lower respiratory sp ecimens during the acute  phase of infection. The expected result is Negative. Fact  Sheet for Patients:  StrictlyIdeas.no Fact Sheet for Healthcare Providers: BankingDealers.co.za This test is not yet approved or cleared by the [place] FDA and has been authorized for detection and/or diagnosis of SARS-CoV-2 by FDA under an Emergency Use Authorization (EUA).  This EUA will remain in effect (meaning this test can be used) for the duration of the COVID-19 declaration under Section 564(b)(1) of the Act, 21 U.S.C. section 360bbb-3(b)(1), unless the authorization is terminated or revoked sooner. Performed at [facility], [place] [address] [place], [place] [address]   Blood Culture (routine x 2)     Status: None   Collection Time: 07/19/19 10:20 PM   Specimen: BLOOD  Result Value Ref Range Status   Specimen Description BLOOD BLOOD LEFT FOREARM  Final   Special Requests   Final    BOTTLES DRAWN AEROBIC AND ANAEROBIC Blood Culture adequate volume   Culture   Final    NO GROWTH 5 DAYS Performed at [facility], [place] [address], [place], [place] [address]    Report Status 07/25/2019 FINAL  Final  MRSA PCR Screening     Status: None   Collection Time: 07/20/19  3:58 AM  Result Value Ref Range Status   MRSA by PCR NEGATIVE NEGATIVE Final    Comment:        The GeneXpert MRSA Assay (FDA approved for NASAL specimens only), is one component of a comprehensive MRSA colonization surveillance program. It is not intended to diagnose MRSA infection nor to guide or monitor treatment for MRSA  infections. Performed at [facility], [place] [address], [place], [place] [address]   Culture, respiratory (non-expectorated)     Status: None   Collection Time: 07/20/19  5:29 AM   Specimen: Tracheal Aspirate; Respiratory  Result Value Ref Range Status   Specimen Description TRACHEAL ASPIRATE  Final   Special Requests NONE  Final   Gram Stain   Final    ABUNDANT WBC PRESENT, PREDOMINANTLY PMN RARE GRAM POSITIVE COCCI Performed at [facility], [place] [address], [place], [place] [address]    Culture RARE STAPHYLOCOCCUS AUREUS  Final   Report Status 07/22/2019 FINAL  Final   Organism ID, Bacteria STAPHYLOCOCCUS AUREUS  Final      Susceptibility   Staphylococcus aureus - MIC*    CIPROFLOXACIN <=0.5 SENSITIVE Sensitive     ERYTHROMYCIN <=0.25 SENSITIVE Sensitive     GENTAMICIN <=0.5 SENSITIVE Sensitive     OXACILLIN 0.5 SENSITIVE Sensitive     TETRACYCLINE <=1 SENSITIVE Sensitive     VANCOMYCIN 1 SENSITIVE Sensitive     TRIMETH/SULFA <=10 SENSITIVE Sensitive     CLINDAMYCIN <=0.25 SENSITIVE Sensitive     RIFAMPIN <=0.5 SENSITIVE Sensitive     Inducible Clindamycin NEGATIVE Sensitive     * RARE STAPHYLOCOCCUS AUREUS  Respiratory Panel by PCR     Status: Abnormal   Collection Time: 07/20/19  8:31 AM   Specimen: Nasopharyngeal Swab; Respiratory  Result Value Ref Range Status   Adenovirus NOT DETECTED NOT DETECTED Final   Coronavirus 229E NOT DETECTED NOT DETECTED Final    Comment: (NOTE) The Coronavirus on the Respiratory Panel, DOES NOT test for the novel  Coronavirus (2019 nCoV)    Coronavirus HKU1 NOT DETECTED NOT DETECTED Final   Coronavirus NL63 NOT DETECTED NOT DETECTED Final   Coronavirus OC43 NOT DETECTED NOT DETECTED Final   Metapneumovirus NOT DETECTED NOT DETECTED Final   Rhinovirus / Enterovirus DETECTED (A) NOT DETECTED Final  Influenza A NOT DETECTED NOT DETECTED Final   Influenza B NOT DETECTED NOT DETECTED Final   Parainfluenza Virus 1 NOT DETECTED  NOT DETECTED Final   Parainfluenza Virus 2 NOT DETECTED NOT DETECTED Final   Parainfluenza Virus 3 NOT DETECTED NOT DETECTED Final   Parainfluenza Virus 4 NOT DETECTED NOT DETECTED Final   Respiratory Syncytial Virus NOT DETECTED NOT DETECTED Final   Bordetella pertussis NOT DETECTED NOT DETECTED Final   Chlamydophila pneumoniae NOT DETECTED NOT DETECTED Final   Mycoplasma pneumoniae NOT DETECTED NOT DETECTED Final    Comment: Performed at [facility], [place] [address], [place], [place] [address]     Labs: BNP (last 3 results) Recent Labs    07/20/19 0356  BNP 509.3*   Basic Metabolic Panel: Recent Labs  Lab 07/20/19 1840 07/21/19 0230  07/22/19 0235 07/23/19 0230 07/24/19 0351 07/25/19 0402 07/26/19 0352  NA  --  137   < > 141 143 140 139 137  K  --  3.4*   < > 4.0 4.1 4.3 4.1 4.1  CL  --  107   < > 113* 111 106 104 103  CO2  --  20*   < > '22 26 26 28 26  '$ GLUCOSE  --  188*   < > 187* 173* 167* 201* 214*  BUN  --  30*   < > 28* '21 18 21 21  '$ CREATININE  --  1.02   < > 0.71 0.69 0.64 0.69 0.73  CALCIUM  --  7.4*   < > 7.8* 8.4* 9.3 8.9 8.8*  MG 1.6* 1.8  --   --  2.0  --   --   --   PHOS 3.0 1.4*  --   --  2.5  --   --   --    < > = values in this interval not displayed.   Liver Function Tests: Recent Labs  Lab 07/19/19 2110 07/21/19 0230 07/23/19 0230 07/24/19 0351 07/26/19 0352  AST 98* 53* '22 21 23  '$ ALT 69* 82* 48* 36 35  ALKPHOS 172* 93 105 101 83  BILITOT 1.1 0.8 0.2* 0.4 0.4  PROT 7.7 5.7* 6.1* 6.7 6.3*  ALBUMIN 3.2* 2.0* 2.1* 2.2* 2.3*   No results for input(s): LIPASE, AMYLASE in the last 168 hours. No results for input(s): AMMONIA in the last 168 hours. CBC: Recent Labs  Lab 07/19/19 2110  07/22/19 0235 07/23/19 0230 07/24/19 0351 07/25/19 0402 07/26/19 0352  WBC 5.0   < > 22.0* 17.1* 15.1* 12.6* 10.6*  NEUTROABS 4.8  --   --   --   --   --   --   HGB 13.7   < > 10.8* 10.9* 11.5* 11.4* 11.5*  HCT 43.2   < > 32.5* 34.4* 34.5* 35.6* 35.3*   MCV 95.6   < > 92.9 94.2 92.7 93.4 93.4  PLT 209   < > 173 210 209 246 235   < > = values in this interval not displayed.   Cardiac Enzymes: No results for input(s): CKTOTAL, CKMB, CKMBINDEX, TROPONINI in the last 168 hours. BNP: Invalid input(s): POCBNP CBG: Recent Labs  Lab 07/25/19 2002 07/25/19 2355 07/26/19 0408 07/26/19 0756 07/26/19 1208  GLUCAP 155* 200* 208* 171* 138*   D-Dimer No results for input(s): DDIMER in the last 72 hours. Hgb A1c No results for input(s): HGBA1C in the last 72 hours. Lipid Profile No results for input(s): CHOL, HDL, LDLCALC, TRIG, CHOLHDL, LDLDIRECT  in the last 72 hours. Thyroid function studies No results for input(s): TSH, T4TOTAL, T3FREE, THYROIDAB in the last 72 hours.  Invalid input(s): FREET3 Anemia work up No results for input(s): VITAMINB12, FOLATE, FERRITIN, TIBC, IRON, RETICCTPCT in the last 72 hours. Urinalysis    Component Value Date/Time   COLORURINE YELLOW 07/19/2019 2110   APPEARANCEUR CLOUDY (A) 07/19/2019 2110   LABSPEC 1.012 07/19/2019 2110   PHURINE 5.0 07/19/2019 2110   GLUCOSEU NEGATIVE 07/19/2019 2110   HGBUR LARGE (A) 07/19/2019 2110   BILIRUBINUR NEGATIVE 07/19/2019 2110   KETONESUR NEGATIVE 07/19/2019 2110   PROTEINUR 100 (A) 07/19/2019 2110   UROBILINOGEN 0.2 01/20/2008 1222   NITRITE NEGATIVE 07/19/2019 2110   LEUKOCYTESUR LARGE (A) 07/19/2019 2110   Sepsis Labs Invalid input(s): PROCALCITONIN,  WBC,  LACTICIDVEN Microbiology Recent Results (from the past 240 hour(s))  Blood Culture (routine x 2)     Status: Abnormal   Collection Time: 07/19/19  8:46 PM   Specimen: BLOOD LEFT HAND  Result Value Ref Range Status   Specimen Description BLOOD LEFT HAND  Final   Special Requests   Final    BOTTLES DRAWN AEROBIC AND ANAEROBIC Blood Culture results may not be optimal due to an inadequate volume of blood received in culture bottles   Culture  Setup Time   Final    IN BOTH AEROBIC AND ANAEROBIC BOTTLES  GRAM NEGATIVE RODS CRITICAL RESULT CALLED TO, READ BACK BY AND VERIFIED WITH: M Farwell 07/20/19 2201 JDW Performed at [facility], [place] [address], [place], [place] [address]    Culture KLEBSIELLA PNEUMONIAE (A)  Final   Report Status 07/23/2019 FINAL  Final   Organism ID, Bacteria KLEBSIELLA PNEUMONIAE  Final      Susceptibility   Klebsiella pneumoniae - MIC*    AMPICILLIN >=32 RESISTANT Resistant     CEFAZOLIN <=4 SENSITIVE Sensitive     CEFEPIME <=1 SENSITIVE Sensitive     CEFTAZIDIME <=1 SENSITIVE Sensitive     CEFTRIAXONE <=1 SENSITIVE Sensitive     CIPROFLOXACIN <=0.25 SENSITIVE Sensitive     GENTAMICIN <=1 SENSITIVE Sensitive     IMIPENEM <=0.25 SENSITIVE Sensitive     TRIMETH/SULFA <=20 SENSITIVE Sensitive     AMPICILLIN/SULBACTAM 4 SENSITIVE Sensitive     PIP/TAZO <=4 SENSITIVE Sensitive     * KLEBSIELLA PNEUMONIAE  Blood Culture ID Panel (Reflexed)     Status: Abnormal   Collection Time: 07/19/19  8:46 PM  Result Value Ref Range Status   Enterococcus species NOT DETECTED NOT DETECTED Final   Listeria monocytogenes NOT DETECTED NOT DETECTED Final   Staphylococcus species NOT DETECTED NOT DETECTED Final   Staphylococcus aureus (BCID) NOT DETECTED NOT DETECTED Final   Streptococcus species NOT DETECTED NOT DETECTED Final   Streptococcus agalactiae NOT DETECTED NOT DETECTED Final   Streptococcus pneumoniae NOT DETECTED NOT DETECTED Final   Streptococcus pyogenes NOT DETECTED NOT DETECTED Final   Acinetobacter baumannii NOT DETECTED NOT DETECTED Final   Enterobacteriaceae species DETECTED (A) NOT DETECTED Final    Comment: Enterobacteriaceae represent a large family of gram-negative bacteria, not a single organism. CRITICAL RESULT CALLED TO, READ BACK BY AND VERIFIED WITH: Hughie Closs [facility] 07/20/19 2201 JDW    Enterobacter cloacae complex NOT DETECTED NOT DETECTED Final   Escherichia coli NOT DETECTED NOT DETECTED Final   Klebsiella oxytoca NOT DETECTED NOT  DETECTED Final   Klebsiella pneumoniae DETECTED (A) NOT DETECTED Final    Comment: CRITICAL RESULT CALLED TO, READ BACK  BY AND VERIFIED WITH: Hughie Closs [facility] 07/20/19 2201 JDW    Proteus species NOT DETECTED NOT DETECTED Final   Serratia marcescens NOT DETECTED NOT DETECTED Final   Carbapenem resistance NOT DETECTED NOT DETECTED Final   Haemophilus influenzae NOT DETECTED NOT DETECTED Final   Neisseria meningitidis NOT DETECTED NOT DETECTED Final   Pseudomonas aeruginosa NOT DETECTED NOT DETECTED Final   Candida albicans NOT DETECTED NOT DETECTED Final   Candida glabrata NOT DETECTED NOT DETECTED Final   Candida krusei NOT DETECTED NOT DETECTED Final   Candida parapsilosis NOT DETECTED NOT DETECTED Final   Candida tropicalis NOT DETECTED NOT DETECTED Final    Comment: Performed at [facility], [place] [address] [place], [place] [address]  Urine culture     Status: Abnormal   Collection Time: 07/19/19  9:10 PM   Specimen: In/Out Cath Urine  Result Value Ref Range Status   Specimen Description IN/OUT CATH URINE  Final   Special Requests   Final    NONE Performed at [facility], [place] [address] [place], [place] [address]    Culture >=100,000 COLONIES/mL KLEBSIELLA PNEUMONIAE (A)  Final   Report Status 07/21/2019 FINAL  Final   Organism ID, Bacteria KLEBSIELLA PNEUMONIAE (A)  Final      Susceptibility   Klebsiella pneumoniae - MIC*    AMPICILLIN >=32 RESISTANT Resistant     CEFAZOLIN <=4 SENSITIVE Sensitive     CEFTRIAXONE <=1 SENSITIVE Sensitive     CIPROFLOXACIN <=0.25 SENSITIVE Sensitive     GENTAMICIN <=1 SENSITIVE Sensitive     IMIPENEM <=0.25 SENSITIVE Sensitive     NITROFURANTOIN 128 RESISTANT Resistant     TRIMETH/SULFA <=20 SENSITIVE Sensitive     AMPICILLIN/SULBACTAM 8 SENSITIVE Sensitive     PIP/TAZO <=4 SENSITIVE Sensitive     Extended ESBL NEGATIVE Sensitive     * >=100,000 COLONIES/mL KLEBSIELLA PNEUMONIAE  SARS Coronavirus 2 [facility] order, Performed  in [place] hospital lab) Nasopharyngeal     Status: None   Collection Time: 07/19/19  9:10 PM   Specimen: Nasopharyngeal  Result Value Ref Range Status   SARS Coronavirus 2 NEGATIVE NEGATIVE Final    Comment: (NOTE) If result is NEGATIVE SARS-CoV-2 target nucleic acids are NOT DETECTED. The SARS-CoV-2 RNA is generally detectable in upper and lower  respiratory specimens during the acute phase of infection. The lowest  concentration of SARS-CoV-2 viral copies this assay can detect is 250  copies / mL. A negative result does not preclude SARS-CoV-2 infection  and should not be used as the sole basis for treatment or other  patient management decisions.  A negative result may occur with  improper specimen collection / handling, submission of specimen other  than nasopharyngeal swab, presence of viral mutation(s) within the  areas targeted by this assay, and inadequate number of viral copies  (<250 copies / mL). A negative result must be combined with clinical  observations, patient history, and epidemiological information. If result is POSITIVE SARS-CoV-2 target nucleic acids are DETECTED. The SARS-CoV-2 RNA is generally detectable in upper and lower  respiratory specimens dur ing the acute phase of infection.  Positive  results are indicative of active infection with SARS-CoV-2.  Clinical  correlation with patient history and other diagnostic information is  necessary to determine patient infection status.  Positive results do  not rule out bacterial infection or co-infection with other viruses. If result is PRESUMPTIVE POSTIVE SARS-CoV-2 nucleic acids MAY BE PRESENT.   A presumptive  positive result was obtained on the submitted specimen  and confirmed on repeat testing.  While 2019 novel coronavirus  (SARS-CoV-2) nucleic acids may be present in the submitted sample  additional confirmatory testing may be necessary for epidemiological  and / or clinical management purposes  to  differentiate between  SARS-CoV-2 and other Sarbecovirus currently known to infect humans.  If clinically indicated additional testing with an alternate test  methodology [phone]) is advised. The SARS-CoV-2 RNA is generally  detectable in upper and lower respiratory sp ecimens during the acute  phase of infection. The expected result is Negative. Fact Sheet for Patients:  StrictlyIdeas.no Fact Sheet for Healthcare Providers: BankingDealers.co.za This test is not yet approved or cleared by the [place] FDA and has been authorized for detection and/or diagnosis of SARS-CoV-2 by FDA under an Emergency Use Authorization (EUA).  This EUA will remain in effect (meaning this test can be used) for the duration of the COVID-19 declaration under Section 564(b)(1) of the Act, 21 U.S.C. section 360bbb-3(b)(1), unless the authorization is terminated or revoked sooner. Performed at [facility], [place] [address] [place], [place] [address]   Blood Culture (routine x 2)     Status: None   Collection Time: 07/19/19 10:20 PM   Specimen: BLOOD  Result Value Ref Range Status   Specimen Description BLOOD BLOOD LEFT FOREARM  Final   Special Requests   Final    BOTTLES DRAWN AEROBIC AND ANAEROBIC Blood Culture adequate volume   Culture   Final    NO GROWTH 5 DAYS Performed at [facility], [place] [address] [place], [place] [address]    Report Status 07/25/2019 FINAL  Final  MRSA PCR Screening     Status: None   Collection Time: 07/20/19  3:58 AM  Result Value Ref Range Status   MRSA by PCR NEGATIVE NEGATIVE Final    Comment:        The GeneXpert MRSA Assay (FDA approved for NASAL specimens only), is one component of a comprehensive MRSA colonization surveillance program. It is not intended to diagnose MRSA infection nor to guide or monitor treatment for MRSA infections. Performed at [facility], [place] [address]  [place], [place] [address]   Culture, respiratory (non-expectorated)     Status: None   Collection Time: 07/20/19  5:29 AM   Specimen: Tracheal Aspirate; Respiratory  Result Value Ref Range Status   Specimen Description TRACHEAL ASPIRATE  Final   Special Requests NONE  Final   Gram Stain   Final    ABUNDANT WBC PRESENT, PREDOMINANTLY PMN RARE GRAM POSITIVE COCCI Performed at [facility], [place] [address], [place], [place] [address]    Culture RARE STAPHYLOCOCCUS AUREUS  Final   Report Status 07/22/2019 FINAL  Final   Organism ID, Bacteria STAPHYLOCOCCUS AUREUS  Final      Susceptibility   Staphylococcus aureus - MIC*    CIPROFLOXACIN <=0.5 SENSITIVE Sensitive     ERYTHROMYCIN <=0.25 SENSITIVE Sensitive     GENTAMICIN <=0.5 SENSITIVE Sensitive     OXACILLIN 0.5 SENSITIVE Sensitive     TETRACYCLINE <=1 SENSITIVE Sensitive     VANCOMYCIN 1 SENSITIVE Sensitive     TRIMETH/SULFA <=10 SENSITIVE Sensitive     CLINDAMYCIN <=0.25 SENSITIVE Sensitive     RIFAMPIN <=0.5 SENSITIVE Sensitive     Inducible Clindamycin NEGATIVE Sensitive     * RARE STAPHYLOCOCCUS AUREUS  Respiratory Panel by PCR     Status: Abnormal  Collection Time: 07/20/19  8:31 AM   Specimen: Nasopharyngeal Swab; Respiratory  Result Value Ref Range Status   Adenovirus NOT DETECTED NOT DETECTED Final   Coronavirus 229E NOT DETECTED NOT DETECTED Final    Comment: (NOTE) The Coronavirus on the Respiratory Panel, DOES NOT test for the novel  Coronavirus (2019 nCoV)    Coronavirus HKU1 NOT DETECTED NOT DETECTED Final   Coronavirus NL63 NOT DETECTED NOT DETECTED Final   Coronavirus OC43 NOT DETECTED NOT DETECTED Final   Metapneumovirus NOT DETECTED NOT DETECTED Final   Rhinovirus / Enterovirus DETECTED (A) NOT DETECTED Final   Influenza A NOT DETECTED NOT DETECTED Final   Influenza B NOT DETECTED NOT DETECTED Final   Parainfluenza Virus 1 NOT DETECTED NOT DETECTED Final   Parainfluenza Virus 2 NOT DETECTED NOT DETECTED  Final   Parainfluenza Virus 3 NOT DETECTED NOT DETECTED Final   Parainfluenza Virus 4 NOT DETECTED NOT DETECTED Final   Respiratory Syncytial Virus NOT DETECTED NOT DETECTED Final   Bordetella pertussis NOT DETECTED NOT DETECTED Final   Chlamydophila pneumoniae NOT DETECTED NOT DETECTED Final   Mycoplasma pneumoniae NOT DETECTED NOT DETECTED Final    Comment: Performed at [facility], [place]. [address] [place], [place] [address]     Time coordinating discharge: 40 minutes  SIGNED:   Elmarie Shiley, MD  [facility]

## 2019-07-26 NOTE — Progress Notes (Signed)
Pt was transferred to [id number] via bed. Report was given to [facility].

## 2019-07-26 NOTE — Progress Notes (Signed)
Pt is wearing CPAP off and on

## 2019-07-26 NOTE — Progress Notes (Signed)
Inpatient Rehabilitation-Admissions Coordinator   I have received insurance approval and medical clearance from Dr. Tyrell Antonio for admit to CIR today. Pt and wife aware and are in agreement for CIR today. I have received insurance letter and pt signed consent forms. Pt, wife, RN, CM/SW updated on plan for admit to CIR today.   Please call if questions.   Jhonnie Garner, OTR/L  Rehab Admissions Coordinator  [phone] 07/26/2019 5:07 PM

## 2019-07-26 NOTE — Progress Notes (Signed)
Inpatient Rehabilitation  Patient information reviewed and entered into eRehab system by Nishi Neiswonger M. Areebah Meinders, M.A., CCC/SLP, PPS Coordinator.  Information including medical coding, functional ability and quality indicators will be reviewed and updated through discharge.    

## 2019-07-27 ENCOUNTER — Inpatient Hospital Stay (HOSPITAL_COMMUNITY): Payer: Medicare Other

## 2019-07-27 ENCOUNTER — Inpatient Hospital Stay (HOSPITAL_COMMUNITY): Payer: Medicare Other | Admitting: Speech Pathology

## 2019-07-27 ENCOUNTER — Inpatient Hospital Stay (HOSPITAL_COMMUNITY): Payer: BC Managed Care – PPO

## 2019-07-27 ENCOUNTER — Inpatient Hospital Stay (HOSPITAL_COMMUNITY): Payer: Medicare Other | Admitting: Occupational Therapy

## 2019-07-27 DIAGNOSIS — E1365 Other specified diabetes mellitus with hyperglycemia: Secondary | ICD-10-CM

## 2019-07-27 DIAGNOSIS — E1165 Type 2 diabetes mellitus with hyperglycemia: Secondary | ICD-10-CM

## 2019-07-27 DIAGNOSIS — R339 Retention of urine, unspecified: Secondary | ICD-10-CM

## 2019-07-27 DIAGNOSIS — I69391 Dysphagia following cerebral infarction: Secondary | ICD-10-CM

## 2019-07-27 DIAGNOSIS — I639 Cerebral infarction, unspecified: Secondary | ICD-10-CM

## 2019-07-27 DIAGNOSIS — I10 Essential (primary) hypertension: Secondary | ICD-10-CM

## 2019-07-27 DIAGNOSIS — D638 Anemia in other chronic diseases classified elsewhere: Secondary | ICD-10-CM

## 2019-07-27 DIAGNOSIS — E1322 Other specified diabetes mellitus with diabetic chronic kidney disease: Secondary | ICD-10-CM

## 2019-07-27 DIAGNOSIS — N183 Chronic kidney disease, stage 3 unspecified: Secondary | ICD-10-CM

## 2019-07-27 DIAGNOSIS — D72829 Elevated white blood cell count, unspecified: Secondary | ICD-10-CM

## 2019-07-27 LAB — COMPREHENSIVE METABOLIC PANEL
ALT: 35 U/L (ref 0–44)
AST: 22 U/L (ref 15–41)
Albumin: 2.4 g/dL — ABNORMAL LOW (ref 3.5–5.0)
Alkaline Phosphatase: 79 U/L (ref 38–126)
Anion gap: 8 (ref 5–15)
BUN: 16 mg/dL (ref 8–23)
CO2: 28 mmol/L (ref 22–32)
Calcium: 8.8 mg/dL — ABNORMAL LOW (ref 8.9–10.3)
Chloride: 101 mmol/L (ref 98–111)
Creatinine, Ser: 0.58 mg/dL — ABNORMAL LOW (ref 0.61–1.24)
GFR calc Af Amer: >60 mL/min (ref >60)
GFR calc non Af Amer: >60 mL/min (ref >60)
Glucose, Bld: 199 mg/dL — ABNORMAL HIGH (ref 70–99)
Potassium: 4.3 mmol/L (ref 3.5–5.1)
Sodium: 137 mmol/L (ref 135–145)
Total Bilirubin: 0.6 mg/dL (ref 0.3–1.2)
Total Protein: 6.6 g/dL (ref 6.5–8.1)

## 2019-07-27 LAB — CBC WITH DIFFERENTIAL/PLATELET
Abs Immature Granulocytes: 0.81 10*3/uL — ABNORMAL HIGH (ref 0.00–0.07)
Basophils Absolute: 0.1 10*3/uL (ref 0.0–0.1)
Basophils Relative: 1 %
Eosinophils Absolute: 0.1 10*3/uL (ref 0.0–0.5)
Eosinophils Relative: 1 %
HCT: 36.1 % — ABNORMAL LOW (ref 39.0–52.0)
Hemoglobin: 11.6 g/dL — ABNORMAL LOW (ref 13.0–17.0)
Immature Granulocytes: 7 %
Lymphocytes Relative: 35 %
Lymphs Abs: 4 10*3/uL (ref 0.7–4.0)
MCH: 29.8 pg (ref 26.0–34.0)
MCHC: 32.1 g/dL (ref 30.0–36.0)
MCV: 92.8 fL (ref 80.0–100.0)
Monocytes Absolute: 0.8 10*3/uL (ref 0.1–1.0)
Monocytes Relative: 7 %
Neutro Abs: 5.8 10*3/uL (ref 1.7–7.7)
Neutrophils Relative %: 49 %
Platelets: 286 10*3/uL (ref 150–400)
RBC: 3.89 MIL/uL — ABNORMAL LOW (ref 4.22–5.81)
RDW: 12.4 % (ref 11.5–15.5)
WBC: 11.6 10*3/uL — ABNORMAL HIGH (ref 4.0–10.5)
nRBC: 0 % (ref 0.0–0.2)

## 2019-07-27 LAB — GLUCOSE, CAPILLARY
Glucose-Capillary: 112 mg/dL — ABNORMAL HIGH (ref 70–99)
Glucose-Capillary: 141 mg/dL — ABNORMAL HIGH (ref 70–99)
Glucose-Capillary: 152 mg/dL — ABNORMAL HIGH (ref 70–99)
Glucose-Capillary: 175 mg/dL — ABNORMAL HIGH (ref 70–99)
Glucose-Capillary: 182 mg/dL — ABNORMAL HIGH (ref 70–99)

## 2019-07-27 MED ORDER — OSMOLITE 1.5 CAL PO LIQD
237.0000 mL | Freq: Every day | ORAL | Status: DC
  Administered 2019-07-27 – 2019-07-31 (×19): 237 mL
  Filled 2019-07-27 (×17): qty 237

## 2019-07-27 MED ORDER — PRO-STAT SUGAR FREE PO LIQD
30.0000 mL | Freq: Every day | ORAL | Status: DC
  Administered 2019-07-27 – 2019-08-02 (×30): 30 mL
  Filled 2019-07-27 (×30): qty 30

## 2019-07-27 NOTE — Progress Notes (Signed)
Initial Nutrition Assessment  DOCUMENTATION CODES:   Morbid obesity  INTERVENTION:   Bolus tube feeding regimen: - Osmolite 1.5 cal 1 carton/ARC 5 x daily - Pro-stat 30 ml 5 x daily - Free water per MD/PA, currently 200 ml q 8 hours  Tube feeding regimen and current free water provides 2278 kcal, 149 grams of protein, and 1503 ml of H2O.    NUTRITION DIAGNOSIS:   Inadequate oral intake related to inability to eat as evidenced by NPO status.  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 Pt underwent comprehensive rehab 9/08-9/28 due to functional decline. Pt tolerated decannulation by 9/22. He was discharged to home. Pt was readmitted on 07/20/19 with dyspnea with increased WOB requiring intubation and was found to have septic shock with hypotension and AKI. Pt was treated with broad spectrum antibiotics with IVF and found to have Klebsiella pneumoniae bacteremia with UTI and bilateral lung infiltrates due to rhino virus. Pt tolerated extubation by 10/02. Pt admitted to CIR on 10/07.   Spoke with PA who agreed with transitioning pt to bolus TF regimen. Discussed plan with RN.  Spoke with pt at bedside. Pt denies any abdominal pain at this time. Pt states that he rarely feels nauseous but occasionally does after he "belches." Pt aware of plan to transition back to bolus TF.  Reviewed weight history in chart. Pt with a 5.2 kg weight loss over the last 1 month. This is a  3.7% weight loss which is not quite significant for timeframe.  Current TF: Osmolite 1.5 @ 45 ml/hr, Pro-stat 60 ml TID which provides 2220 kcal, 158 grams of protein, and 823 ml free water. Free water flushes of 200 ml q 8 hours ordered.  Medications reviewed and include: SSI q 4 hours  Labs reviewed. CBG's: 128-157 x 24 hours  NUTRITION - FOCUSED PHYSICAL EXAM:    Most Recent Value  Orbital Region  No depletion  Upper Arm Region  No depletion  Thoracic and Lumbar Region  No depletion  Buccal Region  No depletion  Temple Region  No depletion  Clavicle Bone Region  No depletion  Clavicle and Acromion Bone Region  Mild depletion  Scapular Bone Region  No depletion  Dorsal Hand  No depletion  Patellar Region  No depletion  Anterior Thigh Region  No depletion  Posterior Calf Region  No depletion  Edema (RD Assessment)  Mild [BLE]  Hair  Reviewed  Eyes  Reviewed  Mouth  Reviewed  Skin  Reviewed  Nails  Reviewed       Diet Order:   Diet Order            Diet NPO time specified Except for: Ice Chips  Diet effective now              EDUCATION NEEDS:   No education needs have been identified at this time  Skin:  Skin Assessment: Reviewed RN Assessment (MASD to groin)  Last BM:  07/25/19  Height:   Ht Readings from Last  1 Encounters:  07/26/19 6' (1.829 m)    Weight:   Wt Readings from Last 1 Encounters:  07/27/19 (!) 136.5 kg    Ideal Body Weight:  80.9 kg  BMI:  Body mass index is 40.81 kg/m.  Estimated Nutritional Needs:   Kcal:  2200-2400  Protein:  130-150 grams  Fluid:  >/= 2.0 L    Earma Reading, MS, RD, LDN Inpatient Clinical Dietitian Pager: [phone] Weekend/After Hours: [phone]

## 2019-07-27 NOTE — Evaluation (Signed)
Occupational Therapy Assessment and Plan  Patient Details  Name: Michael Bright MRN: [id number] Date of Birth: 07-27-1956  OT Diagnosis: acute pain, cognitive deficits, hemiplegia affecting non-dominant side and muscle weakness (generalized) Rehab Potential: Rehab Potential (ACUTE ONLY): Good ELOS: ~2 weeks   Today's Date: 07/27/2019 OT Individual Time: 1350-1450 OT Individual Time Calculation (min): 60 min     Problem List:  Patient Active Problem List   Diagnosis Date Noted  . Morbidly obese ([place])   . Urinary retention   . Leukocytosis   . Anemia of chronic disease   . Controlled type 2 diabetes mellitus with hyperglycemia ([place])   . Septic shock ([place]) 07/20/2019  . Sepsis with acute renal failure without septic shock ([place])   . Urinary tract infection without hematuria   . Acute blood loss anemia   . Dysphagia due to recent cerebral infarction   . Diabetes mellitus type 2 in obese ([place])   . Morbid obesity ([place])   . Status post insertion of percutaneous endoscopic gastrostomy (PEG) tube ([place])   . Stroke, small vessel ([place]) 06/27/2019  . Aspiration pneumonia ([place]) 06/14/2019  . Essential hypertension 06/14/2019  . Type 2 diabetes mellitus with hyperlipidemia ([place]) 06/14/2019  . Acute respiratory failure ([place])   . Acute CVA (cerebrovascular accident) ([place]) 05/30/2019  . 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 Dysphagia 05/30/2019  . Renal insufficiency 05/30/2019  . Diplopia   . Debility     Past Medical History:  Past Medical History:  Diagnosis Date  . Acute kidney injury ([place])   . Diabetes ([place])   . Pneumonia    aspiration pneumonia  . Septic shock ([place])   . Stroke ([place])   . Urinary tract infection    Past Surgical History:  Past Surgical History:  Procedure Laterality Date  . CHOLECYSTECTOMY    . IR GASTROSTOMY TUBE MOD SED  07/04/2019    Assessment & Plan Clinical Impression: Patient is a 63 y.o. year old  male history of T2DM, OSA, morbid obesity, recent admission for left medullary stroke 74/94/49 complicated by VDRF due to aspiration and MSSA pneumonia requiring tracheostomy as well as prolonged course antibiotics, severe dysphagia--PEG placed on 9/15, urinary retention requiring I/O caths and underwent comprehensive rehab 09/08-09/28/20 due to functional decline. He tolerated decannulation by 09/22 and oral secretions were being managed with use of robinul and guaifenesin DM. He was discharged to home NPO with CGA for mobility due to dizziness and ataxia. He was readmitted on 07/20/19 with dyspnea with increase WOB requiring intubation and was found to have septic shock with hypotension and AKI. He was treated with broad spectrum antibiotics with IVF and found to have Klebsiella pneumoniae bacteremia with UTI and bilateral lung infiltrates due to rhino virus. He tolerated extubation by 10/2 and cefazolin narrowed to cefdinir on 10/05. Intermittent hypoxic episodes resolving and acute respiratory failure felt to bed in secondary to rhino virus in setting of sever dysphagia. Abnormal LFTs due to shocked liver as well as AKI have resolved. Therapy evaluation done revealing debility with dysphonia and increased secretions. CIR recommended due to functional deficits.  Patient transferred to CIR on 07/26/2019 .    Patient currently requires mod to max A  with basic self-care skills and basic mobility  secondary to muscle weakness and acute pain, decreased cardiorespiratoy endurance, unbalanced muscle activation, motor apraxia and decreased coordination, decreased attention, decreased awareness, decreased problem solving, decreased safety awareness, decreased memory and delayed processing and  decreased standing balance, hemiplegia and decreased balance strategies.  Prior to hospitalization, patient could complete ADLs with supervision.  Patient will benefit from skilled intervention to decrease level of assist with  basic self-care skills and increase independence with basic self-care skills prior to discharge home with care partner.  Anticipate patient will require intermittent supervision and contact guard and follow up home health.  OT - End of Session Activity Tolerance: Tolerates < 10 min activity, no significant change in vital signs Endurance Deficit: Yes OT Assessment Rehab Potential (ACUTE ONLY): Good OT Patient demonstrates impairments in the following area(s): Balance;Cognition;Edema;Endurance;Motor;Pain;Perception;Safety OT Basic ADL's Functional Problem(s): Grooming;Bathing;Dressing OT Transfers Functional Problem(s): Toilet;Tub/Shower OT Additional Impairment(s): Fuctional Use of Upper Extremity OT Plan OT Intensity: Minimum of 1-2 x/day, 45 to 90 minutes OT Frequency: 5 out of 7 days OT Duration/Estimated Length of Stay: ~2 weeks OT Treatment/Interventions: Balance/vestibular training;Discharge planning;Pain management;Self Care/advanced ADL retraining;Therapeutic Activities;UE/LE Coordination activities;Visual/perceptual remediation/compensation;Therapeutic Exercise;Skin care/wound managment;Patient/family education;Functional mobility training;Disease mangement/prevention;Cognitive remediation/compensation;Community reintegration;DME/adaptive equipment instruction;Neuromuscular re-education;Psychosocial support;UE/LE Strength taining/ROM;Wheelchair propulsion/positioning OT Self Feeding Anticipated Outcome(s): n/a OT Basic Self-Care Anticipated Outcome(s): contact guard OT Toileting Anticipated Outcome(s): contact guard OT Bathroom Transfers Anticipated Outcome(s): contact guard OT Recommendation Recommendations for Other Services: Speech consult Patient destination: Home Follow Up Recommendations: Home health OT Equipment Recommended: To be determined   Skilled Therapeutic Intervention OT eval  With Ot goals, purpose and purpose discussed. Pt is known to this therapist from previous  admission. Pt sounded very wet but unable to clear secretions. Pt's pants were also wet from trying to use the urinal. Pt perform sit to stands with mod A into STEDY to transition into the shower to shower chair. Pt able to bathe with mod A overall requiring A for peri area, buttocks, lower legs and back. Pt still with significant pain in left Ue and with difficulty with shoulder flexion. Pt did spray himself in the face and swallowed water after specific instructions about use of hand held shower and getting his face wet. Pt able to thread pants but required mod A to stand and then assistance to pull them up. Pt don hospital gown and total A for footwear. Left sitting up in the chair   OT Evaluation Precautions/Restrictions  Precautions Precautions: Fall Precaution Comments: PEG; NPO Other Brace: L AFO Restrictions Weight Bearing Restrictions: No General Chart Reviewed: Yes Family/Caregiver Present: No   Pain Pain Assessment Pain Scale: 0-10 Pain Score: 10-Worst pain ever Faces Pain Scale: Hurts worst Pain Type: Acute pain Pain Location: Shoulder Pain Orientation: Left Pain Descriptors / Indicators: Aching;Discomfort Pain Frequency: Rarely Pain Onset: Gradual Multiple Pain Sites: No Home Living/Prior Functioning Home Living Family/patient expects to be discharged to:: Private residence Living Arrangements: Spouse/significant other Available Help at Discharge: Family, Available 24 hours/day Type of Home: House Home Access: Ramped entrance Entrance Stairs-Number of Steps: 4 stairs Home Layout: Two level, Able to live on main level with bedroom/bathroom Alternate Level Stairs-Number of Steps: basement, but does not go in [employer] Shower/Tub: Tub/shower unit  Lives With: Spouse Prior Function Level of Independence: Requires assistive device for independence, Needs assistance with ADLs(CGA overall with RW)  Able to Take Stairs?: Yes Driving: No Comments: was home for two days  walking with RW on his own, assist for socks, shoes, showering with HHshower and tub bench ADL ADL Eating: NPO Grooming: Moderate assistance Where Assessed-Grooming: Wheelchair Upper Body Bathing: Minimal assistance Where Assessed-Upper Body Bathing: Shower Lower Body Bathing: Maximal assistance Where Assessed-Lower Body Bathing: Shower Upper Body Dressing: Minimal  assistance Where Assessed-Upper Body Dressing: Sitting at sink Lower Body Dressing: Moderate assistance Where Assessed-Lower Body Dressing: Sitting at sink Toilet Transfer: Not assessed Social research officer, government: (used the [employer]) Vision Baseline Vision/History: Wears glasses Wears Glasses: Distance only Patient Visual Report: No change from baseline Additional Comments: will continue to assess Perception  Perception: Within Functional Limits Praxis Praxis: Intact Cognition Overall Cognitive Status: Impaired/Different from baseline Arousal/Alertness: Awake/alert Orientation Level: Person;Place;Situation Person: Oriented Place: Oriented Situation: Oriented Year: 2020 Month: October Day of Week: Incorrect(tuesday) Memory: Impaired Memory Impairment: Storage deficit;Decreased recall of new information Immediate Memory Recall: Sock;Blue;Bed Memory Recall Sock: With Cue Memory Recall Blue: Without Cue Memory Recall Bed: Without Cue Attention: Selective Focused Attention: Appears intact Sustained Attention: Appears intact Selective Attention: Impaired Selective Attention Impairment: Functional basic Awareness: Impaired Awareness Impairment: Emergent impairment Problem Solving: Impaired Problem Solving Impairment: Functional basic Reasoning: Appears intact Behaviors: Impulsive Safety/Judgment: Appears intact Sensation Sensation Light Touch: Appears Intact Proprioception: Appears Intact Coordination Gross Motor Movements are Fluid and Coordinated: No Fine Motor Movements are Fluid and Coordinated:  No Coordination and Movement Description: impaired in LLE/LUE due to weakness Heel Shin Test: slightly more impaired LLE Motor  Motor Motor: Abnormal postural alignment and control;Hemiplegia Motor - Skilled Clinical Observations: generalized debility; still with residual L sided weakness s/p recent meduallary CVA Mobility  Transfers Sit to Stand: Moderate Assistance - Patient 50-74% Stand to Sit: Moderate Assistance - Patient 50-74%  Trunk/Postural Assessment  Cervical Assessment Cervical Assessment: (forward head) Thoracic Assessment Thoracic Assessment: (flexed position) Lumbar Assessment Lumbar Assessment: (posterior pelvic tilt) Postural Control Righting Reactions: delayed Protective Responses: delayed  Balance Static Sitting Balance Static Sitting - Level of Assistance: 5: Stand by assistance Dynamic Sitting Balance Dynamic Sitting - Level of Assistance: 5: Stand by assistance Static Standing Balance Static Standing - Balance Support: During functional activity;Bilateral upper extremity supported Static Standing - Level of Assistance: 4: Min assist Dynamic Standing Balance Dynamic Standing - Balance Support: During functional activity;Bilateral upper extremity supported Dynamic Standing - Level of Assistance: 3: Mod assist Extremity/Trunk Assessment RUE Assessment RUE Assessment: Within Functional Limits LUE Assessment Active Range of Motion (AROM) Comments: shoulder flexion ~40 degrees, able to extend elbow WFL, grasp weak and pain in deltoid area; shoulder is weaker now then when he left here General Strength Comments: 2-/5 Brunstrum levels for arm and hand: Arm;Hand Brunstrum level for arm: Stage V Relative Independence from [employer] Brunstrum level for hand: Stage VI Isolated joint movements     Refer to Care Plan for Long Term Goals  Recommendations for other services: None    Discharge Criteria: Patient will be discharged from OT if patient refuses  treatment 3 consecutive times without medical reason, if treatment goals not met, if there is a change in medical status, if patient makes no progress towards goals or if patient is discharged from hospital.  The above assessment, treatment plan, treatment alternatives and goals were discussed and mutually agreed upon: by patient  Nicoletta Ba 07/27/2019, 3:45 PM

## 2019-07-27 NOTE — Evaluation (Signed)
Speech Language Pathology Assessment and Plan  Patient Details  Name: Michael Bright MRN: [id number] Date of Birth: 06/04/1956  SLP Diagnosis: Dysphagia;Cognitive Impairments  Rehab Potential: Fair ELOS: 2 weeks    Today's Date: 07/27/2019 SLP Individual Time: 1050-1130 SLP Individual Time Calculation (min): 40 min   Problem List:  Patient Active Problem List   Diagnosis Date Noted  . Morbidly obese ([place])   . Urinary retention   . Leukocytosis   . Anemia of chronic disease   . Controlled type 2 diabetes mellitus with hyperglycemia ([place])   . Septic shock ([place]) 07/20/2019  . Sepsis with acute renal failure without septic shock ([place])   . Urinary tract infection without hematuria   . Acute blood loss anemia   . Dysphagia due to recent cerebral infarction   . Diabetes mellitus type 2 in obese ([place])   . Morbid obesity ([place])   . Status post insertion of percutaneous endoscopic gastrostomy (PEG) tube ([place])   . Stroke, small vessel ([place]) 06/27/2019  . Aspiration pneumonia ([place]) 06/14/2019  . Essential hypertension 06/14/2019  . Type 2 diabetes mellitus with hyperlipidemia ([place]) 06/14/2019  . Acute respiratory failure ([place])   . Acute CVA (cerebrovascular accident) ([place]) 05/30/2019  . 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 Dysphagia 05/30/2019  . Renal insufficiency 05/30/2019  . Diplopia   . Debility    Past Medical History:  Past Medical History:  Diagnosis Date  . Acute kidney injury ([place])   . Diabetes ([place])   . Pneumonia    aspiration pneumonia  . Septic shock ([place])   . Stroke ([place])   . Urinary tract infection    Past Surgical History:  Past Surgical History:  Procedure Laterality Date  . CHOLECYSTECTOMY    . IR GASTROSTOMY TUBE MOD SED  07/04/2019    Assessment / Plan / Recommendation Clinical Impression Patient is a 63 year old male with history of T2DM, OSA, morbid obesity, recent admission for left medullary  stroke 76/54/65 complicated by VDRF due to aspiration and MSSA pneumonia requiring tracheostomy as well as prolonged course antibiotics, severe dysphagia--PEG placed on 9/15, urinary retention requiring I/O cathsand underwent comprehensive rehab 09/08-09/28/20 due to functional decline. He tolerated decannulation by 09/22 and oral secretions were being managed with use of robinul and guaifenesin DM. He was discharged to home NPO with CGA for mobility due to dizziness and ataxia. He was readmitted on 07/20/19 with dyspnea with increase WOB requiring intubation and was found to have septic shock with hypotension and AKI. He was treated with broad spectrum antibiotics with IVF and found to have Klebsiella pneumoniae bacteremia with UTI and bilateral lung infiltrates due to rhino virus. He tolerated extubation by 10/2 and cefazolin narrowed to cefdinir on 10/05. Intermittent hypoxic episodes resolving and acute respiratory failure felt to bed in secondary to rhino virus in setting of sever dysphagia. Abnormal LFTs due to shocked liver as well as AKI have resolved. Therapy evaluation done revealing debility and CIR recommended for follow up therapy. Pt admitted to CIR on 07/26/2019.  Patient continues to demonstrate overt s/s of a severe pharyngeal dysphagia characterized by decreased management of secretions and overt s/s of aspiration with trials of ice chips. Recommend patient remain NPO, continue pharyngeal strengthening exercises and trials with SLP. Patient also maybe benefit from RMT and vital stim to maximize swallow function. Will consult with MD.  Patient also demonstrates mild memory deficits impacting recall of functional information. Patient would benefit  from skilled SLP intervention to maximize his swallowing and cognitive functioning prior to discharge.    Skilled Therapeutic Interventions          Administered a cognitive-linguistic evaluation and BSE, please see above for details.   SLP  Assessment  Patient will need skilled [facility] during CIR admission    Recommendations  SLP Diet Recommendations: NPO;Alternative means - long-term Medication Administration: Via alternative means Oral Care Recommendations: Oral care QID Patient destination: Home Follow up Recommendations: Home Health SLP;24 hour supervision/assistance Equipment Recommended: To be determined    SLP Frequency 3 to 5 out of 7 days   SLP Duration  SLP Intensity  SLP Treatment/Interventions 2 weeks  Minumum of 1-2 x/day, 30 to 90 minutes  Cognitive remediation/compensation;Cueing hierarchy;Dysphagia/aspiration precaution training;Functional tasks;Internal/external aids;Patient/family education;Therapeutic Activities;Therapeutic Exercise    Pain No/Denies Pain   Short Term Goals: Week 1: SLP Short Term Goal 1 (Week 1): Pt will utilizie memory compensatory strategies to recall new, daily information with min A verbal cues. SLP Short Term Goal 2 (Week 1): Pt will perform throat clear and swallow to reduce wet vocal quality with min A verbal cues. SLP Short Term Goal 3 (Week 1): Pt will consume ice chip trials to preserve swallow function with moderate s/s aspiration.  Refer to Care Plan for Long Term Goals  Recommendations for other services: None   Discharge Criteria: Patient will be discharged from SLP if patient refuses treatment 3 consecutive times without medical reason, if treatment goals not met, if there is a change in medical status, if patient makes no progress towards goals or if patient is discharged from hospital.  The above assessment, treatment plan, treatment alternatives and goals were discussed and mutually agreed upon: by patient  Michael Bright 07/27/2019, 3:22 PM

## 2019-07-27 NOTE — Evaluation (Signed)
Physical Therapy Assessment and Plan  Patient Details  Name: Michael Bright MRN: [id number] Date of Birth: 06-May-1956  PT Diagnosis: Difficulty walking, Dizziness and giddiness, Edema, Hemiplegia, Muscle weakness and Pain in joint Rehab Potential: Good ELOS: 12-14 days   Today's Date: 07/27/2019 PT Individual Time: 0300-9233 PT Individual Time Calculation (min): 75 min    Problem List:  Patient Active Problem List   Diagnosis Date Noted  . Septic shock ([place]) 07/20/2019  . Sepsis with acute renal failure without septic shock ([place])   . Urinary tract infection without hematuria   . Acute blood loss anemia   . Dysphagia due to recent cerebral infarction   . Diabetes mellitus type 2 in obese ([place])   . Morbid obesity ([place])   . Status post insertion of percutaneous endoscopic gastrostomy (PEG) tube ([place])   . Stroke, small vessel ([place]) 06/27/2019  . Aspiration pneumonia ([place]) 06/14/2019  . Essential hypertension 06/14/2019  . Type 2 diabetes mellitus with hyperlipidemia ([place]) 06/14/2019  . Acute respiratory failure ([place])   . Acute CVA (cerebrovascular accident) ([place]) 05/30/2019  . 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 Dysphagia 05/30/2019  . Renal insufficiency 05/30/2019  . Diplopia   . Debility     Past Medical History:  Past Medical History:  Diagnosis Date  . Acute kidney injury ([place])   . Diabetes ([place])   . Pneumonia    aspiration pneumonia  . Septic shock ([place])   . Stroke ([place])   . Urinary tract infection    Past Surgical History:  Past Surgical History:  Procedure Laterality Date  . CHOLECYSTECTOMY    . IR GASTROSTOMY TUBE MOD SED  07/04/2019    Assessment & Plan Clinical Impression: Patient is a 63 year old male with history of T2DM, OSA, morbid obesity, recent admission for left medullary stroke 00/76/22 complicated by VDRF due to aspiration and MSSA pneumonia requiring tracheostomy as well as prolonged course  antibiotics, severe dysphagia--PEG placed on 9/15, urinary retention requiring I/O caths and underwent comprehensive rehab 09/08-09/28/20 due to functional decline. He tolerated decannulation by 09/22 and oral secretions were being managed with use of robinul and guaifenesin DM. He was discharged to home NPO with CGA for mobility due to dizziness and ataxia. He was readmitted on 07/20/19 with dyspnea with increase WOB requiring intubation and was found to have septic shock with hypotension and AKI. He was treated with broad spectrum antibiotics with IVF and found to have Klebsiella pneumoniae bacteremia with UTI and bilateral lung infiltrates due to rhino virus. He tolerated extubation by 10/2 and cefazolin narrowed to cefdinir on 10/05. Intermittent hypoxic episodes resolving and acute respiratory failure felt to bed in secondary to rhino virus in setting of sever dysphagia. Abnormal LFTs due to shocked liver as well as AKI have resolved. Therapy evaluation done revealing debility with dysphonia and increased secretions. CIR recommended due to functional deficits.   Pt is requiring a lot of oral suctioning due to excessive secretions, however feels like can keep up with in rehab, and doesn't want to wait until tomorrow to come to rehab.  Patient transferred to CIR on 07/26/2019 .   Patient currently requires mod with mobility secondary to muscle weakness and muscle joint tightness, decreased cardiorespiratoy endurance, unbalanced muscle activation and decreased coordination and decreased standing balance, decreased postural control, hemiplegia and decreased balance strategies.  Prior to hospitalization, patient was supervision with mobility and lived with Spouse in a House home.  Home  access is 4 stairsRamped entrance.  Patient will benefit from skilled PT intervention to maximize safe functional mobility, minimize fall risk and decrease caregiver burden for planned discharge home with 24 hour supervision.   Anticipate patient will benefit from follow up [place] at discharge.  PT - End of Session Activity Tolerance: Tolerates 30+ min activity with multiple rests Endurance Deficit: Yes Endurance Deficit Description: O2 > 97%; HR = 78 bpm; rest breaks due to deconditioning PT Assessment Rehab Potential (ACUTE/IP ONLY): Good PT Barriers to Discharge: Medical stability PT Patient demonstrates impairments in the following area(s): Balance;Edema;Endurance;Motor;Nutrition;Pain;Skin Integrity PT Transfers Functional Problem(s): Bed Mobility;Bed to Chair;Car;Furniture PT Locomotion Functional Problem(s): Ambulation;Wheelchair Mobility;Stairs PT Plan PT Intensity: Minimum of 1-2 x/day ,45 to 90 minutes PT Frequency: 5 out of 7 days PT Duration Estimated Length of Stay: 12-14 days PT Treatment/Interventions: Ambulation/gait training;Balance/vestibular training;Community reintegration;Discharge planning;Disease management/prevention;DME/adaptive equipment instruction;Functional mobility training;Neuromuscular re-education;Pain management;Functional electrical stimulation;Patient/family education;Psychosocial support;Skin care/wound management;Splinting/orthotics;Stair training;Therapeutic Activities;Therapeutic Exercise;UE/LE Strength taining/ROM;Wheelchair propulsion/positioning;UE/LE Coordination activities PT Transfers Anticipated Outcome(s): supervision to CGA for basic transfers PT Locomotion Anticipated Outcome(s): supervision to [place] for gait household distances; min assist stairs PT Recommendation Follow Up Recommendations: Home health PT;24 hour supervision/assistance Patient destination: Home Equipment Recommended: None recommended by PT Equipment Details: Pt already has RW and w/c from previous admission  Skilled Therapeutic Intervention Evaluation completed (see details above and below) with education on PT POC and goals and individual treatment initiated with focus on bed mobility retraining,  functional transfer training with RW, w/c mobility for functional strengthening, coordination and cardiovascular endurance training, sit <> stands, and initiation of gait training. Pt requires max assist for initial sit <> stand from EOB with multiple attempts due to posterior bias and required significantly elevated surface to perform in order to pull up pants. Verbal cues for correct hand placement and to facilitation anterior weightshift. PT assisted with donning of pants, shoes, and [place] for time management - pt able to maintain functional dynamic sitting balance during theses tasks with overall supervision and no LOB occurred. Pt utilized suction throughout due to secretions.Pre-gait assessed in standing for stability in BLE, especially L as pt difficulty initially with marching in place. With repetition able to perform stand step transfer with RW from bed to w/c with mod assist and noted decreased stability requiring sudden sit into w/c. Initiated gait with +2 assist for safety due to LLE instability with close w/c follow and assist for IV pole management (tube feeds) x 12' with min to mod assist. Practiced sit <.> stand technique from w/c with cues for hand placement and anterior weightshift requiring mod assist x 4 reps and multiple attempts needed to complete.    PT Evaluation Precautions/Restrictions Precautions Precautions: Fall Precaution Comments: PEG; NPO Required Braces or Orthoses: Other Brace Other Brace: L AFO Restrictions Weight Bearing Restrictions: No   Vital Signs Therapy Vitals BP: 129/70 Patient Position (if appropriate): Sitting  Reports mild dizziness with upright mobility but resolved with seated breaks. Pt also states he feels groggy from medication. HR = 78 bpm O2 on room air = 98% Pain  c/o pain in L shoulder - MD and RN aware.  Home Living/Prior Functioning Home Living Available Help at Discharge: Family;Available 24 hours/day Type of Home: House Home  Access: Ramped entrance Entrance Stairs-Number of Steps: 4 stairs Home Layout: Two level;Able to live on main level with bedroom/bathroom Alternate Level Stairs-Number of Steps: basement, but does not go in [employer] Shower/Tub: Tub/shower unit  Lives With: Spouse Prior Function Level  of Independence: Requires assistive device for independence;Needs assistance with ADLs(CGA overall with RW)  Able to Take Stairs?: Yes Driving: No Comments: was home for two days walking with RW on his own, assist for socks, shoes, showering with HHshower and tub bench    Cognition Overall Cognitive Status: Impaired/Different from baseline Arousal/Alertness: Awake/alert Orientation Level: Oriented X4 Focused Attention: Appears intact Sustained Attention: Appears intact Selective Attention: Appears intact Memory: Impaired Memory Impairment: Storage deficit;Decreased recall of new information Awareness: Appears intact Problem Solving: Appears intact Reasoning: Appears intact Safety/Judgment: Appears intact Sensation Sensation Light Touch: Appears Intact Coordination Gross Motor Movements are Fluid and Coordinated: No Coordination and Movement Description: impaired in LLE/LUE due to weakness Motor  Motor Motor: Abnormal postural alignment and control;Hemiplegia Motor - Skilled Clinical Observations: generalized debility; still with residual L sided weakness s/p recent meduallary CVA  Mobility Bed Mobility Bed Mobility: Rolling Left;Supine to Sit Rolling Left: Moderate Assistance - Patient 50-74% Supine to Sit: Moderate Assistance - Patient 50-74% Transfers Transfers: Sit to Stand;Stand to Sit;Stand Pivot Transfers Sit to Stand: Maximal Assistance - Patient 25-49% Stand to Sit: Moderate Assistance - Patient 50-74% Stand Pivot Transfers: Moderate Assistance - Patient 50 - 74% Locomotion  Gait Gait Distance (Feet): 12 Feet Assistive device: Rolling walker Gait Gait Pattern: Impaired Gait  Pattern: Decreased stride length;Decreased stance time - left;Shuffle;Poor foot clearance - left Stairs / Additional Locomotion Stairs: No Wheelchair Mobility Wheelchair Mobility: Yes Wheelchair Assistance: Supervision/Verbal cueing Wheelchair Propulsion: Right upper extremity;Both lower extermities Wheelchair Parts Management: Needs assistance Distance: 45'  Trunk/Postural Assessment  Cervical Assessment Cervical Assessment: (forward head) Thoracic Assessment Thoracic Assessment: (flexed posture) Lumbar Assessment Lumbar Assessment: (posterior pelvic tilt) Postural Control Postural Control: Deficits on evaluation(posterior bias in standing)  Balance Balance Balance Assessed: Yes Static Sitting Balance Static Sitting - Level of Assistance: 5: Stand by assistance Dynamic Sitting Balance Dynamic Sitting - Level of Assistance: 5: Stand by assistance Static Standing Balance Static Standing - Level of Assistance: 4: Min assist(with RW for support) Dynamic Standing Balance Dynamic Standing - Level of Assistance: 3: Mod assist(with RW for support) Extremity Assessment      RLE Assessment General Strength Comments: grossly 3+ to 4-/5 with generalized weakness and debility LLE Assessment LLE Assessment: Exceptions to [facility] General Strength Comments: grossly 3+/5; decreased functional weakness and stability in standing/gait    Refer to Care Plan for Long Term Goals  Recommendations for other services: None   Discharge Criteria: Patient will be discharged from PT if patient refuses treatment 3 consecutive times without medical reason, if treatment goals not met, if there is a change in medical status, if patient makes no progress towards goals or if patient is discharged from hospital.  The above assessment, treatment plan, treatment alternatives and goals were discussed and mutually agreed upon: by patient  Juanna Cao, PT, DPT, CBIS  07/27/2019, 9:43 AM

## 2019-07-27 NOTE — Progress Notes (Addendum)
PHYSICAL MEDICINE & REHABILITATION PROGRESS NOTE  Subjective/Complaints: Patient seen sitting up in bed this morning working with therapies.  He states he slept well overnight.  Discussed shoulder pain/taping with patient and therapies.  He notes Lidoderm patch with benefit.  ROS: Denies CP, SOB, N/V/D  Objective: Vital Signs: Blood pressure 129/70, pulse 66, temperature 97.9 F (36.6 C), resp. rate 18, height 6' (1.829 m), weight (!) 136.5 kg, SpO2 98 %. No results found. Recent Labs    07/26/19 0352 07/27/19 0651  WBC 10.6* 11.6*  HGB 11.5* 11.6*  HCT 35.3* 36.1*  PLT 235 286   Recent Labs    07/26/19 0352 07/27/19 0651  NA 137 137  K 4.1 4.3  CL 103 101  CO2 26 28  GLUCOSE 214* 199*  BUN 21 16  CREATININE 0.73 0.58*  CALCIUM 8.8* 8.8*    Physical Exam: BP 129/70 (BP Location: Left Arm)   Pulse 66   Temp 97.9 F (36.6 C)   Resp 18   Ht 6' (1.829 m)   Wt (!) 136.5 kg   SpO2 98%   BMI 40.81 kg/m  Constitutional: No distress . Vital signs reviewed.  Mildly obese. HENT: Normocephalic.  Atraumatic. Neck: Stoma closed. Eyes: EOMI. No discharge. Cardiovascular: No JVD. Respiratory: Normal effort.  No stridor. GI: Non-distended.  + PEG. Skin: See above. Psych: Normal mood.  Normal behavior. Musc: Lower extremity edema. Neuro: Alert Motor: Grossly 4+/5 throughout, except for left shoulder flexion/abduction (some limitation due to pain) Dysphonia with wet voice  Assessment/Plan: 1. Functional deficits secondary to debility which require 3+ hours per day of interdisciplinary therapy in a comprehensive inpatient rehab setting.  Physiatrist is providing close team supervision and 24 hour management of active medical problems listed below.  Physiatrist and rehab team continue to assess barriers to discharge/monitor patient progress toward functional and medical goals  Care Tool:  Bathing              Bathing assist       Upper Body  Dressing/Undressing Upper body dressing        Upper body assist      Lower Body Dressing/Undressing Lower body dressing            Lower body assist       Toileting Toileting    Toileting assist       Transfers Chair/bed transfer  Transfers assist     Chair/bed transfer assist level: Moderate Assistance - Patient 50 - 74%     Locomotion Ambulation   Ambulation assist      Assist level: 2 helpers Assistive device: Walker-rolling Max distance: 12'   Walk 10 feet activity   Assist     Assist level: 2 helpers     Walk 50 feet activity   Assist Walk 50 feet with 2 turns activity did not occur: Safety/medical concerns         Walk 150 feet activity   Assist Walk 150 feet activity did not occur: Safety/medical concerns         Walk 10 feet on uneven surface  activity   Assist Walk 10 feet on uneven surfaces activity did not occur: Safety/medical concerns         Wheelchair     Assist Will patient use wheelchair at discharge?: Yes Type of Wheelchair: Manual    Wheelchair assist level: Supervision/Verbal cueing Max wheelchair distance: 22'    Wheelchair 50 feet with 2 turns activity  Assist        Assist Level: Supervision/Verbal cueing   Wheelchair 150 feet activity     Assist Wheelchair 150 feet activity did not occur: Safety/medical concerns         Medical Problem List and Plan: 1. Debility secondary to septic shock from UTI and Klebsiella pneumonia.bacteremia with rhinovirus.  Begin CIR evaluations 2. Antithrombotics: -DVT/anticoagulation:Pharmaceutical:Heparin -antiplatelet therapy: ASA/Plavix. 3. Pain Management:tylenol prn. 4. Mood:LCSW to follow for evaluation and support. -antipsychotic agents: N/A 5. Neuropsych: This patientiscapable of making decisions on hisown behalf. 6. Skin/Wound Care:Routine pressure relief measures. 7.  Fluids/Electrolytes/Nutrition:N.p.o.  Tube feeds qid with water flushes.  8. Klebsiella PNA bacteremia/UTI: On Cefdinir IV until 08/02/2019. 9. Bilateral air space disease/Rhino virus: On Pulmicort bid,guifenesin qid (Mucinexcannot be crushed), duo nebs qid  10. Medullary stroke:Hgb A1c- 10.6.NPO due to severe dysphagia with oral secretions--resumed Robinul 1 mg tid, increased necessary.Continue NPO with tube feedson osmolyte   Consulted RD to resume bolus tube feeds.   Added water flushes 11. T2DM with hyperglycemia:Monitor BSevery 4 hours with SSI and May need to resume lantus  Monitor with increased mobility 12 HTN: Monitor BP tid--currently controlled off medications.  Monitor with increased mobility 13. OSA: Continue CPAP whenever asleep.  14. Anemia of chronic illness:   Hemoglobin 11.6 on 10/8  Continue to monitor  15.  Leukocytosis:  WBCs 11.6 on 10/8  Afebrile  Continue to monitor  16. Urinary retention:   Off urecholine at this time.   Continue proscar.   Cannot use flomax because cannot be crushed.  Check PVRs. 17.  Morbidly obese  Encouraged weight loss  LOS: 1 days A FACE TO FACE EVALUATION WAS PERFORMED  Ankit Lorie Phenix 07/27/2019, 10:09 AM

## 2019-07-28 ENCOUNTER — Inpatient Hospital Stay (HOSPITAL_COMMUNITY): Payer: Medicare Other

## 2019-07-28 ENCOUNTER — Inpatient Hospital Stay (HOSPITAL_COMMUNITY): Payer: Medicare Other | Admitting: Speech Pathology

## 2019-07-28 ENCOUNTER — Inpatient Hospital Stay (HOSPITAL_COMMUNITY): Payer: Medicare Other | Admitting: Physical Therapy

## 2019-07-28 DIAGNOSIS — Z794 Long term (current) use of insulin: Secondary | ICD-10-CM

## 2019-07-28 LAB — GLUCOSE, CAPILLARY
Glucose-Capillary: 114 mg/dL — ABNORMAL HIGH (ref 70–99)
Glucose-Capillary: 155 mg/dL — ABNORMAL HIGH (ref 70–99)
Glucose-Capillary: 176 mg/dL — ABNORMAL HIGH (ref 70–99)
Glucose-Capillary: 198 mg/dL — ABNORMAL HIGH (ref 70–99)
Glucose-Capillary: 220 mg/dL — ABNORMAL HIGH (ref 70–99)

## 2019-07-28 MED ORDER — INSULIN GLARGINE 100 UNIT/ML ~~LOC~~ SOLN
5.0000 [IU] | Freq: Every day | SUBCUTANEOUS | Status: DC
  Administered 2019-07-28 – 2019-07-31 (×4): 5 [IU] via SUBCUTANEOUS
  Filled 2019-07-28 (×4): qty 0.05

## 2019-07-28 MED ORDER — GLYCOPYRROLATE 1 MG PO TABS
2.0000 mg | ORAL_TABLET | Freq: Three times a day (TID) | ORAL | Status: DC
  Administered 2019-07-28 – 2019-08-08 (×33): 2 mg
  Filled 2019-07-28 (×35): qty 2

## 2019-07-28 MED ORDER — METHOCARBAMOL 500 MG PO TABS
500.0000 mg | ORAL_TABLET | Freq: Three times a day (TID) | ORAL | Status: DC | PRN
  Administered 2019-07-28 – 2019-08-07 (×12): 500 mg via ORAL
  Filled 2019-07-28 (×13): qty 1

## 2019-07-28 NOTE — Progress Notes (Signed)
Social Work Assessment and Plan   Patient Details  Name: Michael Bright MRN: [id number] Date of Birth: 05/25/1956  Today's Date: 07/28/2019  Problem List:  Patient Active Problem List   Diagnosis Date Noted  . Morbidly obese ([facility])   . Urinary retention   . Leukocytosis   . Anemia of chronic disease   . Controlled type 2 diabetes mellitus with hyperglycemia ([facility])   . Septic shock ([facility]) 07/20/2019  . Sepsis with acute renal failure without septic shock ([facility])   . Urinary tract infection without hematuria   . Acute blood loss anemia   . Dysphagia due to recent cerebral infarction   . Diabetes mellitus type 2 in obese ([facility])   . Morbid obesity ([facility])   . Status post insertion of percutaneous endoscopic gastrostomy (PEG) tube ([facility])   . Stroke, small vessel ([facility]) 06/27/2019  . Aspiration pneumonia ([facility]) 06/14/2019  . Essential hypertension 06/14/2019  . Type 2 diabetes mellitus with hyperlipidemia ([facility]) 06/14/2019  . Acute respiratory failure ([facility])   . Acute CVA (cerebrovascular accident) (H[facility]) 05/30/2019  . 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 Dysphagia 05/30/2019  . Renal insufficiency 05/30/2019  . Diplopia   . Debility    Past Medical History:  Past Medical History:  Diagnosis Date  . Acute kidney injury ([facility]CC)   . Diabetes ([facility])   . Pneumonia    aspiration pneumonia  . Septic shock ([facility])   . Stroke (H[facility])   . Urinary tract infection    Past Surgical History:  Past Surgical History:  Procedure Laterality Date  . CHOLECYSTECTOMY    . IR GASTROSTOMY TUBE MOD SED  07/04/2019   Social History:  reports that he has never smoked. He has never used smokeless tobacco. He reports current alcohol use. He reports that he does not use drugs.  Family / Support Systems Marital Status: Married Patient Roles: Spouse, Parent Spouse/Significant Other: wife, Michael Bright @ (C) [phone] Children: son, Michael Bright and daughter,  Michael Bright - both oiving locally and working f/t Anticipated Caregiver: wife and son (daugther lives nearby) Ability/Limitations of Caregiver: Min A for wife (wife has lifting restrictions of <5lbs due to cervical injury); son able to physically assist (both wife and son are on FMLA) Caregiver Availability: 24/7 Family Dynamics: Pt describes wife and children as very supportive.  Wife and son have already gotten FMLA approval.  Social History Preferred language: English Religion: Non-Denominational Cultural Background: NA Read: Yes Write: Yes Employment Status: Retired Marine scientist Issues: NOne Guardian/Conservator: None - per MD, pt is capable of making decisions on his own behalf.   Abuse/Neglect Abuse/Neglect Assessment Can Be Completed: Yes Physical Abuse: Denies Verbal Abuse: Denies Sexual Abuse: Denies Exploitation of patient/patient's resources: Denies Self-Neglect: Denies  Emotional Status Pt's affect, behavior and adjustment status: Pt smiling when entering room and reports, "just glad to be alive" and talks about fears at time of readmission that he "wouldn't survive".  He is pleased to be able to return to CIR and begin working with therapies again.  Very motivated and denies any significant emotional distress.  I believe neuropsych did see patient during first CIR admission - will monitor if would benefit from a consult with this stay. Recent Psychosocial Issues: lengthy hospitalization Psychiatric History: None Substance Abuse History: none  Patient / Family Perceptions, Expectations & Goals Pt/Family understanding of illness & functional limitations: Pt and wife with good awareness of medical decline at home and of current functional  limitations/ need for return to CIR. Premorbid pt/family roles/activities: Pt completely independent prior to initial admission in Sept. at home and in community. Anticipated changes in roles/activities/participation: Per overall  goals of supervision, wife to resume primary caregiver support role. Pt/family expectations/goals: Both pt and wife hopeful he can regain strength and function he had when d/c'd home after prior CIR.  Community [employer] Agencies: None Premorbid Home Care/DME Agencies: Other (Comment)([facility]) Transportation available at discharge: yes  Discharge Planning Living Arrangements: Spouse/significant other Support Systems: Spouse/significant other, Children Type of Residence: Private residence Insurance Resources: [employer], Multimedia programmer (specify)(BCBS (primary)) Financial Resources: Employment Financial Screen Referred: No Living Expenses: Own Money Management: Patient, Spouse Does the patient have any problems obtaining your medications?: No Home Management: pt and spouse Patient/Family Preliminary Plans: Pt to d/c home with wife providing 24/7 support and additional assist of adult children. Social Work Anticipated Follow Up Needs: HH/OP Expected length of stay: 2 weeks  Clinical Impression Unfortunate gentleman who is returning to CIR for debility following medical decline at home (on CIR in Sept 2020).  Pt and family very hopeful he can regain his prior levels of supervision with last d/c.  Wife and family able to provide 24/7 support.  Will follow for support and d/c planning needs.  Yusuf Yu 07/28/2019, 1:40 PM

## 2019-07-28 NOTE — Progress Notes (Signed)
[place] PHYSICAL MEDICINE & REHABILITATION PROGRESS NOTE  Subjective/Complaints: Patient seen sitting up in his chair working with therapy this morning.  He states he slept well overnight.  Discussed with therapies and patient as well as breathing, cough, secretions.  ROS: + Cough, difficulty managing secretions.  Denies CP, SOB, N/V/D  Objective: Vital Signs: Blood pressure 137/88, pulse 83, temperature 97.8 F (36.6 C), resp. rate 18, height 6' (1.829 m), weight (!) 137 kg, SpO2 96 %. Dg Chest 2 View  Result Date: 07/27/2019 CLINICAL DATA:  63 year-old male with history of cough and shortness of breath. EXAM: CHEST - 2 VIEW COMPARISON:  Chest x-ray 07/22/2019. FINDINGS: Lung volumes are normal. No consolidative airspace disease. No pleural effusions. No pneumothorax. No pulmonary nodule or mass noted. Pulmonary vasculature and the cardiomediastinal silhouette are within normal limits. IMPRESSION: No radiographic evidence of acute cardiopulmonary disease. Electronically Signed   By: Trudie Reed M.D.   On: 07/27/2019 12:26   Recent Labs    07/26/19 0352 07/27/19 0651  WBC 10.6* 11.6*  HGB 11.5* 11.6*  HCT 35.3* 36.1*  PLT 235 286   Recent Labs    07/26/19 0352 07/27/19 0651  NA 137 137  K 4.1 4.3  CL 103 101  CO2 26 28  GLUCOSE 214* 199*  BUN 21 16  CREATININE 0.73 0.58*  CALCIUM 8.8* 8.8*    Physical Exam: BP 137/88 (BP Location: Left Arm)   Pulse 83   Temp 97.8 F (36.6 C)   Resp 18   Ht 6' (1.829 m)   Wt (!) 137 kg   SpO2 96%   BMI 40.96 kg/m  Constitutional: No distress . Vital signs reviewed.  Morbidly obese. HENT: Normocephalic.  Atraumatic. Neck stoma closed. Eyes: EOMI. No discharge. Cardiovascular: No JVD. Respiratory: Normal effort.  No stridor. GI: Non-distended.  + PEG. Skin: See above. Psych: Normal mood.  Normal behavior. Musc: Lower extremity edema.   No signs of nerve impingement at shoulder, no limitations with PROM of left  shoulder Neuro: Alert Motor: Grossly 4+/5 throughout, except for left shoulder flexion/abduction (some limitation due to pain), unchanged Dysphonia with wet voice, unchanged  Assessment/Plan: 1. Functional deficits secondary to debility which require 3+ hours per day of interdisciplinary therapy in a comprehensive inpatient rehab setting.  Physiatrist is providing close team supervision and 24 hour management of active medical problems listed below.  Physiatrist and rehab team continue to assess barriers to discharge/monitor patient progress toward functional and medical goals  Care Tool:  Bathing    Body parts bathed by patient: Right arm, Left arm, Abdomen, Chest, Right upper leg, Left upper leg, Face   Body parts bathed by helper: Front perineal area, Buttocks, Right lower leg, Left lower leg     Bathing assist Assist Level: Moderate Assistance - Patient 50 - 74%     Upper Body Dressing/Undressing Upper body dressing   What is the patient wearing?: Pull over shirt    Upper body assist Assist Level: Minimal Assistance - Patient > 75%    Lower Body Dressing/Undressing Lower body dressing      What is the patient wearing?: Pants     Lower body assist Assist for lower body dressing: Maximal Assistance - Patient 25 - 49%     Toileting Toileting    Toileting assist Assist for toileting: Maximal Assistance - Patient 25 - 49%     Transfers Chair/bed transfer  Transfers assist  Chair/bed transfer activity did not occur: Safety/medical concerns  Chair/bed transfer assist level: Dependent - Patient 0%(STEDY)     Locomotion Ambulation   Ambulation assist      Assist level: 2 helpers Assistive device: Walker-rolling Max distance: 12'   Walk 10 feet activity   Assist     Assist level: 2 helpers     Walk 50 feet activity   Assist Walk 50 feet with 2 turns activity did not occur: Safety/medical concerns         Walk 150 feet activity   Assist  Walk 150 feet activity did not occur: Safety/medical concerns         Walk 10 feet on uneven surface  activity   Assist Walk 10 feet on uneven surfaces activity did not occur: Safety/medical concerns         Wheelchair     Assist Will patient use wheelchair at discharge?: Yes Type of Wheelchair: Manual    Wheelchair assist level: Supervision/Verbal cueing Max wheelchair distance: 45'    Wheelchair 50 feet with 2 turns activity    Assist        Assist Level: Supervision/Verbal cueing   Wheelchair 150 feet activity     Assist Wheelchair 150 feet activity did not occur: Safety/medical concerns         Medical Problem List and Plan: 1. Debility secondary to septic shock from UTI and Klebsiella pneumonia.bacteremia with rhinovirus.  Continue CIR 2. Antithrombotics: -DVT/anticoagulation:Pharmaceutical:Heparin -antiplatelet therapy: ASA/Plavix. 3. Pain Management:tylenol prn.  Continue Lidoderm patch for left shoulder  Trial Robaxin 500 3 times daily as needed on 10/9 4. Mood:LCSW to follow for evaluation and support. -antipsychotic agents: N/A 5. Neuropsych: This patientiscapable of making decisions on hisown behalf. 6. Skin/Wound Care:Routine pressure relief measures. 7. Fluids/Electrolytes/Nutrition:N.p.o.  Tube feeds qid with water flushes.  8. Klebsiella PNA bacteremia/UTI: On Cefdinir IV until 08/02/2019. 9. 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   Added water flushes 11. T2DM with hyperglycemia:Monitor BSevery 4 hours with SSI and May need to resume lantus  Lantus 5 units daily started on  10/9  Labile on 10/9  Monitor with increased mobility 12 HTN: Monitor BP tid--currently controlled off medications.  Controlled on 10/9  Monitor with increased mobility 13. OSA: Continue CPAP whenever asleep.  14. Anemia of chronic illness:   Hemoglobin 11.6 on 10/8, labs ordered for Monday  Continue to monitor  15.  Leukocytosis:  WBCs 11.6 on 10/8, labs ordered for Monday  Afebrile  Continue to monitor  16. Urinary retention:   Off urecholine at this time.   Continue proscar.   Cannot use flomax because cannot be crushed.  PVRs not performed, will discuss with nursing 17.  Morbidly obese  Encouraged weight loss  LOS: 2 days A FACE TO FACE EVALUATION WAS PERFORMED  Ankit Lorie Phenix 07/28/2019, 9:51 AM

## 2019-07-28 NOTE — Progress Notes (Addendum)
Physical Therapy Session Note  Patient Details  Name: Michael Bright MRN: [id number] Date of Birth: 10/08/1956  Today's Date: 07/28/2019 PT Individual Time: 1005-1100 PT Individual Time Calculation (min): 55 min   Short Term Goals: Week 1:  PT Short Term Goal 1 (Week 1): Pt will be able to perform functional bed mobility with min assist PT Short Term Goal 2 (Week 1): Pt will be able to perform basic transfers with min assist PT Short Term Goal 3 (Week 1): Pt will be able to gait x 50' with min assist PT Short Term Goal 4 (Week 1): Pt will be able to initiate stair training  Skilled Therapeutic Interventions/Progress Updates:  Pt received in room with RN present administering medication. Pt c/o unrated LUE shoulder pain & RN administers pain meds. Assisted pt with doffing gown but pt able to don shirt with supervision & extra time (pt already has pants, footwear & L AFO donned). Pt performed sit<>stand transfers with multiple attempts & extra time for sit>stand as well as cuing for increased anterior weight shifting & hand placement. Pt requires cuing for LLE foot placement flat on floor vs narrow BOS & L ankle attempting to invert. Pt attempts to march in place with LOB but pt able to correct with only min assist. After RN administered meds, transported pt to dayroom. In dayroom, pt attempts sit>stand multiple times but unable to come to full upright standing 2/2 insufficient anterior weight shifting, posterior LOB, and decreased BLE/RUE strength to push to standing. Pt then performs BUE/BLE strengthening exercises consisting of 4 # weighted bar chest press & bicep curls to LUE tolerance and long arc quads and seated hip flexion with 5# ankle weights. Pt propels w/c back to room with BUE & supervision for BLE strengthening. Reviewed use of incentive spirometer & flutter valve with pt. Pt left in w/c with chair alarm donned & all needs in reach.  Pt with very wet sounding voice - RN & PA [name])  aware.  Therapy Documentation Precautions:  Precautions Precautions: Fall Precaution Comments: PEG; NPO Required Braces or Orthoses: Other Brace Other Brace: L AFO Restrictions Weight Bearing Restrictions: No     Therapy/Group: Individual Therapy  Waunita Schooner 07/28/2019, 11:02 AM

## 2019-07-28 NOTE — Progress Notes (Signed)
Pt c/o increased L arm pain. Full sensation, no numbness, no obvious signs of injury. Provider will be notified during morning rounds for further evaluation.

## 2019-07-28 NOTE — Care Management (Signed)
[facility] Individual Statement of Services  Patient Name:  Michael Bright  Date:  07/28/2019  Welcome to the [facility].  Our goal is to provide you with an individualized program based on your diagnosis and situation, designed to meet your specific needs.  With this comprehensive rehabilitation program, you will be expected to participate in at least 3 hours of rehabilitation therapies Monday-Friday, with modified therapy programming on the weekends.  Your rehabilitation program will include the following services:  Physical Therapy (PT), Occupational Therapy (OT), Speech Therapy (ST),  24 hour per day rehabilitation nursing, Neuropsychology, Case Management (Social Worker), Rehabilitation Medicine, Nutrition Services and Pharmacy Services  Weekly team conferences will be held on Tuesdays to discuss your progress.  Your Social Worker will talk with you frequently to get your input and to update you on team discussions.  Team conferences with you and your family in attendance may also be held.  Expected length of stay: 2 weeks   Overall anticipated outcome: supervision  Depending on your progress and recovery, your program may change. Your Social Worker will coordinate services and will keep you informed of any changes. Your Social Worker's name and contact numbers are listed  below.  The following services may also be recommended but are not provided by the [facility] will be made to provide these services after discharge if needed.  Arrangements include referral to agencies that provide these services.  Your insurance has been verified to be:  [place]; Medicare Your primary doctor is:  Alma Friendly  Pertinent information will be shared with your doctor and your insurance company.  Social Worker:  [place], [place] or [phone]   Information discussed with and copy given to patient by: Lennart Pall, 07/28/2019, 1:42 PM

## 2019-07-28 NOTE — Progress Notes (Signed)
Occupational Therapy Session Note  Patient Details  Name: Michael Bright MRN: [id number] Date of Birth: 03-Nov-1955  Today's Date: 07/28/2019 OT Individual Time: 1305-1400 OT Individual Time Calculation (min): 55 min    Short Term Goals: Week 1:  OT Short Term Goal 1 (Week 1): Pt will complete 1 grooming task in standing OT Short Term Goal 2 (Week 1): Pt will perform sit to stands for clothing mangement with min A OT Short Term Goal 3 (Week 1): Pt will complete toileting tasks with min A OT Short Term Goal 4 (Week 1): Pt will transfer to the toilet/ BSC with min A consistently  Skilled Therapeutic Interventions/Progress Updates:    1:1 Focus on basic stand pivot transfers with RW, sit to stands with forward weight shifts, stand to sit with control descents, activity tolerance. A armed chair placed in front of pt and his hands placed on arm rests and focus on maintaining forward weight shift to while lifting bottom with stance. Perform alternating stepping with feet to target with focus on terminal hip and knee extension - performed on both sides.   Performed gentle shoulder mobilization and ROM and heat to help with left bicep pain.  Performed functional ambulation ~ 15 feet with RW with mod A with w/c follow and then able to turn with mod cues and returned to mat. Also performed side stepping  In both directions with cues for RW management and coordination of lef tLE  Therapy Documentation Precautions:  Precautions Precautions: Fall Precaution Comments: PEG; NPO Required Braces or Orthoses: Other Brace Other Brace: L AFO Restrictions Weight Bearing Restrictions: No Pain:  ongoing pain in left arm- applied heat in session and scapular mobs  Therapy/Group: Individual Therapy  [name] [facility] 07/28/2019, 3:41 PM

## 2019-07-28 NOTE — Progress Notes (Signed)
Speech Language Pathology Daily Session Note  Patient Details  Name: Michael Bright MRN: [id number] Date of Birth: 12-Mar-1956  Today's Date: 07/28/2019 SLP Individual Time: 0725-0820 SLP Individual Time Calculation (min): 55 min  Short Term Goals: Week 1: SLP Short Term Goal 1 (Week 1): Pt will utilizie memory compensatory strategies to recall new, daily information with min A verbal cues. SLP Short Term Goal 2 (Week 1): Pt will perform throat clear and swallow to reduce wet vocal quality with min A verbal cues. SLP Short Term Goal 3 (Week 1): Pt will consume ice chip trials to preserve swallow function with moderate s/s aspiration. SLP Short Term Goal 4 (Week 1): Patient will perform pharyngeal strengthening exercises with Min verbal and visual cues.  Skilled Therapeutic Interventions: Skilled treatment session focused on dysphagia goals. Upon arrival, patient requested to get into the wheelchair for treatment sessions. TED hoes and shoes were donned with total A and patient was transferred to the wheelchair via the [facility]. SLP provided set-up assist with suction toothbrush but patient was overall mod I for thoroughness. SLP introduced the EMST 150 device in which patient was able to perform 25 repetitions at 45 cm H2O with supervision verbal cues for accuracy. RMT utilized in hopes of maximizing cough strength in order for patient to orally expectorate secretions. SLP also provided Min verbal cues for patient to perform pharyngeal strengthening exercises. Patient left upright in wheelchair with alarm on and all needs within reach. Continue with current plan of care.      Pain No/Denies Pain   Therapy/Group: Individual Therapy  Mj Willis 07/28/2019, 12:34 PM

## 2019-07-29 ENCOUNTER — Inpatient Hospital Stay (HOSPITAL_COMMUNITY): Payer: Medicare Other

## 2019-07-29 ENCOUNTER — Inpatient Hospital Stay (HOSPITAL_COMMUNITY): Payer: Medicare Other | Admitting: Speech Pathology

## 2019-07-29 ENCOUNTER — Inpatient Hospital Stay (HOSPITAL_COMMUNITY): Payer: Medicare Other | Admitting: Physical Therapy

## 2019-07-29 LAB — GLUCOSE, CAPILLARY
Glucose-Capillary: 136 mg/dL — ABNORMAL HIGH (ref 70–99)
Glucose-Capillary: 139 mg/dL — ABNORMAL HIGH (ref 70–99)
Glucose-Capillary: 181 mg/dL — ABNORMAL HIGH (ref 70–99)
Glucose-Capillary: 192 mg/dL — ABNORMAL HIGH (ref 70–99)
Glucose-Capillary: 204 mg/dL — ABNORMAL HIGH (ref 70–99)
Glucose-Capillary: 208 mg/dL — ABNORMAL HIGH (ref 70–99)

## 2019-07-29 NOTE — Progress Notes (Signed)
[place] PHYSICAL MEDICINE & REHABILITATION PROGRESS NOTE  Subjective/Complaints: Seen in PT  ROS: + Cough, difficulty managing secretions.  Denies CP, SOB, N/V/D  Objective: Vital Signs: Blood pressure 129/86, pulse 86, temperature 98.2 F (36.8 C), temperature source Oral, resp. rate 18, height 6' (1.829 m), weight (!) 137.7 kg, SpO2 98 %. Dg Chest 2 View  Result Date: 07/27/2019 CLINICAL DATA:  63 year-old male with history of cough and shortness of breath. EXAM: CHEST - 2 VIEW COMPARISON:  Chest x-ray 07/22/2019. FINDINGS: Lung volumes are normal. No consolidative airspace disease. No pleural effusions. No pneumothorax. No pulmonary nodule or mass noted. Pulmonary vasculature and the cardiomediastinal silhouette are within normal limits. IMPRESSION: No radiographic evidence of acute cardiopulmonary disease. Electronically Signed   By: Trudie Reed M.D.   On: 07/27/2019 12:26   Recent Labs    07/27/19 0651  WBC 11.6*  HGB 11.6*  HCT 36.1*  PLT 286   Recent Labs    07/27/19 0651  NA 137  K 4.3  CL 101  CO2 28  GLUCOSE 199*  BUN 16  CREATININE 0.58*  CALCIUM 8.8*    Physical Exam: BP 129/86 (BP Location: Left Arm)   Pulse 86   Temp 98.2 F (36.8 C) (Oral)   Resp 18   Ht 6' (1.829 m)   Wt (!) 137.7 kg   SpO2 98%   BMI 41.17 kg/m  Constitutional: No distress . Vital signs reviewed.  Morbidly obese. HENT: Normocephalic.  Atraumatic. Neck stoma closed. Eyes: EOMI. No discharge. Cardiovascular: No JVD. Respiratory: Normal effort.  No stridor. GI: Non-distended.  + PEG. Skin: See above. Psych: Normal mood.  Normal behavior. Musc: Lower extremity edema.   No signs of nerve impingement at shoulder, no limitations with PROM of left shoulder Neuro: Alert Motor: Grossly 4+/5 throughout, except for left shoulder flexion/abduction (some limitation due to pain), unchanged Dysphonia with wet voice, unchanged  Assessment/Plan: 1. Functional deficits  secondary to debility which require 3+ hours per day of interdisciplinary therapy in a comprehensive inpatient rehab setting.  Physiatrist is providing close team supervision and 24 hour management of active medical problems listed below.  Physiatrist and rehab team continue to assess barriers to discharge/monitor patient progress toward functional and medical goals  Care Tool:  Bathing    Body parts bathed by patient: Right arm, Left arm, Abdomen, Chest, Right upper leg, Left upper leg, Face   Body parts bathed by helper: Front perineal area, Buttocks, Right lower leg, Left lower leg     Bathing assist Assist Level: Moderate Assistance - Patient 50 - 74%     Upper Body Dressing/Undressing Upper body dressing   What is the patient wearing?: Pull over shirt    Upper body assist Assist Level: Minimal Assistance - Patient > 75%    Lower Body Dressing/Undressing Lower body dressing      What is the patient wearing?: Pants     Lower body assist Assist for lower body dressing: Maximal Assistance - Patient 25 - 49%     Toileting Toileting    Toileting assist Assist for toileting: Maximal Assistance - Patient 25 - 49%     Transfers Chair/bed transfer  Transfers assist  Chair/bed transfer activity did not occur: Safety/medical concerns  Chair/bed transfer assist level: Moderate Assistance - Patient 50 - 74%     Locomotion Ambulation   Ambulation assist      Assist level: 2 helpers Assistive device: Walker-rolling Max distance: 12'   Walk 10  feet activity   Assist     Assist level: 2 helpers     Walk 50 feet activity   Assist Walk 50 feet with 2 turns activity did not occur: Safety/medical concerns         Walk 150 feet activity   Assist Walk 150 feet activity did not occur: Safety/medical concerns         Walk 10 feet on uneven surface  activity   Assist Walk 10 feet on uneven surfaces activity did not occur: Safety/medical  concerns         Wheelchair     Assist Will patient use wheelchair at discharge?: Yes Type of Wheelchair: Manual    Wheelchair assist level: Supervision/Verbal cueing Max wheelchair distance: 100 ft    Wheelchair 50 feet with 2 turns activity    Assist        Assist Level: Supervision/Verbal cueing   Wheelchair 150 feet activity     Assist Wheelchair 150 feet activity did not occur: Safety/medical concerns         Medical Problem List and Plan: 1. Debility secondary to septic shock from UTI and Klebsiella pneumonia.bacteremia with rhinovirus.  Continue CIR tolerating PT, OT  Cont SLP for swallow  2. Antithrombotics: -DVT/anticoagulation:Pharmaceutical:Heparin -antiplatelet therapy: ASA/Plavix. 3. Pain Management:tylenol prn.  Continue Lidoderm patch for left shoulder  Trial Robaxin 500 3 times daily as needed on 10/9 4. Mood:LCSW to follow for evaluation and support. -antipsychotic agents: N/A 5. Neuropsych: This patientiscapable of making decisions on hisown behalf. 6. Skin/Wound Care:Routine pressure relief measures. 7. Fluids/Electrolytes/Nutrition:N.p.o.  Tube feeds qid with water flushes.  8. Klebsiella PNA bacteremia/UTI: On Cefdinir IV until 08/02/2019. 9. 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   Added water flushes 11. T2DM with hyperglycemia:Monitor BSevery 4 hours with SSI and May need to resume lantus  Lantus 5 units daily started on 10/9 CBG (last 3)  Recent Labs    07/28/19 2046 07/29/19 0005 07/29/19 0358  GLUCAP 198* 192* 139*  Improved this am monitor on current dose   12  HTN: Monitor BP tid--currently controlled off medications.  Controlled on 10/9  Monitor with increased mobility 13. OSA: Continue CPAP whenever asleep.  14. Anemia of chronic illness:   Hemoglobin 11.6 on 10/8, labs ordered for Monday  Continue to monitor  15.  Leukocytosis:  WBCs 11.6 on 10/8, labs ordered for Monday  Afebrile  Continue to monitor  16. Urinary retention:   Off urecholine at this time.   Continue proscar.   Cannot use flomax because cannot be crushed.  PVRs not performed, will discuss with nursing 17.  Morbidly obese  Encouraged weight loss  LOS: 3 days A FACE TO FACE EVALUATION WAS PERFORMED  Charlett Blake 07/29/2019, 8:11 AM

## 2019-07-29 NOTE — Progress Notes (Signed)
Speech Language Pathology Daily Session Note  Patient Details  Name: Michael Bright MRN: [id number] Date of Birth: 06-23-1956  Today's Date: 07/29/2019 SLP Individual Time: 2330-0762 SLP Individual Time Calculation (min): 45 min  Short Term Goals: Week 1: SLP Short Term Goal 1 (Week 1): Pt will utilizie memory compensatory strategies to recall new, daily information with min A verbal cues. SLP Short Term Goal 2 (Week 1): Pt will perform throat clear and swallow to reduce wet vocal quality with min A verbal cues. SLP Short Term Goal 3 (Week 1): Pt will consume ice chip trials to preserve swallow function with moderate s/s aspiration. SLP Short Term Goal 4 (Week 1): Patient will perform pharyngeal strengthening exercises with Min verbal and visual cues.  Skilled Therapeutic Interventions:  Skilled treatment session focused on dysphagia goals. SLP facilitated session by having pt demonstrate pharyngeal  strengthening exercises. Pt with difficulty correctly performing [place]. After great effort and Max A verbal/visual cues, pt able to keep tongue in place between teeth when producing swallow. Pt able to produce 2 sets of 4. SLP also facilitated session by providing skilled observation of pt performing EMST exercises. Pt with continued nasal emission during attempts but stated that "nose clip doesn't work." SLP recommended pt hold nose closed with his left hand during exercise. Pt able to complete 3 sets of 15  with self-perceived effort of 7-8 out of 10. Of note, pt's resonance sounds more distorted during this session and he has consistent wet vocal quality despite pt's attempts to clear with cough/throat clears. This combination lower pt's overall speech intelligibility to ~75% at the sentence level. Pt left upright in wheelchair with all needs within reach. Continue per current plan of care.       Pain Pain Assessment Pain Scale: 0-10 Pain Score: 0-No pain  Therapy/Group: Individual  Therapy  Taran Haynesworth 07/29/2019, 10:21 AM

## 2019-07-29 NOTE — Progress Notes (Addendum)
Physical Therapy Session Note  Patient Details  Name: Michael Bright MRN: [id number] Date of Birth: [birth year]  Today's Date: 07/29/2019 PT Individual Time: 0736-0832 PT Individual Time Calculation (min): 56 min   Short Term Goals: Week 1:  PT Short Term Goal 1 (Week 1): Pt will be able to perform functional bed mobility with min assist PT Short Term Goal 2 (Week 1): Pt will be able to perform basic transfers with min assist PT Short Term Goal 3 (Week 1): Pt will be able to gait x 50' with min assist PT Short Term Goal 4 (Week 1): Pt will be able to initiate stair training  Skilled Therapeutic Interventions/Progress Updates:  Pt received in bed & agreeable to tx, reporting L shoulder feels much better today. Pt transfers supine>sitting EOB with bed rails, HOB slightly elevated, and supervision. Pt requesting to change clothes 2/2 urinal spillage. Pt transfers sit<>stand EOB with min assist to assist with dressing. Pt dons clean shirt with supervision and assistance for threading pants on BLE and pulling over L hip. Therapist dons B ted hose & shoes & L AFO for time management. Pt transfers sit>stand from elevated EOB with min assist with ongoing cuing for hand placement as pt continues to attempt to pull up on RW. Pt transfers bed>w/c via stand pivot with RW & up to mod assist 2/2 pt beginning to have multiple LOB and ultimately sitting down in w/c for safety. Transported pt to/from gym via w/c dependent assist for time management. Focused on sit<>stand from w/c seat with therapist providing demo & education re: technique to increase ease of movement. Also reviewed BLE foot placement for increased BOS. Pt ambulates 40 ft + 40 ft + 50 ft with RW & min assist but cuing to slow down and small steps during turns. At end of session pt left in w/c with chair alarm donned & all needs in reach.  Therapy Documentation Precautions:  Precautions Precautions: Fall Precaution Comments: PEG; NPO Required  Braces or Orthoses: Other Brace Other Brace: L AFO Restrictions Weight Bearing Restrictions: No  Pain: No c/o pain during session.   Therapy/Group: Individual Therapy  Waunita Schooner 07/29/2019, 8:34 AM

## 2019-07-29 NOTE — Progress Notes (Signed)
Occupational Therapy Session Note  Patient Details  Name: Phi Avans MRN: [id number] Date of Birth: Dec 02, 1955  Today's Date: 07/29/2019 OT Individual Time: 1450-1600 OT Individual Time Calculation (min): 70 min    Short Term Goals: Week 1:  OT Short Term Goal 1 (Week 1): Pt will complete 1 grooming task in standing OT Short Term Goal 2 (Week 1): Pt will perform sit to stands for clothing mangement with min A OT Short Term Goal 3 (Week 1): Pt will complete toileting tasks with min A OT Short Term Goal 4 (Week 1): Pt will transfer to the toilet/ BSC with min A consistently  Skilled Therapeutic Interventions/Progress Updates:    1:1. Pt reporting pain in L shoulder requesting muscle relaxer. Pt given via PEG by RN. Pt requesting to work on "getting up and down" and declines bathing and dressing. Pt completes all transfer from EOB>w/c<>BSC over toilet in stedy with S for sit to stand in stedy. Pt sit to stand at high low table with MIN A for power up and up to MAX A for correction of posterior lean. Pt improves to CGA with VC for bringing RLE underneath for improved body mechanics. Pt completes standing pipe tree activity with MIN A for static standing balance and MOD VC for problem solving errors and equal weight bearing through BLE. Pt stands >5 min during activity and RLE nearly buckles d/t fatigue. Ed re resting prior to max fatigue level. After rest break pt completes standing marches at table with VC for equal hip flexor activation R v L for dynamic standing balance required for functional mobility. Pt reports need to toilet and completes CM with MIN A for standing balance and total A for hygiene post BM. Exitd session with tp seated in bed, call light in reach and all needs met  Therapy Documentation Precautions:  Precautions Precautions: Fall Precaution Comments: PEG; NPO Required Braces or Orthoses: Other Brace Other Brace: L AFO Restrictions Weight Bearing Restrictions:  No General:   Vital Signs:   Pain:   ADL: ADL Eating: NPO Grooming: Moderate assistance Where Assessed-Grooming: Wheelchair Upper Body Bathing: Minimal assistance Where Assessed-Upper Body Bathing: Shower Lower Body Bathing: Maximal assistance Where Assessed-Lower Body Bathing: Shower Upper Body Dressing: Minimal assistance Where Assessed-Upper Body Dressing: Sitting at sink Lower Body Dressing: Moderate assistance Where Assessed-Lower Body Dressing: Sitting at sink Toilet Transfer: Not assessed Social research officer, 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

## 2019-07-30 LAB — GLUCOSE, CAPILLARY
Glucose-Capillary: 150 mg/dL — ABNORMAL HIGH (ref 70–99)
Glucose-Capillary: 160 mg/dL — ABNORMAL HIGH (ref 70–99)
Glucose-Capillary: 189 mg/dL — ABNORMAL HIGH (ref 70–99)
Glucose-Capillary: 199 mg/dL — ABNORMAL HIGH (ref 70–99)
Glucose-Capillary: 213 mg/dL — ABNORMAL HIGH (ref 70–99)
Glucose-Capillary: 214 mg/dL — ABNORMAL HIGH (ref 70–99)

## 2019-07-30 MED ORDER — TROLAMINE SALICYLATE 10 % EX CREA
TOPICAL_CREAM | Freq: Two times a day (BID) | CUTANEOUS | Status: DC | PRN
  Filled 2019-07-30: qty 85

## 2019-07-30 MED ORDER — MUSCLE RUB 10-15 % EX CREA
TOPICAL_CREAM | Freq: Two times a day (BID) | CUTANEOUS | Status: DC | PRN
  Administered 2019-07-30: 22:00:00 via TOPICAL
  Filled 2019-07-30: qty 85

## 2019-07-30 NOTE — Progress Notes (Signed)
[place] [facility] PHYSICAL MEDICINE & REHABILITATION PROGRESS NOTE  Subjective/Complaints: No issues overnight.  Has some tingling in his right hand in the second and third digits.  He is using a walker frequently.  No history of wrist surgery Also has some pain in the left shoulder and bicep region ROS: + Cough, difficulty managing secretions.  Denies CP, SOB, N/V/D  Objective: Vital Signs: Blood pressure 137/75, pulse 83, temperature (!) 97.5 F (36.4 C), temperature source Oral, resp. rate 16, height 6' (1.829 m), weight (!) 136.8 kg, SpO2 98 %. No results found. No results for input(s): WBC, HGB, HCT, PLT in the last 72 hours. No results for input(s): NA, K, CL, CO2, GLUCOSE, BUN, CREATININE, CALCIUM in the last 72 hours.  Physical Exam: BP 137/75 (BP Location: Left Arm)   Pulse 83   Temp (!) 97.5 F (36.4 C) (Oral)   Resp 16   Ht 6' (1.829 m)   Wt (!) 136.8 kg   SpO2 98%   BMI 40.90 kg/m  Constitutional: No distress . Vital signs reviewed.  Morbidly obese. HENT: Normocephalic.  Atraumatic. Neck stoma closed. Eyes: EOMI. No discharge. Cardiovascular: No JVD. Respiratory: Normal effort.  No stridor. GI: Non-distended.  + PEG. Skin: See above. Psych: Normal mood.  Normal behavior. Musc: Lower extremity edema.   No signs of nerve impingement at shoulder, no limitations with PROM of left shoulder Neuro: Alert Motor: Grossly 4+/5 throughout, except for left shoulder flexion/abduction (some limitation due to pain), unchanged Dysphonia with wet voice, unchanged  Assessment/Plan: 1. Functional deficits secondary to debility which require 3+ hours per day of interdisciplinary therapy in a comprehensive inpatient rehab setting.  Physiatrist is providing close team supervision and 24 hour management of active medical problems listed below.  Physiatrist and rehab team continue to assess barriers to discharge/monitor patient progress toward functional and medical goals  Care  Tool:  Bathing    Body parts bathed by patient: Right arm, Left arm, Abdomen, Chest, Right upper leg, Left upper leg, Face   Body parts bathed by helper: Front perineal area, Buttocks, Right lower leg, Left lower leg     Bathing assist Assist Level: Moderate Assistance - Patient 50 - 74%     Upper Body Dressing/Undressing Upper body dressing   What is the patient wearing?: Pull over shirt    Upper body assist Assist Level: Minimal Assistance - Patient > 75%    Lower Body Dressing/Undressing Lower body dressing      What is the patient wearing?: Pants     Lower body assist Assist for lower body dressing: Maximal Assistance - Patient 25 - 49%     Toileting Toileting    Toileting assist Assist for toileting: Maximal Assistance - Patient 25 - 49%     Transfers Chair/bed transfer  Transfers assist  Chair/bed transfer activity did not occur: Safety/medical concerns  Chair/bed transfer assist level: Moderate Assistance - Patient 50 - 74%     Locomotion Ambulation   Ambulation assist      Assist level: Minimal Assistance - Patient > 75% Assistive device: Walker-rolling Max distance: 50 ft   Walk 10 feet activity   Assist     Assist level: Minimal Assistance - Patient > 75% Assistive device: Walker-rolling   Walk 50 feet activity   Assist Walk 50 feet with 2 turns activity did not occur: Safety/medical concerns  Assist level: Minimal Assistance - Patient > 75% Assistive device: Walker-rolling    Walk 150 feet activity   Assist  Walk 150 feet activity did not occur: Safety/medical concerns         Walk 10 feet on uneven surface  activity   Assist Walk 10 feet on uneven surfaces activity did not occur: Safety/medical concerns         Wheelchair     Assist Will patient use wheelchair at discharge?: Yes Type of Wheelchair: Manual    Wheelchair assist level: Supervision/Verbal cueing Max wheelchair distance: 100 ft     Wheelchair 50 feet with 2 turns activity    Assist        Assist Level: Supervision/Verbal cueing   Wheelchair 150 feet activity     Assist Wheelchair 150 feet activity did not occur: Safety/medical concerns         Medical Problem List and Plan: 1. Debility secondary to septic shock from UTI and Klebsiella pneumonia.bacteremia with rhinovirus.  Continue CIR tolerating PT, OT  Cont SLP for swallow  2. Antithrombotics: -DVT/anticoagulation:Pharmaceutical:Heparin -antiplatelet therapy: ASA/Plavix. 3. Pain Management:tylenol prn.  Continue Lidoderm patch for left shoulder  Trial Robaxin 500 3 times daily as needed on 10/9 4. Mood:LCSW to follow for evaluation and support. -antipsychotic agents: N/A 5. Neuropsych: This patientiscapable of making decisions on hisown behalf. 6. Skin/Wound Care:Routine pressure relief measures. 7. Fluids/Electrolytes/Nutrition:N.p.o.  Tube feeds qid with water flushes.  8. Klebsiella PNA bacteremia/UTI: On Cefdinir IV until 08/02/2019. 9. 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   Added water flushes 11. T2DM with hyperglycemia:Monitor BSevery 4 hours with SSI and May need to resume lantus  Lantus 5 units daily started on 10/9 CBG (last 3)  Recent Labs    07/30/19 0000 07/30/19 0356 07/30/19 0929  GLUCAP 213* 150* 199*  Improved this am monitor on current dose   12 HTN: Monitor BP tid--currently controlled off medications.  Controlled on 10/11  Monitor with increased mobility Vitals:   07/30/19 0359 07/30/19 0710  BP: 137/75   Pulse: 83   Resp: 16   Temp: (!) 97.5 F  (36.4 C)   SpO2: 98% 98%   13. OSA: Continue CPAP whenever asleep.  14. Anemia of chronic illness:   Hemoglobin 11.6 on 10/8, labs ordered for Monday  Continue to monitor  15.  Leukocytosis:  WBCs 11.6 on 10/8, labs ordered for Monday  Afebrile  Continue to monitor  16. Urinary retention:   Off urecholine at this time.   Continue proscar.   Cannot use flomax because cannot be crushed.  PVRs not performed, will discuss with nursing 17.  Morbidly obese  Encouraged weight loss  LOS: 4 days A FACE TO FACE EVALUATION WAS PERFORMED  Charlett Blake 07/30/2019, 11:42 AM

## 2019-07-31 ENCOUNTER — Inpatient Hospital Stay (HOSPITAL_COMMUNITY): Payer: Medicare Other

## 2019-07-31 ENCOUNTER — Inpatient Hospital Stay (HOSPITAL_COMMUNITY): Payer: Medicare Other | Admitting: Physical Therapy

## 2019-07-31 ENCOUNTER — Inpatient Hospital Stay (HOSPITAL_COMMUNITY): Payer: Medicare Other | Admitting: Speech Pathology

## 2019-07-31 ENCOUNTER — Encounter: Payer: BC Managed Care – PPO | Admitting: Physical Medicine and Rehabilitation

## 2019-07-31 LAB — BASIC METABOLIC PANEL
Anion gap: 9 (ref 5–15)
BUN: 18 mg/dL (ref 8–23)
CO2: 25 mmol/L (ref 22–32)
Calcium: 8.7 mg/dL — ABNORMAL LOW (ref 8.9–10.3)
Chloride: 101 mmol/L (ref 98–111)
Creatinine, Ser: 0.72 mg/dL (ref 0.61–1.24)
GFR calc Af Amer: >60 mL/min (ref >60)
GFR calc non Af Amer: >60 mL/min (ref >60)
Glucose, Bld: 186 mg/dL — ABNORMAL HIGH (ref 70–99)
Potassium: 4.2 mmol/L (ref 3.5–5.1)
Sodium: 135 mmol/L (ref 135–145)

## 2019-07-31 LAB — GLUCOSE, CAPILLARY
Glucose-Capillary: 189 mg/dL — ABNORMAL HIGH (ref 70–99)
Glucose-Capillary: 163 mg/dL — ABNORMAL HIGH (ref 70–99)
Glucose-Capillary: 185 mg/dL — ABNORMAL HIGH (ref 70–99)
Glucose-Capillary: 198 mg/dL — ABNORMAL HIGH (ref 70–99)
Glucose-Capillary: 136 mg/dL — ABNORMAL HIGH (ref 70–99)
Glucose-Capillary: 222 mg/dL — ABNORMAL HIGH (ref 70–99)

## 2019-07-31 LAB — CBC
HCT: 36.7 % — ABNORMAL LOW (ref 39.0–52.0)
Hemoglobin: 12 g/dL — ABNORMAL LOW (ref 13.0–17.0)
MCH: 30.3 pg (ref 26.0–34.0)
MCHC: 32.7 g/dL (ref 30.0–36.0)
MCV: 92.7 fL (ref 80.0–100.0)
Platelets: 304 10*3/uL (ref 150–400)
RBC: 3.96 MIL/uL — ABNORMAL LOW (ref 4.22–5.81)
RDW: 12.7 % (ref 11.5–15.5)
WBC: 7.2 10*3/uL (ref 4.0–10.5)
nRBC: 0 % (ref 0.0–0.2)

## 2019-07-31 MED ORDER — INSULIN GLARGINE 100 UNIT/ML ~~LOC~~ SOLN
5.0000 [IU] | Freq: Once | SUBCUTANEOUS | Status: AC
  Administered 2019-07-31: 5 [IU] via SUBCUTANEOUS
  Filled 2019-07-31: qty 0.05

## 2019-07-31 MED ORDER — INSULIN ASPART 100 UNIT/ML ~~LOC~~ SOLN
0.0000 [IU] | Freq: Three times a day (TID) | SUBCUTANEOUS | Status: DC
  Administered 2019-07-31: 2 [IU] via SUBCUTANEOUS
  Administered 2019-07-31: 1 [IU] via SUBCUTANEOUS
  Administered 2019-08-01 (×2): 5 [IU] via SUBCUTANEOUS
  Administered 2019-08-02 (×3): 2 [IU] via SUBCUTANEOUS
  Administered 2019-08-03: 3 [IU] via SUBCUTANEOUS
  Administered 2019-08-03 – 2019-08-04 (×3): 2 [IU] via SUBCUTANEOUS
  Administered 2019-08-04: 3 [IU] via SUBCUTANEOUS
  Administered 2019-08-04: 2 [IU] via SUBCUTANEOUS
  Administered 2019-08-05: 3 [IU] via SUBCUTANEOUS
  Administered 2019-08-05 (×2): 2 [IU] via SUBCUTANEOUS
  Administered 2019-08-06: 1 [IU] via SUBCUTANEOUS
  Administered 2019-08-06: 3 [IU] via SUBCUTANEOUS
  Administered 2019-08-06 – 2019-08-07 (×2): 1 [IU] via SUBCUTANEOUS
  Administered 2019-08-07 (×2): 2 [IU] via SUBCUTANEOUS
  Administered 2019-08-08: 1 [IU] via SUBCUTANEOUS

## 2019-07-31 MED ORDER — INSULIN ASPART 100 UNIT/ML ~~LOC~~ SOLN
0.0000 [IU] | Freq: Every day | SUBCUTANEOUS | Status: DC
  Administered 2019-07-31 – 2019-08-02 (×3): 2 [IU] via SUBCUTANEOUS
  Administered 2019-08-03 – 2019-08-05 (×3): 3 [IU] via SUBCUTANEOUS
  Administered 2019-08-06: 2 [IU] via SUBCUTANEOUS

## 2019-07-31 MED ORDER — FLUTICASONE PROPIONATE 50 MCG/ACT NA SUSP
2.0000 | Freq: Every day | NASAL | Status: DC
  Administered 2019-07-31 – 2019-08-07 (×8): 2 via NASAL
  Filled 2019-07-31: qty 16

## 2019-07-31 MED ORDER — OSMOLITE 1.5 CAL PO LIQD
310.0000 mL | Freq: Three times a day (TID) | ORAL | Status: DC
  Administered 2019-07-31 – 2019-08-02 (×9): 310 mL
  Filled 2019-07-31 (×8): qty 474

## 2019-07-31 MED ORDER — INSULIN GLARGINE 100 UNIT/ML ~~LOC~~ SOLN
10.0000 [IU] | Freq: Every day | SUBCUTANEOUS | Status: DC
  Administered 2019-08-01 – 2019-08-02 (×2): 10 [IU] via SUBCUTANEOUS
  Filled 2019-07-31 (×2): qty 0.1

## 2019-07-31 MED ORDER — GUAIFENESIN 100 MG/5ML PO SOLN
10.0000 mL | Freq: Three times a day (TID) | ORAL | Status: DC
  Administered 2019-07-31 – 2019-08-08 (×34): 200 mg
  Filled 2019-07-31 (×4): qty 10
  Filled 2019-07-31: qty 5
  Filled 2019-07-31: qty 25
  Filled 2019-07-31 (×2): qty 10
  Filled 2019-07-31: qty 5
  Filled 2019-07-31: qty 10
  Filled 2019-07-31: qty 5
  Filled 2019-07-31: qty 10
  Filled 2019-07-31: qty 5
  Filled 2019-07-31 (×19): qty 10
  Filled 2019-07-31: qty 25
  Filled 2019-07-31: qty 10

## 2019-07-31 NOTE — Progress Notes (Signed)
Occupational Therapy Session Note  Patient Details  Name: Michael Bright MRN: [id number] Date of Birth: [birth year]  Today's Date: 07/31/2019 OT Individual Time: 0865-7846 OT Individual Time Calculation (min): 75 min    Short Term Goals: Week 1:  OT Short Term Goal 1 (Week 1): Pt will complete 1 grooming task in standing OT Short Term Goal 2 (Week 1): Pt will perform sit to stands for clothing mangement with min A OT Short Term Goal 3 (Week 1): Pt will complete toileting tasks with min A OT Short Term Goal 4 (Week 1): Pt will transfer to the toilet/ BSC with min A consistently  Skilled Therapeutic Interventions/Progress Updates:    Pt recevied supine, no c/o pain. Pt agreeable to OT session. Pt transitioned to EOB with (S) and use of bed rails. He doffed shirt and completed UB bathing with cueing for encouragement as pt initially stating "I can't reach", but was able to do so without assistance. Suspect guarding d/t pain and fear in L shoulder contributing to limitations. Kinesiotape applied to support humeral head to provide additional support during ROM. Pt tolerated PROM without pain. Pt completed sit <> stand from EOB with min A. Pt able to stand pivot to w/c with min A. Pt donned shirt with min A to pull down over L shoulder. Pt transported to the gym with with total A for time management. Pt completed short distance functional mobility with min A with RW. Pt sat EOM and completed 3x10 sit <> stands with only unilateral support. No RW support. Pt completed forward closed chain and overhead reach with 1 kg ball while standing with each sit <> stand. CGA-min A provided with cueing for anterior weight shift with several minimal LOB posteriorly d/t lack of weight shift. Pt completed finger ladder with  LUE with cueing for technique and for encouragement. Pt was returned to his room and left with RN providing care.   Therapy Documentation Precautions:  Precautions Precautions: Fall Precaution  Comments: PEG; NPO Required Braces or Orthoses: Other Brace Other Brace: L AFO Restrictions Weight Bearing Restrictions: No   Therapy/Group: Individual Therapy  Curtis Sites 07/31/2019, 7:13 AM

## 2019-07-31 NOTE — Plan of Care (Signed)
Gait goal downgraded to CGA 2/2 impaired balance, pt's CLOF at [place]. Will upgrade in future if pt is safely able to ambulate with less assistance.   Problem: RH Ambulation Goal: LTG Patient will ambulate in controlled environment (PT) Description: LTG: Patient will ambulate in a controlled environment, # of feet with assistance (PT). Flowsheets (Taken 07/31/2019 1312) LTG: Pt will ambulate in controlled environ  assist needed:: (downgrade 2/2 impaired balance) Contact Guard/Touching assist LTG: Ambulation distance in controlled environment: 150 ft with LRAD

## 2019-07-31 NOTE — Progress Notes (Signed)
Speech Language Pathology Daily Session Note  Patient Details  Name: Michael Bright MRN: [id number] Date of Birth: 12-20-1955  Today's Date: 07/31/2019 SLP Individual Time: 1015-1055 SLP Individual Time Calculation (min): 40 min  Short Term Goals: Week 1: SLP Short Term Goal 1 (Week 1): Pt will utilizie memory compensatory strategies to recall new, daily information with min A verbal cues. SLP Short Term Goal 2 (Week 1): Pt will perform throat clear and swallow to reduce wet vocal quality with min A verbal cues. SLP Short Term Goal 3 (Week 1): Pt will consume ice chip trials to preserve swallow function with moderate s/s aspiration. SLP Short Term Goal 4 (Week 1): Patient will perform pharyngeal strengthening exercises with Min verbal and visual cues.  Skilled Therapeutic Interventions: Skilled treatment session focused on dysphagia goals. SLP facilitated session by providing Min A verbal cues to perform 10 repetitions of pharyngeal strengthening exercises accurately. Patient reported he had already performed is RMT exercises and consumed minimal trials of ice with consistent coughing/wet vocal quality.  Patient with intermittent increased cough strength to clear secretions. Recommend patient remain NPO. Patient left upright in wheelchair with all needs within reach. Continue with current plan of care.      Pain No/Denies Pain   Therapy/Group: Individual Therapy  Michael Bright 07/31/2019, 12:38 PM

## 2019-07-31 NOTE — Progress Notes (Signed)
Physical Therapy Session Note  Patient Details  Name: Michael Bright MRN: [id number] Date of Birth: [birth year]  Today's Date: 07/31/2019 PT Individual Time: 8676-1950 PT Individual Time Calculation (min): 68 min   Short Term Goals: Week 1:  PT Short Term Goal 1 (Week 1): Pt will be able to perform functional bed mobility with min assist PT Short Term Goal 2 (Week 1): Pt will be able to perform basic transfers with min assist PT Short Term Goal 3 (Week 1): Pt will be able to gait x 50' with min assist PT Short Term Goal 4 (Week 1): Pt will be able to initiate stair training  Skilled Therapeutic Interventions/Progress Updates:  Pt received in w/c with RN present administering medication. Assisted pt with donning B shoes & L AFO total assist for time management & transported pt to gym via w/c dependent assist for time management. Pt requires min assist for sit<>stand transfers with focus on scooting out to edge of seat, BLE foot placement (wide BOS with L foot flat), hand placement and anterior weight shifting. Pt ambulates 50 ft + 150 ft with RW & min assist with cuing to slow down & small steps when turning. While standing EOM pt engaged in reaching across midline to obtain then toss bean bags while playing corn hole, without BUE support & min assist for balance. From EOM, pt completed blocked practice of sit<>stands with min assist & RW with ongoing cuing for technique (pt completed 5 reps).  Pt transferred to supine on mat table on wedge & pt performed BLE bridging with cuing for LLE placement and technique, as well as exhalation with exertion, with task focusing on BLE hip extensor strengthening; progressed to pt performing task with hold at the top. Pt ambulates dayroom>room with RW & min assist with increasing LLE foot drag and impaired balance with turns as pt fatigues. At end of session pt left in w/c with chair alarm donned & all needs at hand.  Therapy Documentation Precautions:   Precautions Precautions: Fall Precaution Comments: PEG; NPO Required Braces or Orthoses: Other Brace Other Brace: L AFO Restrictions Weight Bearing Restrictions: No  Pain: No c/o pain reported during session.    Therapy/Group: Individual Therapy  Waunita Schooner 07/31/2019, 2:17 PM

## 2019-07-31 NOTE — Progress Notes (Signed)
[facility] PHYSICAL MEDICINE & REHABILITATION PROGRESS NOTE  Subjective/Complaints: Still coughing at night. Suctioning/coughing out.  Left shoulder sore, worse since being re-hospitalized  ROS: Patient denies fever, rash, sore throat, blurred vision, nausea, vomiting, diarrhea,   shortness of breath or chest pain,  headache, or mood change.     Objective: Vital Signs: Blood pressure 135/78, pulse 90, temperature 98 F (36.7 C), resp. rate 19, height 6' (1.829 m), weight (!) 136.8 kg, SpO2 97 %. No results found. Recent Labs    07/31/19 0646  WBC 7.2  HGB 12.0*  HCT 36.7*  PLT 304   Recent Labs    07/31/19 0646  NA 135  K 4.2  CL 101  CO2 25  GLUCOSE 186*  BUN 18  CREATININE 0.72  CALCIUM 8.7*    Physical Exam: BP 135/78 (BP Location: Right Arm)   Pulse 90   Temp 98 F (36.7 C)   Resp 19   Ht 6' (1.829 m)   Wt (!) 136.8 kg   SpO2 97%   BMI 40.90 kg/m  Constitutional: No distress . Vital signs reviewed. obese HEENT: EOMI, oral membranes moist Neck: supple Cardiovascular: RRR without murmur. No JVD    Respiratory: a few rhonchi without wheezes or rales. Normal effort    GI: BS +, non-tender, non-distended PEG in place Skin: See above. Psych: Normal mood.  Normal behavior. Musc: Lower extremity edema.   No signs of nerve impingement at shoulder, no limitations with PROM of left shoulder but pain with AROM, some anterior pain. Neuro: Alert Motor: Grossly 4+/5 throughout, except for left shoulder flexion/abduction (some limitation due to pain), stable Dysphonia with wet voice, but able to clear.   Assessment/Plan: 1. Functional deficits secondary to debility which require 3+ hours per day of interdisciplinary therapy in a comprehensive inpatient rehab setting.  Physiatrist is providing close team supervision and 24 hour management of active medical problems listed below.  Physiatrist and rehab team continue to assess barriers to discharge/monitor patient  progress toward functional and medical goals  Care Tool:  Bathing    Body parts bathed by patient: Right arm, Left arm, Abdomen, Chest, Right upper leg, Left upper leg, Face   Body parts bathed by helper: Front perineal area, Buttocks, Right lower leg, Left lower leg     Bathing assist Assist Level: Moderate Assistance - Patient 50 - 74%     Upper Body Dressing/Undressing Upper body dressing   What is the patient wearing?: Pull over shirt    Upper body assist Assist Level: Minimal Assistance - Patient > 75%    Lower Body Dressing/Undressing Lower body dressing      What is the patient wearing?: Pants     Lower body assist Assist for lower body dressing: Maximal Assistance - Patient 25 - 49%     Toileting Toileting    Toileting assist Assist for toileting: Maximal Assistance - Patient 25 - 49%     Transfers Chair/bed transfer  Transfers assist  Chair/bed transfer activity did not occur: Safety/medical concerns  Chair/bed transfer assist level: Moderate Assistance - Patient 50 - 74%     Locomotion Ambulation   Ambulation assist      Assist level: Minimal Assistance - Patient > 75% Assistive device: Walker-rolling Max distance: 50 ft   Walk 10 feet activity   Assist     Assist level: Minimal Assistance - Patient > 75% Assistive device: Walker-rolling   Walk 50 feet activity   Assist Walk 50 feet with  2 turns activity did not occur: Safety/medical concerns  Assist level: Minimal Assistance - Patient > 75% Assistive device: Walker-rolling    Walk 150 feet activity   Assist Walk 150 feet activity did not occur: Safety/medical concerns         Walk 10 feet on uneven surface  activity   Assist Walk 10 feet on uneven surfaces activity did not occur: Safety/medical concerns         Wheelchair     Assist Will patient use wheelchair at discharge?: Yes Type of Wheelchair: Manual    Wheelchair assist level: Supervision/Verbal  cueing Max wheelchair distance: 100 ft    Wheelchair 50 feet with 2 turns activity    Assist        Assist Level: Supervision/Verbal cueing   Wheelchair 150 feet activity     Assist Wheelchair 150 feet activity did not occur: Safety/medical concerns         Medical Problem List and Plan: 1. Debility secondary to septic shock from UTI and Klebsiella pneumonia.bacteremia with rhinovirus.  Continue CIR tolerating PT, OT  Cont SLP for swallow  2. Antithrombotics: -DVT/anticoagulation:Pharmaceutical:Heparin -antiplatelet therapy: ASA/Plavix. 3. Pain Management:tylenol prn.  Continue Lidoderm patch for left shoulder  Trial Robaxin 500 3 times daily as needed on 10/9  -add kpad also  -no major rtc signs on exam 4. Mood:LCSW to follow for evaluation and support. -antipsychotic agents: N/A 5. Neuropsych: This patientiscapable of making decisions on hisown behalf. 6. Skin/Wound Care:Routine pressure relief measures. 7. Fluids/Electrolytes/Nutrition:N.p.o.  Tube feeds qid with water flushes.  8. Klebsiella PNA bacteremia/UTI: On Cefdinir IV until 08/02/2019. 9. 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   Added water flushes  -encouraged sitting up to cough, OOB 11. T2DM with hyperglycemia:Monitor BSevery 4 hours with SSI and May need to resume lantus  Lantus 5 units daily started on 10/9--inccrease to 10u CBG (last 3)  Recent Labs    07/30/19 2125 07/31/19 0100 07/31/19 0438  GLUCAP 189* 189* 163*     12 HTN: Monitor BP tid--currently controlled off medications.  Controlled on 10/12  Monitor with increased  mobility Vitals:   07/31/19 0455 07/31/19 0846  BP: 135/78   Pulse: 90   Resp: 19   Temp: 98 F (36.7 C)   SpO2: 98% 97%   13. OSA: Continue CPAP whenever asleep.  14. Anemia of chronic illness:   Hemoglobin 11.6 on 10/8, 12.0 10/12  Continue to monitor  15.  Leukocytosis:  WBCs down to 7.2 10/12  Afebrile  Continue to monitor  16. Urinary retention:   Off urecholine at this time.   Continue proscar.   Cannot use flomax because cannot be crushed.  PVRs low, oob to void 17.  Morbidly obese  Encouraged weight loss  LOS: 5 days A FACE TO FACE EVALUATION WAS PERFORMED  Ranelle Oyster 07/31/2019, 10:26 AM

## 2019-07-31 NOTE — Progress Notes (Signed)
Patient is able to place himself on/off CPAP. Patient placed himself on CPAP after breathing treatment. RT will monitor as needed.

## 2019-08-01 ENCOUNTER — Inpatient Hospital Stay (HOSPITAL_COMMUNITY): Payer: Medicare Other | Admitting: Physical Therapy

## 2019-08-01 ENCOUNTER — Inpatient Hospital Stay (HOSPITAL_COMMUNITY): Payer: Medicare Other | Admitting: Speech Pathology

## 2019-08-01 ENCOUNTER — Inpatient Hospital Stay (HOSPITAL_COMMUNITY): Payer: Medicare Other | Admitting: Occupational Therapy

## 2019-08-01 ENCOUNTER — Inpatient Hospital Stay (HOSPITAL_COMMUNITY): Payer: Medicare Other

## 2019-08-01 LAB — GLUCOSE, CAPILLARY
Glucose-Capillary: 131 mg/dL — ABNORMAL HIGH (ref 70–99)
Glucose-Capillary: 187 mg/dL — ABNORMAL HIGH (ref 70–99)
Glucose-Capillary: 198 mg/dL — ABNORMAL HIGH (ref 70–99)
Glucose-Capillary: 228 mg/dL — ABNORMAL HIGH (ref 70–99)
Glucose-Capillary: 259 mg/dL — ABNORMAL HIGH (ref 70–99)

## 2019-08-01 NOTE — Progress Notes (Signed)
Physical Therapy Session Note  Patient Details  Name: Michael Bright MRN: [id number] Date of Birth: [birth year]  Today's Date: 08/01/2019 PT Individual Time: 1400-1427 PT Individual Time Calculation (min): 27 min   Short Term Goals: Week 1:  PT Short Term Goal 1 (Week 1): Pt will be able to perform functional bed mobility with min assist PT Short Term Goal 1 - Progress (Week 1): Met PT Short Term Goal 2 (Week 1): Pt will be able to perform basic transfers with min assist PT Short Term Goal 2 - Progress (Week 1): Met PT Short Term Goal 3 (Week 1): Pt will be able to gait x 50' with min assist PT Short Term Goal 3 - Progress (Week 1): Met PT Short Term Goal 4 (Week 1): Pt will be able to initiate stair training PT Short Term Goal 4 - Progress (Week 1): Met Week 2:  PT Short Term Goal 1 (Week 2): STG = LTG due to estimated d/c date.  Skilled Therapeutic Interventions/Progress Updates:    Session focused on blocked practice sit <> stands from recliner with RW with overall min assist and 2 x with CGA with cues for anterior weightshift and adjusting L foot positioning to increase WB and forced use through LLE. Pt demonstrates good carryover of technique for hand placement without cues needed. Gait training x 100', x 45', and x 10' with CGA to min assist with RW and cues for upright posture and better positioning of RW. Pt does require min assist for balance during turns. 10' of retro gait with min assist and verbal cues to keep RW closer to body to decrease flexed positioning.   Therapy Documentation Precautions:  Precautions Precautions: Fall Precaution Comments: PEG; NPO Required Braces or Orthoses: Other Brace Other Brace: L AFO Restrictions Weight Bearing Restrictions: No   Vital Signs: Pulse Rate: 92-113 bpm with activity Oxygen Therapy SpO2: 92-96 % (one episode dropped to 88% after gait but resumed to > 90% within 5 sec) O2 Device: Room Air Pain: Reports pain in L shoulder is  the "same". No intervention needed at this time.   Therapy/Group: Individual Therapy  Canary Brim Ivory Broad, PT, DPT, CBIS  08/01/2019, 3:15 PM

## 2019-08-01 NOTE — Progress Notes (Signed)
Occupational Therapy Session Note  Patient Details  Name: Michael Bright MRN: [id number] Date of Birth: Jan 08, 1956  Today's Date: 08/01/2019 OT Individual Time: 1045-1130 OT Individual Time Calculation (min): 45 min    Short Term Goals: Week 1:  OT Short Term Goal 1 (Week 1): Pt will complete 1 grooming task in standing OT Short Term Goal 2 (Week 1): Pt will perform sit to stands for clothing mangement with min A OT Short Term Goal 3 (Week 1): Pt will complete toileting tasks with min A OT Short Term Goal 4 (Week 1): Pt will transfer to the toilet/ BSC with min A consistently  Skilled Therapeutic Interventions/Progress Updates:    Treatment session with focus on sit <> stand and LUE NMR.  Pt received seated in recliner requesting to focus on "getting up and down".  Pt required mod assist sit > stand initially from recliner.  CGA for all other sit <> stand.  Engaged in gross and fine motor activity in standing with focus on standing tolerance and upright posture. Provided intermittent tactile and verbal cues for upright posture as pt with tendency to lean over.  Utilized LUE during reaching tasks and fine motor tasks with focus on increased ROM and motor control.  Provided pt with fine motor activities, provided written description.  Pt able to complete each task with stacking, in-hand manipulation, and translation with increased time and dropping approx 20% of time.    Therapy Documentation Precautions:  Precautions Precautions: Fall Precaution Comments: PEG; NPO Required Braces or Orthoses: Other Brace Other Brace: L AFO Restrictions Weight Bearing Restrictions: No General:   Vital Signs: Therapy Vitals Temp: 98.6 F (37 C) Temp Source: Oral Pulse Rate: 92 Resp: 20 BP: 126/70 Oxygen Therapy SpO2: 96 % O2 Device: Room Air Pain:  Pt with no c/o pain   Therapy/Group: Individual Therapy  Simonne Come 08/01/2019, 3:03 PM

## 2019-08-01 NOTE — Progress Notes (Signed)
Speech Language Pathology Daily Session Note  Patient Details  Name: Michael Bright MRN: [id number] Date of Birth: 07-28-1956  Today's Date: 08/01/2019 SLP Individual Time: 1235-1315 SLP Individual Time Calculation (min): 40 min  Short Term Goals: Week 1: SLP Short Term Goal 1 (Week 1): Pt will utilizie memory compensatory strategies to recall new, daily information with min A verbal cues. SLP Short Term Goal 2 (Week 1): Pt will perform throat clear and swallow to reduce wet vocal quality with min A verbal cues. SLP Short Term Goal 3 (Week 1): Pt will consume ice chip trials to preserve swallow function with moderate s/s aspiration. SLP Short Term Goal 4 (Week 1): Patient will perform pharyngeal strengthening exercises with Min verbal and visual cues.  Skilled Therapeutic Interventions: Skilled treatment session focused on dysphagia goals. Upon arrival, patient was upright in the recliner with a severe wet vocal quality. With Mod verbal cues, patient able to reduce wet vocal quality with cued throat clears. Patient performed pharyngeal strengthening exercises X 10 with Min verbal and tactile cues needed for accuracy. Patient independently performed oral care via the suction toothbrush and consumed trials of ice chips with consistent subtle cough/throat clear. Recommend patient remain NPO. Patient left upright in recliner with all needs within reach. Continue with current plan of care.      Pain No/Denies Pain  Therapy/Group: Individual Therapy  Shakura Cowing 08/01/2019, 1:21 PM

## 2019-08-01 NOTE — Patient Care Conference (Signed)
Inpatient RehabilitationTeam Conference and Plan of Care Update Date: 08/01/2019   Time: 10:05 AM    Patient Name: Michael Bright      Medical Record Number: [id number]  Date of Birth: 1956/07/02 Sex: Male         Room/Bed: 4W09C/4W09C-01 Payor Info: Payor: [place] / Plan: BCBS COMM PPO / Product Type: *No Product type* /    Admit Date/Time:  07/26/2019  6:42 PM  Primary Diagnosis:  Debility  Patient Active Problem List   Diagnosis Date Noted  . Morbidly obese ([place])   . Urinary retention   . Leukocytosis   . Anemia of chronic disease   . Controlled type 2 diabetes mellitus with hyperglycemia ([place])   . Septic shock ([place]) 07/20/2019  . Sepsis with acute renal failure without septic shock ([place])   . Urinary tract infection without hematuria   . Acute blood loss anemia   . Dysphagia due to recent cerebral infarction   . Diabetes mellitus type 2 in obese ([place])   . Morbid obesity ([place])   . Status post insertion of percutaneous endoscopic gastrostomy (PEG) tube ([place])   . 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 Aspiration pneumonia ([place]) 06/14/2019  . Essential hypertension 06/14/2019  . Type 2 diabetes mellitus with hyperlipidemia ([place]) 06/14/2019  . Acute respiratory failure ([place])   . Acute CVA (cerebrovascular accident) ([facility]) 05/30/2019  . 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 Dysphagia 05/30/2019  . Renal insufficiency 05/30/2019  . Diplopia   . Debility     Expected Discharge Date: Expected Discharge Date: 08/08/19  Team Members Present: Physician leading conference: Dr. Alger Simons Social Worker Present: Lennart Pall, LCSW Nurse Present: Sandrea Hammond, LPN PT Present: Lavone Nian, PT OT Present: Laverle Hobby, OT SLP Present: Weston Anna, SLP PPS Coordinator present : Gunnar Fusi, SLP     Current Status/Progress Goal Weekly Team Focus  Bowel/Bladder   continent of bowel and Bladder q4-6 PVR  LBM 10/11  Encourge time toileting  assess toileting needs qshift and PRN   Swallow/Nutrition/ Hydration   NPO with PEG  Mod I for participation in dysphagia treatment  Trials of ice chips, pharyngeal strengthening exercises, RMT   ADL's   CGA-min A overall for ADLs and transfers. SpO2 remaining > 95% on RA  CGA overall with ADLs and transfers  ADL retraining, functional activity tolerance/cardiorespiratory support, ADL transfers   Mobility   min<>max assist sit<>stand, min assist gait up to 50 ft with RW  CGA<>min assist overall  strengthening, transfers, gait, balance, endurance, d/c planning, pt education, NMR   Communication             Safety/Cognition/ Behavioral Observations  Min A  Supervision  use of strategies for recall   Pain   c/o pain to left upper rm and shoulder. managed with Lidocaine patch, muscle rub and PRN tylenol  pain less than 3  assess pain qshift and PRN   Skin   no skin issues peg tube in place site unemarkable.  maintain skin integrity,free of infection  assess skin qshift and PRN    Rehab Goals Patient on target to meet rehab goals: Yes *See Care Plan and progress notes for long and short-term goals.     Barriers to Discharge  Current Status/Progress Possible Resolutions Date Resolved   Nursing                  PT  OT                  SLP                SW                Discharge Planning/Teaching Needs:  Home with wife and children who can assist.  Teaching completed from prior CIR stay   Team Discussion:  Returning patient after infection and decline.  Still with chronic coughing due to poor pharyngeal strength.  BSs not controlled and need PVRs q 6hr.  Overall with PT/OT at [facility] - min assist with CGA goals.  Swallow remians impaired.  ST to begin RMT.  ST asking about vital- stim and MD ok'd.  Mild memory issues.  Revisions to Treatment Plan:  NA    Medical Summary Current Status: readmitted with URI/urosepsis,  debility. CBG's high Weekly Focus/Goal: improve dm control. cough/clearance of secretions, TF  Barriers to Discharge: Medical stability       Continued Need for Acute Rehabilitation Level of Care: The patient requires daily medical management by a physician with specialized training in physical medicine and rehabilitation for the following reasons: Direction of a multidisciplinary physical rehabilitation program to maximize functional independence : Yes Medical management of patient stability for increased activity during participation in an intensive rehabilitation regime.: Yes Analysis of laboratory values and/or radiology reports with any subsequent need for medication adjustment and/or medical intervention. : Yes   I attest that I was present, lead the team conference, and concur with the assessment and plan of the team.   [name] 08/01/2019, 11:22 AM   Team conference was held via web/ teleconference due to COVID - 19

## 2019-08-01 NOTE — Progress Notes (Signed)
[facility] PHYSICAL MEDICINE & REHABILITATION PROGRESS NOTE  Subjective/Complaints: Had a good morning with therapy. Still working on strength. Able to cough up secretions more.   ROS: Patient denies fever, rash, sore throat, blurred vision, nausea, vomiting, diarrhea,  chest pain, joint or back pain, headache, or mood change.     Objective: Vital Signs: Blood pressure (!) 142/82, pulse 82, temperature 98.3 F (36.8 C), resp. rate 18, height 6' (1.829 m), weight (!) 136.1 kg, SpO2 97 %. No results found. Recent Labs    07/31/19 0646  WBC 7.2  HGB 12.0*  HCT 36.7*  PLT 304   Recent Labs    07/31/19 0646  NA 135  K 4.2  CL 101  CO2 25  GLUCOSE 186*  BUN 18  CREATININE 0.72  CALCIUM 8.7*    Physical Exam: BP (!) 142/82 (BP Location: Right Arm)   Pulse 82   Temp 98.3 F (36.8 C)   Resp 18   Ht 6' (1.829 m)   Wt (!) 136.1 kg   SpO2 97%   BMI 40.69 kg/m  Constitutional: No distress . Vital signs reviewed. HEENT: EOMI, oral membranes moist Neck: supple Cardiovascular: RRR without murmur. No JVD    Respiratory: CTA Bilaterally without wheezes or rales. Normal effort . No rhonchi  GI: BS +, non-tender, non-distended PEG in place Skin: See above. Psych: Normal mood.  Normal behavior. Musc: Lower extremity edema.   Left shoulder tender with AROM. No impingement signs.  Neuro: Alert Motor: Grossly 4+/5 throughout, except for left shoulder flexion/abduction (some limitation due to pain), stable Dysphonia   Assessment/Plan: 1. Functional deficits secondary to debility which require 3+ hours per day of interdisciplinary therapy in a comprehensive inpatient rehab setting.  Physiatrist is providing close team supervision and 24 hour management of active medical problems listed below.  Physiatrist and rehab team continue to assess barriers to discharge/monitor patient progress toward functional and medical goals  Care Tool:  Bathing    Body parts bathed by  patient: Right arm, Left arm, Abdomen, Chest, Right upper leg, Left upper leg, Face   Body parts bathed by helper: Front perineal area, Buttocks, Right lower leg, Left lower leg     Bathing assist Assist Level: Moderate Assistance - Patient 50 - 74%     Upper Body Dressing/Undressing Upper body dressing   What is the patient wearing?: Pull over shirt    Upper body assist Assist Level: Minimal Assistance - Patient > 75%    Lower Body Dressing/Undressing Lower body dressing      What is the patient wearing?: Pants     Lower body assist Assist for lower body dressing: Maximal Assistance - Patient 25 - 49%     Toileting Toileting    Toileting assist Assist for toileting: Maximal Assistance - Patient 25 - 49%     Transfers Chair/bed transfer  Transfers assist  Chair/bed transfer activity did not occur: Safety/medical concerns  Chair/bed transfer assist level: Minimal Assistance - Patient > 75%     Locomotion Ambulation   Ambulation assist      Assist level: Minimal Assistance - Patient > 75% Assistive device: Walker-rolling Max distance: 100 ft   Walk 10 feet activity   Assist     Assist level: Minimal Assistance - Patient > 75% Assistive device: Walker-rolling   Walk 50 feet activity   Assist Walk 50 feet with 2 turns activity did not occur: Safety/medical concerns  Assist level: Minimal Assistance - Patient > 75% Assistive  device: Walker-rolling    Walk 150 feet activity   Assist Walk 150 feet activity did not occur: Safety/medical concerns  Assist level: Minimal Assistance - Patient > 75% Assistive device: Walker-rolling    Walk 10 feet on uneven surface  activity   Assist Walk 10 feet on uneven surfaces activity did not occur: Safety/medical concerns         Wheelchair     Assist Will patient use wheelchair at discharge?: Yes Type of Wheelchair: Manual    Wheelchair assist level: Supervision/Verbal cueing Max wheelchair  distance: 100 ft    Wheelchair 50 feet with 2 turns activity    Assist        Assist Level: Supervision/Verbal cueing   Wheelchair 150 feet activity     Assist Wheelchair 150 feet activity did not occur: Safety/medical concerns         Medical Problem List and Plan: 1. Debility secondary to septic shock from UTI and Klebsiella pneumonia.bacteremia with rhinovirus.  Continue CIR tolerating PT, OT   Cont SLP for swallow   Team conference 2. Antithrombotics: -DVT/anticoagulation:Pharmaceutical:Heparin -antiplatelet therapy: ASA/Plavix. 3. Pain Management:tylenol prn.  Continue Lidoderm patch for left shoulder  Trial Robaxin 500 3 times daily as needed on 10/9  -added kpad also  -no major rtc signs on exam 4. Mood:LCSW to follow for evaluation and support. -antipsychotic agents: N/A 5. Neuropsych: This patientiscapable of making decisions on hisown behalf. 6. Skin/Wound Care:Routine pressure relief measures. 7. Fluids/Electrolytes/Nutrition:N.p.o.  Tube feeds qid with water flushes.  8. Klebsiella PNA bacteremia/UTI: On Cefdinir IV until 08/02/2019. 9. Bilateral air space disease/Rhino virus: On Pulmicort bid,guifenesin qid (Mucinexcannot be crushed), duo nebs qid  10. Medullary stroke with severe dysphagia with oral secretions  -resumed Robinul 1 mg tid, increased to 2 mg 3 times daily on 10/9.  -Continue NPO with tube feedson osmolyte     bolus tube feeds.   Added water flushes  -encouraged sitting up to cough, OOB 11. T2DM with hyperglycemia:Monitor BSevery 4 hours with SSI and May need to resume lantus  Lantus 5 units daily started on 10/9--increased to 10u effective today. Will likely need further titration CBG (last 3)  Recent Labs    07/31/19 2001 08/01/19 0000 08/01/19 0411  GLUCAP 222* 198* 259*     12 HTN: Monitor BP tid--currently controlled off medications.  Controlled on 10/13  Monitor with  increased mobility Vitals:   08/01/19 0407 08/01/19 0718  BP: (!) 142/82   Pulse: 89 82  Resp: 18 18  Temp: 98.3 F (36.8 C)   SpO2: 99% 97%   13. OSA: Continue CPAP whenever asleep.  14. Anemia of chronic illness:   Hemoglobin 11.6 on 10/8, 12.0 10/12  Continue to monitor  15.  Leukocytosis:  WBCs down to 7.2 10/12  Afebrile  Continue to monitor  16. Urinary retention:   Off urecholine at this time.   Continue proscar.   Cannot use flomax because cannot be crushed.  PVRs low, oob to void 17.  Morbidly obese  Encouraged weight loss  LOS: 6 days A FACE TO FACE EVALUATION WAS PERFORMED  Meredith Staggers 08/01/2019, 9:51 AM

## 2019-08-01 NOTE — Progress Notes (Signed)
Physical Therapy Weekly Progress Note  Patient Details  Name: Michael Bright MRN: [id number] Date of Birth: Mar 12, 1956  Beginning of progress report period: July 27, 2019 End of progress report period: August 01, 2019  Today's Date: 08/01/2019  Patient has met 4 of 4 short term goals.  Pt is making good progress towards LTG's, and currently requires min assist for gait with RW & min assist for sit<>stand transfers with max cuing for overall technique. Pt continues to be limited by LLE weakness & impaired coordination 2/2 medical event leading to first admission. Pt is very motivated to participate & return home. Pt would benefit from continued skilled PT treatment to focus on transfers, gait, balance, stair negotiation, strengthening, endurance, and pt education prior to d/c home.  Patient continues to demonstrate the following deficits muscle weakness, decreased cardiorespiratoy endurance, decreased coordination, and decreased standing balance, decreased postural control and decreased balance strategies and therefore will continue to benefit from skilled PT intervention to increase functional independence with mobility.  Patient progressing toward long term goals..  Continue plan of care.  PT Short Term Goals Week 1:  PT Short Term Goal 1 (Week 1): Pt will be able to perform functional bed mobility with min assist PT Short Term Goal 1 - Progress (Week 1): Met PT Short Term Goal 2 (Week 1): Pt will be able to perform basic transfers with min assist PT Short Term Goal 2 - Progress (Week 1): Met PT Short Term Goal 3 (Week 1): Pt will be able to gait x 50' with min assist PT Short Term Goal 3 - Progress (Week 1): Met PT Short Term Goal 4 (Week 1): Pt will be able to initiate stair training PT Short Term Goal 4 - Progress (Week 1): Met Week 2:  PT Short Term Goal 1 (Week 2): STG = LTG due to estimated d/c date.   Therapy Documentation Precautions:  Precautions Precautions:  Fall Precaution Comments: PEG; NPO Required Braces or Orthoses: Other Brace Other Brace: L AFO Restrictions Weight Bearing Restrictions: No   Therapy/Group: Individual Therapy  Waunita Schooner 08/01/2019, 10:23 AM

## 2019-08-01 NOTE — Plan of Care (Signed)
  Problem: Consults Goal: RH GENERAL PATIENT EDUCATION Description: See Patient Education module for education specifics. Outcome: Progressing   Problem: RH BOWEL ELIMINATION Goal: RH STG MANAGE BOWEL WITH ASSISTANCE Description: STG Manage Bowel with Mod I Assistance. Outcome: Progressing Goal: RH STG MANAGE BOWEL W/MEDICATION W/ASSISTANCE Description: STG Manage Bowel with Medication with Mod I Assistance. Outcome: Progressing   Problem: RH BLADDER ELIMINATION Goal: RH STG MANAGE BLADDER WITH ASSISTANCE Description: STG Manage Bladder With Mod I Assistance Outcome: Progressing Goal: RH STG MANAGE BLADDER WITH MEDICATION WITH ASSISTANCE Description: STG Manage Bladder With Medication With Mod I Assistance. Outcome: Progressing   Problem: RH SAFETY Goal: RH STG ADHERE TO SAFETY PRECAUTIONS W/ASSISTANCE/DEVICE Description: STG Adhere to Safety Precautions With Mod I Assistance/Device. Outcome: Progressing Goal: RH STG DECREASED RISK OF FALL WITH ASSISTANCE Description: STG Decreased Risk of Fall With Mod I Assistance. Outcome: Progressing   Problem: RH PAIN MANAGEMENT Goal: RH STG PAIN MANAGED AT OR BELOW PT'S PAIN GOAL Description: <2 Outcome: Progressing

## 2019-08-01 NOTE — Progress Notes (Signed)
Physical Therapy Session Note  Patient Details  Name: Michael Bright MRN: [id number] Date of Birth: 1956/04/03  Today's Date: 08/01/2019 PT Individual Time: 0730-0842 PT Individual Time Calculation (min): 72 min   Short Term Goals: Week 1:  PT Short Term Goal 1 (Week 1): Pt will be able to perform functional bed mobility with min assist PT Short Term Goal 2 (Week 1): Pt will be able to perform basic transfers with min assist PT Short Term Goal 3 (Week 1): Pt will be able to gait x 50' with min assist PT Short Term Goal 4 (Week 1): Pt will be able to initiate stair training  Skilled Therapeutic Interventions/Progress Updates:  Pt received in bed & agreeable to tx. Pt transfers to sitting EOB mod I with hospital bed features. Therapist assisted pt with donning pants, ted hose, L AFO, & B shoes for time management. Pt dons clean shirt with set up assist. Pt transfers sit>stand from very elevated EOB and ambulates short distance to w/c with RW & min assist with 1 episode of L knee buckling. In dayroom, pt transfers sit<>stand with min assist with ongoing cuing for BLE/BUE placement and anterior weight shifting. Pt ambulates around nurses station with RW & min assist (~100 ft x 2) with cuing for safety during turns.  Pt transitioned to tall kneeling on EOM but only able to maintain position ~1 minute + 2 minutes + 2 minutes with BUE support on stool with cuing for upright posture with task focusing on hip extensor strengthening; pt progressed to raising alternating UE to lessen support on upper body during task. Pt with 2 instances of L knee buckling when transitioning from EOM to w/c for rest break. In gym, pt attempts to negotiate steps with B rails and cuing for compensatory pattern but pt only negotiates 1 step (6") with mod assist with pt experiencing significant L knee buckling but able to correct with BUE support on rails & assistance from therapist. Pt returned to w/c and returned to room. Pt  ambulates around bed to recliner with RW & min assist. Pt left in recliner with chair alarm donned & all needs in reach.  Therapy Documentation Precautions:  Precautions Precautions: Fall Precaution Comments: PEG; NPO Required Braces or Orthoses: Other Brace Other Brace: L AFO Restrictions Weight Bearing Restrictions: No   Pain:  Pt reports unrated L shoulder/LUE with movement; pt reports he's premedicated prior to PT session.    Therapy/Group: Individual Therapy  Waunita Schooner 08/01/2019, 8:44 AM

## 2019-08-02 ENCOUNTER — Ambulatory Visit: Payer: Medicare Other | Admitting: Primary Care

## 2019-08-02 ENCOUNTER — Inpatient Hospital Stay (HOSPITAL_COMMUNITY): Payer: Medicare Other | Admitting: Physical Therapy

## 2019-08-02 ENCOUNTER — Inpatient Hospital Stay (HOSPITAL_COMMUNITY): Payer: Medicare Other

## 2019-08-02 ENCOUNTER — Inpatient Hospital Stay (HOSPITAL_COMMUNITY): Payer: Medicare Other | Admitting: Speech Pathology

## 2019-08-02 LAB — GLUCOSE, CAPILLARY
Glucose-Capillary: 160 mg/dL — ABNORMAL HIGH (ref 70–99)
Glucose-Capillary: 160 mg/dL — ABNORMAL HIGH (ref 70–99)
Glucose-Capillary: 159 mg/dL — ABNORMAL HIGH (ref 70–99)
Glucose-Capillary: 231 mg/dL — ABNORMAL HIGH (ref 70–99)

## 2019-08-02 MED ORDER — INSULIN GLARGINE 100 UNIT/ML ~~LOC~~ SOLN
15.0000 [IU] | Freq: Every day | SUBCUTANEOUS | Status: DC
  Administered 2019-08-03 – 2019-08-06 (×4): 15 [IU] via SUBCUTANEOUS
  Filled 2019-08-02 (×4): qty 0.15

## 2019-08-02 MED ORDER — OSMOLITE 1.5 CAL PO LIQD
325.0000 mL | Freq: Three times a day (TID) | ORAL | Status: DC
  Administered 2019-08-02 – 2019-08-08 (×25): 325 mL
  Filled 2019-08-02 (×11): qty 474

## 2019-08-02 MED ORDER — PRO-STAT SUGAR FREE PO LIQD
60.0000 mL | Freq: Two times a day (BID) | ORAL | Status: DC
  Administered 2019-08-02 – 2019-08-08 (×12): 60 mL
  Filled 2019-08-02 (×12): qty 60

## 2019-08-02 NOTE — Progress Notes (Signed)
Evaluated pt for difficulty clearing secretions.  Pt states that he has trouble coughing secretions up.  Pt instructed on use of the flutter valve and huff cough.  Pt able to clear secretions more effectively after instructions.  NTS not needed at this time.

## 2019-08-02 NOTE — Progress Notes (Signed)
Nutrition Follow-up  DOCUMENTATION CODES:   Morbid obesity  INTERVENTION:   Bolus tube feeding via PEG: - Osmolite 1.5 cal 325 ml QID - Pro-stat 60 ml BID - Free water per MD/PA, currently 200 ml TID  Tube feeding regimen and current free water provides 2350 kcal, 142 grams of protein, and 1591 ml of H2O.   NUTRITION DIAGNOSIS:   Inadequate oral intake related to inability to eat as evidenced by NPO status.  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 Pt underwent comprehensive rehab 9/08-9/28 due to functional decline. Pt tolerated decannulation by 9/22. He was discharged to home. Pt was readmitted on 07/20/19 with dyspnea with increased WOB requiring intubation and was found to have septic shock with hypotension and AKI. Pt was treated with broad spectrum antibiotics with IVF and found to have Klebsiella pneumoniae bacteremia with UTI and bilateral lung infiltrates due to rhino virus. Pt tolerated extubation by 10/02. Pt admitted to CIR on 10/07.  Weight stable.  Spoke with pt at bedside. Pt denies any issues with TF and states he is tolerating it well.  Current TF: Osmolite 1.5 cal 310 ml QID, Pro-stat 30 ml 5 x daily, free water 200 ml q 8 hours  Medications reviewed and include: SSI, Lantus 15 units daily  Labs reviewed. CBG's: 131-228 x 24 hours  UOP: 1900 ml x 24 hours  Diet Order:   Diet Order            Diet NPO time specified  Diet effective now              EDUCATION NEEDS:   No education needs have been identified at this time  Skin:  Skin Assessment: Reviewed RN Assessment(MASD to groin)  Last BM:  07/30/19  Height:   Ht Readings from Last 1 Encounters:  08/01/19 6' (1.829 m)    Weight:   Wt Readings from Last 1 Encounters:  08/02/19 (!) 136.2 kg    Ideal Body Weight:  80.9 kg  BMI:  Body mass index is 40.73 kg/m.  Estimated Nutritional Needs:   Kcal:  2200-2400  Protein:  130-150 grams  Fluid:  >/= 2.0 L    Gaynell Face, MS, RD, LDN Inpatient Clinical Dietitian Pager: [phone] Weekend/After Hours: [phone]

## 2019-08-02 NOTE — Progress Notes (Signed)
Physical Therapy Session Note  Patient Details  Name: Michael Bright MRN: [id number] Date of Birth: 08-14-1956  Today's Date: 08/02/2019 PT Individual Time: 1005-1100 PT Individual Time Calculation (min): 55 min   Short Term Goals: Week 2:  PT Short Term Goal 1 (Week 2): STG = LTG due to estimated d/c date.  Skilled Therapeutic Interventions/Progress Updates:  Pt received in w/c & agreeable to tx. Pt reports unrated pain in LUE & states he's premedicated with tylenol, as he does not want muscle relaxer at this time. In gym, pt transfers sit<>stand with min assist with ongoing cuing for LLE placement for wide BOS and pt self correcting hand placement. Pt ambulates 100 ft + 100 ft with RW & L AFO with min assist with pt demonstrating slightly more L lateral lean & L knee weakness on this date. From elevated EOM pt performed sit<>stands (6 reps + 5 reps + 6 reps) with lowering seat height without BUE support with focus on BLE placement for wide BOS, anterior weight shifting & activating hip extensors to come to upright posture, with pt requiring min assist overall. Pt performed standing cone taps with BUE support on RW & min assist for balance with task focusing on weight shifting L<>R, dynamic balance, LLE NMR & coordination, and standing tolerance. Pt ambulates dayroom>room with RW & min assist with cuing to ambulate within base of AD when turning into room. Pt left in w/c with chair alarm donned, all needs in reach.  Therapy Documentation Precautions:  Precautions Precautions: Fall Precaution Comments: PEG; NPO Required Braces or Orthoses: Other Brace Other Brace: L AFO Restrictions Weight Bearing Restrictions: No    Therapy/Group: Individual Therapy  Waunita Schooner 08/02/2019, 11:05 AM

## 2019-08-02 NOTE — Progress Notes (Signed)
Speech Language Pathology Daily Session Note  Patient Details  Name: Michael Bright MRN: [id number] Date of Birth: [birth year]  Today's Date: 08/02/2019 SLP Individual Time: 05-1504 SLP Individual Time Calculation (min): 30 min  Short Term Goals: Week 1: SLP Short Term Goal 1 (Week 1): Pt will utilizie memory compensatory strategies to recall new, daily information with min A verbal cues. SLP Short Term Goal 2 (Week 1): Pt will perform throat clear and swallow to reduce wet vocal quality with min A verbal cues. SLP Short Term Goal 3 (Week 1): Pt will consume ice chip trials to preserve swallow function with moderate s/s aspiration. SLP Short Term Goal 4 (Week 1): Patient will perform pharyngeal strengthening exercises with Min verbal and visual cues.  Skilled Therapeutic Interventions: Skilled treatment session focused on dysphagia goals. SLP facilitated session by providing Mod A verbal and tactile cues for initiating/generating a strong throat clear with use of his diaphragmatic breathing muscles to clear mucous. Patient with increased strength but continues to demonstrate inability to clear secretions orally. Patient also utilized effortful swallows with lemon glycerin swabs with intermittent coughing noted. Patient left upright in wheelchair with all needs within reach. Continue with current plan of care.      Pain No/Denies Pain   Therapy/Group: Individual Therapy  Savon Cobbs 08/02/2019, 3:20 PM

## 2019-08-02 NOTE — Progress Notes (Signed)
Occupational Therapy Weekly Progress Note  Patient Details  Name: Michael Bright MRN: [id number] Date of Birth: 1956/03/31  Beginning of progress report period: July 27, 2019 End of progress report period: August 02, 2019  Today's Date: 08/02/2019 OT Individual Time: 730-830 OT Individual Time Calculation (min): 60 mins    Patient has met 4 of 4 short term goals. Pt continues to work towards OT goals with improvement in activity tolerance, UB and LB dressing, toileting and functional transfers. Pt very agreeable to working with OT and tolerates treatments well. Continues to need improvement on endurance, functional mobility, dynamic standing balance during functional activities and UB and LB dressing and bathing. Pt reporting he does feel like he is improving but sometimes his SOB and secretions interfere with his therapy.   Patient continues to demonstrate the following deficits: muscle weakness, central origin and decreased sitting balance, decreased standing balance, decreased postural control and decreased balance strategies and therefore will continue to benefit from skilled OT intervention to enhance overall performance with BADL and iADL.  Patient progressing toward long term goals..  Continue plan of care.  OT Short Term Goals Week 1:  OT Short Term Goal 1 (Week 1): Pt will complete 1 grooming task in standing OT Short Term Goal 1 - Progress (Week 1): Met OT Short Term Goal 2 (Week 1): Pt will perform sit to stands for clothing mangement with min A OT Short Term Goal 2 - Progress (Week 1): Met OT Short Term Goal 3 (Week 1): Pt will complete toileting tasks with min A OT Short Term Goal 3 - Progress (Week 1): Met OT Short Term Goal 4 (Week 1): Pt will transfer to the toilet/ BSC with min A consistently OT Short Term Goal 4 - Progress (Week 1): Met Week 2:  OT Short Term Goal 2 (Week 2): STG = LTG d/t ELOS  Skilled Therapeutic Interventions/Progress Updates:  Pt received in  bed supine, no c/o pain and agreeable to tx. Pt comes EOB with (S) and completes LB bathing sitting EOB with (S); completes LB dressing with min A for standing to pull up pants. Pt completed UB dressing and bathing at sink level seated in w/c with (S). Pt is max A for donning TEDS and  Pt transported to therapy gym; completed functional activity to address sit <> stand endurance. Pt required min A for sit  <> stand from w/c and CGA in static standing. End of session pt left in w/c, chair alarm set and all needs met.      Therapy Documentation Precautions:  Precautions Precautions: Fall Precaution Comments: PEG; NPO Required Braces or Orthoses: Other Brace Other Brace: L AFO Restrictions Weight Bearing Restrictions: No         Therapy/Group: Individual Therapy  Monroe Qin 08/02/2019, 1:59 PM

## 2019-08-02 NOTE — Progress Notes (Signed)
[facility] PHYSICAL MEDICINE & REHABILITATION PROGRESS NOTE  Subjective/Complaints: Overall doing well except for left shoulder which is still tender  ROS: Patient denies fever, rash, sore throat, blurred vision, nausea, vomiting, diarrhea, cough, shortness of breath or chest pain,   back pain, headache, or mood change.     Objective: Vital Signs: Blood pressure 127/75, pulse 93, temperature 98 F (36.7 C), temperature source Oral, resp. rate 16, height 6' (1.829 m), weight (!) 136.2 kg, SpO2 98 %. No results found. Recent Labs    07/31/19 0646  WBC 7.2  HGB 12.0*  HCT 36.7*  PLT 304   Recent Labs    07/31/19 0646  NA 135  K 4.2  CL 101  CO2 25  GLUCOSE 186*  BUN 18  CREATININE 0.72  CALCIUM 8.7*    Physical Exam: BP 127/75 (BP Location: Right Arm)   Pulse 93   Temp 98 F (36.7 C) (Oral)   Resp 16   Ht 6' (1.829 m)   Wt (!) 136.2 kg   SpO2 98%   BMI 40.73 kg/m  Constitutional: No distress . Vital signs reviewed. HEENT: EOMI, oral membranes moist Neck: supple Cardiovascular: RRR without murmur. No JVD    Respiratory: CTA Bilaterally without wheezes or rales. Normal effort  , wearing CPAP  GI: BS +, non-tender, non-distended  Skin: See above. Psych: Normal mood.  Normal behavior. Musc: Lower extremity edema.   Left shoulder tender with AROM, +impingement signs, subacromial/deltoid tenderness today  Neuro: Alert Motor: Grossly 4+/5 throughout, except for left shoulder d/t pain. Dysphonic still  Assessment/Plan: 1. Functional deficits secondary to debility which require 3+ hours per day of interdisciplinary therapy in a comprehensive inpatient rehab setting.  Physiatrist is providing close team supervision and 24 hour management of active medical problems listed below.  Physiatrist and rehab team continue to assess barriers to discharge/monitor patient progress toward functional and medical goals  Care Tool:  Bathing    Body parts bathed by patient:  Right arm, Left arm, Abdomen, Chest, Right upper leg, Left upper leg, Face   Body parts bathed by helper: Front perineal area, Buttocks, Right lower leg, Left lower leg     Bathing assist Assist Level: Moderate Assistance - Patient 50 - 74%     Upper Body Dressing/Undressing Upper body dressing   What is the patient wearing?: Pull over shirt    Upper body assist Assist Level: Minimal Assistance - Patient > 75%    Lower Body Dressing/Undressing Lower body dressing      What is the patient wearing?: Pants     Lower body assist Assist for lower body dressing: Maximal Assistance - Patient 25 - 49%     Toileting Toileting    Toileting assist Assist for toileting: Maximal Assistance - Patient 25 - 49%     Transfers Chair/bed transfer  Transfers assist  Chair/bed transfer activity did not occur: Safety/medical concerns  Chair/bed transfer assist level: Contact Guard/Touching assist     Locomotion Ambulation   Ambulation assist      Assist level: Minimal Assistance - Patient > 75% Assistive device: Walker-rolling Max distance: 100 ft   Walk 10 feet activity   Assist     Assist level: Contact Guard/Touching assist Assistive device: Walker-rolling   Walk 50 feet activity   Assist Walk 50 feet with 2 turns activity did not occur: Safety/medical concerns  Assist level: Minimal Assistance - Patient > 75% Assistive device: Walker-rolling    Walk 150 feet activity  Assist Walk 150 feet activity did not occur: Safety/medical concerns  Assist level: Minimal Assistance - Patient > 75% Assistive device: Walker-rolling    Walk 10 feet on uneven surface  activity   Assist Walk 10 feet on uneven surfaces activity did not occur: Safety/medical concerns         Wheelchair     Assist Will patient use wheelchair at discharge?: Yes Type of Wheelchair: Manual    Wheelchair assist level: Supervision/Verbal cueing Max wheelchair distance: 100 ft     Wheelchair 50 feet with 2 turns activity    Assist        Assist Level: Supervision/Verbal cueing   Wheelchair 150 feet activity     Assist Wheelchair 150 feet activity did not occur: Safety/medical concerns         Medical Problem List and Plan: 1. Debility secondary to septic shock from UTI and Klebsiella pneumonia.bacteremia with rhinovirus.  Continue CIR tolerating PT, OT   Cont SLP for swallow     2. Antithrombotics: -DVT/anticoagulation:Pharmaceutical:Heparin -antiplatelet therapy: ASA/Plavix. 3. Pain Management:tylenol prn.  Continue Lidoderm patch for left shoulder  Trial Robaxin 500 3 times daily as needed on 10/9  -added kpad also  -pt demonstrating RTC/bursa signs on exam today. Given ongoing pain will perform subacromial injection tomorrow morning. 4. Mood:LCSW to follow for evaluation and support. -antipsychotic agents: N/A 5. Neuropsych: This patientiscapable of making decisions on hisown behalf. 6. Skin/Wound Care:Routine pressure relief measures. 7. Fluids/Electrolytes/Nutrition:N.p.o.  Tube feeds qid with water flushes.  8. Klebsiella PNA bacteremia/UTI: On Cefdinir IV until 08/02/2019. 9. Bilateral air space disease/Rhino virus: On Pulmicort bid,guifenesin qid (Mucinexcannot be crushed), duo nebs qid  10. Medullary stroke with severe dysphagia with oral secretions  -Robinul   increased to 2 mg 3 times daily on 10/9.  -Continue NPO with tube feedson osmolyte     bolus tube feeds.   Added water flushes  -encouraged sitting up to cough, OOB, pt aware that dysphagia will be an ongoing issue 11. T2DM with hyperglycemia:Monitor BSevery 4 hours with SSI and May need to resume lantus  Lantus 5 units daily started on 10/9--increased to 10u effective today. Will likely need further titration.  Increase to 15u tomorrow morning CBG (last 3)  Recent Labs    08/01/19 1659 08/01/19 2104 08/02/19 0635   GLUCAP 187* 228* 160*     12 HTN: Monitor BP tid--currently controlled off medications.  Controlled on 10/14  Monitor with increased mobility Vitals:   08/01/19 2101 08/02/19 0354  BP:  127/75  Pulse:  93  Resp:  16  Temp:  98 F (36.7 C)  SpO2: 98% 98%   13. OSA: Continue CPAP whenever asleep. He's compliant 14. Anemia of chronic illness:   Hemoglobin 11.6 on 10/8, 12.0 10/12  Continue to monitor  15.  Leukocytosis:  WBCs down to 7.2 10/12  Afebrile  Continue to monitor  16. Urinary retention:   Off urecholine at this time.   Continue proscar.   Cannot use flomax because cannot be crushed.  PVRs low, oob to void 17.  Morbidly obese  Encouraged weight loss  LOS: 7 days A FACE TO FACE EVALUATION WAS PERFORMED  Ranelle Oyster 08/02/2019, 8:38 AM

## 2019-08-02 NOTE — Progress Notes (Signed)
Occupational Therapy Session Note  Patient Details  Name: Michael Bright MRN: [id number] Date of Birth: 05/09/1956  Today's Date: 08/02/2019 OT Individual Time: 1345-1415 OT Individual Time Calculation (min): 30 min    Short Term Goals: Week 1:  OT Short Term Goal 1 (Week 1): Pt will complete 1 grooming task in standing OT Short Term Goal 1 - Progress (Week 1): Met OT Short Term Goal 2 (Week 1): Pt will perform sit to stands for clothing mangement with min A OT Short Term Goal 2 - Progress (Week 1): Met OT Short Term Goal 3 (Week 1): Pt will complete toileting tasks with min A OT Short Term Goal 3 - Progress (Week 1): Met OT Short Term Goal 4 (Week 1): Pt will transfer to the toilet/ BSC with min A consistently OT Short Term Goal 4 - Progress (Week 1): Met  Skilled Therapeutic Interventions/Progress Updates:    1;1. Pt received in w/c requesting to walk to and from tx spaces with RW and CGA and VC for relaxing shoulders. No pain reported. Pt completes 7 sit to stand to high low table with CGA-MIN A for power up and VC for trunk flexion and fasten nut and bolt onto vertical board for static standig balance and no UE support. Pt with L lean requiring MOD VC for correction. Exited sessiion with pt seated in w/c, call light in reach and exit alarm on  Therapy Documentation Precautions:  Precautions Precautions: Fall Precaution Comments: PEG; NPO Required Braces or Orthoses: Other Brace Other Brace: L AFO Restrictions Weight Bearing Restrictions: No General:   Vital Signs:   Pain:   ADL: ADL Eating: NPO Grooming: Moderate assistance Where Assessed-Grooming: Wheelchair Upper Body Bathing: Minimal assistance Where Assessed-Upper Body Bathing: Shower Lower Body Bathing: Maximal assistance Where Assessed-Lower Body Bathing: Shower Upper Body Dressing: Minimal assistance Where Assessed-Upper Body Dressing: Sitting at sink Lower Body Dressing: Moderate assistance Where  Assessed-Lower Body Dressing: Sitting at sink Toilet Transfer: Not assessed Social research officer, government: (used the [employer]) Vision   Perception    Praxis   Exercises:   Other Treatments:     Therapy/Group: Individual Therapy  Tonny [place] 08/02/2019, 2:15 PM

## 2019-08-02 NOTE — Progress Notes (Signed)
Respiratory therapist was called to request nasotracheal suction as suggested by speech therapist.Respiratory therapist said he will follow up.Keep assessing pt.'s needs.

## 2019-08-03 ENCOUNTER — Inpatient Hospital Stay (HOSPITAL_COMMUNITY): Payer: Medicare Other

## 2019-08-03 ENCOUNTER — Inpatient Hospital Stay (HOSPITAL_COMMUNITY): Payer: Medicare Other | Admitting: Speech Pathology

## 2019-08-03 DIAGNOSIS — M75102 Unspecified rotator cuff tear or rupture of left shoulder, not specified as traumatic: Secondary | ICD-10-CM

## 2019-08-03 LAB — GLUCOSE, CAPILLARY
Glucose-Capillary: 164 mg/dL — ABNORMAL HIGH (ref 70–99)
Glucose-Capillary: 156 mg/dL — ABNORMAL HIGH (ref 70–99)
Glucose-Capillary: 221 mg/dL — ABNORMAL HIGH (ref 70–99)
Glucose-Capillary: 271 mg/dL — ABNORMAL HIGH (ref 70–99)

## 2019-08-03 MED ORDER — TRIAMCINOLONE ACETONIDE 40 MG/ML IJ SUSP
40.0000 mg | Freq: Once | INTRAMUSCULAR | Status: AC
  Administered 2019-08-03: 40 mg via INTRA_ARTICULAR
  Filled 2019-08-03: qty 1

## 2019-08-03 MED ORDER — LIDOCAINE HCL (PF) 1 % IJ SOLN
5.0000 mL | Freq: Once | INTRAMUSCULAR | Status: AC
  Administered 2019-08-03: 5 mL
  Filled 2019-08-03 (×2): qty 5

## 2019-08-03 NOTE — Progress Notes (Signed)
Occupational Therapy Session Note  Patient Details  Name: Michael Bright MRN: [id number] Date of Birth: 10/11/1956  Today's Date: 08/03/2019 OT Individual Time: 1130-1200 OT Individual Time Calculation (min): 30 min    Short Term Goals: Week 2:  OT Short Term Goal 2 (Week 2): STG = LTG d/t ELOS      Skilled Therapeutic Interventions/Progress Updates:    Pt received in w/c, no initial c/o pain and reports he is waiting on MD to administer cortisone shot in L shoulder. Pt requested to complete tx in room this date. Tx focused on dynamic standing balance, B UE task, reaching across midline and sit <> stand. Pt completes all sit <> stand from w/c with min A and able to maintain standing balance while completing activity in approx 3 min intervals. Pt completes functional reaching task in standing with objects placed on L side and pt asked to transfer small objects to right side using R hand and UE. Pt completes task with min A for balance and min VC for postural and anterior pelvic tilt. Pt completes functional reaching task in standing, handing objects to targeted areas. Pt completes with min A and min VC backwards lean and L lean. Pt completes ThereEx with 4lb weights addressing endurance and strengthening in standing. Exercises focused on elbow flexion/extension and  shoulder flexion/extention.  Pt completes 2 trials with 12 reps for each UE; completes exercises with one UE supported on RW for support. Pt completes punching exercise with air punches in L UE d/t shoulder pain and 4 lb weight in R UE. Pt completes exercises with B UE and min A for balance. Exited session pt in w/c, chair alarm set and all needs met.   Therapy Documentation Precautions:  Precautions Precautions: Fall Precaution Comments: PEG; NPO Required Braces or Orthoses: Other Brace Other Brace: L AFO Restrictions Weight Bearing Restrictions: No      Therapy/Group: Individual Therapy    08/03/2019, 12:23 PM 

## 2019-08-03 NOTE — Progress Notes (Signed)
Social Work Patient ID: Michael Bright, male   DOB: Sep 10, 1956, 63 y.o.   MRN: [id number]  Have reviewed team conference with pt and wife who are both aware and agreeable with targeted d/c date of 10/20 and CGA goals.  No concerns.  Pleased with progress.  Vidhi Delellis, LCSW

## 2019-08-03 NOTE — Progress Notes (Signed)
Physical Therapy Session Note  Patient Details  Name: Michael Bright MRN: [id number] Date of Birth: [birth year]  Today's Date: 08/03/2019 PT Individual Time: 1000-1030 PT Individual Time Calculation (min): 30 min   Short Term Goals: Week 1:  PT Short Term Goal 1 (Week 1): Pt will be able to perform functional bed mobility with min assist PT Short Term Goal 1 - Progress (Week 1): Met PT Short Term Goal 2 (Week 1): Pt will be able to perform basic transfers with min assist PT Short Term Goal 2 - Progress (Week 1): Met PT Short Term Goal 3 (Week 1): Pt will be able to gait x 50' with min assist PT Short Term Goal 3 - Progress (Week 1): Met PT Short Term Goal 4 (Week 1): Pt will be able to initiate stair training PT Short Term Goal 4 - Progress (Week 1): Met  Skilled Therapeutic Interventions/Progress Updates:   Pt seated in w/c.  He rated pain 3/10 L shoulder.  He stated that Dr. Naaman Plummer plans to inject it.  neuromuscular re-education via forced use, mutlimodal cues, positioning of feet, for aligning hips over knees in sitting : 2 x 10 bil hip adductor squeezes.  From w/c using Kinetron at 30 cm/sec x 25 cycles x 2 targeting quadriceps, x 25 cycles x 2 targeting gluteals, in hip flexion.    Pt is eager to stretch out his hip abductors.  PT placed gait belt around distal thighs and demonstrated how to release buckle.  Pt able to release buckle with R hand.  Pt instructed to keep belt around thighs for 15 min or to tolerance.  At end of session, pt seated in w/c with needs at hand and set pad alarm set. Therapy Documentation Precautions:  Precautions Precautions: Fall Precaution Comments: PEG; NPO Required Braces or Orthoses: Other Brace Other Brace: L AFO Restrictions Weight Bearing Restrictions: No      Therapy/Group: Individual Therapy  Camile Esters 08/03/2019, 10:41 AM

## 2019-08-03 NOTE — Progress Notes (Signed)
Physical Therapy Session Note  Patient Details  Name: Michael Bright MRN: [id number] Date of Birth: 1956-09-07  Today's Date: 08/03/2019 PT Individual Time: 0800-0900 PT Individual Time Calculation (min): 60 min   Short Term Goals: Week 2:  PT Short Term Goal 1 (Week 2): STG = LTG due to estimated d/c date.  Skilled Therapeutic Interventions/Progress Updates:     Patient in bed with RN in room providing morning meds upon PT arrival. Patient alert and agreeable to PT session. Patient reported 9/10 L shoulder pain during session, RN made aware and patient declined pain medicine. PT provided repositioning, rest breaks, and distraction as pain interventions throughout session.   Therapeutic Activity: Bed Mobility: Patient performed supine to/from sit on a flat bed without use of bed rails and on a mat table with supervision. Provided verbal cues for perform side-lying to supine to reduce having to shift in the bed after lying down. Sitting EOB patient threaded LEs through pants and PT donned B socks and tennis shoes with L AFO with total A for time management. Transfers: Patient performed sit to/from stand x1 to pull up pants with CGA for balance and supervision for pulling up pants and stand pivot transfers x2 with min A-CGA with bed elevated slightly. Provided verbal cues for hand placement on RW, leaning forward to stand, and manual facilitation for foot placement prior to standing. Noted L LE externally rotates and foot supinates during stands.  Wheelchair Mobility:  Patient was transported in the w/c with total A throughout session for energy conservation and management of L shoulder pain.   Neuromuscular Re-ed: Patient performed sit to/from stand training focused on foot placement, L LE control, and forward weight shifts. Performed pre-stand 2x5 with manual facilitation and multi-modal cues for forward weight shift to lift hips off the mat table. He then performed sit to/from stand x10 with  the same technique and PT blocking L knee to prevent external rotation with min A with mat table slightly elevated progressing to CGA with the mat table at lowest setting.   Manual Therapy: Patient demonstrated restricted and painful L shoulder flexion <90 degrees in sitting. PT performed grade 1-2 A/P mobilizations 2x45 sec for pain control to his L shoulder in supine. Then performed grade 3-4 inferior with progressive abduction and distraction mobilizations, 2x45 sec each, for increased abduction. Patient demonstrated >90 degree shoulder flexion in sitting with 3/10 L shoulder pain after. Patient was very appreciative following intervention.  Patient in w/c at the sink to perform hygiene activities at end of session with breaks locked, chair alarm set, and all needs within reach.    Therapy Documentation Precautions:  Precautions Precautions: Fall Precaution Comments: PEG; NPO Required Braces or Orthoses: Other Brace Other Brace: L AFO Restrictions Weight Bearing Restrictions: No    Therapy/Group: Individual Therapy  Jana Swartzlander L Candice Tobey PT, DPT  08/03/2019, 3:47 PM

## 2019-08-03 NOTE — Progress Notes (Signed)
[place] PHYSICAL MEDICINE & REHABILITATION PROGRESS NOTE  Subjective/Complaints: Secretions/cough ongoing issues at times. Left shoulder sore  ROS: Patient denies fever, rash, sore throat, blurred vision, nausea, vomiting, diarrhea,  shortness of breath or chest pain,  headache, or mood change.   Objective: Vital Signs: Blood pressure (!) 142/83, pulse 95, temperature 98 F (36.7 C), temperature source Oral, resp. rate 16, height 6' (1.829 m), weight (!) 139 kg, SpO2 100 %. No results found. No results for input(s): WBC, HGB, HCT, PLT in the last 72 hours. No results for input(s): NA, K, CL, CO2, GLUCOSE, BUN, CREATININE, CALCIUM in the last 72 hours.  Physical Exam: BP (!) 142/83 (BP Location: Left Arm)   Pulse 95   Temp 98 F (36.7 C) (Oral)   Resp 16   Ht 6' (1.829 m)   Wt (!) 139 kg   SpO2 100%   BMI 41.56 kg/m  Constitutional: No distress . Vital signs reviewed. HEENT: EOMI, oral membranes moist Neck: supple Cardiovascular: RRR without murmur. No JVD    Respiratory: CTA Bilaterally without wheezes or rales. Normal effort   occ rhonchi GI: BS +, non-tender, non-distended  Skin: intact. Psych: Normal mood.  Normal behavior. Musc: Lower extremity edema.   Left shoulder tender with AROM, +impingement signs still  subacromial/deltoid tenderness today  Neuro: Alert Motor: Grossly 4+/5 throughout, except for left shoulder d/t pain. Dysphonic still  Assessment/Plan: 1. Functional deficits secondary to debility which require 3+ hours per day of interdisciplinary therapy in a comprehensive inpatient rehab setting.  Physiatrist is providing close team supervision and 24 hour management of active medical problems listed below.  Physiatrist and rehab team continue to assess barriers to discharge/monitor patient progress toward functional and medical goals  Care Tool:  Bathing    Body parts bathed by patient: Right arm, Left arm, Abdomen, Chest, Right upper leg, Left  upper leg, Face   Body parts bathed by helper: Front perineal area, Buttocks, Right lower leg, Left lower leg     Bathing assist Assist Level: Moderate Assistance - Patient 50 - 74%     Upper Body Dressing/Undressing Upper body dressing   What is the patient wearing?: Pull over shirt    Upper body assist Assist Level: Minimal Assistance - Patient > 75%    Lower Body Dressing/Undressing Lower body dressing      What is the patient wearing?: Pants     Lower body assist Assist for lower body dressing: Maximal Assistance - Patient 25 - 49%     Toileting Toileting    Toileting assist Assist for toileting: Maximal Assistance - Patient 25 - 49%     Transfers Chair/bed transfer  Transfers assist  Chair/bed transfer activity did not occur: Safety/medical concerns  Chair/bed transfer assist level: Contact Guard/Touching assist     Locomotion Ambulation   Ambulation assist      Assist level: Minimal Assistance - Patient > 75% Assistive device: Walker-rolling Max distance: 125 ft   Walk 10 feet activity   Assist     Assist level: Minimal Assistance - Patient > 75% Assistive device: Walker-rolling   Walk 50 feet activity   Assist Walk 50 feet with 2 turns activity did not occur: Safety/medical concerns  Assist level: Minimal Assistance - Patient > 75% Assistive device: Walker-rolling    Walk 150 feet activity   Assist Walk 150 feet activity did not occur: Safety/medical concerns  Assist level: Minimal Assistance - Patient > 75% Assistive device: Walker-rolling    Walk  10 feet on uneven surface  activity   Assist Walk 10 feet on uneven surfaces activity did not occur: Safety/medical concerns         Wheelchair     Assist Will patient use wheelchair at discharge?: Yes Type of Wheelchair: Manual    Wheelchair assist level: Supervision/Verbal cueing Max wheelchair distance: 100 ft    Wheelchair 50 feet with 2 turns  activity    Assist        Assist Level: Supervision/Verbal cueing   Wheelchair 150 feet activity     Assist Wheelchair 150 feet activity did not occur: Safety/medical concerns         Medical Problem List and Plan: 1. Debility secondary to septic shock from UTI and Klebsiella pneumonia.bacteremia with rhinovirus.  Continue CIR tolerating PT, OT   Cont SLP for dysphagia     2. Antithrombotics: -DVT/anticoagulation:Pharmaceutical:Heparin -antiplatelet therapy: ASA/Plavix. 3. Pain Management:tylenol prn.  Continue Lidoderm patch for left shoulder    Robaxin 500 3 times daily as needed    -added kpad also  -After informed consent and preparation of the skin with betadine and isopropyl alcohol, I injected 40mg  kenalog and 4cc of 1% lidocaine into the left subacromial space via lateral approach. Additionally, aspiration was performed prior to injection. The patient tolerated well, and no complications were encountered. Afterward the area was cleaned and dressed. Post- injection instructions were provided.   4. Mood:LCSW to follow for evaluation and support. -antipsychotic agents: N/A 5. Neuropsych: This patientiscapable of making decisions on hisown behalf. 6. Skin/Wound Care:Routine pressure relief measures. 7. Fluids/Electrolytes/Nutrition:N.p.o.  Tube feeds qid with water flushes.  8. Klebsiella PNA bacteremia/UTI: On Cefdinir IV until 08/02/2019. 9. Bilateral air space disease/Rhino virus: On Pulmicort bid,guifenesin qid (Mucinexcannot be crushed), duo nebs qid  10. 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  -Continue NPO with tube feedson osmolyte     bolus tube feeds.   Added water flushes  -encouraged sitting up to cough, OOB, pt aware that dysphagia will be an ongoing issue. RRT following closely 11. T2DM with hyperglycemia:Monitor BSevery 4 hours  with SSI and May need to resume lantus  Lantus 5 units daily started on 10/9--increased to 10u effective today. Will likely need further titration.  Increased to 15u today CBG (last 3)  Recent Labs    08/02/19 1659 08/02/19 2100 08/03/19 0604  GLUCAP 159* 231* 164*    -sugars will pop after Steroid injection today---SSI  12 HTN: Monitor BP tid--currently controlled off medications.  Controlled on 10/15  Monitor with increased mobility Vitals:   08/02/19 1954 08/03/19 0532  BP: (!) 148/83 (!) 142/83  Pulse: 100 95  Resp: 15 16  Temp: 98 F (36.7 C) 98 F (36.7 C)  SpO2: 100% 100%   13. OSA: Continue CPAP whenever asleep. He's compliant 14. Anemia of chronic illness:   Hemoglobin 11.6 on 10/8, 12.0 10/12  Continue to monitor  15.  Leukocytosis:  WBCs down to 7.2 10/12  Afebrile  Continue to monitor  16. Urinary retention:   Off urecholine at this time.   Continue proscar.   Cannot use flomax because cannot be crushed.  PVRs low, continue oob to void 17.  Morbidly obese  Encouraged weight loss  LOS: 8 days A FACE TO FACE EVALUATION WAS PERFORMED  Meredith Staggers 08/03/2019, 9:42 AM

## 2019-08-03 NOTE — Progress Notes (Signed)
Speech Language Pathology Weekly Progress and Session Note  Patient Details  Name: Michael Bright MRN: [id number] Date of Birth: Jun 12, 1956  Beginning of progress report period: July 27, 2019 End of progress report period: August 03, 2019  Today's Date: 08/03/2019 SLP Individual Time: 2903-7955 SLP Individual Time Calculation (min): 30 min  Short Term Goals: Week 1: SLP Short Term Goal 1 (Week 1): Pt will utilizie memory compensatory strategies to recall new, daily information with min A verbal cues. SLP Short Term Goal 1 - Progress (Week 1): Met SLP Short Term Goal 2 (Week 1): Pt will perform throat clear and swallow to reduce wet vocal quality with min A verbal cues. SLP Short Term Goal 2 - Progress (Week 1): Met SLP Short Term Goal 3 (Week 1): Pt will consume ice chip trials to preserve swallow function with moderate s/s aspiration. SLP Short Term Goal 3 - Progress (Week 1): Met SLP Short Term Goal 4 (Week 1): Patient will perform pharyngeal strengthening exercises with Min verbal and visual cues. SLP Short Term Goal 4 - Progress (Week 1): Met    New Short Term Goals: Week 2: SLP Short Term Goal 1 (Week 2): STGs=LTGs due to ELOS  Weekly Progress Updates: Patient has made functional gains and has met 4 of 4 STGs this reporting period. Currently, patient remains NPO and continues to demonstrate difficulty clearing secretions, however, with the use of RMT, patient's overall cough strength appears to be improving. Patient is performing RMT and pharyngeal strengthening exercises with overall Mod I but intermittent verbal cues needed for accuracy at times. Patient is also consuming trials of ice chips with continued overt s/s of aspiration noted. Suspect patient will not be ready for repeat MBS prior to discharge and will discharge NPO. Patient demonstrated improved recall of daily information with overall supervision-Mod I. Patient education ongoing. Patient would benefit from continued  skilled SLP intervention to maximize safety due to swallowing deficits and accuracy of swallowing exercises prior to discharge.      Intensity: Minumum of 1-2 x/day, 30 to 90 minutes Frequency: 3 to 5 out of 7 days Duration/Length of Stay: 08/08/19 Treatment/Interventions: Cognitive remediation/compensation;Cueing hierarchy;Dysphagia/aspiration precaution training;Functional tasks;Internal/external aids;Patient/family education;Therapeutic Activities;Therapeutic Exercise   Daily Session  Skilled Therapeutic Interventions: Skilled treatment session focused on dysphagia goals. Upon arrival, patient's vocal quality sounded less wet and more clear this session. Patient reported the RT educated him on how to produce a more efficient cough but patient required Min verbal cues for correct hand placement. Patient performed oral care via the suction toothbrush with set-up and consumed trials of ice chips with overt s/s of aspiration in 75% of trials. Recommend patient remain NPO. Patient left upright in wheelchair with all needs within reach. Continue with current plan of care.     Pain No/Denies Pain   Therapy/Group: Individual Therapy  Michael Bright 08/03/2019, 6:39 AM

## 2019-08-03 NOTE — Progress Notes (Signed)
Occupational Therapy Session Note  Patient Details  Name: Michael Bright MRN: [id number] Date of Birth: 1956-08-22  Today's Date: 08/03/2019 OT Individual Time: 1300-1345 OT Individual Time Calculation (min): 45 min    Short Term Goals: Week 1:  OT Short Term Goal 1 (Week 1): Pt will complete 1 grooming task in standing OT Short Term Goal 1 - Progress (Week 1): Met OT Short Term Goal 2 (Week 1): Pt will perform sit to stands for clothing mangement with min A OT Short Term Goal 2 - Progress (Week 1): Met OT Short Term Goal 3 (Week 1): Pt will complete toileting tasks with min A OT Short Term Goal 3 - Progress (Week 1): Met OT Short Term Goal 4 (Week 1): Pt will transfer to the toilet/ BSC with min A consistently OT Short Term Goal 4 - Progress (Week 1): Met  Skilled Therapeutic Interventions/Progress Updates:    OT intervention with focus on standing balance and BLE strengthening.  Pt engaged in standing tasks including assembling pipe structure, retrieving horseshoes from elevated surface and tossing at take, retrieving horseshoes with LUE and transferring to RUE and tossing X 2.  Pt also comleted task while standing on Airex cushion.  While standing on Airex cushion pt performed chest presses with beach ball and trunk rotation while holding beach ball.  Pt required CGA for all standing avtivities. Pt returned to room and remained in w/c with all needs within reach and seat alarm activated.   Therapy Documentation Precautions:  Precautions Precautions: Fall Precaution Comments: PEG; NPO Required Braces or Orthoses: Other Brace Other Brace: L AFO Restrictions Weight Bearing Restrictions: No  Pain:  Pt states his L shoulder is feeling better   Therapy/Group: Individual Therapy  Leroy Libman 08/03/2019, 2:32 PM

## 2019-08-04 ENCOUNTER — Inpatient Hospital Stay (HOSPITAL_COMMUNITY): Payer: Medicare Other | Admitting: Speech Pathology

## 2019-08-04 ENCOUNTER — Inpatient Hospital Stay (HOSPITAL_COMMUNITY): Payer: Medicare Other

## 2019-08-04 ENCOUNTER — Inpatient Hospital Stay (HOSPITAL_COMMUNITY): Payer: Medicare Other | Admitting: Occupational Therapy

## 2019-08-04 LAB — GLUCOSE, CAPILLARY
Glucose-Capillary: 190 mg/dL — ABNORMAL HIGH (ref 70–99)
Glucose-Capillary: 172 mg/dL — ABNORMAL HIGH (ref 70–99)
Glucose-Capillary: 206 mg/dL — ABNORMAL HIGH (ref 70–99)
Glucose-Capillary: 275 mg/dL — ABNORMAL HIGH (ref 70–99)

## 2019-08-04 NOTE — Progress Notes (Signed)
Occupational Therapy Session Note  Patient Details  Name: Michael Bright MRN: [id number] Date of Birth: 11-13-1955  Today's Date: 08/04/2019 OT Individual Time: 1030-1100 OT Individual Time Calculation (min): 30 min    Short Term Goals: Week 2:  OT Short Term Goal 2 (Week 2): STG = LTG d/t ELOS  Skilled Therapeutic Interventions/Progress Updates:    Treatment session with focus on sit <> stand, standing endurance, and LUE NMR. Pt received upright in w/c utilizing HEP with coins that this therapist had previously provided.  Pt demonstrating increased shoulder flexion and reach outside BOS this session.  Utilized small animal figurines in standing with focus on standing balance and endurance while reaching to challenge gross and fine motor control.  Pt demonstrating improved motor control and ability to reach across midline to place items in cup. Pt with occasional undershooting, educated on functional carryover of task.  CGA provided for all standing.  Pt remained upright in w/c with all needs in reach and chair alarm on.  Therapy Documentation Precautions:  Precautions Precautions: Fall Precaution Comments: PEG; NPO Required Braces or Orthoses: Other Brace Other Brace: L AFO Restrictions Weight Bearing Restrictions: No General:   Vital Signs: Oxygen Therapy O2 Device: Room Air Pain: Pain Assessment Pain Scale: 0-10 Pain Score: 0-No pain   Therapy/Group: Individual Therapy  Simonne Come 08/04/2019, 12:30 PM

## 2019-08-04 NOTE — Progress Notes (Signed)
Physical Therapy Session Note  Patient Details  Name: Michael Bright MRN: [id number] Date of Birth: 1956/10/14  Today's Date: 08/04/2019 PT Individual Time: 1430-1530 PT Individual Time Calculation (min): 60 min   Short Term Goals: Week 1:  PT Short Term Goal 1 (Week 1): Pt will be able to perform functional bed mobility with min assist PT Short Term Goal 1 - Progress (Week 1): Met PT Short Term Goal 2 (Week 1): Pt will be able to perform basic transfers with min assist PT Short Term Goal 2 - Progress (Week 1): Met PT Short Term Goal 3 (Week 1): Pt will be able to gait x 50' with min assist PT Short Term Goal 3 - Progress (Week 1): Met PT Short Term Goal 4 (Week 1): Pt will be able to initiate stair training PT Short Term Goal 4 - Progress (Week 1): Met Week 2:  PT Short Term Goal 1 (Week 2): STG = LTG due to estimated d/c date. Week 3:     Skilled Therapeutic Interventions/Progress Updates:    PAIN 3/10 L shoulder, treatment to tolerance Pt initially OOB in wc.  wc propulsion w/bilat UE's x 66f as warm up activity.  STS from wc w/repeated efforts w/assistance from min to cga and verbal cues for positioining forward in chair and ant wt shift.  Following instruction pt performed this remaining of session w/cga only.   Gait 1368fw/RW, cga, LAFO, decreased clearance LLE, able to improve w/VCs.  Stair training: Discussed leading w/stronger leg ascending, weaker leg descending.  Asceded/descended 4stairs 2/2 rails and cga. Repeated second time.  Second effort pt w/wobbles L knee ascending but no buckling.  Step to gait pattern.  Pt leads w/both L and R ascending and descending depending on which leg he feels most confidence in at the time.   Gait 16019f/RW, cga, LAFO and the following deviations - decreased clearance LLE, inconsistent step lengthg, variable cadence. Pt requesting to return to room due to sensation of pending BM. Transported to room for time efficiency due to concers of  BM.  In room LE seated therex including:  LAqs lifting blue bolster 2x25 Hamstring curls w/orange theraband resistence 2x15 each Isometric hip adduction w/blue bolster 2x25 Knee flex/ext rolling blue bolster on floor 1mi65m 2 Marching in chair 2x40 (20each leg x2)  Resisted hip abd w/orange TB 2x20  Pt left OOB in wc, requested to leave chair alarm off so he could strraighten his room and collect his laundry from wc level. Discussed w/nursing and  Nursing agreed.  Pt left oob in wc w/ needs in reach.  Therapy Documentation Precautions:  Precautions Precautions: Fall Precaution Comments: PEG; NPO Required Braces or Orthoses: Other Brace Other Brace: L AFO Restrictions Weight Bearing Restrictions: No    Therapy/Group: Individual Therapy  BarbCallie Fielding  08/04/2019, 3:51 PM

## 2019-08-04 NOTE — Progress Notes (Signed)
Speech Language Pathology Daily Session Note  Patient Details  Name: Michael Bright MRN: [id number] Date of Birth: 1956-06-07  Today's Date: 08/04/2019 SLP Individual Time: 0925-1010 SLP Individual Time Calculation (min): 45 min  Short Term Goals: Week 2: SLP Short Term Goal 1 (Week 2): STGs=LTGs due to ELOS  Skilled Therapeutic Interventions: Skilled treatment session focused on dysphagia goals. SLP facilitated session by advancing his EMST device to 50 cm H2O. Patient performed 25 repetitions with supervision level verbal cues for accuracy. Patient also performed 10 repetitions of pharyngeal strengthening exercises with Mod I. Patient consumed trials of ice chips with Min verbal cues for utilization of an effortful swallow which led to consistent wet vocal quality and delayed coughing. Patient left upright in wheelchair with alarm on and all needs within reach. Continue with current plan of care.       Pain No/Denies Pain   Therapy/Group: Individual Therapy  Selby Slovacek 08/04/2019, 2:50 PM

## 2019-08-04 NOTE — Progress Notes (Signed)
Occupational Therapy Session Note  Patient Details  Name: Michael Bright MRN: [id number] Date of Birth: [birth year]  Today's Date: 08/04/2019 OT Individual Time: 100-200 OT Individual Time Calculation (min): 60 min    Short Term Goals: Week 2:  OT Short Term Goal 2 (Week 2): STG = LTG d/t ELOS      Skilled Therapeutic Interventions/Progress Updates:  Pt received in w/c, no c/o pain and ready for tx. Pt completes sit <> stand with CGA and completes functional mobility with RW to therapy gym and CGA. Pt completed Box and Blocks test to address functional fine motor use of B UE, activity tolerance, and dynamic standing. Pt score for test recorded as R 40 blocks, L 33 blocks. Pt completes functional mobility with RW of approx 30', walking around cones and stepping over small objects to mirror home environment. Pt completes functional mobility back to room with min A and min VC for L foot clearance. Completes UB bathing and dressing with (S) while seated at sink. Pt completes LB dressing and bathing with CGA for balance when standing to wash buttocks. Pt is (S) for donning shoes and max A for donning TEDS. Exited session with pt in w/c, chair alarm set and all needs met.       Therapy Documentation Precautions:  Precautions Precautions: Fall Precaution Comments: PEG; NPO Required Braces or Orthoses: Other Brace Other Brace: L AFO Restrictions Weight Bearing Restrictions: No      Therapy/Group: Individual Therapy  [name] 08/04/2019, 1:20 PM

## 2019-08-04 NOTE — Progress Notes (Signed)
[facility] PHYSICAL MEDICINE & REHABILITATION PROGRESS NOTE  Subjective/Complaints: Had a good night. Slept well. Feels that tips from RT have helped with cough. Left shoulder feels better  ROS: Patient denies fever, rash, sore throat, blurred vision, nausea, vomiting, diarrhea, cough, shortness of breath or chest pain, joint or back pain, headache, or mood change.    Objective: Vital Signs: Blood pressure 133/87, pulse 87, temperature 98.2 F (36.8 C), resp. rate 18, height 6' (1.829 m), weight (!) 138.9 kg, SpO2 96 %. No results found. No results for input(s): WBC, HGB, HCT, PLT in the last 72 hours. No results for input(s): NA, K, CL, CO2, GLUCOSE, BUN, CREATININE, CALCIUM in the last 72 hours.  Physical Exam: BP 133/87 (BP Location: Right Arm)   Pulse 87   Temp 98.2 F (36.8 C)   Resp 18   Ht 6' (1.829 m)   Wt (!) 138.9 kg   SpO2 96%   BMI 41.53 kg/m  Constitutional: No distress . Vital signs reviewed. HEENT: EOMI, oral membranes moist Neck: supple Cardiovascular: RRR without murmur. No JVD    Respiratory: CTA Bilaterally without wheezes or rales. Normal effort    GI: BS +, non-tender, non-distended  Skin: intact. Psych: Normal mood.  pleasant Musc: Lower extremity edema.   Left shoulder with MUCH improved AROM---easily able to lift and nearly can touch ear today. Minimal inpingement signs  Neuro: Alert Motor: Grossly 4+/5 throughout prox to distal.  Dysphonic still  Assessment/Plan: 1. Functional deficits secondary to debility which require 3+ hours per day of interdisciplinary therapy in a comprehensive inpatient rehab setting.  Physiatrist is providing close team supervision and 24 hour management of active medical problems listed below.  Physiatrist and rehab team continue to assess barriers to discharge/monitor patient progress toward functional and medical goals  Care Tool:  Bathing    Body parts bathed by patient: Right arm, Left arm, Abdomen, Chest,  Right upper leg, Left upper leg, Face   Body parts bathed by helper: Front perineal area, Buttocks, Right lower leg, Left lower leg     Bathing assist Assist Level: Moderate Assistance - Patient 50 - 74%     Upper Body Dressing/Undressing Upper body dressing   What is the patient wearing?: Pull over shirt    Upper body assist Assist Level: Minimal Assistance - Patient > 75%    Lower Body Dressing/Undressing Lower body dressing      What is the patient wearing?: Pants     Lower body assist Assist for lower body dressing: Maximal Assistance - Patient 25 - 49%     Toileting Toileting    Toileting assist Assist for toileting: Maximal Assistance - Patient 25 - 49%     Transfers Chair/bed transfer  Transfers assist  Chair/bed transfer activity did not occur: Safety/medical concerns  Chair/bed transfer assist level: Minimal Assistance - Patient > 75% Chair/bed transfer assistive device: Programmer, multimedia   Ambulation assist      Assist level: Minimal Assistance - Patient > 75% Assistive device: Walker-rolling Max distance: 125 ft   Walk 10 feet activity   Assist     Assist level: Minimal Assistance - Patient > 75% Assistive device: Walker-rolling   Walk 50 feet activity   Assist Walk 50 feet with 2 turns activity did not occur: Safety/medical concerns  Assist level: Minimal Assistance - Patient > 75% Assistive device: Walker-rolling    Walk 150 feet activity   Assist Walk 150 feet activity did not occur: Safety/medical  concerns  Assist level: Minimal Assistance - Patient > 75% Assistive device: Walker-rolling    Walk 10 feet on uneven surface  activity   Assist Walk 10 feet on uneven surfaces activity did not occur: Safety/medical concerns         Wheelchair     Assist Will patient use wheelchair at discharge?: Yes Type of Wheelchair: Manual    Wheelchair assist level: Supervision/Verbal cueing Max wheelchair  distance: 100 ft    Wheelchair 50 feet with 2 turns activity    Assist        Assist Level: Supervision/Verbal cueing   Wheelchair 150 feet activity     Assist Wheelchair 150 feet activity did not occur: Safety/medical concerns         Medical Problem List and Plan: 1. Debility secondary to septic shock from UTI and Klebsiella pneumonia.bacteremia with rhinovirus.  Continue CIR tolerating PT, OT   Cont SLP for dysphagia     2. Antithrombotics: -DVT/anticoagulation:Pharmaceutical:Heparin -antiplatelet therapy: ASA/Plavix. 3. Pain Management:tylenol prn.  Continue Lidoderm patch for left shoulder    Robaxin 500 3 times daily as needed    -continue kpad also  -nice results with subacromial shoulder injection 10/15   -continue ROM/exercises with therapy 4. Mood:LCSW to follow for evaluation and support. -antipsychotic agents: N/A 5. Neuropsych: This patientiscapable of making decisions on hisown behalf. 6. Skin/Wound Care:Routine pressure relief measures. 7. Fluids/Electrolytes/Nutrition:N.p.o.  Tube feeds qid with water flushes.  8. Klebsiella PNA bacteremia/UTI: On Cefdinir IV until 08/02/2019. 9. Bilateral air space disease/Rhino virus: On Pulmicort bid,guifenesin qid (Mucinexcannot be crushed), duo nebs qid  10. 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  -Continue NPO with tube feedson osmolyte     bolus tube feeds.   Added water flushes  -continue coughing and secretion clearance stragegies 11. T2DM with hyperglycemia:Monitor BSevery 4 hours with SSI and May need to resume lantus  Lantus increased to 15u 10/15 CBG (last 3)  Recent Labs    08/03/19 1646 08/03/19 2136 08/04/19 0644  GLUCAP 221* 271* 190*    -cbg's likely to increase after steroid injections   -cover with SSI 12 HTN: Monitor BP tid--currently controlled off  medications.  Borderline controlled on 10/16   Vitals:   08/03/19 2038 08/04/19 0501  BP: 129/73 133/87  Pulse: 100 87  Resp: 18 18  Temp: 98.3 F (36.8 C) 98.2 F (36.8 C)  SpO2: 98% 96%   13. OSA: Continue CPAP whenever asleep. He's compliant 14. Anemia of chronic illness:   Hemoglobin 11.6 on 10/8, 12.0 10/12  Continue to monitor  15.  Leukocytosis:  WBCs down to 7.2 10/12  Afebrile  Continue to monitor  16. Urinary retention:   Off urecholine at this time.   Continue proscar.   Cannot use flomax because cannot be crushed.  PVRs low, continue oob to void 17.  Morbidly obese  Have encouraged weight loss  LOS: 9 days A FACE TO FACE EVALUATION WAS PERFORMED  Ranelle Oyster 08/04/2019, 9:38 AM

## 2019-08-05 ENCOUNTER — Inpatient Hospital Stay (HOSPITAL_COMMUNITY): Payer: Medicare Other | Admitting: Speech Pathology

## 2019-08-05 DIAGNOSIS — R0989 Other specified symptoms and signs involving the circulatory and respiratory systems: Secondary | ICD-10-CM

## 2019-08-05 DIAGNOSIS — R7309 Other abnormal glucose: Secondary | ICD-10-CM

## 2019-08-05 LAB — GLUCOSE, CAPILLARY
Glucose-Capillary: 187 mg/dL — ABNORMAL HIGH (ref 70–99)
Glucose-Capillary: 165 mg/dL — ABNORMAL HIGH (ref 70–99)
Glucose-Capillary: 209 mg/dL — ABNORMAL HIGH (ref 70–99)
Glucose-Capillary: 255 mg/dL — ABNORMAL HIGH (ref 70–99)

## 2019-08-05 NOTE — Progress Notes (Addendum)
[facility] PHYSICAL MEDICINE & REHABILITATION PROGRESS NOTE  Subjective/Complaints: Patient seen laying in bed this morning with CPAP in place.  He states he slept well overnight.  Discussed dcing sleep chart and PVRs with nurse tech has both been normal.  ROS: Denies CP, SOB, N/V/D  Objective: Vital Signs: Blood pressure (!) 154/88, pulse 95, temperature 98 F (36.7 C), resp. rate 18, height 6' (1.829 m), weight 135.9 kg, SpO2 98 %. No results found. No results for input(s): WBC, HGB, HCT, PLT in the last 72 hours. No results for input(s): NA, K, CL, CO2, GLUCOSE, BUN, CREATININE, CALCIUM in the last 72 hours.  Physical Exam: BP (!) 154/88 (BP Location: Left Arm)   Pulse 95   Temp 98 F (36.7 C)   Resp 18   Ht 6' (1.829 m)   Wt 135.9 kg   SpO2 98%   BMI 40.63 kg/m  Constitutional: No distress . Vital signs reviewed.  Morbidly obese. HENT: Normocephalic.  Atraumatic. Eyes: EOMI. No discharge. Cardiovascular: No JVD. Respiratory: Normal effort.  No stridor. GI: Non-distended. Skin: Warm and dry.  Intact. Psych: Normal mood.  Normal behavior. Musc: Lower extremity edema Neuro: Alert Motor: Grossly 4+/5 throughout prox to distal, unchanged. Dysphonia  Assessment/Plan: 1. Functional deficits secondary to debility which require 3+ hours per day of interdisciplinary therapy in a comprehensive inpatient rehab setting.  Physiatrist is providing close team supervision and 24 hour management of active medical problems listed below.  Physiatrist and rehab team continue to assess barriers to discharge/monitor patient progress toward functional and medical goals  Care Tool:  Bathing    Body parts bathed by patient: Right arm, Left arm, Chest, Abdomen, Buttocks, Front perineal area, Right upper leg, Left upper leg, Right lower leg, Left lower leg, Face   Body parts bathed by helper: Front perineal area, Buttocks, Right lower leg, Left lower leg     Bathing assist Assist  Level: Contact Guard/Touching assist     Upper Body Dressing/Undressing Upper body dressing   What is the patient wearing?: Pull over shirt    Upper body assist Assist Level: Supervision/Verbal cueing    Lower Body Dressing/Undressing Lower body dressing      What is the patient wearing?: Pants, Underwear/pull up     Lower body assist Assist for lower body dressing: Contact Guard/Touching assist     Toileting Toileting    Toileting assist Assist for toileting: Maximal Assistance - Patient 25 - 49%     Transfers Chair/bed transfer  Transfers assist  Chair/bed transfer activity did not occur: Safety/medical concerns  Chair/bed transfer assist level: Minimal Assistance - Patient > 75% Chair/bed transfer assistive device: Arboriculturist assist      Assist level: Contact Guard/Touching assist Assistive device: Walker-rolling Max distance: 150   Walk 10 feet activity   Assist     Assist level: Contact Guard/Touching assist Assistive device: Walker-rolling   Walk 50 feet activity   Assist Walk 50 feet with 2 turns activity did not occur: Safety/medical concerns  Assist level: Contact Guard/Touching assist Assistive device: Walker-rolling    Walk 150 feet activity   Assist Walk 150 feet activity did not occur: Safety/medical concerns  Assist level: Contact Guard/Touching assist Assistive device: Walker-rolling    Walk 10 feet on uneven surface  activity   Assist Walk 10 feet on uneven surfaces activity did not occur: Safety/medical concerns         Wheelchair     Assist  Will patient use wheelchair at discharge?: Yes Type of Wheelchair: Manual    Wheelchair assist level: Supervision/Verbal cueing Max wheelchair distance: 100 ft    Wheelchair 50 feet with 2 turns activity    Assist        Assist Level: Supervision/Verbal cueing   Wheelchair 150 feet activity     Assist Wheelchair 150  feet activity did not occur: Safety/medical concerns         Medical Problem List and Plan: 1. Debility secondary to septic shock from UTI and Klebsiella pneumonia.bacteremia with rhinovirus.  Continue CIR 2. Antithrombotics: -DVT/anticoagulation:Pharmaceutical:Heparin -antiplatelet therapy: ASA/Plavix. 3. Pain Management:tylenol prn.  Continue Lidoderm patch for left shoulder    Robaxin 500 3 times daily as needed    -continue kpad also  -nice results with subacromial shoulder injection 10/15   -continue ROM/exercises with therapy  Relatively controlled on 10/17 4. Mood:LCSW to follow for evaluation and support. -antipsychotic agents: N/A 5. Neuropsych: This patientiscapable of making decisions on hisown behalf. 6. Skin/Wound Care:Routine pressure relief measures. 7. Fluids/Electrolytes/Nutrition:NPO.  Tube feeds qid with water flushes.  8. Klebsiella PNA bacteremia/UTI:  Completed course of cefdinir IV on 08/02/2019. 9. Bilateral air space disease/Rhino virus: On Pulmicort bid,guifenesin qid (Mucinexcannot be crushed), duo nebs qid  10. 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  -Continue NPO with tube feedson osmolyte     bolus tube feeds.   Added water flushes  Improving 11. T2DM with hyperglycemia:Monitor BSevery 4 hours with SSI and May need to resume lantus  Lantus increased to 15u 10/15 CBG (last 3)  Recent Labs    08/04/19 2117 08/05/19 0614 08/05/19 1220  GLUCAP 275* 187* 165*    Labile on 10/17, monitor for trend 12 HTN: Monitor BP tid--currently controlled off medications.  Labile on 10/17, monitor for trend   Vitals:   08/05/19 0501 08/05/19 1019  BP: (!) 154/88   Pulse: 84 95  Resp: 18 18  Temp: 98 F (36.7 C)   SpO2: 99% 98%   13. OSA: Continue CPAP whenever asleep. He's compliant 14. Anemia of chronic illness:   Hemoglobin  12.0 on 10/12  Continue to monitor  15.  Leukocytosis: Resolved  WBCs down to 7.2 10/12  Afebrile  Continue to monitor  16. Urinary retention:   Off urecholine at this time.   Continue proscar.   Cannot use flomax because cannot be crushed.  PVRs low-DC'd, continue oob to void 17.  Morbidly obese  Have encouraged weight loss  LOS: 10 days A FACE TO FACE EVALUATION WAS PERFORMED  Michael Bright 08/05/2019, 12:32 PM

## 2019-08-05 NOTE — Progress Notes (Signed)
Pt placed self on CPAP for the night. Will monitor 

## 2019-08-05 NOTE — Progress Notes (Signed)
Speech Language Pathology Daily Session Note  Patient Details  Name: Michael Bright MRN: [id number] Date of Birth: 1956/02/13  Today's Date: 08/05/2019 SLP Individual Time: 8182-9937 SLP Individual Time Calculation (min): 52 min  Short Term Goals: Week 2: SLP Short Term Goal 1 (Week 2): STGs=LTGs due to ELOS  Skilled Therapeutic Interventions: Pt was seen for skilled ST targeting dysphagia and cognitive goals. Pt exhibited wet vocal quality and intermittent coughing s/p difficulty managing secretions at baseline, however pt was independent with use of suction for secretion management. SLP facilitated session with functional conversation with opportunities for pt to recall newly learned skills and goals in various therapies, which pt verbally called with no more than Supervision A question cues. He also completed 30 reps of EMST exercises with device set to 50 cm H2O with Min A verbal cues for accuracy - specifically, pt required cues to remember when to take rest breaks (increments of 10). He recalled pharyngeal strengthening exercises (effortful, CTAR, masako, mendelsohn) Mod I and completed 10 reps of each with no more than Supervision A verbal cues for accuracy. Following completion of thorough oral care with suction, SLP also presented 6 ice chip trials, during which pt was Mod I for use of an extra swallow and use of suction as appropriate during intake. Pt's vocal quality remained wet both at rest and ice chip consumption, although no immediate cough was exhibited with ice. Pt was left sitting in wheelchair with seat alarm activated, all needs within reach. Continue per current plan of care.       Pain Pain Assessment Pain Scale: 0-10 Pain Score: 0-No pain Pain Type: Neuropathic pain Pain Location: Wrist Pain Orientation: Left  Therapy/Group: Individual Therapy   Michael Bright 08/05/2019, 9:27 AM

## 2019-08-06 ENCOUNTER — Inpatient Hospital Stay (HOSPITAL_COMMUNITY): Payer: Medicare Other | Admitting: Speech Pathology

## 2019-08-06 ENCOUNTER — Inpatient Hospital Stay (HOSPITAL_COMMUNITY): Payer: Medicare Other

## 2019-08-06 LAB — GLUCOSE, CAPILLARY
Glucose-Capillary: 165 mg/dL — ABNORMAL HIGH (ref 70–99)
Glucose-Capillary: 248 mg/dL — ABNORMAL HIGH (ref 70–99)
Glucose-Capillary: 127 mg/dL — ABNORMAL HIGH (ref 70–99)
Glucose-Capillary: 132 mg/dL — ABNORMAL HIGH (ref 70–99)

## 2019-08-06 MED ORDER — INSULIN GLARGINE 100 UNIT/ML ~~LOC~~ SOLN
10.0000 [IU] | Freq: Two times a day (BID) | SUBCUTANEOUS | Status: DC
  Administered 2019-08-06 – 2019-08-08 (×4): 10 [IU] via SUBCUTANEOUS
  Filled 2019-08-06 (×5): qty 0.1

## 2019-08-06 NOTE — Progress Notes (Signed)
Came to room for breathing tx, pt is out of room currently.

## 2019-08-06 NOTE — Progress Notes (Signed)
Occupational Therapy Discharge Summary  Patient Details  Name: Michael Bright Treat MRN: [id number] Date of Birth: 05-15-1956   Patient has met 77 of 10 long term goals due to improved activity tolerance, improved balance, postural control, ability to compensate for deficits, functional use of  LEFT upper extremity, improved attention, improved awareness and improved coordination.  Patient to discharge at overall Supervision level.  Patient's care partner is independent to provide the necessary physical and cognitive assistance at discharge. Pt's wife feels comfortable with previously provided edu from last admission.   Reasons goals not met: All treatment goals met.   Recommendation:  Patient will benefit from ongoing skilled OT services in home health setting to continue to advance functional skills in the area of BADL and iADL.  Equipment: No equipment provided, pt has all equipment from previous admission.   Reasons for discharge: treatment goals met and discharge from hospital  Patient/family agrees with progress made and goals achieved: Yes  OT Discharge Precautions/Restrictions  Precautions Precautions: Fall Precaution Comments: PEG; NPO Required Braces or Orthoses: Other Brace Other Brace: L AFO Restrictions Weight Bearing Restrictions: No Pain   ADL ADL Eating: NPO Grooming: Modified independent Where Assessed-Grooming: Wheelchair Upper Body Bathing: Setup Where Assessed-Upper Body Bathing: Sitting at sink Lower Body Bathing: Contact guard Where Assessed-Lower Body Bathing: Standing at sink, Sitting at sink Upper Body Dressing: Setup Where Assessed-Upper Body Dressing: Sitting at sink Lower Body Dressing: Contact guard Where Assessed-Lower Body Dressing: Sitting at sink, Standing at sink Toileting: Contact guard Where Assessed-Toileting: Glass blower/designer: Therapist, music Method: Counselling psychologist: Bedside commode, Sales promotion account executive: (used the [employer]) Vision Baseline Vision/History: Wears glasses Wears Glasses: Distance only Patient Visual Report: No change from baseline Vision Assessment?: No apparent visual deficits Perception  Perception: Within Functional Limits Praxis Praxis: Intact Cognition Overall Cognitive Status: Impaired/Different from baseline Arousal/Alertness: Awake/alert Orientation Level: Oriented X4 Attention: Selective Focused Attention: Appears intact Sustained Attention: Appears intact Memory: Impaired Memory Impairment: Storage deficit;Decreased recall of new information Awareness: Impaired Awareness Impairment: Anticipatory impairment Problem Solving: Impaired Problem Solving Impairment: Functional complex Safety/Judgment: Appears intact Sensation Sensation Light Touch: Appears Intact Hot/Cold: Appears Intact Proprioception: Appears Intact Stereognosis: Appears Intact Coordination Gross Motor Movements are Fluid and Coordinated: No Fine Motor Movements are Fluid and Coordinated: No Coordination and Movement Description: impaired in LLE/LUE due to weakness Motor    Mobility  Bed Mobility Bed Mobility: Rolling Left;Supine to Sit Rolling Left: Supervision/Verbal cueing Supine to Sit: Supervision/Verbal cueing Sit to Supine: Supervision/Verbal cueing Transfers Sit to Stand: Contact Guard/Touching assist Stand to Sit: Contact Guard/Touching assist  Trunk/Postural Assessment  Cervical Assessment Cervical Assessment: Exceptions to WFL(forward head) Thoracic Assessment Thoracic Assessment: Exceptions to WFL(kyphotic posture) Lumbar Assessment Lumbar Assessment: Exceptions to WFL(posterior pelvic tilt, poor anterior weight shift) Postural Control Postural Control: Deficits on evaluation Righting Reactions: delayed Protective Responses: delayed  Balance Balance Balance Assessed: Yes Static Sitting Balance Static Sitting - Balance Support: Feet  supported Static Sitting - Level of Assistance: 6: Modified independent (Device/Increase time) Dynamic Sitting Balance Dynamic Sitting - Balance Support: During functional activity;Feet supported Dynamic Sitting - Level of Assistance: 5: Stand by assistance Static Standing Balance Static Standing - Balance Support: During functional activity;Bilateral upper extremity supported Static Standing - Level of Assistance: 5: Stand by assistance Dynamic Standing Balance Dynamic Standing - Balance Support: During functional activity;Bilateral upper extremity supported Dynamic Standing - Level of Assistance: 5: Stand by assistance(CGA) Extremity/Trunk Assessment RUE Assessment RUE Assessment: Within Functional  Limits LUE Assessment LUE Assessment: Exceptions to [facility] Active Range of Motion (AROM) Comments: Shoulder flexion to ~70 degrees. Painful with AROM.   Curtis Sites 08/06/2019, 5:17 PM

## 2019-08-06 NOTE — Progress Notes (Signed)
Speech Language Pathology Daily Session Note  Patient Details  Name: Michael Bright MRN: [id number] Date of Birth: 1956-02-01  Today's Date: 08/06/2019 SLP Individual Time: 1027-2536 SLP Individual Time Calculation (min): 42 min  Short Term Goals: Week 2: SLP Short Term Goal 1 (Week 2): STGs=LTGs due to ELOS  Skilled Therapeutic Interventions:  Pt was seen for skilled ST targeting dysphagia goals.  SLP facilitated the session with trials of ice chips to continue working towards initiation of PO diet.  Pt demonstrated slight increase in wet vocal quality and increase in coughing with trials of ice chips.  He has good awareness as to the nature of his swallowing deficits and can explain their symptoms and etiology quite clearly.  He also demonstrates appropriate safety awareness with PO trials as evidenced by slow rate and small bolus size.  Pt could return demonstrate his pharyngeal strengthening exercise regimen with mod I and completed 25 repetitions of his RMST exercise program with mod I and a self perceived effort level of 5-6/10.   Pt was left in wheelchair with all needs within reach.  Continue per current plan of care.     Pain Pain Assessment Pain Scale: 0-10 Pain Score: 0-No pain  Therapy/Group: Individual Therapy  Kersti Scavone, Selinda Orion 08/06/2019, 12:09 PM

## 2019-08-06 NOTE — Progress Notes (Signed)
Occupational Therapy Session Note  Patient Details  Name: Tashan Kreitzer MRN: [id number] Date of Birth: 01-Nov-1955  Today's Date: 08/06/2019 OT Individual Time: 3614-4315 OT Individual Time Calculation (min): 75 min    Short Term Goals: Week 2:  OT Short Term Goal 2 (Week 2): STG = LTG d/t ELOS  Skilled Therapeutic Interventions/Progress Updates:    Pt received supine with c/o pain 8/10 in L shoulder, RN Dauda administering medication for pain relief and in room. Pt agreeable to session. Discussed d/c planning and UTI s/s. Pt donned shoes EOB with min A for donning ted hose. Pt completed 150 ft of functional mobility with CGA to the therapy gym. Pt completed standing level side stepping to challenge dynamic standing balance and strength of hip abductors. Activity graded with differing UE support on parallel bar, occasional min A. Pt completed calf raises to promote anterior weight shift when completing sit > stands. Lateral support provided at L ankle to decrease pronation. Pt completed stnading level hip abduction and hip extension to strength glute med and max. Pt completed closed chain BUE functional reaching with 2 lb dowel to strengthen painful LUE. Pt completed sit > stands from EOM with wedge placed under his B heels to promote anterior weight shift during standing, with additional cueing provided for body mechanics. Min A overall provided for 2x 5 repetitions. Pt returned to his room and was left sitting up in the w/c with chair pad alarm set, all needs within reach.   Therapy Documentation Precautions:  Precautions Precautions: Fall Precaution Comments: PEG; NPO Required Braces or Orthoses: Other Brace Other Brace: L AFO Restrictions Weight Bearing Restrictions: No    Therapy/Group: Individual Therapy  Curtis Sites 08/06/2019, 7:14 AM

## 2019-08-06 NOTE — Progress Notes (Signed)
[facility] PHYSICAL MEDICINE & REHABILITATION PROGRESS NOTE  Subjective/Complaints: Patient seen sitting up in bed this morning.  States he slept well overnight.  He is looking forward to going home soon.  Notes improvement with the left shoulder.  ROS: Denies CP, SOB, N/V/D  Objective: Vital Signs: Blood pressure 124/85, pulse 89, temperature (!) 97.4 F (36.3 C), resp. rate 18, height 6' (1.829 m), weight 135.9 kg, SpO2 99 %. No results found. No results for input(s): WBC, HGB, HCT, PLT in the last 72 hours. No results for input(s): NA, K, CL, CO2, GLUCOSE, BUN, CREATININE, CALCIUM in the last 72 hours.  Physical Exam: BP 124/85 (BP Location: Left Arm)   Pulse 89   Temp (!) 97.4 F (36.3 C)   Resp 18   Ht 6' (1.829 m)   Wt 135.9 kg   SpO2 99%   BMI 40.63 kg/m  Constitutional: No distress . Vital signs reviewed.  Morbidly obese. HENT: Normocephalic.  Atraumatic. Eyes: EOMI. No discharge. Cardiovascular: No JVD. Respiratory: Normal effort.  No stridor. GI: Non-distended. Skin: Warm and dry.  Intact. Psych: Normal mood.  Normal behavior. Musc: Lower extremity edema, no tenderness to left shoulder. Neuro: Alert Motor: Grossly 4+/5 throughout prox to distal, stable. Dysphonia, unchanged  Assessment/Plan: 1. Functional deficits secondary to debility which require 3+ hours per day of interdisciplinary therapy in a comprehensive inpatient rehab setting.  Physiatrist is providing close team supervision and 24 hour management of active medical problems listed below.  Physiatrist and rehab team continue to assess barriers to discharge/monitor patient progress toward functional and medical goals  Care Tool:  Bathing    Body parts bathed by patient: Right arm, Left arm, Chest, Abdomen, Buttocks, Front perineal area, Right upper leg, Left upper leg, Right lower leg, Left lower leg, Face   Body parts bathed by helper: Front perineal area, Buttocks, Right lower leg, Left lower  leg     Bathing assist Assist Level: Contact Guard/Touching assist     Upper Body Dressing/Undressing Upper body dressing   What is the patient wearing?: Pull over shirt    Upper body assist Assist Level: Supervision/Verbal cueing    Lower Body Dressing/Undressing Lower body dressing      What is the patient wearing?: Pants, Underwear/pull up     Lower body assist Assist for lower body dressing: Contact Guard/Touching assist     Toileting Toileting    Toileting assist Assist for toileting: Maximal Assistance - Patient 25 - 49%     Transfers Chair/bed transfer  Transfers assist  Chair/bed transfer activity did not occur: Safety/medical concerns  Chair/bed transfer assist level: Minimal Assistance - Patient > 75% Chair/bed transfer assistive device: Museum/gallery exhibitions officer assist      Assist level: Contact Guard/Touching assist Assistive device: Walker-rolling Max distance: 150   Walk 10 feet activity   Assist     Assist level: Contact Guard/Touching assist Assistive device: Walker-rolling   Walk 50 feet activity   Assist Walk 50 feet with 2 turns activity did not occur: Safety/medical concerns  Assist level: Contact Guard/Touching assist Assistive device: Walker-rolling    Walk 150 feet activity   Assist Walk 150 feet activity did not occur: Safety/medical concerns  Assist level: Contact Guard/Touching assist Assistive device: Walker-rolling    Walk 10 feet on uneven surface  activity   Assist Walk 10 feet on uneven surfaces activity did not occur: Safety/medical concerns         Wheelchair  Assist Will patient use wheelchair at discharge?: Yes Type of Wheelchair: Manual    Wheelchair assist level: Supervision/Verbal cueing Max wheelchair distance: 100 ft    Wheelchair 50 feet with 2 turns activity    Assist        Assist Level: Supervision/Verbal cueing   Wheelchair 150 feet activity      Assist Wheelchair 150 feet activity did not occur: Safety/medical concerns         Medical Problem List and Plan: 1. Debility secondary to septic shock from UTI and Klebsiella pneumonia.bacteremia with rhinovirus.  Continue CIR 2. Antithrombotics: -DVT/anticoagulation:Pharmaceutical:Heparin -antiplatelet therapy: ASA/Plavix. 3. Pain Management:tylenol prn.  Continue Lidoderm patch for left shoulder    Robaxin 500 3 times daily as needed    -continue kpad also  -subacromial shoulder injection 10/15 with good benefit   -continue ROM/exercises with therapy  Controlled on 10/18 4. Mood:LCSW to follow for evaluation and support. -antipsychotic agents: N/A 5. Neuropsych: This patientiscapable of making decisions on hisown behalf. 6. Skin/Wound Care:Routine pressure relief measures. 7. Fluids/Electrolytes/Nutrition:NPO.  Tube feeds qid with water flushes.   BMP ordered for tomorrow 8. Klebsiella PNA bacteremia/UTI:  Completed course of cefdinir IV on 08/02/2019. 9. Bilateral air space disease/Rhino virus: On Pulmicort bid,guifenesin qid (Mucinexcannot be crushed), duo nebs qid  10. 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  -Continue NPO with tube feedson osmolyte     bolus tube feeds.   Added water flushes  Improving overall 11. T2DM with hyperglycemia:Monitor BSevery 4 hours with SSI and May need to resume lantus  Lantus increased to 15u 10/15, changed to 10 twice daily on 10/18 CBG (last 3)  Recent Labs    08/05/19 1654 08/05/19 2142 08/06/19 0736  GLUCAP 209* 255* 248*    Labile and elevated on 10/18 12 HTN: Monitor BP tid--currently controlled off medications.  Slightly labile on 10/18   Vitals:   08/05/19 1959 08/06/19 0619  BP: (!) 143/74 124/85  Pulse: 95 89  Resp: 18 18  Temp: (!) 97.5 F (36.4 C) (!) 97.4 F (36.3 C)  SpO2: 98%  99%   13. OSA: Continue CPAP whenever asleep. He's compliant 14. Anemia of chronic illness:   Hemoglobin 12.0 on 10/12, labs ordered for tomorrow  Continue to monitor  15.  Leukocytosis: Resolved  WBCs down to 7.2 10/12  Afebrile  Continue to monitor  16. Urinary retention:   Off urecholine at this time.   Continue proscar.   Cannot use flomax because cannot be crushed.  PVRs low-DC'd, continue oob to void 17.  Morbidly obese  Have encouraged weight loss  LOS: 11 days A FACE TO FACE EVALUATION WAS PERFORMED  Ankit Karis Juba 08/06/2019, 10:09 AM

## 2019-08-07 ENCOUNTER — Inpatient Hospital Stay (HOSPITAL_COMMUNITY): Payer: Medicare Other

## 2019-08-07 ENCOUNTER — Inpatient Hospital Stay (HOSPITAL_COMMUNITY): Payer: Medicare Other | Admitting: Speech Pathology

## 2019-08-07 ENCOUNTER — Inpatient Hospital Stay (HOSPITAL_COMMUNITY): Payer: Medicare Other | Admitting: Physical Therapy

## 2019-08-07 ENCOUNTER — Other Ambulatory Visit: Payer: Self-pay | Admitting: Physical Medicine and Rehabilitation

## 2019-08-07 LAB — CBC
HCT: 36.7 % — ABNORMAL LOW (ref 39.0–52.0)
Hemoglobin: 12.2 g/dL — ABNORMAL LOW (ref 13.0–17.0)
MCH: 30.3 pg (ref 26.0–34.0)
MCHC: 33.2 g/dL (ref 30.0–36.0)
MCV: 91.3 fL (ref 80.0–100.0)
Platelets: 355 10*3/uL (ref 150–400)
RBC: 4.02 MIL/uL — ABNORMAL LOW (ref 4.22–5.81)
RDW: 12.9 % (ref 11.5–15.5)
WBC: 7.3 10*3/uL (ref 4.0–10.5)
nRBC: 0 % (ref 0.0–0.2)

## 2019-08-07 LAB — GLUCOSE, CAPILLARY
Glucose-Capillary: 154 mg/dL — ABNORMAL HIGH (ref 70–99)
Glucose-Capillary: 232 mg/dL — ABNORMAL HIGH (ref 70–99)
Glucose-Capillary: 158 mg/dL — ABNORMAL HIGH (ref 70–99)
Glucose-Capillary: 124 mg/dL — ABNORMAL HIGH (ref 70–99)
Glucose-Capillary: 160 mg/dL — ABNORMAL HIGH (ref 70–99)
Glucose-Capillary: 186 mg/dL — ABNORMAL HIGH (ref 70–99)

## 2019-08-07 LAB — BASIC METABOLIC PANEL
Anion gap: 9 (ref 5–15)
BUN: 23 mg/dL (ref 8–23)
CO2: 27 mmol/L (ref 22–32)
Calcium: 9 mg/dL (ref 8.9–10.3)
Chloride: 100 mmol/L (ref 98–111)
Creatinine, Ser: 0.75 mg/dL (ref 0.61–1.24)
GFR calc Af Amer: >60 mL/min (ref >60)
GFR calc non Af Amer: >60 mL/min (ref >60)
Glucose, Bld: 207 mg/dL — ABNORMAL HIGH (ref 70–99)
Potassium: 4.2 mmol/L (ref 3.5–5.1)
Sodium: 136 mmol/L (ref 135–145)

## 2019-08-07 MED ORDER — FREE WATER
250.0000 mL | Freq: Three times a day (TID) | Status: DC
  Administered 2019-08-07 – 2019-08-08 (×3): 250 mL

## 2019-08-07 MED ORDER — OSMOLITE 1.5 CAL PO LIQD: 325.0000 mL | Freq: Four times a day (QID) | ORAL | 0 refills | Status: AC

## 2019-08-07 NOTE — Progress Notes (Signed)
Speech Language Pathology Discharge Summary  Patient Details  Name: Michael Bright MRN: [id number] Date of Birth: 01/01/1956  Today's Date: 08/07/2019 SLP Individual Time: 0930-1000 SLP Individual Time Calculation (min): 30 min   Skilled Therapeutic Interventions:  Skilled treatment session focused on dysphagia goals and completion of education. SLP facilitated session by providing supervision level verbal cues for accuracy during completion of RMST and pharyngeal strengthening exercises. SLP also reinforced education in regards to need to remain NPO at home, continue RMT and pharyngeal strengthening exercises, importance of oral care 4 times per day via the suction toothbrush and s/s of aspiration PNA. He verbalized and demonstrated understanding. Patient left upright in wheelchair with all needs within reach. Continue with current plan of care.   Patient has met 3 of 3 long term goals.  Patient to discharge at overall Supervision-Mod I level.   Reasons goals not met: N/A   Clinical Impression/Discharge Summary: Patient has made functional gains and has met 3 of 3 LTGs this admission. Currently, patient is overall Mod I for recall of his pharyngeal strengthening exercises and RMST exercises but may require an intermittent verbal cue for accuracy during completion of exercises at times. Patient remains NPO and continues to demonstrate an intermittent wet vocal quality due to poor management of secretions with continued difficulty clearing secretions despite multiple attempts. Patient also continues to demonstrate overt s/s of aspiration with trials of ice chips. Patient demonstrates improved recall of daily information and is overall Mod I for use of external aids. Patient education is complete and patient will discharge home tomorrow with supervision from family. Patient would benefit from f/u SLP intervention to maximize his swallowing function. Recommend to continue RMT and utilization of vital  stim if available.   Care Partner:  Caregiver Able to Provide Assistance: Yes  Type of Caregiver Assistance: Physical  Recommendation:  Home Health SLP;24 hour supervision/assistance  Rationale for SLP Follow Up: Maximize swallowing safety;Reduce caregiver burden   Equipment: Suction   Reasons for discharge: Treatment goals met;Discharged from hospital   Patient/Family Agrees with Progress Made and Goals Achieved: Yes    [place], [place] 08/07/2019, 6:30 AM

## 2019-08-07 NOTE — Telephone Encounter (Signed)
Patient currently admitted

## 2019-08-07 NOTE — Progress Notes (Signed)
Physical Therapy Session Note  Patient Details  Name: Michael Bright MRN: [id number] Date of Birth: 30-Jul-1956  Today's Date: 08/07/2019 PT Individual Time: 1100-1200 PT Individual Time Calculation (min): 60 min   Short Term Goals: Week 1:  PT Short Term Goal 1 (Week 1): Pt will be able to perform functional bed mobility with min assist PT Short Term Goal 1 - Progress (Week 1): Met PT Short Term Goal 2 (Week 1): Pt will be able to perform basic transfers with min assist PT Short Term Goal 2 - Progress (Week 1): Met PT Short Term Goal 3 (Week 1): Pt will be able to gait x 50' with min assist PT Short Term Goal 3 - Progress (Week 1): Met PT Short Term Goal 4 (Week 1): Pt will be able to initiate stair training PT Short Term Goal 4 - Progress (Week 1): Met Week 2:  PT Short Term Goal 1 (Week 2): STG = LTG due to estimated d/c date.  Skilled Therapeutic Interventions/Progress Updates:   Received pt sitting upright in WC. Pt agreeable to PT and stated minor pain in L wrist but did not state pain number. Pt states wrist pain is sharp with pronation/supination. Session focused on functional mobility/transfers, ambulation, LE strength, balance, and improved tolerance to activity. Pt ambulated 171f with RW supervision. Pt demonstrated decreased step length and poor foot clearance on LLE and mild flexed trunk. Pt performed bed mobility in rehab apartment with supervision. Pt performed car transfer x 2 trials with CGA and verbal cueing to turn prior to sitting and for RW safety. Pt ambulated ~164fx2 up/down ramp with RW CGA. Pt performed 5x sit<>stand in 1 min 8 sec using RUE only to push from [facility] in front of pt, but not used. Pt performed TUG with RW CGA with average of 37.6 sec. Pt reported most difficult part of task was turning; PT encouraged blocked practicing of turns. Pt performed 90 degree R turns from WC<>mat using RW x2 trials CGA. Pt performed 180 degree R turns from WC<>mat using RW x 6  trials CGA. Pt performed 180 degree L turns from WC<>mat using RW x 2 trials CGA. Pt reported he was able to control the LLE better when turning to the R. Pt ambulated 10080fith RW CGA with dual task challenge of vertical and horizontal head turns and naming objects in categories. Pt initially demonstrated decreased gait speed with these additional challenges. Concluded session with pt sitting in WC, needs within reach, and chair pad alarm on.   Therapy Documentation Precautions:  Precautions Precautions: Fall Precaution Comments: PEG; NPO Required Braces or Orthoses: Other Brace Other Brace: L AFO Restrictions Weight Bearing Restrictions: No   Therapy/Group: Individual Therapy  [place], DPT   CarPage SpiroT, DPT 08/07/2019, 11:21 AM

## 2019-08-07 NOTE — Progress Notes (Signed)
[facility] PHYSICAL MEDICINE & REHABILITATION PROGRESS NOTE  Subjective/Complaints: No new issues. Left arm feels good. Able to manage his cough and secretions better esp when he's sitting up  ROS: Patient denies fever, rash, sore throat, blurred vision, nausea, vomiting, diarrhea, cough, shortness of breath or chest pain, joint or back pain, headache, or mood change.   Objective: Vital Signs: Blood pressure 129/77, pulse 87, temperature 98 F (36.7 C), resp. rate 17, height 6' (1.829 m), weight 134.8 kg, SpO2 100 %. No results found. Recent Labs    08/07/19 0737  WBC 7.3  HGB 12.2*  HCT 36.7*  PLT 355   Recent Labs    08/07/19 0737  NA 136  K 4.2  CL 100  CO2 27  GLUCOSE 207*  BUN 23  CREATININE 0.75  CALCIUM 9.0    Physical Exam: BP 129/77 (BP Location: Left Arm)   Pulse 87   Temp 98 F (36.7 C)   Resp 17   Ht 6' (1.829 m)   Wt 134.8 kg   SpO2 100%   BMI 40.30 kg/m  Constitutional: No distress . Vital signs reviewed. HEENT: EOMI, oral membranes moist Neck: supple Cardiovascular: RRR without murmur. No JVD    Respiratory: CTA Bilaterally without wheezes or rales. Normal effort    GI: BS +, non-tender, non-distended  Skin: Warm and dry.  Intact. Psych: Normal mood.  Normal behavior. Musc: Lower extremity edema, no tenderness to left shoulder with ROM Neuro: Alert Motor: Grossly 4+/5 throughout prox to distal, stable. Dysphonia, stable  Assessment/Plan: 1. Functional deficits secondary to debility which require 3+ hours per day of interdisciplinary therapy in a comprehensive inpatient rehab setting.  Physiatrist is providing close team supervision and 24 hour management of active medical problems listed below.  Physiatrist and rehab team continue to assess barriers to discharge/monitor patient progress toward functional and medical goals  Care Tool:  Bathing    Body parts bathed by patient: Right arm, Left arm, Chest, Abdomen, Buttocks, Front  perineal area, Right upper leg, Left upper leg, Right lower leg, Left lower leg, Face   Body parts bathed by helper: Front perineal area, Buttocks, Right lower leg, Left lower leg     Bathing assist Assist Level: Contact Guard/Touching assist     Upper Body Dressing/Undressing Upper body dressing   What is the patient wearing?: Pull over shirt    Upper body assist Assist Level: Supervision/Verbal cueing    Lower Body Dressing/Undressing Lower body dressing      What is the patient wearing?: Pants, Underwear/pull up     Lower body assist Assist for lower body dressing: Contact Guard/Touching assist     Toileting Toileting    Toileting assist Assist for toileting: Contact Guard/Touching assist     Transfers Chair/bed transfer  Transfers assist  Chair/bed transfer activity did not occur: Safety/medical concerns  Chair/bed transfer assist level: Contact Guard/Touching assist Chair/bed transfer assistive device: Geologist, engineering   Ambulation assist      Assist level: Contact Guard/Touching assist Assistive device: Walker-rolling Max distance: 150   Walk 10 feet activity   Assist     Assist level: Contact Guard/Touching assist Assistive device: Walker-rolling   Walk 50 feet activity   Assist Walk 50 feet with 2 turns activity did not occur: Safety/medical concerns  Assist level: Contact Guard/Touching assist Assistive device: Walker-rolling    Walk 150 feet activity   Assist Walk 150 feet activity did not occur: Safety/medical concerns  Assist level:  Contact Guard/Touching assist Assistive device: Walker-rolling    Walk 10 feet on uneven surface  activity   Assist Walk 10 feet on uneven surfaces activity did not occur: Safety/medical concerns         Wheelchair     Assist Will patient use wheelchair at discharge?: Yes Type of Wheelchair: Manual    Wheelchair assist level: Supervision/Verbal cueing Max wheelchair  distance: 100 ft    Wheelchair 50 feet with 2 turns activity    Assist        Assist Level: Supervision/Verbal cueing   Wheelchair 150 feet activity     Assist Wheelchair 150 feet activity did not occur: Safety/medical concerns         Medical Problem List and Plan: 1. Debility secondary to septic shock from UTI and Klebsiella pneumonia.bacteremia with rhinovirus.  Continue CIR  -elos 10/20  -Patient to see Rehab MD/provider in the office for transitional care encounter in 1-2 weeks.  2. Antithrombotics: -DVT/anticoagulation:Pharmaceutical:Heparin -antiplatelet therapy: ASA/Plavix. 3. Pain Management:tylenol prn.  Continue Lidoderm patch for left shoulder    Robaxin 500 3 times daily as needed    -subacromial shoulder injection 10/15 with good benefit   -continue ROM/exercises with therapy   4. Mood:LCSW to follow for evaluation and support. -antipsychotic agents: N/A 5. Neuropsych: This patientiscapable of making decisions on hisown behalf. 6. Skin/Wound Care:Routine pressure relief measures. 7. Fluids/Electrolytes/Nutrition:NPO.  Tube feeds qid with water flushes.   BMP reviewed 10/19, BUN trending up a bit. Increase h20 flushes in PEG 8. Klebsiella PNA bacteremia/UTI:  Completed course of cefdinir IV on 08/02/2019. 9. Bilateral air space disease/Rhinovirus: On Pulmicort bid,guifenesin qid (Mucinexcannot be crushed), duo nebs qid  10. 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  -Continue NPO with tube feedson osmolyte     bolus tube feeds.   -coughing strategies reviewed, pt using regularly 11. T2DM with hyperglycemia:Monitor BSevery 4 hours with SSI and May need to resume lantus  Lantus increased to 15u 10/15, changed to 10 twice daily on 10/18 CBG (last 3)  Recent Labs    08/06/19 1755 08/06/19 2132 08/07/19 0614  GLUCAP 132* 232*  158*    Observe for trend today as insulin just incr 12 HTN: Monitor BP tid--currently controlled off medications.      Vitals:   08/06/19 1938 08/07/19 0436  BP:  129/77  Pulse:  87  Resp:  17  Temp:  98 F (36.7 C)  SpO2: 98% 100%   13. OSA: Continue CPAP whenever asleep. He's compliant 14. Anemia of chronic illness:   Hemoglobin 12.2 10/19  Continue to monitor  15.  Leukocytosis: Resolved  WBCs down to 7.3 10/19  Afebrile  Continue to monitor  16. Urinary retention:   Off urecholine at this time.   Continue proscar.   Cannot use flomax because cannot be crushed.  Voiding well 17.  Morbidly obese  Have encouraged weight loss  LOS: 12 days A FACE TO FACE EVALUATION WAS PERFORMED  Meredith Staggers 08/07/2019, 10:18 AM

## 2019-08-07 NOTE — Progress Notes (Signed)
Occupational Therapy Session Note  Patient Details  Name: Michael Bright MRN: [id number] Date of Birth: [birth year]  Today's Date: 08/07/2019 OT Individual Time: 1300-1345 OT Individual Time Calculation (min): 45 min    Short Term Goals: Week 2:  OT Short Term Goal 2 (Week 2): STG = LTG d/t ELOS  Skilled Therapeutic Interventions/Progress Updates:    OT intervention with focus on sit<>stand, standing balance on compliant and noncompliant surface, functional amb with RW, and BUE therex.  Pt engaged in standing tasks on Airex using BUE for funcitonal tasks (folding towels, etc.). Pt also retrieved items above 90 degrees with LUE and transferred to RUE while standing on AIrex.  Pt amb with RW to place towels in dirty line bag.  Pt engaged in BUE therex on [employer] (5 mins and 3 mins in opposite direction at level 3 with RPM of 45). Pt returned to room and remained in w/c with seat alarm activated.   Therapy Documentation Precautions:  Precautions Precautions: Fall Precaution Comments: PEG; NPO Required Braces or Orthoses: Other Brace Other Brace: L AFO Restrictions Weight Bearing Restrictions: No   Pain:  Pt denies pain this afternoon   Therapy/Group: Individual Therapy  Leroy Libman 08/07/2019, 1:49 PM

## 2019-08-07 NOTE — Progress Notes (Signed)
Physical Therapy Discharge Summary  Patient Details  Name: Michael Bright MRN: [id number] Date of Birth: 1956-09-04  Today's Date: 08/07/2019 PT Individual Time: 1446-1540 PT Individual Time Calculation (min): 54 min    Patient has met 7 of 7 long term goals due to improved activity tolerance, improved balance, improved postural control, increased strength, ability to compensate for deficits and improved coordination.  Patient to discharge at an ambulatory level CGA with RW & L AFO.   Patient's care partner is independent to provide the necessary physical and cognitive assistance at discharge, as pt is discharging at same level of care he discharged during last admission & family participated in caregiver training prior to d/c the first time.  Reasons goals not met: n/a  Recommendation:  Patient will benefit from ongoing skilled PT services in home health setting to continue to advance safe functional mobility, address ongoing impairments in balance, endurance, transfers, gait, stair negotiation, LLE NMR, and minimize fall risk.  Equipment: No equipment provided  Reasons for discharge: treatment goals met and discharge from hospital  Patient/family agrees with progress made and goals achieved: Yes  Skilled PT Treatment: Pt received in w/c & agreeable to tx. Pt transfers sit<>stand throughout session from w/c & mat table with supervision. Pt ambulates room>dayroom>apartment>room with RW, L AFO, & CGA. From EOM in dayroom pt performed standing cone taps with BUE support on RW>RUE support with min assist for balance with task focusing on weight shifting, dynamic balance, and LLE NMR & coordination. Manual muscle testing completed, see below. In apartment, pt completed transfer from low, compliant couch with min assist for sit>stand. Back in room pt performed the following BLE standing exercises for strengthening with BUE on RW: marches, mini squats, and heel raises. At end of session pt left in  w/c with chair alarm donned & all needs at hand.  Pain: pt c/o unrated pain in L wrist with rest breaks provided PRN.   PT Discharge Precautions/Restrictions Precautions Precautions: Fall Precaution Comments: PEG; NPO Other Brace: L AFO Restrictions Weight Bearing Restrictions: No  Vision/Perception  Pt wears glasses for distance only at baseline. No changes in baseline vision. No apparent visual deficits.  Perception WNL. Praxis intact.   Cognition Overall Cognitive Status: Impaired/Different from baseline Arousal/Alertness: Awake/alert Orientation Level: Oriented X4 Focused Attention: Appears intact Sustained Attention: Appears intact Memory: Impaired Memory Impairment: Decreased recall of new information Awareness: Impaired Awareness Impairment: Anticipatory impairment Safety/Judgment: Appears intact  Sensation Sensation Light Touch: Impaired Detail(pt reports numbness/tingling in R hand) Coordination Heel Shin Test: BLE equal, some LLE ataxia during gait   Motor  Motor Motor: Abnormal postural alignment and control;Hemiplegia Motor - Discharge Observations: generalized debility; 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, LLE ataxia   Mobility Bed Mobility Bed Mobility: Rolling Left;Supine to Sit Rolling Left: Supervision/Verbal cueing Supine to Sit: Supervision/Verbal cueing Sit to Supine: Supervision/Verbal cueing Transfers Transfers: Sit to Stand;Stand to Sit Sit to Stand: Supervision/Verbal cueing Stand to Sit: Supervision/Verbal cueing Stand Pivot Transfer Details: Verbal cues for technique;Verbal cues for precautions/safety Transfer (Assistive device): Rolling walker  Locomotion  Gait Ambulation: Yes Gait Assistance: Contact Guard/Touching assist Gait Distance (Feet): (>150 ft) Assistive device: Rolling walker(L AFO) Gait Assistance Details: Verbal cues for technique;Verbal cues for precautions/safety;Verbal cues for safe use of  DME/AE Gait Gait: Yes Gait Pattern: Impaired Gait Pattern: Decreased step length - left;Decreased stride length;Decreased stance time - left;Decreased dorsiflexion - left;Poor foot clearance - left Gait velocity: decreased Stairs / Additional Locomotion Stairs: Yes Stairs  Assistance: Contact Guard/Touching assist Stair Management Technique: Two rails Number of Stairs: 4 Height of Stairs: 6(inches) Ramp: Contact Guard/touching assist(ambulatory with RW & L AFO) Wheelchair Mobility Wheelchair Mobility: No   Trunk/Postural Assessment  Cervical Assessment Cervical Assessment: Exceptions to WFL(forward head) Thoracic Assessment Thoracic Assessment: Exceptions to WFL(kyphosis) Lumbar Assessment Lumbar Assessment: Exceptions to WFL(posterior pelvic tilt, difficulty with anterior weight shifting) Postural Control Postural Control: Deficits on evaluation Righting Reactions: delayed Protective Responses: delayed   Balance Balance Balance Assessed: Yes Standardized Balance Assessment Standardized Balance Assessment: Timed Up and Go Test Timed Up and Go Test TUG: Normal TUG(3 trials) Normal TUG (seconds): 37.6 5x sit<>stand: 1 minute 8 seconds, using RUE to push to standing from w/c.   Extremity Assessment  Per OT assessment: RUE: WFL LUE: shoulder flexion to ~70 degrees, painful with AROM  BLE hip & knee AROM WFL, BLE hip flexion = 4+/5, knee extension & flexion = 4+/5   Waunita Schooner, PT, DPT 08/07/2019, 3:44 PM

## 2019-08-07 NOTE — Progress Notes (Signed)
Physical Therapy Session Note  Patient Details  Name: Michael Bright MRN: [id number] Date of Birth: August 25, 1956  Today's Date: 08/07/2019 PT Individual Time: 1000-1030 PT Individual Time Calculation (min): 30 min   Short Term Goals: Week 1:  PT Short Term Goal 1 (Week 1): Pt will be able to perform functional bed mobility with min assist PT Short Term Goal 1 - Progress (Week 1): Met PT Short Term Goal 2 (Week 1): Pt will be able to perform basic transfers with min assist PT Short Term Goal 2 - Progress (Week 1): Met PT Short Term Goal 3 (Week 1): Pt will be able to gait x 50' with min assist PT Short Term Goal 3 - Progress (Week 1): Met PT Short Term Goal 4 (Week 1): Pt will be able to initiate stair training PT Short Term Goal 4 - Progress (Week 1): Met Week 2:  PT Short Term Goal 1 (Week 2): STG = LTG due to estimated d/c date.  Skilled Therapeutic Interventions/Progress Updates:    Session focused on gait training with RW (and L AFO), blocked practice sit <> stands for functional strengthening (supervision level x 5 reps and throughout session), and stair negotiation training for home/community access and functional strengthening/coordination. Pt performs basic transfers throughout session at supervision level with RW without need for cues for technique and demonstrates good anterior weightshift today without any intervention needed from PT except supervision for safety due to inconsistency. Pt performed several bouts of gait between 50-180' throughout session with close supervision to CGA especially as fatigues and L foot drag increases with mild instability noted during turns as fatigued. Pt endurance significantly improved and O2 remains > 96% and HR = 86 bpm with activity. Denies SOB. Stair negotiation training with bilateral rails x 4 steps with CGA and pt able to recall technique without cues verbally. Once instance of cue needed during sequencing but pt quickly realized also. Pt wanted  to get on scale, so performed CGA transfer to walk on and off scale with UE support. End of session set up at sink with suction and toothbrush to perform oral hygiene. Pt hopeful for d/c tomorrow.  Therapy Documentation Precautions:  Precautions Precautions: Fall Precaution Comments: PEG; NPO Required Braces or Orthoses: Other Brace Other Brace: L AFO Restrictions Weight Bearing Restrictions: No  Pain: Denies pain.    Therapy/Group: Individual Therapy  Canary Brim Ivory Broad, PT, DPT, CBIS  08/07/2019, 10:36 AM

## 2019-08-08 LAB — GLUCOSE, CAPILLARY
Glucose-Capillary: 144 mg/dL — ABNORMAL HIGH (ref 70–99)
Glucose-Capillary: 118 mg/dL — ABNORMAL HIGH (ref 70–99)

## 2019-08-08 MED ORDER — GUAIFENESIN 100 MG/5ML PO SOLN: 10.0000 mL | Freq: Three times a day (TID) | ORAL | 0 refills | Status: DC

## 2019-08-08 MED ORDER — FINASTERIDE 5 MG PO TABS: ORAL_TABLET | ORAL | 0 refills | Status: DC

## 2019-08-08 MED ORDER — IPRATROPIUM-ALBUTEROL 0.5-2.5 (3) MG/3ML IN SOLN: 3.0000 mL | Freq: Four times a day (QID) | RESPIRATORY_TRACT | 0 refills | Status: DC | PRN

## 2019-08-08 MED ORDER — CHLORHEXIDINE GLUCONATE 0.12 % MT SOLN: 15.0000 mL | Freq: Two times a day (BID) | OROMUCOSAL | 0 refills | Status: DC

## 2019-08-08 MED ORDER — FLUTICASONE PROPIONATE 50 MCG/ACT NA SUSP: 2.0000 | Freq: Every day | NASAL | 2 refills | Status: DC

## 2019-08-08 MED ORDER — INSULIN GLARGINE 100 UNIT/ML ~~LOC~~ SOLN: 13.0000 [IU] | Freq: Two times a day (BID) | SUBCUTANEOUS | 11 refills | Status: DC

## 2019-08-08 MED ORDER — SENNOSIDES 8.8 MG/5ML PO SYRP: 5.0000 mL | ORAL_SOLUTION | Freq: Two times a day (BID) | ORAL | 0 refills | Status: DC | PRN

## 2019-08-08 MED ORDER — GLYCOPYRROLATE 1 MG PO TABS: 2.0000 mg | ORAL_TABLET | Freq: Three times a day (TID) | ORAL | 1 refills | Status: DC

## 2019-08-08 MED ORDER — FREE WATER: 200.0000 mL | Freq: Three times a day (TID) | Status: AC

## 2019-08-08 MED ORDER — GUAIFENESIN-DM 100-10 MG/5ML PO SYRP: 15.0000 mL | ORAL_SOLUTION | Freq: Four times a day (QID) | ORAL | 0 refills | Status: DC | PRN

## 2019-08-08 MED ORDER — LIDOCAINE 5 % EX PTCH: MEDICATED_PATCH | CUTANEOUS | 0 refills | Status: DC

## 2019-08-08 MED ORDER — INSULIN GLARGINE 100 UNIT/ML ~~LOC~~ SOLN
13.0000 [IU] | Freq: Two times a day (BID) | SUBCUTANEOUS | Status: DC
  Filled 2019-08-08: qty 0.13

## 2019-08-08 NOTE — Discharge Instructions (Signed)
Inpatient Rehab Discharge Instructions  Haldon Carley Discharge date and time:  08/08/19  Activities/Precautions/ Functional Status: Activity: no lifting, driving, or strenuous exercise till cleared by MD Diet: Nothing by mouth. Need to do oral care 4-5 times a day to keep mouth clean.  Wound Care: keep PEG site clean and dry.     Functional status:  ___ No restrictions     ___ Walk up steps independently _X__ 24/7 supervision/assistance   ___ Walk up steps with assistance ___ Intermittent supervision/assistance  ___ Bathe/dress independently ___ Walk with walker     _X__ Bathe/dress with assistance ___ Walk Independently    ___ Shower independently _X__ Walk with supervision.     ___ Shower with assistance _X__ No alcohol     ___ Return to work/school ________    COMMUNITY REFERRALS UPON DISCHARGE:    Home Health:   PT     OT    RN                      Agency:  [facility] Phone: [phone]   Special Instructions: 1. Check blood sugars before giving tube feeds four times a day. 2. Need to toilet yourself after meals/every 3-4 hours when you feel pressure. Make sure to urinate right before bedtime and first thing in the morning because you do not have good sensation.    My questions have been answered and I understand these instructions. I will adhere to these goals and the provided educational materials after my discharge from the hospital.  Patient/Caregiver Signature _______________________________ Date __________  Clinician Signature _______________________________________ Date __________  Please bring this form and your medication list with you to all your follow-up doctor's appointments.

## 2019-08-08 NOTE — Progress Notes (Signed)
Social Work Discharge Note   The overall goal for the admission was met for:   Discharge location: Yes - returning home with wife who can provide 24/7 assistance.  Length of Stay: Yes - 13 days  Discharge activity level: Yes - supervision/ CGA  Home/community participation: Yes  Services provided included: MD, RD, PT, OT, SLP, RN, TR, Pharmacy and SW  Financial Services: Medicare and Private Insurance: BCBS (primary)  Follow-up services arranged: Home Health: RN, PT, OT via [facility] and Patient/Family has no preference for HH/DME agencies  Comments (or additional information):    Contact person:  Wife, Ridwan Bondy @ [phone]  Patient/Family verbalized understanding of follow-up arrangements: Yes  Individual responsible for coordination of the follow-up plan: pt  Confirmed correct DME delivered:  NA - all DME provided at prior CIR d/c    Jaden Abreu

## 2019-08-08 NOTE — Progress Notes (Signed)
Pt educated and performed noon tube feeding. Performed well, hands not as shaky. Performed well. Michael Bright

## 2019-08-08 NOTE — Progress Notes (Signed)
[facility] PHYSICAL MEDICINE & REHABILITATION PROGRESS NOTE  Subjective/Complaints: Still some cough but he's managing, sits up when he really is trying get things up and out  ROS: Patient denies fever, rash, sore throat, blurred vision, nausea, vomiting, diarrhea,  shortness of breath or chest pain, joint or back pain, headache, or mood change.   Objective: Vital Signs: Blood pressure 129/74, pulse 91, temperature 97.8 F (36.6 C), temperature source Oral, resp. rate 18, height 6' (1.829 m), weight 134.8 kg, SpO2 96 %. No results found. Recent Labs    08/07/19 0737  WBC 7.3  HGB 12.2*  HCT 36.7*  PLT 355   Recent Labs    08/07/19 0737  NA 136  K 4.2  CL 100  CO2 27  GLUCOSE 207*  BUN 23  CREATININE 0.75  CALCIUM 9.0    Physical Exam: BP 129/74 (BP Location: Right Arm)   Pulse 91   Temp 97.8 F (36.6 C) (Oral)   Resp 18   Ht 6' (1.829 m)   Wt 134.8 kg   SpO2 96%   BMI 40.30 kg/m  Constitutional: No distress . Vital signs reviewed. HEENT: EOMI, oral membranes moist Neck: supple Cardiovascular: RRR without murmur. No JVD    Respiratory: CTA Bilaterally without wheezes or rales. Normal effort. occ rhonchi  GI: BS +, non-tender, non-distended, PEG  Skin: Warm and dry.  Intact. Psych: Normal mood.  Normal behavior. Musc: Lower extremity edema, no tenderness to left shoulder with ROM Neuro: Alert Motor: Grossly 4+/5 throughout prox to distal, stable. Dysphonia, stable  Assessment/Plan: 1. Functional deficits secondary to debility which require 3+ hours per day of interdisciplinary therapy in a comprehensive inpatient rehab setting.  Physiatrist is providing close team supervision and 24 hour management of active medical problems listed below.  Physiatrist and rehab team continue to assess barriers to discharge/monitor patient progress toward functional and medical goals  Care Tool:  Bathing    Body parts bathed by patient: Right arm, Left arm, Chest,  Abdomen, Buttocks, Front perineal area, Right upper leg, Left upper leg, Right lower leg, Left lower leg, Face   Body parts bathed by helper: Front perineal area, Buttocks, Right lower leg, Left lower leg     Bathing assist Assist Level: Contact Guard/Touching assist     Upper Body Dressing/Undressing Upper body dressing   What is the patient wearing?: Pull over shirt    Upper body assist Assist Level: Supervision/Verbal cueing    Lower Body Dressing/Undressing Lower body dressing      What is the patient wearing?: Pants, Underwear/pull up     Lower body assist Assist for lower body dressing: Contact Guard/Touching assist     Toileting Toileting    Toileting assist Assist for toileting: Contact Guard/Touching assist     Transfers Chair/bed transfer  Transfers assist  Chair/bed transfer activity did not occur: Safety/medical concerns  Chair/bed transfer assist level: Supervision/Verbal cueing Chair/bed transfer assistive device: Armrests, Walker, Orthosis   Locomotion Ambulation   Ambulation assist      Assist level: Contact Guard/Touching assist Assistive device: Orthosis Max distance: >150 ft   Walk 10 feet activity   Assist     Assist level: Contact Guard/Touching assist Assistive device: Walker-rolling, Orthosis   Walk 50 feet activity   Assist Walk 50 feet with 2 turns activity did not occur: Safety/medical concerns  Assist level: Contact Guard/Touching assist Assistive device: Walker-rolling, Orthosis    Walk 150 feet activity   Assist Walk 150 feet activity did  not occur: Safety/medical concerns  Assist level: Contact Guard/Touching assist Assistive device: Walker-rolling, Orthosis    Walk 10 feet on uneven surface  activity   Assist Walk 10 feet on uneven surfaces activity did not occur: Safety/medical concerns   Assist level: Contact Guard/Touching assist Assistive device: Walker-rolling, Orthosis    Wheelchair     Assist Will patient use wheelchair at discharge?: No Type of Wheelchair: Manual Wheelchair activity did not occur: N/A(pt to d/c at ambulatory level)  Wheelchair assist level: Supervision/Verbal cueing Max wheelchair distance: 100 ft    Wheelchair 50 feet with 2 turns activity    Assist    Wheelchair 50 feet with 2 turns activity did not occur: N/A   Assist Level: Supervision/Verbal cueing   Wheelchair 150 feet activity     Assist Wheelchair 150 feet activity did not occur: N/A         Medical Problem List and Plan: 1. Debility secondary to septic shock from UTI and Klebsiella pneumonia.bacteremia with rhinovirus.  Home today  -Patient to see Rehab MD/provider in the office for transitional care encounter in 1-2 weeks.  2. Antithrombotics: -DVT/anticoagulation:Pharmaceutical:Heparin -antiplatelet therapy: ASA/Plavix. 3. Pain Management:tylenol prn.  Continue Lidoderm patch for left shoulder    Robaxin 500 3 times daily as needed    -subacromial shoulder injection 10/15 with good benefit   -continue ROM/exercises with therapy   4. Mood:LCSW to follow for evaluation and support. -antipsychotic agents: N/A 5. Neuropsych: This patientiscapable of making decisions on hisown behalf. 6. Skin/Wound Care:Routine pressure relief measures. 7. Fluids/Electrolytes/Nutrition:NPO.  Tube feeds qid with water flushes.   BMP reviewed 10/19, BUN trending up a bit. Increase h20 flushes in PEG 8. Klebsiella PNA bacteremia/UTI:  Completed course of cefdinir IV on 08/02/2019. 9. Bilateral air space disease/Rhinovirus: On Pulmicort bid,guifenesin qid (Mucinexcannot be crushed), duo nebs qid  10. 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  -Continue NPO with tube feedson osmolyte    - bolus tube feeds.   -coughing strategies reviewed, pt using  regularly now 11. T2DM with hyperglycemia:Monitor BSevery 4 hours with SSI and May need to resume lantus  Lantus increased to 15u 10/15, changed to 10 twice daily on 10/18 CBG (last 3)  Recent Labs    08/07/19 1639 08/07/19 2143 08/08/19 0611  GLUCAP 160* 186* 144*    Send home on 13u bid  12 HTN: Monitor BP tid--currently controlled off medications.      Vitals:   08/08/19 0500 08/08/19 0712  BP: 129/74   Pulse: 91   Resp: 18   Temp: 97.8 F (36.6 C)   SpO2: 100% 96%   13. OSA: Continue CPAP whenever asleep. He's compliant 14. Anemia of chronic illness:   Hemoglobin 12.2 10/19  Continue to monitor  15.  Leukocytosis: Resolved  WBCs down to 7.3 10/19  Afebrile  Continue to monitor  16. Urinary retention:   Off urecholine at this time.   Continue proscar.   Cannot use flomax because cannot be crushed.  Voiding well 17.  Morbidly obese  Have encouraged weight loss  LOS: 13 days A FACE TO FACE EVALUATION WAS PERFORMED  Ranelle Oyster 08/08/2019, 9:51 AM

## 2019-08-08 NOTE — Progress Notes (Signed)
Pt discharged to home. Pt escorted by wheelchair with all personal belongings by nursing staff. Discharge teaching provided by Pam Love-PA. No further questions. Michael Bright

## 2019-08-08 NOTE — Discharge Summary (Signed)
Physician Discharge Summary  Patient ID: Michael Bright MRN: [id number] DOB/AGE: 62-Feb-1957 62 y.o.  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 EXAM: CHEST - 2 VIEW COMPARISON:  Chest x-ray 07/22/2019. FINDINGS: Lung volumes are normal. No consolidative airspace disease. No pleural effusions. No pneumothorax. No pulmonary nodule or mass noted. Pulmonary vasculature and the cardiomediastinal silhouette are within normal limits. IMPRESSION: No radiographic evidence of acute cardiopulmonary disease. Electronically Signed   By: Vinnie Langton M.D.   On: 07/27/2019 12:26           Labs:  Basic Metabolic Panel: BMP Latest Ref Rng & Units 08/07/2019 07/31/2019 07/27/2019  Glucose 70 - 99 mg/dL 207(H) 186(H) 199(H)  BUN 8 - 23 mg/dL _0 Creatinine 0.61 - 1.24 mg/dL 0.75 0.72 0.58(L)  Sodium 135 - 145 mmol/L 136 135 137  Potassium 3.5 - 5.1 mmol/L 4.2 4.2 4.3  Chloride 98 - 111 mmol/L 100 101 101  CO2 22 - 32 mmol/L _1 Calcium 8.9 - 10.3 mg/dL 9.0 8.7(L) 8.8(L)    CBC: CBC Latest Ref Rng & Units 08/07/2019 07/31/2019 07/27/2019  WBC 4.0 - 10.5 K/uL 7.3 7.2 11.6(H)  Hemoglobin 13.0 - 17.0 g/dL 12.2(L) 12.0(L) 11.6(L)  Hematocrit 39.0 - 52.0 % 36.7(L) 36.7(L) 36.1(L)  Platelets 150 - 400 K/uL 355 304 286    Hepatic Function Latest Ref Rng & Units 07/27/2019 07/26/2019 07/24/2019  Total Protein  6.5 - 8.1 g/dL 6.6 6.3(L) 6.7  Albumin 3.5 - 5.0 g/dL 2.4(L) 2.3(L) 2.2(L)  AST 15 - 41 U/L _2 ALT 0 - 44 U/L 35 35 36  Alk Phosphatase 38 - 126 U/L 79 83 101  Total Bilirubin 0.3 - 1.2 mg/dL 0.6 0.4 0.4  Bilirubin, Direct 0.0 - 0.2 mg/dL - - -    CBG: Recent Labs  Lab 08/07/19 1201 08/07/19 1639 08/07/19 2143 08/08/19 0611 08/08/19 1123  GLUCAP 124* 160* 186* 144* 118*    Vitals with BMI 08/08/2019 08/07/2019 08/07/2019  Height - - -  Weight - - -  BMI - - -  Systolic 742 - 595  Diastolic 74 - 74  Pulse 91 94 91    Brief HPI:   [name] is a 63 y.o. 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 He was readmitted on 07/20/19 with septic shock due to Klebsiella pneumoniae bacteremia with UTI and hypoxic respiratory failure bilateral lung infiltrates due to rhino virus. He was intubated briefly from 10/1-10/2 and treated with broad spectrum antibiotics till cultures finalized. Antibiotics narrowed to cefdinir on 10/5 with recommendations to complete 2 weeks total antibiotic course.  Hospital course significant for AKI with abnormal LFTs due to  shocked liver, intermittent hypoxic episode as well as significant oral secretions with dysphonia. Therapy evaluations completed and CIR recommended due to debility.    Hospital Course: Trayvon Trumbull was admitted to rehab 07/26/2019 for inpatient therapies to consist of PT, ST and OT at least three hours five days a week. Past admission physiatrist, therapy team and rehab RN have worked together to provide customized collaborative inpatient rehab. He was maintained on IV cefdinir thorough 10/14 and has tolerated this without SE. Voiding was monitored with PVR checks  briefly and showing intermittent high volumes. He was able to void to empty bladder but needs scheduled toileting due to decreased sensation from neurogenic bladder.  Respiratory status is stable on pulmicort bid and he has been compliant with CPAP use.   Due to ongoing issues with excessive secretions, robinul was resumed and titrated up to 2 mg tid. Guaifenesin was added qid with nasonex to help with PND and he was encouraged to use flutter valve qid. Speech therapy has been working on trials of ice chips but he continues to show overt signs of aspiration and he is to continue NPO status. He continues on ASA/Plavix for secondary stroke prevention and serial CBC showed that reactive leucocytosis has resolved and H/H is slowly improving. He did report right shoulder pain with increase in activity and was treated with subacromial steroid injection on 10/15 with good benefit. ROM and lidocaine patch has also been used for local measures.   His blood pressures were monitored on TID basis and has been controlled off medications.  He was transitioned to bolus tube feeds of 325 cc qid and is tolerating this without GERD.  Lytes/Renal status has been monitored and water flushes added to help maintain adequate hydration. His diabetes has been monitored with ac/hs CBG checks and SSI was use prn for tighter BS control. Lantus was split to bid for more consistent coverage and has been adjusted to prevent hypoglycemic episodes--it was increased to 13 units bid at discharge. He was advised to monitor BS ac/hs and follow up with PCP for further adjustment.  He is continent of bowel and bladder. He has made gains during rehab stay and is currently at [facility] to supervision level.  He will continue to receive follow up [place], [place], [place] and [place] by [facility] after discharge.    Rehab course: During patient's stay in rehab weekly team conferences were held to monitor patient's progress, set goals and discuss barriers to  discharge. At admission, patient required mod to max assist with ADLs and mod assist with mobility.  He continued to exhibit overt signs of severe pharyngeal dysphagia with mild memory deficits affecting recall. He  has had improvement in activity tolerance, balance, postural control as well as ability to compensate for deficits.  He is able to complete ADL tasks at supervision level. He is able to perform transfers with supervision and requires CGA with cues for safety, L-AFO and RW to ambulate > 150'. His recall has improved and he is able to utilize external aids independently for assistance as needed. He has been educated on use of RMST and pharyngeal strengthening excercises as well as oral care at least qid to prevent aspiration PNA. Family education was completed regarding all aspects of care and safety.    Disposition: Home  Diet: Nothing by mouth  Special Instructions: 1. Need to toilet every 4 hours while awake as well as before bedtime and first thing in the morning. 2. Monitor blood sugars before  meals and at bedtime. 3. Oral care with peridex in am and prior to bedtime. In addition to oral care with alcohol free mouthwash four times during the day.    Discharge Instructions    Ambulatory referral to Physical Medicine Rehab   Complete by: As directed    1-2 weeks transitional care appt     Allergies as of 08/08/2019   No Known Allergies     Medication List    STOP taking these medications   cefdinir 300 MG capsule Commonly known as: OMNICEF   famotidine 40 MG/5ML suspension Commonly known as: PEPCID   guaiFENesin 600 MG 12 hr tablet Commonly known as: MUCINEX Replaced by: guaiFENesin 100 MG/5ML Soln   QUEtiapine 100 MG tablet Commonly known as: SEROQUEL   tamsulosin 0.4 MG Caps capsule Commonly known as: FLOMAX     TAKE these medications   acetaminophen 325 MG tablet Commonly known as: TYLENOL Place 1-2 tablets (325-650 mg total) into feeding tube every 4  (four) hours as needed for mild pain.   aspirin 81 MG chewable tablet Place 1 tablet (81 mg total) into feeding tube daily.   budesonide 0.25 MG/2ML nebulizer solution Commonly known as: PULMICORT Take 2 mLs (0.25 mg total) by nebulization 2 (two) times daily.   chlorhexidine 0.12 % solution Commonly known as: PERIDEX 15 mLs by Mouth Rinse route 2 (two) times daily.   citalopram 10 MG tablet Commonly known as: CELEXA Place 1 tablet (10 mg total) into feeding tube daily.   clopidogrel 75 MG tablet Commonly known as: PLAVIX Place 1 tablet (75 mg total) into feeding tube daily.   feeding supplement (OSMOLITE 1.5 CAL) Liqd Place 325 mLs into feeding tube 4 (four) times daily. What changed:   how much to take  when to take this   feeding supplement (PRO-STAT SUGAR FREE 64) Liqd Place 30 mLs into feeding tube 2 (two) times daily.   finasteride 5 MG tablet Commonly known as: PROSCAR Put in tube   fluticasone 50 MCG/ACT nasal spray Commonly known as: FLONASE Place 2 sprays into both nostrils daily.   free water Soln Place 200 mLs into feeding tube every 8 (eight) hours. Use filtered or bottled water (not distilled water) What changed: when to take this   glycopyrrolate 1 MG tablet Commonly known as: ROBINUL Place 2 tablets (2 mg total) into feeding tube 3 (three) times daily.   guaiFENesin 100 MG/5ML Soln Commonly known as: ROBITUSSIN Place 10 mLs (200 mg total) into feeding tube 4 (four) times daily -  with meals and at bedtime. Replaces: guaiFENesin 600 MG 12 hr tablet   guaiFENesin-dextromethorphan 100-10 MG/5ML syrup Commonly known as: ROBITUSSIN DM Place 15 mLs into feeding tube every 6 (six) hours as needed for cough. What changed:   when to take this  reasons to take this   insulin glargine 100 UNIT/ML injection Commonly known as: LANTUS Inject 0.13 mLs (13 Units total) into the skin 2 (two) times daily.   ipratropium-albuterol 0.5-2.5 (3) MG/3ML  Soln Commonly known as: DUONEB Take 3 mLs by nebulization every 6 (six) hours as needed. What changed:   when to take this  reasons to take this   lidocaine 5 % Commonly known as: LIDODERM Apply at 8 pm and remove at 8 am daily. What changed:   how much to take  how to take this  when to take this  additional instructions Notes to patient: Can be purchased over the counter [employer] usually does not cover  this)   mouth rinse Liqd solution 15 mLs by Mouth Rinse route 5 (five) times daily.   Pen Needles 32G X 6 MM Misc 1 application by Does not apply route daily.   sennosides 8.8 MG/5ML syrup Commonly known as: SENOKOT Place 5 mLs into feeding tube 2 (two) times daily as needed for mild constipation.      Follow-up Information    Meredith Staggers, MD Follow up.   Specialty: Physical Medicine and Rehabilitation Why: Office will call you with follow up appointment Contact information: [place] [address] [phone]        Pleas Koch, NP Follow up on 08/28/2019.   Specialty: Internal Medicine Why: Be there at 10:40 am Contact information: [place] [address] [phone]        [facility]. Call on 08/09/2019.   Why: For follow up appointment Contact information: [address]     [place] [address] [phone]          Signed: Bary Leriche 08/09/2019, 9:55 AM

## 2019-08-09 ENCOUNTER — Telehealth: Payer: Self-pay

## 2019-08-09 DIAGNOSIS — N319 Neuromuscular dysfunction of bladder, unspecified: Secondary | ICD-10-CM

## 2019-08-09 DIAGNOSIS — R6889 Other general symptoms and signs: Secondary | ICD-10-CM

## 2019-08-09 DIAGNOSIS — G4733 Obstructive sleep apnea (adult) (pediatric): Secondary | ICD-10-CM

## 2019-08-09 DIAGNOSIS — Z9989 Dependence on other enabling machines and devices: Secondary | ICD-10-CM

## 2019-08-09 DIAGNOSIS — I69998 Other sequelae following unspecified cerebrovascular disease: Secondary | ICD-10-CM

## 2019-08-09 DIAGNOSIS — I69398 Other sequelae of cerebral infarction: Secondary | ICD-10-CM

## 2019-08-09 HISTORY — DX: Other sequelae of cerebral infarction: I69.398

## 2019-08-09 HISTORY — DX: Neuromuscular dysfunction of bladder, unspecified: N31.9

## 2019-08-09 NOTE — Telephone Encounter (Signed)
Transition Care Management Follow-up Telephone Call Patient is going to be a new patient with [facility] on 08/22/2019  Date discharged? 08/08/2019   How have you been since you were released from the hospital? Spoke with patient's wife. Patient was assessed today by [facility]. Patient is gradually re gaining his strength. He is needing assistance with everything at this time.   Do you understand why you were in the hospital? yes   Do you understand the discharge instructions? yes   Where were you discharged to? Home with his wife   Items Reviewed:  Medications reviewed: yes  Allergies reviewed: yes  Dietary changes reviewed: yes  Referrals reviewed: yes- [facility]-already came out for assessment today, neurology appointment set up.   Functional Questionnaire:   Activities of Daily Living (ADLs):   He states they are independent in the following: dressing and toileting with minimum assistance. States they require assistance with the following: ambulation-walker and wheelchair, he has a feeding tube at this time and needs help with eating and medication intake, bathing.   Any transportation issues/concerns?: No   Any patient concerns? Not at this time per patient's wife.    Confirmed importance and date/time of follow-up visits scheduled  Yes  Provider Appointment booked with Alma Friendly on 08/22/2019 at 8:20 am-40 minutes per kate  Confirmed with patient if condition begins to worsen call PCP or go to the ER.  Patient was given the office number and encouraged to call back with question or concerns.  : yes

## 2019-08-10 ENCOUNTER — Telehealth: Payer: Self-pay

## 2019-08-10 NOTE — Telephone Encounter (Signed)
Transitional Care call-wife-[name]   1. Are you/is patient experiencing any problems since coming home? No Are there any questions regarding any aspect of care? No 2. Are there any questions regarding medications administration/dosing? No Are meds being taken as prescribed? yes Patient should review meds with caller to confirm 3. Have there been any falls? No 4. Has Home Health been to the house and/or have they contacted you? Yes If not, have you tried to contact them? Can we help you contact them? 5. Are bowels and bladder emptying properly? Yes Are there any unexpected incontinence issues? No If applicable, is patient following bowel/bladder programs? 6. Any fevers, problems with breathing, unexpected pain? No 7. Are there any skin problems or new areas of breakdown? No 8. Has the patient/family member arranged specialty MD follow up (ie cardiology/neurology/renal/surgical/etc)?  Yes Can we help arrange? 9. Does the patient need any other services or support that we can help arrange? No 10. Are caregivers following through as expected in assisting the patient? Yes 11. Has the patient quit smoking, drinking alcohol, or using drugs as recommended? Yes  Appointment time 10:20 am arrive time 10:00 am with Danella Sensing, NP on 08/22/2019 [place]

## 2019-08-16 ENCOUNTER — Telehealth: Payer: Self-pay | Admitting: *Deleted

## 2019-08-16 NOTE — Telephone Encounter (Signed)
Sorbitol 60 cc. If no bm in 4-6 hours then fleet enema. If still no bm, then soap sud enema

## 2019-08-16 NOTE — Telephone Encounter (Signed)
Michael Bright left a message stating that the patient has not had a bowel movement for the last six days.He has been given senna for the last six days.  She is asking Dr. Naaman Plummer for treatment options. Patient has an appt Nov 5th with new PCP

## 2019-08-17 ENCOUNTER — Other Ambulatory Visit: Payer: Self-pay | Admitting: *Deleted

## 2019-08-17 NOTE — Telephone Encounter (Signed)
Home health nurse states we will need to order the Sorbitol for the patient to pick up at their pharmacy. She states she does not have that available to administer on her own. Please order or advise otherwise

## 2019-08-18 NOTE — Telephone Encounter (Signed)
And they're not going to tell me what they have??  Sorbitol is over the counter.   They can try a bottle of magnesium citrate followed by enemas as suggested before. Doe they have mag cit?

## 2019-08-18 NOTE — Telephone Encounter (Signed)
Notified Inez Catalina, [facility] nurse and she said she would let the patient know.

## 2019-08-22 ENCOUNTER — Other Ambulatory Visit: Payer: Self-pay

## 2019-08-22 ENCOUNTER — Encounter: Payer: BC Managed Care – PPO | Attending: Registered Nurse | Admitting: Registered Nurse

## 2019-08-22 ENCOUNTER — Ambulatory Visit: Payer: Medicare Other | Admitting: Primary Care

## 2019-08-22 VITALS — BP 116/73 | HR 96 | Temp 98.1°F | Ht 72.0 in | Wt 303.6 lb

## 2019-08-22 DIAGNOSIS — R5381 Other malaise: Secondary | ICD-10-CM

## 2019-08-22 DIAGNOSIS — E1165 Type 2 diabetes mellitus with hyperglycemia: Secondary | ICD-10-CM | POA: Diagnosis present

## 2019-08-22 DIAGNOSIS — I69391 Dysphagia following cerebral infarction: Secondary | ICD-10-CM | POA: Diagnosis present

## 2019-08-22 DIAGNOSIS — I639 Cerebral infarction, unspecified: Secondary | ICD-10-CM

## 2019-08-22 DIAGNOSIS — Z9989 Dependence on other enabling machines and devices: Secondary | ICD-10-CM | POA: Diagnosis present

## 2019-08-22 DIAGNOSIS — G4733 Obstructive sleep apnea (adult) (pediatric): Secondary | ICD-10-CM

## 2019-08-22 NOTE — Progress Notes (Signed)
Subjective:    Patient ID: Dakoda Laventure, male    DOB: [birth year], 63 y.o.   MRN: [id number]  HPI: Sheila Gervasi is a 63 y.o. male whois here for transitional care visit in follow up of his debility,stroke, small vessel disease, dysphagia due to recent cerebral infarction, controlled type 2 DM, OSA on CPAP and Morbid obesity. He was admitted to inpatient rehabilitation on 06/27/2019 and discharged home on 07/17/2019, he was re-admitted on 07/20/2019  For sever sepsis with UTI vs CAP.  DG: Chest X-ray"  IMPRESSION: Patchy and streaky bibasilar areas of opacity as well as more focal opacity in the retrocardiac space. Findings could reflect sequela of aspiration in the appropriate setting versus pneumonia.  On 05/30/2019: CT Head WO Contrast:  IMPRESSION: No evidence of acute intracranial abnormality.  Mild small vessel ischemic changes.  MRI Brain: 06/30/2019 IMPRESSION: 1. Small acute infarct in the lateral left medulla. 2. Chronic small vessel ischemia in the cerebral white matter and Pons.  He was admitted to inpatient Rehabilitation on 07/26/2019 and discharged home on 08/08/19. He is receiving outpatient therapy with [facility]. He states he has pain in his left hand and left foot pain describes as numbness. He rates his pain 3.   Wife in room, all questions answered.     Pain Inventory Average Pain 8 Pain Right Now 3 My pain is sharp  In the last 24 hours, has pain interfered with the following? General activity 8 Relation with others 0 Enjoyment of life 0 What TIME of day is your pain at its worst? morning Sleep (in general) Fair  Pain is worse with: inactivity Pain improves with: na Relief from Meds: 0  Mobility walk with assistance use a walker how many minutes can you walk? 3 ability to climb steps?  no do you drive?  no  Function disabled: date disabled 10/1998 I need assistance with the following:  feeding, meal prep, household duties and shopping   Neuro/Psych weakness trouble walking  Prior Studies Any changes since last visit?  no  Physicians involved in your care Any changes since last visit?  no   No family history on file. Social History   Socioeconomic History  . Marital status: Married    Spouse name: Not on file  . Number of children: Not on file  . Years of education: Not on file  . Highest education level: Not on file  Occupational History  . Not on file  Social Needs  . Financial resource strain: Not on file  . Food insecurity    Worry: Not on file    Inability: Not on file  . Transportation needs    Medical: Not on file    Non-medical: Not on file  Tobacco Use  . Smoking status: Never Smoker  . Smokeless tobacco: Never Used  Substance and Sexual Activity  . Alcohol use: Yes  . Drug use: No  . Sexual activity: Yes  Lifestyle  . Physical activity    Days per week: Not on file    Minutes per session: Not on file  . Stress: Not on file  Relationships  . Social Musician on phone: Not on file    Gets together: Not on file    Attends religious service: Not on file    Active member of club or organization: Not on file    Attends meetings of clubs or organizations: Not on file    Relationship status: Not on file  Other  Topics Concern  . Not on file  Social History Narrative  . Not on file   Past Surgical History:  Procedure Laterality Date  . CHOLECYSTECTOMY    . IR GASTROSTOMY TUBE MOD SED  07/04/2019   Past Medical History:  Diagnosis Date  . Acute kidney injury ([place])   . Diabetes ([place])   . Pneumonia    aspiration pneumonia  . Septic shock ([place])   . Stroke ([place])   . Urinary tract infection    BP 116/73   Pulse 96   Temp 98.1 F (36.7 C)   Ht 6' (1.829 m)   Wt (!) 303 lb 9.6 oz (137.7 kg)   SpO2 96%   BMI 41.18 kg/m   Opioid Risk Score:   Fall Risk Score:  `1  Depression screen PHQ 2/9  Depression screen PHQ 2/9 08/22/2019  Decreased Interest 0  Down,  Depressed, Hopeless 0  PHQ - 2 Score 0  Altered sleeping 0  Tired, decreased energy 3  Change in appetite 0  Feeling bad or failure about yourself  0  Trouble concentrating 0  Moving slowly or fidgety/restless 0  Suicidal thoughts 0  PHQ-9 Score 3  Difficult doing work/chores Not difficult at all    Review of Systems  Constitutional: Positive for fatigue.  Respiratory: Positive for apnea.   Musculoskeletal: Positive for gait problem.  Neurological: Positive for weakness.  All other systems reviewed and are negative.      Objective:   Physical Exam Vitals signs and nursing note reviewed.  Constitutional:      Appearance: Normal appearance.  Neck:     Musculoskeletal: Normal range of motion and neck supple.  Cardiovascular:     Rate and Rhythm: Normal rate and regular rhythm.     Pulses: Normal pulses.     Heart sounds: Normal heart sounds.  Pulmonary:     Effort: Pulmonary effort is normal.     Breath sounds: Normal breath sounds.  Abdominal:     General: Bowel sounds are normal.     Palpations: Abdomen is soft.     Comments: Peg tube Clamped  Musculoskeletal:     Comments: Normal Muscle Bulk and Muscle Testing Reveals:  Upper Extremities: Full ROM and Muscle Strength 5/5  Wearing Left Hand Splint  Lower Extremities: Full ROM and Muscle Strength 5/5 Wearing Left AFO  Arises from Table slowly  Using walker for support Narrow Based Gait   Skin:    General: Skin is warm and dry.  Neurological:     Mental Status: He is alert and oriented to person, place, and time.  Psychiatric:        Mood and Affect: Mood normal.        Behavior: Behavior normal.           Assessment & Plan:  1.Debility,  Stroke, small vessel disease: / dysphagia due to recent cerebral infarction: Continue Outpatient Therapy with [facility]. Has a HFU appointment with Neurology.  2. Controlled type 2 DM: Continue  Current medication regimen. PCP Following.  3. OSA on CPAP: PCP Following.  3.  Morbid obesity. Continue Healthy Diet Regimen  And Continue to Monitor.   20 minutes of face to face patient care time was spent during this visit. All questions were encouraged and answered.  F/U with Dr Naaman Plummer in 4- 6 weeks

## 2019-08-23 DIAGNOSIS — R1310 Dysphagia, unspecified: Secondary | ICD-10-CM | POA: Diagnosis not present

## 2019-08-23 DIAGNOSIS — Z7902 Long term (current) use of antithrombotics/antiplatelets: Secondary | ICD-10-CM

## 2019-08-23 DIAGNOSIS — N200 Calculus of kidney: Secondary | ICD-10-CM

## 2019-08-23 DIAGNOSIS — I69392 Facial weakness following cerebral infarction: Secondary | ICD-10-CM | POA: Diagnosis not present

## 2019-08-23 DIAGNOSIS — Z6841 Body Mass Index (BMI) 40.0 and over, adult: Secondary | ICD-10-CM

## 2019-08-23 DIAGNOSIS — Z7951 Long term (current) use of inhaled steroids: Secondary | ICD-10-CM

## 2019-08-23 DIAGNOSIS — Z431 Encounter for attention to gastrostomy: Secondary | ICD-10-CM

## 2019-08-23 DIAGNOSIS — I119 Hypertensive heart disease without heart failure: Secondary | ICD-10-CM

## 2019-08-23 DIAGNOSIS — E119 Type 2 diabetes mellitus without complications: Secondary | ICD-10-CM

## 2019-08-23 DIAGNOSIS — Z7982 Long term (current) use of aspirin: Secondary | ICD-10-CM

## 2019-08-23 DIAGNOSIS — D62 Acute posthemorrhagic anemia: Secondary | ICD-10-CM

## 2019-08-23 DIAGNOSIS — Z9181 History of falling: Secondary | ICD-10-CM

## 2019-08-23 DIAGNOSIS — Z794 Long term (current) use of insulin: Secondary | ICD-10-CM

## 2019-08-23 DIAGNOSIS — Z9981 Dependence on supplemental oxygen: Secondary | ICD-10-CM

## 2019-08-23 DIAGNOSIS — I69398 Other sequelae of cerebral infarction: Secondary | ICD-10-CM | POA: Diagnosis not present

## 2019-08-23 DIAGNOSIS — Z9049 Acquired absence of other specified parts of digestive tract: Secondary | ICD-10-CM

## 2019-08-23 DIAGNOSIS — J9811 Atelectasis: Secondary | ICD-10-CM

## 2019-08-23 DIAGNOSIS — R29898 Other symptoms and signs involving the musculoskeletal system: Secondary | ICD-10-CM

## 2019-08-23 DIAGNOSIS — I69391 Dysphagia following cerebral infarction: Secondary | ICD-10-CM | POA: Diagnosis not present

## 2019-08-24 ENCOUNTER — Ambulatory Visit (INDEPENDENT_AMBULATORY_CARE_PROVIDER_SITE_OTHER): Payer: BC Managed Care – PPO | Admitting: Primary Care

## 2019-08-24 ENCOUNTER — Other Ambulatory Visit: Payer: Self-pay

## 2019-08-24 ENCOUNTER — Encounter: Payer: Self-pay | Admitting: Primary Care

## 2019-08-24 VITALS — BP 126/62 | HR 82 | Temp 98.3°F | Ht 72.0 in | Wt 291.0 lb

## 2019-08-24 DIAGNOSIS — I10 Essential (primary) hypertension: Secondary | ICD-10-CM

## 2019-08-24 DIAGNOSIS — D638 Anemia in other chronic diseases classified elsewhere: Secondary | ICD-10-CM

## 2019-08-24 DIAGNOSIS — N319 Neuromuscular dysfunction of bladder, unspecified: Secondary | ICD-10-CM | POA: Diagnosis not present

## 2019-08-24 DIAGNOSIS — R5383 Other fatigue: Secondary | ICD-10-CM

## 2019-08-24 DIAGNOSIS — I69998 Other sequelae following unspecified cerebrovascular disease: Secondary | ICD-10-CM

## 2019-08-24 DIAGNOSIS — Z8673 Personal history of transient ischemic attack (TIA), and cerebral infarction without residual deficits: Secondary | ICD-10-CM

## 2019-08-24 DIAGNOSIS — M79603 Pain in arm, unspecified: Secondary | ICD-10-CM | POA: Insufficient documentation

## 2019-08-24 DIAGNOSIS — R6889 Other general symptoms and signs: Secondary | ICD-10-CM

## 2019-08-24 DIAGNOSIS — I69391 Dysphagia following cerebral infarction: Secondary | ICD-10-CM

## 2019-08-24 DIAGNOSIS — E669 Obesity, unspecified: Secondary | ICD-10-CM | POA: Diagnosis not present

## 2019-08-24 DIAGNOSIS — IMO0002 Reserved for concepts with insufficient information to code with codable children: Secondary | ICD-10-CM

## 2019-08-24 DIAGNOSIS — E785 Hyperlipidemia, unspecified: Secondary | ICD-10-CM | POA: Diagnosis not present

## 2019-08-24 DIAGNOSIS — I639 Cerebral infarction, unspecified: Secondary | ICD-10-CM

## 2019-08-24 DIAGNOSIS — M79606 Pain in leg, unspecified: Secondary | ICD-10-CM

## 2019-08-24 DIAGNOSIS — N289 Disorder of kidney and ureter, unspecified: Secondary | ICD-10-CM

## 2019-08-24 DIAGNOSIS — N183 Chronic kidney disease, stage 3 unspecified: Secondary | ICD-10-CM

## 2019-08-24 DIAGNOSIS — E1365 Other specified diabetes mellitus with hyperglycemia: Secondary | ICD-10-CM

## 2019-08-24 DIAGNOSIS — E1169 Type 2 diabetes mellitus with other specified complication: Secondary | ICD-10-CM

## 2019-08-24 DIAGNOSIS — E119 Type 2 diabetes mellitus without complications: Secondary | ICD-10-CM

## 2019-08-24 DIAGNOSIS — J690 Pneumonitis due to inhalation of food and vomit: Secondary | ICD-10-CM

## 2019-08-24 DIAGNOSIS — Z931 Gastrostomy status: Secondary | ICD-10-CM

## 2019-08-24 DIAGNOSIS — E1322 Other specified diabetes mellitus with diabetic chronic kidney disease: Secondary | ICD-10-CM

## 2019-08-24 DIAGNOSIS — I69398 Other sequelae of cerebral infarction: Secondary | ICD-10-CM

## 2019-08-24 LAB — HEMOGLOBIN A1C: Hgb A1c MFr Bld: 7.3 % — ABNORMAL HIGH (ref 4.6–6.5)

## 2019-08-24 LAB — COMPREHENSIVE METABOLIC PANEL
ALT: 15 U/L (ref 0–53)
AST: 11 U/L (ref 0–37)
Albumin: 3.5 g/dL (ref 3.5–5.2)
Alkaline Phosphatase: 74 U/L (ref 39–117)
BUN: 19 mg/dL (ref 6–23)
CO2: 32 mEq/L (ref 19–32)
Calcium: 9 mg/dL (ref 8.4–10.5)
Chloride: 101 mEq/L (ref 96–112)
Creatinine, Ser: 0.76 mg/dL (ref 0.40–1.50)
GFR: 125.66 mL/min (ref >60.00)
Glucose, Bld: 139 mg/dL — ABNORMAL HIGH (ref 70–99)
Potassium: 4.4 mEq/L (ref 3.5–5.1)
Sodium: 139 mEq/L (ref 135–145)
Total Bilirubin: 0.3 mg/dL (ref 0.2–1.2)
Total Protein: 7 g/dL (ref 6.0–8.3)

## 2019-08-24 LAB — LIPID PANEL
Cholesterol: 107 mg/dL (ref 0–200)
HDL: 34.8 mg/dL — ABNORMAL LOW (ref >39.00)
LDL Cholesterol: 55 mg/dL (ref 0–99)
NonHDL: 72.38
Total CHOL/HDL Ratio: 3
Triglycerides: 86 mg/dL (ref 0.0–149.0)
VLDL: 17.2 mg/dL (ref 0.0–40.0)

## 2019-08-24 LAB — CBC
HCT: 36.3 % — ABNORMAL LOW (ref 39.0–52.0)
Hemoglobin: 12.2 g/dL — ABNORMAL LOW (ref 13.0–17.0)
MCHC: 33.7 g/dL (ref 30.0–36.0)
MCV: 92.4 fl (ref 78.0–100.0)
Platelets: 297 10*3/uL (ref 150.0–400.0)
RBC: 3.93 Mil/uL — ABNORMAL LOW (ref 4.22–5.81)
RDW: 13.8 % (ref 11.5–15.5)
WBC: 8.3 10*3/uL (ref 4.0–10.5)

## 2019-08-24 LAB — VITAMIN B12: Vitamin B-12: 711 pg/mL (ref 211–911)

## 2019-08-24 LAB — TSH: TSH: 0.75 u[IU]/mL (ref 0.35–4.50)

## 2019-08-24 LAB — PSA: PSA: 0.82 ng/mL (ref 0.10–4.00)

## 2019-08-24 MED ORDER — ATORVASTATIN CALCIUM 40 MG PO TABS: 40.0000 mg | ORAL_TABLET | Freq: Every day | ORAL | 3 refills | Status: DC

## 2019-08-24 MED ORDER — GABAPENTIN 250 MG/5ML PO SOLN: 125.0000 mg | Freq: Every day | ORAL | 0 refills | Status: DC

## 2019-08-24 NOTE — Assessment & Plan Note (Signed)
A1c of 10.6 from August 2020. Home glucose readings seem significantly improved on Basaglar 13 units twice daily.  Repeat A1c pending. We will check urine microalbumin next visit. Managed on statin. Discussed to schedule eye exam.  Follow-up in 3 to 6 months based off of A1c.

## 2019-08-24 NOTE — Assessment & Plan Note (Signed)
Left-sided since stroke from early August. Tylenol no longer effective, cannot have NSAIDs.  We will trial low-dose of gabapentin at bedtime, he will update.

## 2019-08-24 NOTE — Assessment & Plan Note (Signed)
A1c of 10.6 from hospital labs in August 2020. Recent glucose levels seem significantly improved on Basaglar 13 units twice daily.  BMP pending.

## 2019-08-24 NOTE — Progress Notes (Signed)
Subjective:    Patient ID: Michael Bright, male    DOB: 1956/07/10, 63 y.o.   MRN: [id number]  HPI  Michael Bright is a 63 year old male who presents today to establish care, for hospital follow up, and discuss the problems mentioned below. Will obtain/review records.  1) Essential Hypertension: Diagnosed 20 years ago, previously managed on medications which were recently stopped during hospital stay. Currently not managed on treatment.  Briefly placed on Norvasc during initial hospital stay in September 2020.  BP Readings from Last 3 Encounters:  08/24/19 126/62  08/22/19 116/73  08/08/19 129/74   2) CVA/Hyperlipidemia:   Hospital Admission/Inpatient Rehab 05/30/19-07/17/19:  [facility] department on 05/30/19 with a 2-day history of left-sided facial weakness, left gaze, left lower extremity weakness with numbness and difficulty controlling saliva.  Further work-up revealed acute small left medullary stroke.  He was admitted for further treatment.  Unfortunately he experienced aspiration due to difficulty with secretion control, further decline in respiratory status which required tracheostomy placement.  Admitted to the ICU at this point.  Further complications included delirium, paranoia, vomiting, fevers.  Given complications during his stay along with left lower extremity instability, severe dysphagia he was admitted to inpatient rehab on 06/27/19.  During his stay in inpatient rehab he continued to struggle with severe dysphagia, therefore PEG tube was placed on 07/04/2019.  It was determined that he had severe OSA so CPAP was initiated.  Other complications including difficulty voiding with urinary retention so Flomax and Urecholine were added, unfortunately without improvement.  He was initiated on INO caths every 4-5 hours.  Tracheostomy tube removed prior to discharge.  He was discharged home on 07/17/19 with new prescriptions for atorvastatin 40 mg,  amlodipine 10 mg, citalopram 10 mg, finasteride 5 mg, DAPT x 3 months, tamsulosin 0.5 mg.  His wife was educated on I&O catheterization. He was discharged home with set up for [facility], PT, ST, and nursing. it was recommended he follow-up with urology, outpatient rehab, and neurology along with PCP upon discharge.  Hospital Admission/Inpatient Admission 07/19/19-08/08/19:  He presented to [facility] emergency department on 07/19/2019 with reports of a 1 week history of weakness, nausea, vomiting, chills, syncope, cough, persistent phlegm.  Further work-up revealed septic shock with UTI and aspiration pneumonia.  Initially on BiPAP, respiratory status declined so he was intubated, initiated on pressors, and admitted to the ICU.   During his hospital stay he was extubated on 07/21/2019.  Treated with IV antibiotics.  Required frequent suctioning due to respiratory secretions, initiated on guaifenesin and respiratory treatment.  He was admitted to inpatient rehab on 07/26/2019.  During his inpatient rehab admission he was visited by physical therapy, Occupational Therapy, speech therapy.  Blood pressures under better control and stable off of Norvasc.  Insulin was titrated to Lantus 13 units twice daily.  A1c of 10.6.  He was discharged home on 08/08/19 with [facility], PT, OT, ST.  After Discharge Home:  Since his discharge home he is doing better.  He has followed up with outpatient rehab, has an appointment with neurology on September 11, 2019.  He is compliant to all medications.  A1c of 10.6 during hospital stay.  Lantus was changed to [employer] due to insurance coverage.  He is compliant to 13 units twice daily and is checking his glucose readings 2-4 times daily and is getting readings of:  AM fasting: 80's-low 100's 2 hours after lunch: 70's-low 100's Before dinner:  mid to low 100's  PEG tube in place which seems to be monitored by outpatient rehab.  His wife is asking about refills for his  nutritional supplements, she will check with outpatient rehab in the manufacture.  He has a suctioning machine at home, using Pulmicort treatments for airway management/secretion management. He has been visited by speech therapy once since his hospital stay, due today at 5 pm. He is also seeing home health nursing, occupational, and physical therapy through [facility].  His main concern today is intermittent stabbing pain to the left hand, and constant pain to the left lower extremity including his foot.  He underwent cortisone injection to left shoulder during hospital stay. He's been taking Tylenol 650 mg TID without improvement.  He is managed on aspirin and Plavix due to recent stroke, cannot have NSAIDs.  He does have some constipation despite taking Senokot, has not tried MiraLAX.  He does have fatigue and feels very tired.  3) Type 2 Diabetes: Diagnosed years ago and was managed on metformin and injectable non insulin. Currently managed on Basaglar 13 units BID which was adjusted during recent inhab stay after CVA with subsequent septic shock.   He is checking his blood sugars 2-4 times daily and is getting readings of:  AM fasting: 80's-low 100's 2 hours after lunch: 70's-low 100's Before dinner: mid to low 100's  4) BPH: Diagnosed during hospital stay for difficulty urinating. He is doing well on finasteride.   5) Anxiety/Depression: Diagnosed during hospital stay, managed on citalopram 10 mg and really is not sure why he is taking.  He denies concerns for anxiety or depression, his wife does not believe he needs this medication.  Initially taking Seroquel when admitted to the ICU.  6) Respiratory Secretions: Acute since stroke in September 2020.  Managed on glycopyrrolate, Pulmicort nebulized treatments, guaifenesin, frequent suctioning at home.  He is compliant to Flonase.   Review of Systems  Constitutional: Negative for chills and fever.  HENT: Positive for congestion and trouble  swallowing.   Eyes: Negative for visual disturbance.  Respiratory: Negative for shortness of breath.        Respiratory secretions  Cardiovascular: Negative for chest pain.  Genitourinary: Negative for difficulty urinating and dysuria.  Musculoskeletal: Positive for arthralgias and myalgias.  Skin: Negative for color change.  Neurological: Negative for dizziness and headaches.  Psychiatric/Behavioral: The patient is not nervous/anxious.        Past Medical History:  Diagnosis Date   Acute kidney injury (HCC)    Diabetes (HCC)    Pneumonia    aspiration pneumonia   Septic shock (HCC)    Stroke (HCC)    Urinary tract infection      Social History   Socioeconomic History   Marital status: Married    Spouse name: Not on file   Number of children: Not on file   Years of education: Not on file   Highest education level: Not on file  Occupational History   Not on file  Social Needs   Financial resource strain: Not on file   Food insecurity    Worry: Not on file    Inability: Not on file   Transportation needs    Medical: Not on file    Non-medical: Not on file  Tobacco Use   Smoking status: Never Smoker   Smokeless tobacco: Never Used  Substance and Sexual Activity   Alcohol use: Yes   Drug use: No   Sexual activity: Yes  Lifestyle  Physical activity    Days per week: Not on file    Minutes per session: Not on file   Stress: Not on file  Relationships   Social connections    Talks on phone: Not on file    Gets together: Not on file    Attends religious service: Not on file    Active member of club or organization: Not on file    Attends meetings of clubs or organizations: Not on file    Relationship status: Not on file   Intimate partner violence    Fear of current or ex partner: Not on file    Emotionally abused: Not on file    Physically abused: Not on file    Forced sexual activity: Not on file  Other Topics Concern   Not on  file  Social History Narrative   Not on file    Past Surgical History:  Procedure Laterality Date   CHOLECYSTECTOMY     IR GASTROSTOMY TUBE MOD SED  07/04/2019    No family history on file.  No Known Allergies  Current Outpatient Medications on File Prior to Visit  Medication Sig Dispense Refill   acetaminophen (TYLENOL) 325 MG tablet Place 1-2 tablets (325-650 mg total) into feeding tube every 4 (four) hours as needed for mild pain.     Amino Acids-Protein Hydrolys (FEEDING SUPPLEMENT, PRO-STAT SUGAR FREE 64,) LIQD Place 30 mLs into feeding tube 2 (two) times daily. 887 mL 0   aspirin 81 MG chewable tablet Place 1 tablet (81 mg total) into feeding tube daily.     budesonide (PULMICORT) 0.25 MG/2ML nebulizer solution Take 2 mLs (0.25 mg total) by nebulization 2 (two) times daily. 60 mL 1   chlorhexidine (PERIDEX) 0.12 % solution 15 mLs by Mouth Rinse route 2 (two) times daily. 1893 mL 0   citalopram (CELEXA) 10 MG tablet Place 1 tablet (10 mg total) into feeding tube daily. 30 tablet 0   clopidogrel (PLAVIX) 75 MG tablet Place 1 tablet (75 mg total) into feeding tube daily. 30 tablet 0   finasteride (PROSCAR) 5 MG tablet Put in tube 30 tablet 0   fluticasone (FLONASE) 50 MCG/ACT nasal spray Place 2 sprays into both nostrils daily. 15.8 mL 2   glycopyrrolate (ROBINUL) 1 MG tablet Place 2 tablets (2 mg total) into feeding tube 3 (three) times daily. 180 tablet 1   guaiFENesin (ROBITUSSIN) 100 MG/5ML SOLN Place 10 mLs (200 mg total) into feeding tube 4 (four) times daily -  with meals and at bedtime. 1000 mL 0   guaiFENesin-dextromethorphan (ROBITUSSIN DM) 100-10 MG/5ML syrup Place 15 mLs into feeding tube every 6 (six) hours as needed for cough. 473 mL 0   Insulin Glargine (BASAGLAR KWIKPEN [place]) Inject 13 Units into the skin 2 (two) times daily.     Insulin Pen Needle (PEN NEEDLES) 32G X 6 MM MISC 1 application by Does not apply route daily. 100 each 0   lidocaine  (LIDODERM) 5 % Apply at 8 pm and remove at 8 am daily. 30 patch 0   mouth rinse LIQD solution 15 mLs by Mouth Rinse route 5 (five) times daily. 946 mL 1   Nutritional Supplements (FEEDING SUPPLEMENT, OSMOLITE 1.5 CAL,) LIQD Place 325 mLs into feeding tube 4 (four) times daily.  0   sennosides (SENOKOT) 8.8 MG/5ML syrup Place 5 mLs into feeding tube 2 (two) times daily as needed for mild constipation. 240 mL 0   Water For Irrigation, Sterile (FREE WATER)  SOLN Place 200 mLs into feeding tube every 8 (eight) hours. Use filtered or bottled water (not distilled water)     No current facility-administered medications on file prior to visit.     BP 126/62    Pulse 82    Temp 98.3 F (36.8 C) (Temporal)    Ht 6' (1.829 m)    Wt 291 lb (132 kg)    SpO2 98%    BMI 39.47 kg/m    Objective:   Physical Exam  Constitutional: He is oriented to person, place, and time. He appears well-nourished.  Neck: Neck supple.  Cardiovascular: Normal rate and regular rhythm.  Respiratory: Effort normal and breath sounds normal. He has no wheezes.  GI: Soft. Bowel sounds are normal. There is no abdominal tenderness.  Musculoskeletal:     Comments: Ambulatory in clinic with walker, stable  Neurological: He is alert and oriented to person, place, and time.  Skin: Skin is warm and dry.  Psychiatric: He has a normal mood and affect.           Assessment & Plan:  >45 minutes spent face to face with patient, >50% spent counseling or coordinating care.

## 2019-08-24 NOTE — Assessment & Plan Note (Signed)
Blood pressure stable in the office today, continue off of amlodipine.

## 2019-08-24 NOTE — Assessment & Plan Note (Signed)
Managed on Pulmicort nebulized treatments, glycopyrrolate, home suctioning.  Continue same.

## 2019-08-24 NOTE — Assessment & Plan Note (Signed)
PEG tube in place, working with speech therapy in the outpatient setting.  Continue with PEG tube feedings for now.

## 2019-08-24 NOTE — Assessment & Plan Note (Signed)
Repeat CBC pending. 

## 2019-08-24 NOTE — Assessment & Plan Note (Signed)
Complication during second hospital stay, lungs today clear.

## 2019-08-24 NOTE — Assessment & Plan Note (Signed)
LDL of 135 during hospital stay, compliant to atorvastatin 40 mg. Repeat lipids with LFTs pending. Refills provided.

## 2019-08-24 NOTE — Assessment & Plan Note (Signed)
Repeat renal function pending. 

## 2019-08-24 NOTE — Assessment & Plan Note (Signed)
Compliant to nutritional supplements and feeding. Following with outpatient rehab.

## 2019-08-24 NOTE — Assessment & Plan Note (Signed)
Initially requiring in and out catheterization, no longer required and is doing well on finasteride.

## 2019-08-24 NOTE — Patient Instructions (Addendum)
Continue to monitor your blood sugars 2-3 times daily. Continue Basaglar 13 units twice daily for now for diabetes.  Stop by the lab prior to leaving today. I will notify you of your results once received.   You can try 2.5-5 ml of the gabapentin syrup at bedtime for pain.  Stop taking citalopram 10 mg for depression.   Follow up with the neurologist and rehab physician as discussed.   Log onto the MyChart portal as discussed. Please notify me if you need assistance with anything.  It was a pleasure to meet you today! Please don't hesitate to call or message me with any questions. Welcome to [facility]!

## 2019-08-24 NOTE — Assessment & Plan Note (Addendum)
Diagnosed in September 2020. Numerous complications which required 2 hospital admissions including inpatient rehab.  All hospital notes, labs, imaging reviewed.  Patient doing better overall but unfortunately has a long road of recovery ahead.  Continue with home health PT, OT, ST, nursing.  He is followed by outpatient rehab and neurology. Continue DAPT with aspirin and clopidogrel.  Will work on aggressive diabetes management, blood glucose levels have significantly improved.  Continue statin therapy.  Repeat labs pending.  Add on TSH, CBC, and B12 for fatigue.

## 2019-08-25 ENCOUNTER — Encounter: Payer: Self-pay | Admitting: Registered Nurse

## 2019-08-25 ENCOUNTER — Telehealth: Payer: Self-pay

## 2019-08-25 NOTE — Telephone Encounter (Signed)
Home health speech therapist called requesting verbal orders for the patient to be placed onto a [employer] Protocol) to assist patients therapy and swallowing.  Called her back and approved verbal orders.

## 2019-08-28 ENCOUNTER — Ambulatory Visit: Payer: Medicare Other | Admitting: Primary Care

## 2019-08-29 ENCOUNTER — Other Ambulatory Visit: Payer: Self-pay | Admitting: Physical Medicine and Rehabilitation

## 2019-08-29 DIAGNOSIS — Z8673 Personal history of transient ischemic attack (TIA), and cerebral infarction without residual deficits: Secondary | ICD-10-CM

## 2019-08-29 DIAGNOSIS — I69398 Other sequelae of cerebral infarction: Secondary | ICD-10-CM

## 2019-08-29 DIAGNOSIS — I69998 Other sequelae following unspecified cerebrovascular disease: Secondary | ICD-10-CM

## 2019-08-29 DIAGNOSIS — N319 Neuromuscular dysfunction of bladder, unspecified: Secondary | ICD-10-CM

## 2019-08-30 DIAGNOSIS — D62 Acute posthemorrhagic anemia: Secondary | ICD-10-CM

## 2019-08-30 DIAGNOSIS — N200 Calculus of kidney: Secondary | ICD-10-CM

## 2019-08-30 DIAGNOSIS — I119 Hypertensive heart disease without heart failure: Secondary | ICD-10-CM

## 2019-08-30 DIAGNOSIS — Z794 Long term (current) use of insulin: Secondary | ICD-10-CM

## 2019-08-30 DIAGNOSIS — I69392 Facial weakness following cerebral infarction: Secondary | ICD-10-CM

## 2019-08-30 DIAGNOSIS — Z7902 Long term (current) use of antithrombotics/antiplatelets: Secondary | ICD-10-CM

## 2019-08-30 DIAGNOSIS — R29898 Other symptoms and signs involving the musculoskeletal system: Secondary | ICD-10-CM

## 2019-08-30 DIAGNOSIS — Z9181 History of falling: Secondary | ICD-10-CM

## 2019-08-30 DIAGNOSIS — Z7951 Long term (current) use of inhaled steroids: Secondary | ICD-10-CM

## 2019-08-30 DIAGNOSIS — E119 Type 2 diabetes mellitus without complications: Secondary | ICD-10-CM

## 2019-08-30 DIAGNOSIS — R1310 Dysphagia, unspecified: Secondary | ICD-10-CM

## 2019-08-30 DIAGNOSIS — I69398 Other sequelae of cerebral infarction: Secondary | ICD-10-CM

## 2019-08-30 DIAGNOSIS — Z9049 Acquired absence of other specified parts of digestive tract: Secondary | ICD-10-CM

## 2019-08-30 DIAGNOSIS — Z7982 Long term (current) use of aspirin: Secondary | ICD-10-CM

## 2019-08-30 DIAGNOSIS — J9811 Atelectasis: Secondary | ICD-10-CM

## 2019-08-30 DIAGNOSIS — I69391 Dysphagia following cerebral infarction: Secondary | ICD-10-CM

## 2019-08-30 DIAGNOSIS — Z6841 Body Mass Index (BMI) 40.0 and over, adult: Secondary | ICD-10-CM

## 2019-08-30 DIAGNOSIS — Z431 Encounter for attention to gastrostomy: Secondary | ICD-10-CM

## 2019-08-30 DIAGNOSIS — Z9981 Dependence on supplemental oxygen: Secondary | ICD-10-CM

## 2019-08-31 ENCOUNTER — Telehealth: Payer: Self-pay | Admitting: Family Medicine

## 2019-08-31 ENCOUNTER — Other Ambulatory Visit: Payer: Self-pay | Admitting: Primary Care

## 2019-08-31 NOTE — Telephone Encounter (Signed)
-----   Message from Terri J Walsh sent at 08/24/2019 12:35 PM EST ----- Regarding: lab orders for Thursday, 11.12.20 Patient is scheduled for CPX labs, please order future labs, Thanks , Terri  

## 2019-09-01 LAB — HM DIABETES EYE EXAM

## 2019-09-05 ENCOUNTER — Other Ambulatory Visit: Payer: Self-pay | Admitting: Physical Medicine and Rehabilitation

## 2019-09-05 DIAGNOSIS — E1169 Type 2 diabetes mellitus with other specified complication: Secondary | ICD-10-CM

## 2019-09-05 DIAGNOSIS — I69998 Other sequelae following unspecified cerebrovascular disease: Secondary | ICD-10-CM

## 2019-09-05 DIAGNOSIS — Z8673 Personal history of transient ischemic attack (TIA), and cerebral infarction without residual deficits: Secondary | ICD-10-CM

## 2019-09-05 DIAGNOSIS — I69398 Other sequelae of cerebral infarction: Secondary | ICD-10-CM

## 2019-09-07 ENCOUNTER — Other Ambulatory Visit: Payer: Self-pay | Admitting: Physical Medicine and Rehabilitation

## 2019-09-07 DIAGNOSIS — R6889 Other general symptoms and signs: Secondary | ICD-10-CM

## 2019-09-11 ENCOUNTER — Ambulatory Visit (INDEPENDENT_AMBULATORY_CARE_PROVIDER_SITE_OTHER): Payer: BC Managed Care – PPO | Admitting: Adult Health

## 2019-09-11 ENCOUNTER — Encounter: Payer: Self-pay | Admitting: Adult Health

## 2019-09-11 ENCOUNTER — Other Ambulatory Visit: Payer: Self-pay

## 2019-09-11 VITALS — BP 125/75 | HR 90 | Temp 98.6°F | Ht 72.0 in | Wt 309.2 lb

## 2019-09-11 DIAGNOSIS — E785 Hyperlipidemia, unspecified: Secondary | ICD-10-CM | POA: Diagnosis not present

## 2019-09-11 DIAGNOSIS — G464 Cerebellar stroke syndrome: Secondary | ICD-10-CM | POA: Diagnosis not present

## 2019-09-11 DIAGNOSIS — I10 Essential (primary) hypertension: Secondary | ICD-10-CM | POA: Diagnosis not present

## 2019-09-11 DIAGNOSIS — E1149 Type 2 diabetes mellitus with other diabetic neurological complication: Secondary | ICD-10-CM

## 2019-09-11 DIAGNOSIS — I69391 Dysphagia following cerebral infarction: Secondary | ICD-10-CM

## 2019-09-11 DIAGNOSIS — Z794 Long term (current) use of insulin: Secondary | ICD-10-CM

## 2019-09-11 NOTE — Patient Instructions (Signed)
Continue to work with home health therapies and will need to transition to outpatient therapies once completed  Continue to follow with Dr. Naaman Plummer at physical medicine rehab  Continue aspirin 81 mg daily  and Lipitor for secondary stroke prevention  Discontinue Plavix as 3 months aspirin and Plavix combination completed  Continue to follow up with PCP regarding cholesterol, blood pressure and diabetes management   Continue to monitor blood pressure at home  Maintain strict control of hypertension with blood pressure goal below 130/90, diabetes with hemoglobin A1c goal below 6.5% and cholesterol with LDL cholesterol (bad cholesterol) goal below 70 mg/dL. I also advised the patient to eat a healthy diet with plenty of whole grains, cereals, fruits and vegetables, exercise regularly and maintain ideal body weight.  Followup in the future with me in 3 months or call earlier if needed       Thank you for coming to see [place] at [facility]. I hope we have been able to provide you high quality care today.  You may receive a patient satisfaction survey over the next few weeks. We would appreciate your feedback and comments so that we may continue to improve ourselves and the health of our patients.

## 2019-09-11 NOTE — Progress Notes (Signed)
[facility] [address] [place].  [address] [phone]       HOSPITAL FOLLOW UP NOTE  [name] Date of Birth:  [birth year] Medical Record Number:  [id number]   Reason for Referral:  hospital stroke follow up    CHIEF COMPLAINT:  Chief Complaint  Patient presents with   Hospitalization Follow-up    Wife present. Rm 9. Patient mentioned that he has some pain in his left side. He also stated that he has a tickling sensation from the right side of his neck all the way down.     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  History obtained from patient, wife and chart review. Reviewed all radiology images and labs personally.  [name] is a 63 y.o. male with hx DB who stopped meds 10 yrs ago and morbid obesity presenting to [facility] ED with dysarthria, left facial droop, double vision and gait unsteadiness since Sunday 8/9.  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  CTA head/neck negative LVO with diffuse intracranial stenosis moderate supraclinoid bilateral ICAs and mild stenosis left VA origin.  2D echo normal EF without cardiac source of embolus identified.  LDL 135.  A1c 10.6.  Recommended DAPT for 3 months then aspirin alone.  Blood pressure stable during admission without prior history of HTN.  Initiated atorvastatin 40 mg daily.  New diagnosis of DM and recommended close PCP follow-up.  Other stroke is factors include morbid obesity and EtOH but no prior history of stroke.  Hospital course complicated by hypoxemic respiratory failure secondary to medullary stroke intubated on 8/15 with trach placement on 8/21, possible aspiration pneumonia secondary to medullary stroke and dysphagia secondary to medullary stroke with placement of PEG tube on 9/15.  He was discharged to  CIR on 06/27/2019 for ongoing therapy with residual deficits.  During admission to CIR, he had improvement of respiratory status and was decannulated on 9/22.  Difficulty with neurogenic bladder requiring intermittent caths and discharged with recommendation on following up with urology outpatient.  He was discharged home in stable condition with recommendation of home health therapy on 07/17/2019.  He unfortunately was readmitted on 07/20/2019 with septic shock due to Klebsiella pneumoniae bacteremia with UTI and hypoxic respiratory failure bilateral lung infiltrates due to rhinovirus.  Required intubation briefly and treated with broad-spectrum antibiotics eventually narrowed with recommendation of 2-week total antibiotic course.  Hospital course significant for AKI with abnormal LFTs due to shock liver, intermittent hypoxic episode as well as significant oral secretions with dysphonia.  He was discharged back to CIR for ongoing therapy due to debility.  Ongoing deficits of severe pharyngeal dysphagia and mild memory deficits affecting recall but showed improvement of activity tolerance, balance and postural control as well as ability to compensate for deficits and was discharged home with home health therapy on 08/08/2019.  [name] is a 63 year old male who is being seen today for hospital follow-up accompanied by his wife.  He has been doing well since discharge with residual left-sided weakness, imbalance and dysphagia.  He does endorse ongoing improvement with home health therapies and plans on transitioning to outpatient therapies once completed.  He does endorse occasional sharp stabbing sensation with flexion of his left wrist and will resolve with extension.  Denies radiating pain in the wrist or fingers.  He will also experience intermittent shock type  sensations on right upper and lower extremity.  He was started on gabapentin 250 mg at bedtime with little improvement.  He continues to use a rolling  walker for ambulation as well as AFO brace and denies any recent falls.  Remains n.p.o. and receives all nutrition, hydration and medications through PEG tube.  He does endorse ongoing difficulty with excretions and does have suction device at home for assistance.  Improvement of neurogenic bladder and has been voiding independently.  PCP recently discontinued citalopram as he did not have residual depression/anxiety symptoms.  Depression/anxiety has been stable at this time.  He has continued on aspirin and Plavix without bleeding or bruising.  Continues on atorvastatin without myalgias.  Recent lipid panel obtained by PCP with improvement of LDL 55.  Blood pressure today satisfactory at 125/95.  Glucose levels greatly improved with recent A1c 7.3.  No further concerns at this time.     ROS:   14 system review of systems performed and negative with exception of weakness, imbalance and dysphagia  PMH:  Past Medical History:  Diagnosis Date   Acute CVA (cerebrovascular accident) ([place]) 05/30/2019   Acute kidney injury ([place])    Acute respiratory failure ([place])    Diabetes ([place])    Pneumonia    aspiration pneumonia   Septic shock ([place])    Stroke ([place])    Urinary tract infection    Urinary tract infection without hematuria     PSH:  Past Surgical History:  Procedure Laterality Date   CHOLECYSTECTOMY     IR GASTROSTOMY TUBE MOD SED  07/04/2019    Social History:  Social History   Socioeconomic History   Marital status: Married    Spouse name: Not on file   Number of children: Not on file   Years of education: Not on file   Highest education level: Not on file  Occupational History   Not on file  Social Needs   Financial resource strain: Not on file   Food insecurity    Worry: Not on file    Inability: Not on file   Transportation needs    Medical: Not on file    Non-medical: Not on file  Tobacco Use   Smoking status: Never Smoker   Smokeless tobacco:  Never Used  Substance and Sexual Activity   Alcohol use: Yes   Drug use: No   Sexual activity: Yes  Lifestyle   Physical activity    Days per week: Not on file    Minutes per session: Not on file   Stress: Not on file  Relationships   Social connections    Talks on phone: Not on file    Gets together: Not on file    Attends religious service: Not on file    Active member of club or organization: Not on file    Attends meetings of clubs or organizations: Not on file    Relationship status: Not on file   Intimate partner violence    Fear of current or ex partner: Not on file    Emotionally abused: Not on file    Physically abused: Not on file    Forced sexual activity: Not on file  Other Topics Concern   Not on file  Social History Narrative   Not on file    Family History: History reviewed. No pertinent family history.  Medications:   Current Outpatient Medications on File Prior to Visit  Medication Sig Dispense Refill   ACCU-CHEK GUIDE test strip  USE AS DIRECTED UP TO FOUR TIMES DAILY 100 strip 3   acetaminophen (TYLENOL) 325 MG tablet Place 1-2 tablets (325-650 mg total) into feeding tube every 4 (four) hours as needed for mild pain.     Amino Acids-Protein Hydrolys (FEEDING SUPPLEMENT, PRO-STAT SUGAR FREE 64,) LIQD Place 30 mLs into feeding tube 2 (two) times daily. 887 mL 0   aspirin 81 MG chewable tablet Place 1 tablet (81 mg total) into feeding tube daily.     atorvastatin (LIPITOR) 40 MG tablet Take 1 tablet (40 mg total) by mouth at bedtime. For cholesterol. 90 tablet 3   budesonide (PULMICORT) 0.25 MG/2ML nebulizer solution Take 2 mLs (0.25 mg total) by nebulization 2 (two) times daily. 60 mL 1   chlorhexidine (PERIDEX) 0.12 % solution 15 mLs by Mouth Rinse route 2 (two) times daily. 1893 mL 0   finasteride (PROSCAR) 5 MG tablet Place one tablet in GI tube once daily for urine flow. 90 tablet 1   fluticasone (FLONASE) 50 MCG/ACT nasal spray Place 2  sprays into both nostrils daily. 15.8 mL 2   gabapentin (NEURONTIN) 250 MG/5ML solution Take 2.5-5 mLs (125-250 mg total) by mouth at bedtime. For pain. 150 mL 0   glycopyrrolate (ROBINUL) 1 MG tablet Place 2 tablets (2 mg total) into feeding tube 3 (three) times daily. 180 tablet 1   guaifenesin (ROBITUSSIN) 100 MG/5ML syrup PLACE 10 MLS (200 MG TOTAL) INTO FEEDING TUBE 4 (FOUR) TIMES DAILY - WITH MEALS AND AT BEDTIME. 948 mL 0   guaiFENesin-dextromethorphan (ROBITUSSIN DM) 100-10 MG/5ML syrup Place 15 mLs into feeding tube every 6 (six) hours as needed for cough. 473 mL 0   Insulin Glargine (BASAGLAR KWIKPEN [place]) Inject 13 Units into the skin 2 (two) times daily.     Insulin Pen Needle (PEN NEEDLES) 32G X 6 MM MISC 1 application by Does not apply route daily. 100 each 0   lidocaine (LIDODERM) 5 % Apply at 8 pm and remove at 8 am daily. 30 patch 0   mouth rinse LIQD solution 15 mLs by Mouth Rinse route 5 (five) times daily. 946 mL 1   Nutritional Supplements (FEEDING SUPPLEMENT, OSMOLITE 1.5 CAL,) LIQD Place 325 mLs into feeding tube 4 (four) times daily.  0   sennosides (SENOKOT) 8.8 MG/5ML syrup Place 5 mLs into feeding tube 2 (two) times daily as needed for mild constipation. 240 mL 0   Water For Irrigation, Sterile (FREE WATER) SOLN Place 200 mLs into feeding tube every 8 (eight) hours. Use filtered or bottled water (not distilled water)     No current facility-administered medications on file prior to visit.     Allergies:  No Known Allergies   Physical Exam  Vitals:   09/11/19 1412  BP: 125/75  Pulse: 90  Temp: 98.6 F (37 C)  TempSrc: Oral  Weight: (!) 309 lb 3.2 oz (140.3 kg)  Height: 6' (1.829 m)   Body mass index is 41.94 kg/m. No exam data present  Depression screen [facility] 2/9 09/12/2019  Decreased Interest 0  Down, Depressed, Hopeless 0  PHQ - 2 Score 0  Altered sleeping 0  Tired, decreased energy 3  Change in appetite 0  Feeling bad or failure about  yourself  0  Trouble concentrating 0  Moving slowly or fidgety/restless 0  Suicidal thoughts 0  PHQ-9 Score 3  Difficult doing work/chores -     General: well developed, well nourished, pleasant middle-age African-American male, seated, in no evident distress Head:  head normocephalic and atraumatic.   Neck: supple with no carotid or supraclavicular bruits Cardiovascular: regular rate and rhythm, no murmurs Musculoskeletal: no deformity Skin:  no rash/petichiae; PEG tube intact Vascular:  Normal pulses all extremities   Neurologic Exam Mental Status: Awake and fully alert. Oriented to place and time. Recent and remote memory intact. Attention span, concentration and fund of knowledge appropriate. Mood and affect appropriate.  Cranial Nerves: Fundoscopic exam reveals sharp disc margins. Pupils equal, briskly reactive to light. Extraocular movements full without nystagmus. Visual fields full to confrontation. Hearing intact. Facial sensation intact.  Left lower facial weakness Motor: Normal bulk and tone. Normal strength in all tested extremity muscles. LUE: 5/5 strength with mildly decreased left hand dexterity LLE: Mild left hip flexor weakness and ankle dorsiflexion weakness with left AFO brace in place Sensory.: intact to touch , pinprick , position and vibratory sensation.  Coordination: Rapid alternating movements normal in all extremities. Finger-to-nose and heel-to-shin performed accurately bilaterally. Gait and Station: Arises from chair without difficulty. Stance is normal. Gait demonstrates normal stride length and balance with use of rolling walker Reflexes: 1+ and symmetric. Toes downgoing.    NIHSS  2 Modified Rankin  3   Diagnostic Data (Labs, Imaging, Testing)   CT head No acute abnormality. Mild small vessel disease.     MRI  Small lateral L medullary infarct. Small vessel disease white matter and pons.   CTA head & neck no ELVO. Diffuse intracranial  atherosclerosis. Mod supraclinoid B ICAs. Mild stenosis L VA origin  2D Echo EF 60-65%. No source of embolus   LDL 135  HgbA1c 10.6    ASSESSMENT: Michael Bright is a 63 y.o. 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 Vascular risk factors include HTN, HLD, DM, EtOH use and morbid obesity.  Prolonged hospital course due to complications including hypoxic respiratory failure with placement of tracheostomy, PEG tube due to dysphagia post stroke, neurogenic bladder post stroke and aspiration pneumonia.  He returned shortly after CIR discharge due to septic shock requiring additional inpatient rehab stay.  Trach tube removed and improvement of neurogenic bladder.  He has greatly recovered with residual mild right hemiparesis, severe dysphagia NPO requiring TF, and imbalance.    PLAN:  1. Left lateral medullary syndrome: Continue aspirin 81 mg daily  and atorvastatin for secondary stroke prevention.  Advised to discontinue Plavix as 3 months DAPT completed.  Maintain strict control of hypertension with blood pressure goal below 130/90, diabetes with hemoglobin A1c goal below 6.5% and cholesterol with LDL cholesterol (bad cholesterol) goal below 70 mg/dL.  I also advised the patient to eat a healthy diet with plenty of whole grains, cereals, fruits and vegetables, exercise regularly with at least 30 minutes of continuous activity daily and maintain ideal body weight. 2. HTN: Advised to continue current treatment regimen.  Today's BP stable.  Advised to continue to monitor at home along with continued follow-up with PCP for management 3. HLD: Advised to continue current treatment regimen along with continued follow-up with PCP for future prescribing and monitoring of lipid panel 4. DMII: Advised to continue to monitor glucose levels at home  along with continued follow-up with PCP for management and monitoring 5. Residual deficits: Continue home health therapies with changes in to outpatient therapies once completed.  Continue to follow with physical and medicine rehab Dr. Riley Kill as scheduled  Follow up in 3 months or call earlier if needed   Greater than 50% of time during this 45 minute visit was spent on counseling, explanation of diagnosis of left lateral medullary syndrome, reviewing risk factor management of HTN, HLD and DM, planning of further management along with potential future management, and discussion with patient and family answering all questions.    Ihor Austin, AGNP-BC  [facility] [address] [place], [place] [address]  Phone [phone] Fax [phone] Note: This document was prepared with digital dictation and possible smart phrase technology. Any transcriptional errors that result from this process are unintentional.

## 2019-09-12 ENCOUNTER — Encounter: Payer: Self-pay | Admitting: Adult Health

## 2019-09-20 ENCOUNTER — Encounter: Payer: Self-pay | Admitting: Physical Medicine & Rehabilitation

## 2019-09-20 ENCOUNTER — Other Ambulatory Visit: Payer: Self-pay

## 2019-09-20 ENCOUNTER — Encounter
Payer: BC Managed Care – PPO | Attending: Physical Medicine & Rehabilitation | Admitting: Physical Medicine & Rehabilitation

## 2019-09-20 VITALS — BP 141/91 | HR 89 | Temp 97.3°F | Ht 72.0 in | Wt 306.0 lb

## 2019-09-20 DIAGNOSIS — I639 Cerebral infarction, unspecified: Secondary | ICD-10-CM | POA: Insufficient documentation

## 2019-09-20 DIAGNOSIS — G464 Cerebellar stroke syndrome: Secondary | ICD-10-CM

## 2019-09-20 DIAGNOSIS — E1165 Type 2 diabetes mellitus with hyperglycemia: Secondary | ICD-10-CM | POA: Diagnosis present

## 2019-09-20 DIAGNOSIS — M720 Palmar fascial fibromatosis [Dupuytren]: Secondary | ICD-10-CM | POA: Diagnosis not present

## 2019-09-20 DIAGNOSIS — G4733 Obstructive sleep apnea (adult) (pediatric): Secondary | ICD-10-CM | POA: Insufficient documentation

## 2019-09-20 DIAGNOSIS — R5381 Other malaise: Secondary | ICD-10-CM | POA: Insufficient documentation

## 2019-09-20 DIAGNOSIS — I69391 Dysphagia following cerebral infarction: Secondary | ICD-10-CM | POA: Insufficient documentation

## 2019-09-20 DIAGNOSIS — Z9989 Dependence on other enabling machines and devices: Secondary | ICD-10-CM | POA: Insufficient documentation

## 2019-09-20 DIAGNOSIS — G463 Brain stem stroke syndrome: Secondary | ICD-10-CM

## 2019-09-20 HISTORY — DX: Brain stem stroke syndrome: G46.3

## 2019-09-20 NOTE — Progress Notes (Signed)
Subjective:    Patient ID: Michael Bright, male    DOB: 16-Jan-1956, 63 y.o.   MRN: [id number]  HPI   Michael Bright is here in follow-up of his left lateral medullary infarct and subsequent readmission for urosepsis.  We brought him back the second time for debility related to this.  He has had persistent severe oropharyngeal dysphagia and remains n.p.o.  Speech is coming out to the house for treatments.  He is also receiving home health PT and OT.  His ambulation is improving.  He is walking with a rolling walker and no other adaptive devices.  Mood has been fairly upbeat.  He does complain of being fatigued quite a bit.  Sleep is reasonable.  He is wearing his CPAP at night.  He is anxious to start eating again.  It sounds if his speech has worked on trials of ice chips and sips of water.  He still notes that his right arm especially is tingling and numb.  Is not frankly painful.  He remains on low-dose gabapentin at nighttime.  He does complain of some pain in the left palm.  Pain Inventory Average Pain 7 Pain Right Now 5 My pain is constant, sharp and stabbing  In the last 24 hours, has pain interfered with the following? General activity 5 Relation with others 5 Enjoyment of life 5 What TIME of day is your pain at its worst? morning Sleep (in general) Fair  Pain is worse with: standing Pain improves with: rest Relief from Meds: .  Mobility use a cane use a walker ability to climb steps?  yes do you drive?  no  Function disabled: date disabled .  Neuro/Psych numbness trouble walking dizziness anxiety  Prior Studies Any changes since last visit?  no  Physicians involved in your care Any changes since last visit?  no   No family history on file. Social History   Socioeconomic History  . Marital status: Married    Spouse name: Not on file  . Number of children: Not on file  . Years of education: Not on file  . Highest education level: Not on file  Occupational  History  . Not on file  Social Needs  . Financial resource strain: Not on file  . Food insecurity    Worry: Not on file    Inability: Not on file  . Transportation needs    Medical: Not on file    Non-medical: Not on file  Tobacco Use  . Smoking status: Never Smoker  . Smokeless tobacco: Never Used  Substance and Sexual Activity  . Alcohol use: Yes  . Drug use: No  . Sexual activity: Yes  Lifestyle  . Physical activity    Days per week: Not on file    Minutes per session: Not on file  . Stress: Not on file  Relationships  . Social Musician on phone: Not on file    Gets together: Not on file    Attends religious service: Not on file    Active member of club or organization: Not on file    Attends meetings of clubs or organizations: Not on file    Relationship status: Not on file  Other Topics Concern  . Not on file  Social History Narrative  . Not on file   Past Surgical History:  Procedure Laterality Date  . CHOLECYSTECTOMY    . IR GASTROSTOMY TUBE MOD SED  07/04/2019   Past Medical History:  Diagnosis Date  . Acute CVA (cerebrovascular accident) ([place]) 05/30/2019  . Acute kidney injury ([place])   . Acute respiratory failure ([place])   . Diabetes ([place])   . Pneumonia    aspiration pneumonia  . Septic shock ([place])   . Stroke ([place])   . Urinary tract infection   . Urinary tract infection without hematuria    BP (!) 141/91   Pulse 89   Temp (!) 97.3 F (36.3 C)   Ht 6' (1.829 m)   Wt (!) 306 lb (138.8 kg)   SpO2 97%   BMI 41.50 kg/m   Opioid Risk Score:   Fall Risk Score:  `1  Depression screen PHQ 2/9  Depression screen [place] 2/9 09/12/2019 08/22/2019  Decreased Interest 0 0  Down, Depressed, Hopeless 0 0  PHQ - 2 Score 0 0  Altered sleeping 0 0  Tired, decreased energy 3 3  Change in appetite 0 0  Feeling bad or failure about yourself  0 0  Trouble concentrating 0 0  Moving slowly or fidgety/restless 0 0  Suicidal thoughts 0 0  PHQ-9 Score 3  3  Difficult doing work/chores - Not difficult at all     Review of Systems  Constitutional: Negative.   HENT: Negative.   Eyes: Negative.   Respiratory: Positive for apnea.   Cardiovascular: Negative.   Gastrointestinal: Negative.   Endocrine: Negative.   Genitourinary: Negative.   Musculoskeletal: Positive for gait problem.  Skin: Negative.   Allergic/Immunologic: Negative.   Neurological: Positive for numbness.  Hematological: Negative.   Psychiatric/Behavioral: The patient is nervous/anxious.   All other systems reviewed and are negative.      Objective:   Physical Exam  General: Alert and oriented x 3, No apparent distress. obese HEENT: Head is normocephalic, atraumatic, PERRLA, EOMI, sclera anicteric, oral mucosa pink and moist, dentition intact, ext ear canals clear,  Neck: Supple without JVD or lymphadenopathy Heart: Reg rate and rhythm. No murmurs rubs or gallops Chest: CTA bilaterally without wheezes, rales, or rhonchi; no distress Abdomen: Soft, non-tender, non-distended, bowel sounds positive. Extremities: No clubbing, cyanosis, or edema. Pulses are 2+ Skin: Clean and intact without signs of breakdown Neuro: dysarthric, wet voice, sensory loss right arm and legs.    . Ataxia with LUE and LLE although controlled when ambulating with walker. Cognitively intact. Psych: Pt's affect is appropriate. Pt is cooperative Musculoskeletal: Dupuytren's contractures of the left fourth and third fingers.  Area is tender to touch      Assessment & Plan:  1.Recent left lateral medullary infarct with balance deficits and severe oropharyngeal dysphagia   -continue HH therapies PT, OT, SLP  -may have e-stim for dysphagia   -continue NPO  -consider transition to outpt soon.  2 DM: Controlled with current meds 3. OSA on CPAP: PCP Following.  3. Morbid obesity. Diet discussed, still NPO.  4. Fatigue  -optimize sleep  -using CPAP  -?stop gabapentin  -avoid naps during the  day.  -consider hormonal work up 5.  Mild Dupuytren's contractures of the left third and fourth fingers  -Home exercise program was provided  Fifteen minutes of face to face patient care time were spent during this visit. All questions were encouraged and answered.  Follow up with me in 6 WEEKS .

## 2019-09-20 NOTE — Patient Instructions (Signed)
MAXIMIZE ENERGY:  1. 8-10 HOURS OF SLEEP 2. USE CPAP 3. ?STOP GABAPENTIN 4. AVOID NAPS DURING THE DAY 5. KEEP SUGARS TIGHTLY CONTROLLED

## 2019-09-25 ENCOUNTER — Other Ambulatory Visit: Payer: Self-pay | Admitting: Physical Medicine and Rehabilitation

## 2019-09-25 ENCOUNTER — Telehealth: Payer: Self-pay

## 2019-09-25 ENCOUNTER — Other Ambulatory Visit: Payer: Self-pay | Admitting: Primary Care

## 2019-09-25 DIAGNOSIS — G463 Brain stem stroke syndrome: Secondary | ICD-10-CM

## 2019-09-25 DIAGNOSIS — M79603 Pain in arm, unspecified: Secondary | ICD-10-CM

## 2019-09-25 DIAGNOSIS — M79606 Pain in leg, unspecified: Secondary | ICD-10-CM

## 2019-09-25 DIAGNOSIS — E669 Obesity, unspecified: Secondary | ICD-10-CM

## 2019-09-25 NOTE — Telephone Encounter (Signed)
Lia Foyer called [phone] needs an order faxed to [facility] FAX [phone] for rollator - states he left this message last week also

## 2019-09-25 NOTE — Telephone Encounter (Signed)
He did?  Order for rollator in [place]

## 2019-09-26 ENCOUNTER — Telehealth: Payer: Self-pay

## 2019-09-26 DIAGNOSIS — G463 Brain stem stroke syndrome: Secondary | ICD-10-CM

## 2019-09-26 NOTE — Telephone Encounter (Signed)
Refill sent to pharmacy.   

## 2019-09-26 NOTE — Telephone Encounter (Addendum)
Re-faxed today.  Confirmation page printed and kept for verification.

## 2019-09-26 NOTE — Telephone Encounter (Signed)
Last prescribed on 08/24/2019 . Last appointment on 08/24/2019 (est care and hopstial follow up)    . Next future appointment on 11/24/2019

## 2019-09-26 NOTE — Telephone Encounter (Signed)
Lorriane Shire, ST from [facility] called stating can't fulfill order for e-stim because don't have proper equipment. Recommended to send referral to Outpt therapy.

## 2019-09-26 NOTE — Telephone Encounter (Signed)
Needs to be refilled and managed by patients primary care provider. 

## 2019-09-27 NOTE — Telephone Encounter (Signed)
Well, if he's receiving HH therapies, and I send him to outpt SLP, this will disqualify him from [facility] services. Furthermore, isn't SLP the one who asked for this??? (pt told me that at his last ov)

## 2019-09-27 NOTE — Telephone Encounter (Signed)
I spoke with Dominica at [facility]. She states that patient will complete ALL home health visits next week and they recommend referral to outpatient therapies (neurorehab). If outpatient is delayed until 2021 they would be happy to to extend home therapies until then.  They would need verbal orders for that. Otherwise they say patient IS ready for O/P PT OT ST

## 2019-09-27 NOTE — Telephone Encounter (Signed)
How's he doing on the gabapentin medication for his extremity pain? Does he want a refill?

## 2019-09-27 NOTE — Telephone Encounter (Signed)
All outpt therapies ordered

## 2019-09-27 NOTE — Telephone Encounter (Signed)
Message left for patient to return my call.  

## 2019-09-28 NOTE — Telephone Encounter (Signed)
Notified Vanessa at [facility] that therapies have been ordered

## 2019-09-29 ENCOUNTER — Telehealth: Payer: Self-pay | Admitting: *Deleted

## 2019-09-29 DIAGNOSIS — G463 Brain stem stroke syndrome: Secondary | ICD-10-CM

## 2019-09-29 NOTE — Telephone Encounter (Signed)
Michael Bright with [facility] called and says that Michael Bright was supposed to get a wide rollator with seat and locking brace and what he got was a standard rolling walker. Please advise.

## 2019-09-30 NOTE — Telephone Encounter (Signed)
Wide rollator ordered.

## 2019-10-02 NOTE — Telephone Encounter (Signed)
Message left for patient to return my call.  

## 2019-10-03 ENCOUNTER — Telehealth: Payer: Self-pay | Admitting: *Deleted

## 2019-10-03 DIAGNOSIS — G463 Brain stem stroke syndrome: Secondary | ICD-10-CM

## 2019-10-03 NOTE — Telephone Encounter (Signed)
1. HH therapy may re-cert 2. MBS ordered at [facility] hospital. They should contact the patient for appt.

## 2019-10-03 NOTE — Telephone Encounter (Signed)
[facility], [facility] left a message asking for a couple of orders. 1st she asking for an order for a follow up Modified Barium Swallow prior to admission into O/P neurorehab.  2nd she asking for verbal orders to recert patient patient for home health therapies until his mid January start date with neurorehab. She feels patient is needing continuation of HH in order to prevent sliding back on progress made.

## 2019-10-03 NOTE — Telephone Encounter (Signed)
LVM for [facility] [phone] to have patient scheduled for Modified Barium Swallow

## 2019-10-03 NOTE — Telephone Encounter (Signed)
Notified Vanessa [facility] of approved recert and MBS ordered.

## 2019-10-10 ENCOUNTER — Other Ambulatory Visit: Payer: Self-pay | Admitting: Primary Care

## 2019-10-10 ENCOUNTER — Other Ambulatory Visit: Payer: Self-pay | Admitting: Physical Medicine and Rehabilitation

## 2019-10-10 DIAGNOSIS — R6889 Other general symptoms and signs: Secondary | ICD-10-CM

## 2019-10-11 NOTE — Telephone Encounter (Signed)
Noted, refill sent to pharmacy. 

## 2019-10-11 NOTE — Telephone Encounter (Signed)
Last prescribed on 09/08/2019 . Last appointment on 08/24/2019. Next future appointment on 11/24/2019

## 2019-10-14 ENCOUNTER — Other Ambulatory Visit: Payer: Self-pay | Admitting: Physical Medicine and Rehabilitation

## 2019-10-14 DIAGNOSIS — E1169 Type 2 diabetes mellitus with other specified complication: Secondary | ICD-10-CM

## 2019-10-18 ENCOUNTER — Ambulatory Visit (HOSPITAL_COMMUNITY)
Admission: RE | Admit: 2019-10-18 | Discharge: 2019-10-18 | Disposition: A | Discharge status: Home / Self Care | Payer: BC Managed Care – PPO | Source: Ambulatory Visit | Attending: Physical Medicine & Rehabilitation | Admitting: Physical Medicine & Rehabilitation

## 2019-10-18 ENCOUNTER — Other Ambulatory Visit: Payer: Self-pay

## 2019-10-18 DIAGNOSIS — G463 Brain stem stroke syndrome: Secondary | ICD-10-CM

## 2019-10-18 DIAGNOSIS — I69391 Dysphagia following cerebral infarction: Secondary | ICD-10-CM | POA: Diagnosis not present

## 2019-10-18 DIAGNOSIS — R1313 Dysphagia, pharyngeal phase: Secondary | ICD-10-CM | POA: Insufficient documentation

## 2019-10-18 NOTE — Progress Notes (Signed)
Modified Barium Swallow Progress Note  Patient Details  Name: Kolbie Lepkowski MRN: [id number] Date of Birth: 1956/04/06  Today's Date: 10/18/2019  Modified Barium Swallow completed.  Full report located under Chart Review in the Imaging Section.  Brief recommendations include the following:  Clinical Impression  Pt presents with moderate to severe pharyngeal dysphagia c/b delayed swallow initiation, reduced laryngeal elevation and excursion, minimal epiglottic deflection, incomplete laryngeal closure, reduced pharyngeal peristalsis, significantly decreased UES opening, and diminished sensation.  These deficits resulted in trace penetration of all consistencies of liquid (thin, nectar, and honey).  Penetration spontaneously cleared with subsequent swallows or was easily cleared with cued cough.  There was significant pharyngeal stasis of all bolus trials (thin, nectar, honey, and puree) with severe residuals in pyriform sinuses 2/2 decreased UES opening caused by decreased laryngeal elevation.  There greatest pharyngeal clearace was acheived with nectar thick liquid, which appeared to wash some preexisting residuals with it, but the pyriform sinuses could not be fully cleared.  There was no aspiration of any consistencies trialed and only trace penetration which could be cleared.  This study reflects a significant improvement in swallow function, despite continued recommendations for NPO status.  Recommend pt continue to use PEG for nutrition.  Consider initaition of therapeutic boluses of nectar thick liquid, with possible progression to nectar thick liquids for comfort.  Per chart review and pt/family report speech therapy is considering intiation of VitalStim therapy, which my improve effectiveness of HEP when used in conjuction with swallow exercises.   Swallow Evaluation Recommendations       SLP Diet Recommendations: NPO     Continue HH ST services   Oral Care Recommendations: Oral care  QID        Celedonio Savage, MA, [place] Office: [phone] 10/18/2019,12:57 PM

## 2019-10-20 ENCOUNTER — Other Ambulatory Visit: Payer: Self-pay | Admitting: Physical Medicine and Rehabilitation

## 2019-10-25 ENCOUNTER — Other Ambulatory Visit: Payer: Self-pay | Admitting: Physical Medicine and Rehabilitation

## 2019-10-25 DIAGNOSIS — R6889 Other general symptoms and signs: Secondary | ICD-10-CM

## 2019-10-26 ENCOUNTER — Other Ambulatory Visit: Payer: Self-pay

## 2019-10-26 ENCOUNTER — Ambulatory Visit: Payer: BC Managed Care – PPO | Attending: Physical Medicine & Rehabilitation | Admitting: Speech Pathology

## 2019-10-26 ENCOUNTER — Ambulatory Visit: Payer: BC Managed Care – PPO | Admitting: Occupational Therapy

## 2019-10-26 DIAGNOSIS — R2681 Unsteadiness on feet: Secondary | ICD-10-CM | POA: Insufficient documentation

## 2019-10-26 DIAGNOSIS — M6281 Muscle weakness (generalized): Secondary | ICD-10-CM | POA: Insufficient documentation

## 2019-10-26 DIAGNOSIS — R209 Unspecified disturbances of skin sensation: Secondary | ICD-10-CM | POA: Diagnosis present

## 2019-10-26 DIAGNOSIS — R278 Other lack of coordination: Secondary | ICD-10-CM | POA: Diagnosis present

## 2019-10-26 DIAGNOSIS — R2689 Other abnormalities of gait and mobility: Secondary | ICD-10-CM | POA: Diagnosis present

## 2019-10-26 DIAGNOSIS — R208 Other disturbances of skin sensation: Secondary | ICD-10-CM

## 2019-10-26 DIAGNOSIS — R1313 Dysphagia, pharyngeal phase: Secondary | ICD-10-CM | POA: Insufficient documentation

## 2019-10-26 NOTE — Therapy (Signed)
[facility] [address] [place], [place], [address] Phone: [phone]   Fax:  [phone]  Occupational Therapy Treatment  Patient Details  Name: Michael Bright MRN: [id number] Date of Birth: 04-26-1956 Referring Provider (OT): Dr. Riley Kill   Encounter Date: 10/26/2019  OT End of Session - 10/26/19 1658    Visit Number  1    Number of Visits  25    Date for OT Re-Evaluation  01/24/20    Authorization Type  [employer]    OT Start Time  1447    OT Stop Time  1523    OT Time Calculation (min)  36 min       Past Medical History:  Diagnosis Date  . Acute CVA (cerebrovascular accident) (HCC) 05/30/2019  . Acute kidney injury (HCC)   . Acute respiratory failure (HCC)   . Diabetes (HCC)   . Pneumonia    aspiration pneumonia  . Septic shock (HCC)   . Stroke (HCC)   . Urinary tract infection   . Urinary tract infection without hematuria     Past Surgical History:  Procedure Laterality Date  . CHOLECYSTECTOMY    . IR GASTROSTOMY TUBE MOD SED  07/04/2019    There were no vitals filed for this visit.  Subjective Assessment - 10/26/19 1450    Pertinent History  Michael Bright is a 64 y.o. 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 He was readmitted on 07/20/19 with septic shock due to Klebsiella pneumoniae bacteremia with UTI and hypoxic respiratory failure bilateral lung infiltrates due to rhino virus. He was intubated briefly from 10/1-10/2 and treated with broad spectrum antibiotics till cultures finalized. Antibiotics narrowed to cefdinir on 10/5 with recommendations to complete 2 weeks total antibiotic course.  Hospital course significant for AKI  with abnormal LFTs due to shocked liver, intermittent hypoxic episode as well as significant oral secretions with dysphonia. Discharged home 08/08/19.    Patient Stated Goals  improve coordination and walking    Currently in Pain?  Yes    Pain Score  4     Pain Location  Hand    Pain Orientation  Left    Pain Descriptors / Indicators  Aching    Pain Type  Acute pain    Pain Onset  More than a month ago    Pain Frequency  Intermittent    Aggravating Factors   quick movements    Pain Relieving Factors  rest         [facility] OT Assessment - 10/26/19 1457      Assessment   Medical Diagnosis  CVA, VDRF    Referring Provider (OT)  Dr. Riley Kill    Onset Date/Surgical Date  05/30/19   referred 09/27/19   Hand Dominance  Right      Precautions   Precautions  Fall    Precaution Comments  NPO, feeding tube      Balance Screen   Has the patient fallen in the past 6 months  No    Has the patient had a decrease in activity level because of a fear of falling?   No    Is the patient reluctant to leave their home because of a fear of falling?   No  Home  Environment   Family/patient expects to be discharged to:  Private residence    Home Access  Ramped entrance    Bathroom Shower/Tub  Tub/Shower unit    Lives With  Spouse      Prior Function   Level of Independence  Independent    Vocation  On disability      ADL   Eating/Feeding  NPO    Grooming  Independent    Upper Body Bathing  Minimal assistance    Lower Body Bathing  Supervision/safety    Upper Body Dressing  Supervision/safety    Lower Body Dressing  Supervision/safety    Toilet Transfer  Supervision/safety   walker or cane   Tub/Shower Transfer  Supervision/safety      IADL   Light Housekeeping  Does not participate in any housekeeping tasks    Meal Prep  Needs to have meals prepared and served    Medication Management  Takes responsibility if medication is prepared in advance in seperate dosage      Mobility    Mobility Status  --   supervision     Written Expression   Dominant Hand  Right      Vision Assessment   Vision Assessment  Vision not tested      Cognition   Overall Cognitive Status  Within Functional Limits for tasks assessed      Sensation   Light Touch  Impaired by gross assessment   bilateral RUE>LUE   Hot/Cold  Impaired by gross assessment   bilateral     Coordination   Gross Motor Movements are Fluid and Coordinated  No    Fine Motor Movements are Fluid and Coordinated  No    9 Hole Peg Test  Right;Left    Right 9 Hole Peg Test  35.41    Left 9 Hole Peg Test  57.78      ROM / Strength   AROM / PROM / Strength  AROM;Strength      AROM   Overall AROM   Deficits    Overall AROM Comments  LUE shoulder flexion 105, abuction 90, 90% composite finger flexion on LUE, RUE sh. flexion 135      Strength   Overall Strength  Deficits    Overall Strength Comments  RUE grossly 4+/5, LUE grossly 4/5 proximally      Hand Function   Right Hand Grip (lbs)  57.5 lbs    Left Hand Grip (lbs)  56.4 lbs                         OT Short Term Goals - 10/26/19 1642      OT SHORT TERM GOAL #1   Title  I with HEP.    Time  4    Period  Weeks    Status  New    Target Date  11/25/19      OT SHORT TERM GOAL #2   Title  Pt will demonstrate improved fine motor coordination for ADLs as evidenced by decreasing 9 hole peg test score to 50 secs or less    Baseline  RUE 35.41, LUE 57.78    Time  4    Period  Weeks    Status  New      OT SHORT TERM GOAL #3   Title  Pt will demonstrate aiblity to retrievee light weight object at 110* shoulder flexion with LUE.    Time  4  Period  Weeks    Status  New      OT SHORT TERM GOAL #4   Title  Pt will perfrom light home management/ basic cooking with supervision.    Time  4    Period  Weeks    Status  New        OT Long Term Goals - 10/26/19 1645      OT LONG TERM GOAL #1   Title  I with updated HEP.    Time   12    Period  Weeks    Status  New    Target Date  01/24/20      OT LONG TERM GOAL #2   Title  Pt will demonstrate improved LUE fine motor coordination for ADLS as evidenced by decreasing 9 hole peg test score to 42 secs or less.    Time  12    Period  Weeks    Status  New      OT LONG TERM GOAL #3   Title  Pt will retrieve a lightweight object at 120 shoulder flexion with LUE.    Time  12    Period  Weeks    Status  New      OT LONG TERM GOAL #4   Title  Pt will perform mod complex cooking/ home management modified independently    Time  12    Period  Weeks    Status  New      OT LONG TERM GOAL #5   Title  Pt will perfrom all basic ADLS modified independently.    Time  12    Period  Weeks    Status  New            Plan - 10/26/19 1640    Clinical Impression Statement  Michael Bright is a 64 y.o. male with history fo T2DM, OSA, morbid obesity, recent left medullary OACZYS(0/63/01) complicated by VDRF requiring tracheostomy, prolonged antibiotics for MSSA bacteremia with copious oral secretions due to severe oropharyngeal dysphagia s/p PEG, urinary retention requiring I/O caths and was discharged to home on 07/17/19. He was readmitted on 07/20/19 with septic shock due to Klebsiella pneumoniae bacteremia with UTI and hypoxic respiratory failure bilateral lung infiltrates due to rhino virus. He was intubated briefly from 10/1-10/2 and treated with broad spectrum antibiotics till cultures finalized. Marland Kitchen  Hospital course significant for AKI with abnormal LFTs due to shocked liver, intermittent hypoxic episode as well as significant oral secretions with dysphonia. Discharged home 08/08/19.Pt presents to occupational therapy with the following deficits: decreased strength, decreased ROM, decreased endurance, decreased balance and functional mobility and decreased coordination which impedes performance of ADLs/ IADLs. Pt can benefit from skilled occupational therapy to address these  deficits in order to [place] pt's safety and indpendence with ADLS/IADLs. Pt lives with his wife. He is on disability.    OT Occupational Profile and History  Detailed Assessment- Review of Records and additional review of physical, cognitive, psychosocial history related to current functional performance    Occupational performance deficits (Please refer to evaluation for details):  ADL's;IADL's;Leisure;Social Participation    Body Structure / Function / Physical Skills  ADL;UE functional use;Flexibility;Pain;FMC;ROM;Coordination;GMC;Sensation;Decreased knowledge of precautions;Decreased knowledge of use of DME;IADL;Dexterity;Strength;Mobility;Endurance;Balance;Vision    Rehab Potential  Good    Clinical Decision Making  Limited treatment options, no task modification necessary    Comorbidities Affecting Occupational Performance:  May have comorbidities impacting occupational performance    Modification or Assistance to Complete Evaluation  No modification of tasks or assist necessary to complete eval    OT Frequency  2x / week    OT Duration  12 weeks    OT Treatment/Interventions  Self-care/ADL training;Aquatic Therapy;DME and/or AE instruction;Patient/family education;Balance training;Passive range of motion;Gait Training;Paraffin;Cryotherapy;Fluidtherapy;Splinting;Building services engineer;Contrast Bath;Electrical Stimulation;Moist Heat;Therapeutic exercise;Manual Therapy;Therapeutic activities;Neuromuscular education    Plan  initiate HEP for LUE coordination    Consulted and Agree with Plan of Care  Patient;Family member/caregiver    Family Member Consulted  wife       Patient will benefit from skilled therapeutic intervention in order to improve the following deficits and impairments:   Body Structure / Function / Physical Skills: ADL, UE functional use, Flexibility, Pain, FMC, ROM, Coordination, GMC, Sensation, Decreased knowledge of precautions, Decreased knowledge of use of DME,  IADL, Dexterity, Strength, Mobility, Endurance, Balance, Vision       Visit Diagnosis: Muscle weakness (generalized) - Plan: Ot plan of care cert/re-cert  Other lack of coordination - Plan: Ot plan of care cert/re-cert  Unsteadiness on feet - Plan: Ot plan of care cert/re-cert  Other abnormalities of gait and mobility - Plan: Ot plan of care cert/re-cert  Other disturbances of skin sensation - Plan: Ot plan of care cert/re-cert    Problem List Patient Active Problem List   Diagnosis Date Noted  . 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 Dupuytren's contracture of left hand 09/20/2019  . Hyperlipidemia 08/24/2019  . Upper and lower extremity pain 08/24/2019  . 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 (CVA) 08/09/2019  . OSA on CPAP 08/09/2019  . Copious oral secretions 08/09/2019  . Anemia of chronic disease   . Sepsis with acute renal failure without septic shock (HCC)   . Dysphagia due to recent cerebral infarction   . Diabetes mellitus type 2 in obese (HCC)   . Morbid obesity (HCC)   . Status post insertion of percutaneous endoscopic gastrostomy (PEG) tube (HCC)   . History of CVA (cerebrovascular accident) 06/27/2019  . Aspiration pneumonia (HCC) 06/14/2019  . Essential hypertension 06/14/2019  . 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 Renal insufficiency 05/30/2019  . Diplopia   . Debility     Ilsa Bonello 10/26/2019, 5:01 PM Keene Breath, OTR/L [phone] Phone: [phone] 5:01 PM 10/26/19 [facility] [address] [place], [place], [address] Phone: [phone]   Fax:  [phone]  Name: Amir Fick MRN: [id number] Date of Birth: 03/17/1956

## 2019-10-26 NOTE — Patient Instructions (Addendum)
Signs of Aspiration Pneumonia   . Chest pain/tightness . Fever (can be low grade) . Cough  o With foul-smelling phlegm (sputum) o With sputum containing pus or blood o With greenish sputum . Fatigue  . Shortness of breath  . Wheezing   **IF YOU HAVE THESE SIGNS, CONTACT YOUR DOCTOR OR GO TO THE EMERGENCY DEPARTMENT OR URGENT CARE AS SOON AS POSSIBLE**    OK to continue with ice chips and plain, pure water (small sips). Always make sure to brush your teeth, clean your mouth and tongue beforehand.   Bring your swallowing exercises with you next time.

## 2019-10-27 NOTE — Therapy (Signed)
[facility] [address] [place], [place], [address] Phone: [phone]   Fax:  [phone]  Speech Language Pathology Evaluation  Patient Details  Name: Michael Bright MRN: [id number] Date of Birth: May 04, 1956 Referring Provider (SLP): Dr. Naaman Plummer   Encounter Date: 10/26/2019  End of Session - 10/27/19 1012    Visit Number  1    Number of Visits  17    Date for SLP Re-Evaluation  12/25/19    Authorization Type  BCBS Primary, Medicare secondary    SLP Start Time  1530    SLP Stop Time   1608    SLP Time Calculation (min)  38 min    Activity Tolerance  Patient tolerated treatment well       Past Medical History:  Diagnosis Date  . Acute CVA (cerebrovascular accident) ([place]) 05/30/2019  . Acute kidney injury ([place])   . Acute respiratory failure ([place])   . Diabetes ([place])   . Pneumonia    aspiration pneumonia  . Septic shock ([place])   . Stroke ([place])   . Urinary tract infection   . Urinary tract infection without hematuria     Past Surgical History:  Procedure Laterality Date  . CHOLECYSTECTOMY    . IR GASTROSTOMY TUBE MOD SED  07/04/2019    There were no vitals filed for this visit.  Subjective Assessment - 10/27/19 1001    Subjective  "Just the swallowing." re what he would like to focus on in ST    Patient is accompained by:  Family member   [place]        SLP Evaluation [facility] - 10/26/19 1553      SLP Visit Information   SLP Received On  10/26/19    Referring Provider (SLP)  Dr. Naaman Plummer    Onset Date  05/30/19    Medical Diagnosis  CVA- Brainstem stroke      Subjective   Patient/Family Stated Goal  Just get back to swallowing      General Information   HPI   Michael Bright is a 64 yo male with hx of L medullary CVA on 05/30/19 with severe dysphagia. Pt has been PEG dependent since hospitalization. Worked with CIR and HH SLP. Repeat MBS on 10/18/19 showed improvement, however SLP continues to  recommend AMN due to moderate to severe dysphagia d/t "delayed swallow initiation, reduced laryngeal elevation and excursion, minimal epiglottic deflection, incomplete laryngeal closure, reduced pharyngeal persistalsis, significantly decreased UES opening, and diminished sensation." Bolus stasis and severe residue after the swallow; pt at moderate-severe risk for aspiration. Recommended consideration of therapeutic trials of nectar with SLP with possibility to advance to nectar liquids for comfort. SLP felt pt may benefit from Vital Stim/ NMES in conjunction with swallowing exercises to increase effectiveness.    Behavioral/Cognition  alert, cooperative    Mobility Status  ambulated to session      Balance Screen   Has the patient fallen in the past 6 months  No    Has the patient had a decrease in activity level because of a fear of falling?   No    Is the patient reluctant to leave their home because of a fear of falling?   No      Prior Functional Status   Cognitive/Linguistic Baseline  Within functional limits    Type of Home  House     Lives With  Spouse    Available Support  Family      Cognition  Overall Cognitive Status  Within Functional Limits for tasks assessed      Auditory Comprehension   Overall Auditory Comprehension  Appears within functional limits for tasks assessed      Visual Recognition/Discrimination   Discrimination  Not tested      Reading Comprehension   Reading Status  Within funtional limits      Expression   Primary Mode of Expression  Verbal      Verbal Expression   Overall Verbal Expression  Appears within functional limits for tasks assessed      Written Expression   Dominant Hand  Right    Written Expression  Not tested      Oral Motor/Sensory Function   Overall Oral Motor/Sensory Function  Other (comment)    Labial ROM  Within Functional Limits    Labial Symmetry  Within Functional Limits    Labial Strength  Within Functional Limits     Lingual ROM  Within Functional Limits    Lingual Symmetry  Within Functional Limits    Lingual Strength  Within Functional Limits    Facial ROM  Within Functional Limits    Facial Symmetry  Within Functional Limits    Velum  Within Functional Limits   however, noted CN X deficits per MBS; wet vocal qual/hoarse     Motor Speech   Overall Motor Speech  Impaired    Respiration  Within functional limits    Phonation  Wet;Hoarse    Resonance  Within functional limits    Articulation  Within functional limitis    Intelligibility  Intelligible    Motor Planning  Witnin functional limits      Standardized Assessments   Standardized Assessments   Other Assessment   EAT 10 score 29/40 (above 3 is abnormal)                     SLP Education - 10/27/19 1011    Education Details  many factors affect prognosis; SLP to work toward goal of pt taking nectar liquids for comfort, NMES in conjunction with exercises may be beneficial for pt    Person(s) Educated  Patient;Spouse    Methods  Explanation    Comprehension  Verbalized understanding       SLP Short Term Goals - 10/27/19 1023      SLP SHORT TERM GOAL #1   Title  Pt will complete dysphagia HEP with rare min A x 3 sessions.    Time  4    Period  Weeks    Status  New      SLP SHORT TERM GOAL #2   Title  Patient will tolerate electrode placement for NMES x 3 sessions.    Time  4    Period  Weeks    Status  New      SLP SHORT TERM GOAL #3   Title  Patient will tell SLP 3 s/sx of aspiration PNA.    Time  4    Period  Weeks    Status  New       SLP Long Term Goals - 10/27/19 1025      SLP LONG TERM GOAL #1   Title  Pt will demonstrate dysphagia HEP with modified independence x3 sessions.    Time  8    Period  Weeks    Status  New      SLP LONG TERM GOAL #2   Title  Pt will report thorough oral care prior to  water/ice chips x6 sessions.    Time  8    Period  Weeks    Status  New       Plan - 10/27/19  1013    Clinical Impression Statement  Michael Bright presents with moderate-severe pharyngeal phase dysphagia per MBS secondary to medullary CVA on 05/30/19. He has been PEG dependent since hospitalization. Worked with CIR and until recently [facility] SLP; has been working on teaspoon trials of nectar with SLP only since repeat MBS. Has also been taking sips of regular water and ice chips after oral care for approximately 1 month. He had aspiration PNA in October (post seizure/vomiting), but not since. Patient has been completing pharyngeal exercises and RMT (expiratory) with improvement in function noted on most recent MBS, however AMN still recommended due to pharyngeal residue and aspiration risk. I recommend skilled ST to address dysphagia for training in aspiration precautions, pharyngeal exercises, and possibly NMES to improve pt's swallow function for possible advancement to comfort feeds. Advise nectar thick liquid trials with SLP only at this time; continue free water protocol at home after rigorous oral care.    Speech Therapy Frequency  2x / week    Duration  --   8 weeks or 17 visits   Treatment/Interventions  Aspiration precaution training;Cueing hierarchy;SLP instruction and feedback;Pharyngeal strengthening exercises;NMES;Compensatory techniques;Diet toleration management by SLP;Trials of upgraded texture/liquids;Patient/family education    Potential to Achieve Goals  Good    Potential Considerations  Severity of impairments    SLP Home Exercise Plan  Continue swallow ex and RMT per [facility] SLP; bring exercises to next session    Consulted and Agree with Plan of Care  Patient;Family member/caregiver       Patient will benefit from skilled therapeutic intervention in order to improve the following deficits and impairments:   Dysphagia, pharyngeal phase    Problem List Patient Active Problem List   Diagnosis Date Noted  . 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 Dupuytren's contracture of left hand  09/20/2019  . Hyperlipidemia 08/24/2019  . Upper and lower extremity pain 08/24/2019  . 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 (CVA) 08/09/2019  . OSA on CPAP 08/09/2019  . Copious oral secretions 08/09/2019  . Anemia of chronic disease   . Sepsis with acute renal failure without septic shock (HCC)   . Dysphagia due to recent cerebral infarction   . Diabetes mellitus type 2 in obese (HCC)   . Morbid obesity (HCC)   . Status post insertion of percutaneous endoscopic gastrostomy (PEG) tube (HCC)   . History of CVA (cerebrovascular accident) 06/27/2019  . Aspiration pneumonia (HCC) 06/14/2019  . Essential hypertension 06/14/2019  . 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 Renal insufficiency 05/30/2019  . Diplopia   . Debility    Rondel Baton, MS, CCC-SLP Speech-Language Pathologist  Arlana Lindau 10/27/2019, 6:48 PM  [facility] [address] [place], [place], [address] Phone: [phone]   Fax:  [phone]  Name: Michael Bright MRN: [id number] Date of Birth: 1956/04/11

## 2019-10-30 ENCOUNTER — Other Ambulatory Visit: Payer: Self-pay | Admitting: Physical Medicine and Rehabilitation

## 2019-10-31 ENCOUNTER — Other Ambulatory Visit: Payer: Self-pay

## 2019-10-31 ENCOUNTER — Encounter: Payer: Self-pay | Admitting: Physical Therapy

## 2019-10-31 ENCOUNTER — Ambulatory Visit: Payer: BC Managed Care – PPO | Admitting: Physical Therapy

## 2019-10-31 VITALS — BP 131/69 | HR 88

## 2019-10-31 DIAGNOSIS — R1313 Dysphagia, pharyngeal phase: Secondary | ICD-10-CM | POA: Diagnosis not present

## 2019-10-31 DIAGNOSIS — M6281 Muscle weakness (generalized): Secondary | ICD-10-CM

## 2019-10-31 DIAGNOSIS — R278 Other lack of coordination: Secondary | ICD-10-CM

## 2019-10-31 DIAGNOSIS — R2681 Unsteadiness on feet: Secondary | ICD-10-CM

## 2019-10-31 DIAGNOSIS — R2689 Other abnormalities of gait and mobility: Secondary | ICD-10-CM

## 2019-10-31 DIAGNOSIS — R208 Other disturbances of skin sensation: Secondary | ICD-10-CM

## 2019-10-31 NOTE — Therapy (Signed)
[facility] [address] [place], [place], [address] Phone: [phone]   Fax:  [phone]  Physical Therapy Evaluation  Patient Details  Name: Michael Bright MRN: [id number] Date of Birth: Oct 28, 1955 Referring Provider (PT): Faith Rogue, MD   Encounter Date: 10/31/2019  PT End of Session - 10/31/19 1022    Visit Number  1    Number of Visits  25    Date for PT Re-Evaluation  01/29/20    Authorization Type  BCBS - 2021 primary, Medicare Secondary    PT Start Time  0930    PT Stop Time  1016    PT Time Calculation (min)  46 min    Equipment Utilized During Treatment  Gait belt    Activity Tolerance  Patient tolerated treatment well    Behavior During Therapy  [facility] for tasks assessed/performed       Past Medical History:  Diagnosis Date  . Acute CVA (cerebrovascular accident) (HCC) 05/30/2019  . Acute kidney injury (HCC)   . Acute respiratory failure (HCC)   . Diabetes (HCC)   . Pneumonia    aspiration pneumonia  . Septic shock (HCC)   . Stroke (HCC)   . Urinary tract infection   . Urinary tract infection without hematuria     Past Surgical History:  Procedure Laterality Date  . CHOLECYSTECTOMY    . IR GASTROSTOMY TUBE MOD SED  07/04/2019    Vitals:   10/31/19 0942  BP: 131/69  Pulse: 88     Subjective Assessment - 10/31/19 0933    Subjective  Michael Bright is a 64 y.o. 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 He was readmitted on 07/20/19 with septic shock due to Klebsiella pneumoniae bacteremia with UTI and hypoxic respiratory failure bilateral lung infiltrates due to rhino virus. He was intubated briefly from 10/1-10/2 and  treated with broad spectrum antibiotics till cultures finalized. Hospital course significant for AKI with abnormal LFTs due to shocked liver, intermittent hypoxic episode as well as significant oral secretions with dysphonia. He was discharged back to CIR for ongoing therapy due to debility. Discharged home 08/08/19 with home health therapy.  Stopped home health therapy last week. Now walking with a bariatric rollator. No falls. States that legs feel a little jumpy. Can't detect temperature on R arm and hand - states "right arm feels funny". No longer wearing AFO on L foot.    Pertinent History  PMH: CVA (L lateral medullary syndrome) diabetes, HTN, OSA, morbid obesity.    Limitations  Standing;Walking;House hold activities    How long can you stand comfortably?  can stand approx. 10 minutes    How long can you walk comfortably?  20 minutes with the rollator    Patient Stated Goals  wants to walk better, walk more steady, wants to be able to get up more easily.    Currently in Pain?  Yes    Pain Score  4     Pain Location  Hand    Pain Orientation  Left    Pain Descriptors / Indicators  Stabbing   right in the middle   Pain Type  Chronic pain   started right after the stroke   Aggravating Factors   quick movements  Pain Relieving Factors  rubbing it         [facility] PT Assessment - 10/31/19 0943      Assessment   Medical Diagnosis  CVA    Referring Provider (PT)  Faith Rogue, MD    Onset Date/Surgical Date  05/30/19    Hand Dominance  Right    Prior Therapy  CIR, home health (PT, OT,ST)      Precautions   Precautions  Fall    Precaution Comments  NPO, feeding tube      Balance Screen   Has the patient fallen in the past 6 months  No    Has the patient had a decrease in activity level because of a fear of falling?   No    Is the patient reluctant to leave their home because of a fear of falling?   No      Home Nurse, mental health  Private residence    Living  Arrangements  Spouse/significant other;Children;Other (Comment)   son in his 30s   Available Help at Discharge  Family    Type of Home  House    Home Access  Stairs to enter;Ramped entrance   can go up the ramp   Entrance Stairs-Number of Steps  4    Entrance Stairs-Rails  Can reach both    Home Layout  One level;Able to live on main level with bedroom/bathroom    Home Equipment  Other (comment);Tub bench;Grab bars - tub/shower;Grab bars - toilet;Walker - standard;Wheelchair - manual   bariatric rollator,    Additional Comments  has a reclining bed at home      Prior Function   Level of Independence  Independent    Vocation  On disability      Cognition   Overall Cognitive Status  Within Functional Limits for tasks assessed      Sensation   Light Touch  Impaired by gross assessment   unable to detect L2 dermatome on L   Hot/Cold  Impaired by gross assessment   unable to detect temperature RUE   Proprioception  Appears Intact   at B ankles     Coordination   Gross Motor Movements are Fluid and Coordinated  No    Heel Shin Test  mildly dysmetric due to weakness      Posture/Postural Control   Posture/Postural Control  Postural limitations    Postural Limitations  Rounded Shoulders      ROM / Strength   AROM / PROM / Strength  Strength      Strength   Strength Assessment Site  Ankle;Knee;Hip    Right/Left Hip  Right;Left    Right Hip Flexion  5/5    Left Hip Flexion  4+/5    Right/Left Knee  Right;Left    Right Knee Flexion  5/5    Right Knee Extension  5/5    Left Knee Flexion  5/5    Left Knee Extension  5/5    Right/Left Ankle  Right;Left    Right Ankle Dorsiflexion  5/5    Left Ankle Dorsiflexion  4+/5      Bed Mobility   Bed Mobility  Sit to Supine;Supine to Sit    Supine to Sit  Independent    Sit to Supine  Independent      Transfers   Transfers  Sit to Stand;Stand to Sit    Sit to Stand  4: Min guard;5: Supervision    Sit to Stand Details (indicate  cue type and reason)  cues for rollator brake management, cues to push off with single UE from mat table vs. BUE on rollator.     Five time sit to stand comments   31.06 seconds from higher mat table using RUE to push off from mat, needs min guard initially in standing     Stand to Sit  4: Min guard;5: Supervision    Stand to Sit Details  poor eccentric control      Ambulation/Gait   Ambulation/Gait  Yes    Ambulation/Gait Assistance  5: Supervision    Ambulation/Gait Assistance Details  cues for posture and to keep close to rollator.     Ambulation Distance (Feet)  115 Feet    Assistive device  Rollator;Other (Comment)   bariatric    Gait Pattern  Step-through pattern;Decreased stride length;Decreased dorsiflexion - left;Trunk flexed;Poor foot clearance - left    Ambulation Surface  Level;Indoor    Gait velocity  19.25 seconds = 1.70 ft/sec    Gait Comments  pt states that he has a L AFO, but is not wearing it because it weighs his foot down. unclear if HHPT cleared pt for this, monitor for decreased foot clearance on LLE      Standardized Balance Assessment   Standardized Balance Assessment  Timed Up and Go Test      Timed Up and Go Test   Normal TUG (seconds)  30.22   with bariatric rollator               Objective measurements completed on examination: See above findings.              PT Education - 10/31/19 1022    Education Details  clinical findings, POC, warning signs/symptoms of a CVA    Person(s) Educated  Patient    Methods  Explanation;Handout    Comprehension  Verbalized understanding       PT Short Term Goals - 10/31/19 1526      PT SHORT TERM GOAL #1   Title  Patient will be independent with initial HEP in order to build upon functional gains made in therapy.  ALL STGS DUE 12/13/19    Time  6   due to initial delay in scheduling   Period  Weeks    Target Date  12/13/19      PT SHORT TERM GOAL #2   Title  Patient will undergo further  assessment of BERG balance test to determine fall risk - goal to be written as appropriate.    Time  6    Period  Weeks    Status  New      PT SHORT TERM GOAL #3   Title  Patient will decrease TUG time with bariatric rollator to 25 seconds of less to demo decr fall risk.    Baseline  30.22 seconds on 10/31/19    Time  6    Period  Weeks    Status  New      PT SHORT TERM GOAL #4   Title  Patient will improve gait velocity with bariatric rollator to at least 2.1 ft/sec to demo a limited community ambulator and decr fall risk.    Baseline  1.70 ft/sec    Time  6    Period  Weeks    Status  New      PT SHORT TERM GOAL #5   Title  Patient will decrease 5x sit <> stand time to 25 seconds of less  from elevated mat table with UE support in order to demo improved functional LE strength.    Baseline  31.06 seconds    Time  6    Period  Weeks    Status  New        PT Long Term Goals - 10/31/19 1528      PT LONG TERM GOAL #1   Title  Patient will be independent with final HEP in order to build upon functional gains made in therapy. ALL LTGS DUE 02/06/20    Time  14   due to delay in scheduling   Period  Weeks    Status  New    Target Date  02/06/20      PT LONG TERM GOAL #2   Title  BERG goal to be written as appropriate to determine fall risk.    Time  14    Period  Weeks    Status  New      PT LONG TERM GOAL #3   Title  Patient will decrease 5x sit <> stand time to 19 seconds of less from elevated mat table vs. standard chair with UE support in order to demo improved functional LE strength.    Time  14    Period  Weeks    Status  New      PT LONG TERM GOAL #4   Title  Patient will improve gait velocity with bariatric rollator vs. LRAD to at least 2.6 ft/sec to demo improved community mobility.    Time  14    Period  Weeks    Status  New      PT LONG TERM GOAL #5   Title  Patient will decrease TUG time with bariatric rollator vs. LRAD to 19 seconds of less to demo decr  fall risk.    Time  14    Period  Weeks    Status  New      Additional Long Term Goals   Additional Long Term Goals  Yes      PT LONG TERM GOAL #6   Title  Patient will ambulate at least 300' with supervision over level indoor and outdoor surfaces with LRAD  with supervision in order to improve community mobility.    Time  14    Period  Weeks    Status  New             Plan - 10/31/19 1023    Clinical Impression Statement  Patient is a  64 year old male referred to Neuro OPPT for evaluation with primary concern of left medullary CVA (05/30/19).  Pt has been PEG dependent since hospitalization. Pt's PMH is significant for: diabetes, HTN, OSA, morbid obesity. The following deficits were present during the exam: impaired balance, decreased safety awareness, impaired sensation, decreased functional LE strength, gait abnormalities . Pt's TUG and gait speed scores indicate pt is at a high risk for falls. Will further assess fall risk by performing BERG at next session. Pt would benefit from skilled PT to address these impairments and functional limitations to maximize functional mobility independence and decrease fall risk.    Personal Factors and Comorbidities  Comorbidity 3+;Past/Current Experience;Time since onset of injury/illness/exacerbation    Comorbidities  diabetes, HTN, OSA, morbid obesity.    Examination-Activity Limitations  Squat;Transfers;Locomotion Level;Stairs    Examination-Participation Restrictions  Community Activity    Stability/Clinical Decision Making  Evolving/Moderate complexity    Clinical Decision Making  Moderate    Rehab  Potential  Good    PT Frequency  2x / week    PT Duration  12 weeks    PT Treatment/Interventions  ADLs/Self Care Home Management;Balance training;Therapeutic exercise;Therapeutic activities;Functional mobility training;Stair training;Gait training;DME Instruction;Aquatic Therapy;Neuromuscular re-education;Patient/family education;Orthotic  Fit/Training;Energy conservation    PT Next Visit Plan  perform BERG to assess fall risk, initial HEP for LE strengthening/balance (check what pt has been previously working on with HHPT - add and revise as appropriate), sit <> stands, any functional LE strengthening.    Consulted and Agree with Plan of Care  Patient;Family member/caregiver    Family Member Consulted  wife, Cherly HensenBernadette       Patient will benefit from skilled therapeutic intervention in order to improve the following deficits and impairments:  Abnormal gait, Decreased activity tolerance, Decreased balance, Decreased coordination, Decreased range of motion, Decreased knowledge of use of DME, Decreased knowledge of precautions, Decreased endurance, Decreased strength, Difficulty walking, Postural dysfunction, Impaired sensation, Obesity, Pain  Visit Diagnosis: Muscle weakness (generalized)  Other lack of coordination  Unsteadiness on feet  Other abnormalities of gait and mobility  Other disturbances of skin sensation     Problem List Patient Active Problem List   Diagnosis Date Noted  . 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 Dupuytren's contracture of left hand 09/20/2019  . Hyperlipidemia 08/24/2019  . Upper and lower extremity pain 08/24/2019  . 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 (CVA) 08/09/2019  . OSA on CPAP 08/09/2019  . Copious oral secretions 08/09/2019  . Anemia of chronic disease   . Sepsis with acute renal failure without septic shock (HCC)   . Dysphagia due to recent cerebral infarction   . Diabetes mellitus type 2 in obese (HCC)   . Morbid obesity (HCC)   . Status post insertion of percutaneous endoscopic gastrostomy (PEG) tube (HCC)   . History of CVA (cerebrovascular accident) 06/27/2019  . Aspiration pneumonia (HCC) 06/14/2019  . Essential hypertension 06/14/2019  . 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 Renal  insufficiency 05/30/2019  . Diplopia   . Debility     Drake Leachhloe N Anamika Kueker, PT, DPT  10/31/2019, 9:32 PM  [facility] [address] [place], [place], [address] Phone: [phone]   Fax:  [phone]  Name: Michael Bright MRN: [id number] Date of Birth: 23-Sep-1956

## 2019-10-31 NOTE — Patient Instructions (Signed)
Warning Signs of a Stroke  A stroke is a medical emergency and should be treated right away--every second counts. A stroke is caused by a decrease or block in blood flow to the brain. When this occurs, certain areas of the brain do not get enough oxygen, and brain cells begin to die. A stroke can lead to brain damage and can sometimes be life-threatening. However, if someone having a stroke gets medical treatment right away, he or she has better chances of surviving and recovering from the stroke. Being able to recognize the symptoms of a stroke is very important. Types of strokes There are two main types of strokes:  Ischemic strokes. This is the most common type of stroke. These strokes happen when a blood vessel that supplies blood to the brain is being blocked.  Hemorrhagic strokes. These strokes result from bleeding in the brain due to a blood vessel leaking or bursting (rupturing). A transient ischemic attack (TIA) is a "warning stroke" that causes stroke-like symptoms that go away quickly. Unlike a stroke, a TIA does not cause permanent damage to the brain. However, the symptoms of a TIA are the same as a stroke, and they also require medical treatment right away. Having a TIA is a sign that you are at higher risk for a permanent stroke. Warning signs of a stroke The symptoms of stroke may vary and will reflect the part of the brain that is involved. Symptoms usually happen suddenly. "BE FAST" is an easy way to remember the main warning signs of a stroke. B - Balance Signs are dizziness, sudden trouble walking, or loss of balance. E - Eyes Signs are trouble seeing or a sudden change in vision. F - Face Signs are sudden weakness or numbness of the face, or the face or eyelid drooping on one side. A - Arms Signs are weakness or numbness in an arm. This happens suddenly and usually on one side of the body. S - Speech Signs are sudden trouble speaking, slurred speech, or trouble  understanding what people say. T - Time Time to call emergency services. Write down what time symptoms started. Other signs of a stroke Some less common signs of a stroke include:  A sudden, severe headache with no known cause.  Nausea or vomiting.  Seizure. A stroke may be happening even if only one "BE FAST" symptoms is present. These symptoms may represent a serious problem that is an emergency. Do not wait to see if the symptoms will go away. Get medical help right away. Call your local emergency services (911 in the [place] Do not drive yourself to the hospital. Summary  A stroke is a medical emergency and should be treated right away--every second counts.  "BE FAST" is an easy way to remember the main warning signs of a stroke.  Call local emergency services right away if you or someone else has any stroke symptoms, even if the symptoms go away.  Make note of what time the first symptoms appeared. Emergency responders or emergency room staff will need to know this information.  Do not wait to see if symptoms will go away. Call 911 even if only one of the "BE FAST" symptoms appears. This information is not intended to replace advice given to you by your health care provider. Make sure you discuss any questions you have with your health care provider. Document Revised: 09/17/2017 Document Reviewed: 01/22/2017 Elsevier Patient Education  2020 Elsevier Inc.  

## 2019-11-01 ENCOUNTER — Encounter: Payer: Self-pay | Admitting: Physical Medicine & Rehabilitation

## 2019-11-01 ENCOUNTER — Encounter
Payer: BC Managed Care – PPO | Attending: Physical Medicine & Rehabilitation | Admitting: Physical Medicine & Rehabilitation

## 2019-11-01 VITALS — BP 126/78 | HR 79 | Temp 98.1°F | Ht 72.0 in | Wt 305.0 lb

## 2019-11-01 DIAGNOSIS — M79606 Pain in leg, unspecified: Secondary | ICD-10-CM | POA: Insufficient documentation

## 2019-11-01 DIAGNOSIS — E1165 Type 2 diabetes mellitus with hyperglycemia: Secondary | ICD-10-CM | POA: Insufficient documentation

## 2019-11-01 DIAGNOSIS — R5381 Other malaise: Secondary | ICD-10-CM | POA: Diagnosis present

## 2019-11-01 DIAGNOSIS — R5382 Chronic fatigue, unspecified: Secondary | ICD-10-CM | POA: Diagnosis present

## 2019-11-01 DIAGNOSIS — G4733 Obstructive sleep apnea (adult) (pediatric): Secondary | ICD-10-CM | POA: Diagnosis present

## 2019-11-01 DIAGNOSIS — M79603 Pain in arm, unspecified: Secondary | ICD-10-CM | POA: Diagnosis present

## 2019-11-01 DIAGNOSIS — G463 Brain stem stroke syndrome: Secondary | ICD-10-CM | POA: Diagnosis not present

## 2019-11-01 DIAGNOSIS — Z9989 Dependence on other enabling machines and devices: Secondary | ICD-10-CM | POA: Diagnosis present

## 2019-11-01 DIAGNOSIS — I639 Cerebral infarction, unspecified: Secondary | ICD-10-CM | POA: Insufficient documentation

## 2019-11-01 DIAGNOSIS — I69391 Dysphagia following cerebral infarction: Secondary | ICD-10-CM | POA: Insufficient documentation

## 2019-11-01 MED ORDER — CHLORHEXIDINE GLUCONATE 0.12 % MT SOLN: 15.0000 mL | Freq: Two times a day (BID) | OROMUCOSAL | 3 refills | Status: DC

## 2019-11-01 MED ORDER — PRO-STAT SUGAR FREE PO LIQD: 30.0000 mL | Freq: Two times a day (BID) | ORAL | 6 refills | Status: DC

## 2019-11-01 MED ORDER — GABAPENTIN 250 MG/5ML PO SOLN: 125.0000 mg | Freq: Every day | ORAL | 6 refills | Status: DC

## 2019-11-01 NOTE — Progress Notes (Signed)
Subjective:    Patient ID: Michael Bright, male    DOB: Mar 16, 1956, 64 y.o.   MRN: [id number]  HPI   Michael Bright is here in follow-up of his left medullary infarct.  He continues to make gradual progress.  He has begun therapies at outpatient neuro rehab.   He had a repeat MBS on 10/18/19: There was significant pharyngeal stasis of all bolus trials (thin, nectar, honey, and puree) with severe residuals in pyriform sinuses 2/2 decreased UES opening caused by decreased laryngeal elevation.  There greatest pharyngeal clearace was acheived with nectar thick liquid, which appeared to wash some preexisting residuals with it, but the pyriform sinuses could not be fully cleared.  There was no aspiration of any consistencies trialed and only trace penetration which could be cleared.  This study reflects a significant improvement in swallow function, despite continued recommendations for NPO status.  Patient has gained some weight while being on tube feeds.  However he claims that he is not satiated by the tube feeds.  He is on Osmolite daily with prostat for protein supplementation.  His pain levels are fairly stable.  He has some pain at nighttime in his left arm and leg when he awakens.  He remains on gabapentin 250 mg at bedtime for sleep and pain.  He also reports ongoing fatigue and asked if his anything we could do for this.  He is using his CPAP each night.  No new medication changes have been initiated.  Pain Inventory Average Pain 4 Pain Right Now 4 My pain is intermittent, sharp, stabbing and aching  In the last 24 hours, has pain interfered with the following? General activity 4 Relation with others 0 Enjoyment of life 7 What TIME of day is your pain at its worst? morning Sleep (in general) Fair  Pain is worse with: some activites Pain improves with: other Relief from Meds: 0  Mobility walk with assistance use a walker how many minutes can you walk? 10-15 ability to climb  steps?  yes do you drive?  no  Function disabled: date disabled 2000  Neuro/Psych trouble walking  Prior Studies Any changes since last visit?  no  Physicians involved in your care Any changes since last visit?  no   History reviewed. No pertinent family history. Social History   Socioeconomic History  . Marital status: Married    Spouse name: Not on file  . Number of children: Not on file  . Years of education: Not on file  . Highest education level: Not on file  Occupational History  . Not on file  Tobacco Use  . Smoking status: Never Smoker  . Smokeless tobacco: Never Used  Substance and Sexual Activity  . Alcohol use: Yes  . Drug use: No  . Sexual activity: Yes  Other Topics Concern  . Not on file  Social History Narrative  . Not on file   Social Determinants of Health   Financial Resource Strain:   . Difficulty of Paying Living Expenses: Not on file  Food Insecurity:   . Worried About Programme researcher, broadcasting/film/video in the Last Year: Not on file  . Ran Out of Food in the Last Year: Not on file  Transportation Needs:   . Lack of Transportation (Medical): Not on file  . Lack of Transportation (Non-Medical): Not on file  Physical Activity:   . Days of Exercise per Week: Not on file  . Minutes of Exercise per Session: Not on file  Stress:   .  Feeling of Stress : Not on file  Social Connections:   . Frequency of Communication with Friends and Family: Not on file  . Frequency of Social Gatherings with Friends and Family: Not on file  . Attends Religious Services: Not on file  . Active Member of Clubs or Organizations: Not on file  . Attends Banker Meetings: Not on file  . Marital Status: Not on file   Past Surgical History:  Procedure Laterality Date  . CHOLECYSTECTOMY    . IR GASTROSTOMY TUBE MOD SED  07/04/2019   Past Medical History:  Diagnosis Date  . Acute CVA (cerebrovascular accident) (HCC) 05/30/2019  . Acute kidney injury (HCC)   . Acute  respiratory failure (HCC)   . Diabetes (HCC)   . Pneumonia    aspiration pneumonia  . Septic shock (HCC)   . Stroke (HCC)   . Urinary tract infection   . Urinary tract infection without hematuria    BP 126/78   Pulse 79   Temp 98.1 F (36.7 C)   Ht 6' (1.829 m)   Wt (!) 305 lb (138.3 kg)   SpO2 96%   BMI 41.37 kg/m   Opioid Risk Score:   Fall Risk Score:  `1  Depression screen PHQ 2/9  Depression screen [facility] 2/9 09/12/2019 08/22/2019  Decreased Interest 0 0  Down, Depressed, Hopeless 0 0  PHQ - 2 Score 0 0  Altered sleeping 0 0  Tired, decreased energy 3 3  Change in appetite 0 0  Feeling bad or failure about yourself  0 0  Trouble concentrating 0 0  Moving slowly or fidgety/restless 0 0  Suicidal thoughts 0 0  PHQ-9 Score 3 3  Difficult doing work/chores - Not difficult at all    Review of Systems  Constitutional: Negative.   HENT: Negative.   Eyes: Negative.   Respiratory: Negative.   Cardiovascular: Negative.   Gastrointestinal: Negative.   Endocrine: Negative.   Genitourinary: Negative.   Musculoskeletal: Positive for gait problem.  Skin: Negative.   Allergic/Immunologic: Negative.   Hematological: Negative.   Psychiatric/Behavioral: Negative.   All other systems reviewed and are negative.      Objective:   Physical Exam  General: No acute distress HEENT: EOMI, oral membranes moist Cards: reg rate  Chest: normal effort Abdomen: Soft, NT, ND Skin: dry, intact Extremities: no edema  Neuro: dysarthric, wet voice, sensory loss right arm and legs.    .  Still somewhat ataxic with LUE and LLE although controlled when ambulating with walker.  Slight steppage gait pattern cognitively intact. Psych: Pt's affect is appropriate. Pt is cooperative Musculoskeletal: Dupuytren's contractures of the left fourth and third fingers.  Area is somewhat tender to touch      Assessment & Plan:  1.Recent left lateral medullary infarct with balance deficits  and severe oropharyngeal dysphagia             -beginning outpt therapies              -Continue e-stim for dysphagia                         -continue NPO             -Discussed other options for tube feed formulas which may satiate him more..  2 DM: Controlled with current meds 3. OSA on CPAP: PCP Following.  3 Pain: maintain gabapentin for neuropathic pain.   4. Fatigue             -  optimize sleep             -using CPAP                      -avoid naps during the day.             -ordered lab panel for fatigue     -consider ritalin trial 5.  Mild Dupuytren's contractures of the left third and fourth fingers             -Home exercise program was provided  50 minutes of direct patient time was spent in the office today.  I will see him back in about 3 months.

## 2019-11-01 NOTE — Patient Instructions (Signed)
PLEASE FEEL FREE TO CALL OUR OFFICE WITH ANY PROBLEMS OR QUESTIONS ([phone])      

## 2019-11-03 LAB — T4, FREE: Free T4: 1 ng/dL (ref 0.82–1.77)

## 2019-11-03 LAB — CBC
Hematocrit: 38.6 % (ref 37.5–51.0)
Hemoglobin: 12.9 g/dL — ABNORMAL LOW (ref 13.0–17.7)
MCH: 30.6 pg (ref 26.6–33.0)
MCHC: 33.4 g/dL (ref 31.5–35.7)
MCV: 92 fL (ref 79–97)
Platelets: 204 10*3/uL (ref 150–450)
RBC: 4.22 x10E6/uL (ref 4.14–5.80)
RDW: 12.8 % (ref 11.6–15.4)
WBC: 5.9 10*3/uL (ref 3.4–10.8)

## 2019-11-03 LAB — COMPREHENSIVE METABOLIC PANEL
ALT: 25 IU/L (ref 0–44)
AST: 20 IU/L (ref 0–40)
Albumin/Globulin Ratio: 1.2 (ref 1.2–2.2)
Albumin: 3.8 g/dL (ref 3.8–4.8)
Alkaline Phosphatase: 95 IU/L (ref 39–117)
BUN/Creatinine Ratio: 25 — ABNORMAL HIGH (ref 10–24)
BUN: 17 mg/dL (ref 8–27)
Bilirubin Total: 0.3 mg/dL (ref 0.0–1.2)
CO2: 25 mmol/L (ref 20–29)
Calcium: 9.1 mg/dL (ref 8.6–10.2)
Chloride: 101 mmol/L (ref 96–106)
Creatinine, Ser: 0.69 mg/dL — ABNORMAL LOW (ref 0.76–1.27)
GFR calc Af Amer: 118 mL/min/{1.73_m2} (ref >59)
GFR calc non Af Amer: 102 mL/min/{1.73_m2} (ref >59)
Globulin, Total: 3.1 g/dL (ref 1.5–4.5)
Glucose: 116 mg/dL — ABNORMAL HIGH (ref 65–99)
Potassium: 4.5 mmol/L (ref 3.5–5.2)
Sodium: 138 mmol/L (ref 134–144)
Total Protein: 6.9 g/dL (ref 6.0–8.5)

## 2019-11-03 LAB — TESTOSTERONE: Testosterone: 278 ng/dL (ref 264–916)

## 2019-11-03 LAB — TSH: TSH: 1.2 u[IU]/mL (ref 0.450–4.500)

## 2019-11-03 LAB — T4: T4, Total: 6.6 ug/dL (ref 4.5–12.0)

## 2019-11-03 LAB — TESTOSTERONE, FREE: Testosterone, Free: 3.6 pg/mL — ABNORMAL LOW (ref 6.6–18.1)

## 2019-11-05 LAB — METHYLMALONIC ACID, SERUM: Methylmalonic Acid: 119 nmol/L (ref 0–378)

## 2019-11-05 LAB — B12 AND FOLATE PANEL
Folate: 7 ng/mL (ref >3.0)
Vitamin B-12: 918 pg/mL (ref 232–1245)

## 2019-11-17 ENCOUNTER — Other Ambulatory Visit: Payer: Self-pay | Admitting: Primary Care

## 2019-11-17 DIAGNOSIS — R6889 Other general symptoms and signs: Secondary | ICD-10-CM

## 2019-11-20 ENCOUNTER — Ambulatory Visit: Payer: BC Managed Care – PPO | Admitting: Physical Therapy

## 2019-11-20 ENCOUNTER — Ambulatory Visit: Payer: BC Managed Care – PPO | Attending: Physical Medicine & Rehabilitation | Admitting: Occupational Therapy

## 2019-11-20 ENCOUNTER — Ambulatory Visit: Payer: BC Managed Care – PPO

## 2019-11-20 ENCOUNTER — Other Ambulatory Visit: Payer: Self-pay

## 2019-11-20 DIAGNOSIS — M6281 Muscle weakness (generalized): Secondary | ICD-10-CM | POA: Insufficient documentation

## 2019-11-20 DIAGNOSIS — R1312 Dysphagia, oropharyngeal phase: Secondary | ICD-10-CM | POA: Insufficient documentation

## 2019-11-20 DIAGNOSIS — R278 Other lack of coordination: Secondary | ICD-10-CM

## 2019-11-20 DIAGNOSIS — R209 Unspecified disturbances of skin sensation: Secondary | ICD-10-CM | POA: Diagnosis present

## 2019-11-20 DIAGNOSIS — R1313 Dysphagia, pharyngeal phase: Secondary | ICD-10-CM | POA: Diagnosis present

## 2019-11-20 DIAGNOSIS — R2681 Unsteadiness on feet: Secondary | ICD-10-CM | POA: Diagnosis present

## 2019-11-20 DIAGNOSIS — R2689 Other abnormalities of gait and mobility: Secondary | ICD-10-CM

## 2019-11-20 NOTE — Patient Instructions (Signed)
  Coordination Activities  Perform the following activities for 10 minutes 1-2 times per day with left hand(s).   Rotate ball in fingertips (clockwise and counter-clockwise x 3 revolutions each way).  Toss ball in air and catch with the same hand.  Flip cards 1 at a time as fast as you can.  Deal cards with your thumb (Hold deck in hand and push card off top with thumb).  Rotate one card in hand (clockwise and counter-clockwise x 5 turns each way).  Pick up coins and place in container or coin bank.  Pick up coins one at a time until you get 5 in your hand, then move coins from palm to fingertips to stack one at a time. Do 3 stacks of 5  Screw together nuts and bolts, then unfasten.

## 2019-11-20 NOTE — Patient Instructions (Signed)
Access Code: [email or web address] URL: [email or web address] Date: 11/20/2019 Prepared by: Lonia Blood  Exercises Sit to Stand - 10 reps - 1 sets - 1-2x daily - 5x weekly Seated Toe Raise - 10 reps - 2 sets - 3 sec hold - 1x daily - 7x weekly Quarter Squat with Table - 10 reps - 1 sets - 1x daily - 5x weekly

## 2019-11-20 NOTE — Therapy (Signed)
[facility] [address] [employer] [place], [place], [address] Phone: [phone]   Fax:  [phone]  Occupational Therapy Treatment  Patient Details  Name: Michael Bright MRN: [id number] Date of Birth: [birth year] Referring Provider (OT): Dr. Naaman Plummer   Encounter Date: 11/20/2019  OT End of Session - 11/20/19 1344    Visit Number  2    Number of Visits  25    Date for OT Re-Evaluation  01/24/20    Authorization Type  [place]    Authorization - Visit Number  2    Authorization - Number of Visits  10    OT Start Time  0814    OT Stop Time  1400    OT Time Calculation (min)  43 min    Activity Tolerance  Patient tolerated treatment well    Behavior During Therapy  [facility] for tasks assessed/performed       Past Medical History:  Diagnosis Date  . Acute CVA (cerebrovascular accident) ([place]) 05/30/2019  . Acute kidney injury ([place])   . Acute respiratory failure ([place])   . Diabetes ([place])   . Pneumonia    aspiration pneumonia  . Septic shock ([place])   . Stroke ([place])   . Urinary tract infection   . Urinary tract infection without hematuria     Past Surgical History:  Procedure Laterality Date  . CHOLECYSTECTOMY    . IR GASTROSTOMY TUBE MOD SED  07/04/2019    There were no vitals filed for this visit.  Subjective Assessment - 11/20/19 1320    Pertinent History  Michael Bright is a 64 y.o. 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 He was readmitted on 07/20/19 with septic shock due to Klebsiella pneumoniae bacteremia with UTI and hypoxic respiratory failure bilateral lung infiltrates due to rhino virus. He was intubated briefly from 10/1-10/2 and treated with  broad spectrum antibiotics till cultures finalized. Antibiotics narrowed to cefdinir on 10/5 with recommendations to complete 2 weeks total antibiotic course.  Hospital course significant for AKI with abnormal LFTs due to shocked liver, intermittent hypoxic episode as well as significant oral secretions with dysphonia. Discharged home 08/08/19.    Limitations  NPO, feeding tube    Patient Stated Goals  improve coordination and walking    Currently in Pain?  Yes    Pain Score  3     Pain Location  Hand    Pain Orientation  Left    Pain Descriptors / Indicators  Stabbing    Pain Type  Chronic pain    Pain Onset  More than a month ago    Pain Frequency  Intermittent    Aggravating Factors   quick movements    Pain Relieving Factors  rubbing it/massage, gloves       Issued coordination HEP - see pt instructions for details. Pt demo each.  Pt placing and retrieving clothespins (red to blue resistance) on antenna LUE w/ 2 lb wrist weight on for strength/endurance.   UBE x 8 min. Level 1 for UB strength/conditioning                    OT Education - 11/20/19 1341    Education Details  coordination HEP    Person(s) Educated  Patient  Methods  Explanation;Demonstration;Verbal cues;Handout    Comprehension  Verbalized understanding;Returned demonstration;Verbal cues required       OT Short Term Goals - 11/20/19 1346      OT SHORT TERM GOAL #1   Title  I with HEP.    Time  4    Period  Weeks    Status  On-going    Target Date  11/25/19      OT SHORT TERM GOAL #2   Title  Pt will demonstrate improved fine motor coordination for ADLs as evidenced by decreasing 9 hole peg test score to 50 secs or less    Baseline  RUE 35.41, LUE 57.78    Time  4    Period  Weeks    Status  On-going      OT SHORT TERM GOAL #3   Title  Pt will demonstrate aiblity to retrievee light weight object at 110* shoulder flexion with LUE.    Time  4    Period  Weeks    Status  On-going       OT SHORT TERM GOAL #4   Title  Pt will perfrom light home management/ basic cooking with supervision.    Time  4    Period  Weeks    Status  New        OT Long Term Goals - 10/26/19 1645      OT LONG TERM GOAL #1   Title  I with updated HEP.    Time  12    Period  Weeks    Status  New    Target Date  01/24/20      OT LONG TERM GOAL #2   Title  Pt will demonstrate improved LUE fine motor coordination for ADLS as evidenced by decreasing 9 hole peg test score to 42 secs or less.    Time  12    Period  Weeks    Status  New      OT LONG TERM GOAL #3   Title  Pt will retrieve a lightweight object at 120 shoulder flexion with LUE.    Time  12    Period  Weeks    Status  New      OT LONG TERM GOAL #4   Title  Pt will perform mod complex cooking/ home management modified independently    Time  12    Period  Weeks    Status  New      OT LONG TERM GOAL #5   Title  Pt will perfrom all basic ADLS modified independently.    Time  12    Period  Weeks    Status  New            Plan - 11/20/19 1347    Clinical Impression Statement  Pt progressing with LUE function since last seen for evaluation. Pt progressing towards STG's #1-3.    Occupational performance deficits (Please refer to evaluation for details):  ADL's;IADL's;Leisure;Social Participation    Body Structure / Function / Physical Skills  ADL;UE functional use;Flexibility;Pain;FMC;ROM;Coordination;GMC;Sensation;Decreased knowledge of precautions;Decreased knowledge of use of DME;IADL;Dexterity;Strength;Mobility;Endurance;Balance;Vision    Rehab Potential  Good    OT Frequency  2x / week    OT Duration  12 weeks    OT Treatment/Interventions  Self-care/ADL training;Aquatic Therapy;DME and/or AE instruction;Patient/family education;Balance training;Passive range of motion;Gait Training;Paraffin;Cryotherapy;Fluidtherapy;Splinting;Building services engineer;Contrast Bath;Electrical Stimulation;Moist Heat;Therapeutic  exercise;Manual Therapy;Therapeutic activities;Neuromuscular education    Plan  review coordination HEP prn, initiate LUE HEP,  continue progress towards STG's    Consulted and Agree with Plan of Care  Patient       Patient will benefit from skilled therapeutic intervention in order to improve the following deficits and impairments:   Body Structure / Function / Physical Skills: ADL, UE functional use, Flexibility, Pain, FMC, ROM, Coordination, GMC, Sensation, Decreased knowledge of precautions, Decreased knowledge of use of DME, IADL, Dexterity, Strength, Mobility, Endurance, Balance, Vision       Visit Diagnosis: Other lack of coordination  Muscle weakness (generalized)    Problem List Patient Active Problem List   Diagnosis Date Noted  . 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 Dupuytren's contracture of left hand 09/20/2019  . Hyperlipidemia 08/24/2019  . Upper and lower extremity pain 08/24/2019  . 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 (CVA) 08/09/2019  . OSA on CPAP 08/09/2019  . Copious oral secretions 08/09/2019  . Anemia of chronic disease   . Sepsis with acute renal failure without septic shock (H[facility])   . Dysphagia due to recent cerebral infarction   . Diabetes mellitus type 2 in obese (HCC)   . Morbid obesity (HCC)   . Status post insertion of percutaneous endoscopic gastrostomy (PEG) tube (H[facility])   . History of CVA (cerebrovascular accident) 06/27/2019  . Aspiration pneumonia (HCC) 06/14/2019  . Essential hypertension 06/14/2019  . 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 Renal insufficiency 05/30/2019  . Diplopia   . Chronic fatigue     Kelli Churn, OTR/L 11/20/2019, 1:52 PM  [facility] [address] [place], [place], [address] Phone: [phone]   Fax:  [phone]  Name: Michael Bright MRN: [id number] Date of  Birth: 1955-11-30

## 2019-11-20 NOTE — Therapy (Signed)
[facility] [address] [place], [place], [address] Phone: [phone]   Fax:  [phone]  Physical Therapy Treatment  Patient Details  Name: Michael Bright MRN: [id number] Date of Birth: [birth year] Referring Provider (PT): Faith Rogue, MD   Encounter Date: 11/20/2019  PT End of Session - 11/20/19 1710    Visit Number  2    Number of Visits  25    Date for PT Re-Evaluation  01/29/20    Authorization Type  BCBS - 2021 primary, Medicare Secondary    PT Start Time  1404    PT Stop Time  1444    PT Time Calculation (min)  40 min    Equipment Utilized During Treatment  Gait belt    Activity Tolerance  Patient tolerated treatment well    Behavior During Therapy  [facility] for tasks assessed/performed       Past Medical History:  Diagnosis Date  . Acute CVA (cerebrovascular accident) (HCC) 05/30/2019  . Acute kidney injury (HCC)   . Acute respiratory failure (HCC)   . Diabetes (HCC)   . Pneumonia    aspiration pneumonia  . Septic shock (HCC)   . Stroke (HCC)   . Urinary tract infection   . Urinary tract infection without hematuria     Past Surgical History:  Procedure Laterality Date  . CHOLECYSTECTOMY    . IR GASTROSTOMY TUBE MOD SED  07/04/2019    There were no vitals filed for this visit.  Subjective Assessment - 11/20/19 1404    Subjective  No c/o, no changes since last visit (eval on 10/31/2019).  Reports mostly doing standing exercises at his locked rollator at home.    Pertinent History  PMH: CVA (L lateral medullary syndrome) diabetes, HTN, OSA, morbid obesity.    Limitations  Standing;Walking;House hold activities    How long can you stand comfortably?  can stand approx. 10 minutes    How long can you walk comfortably?  20 minutes with the rollator    Patient Stated Goals  wants to walk better, walk more steady, wants to be able to get up more easily.    Currently in Pain?  Yes    Pain Score  3      Pain Location  Hand    Pain Orientation  Left    Pain Descriptors / Indicators  Stabbing    Pain Type  Chronic pain    Pain Onset  More than a month ago    Pain Frequency  Intermittent    Aggravating Factors   quick movements    Pain Relieving Factors  rubbing it/massage, gloves    Multiple Pain Sites  Yes    Pain Score  4    Pain Location  Neck    Pain Orientation  Mid    Pain Descriptors / Indicators  Aching    Pain Type  Chronic pain    Pain Onset  More than a month ago    Pain Frequency  Intermittent    Aggravating Factors   maybe the way he is sleeping    Pain Relieving Factors  using the arm bike has helped loosen him up                       [facility] Adult PT Treatment/Exercise - 11/20/19 0001      Transfers   Transfers  Sit to Stand;Stand to Sit    Sit to Stand  4: Min guard;5:  Supervision;With upper extremity assist;From bed    Sit to Stand Details (indicate cue type and reason)  Cues to push from mat, keep rollator locked until standing    Stand to Sit  4: Min guard;5: Supervision;With upper extremity assist;To bed    Transfer Cueing  Cues for transfer training positioning; worked on sit<>stand from 22" and 24" surfaces, with cues for increased forward lean, equal weightbearing and full upgiht posture; cues for squat to slowly sit at elevated mat.  Used elevated mat surface (simulating pt's raised bed at home) for improved ease of transfer training.      Standardized Balance Assessment   Standardized Balance Assessment  Berg Balance Test      Berg Balance Test   Sit to Stand  Able to stand  independently using hands    Standing Unsupported  Able to stand 2 minutes with supervision    Sitting with Back Unsupported but Feet Supported on Floor or Stool  Able to sit safely and securely 2 minutes    Stand to Sit  Controls descent by using hands    Transfers  Able to transfer with verbal cueing and /or supervision    Standing Unsupported with Eyes Closed  Able  to stand 10 seconds with supervision    Standing Ubsupported with Feet Together  Able to place feet together independently and stand for 1 minute with supervision    From Standing, Reach Forward with Outstretched Arm  Reaches forward but needs supervision   5"   From Standing Position, Pick up Object from Floor  Unable to try/needs assist to keep balance    From Standing Position, Turn to Look Behind Over each Shoulder  Needs supervision when turning    Turn 360 Degrees  Needs assistance while turning   hand on gait belt for full turn   Standing Unsupported, Alternately Place Feet on Step/Stool  Needs assistance to keep from falling or unable to try    Standing Unsupported, One Foot in Front  Able to take small step independently and hold 30 seconds    Standing on One Leg  Unable to try or needs assist to prevent fall    Total Score  25      Exercises   Exercises  Knee/Hip;Ankle      Knee/Hip Exercises: Standing   Knee Flexion  Strengthening;Right;Left;2 sets;5 reps    Hip Flexion  Right;Left;2 sets;5 reps;Knee bent    Hip Flexion Limitations  Cues for slowed pace     Hip Abduction  Right;Left;2 sets;5 reps    Abduction Limitations  Cues for slowed pace, 3 sec hold    Hip Extension  Right;Left;2 sets;5 reps;Knee bent    Extension Limitations  Cues for slowed pace, hold for 3 secon    Functional Squat  1 set;10 reps   to 24" mat surface     Ankle Exercises: Seated   Toe Raise  10 reps;3 seconds   2 sets         Balance Exercises - 11/20/19 1435      Balance Exercises: Standing   Tandem Stance  Eyes open;Upper extremity support 2;2 reps;10 secs    SLS  Eyes open;2 reps;10 secs;Upper extremity support 2        PT Education - 11/20/19 1709    Education Details  Berg score and fall risk; review HEP and updates provided-see instructions    Person(s) Educated  Patient    Methods  Explanation;Demonstration;Handout    Comprehension  Verbalized  understanding;Returned  demonstration       PT Short Term Goals - 11/20/19 1710      PT SHORT TERM GOAL #1   Title  Patient will be independent with initial HEP in order to build upon functional gains made in therapy.  ALL STGS DUE 12/13/19    Time  6   due to initial delay in scheduling   Period  Weeks    Target Date  12/13/19      PT SHORT TERM GOAL #2   Title  Patient to improve BERG balance test to at least 32/56 for decreased fall risk.    Time  6    Period  Weeks    Status  Revised      PT SHORT TERM GOAL #3   Title  Patient will decrease TUG time with bariatric rollator to 25 seconds of less to demo decr fall risk.    Baseline  30.22 seconds on 10/31/19    Time  6    Period  Weeks    Status  New      PT SHORT TERM GOAL #4   Title  Patient will improve gait velocity with bariatric rollator to at least 2.1 ft/sec to demo a limited community ambulator and decr fall risk.    Baseline  1.70 ft/sec    Time  6    Period  Weeks    Status  New      PT SHORT TERM GOAL #5   Title  Patient will decrease 5x sit <> stand time to 25 seconds of less from elevated mat table with UE support in order to demo improved functional LE strength.    Baseline  31.06 seconds    Time  6    Period  Weeks    Status  New        PT Long Term Goals - 11/20/19 1711      PT LONG TERM GOAL #1   Title  Patient will be independent with final HEP in order to build upon functional gains made in therapy. ALL LTGS DUE 01/29/20    Time  13   due to delay in scheduling   Period  Weeks    Status  New      PT LONG TERM GOAL #2   Title  Berg score to imporve to at least 40/56 for decreased fall risk.    Time  13    Period  Weeks    Status  Revised      PT LONG TERM GOAL #3   Title  Patient will decrease 5x sit <> stand time to 19 seconds of less from elevated mat table vs. standard chair with UE support in order to demo improved functional LE strength.    Time  13    Period  Weeks    Status  New      PT LONG TERM GOAL  #4   Title  Patient will improve gait velocity with bariatric rollator vs. LRAD to at least 2.6 ft/sec to demo improved community mobility.    Time  13    Period  Weeks    Status  New      PT LONG TERM GOAL #5   Title  Patient will decrease TUG time with bariatric rollator vs. LRAD to 19 seconds of less to demo decr fall risk.    Time  13    Period  Weeks    Status  New  PT LONG TERM GOAL #6   Title  Patient will ambulate at least 300' with supervision over level indoor and outdoor surfaces with LRAD  with supervision in order to improve community mobility.    Time  13    Period  Weeks    Status  New            Plan - 11/20/19 1711    Clinical Impression Statement  Berg score assessed today, with pt scoring 25/56, indicative of significant fall risk and need to continue using rollator (pt verbalizes understanding).  Reviewed pt's HEP from [place], which is mostly standing at locked rollator (versus counter, to account for pt's height).  Updated and added to pt's HEP to include ankle exercises as well as sit<>stand and functional squats.  Pt will continue to benefit from skilled PT to address strength, balance,a nd gait training for improved overall functional mobility and independence.    Personal Factors and Comorbidities  Comorbidity 3+;Past/Current Experience;Time since onset of injury/illness/exacerbation    Comorbidities  diabetes, HTN, OSA, morbid obesity.    Examination-Activity Limitations  Squat;Transfers;Locomotion Level;Stairs    Examination-Participation Restrictions  Community Activity    Stability/Clinical Decision Making  Evolving/Moderate complexity    Rehab Potential  Good    PT Frequency  2x / week    PT Duration  12 weeks    PT Treatment/Interventions  ADLs/Self Care Home Management;Balance training;Therapeutic exercise;Therapeutic activities;Functional mobility training;Stair training;Gait training;DME Instruction;Aquatic Therapy;Neuromuscular  re-education;Patient/family education;Orthotic Fit/Training;Energy conservation    PT Next Visit Plan  Review HEP ; continue sit<>stand from elevated surfaces, step ups, single limb stance and functional lower extremity strengthening; work in parallel bars to decrease UE support    PT Home Exercise Plan  [email or web address] ([place])    Consulted and Agree with Plan of Care  Patient       Patient will benefit from skilled therapeutic intervention in order to improve the following deficits and impairments:  Abnormal gait, Decreased activity tolerance, Decreased balance, Decreased coordination, Decreased range of motion, Decreased knowledge of use of DME, Decreased knowledge of precautions, Decreased endurance, Decreased strength, Difficulty walking, Postural dysfunction, Impaired sensation, Obesity, Pain  Visit Diagnosis: Unsteadiness on feet  Muscle weakness (generalized)  Other abnormalities of gait and mobility     Problem List Patient Active Problem List   Diagnosis Date Noted  . 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 Dupuytren's contracture of left hand 09/20/2019  . Hyperlipidemia 08/24/2019  . Upper and lower extremity pain 08/24/2019  . 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 (CVA) 08/09/2019  . OSA on CPAP 08/09/2019  . Copious oral secretions 08/09/2019  . Anemia of chronic disease   . Sepsis with acute renal failure without septic shock ([place])   . Dysphagia due to recent cerebral infarction   . Diabetes mellitus type 2 in obese ([place])   . Morbid obesity ([place])   . Status post insertion of percutaneous endoscopic gastrostomy (PEG) tube ([place])   . History of CVA (cerebrovascular accident) 06/27/2019  . Aspiration pneumonia ([place]) 06/14/2019  . Essential hypertension 06/14/2019  . 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 Renal insufficiency 05/30/2019  . Diplopia   . Chronic fatigue     Trease Bremner  W. 11/20/2019, 5:16 PM  Frazier Butt., PT   [facility] [address] [place], [place], [address] Phone: [phone]   Fax:  [phone]  Name: Michael Bright MRN: [id number] Date of Birth:  10/05/1956   

## 2019-11-20 NOTE — Therapy (Signed)
[facility] [address] [place], [place], [address] Phone: [phone]   Fax:  [phone]  Speech Language Pathology Treatment  Patient Details  Name: Michael Bright MRN: [id number] Date of Birth: 1956-06-14 Referring Provider (SLP): Dr. Naaman Bright   Encounter Date: 11/20/2019  End of Session - 11/20/19 1721    Visit Number  2    Number of Visits  17    Date for SLP Re-Evaluation  12/25/19    Authorization Type  BCBS Primary, Medicare secondary    SLP Start Time  1450    SLP Stop Time   1531    SLP Time Calculation (min)  41 min    Activity Tolerance  Patient tolerated treatment well       Past Medical History:  Diagnosis Date  . Acute CVA (cerebrovascular accident) ([place]) 05/30/2019  . Acute kidney injury ([place])   . Acute respiratory failure ([place])   . Diabetes ([place])   . Pneumonia    aspiration pneumonia  . Septic shock ([place])   . Stroke ([place])   . Urinary tract infection   . Urinary tract infection without hematuria     Past Surgical History:  Procedure Laterality Date  . CHOLECYSTECTOMY    . IR GASTROSTOMY TUBE MOD SED  07/04/2019    There were no vitals filed for this visit.  Subjective Assessment - 11/20/19 1651    Subjective  Pt enters with clear voice.    Currently in Pain?  Yes    Pain Score  3     Pain Location  Hand    Pain Orientation  Left    Pain Descriptors / Indicators  Stabbing    Pain Type  Chronic pain    Pain Onset  More than a month ago            ADULT SLP TREATMENT - 11/20/19 1652      General Information   Behavior/Cognition  Alert;Pleasant mood;Cooperative      Treatment Provided   Treatment provided  Dysphagia      Dysphagia Treatment   Temperature Spikes Noted  No    Treatment Methods  Skilled observation;Compensation strategy training;Patient/caregiver education    Patient observed directly with PO's  Yes    Type of PO's observed  Nectar-thick liquids    Liquids  provided via  Teaspoon   pt-administered   Pharyngeal Phase Signs & Symptoms  Complaints of residue    Other treatment/comments  Pt has remained on 6 cartons/cans of tube feed since previous appointment. He states he is "holding a little basketball under (his) chin" for what he assumes is about 45 seconds whenever he sees the basketball, and also doing tongue base retraction exercises at home, 3 times a day with unknown reps. Additionally he is performing swallows with thin liquids and ice chips - SLP ensured pt is performing proper oral care prior to POs at home. No overt s/sx aspiration PNA today nor any reported. As pt has performed stringent oral care >30 minutes ago, SLP worked with teaspoons nectaar. Pt without overt s/sx aspiration however after approx 6 teaspoons nectar H2O he reported nectar "getting hung" in thyroid area, not gravitating towards one side more than the other, clearing with throat clear and additional swallows. Pt independent in double swallows, throat clear and additional swallows after intitial education to do so today. Pt educated on what a "wet" voice meant today, even though pt did not exhibit during the session. SLP encouraged pt  to cont with EMST device also x3/day. Pt agreed neuromuscular stimulation could occur after electrodes are obtained.       Assessment / Recommendations / Plan   Plan  Continue with current plan of care      Progression Toward Goals   Progression toward goals  Progressing toward goals       SLP Education - 11/20/19 1719    Education Details  thorough oral care must be done <30 minutes prior to taking ice chips or thin liquids, "Wet" voice explanation    Person(s) Educated  Patient    Methods  Explanation    Comprehension  Verbalized understanding;Need further instruction       SLP Short Term Goals - 11/20/19 1722      SLP SHORT TERM GOAL #1   Title  Pt will complete dysphagia HEP with rare min A x 3 sessions.    Time  4    Period   Weeks    Status  On-going      SLP SHORT TERM GOAL #2   Title  Patient will tolerate electrode placement for NMES x 3 sessions.    Time  4    Period  Weeks    Status  On-going      SLP SHORT TERM GOAL #3   Title  Patient will tell SLP 3 s/sx of aspiration PNA.    Time  4    Period  Weeks    Status  On-going       SLP Long Term Goals - 11/20/19 1723      SLP LONG TERM GOAL #1   Title  Pt will demonstrate dysphagia HEP with modified independence x3 sessions.    Time  8    Period  Weeks    Status  On-going      SLP LONG TERM GOAL #2   Title  Pt will report thorough oral care prior to water/ice chips x6 sessions.    Time  8    Period  Weeks    Status  On-going       Plan - 11/20/19 1722    Clinical Impression Statement  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 He has been PEG dependent since hospitalization. Worked with CIR and until recently [facility] SLP; has been working on teaspoon trials of nectar with SLP only since repeat MBS. Has also been taking sips of regular water and ice chips after oral care for approximately 1 month. He had aspiration PNA in October (post seizure/vomiting), but not since. Patient has been completing pharyngeal exercises and RMT (expiratory) with improvement in function noted on most recent MBS, however AMN still recommended due to pharyngeal residue and aspiration risk. I recommend cont'd skilled ST to address dysphagia for training in aspiration precautions, pharyngeal exercises, and possibly NMES to improve pt's swallow function for possible advancement to comfort feeds. Advise nectar thick liquid trials with SLP only at this time; continue free water protocol at home after rigorous oral care.    Speech Therapy Frequency  2x / week    Duration  --   8 weeks or 17 visits   Treatment/Interventions  Aspiration precaution training;Cueing hierarchy;SLP instruction and feedback;Pharyngeal  strengthening exercises;NMES;Compensatory techniques;Diet toleration management by SLP;Trials of upgraded texture/liquids;Patient/family education    Potential to Achieve Goals  Good    Potential Considerations  Severity of impairments    SLP Home Exercise Plan  Continue swallow ex and RMT  per [facility] SLP; bring exercises to next session    Consulted and Agree with Plan of Care  Patient;Family member/caregiver       Patient will benefit from skilled therapeutic intervention in order to improve the following deficits and impairments:   Dysphagia, pharyngeal phase    Problem List Patient Active Problem List   Diagnosis Date Noted  . 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 Dupuytren's contracture of left hand 09/20/2019  . Hyperlipidemia 08/24/2019  . Upper and lower extremity pain 08/24/2019  . 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 (CVA) 08/09/2019  . OSA on CPAP 08/09/2019  . Copious oral secretions 08/09/2019  . Anemia of chronic disease   . Sepsis with acute renal failure without septic shock (HCC)   . Dysphagia due to recent cerebral infarction   . Diabetes mellitus type 2 in obese (HCC)   . Morbid obesity (HCC)   . Status post insertion of percutaneous endoscopic gastrostomy (PEG) tube (H[facility])   . History of CVA (cerebrovascular accident) 06/27/2019  . Aspiration pneumonia ([facility]) 06/14/2019  . Essential hypertension 06/14/2019  . 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 Renal insufficiency 05/30/2019  . Diplopia   . Chronic fatigue     Yasmyn Bellisario ,MS, CCC-SLP  11/20/2019, 5:23 PM  [facility] [address] [place], [place], [address] Phone: [phone]   Fax:  [phone]   Name: Michael Bright MRN: [id number] Date of Birth: 09/18/1956

## 2019-11-22 ENCOUNTER — Encounter: Payer: Self-pay | Admitting: Occupational Therapy

## 2019-11-22 ENCOUNTER — Ambulatory Visit: Payer: BC Managed Care – PPO | Admitting: Occupational Therapy

## 2019-11-22 ENCOUNTER — Ambulatory Visit: Payer: BC Managed Care – PPO

## 2019-11-22 ENCOUNTER — Ambulatory Visit: Payer: BC Managed Care – PPO | Admitting: Physical Therapy

## 2019-11-22 ENCOUNTER — Other Ambulatory Visit: Payer: Self-pay

## 2019-11-22 ENCOUNTER — Telehealth: Payer: Self-pay | Admitting: Adult Health

## 2019-11-22 DIAGNOSIS — M6281 Muscle weakness (generalized): Secondary | ICD-10-CM

## 2019-11-22 DIAGNOSIS — R208 Other disturbances of skin sensation: Secondary | ICD-10-CM

## 2019-11-22 DIAGNOSIS — R2689 Other abnormalities of gait and mobility: Secondary | ICD-10-CM

## 2019-11-22 DIAGNOSIS — R278 Other lack of coordination: Secondary | ICD-10-CM | POA: Diagnosis not present

## 2019-11-22 DIAGNOSIS — R2681 Unsteadiness on feet: Secondary | ICD-10-CM

## 2019-11-22 DIAGNOSIS — R1313 Dysphagia, pharyngeal phase: Secondary | ICD-10-CM

## 2019-11-22 NOTE — Therapy (Signed)
[facility] [address] [place], [place], [address] Phone: [phone]   Fax:  [phone]  Occupational Therapy Treatment  Patient Details  Name: Michael Bright MRN: [id number] Date of Birth: 05-19-1956 Referring Provider (OT): Dr. Riley Kill   Encounter Date: 11/22/2019  OT End of Session - 11/22/19 1620    Visit Number  3    Number of Visits  25    Date for OT Re-Evaluation  01/24/20    Authorization Type  [employer]    Authorization - Visit Number  3    Authorization - Number of Visits  10    OT Start Time  1620    OT Stop Time  1700    OT Time Calculation (min)  40 min    Activity Tolerance  Patient tolerated treatment well    Behavior During Therapy  [facility] for tasks assessed/performed       Past Medical History:  Diagnosis Date  . Acute CVA (cerebrovascular accident) (H[facility]) 05/30/2019  . Acute kidney injury (HCC)   . Acute respiratory failure (HCC)   . Diabetes (HCC)   . Pneumonia    aspiration pneumonia  . Septic shock (H[facility])   . Stroke (HCC)   . Urinary tract infection   . Urinary tract infection without hematuria     Past Surgical History:  Procedure Laterality Date  . CHOLECYSTECTOMY    . IR GASTROSTOMY TUBE MOD SED  07/04/2019    There were no vitals filed for this visit.  Subjective Assessment - 11/22/19 1641    Subjective   Denies pain    Pertinent History  Michael Bright is a 64 y.o. 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 He was readmitted on 07/20/19 with septic shock due to Klebsiella pneumoniae bacteremia with UTI and hypoxic respiratory failure bilateral lung infiltrates due to rhino virus. He was intubated briefly from  10/1-10/2 and treated with broad spectrum antibiotics till cultures finalized. Antibiotics narrowed to cefdinir on 10/5 with recommendations to complete 2 weeks total antibiotic course.  Hospital course significant for AKI with abnormal LFTs due to shocked liver, intermittent hypoxic episode as well as significant oral secretions with dysphonia. Discharged home 08/08/19.    Limitations  NPO, feeding tube    Patient Stated Goals  improve coordination and walking    Currently in Pain?  No/denies           Treatment: seated low range functional reach with fine motor coordination to copy small peg design with LUE, min-mod difficulty/ drops. UBE x 5 mins level 3 for conditioning               OT Education - 11/22/19 1642    Education Details  ball ex in supine and seated, min v.c to avoid compensation 10-20 reps each    Person(s) Educated  Patient    Methods  Explanation;Demonstration;Verbal cues;Handout    Comprehension  Verbalized understanding;Returned demonstration;Verbal cues required       OT Short Term Goals - 11/20/19 1346      OT SHORT TERM GOAL #1   Title  I with HEP.    Time  4    Period  Weeks    Status  On-going    Target Date  11/25/19  OT SHORT TERM GOAL #2   Title  Pt will demonstrate improved fine motor coordination for ADLs as evidenced by decreasing 9 hole peg test score to 50 secs or less    Baseline  RUE 35.41, LUE 57.78    Time  4    Period  Weeks    Status  On-going      OT SHORT TERM GOAL #3   Title  Pt will demonstrate aiblity to retrievee light weight object at 110* shoulder flexion with LUE.    Time  4    Period  Weeks    Status  On-going      OT SHORT TERM GOAL #4   Title  Pt will perfrom light home management/ basic cooking with supervision.    Time  4    Period  Weeks    Status  New        OT Long Term Goals - 10/26/19 1645      OT LONG TERM GOAL #1   Title  I with updated HEP.    Time  12    Period  Weeks    Status   New    Target Date  01/24/20      OT LONG TERM GOAL #2   Title  Pt will demonstrate improved LUE fine motor coordination for ADLS as evidenced by decreasing 9 hole peg test score to 42 secs or less.    Time  12    Period  Weeks    Status  New      OT LONG TERM GOAL #3   Title  Pt will retrieve a lightweight object at 120 shoulder flexion with LUE.    Time  12    Period  Weeks    Status  New      OT LONG TERM GOAL #4   Title  Pt will perform mod complex cooking/ home management modified independently    Time  12    Period  Weeks    Status  New      OT LONG TERM GOAL #5   Title  Pt will perfrom all basic ADLS modified independently.    Time  12    Period  Weeks    Status  New            Plan - 11/22/19 1638    Clinical Impression Statement  Pt is progressing towards goals with improving fine motor coordination.    Occupational performance deficits (Please refer to evaluation for details):  ADL's;IADL's;Leisure;Social Participation    Body Structure / Function / Physical Skills  ADL;UE functional use;Flexibility;Pain;FMC;ROM;Coordination;GMC;Sensation;Decreased knowledge of precautions;Decreased knowledge of use of DME;IADL;Dexterity;Strength;Mobility;Endurance;Balance;Vision    Rehab Potential  Good    OT Frequency  2x / week    OT Duration  12 weeks    OT Treatment/Interventions  Self-care/ADL training;Aquatic Therapy;DME and/or AE instruction;Patient/family education;Balance training;Passive range of motion;Gait Training;Paraffin;Cryotherapy;Fluidtherapy;Splinting;Building services engineer;Contrast Bath;Electrical Stimulation;Moist Heat;Therapeutic exercise;Manual Therapy;Therapeutic activities;Neuromuscular education    Plan  low range functional reach and coordination activities.    Consulted and Agree with Plan of Care  Patient       Patient will benefit from skilled therapeutic intervention in order to improve the following deficits and impairments:   Body  Structure / Function / Physical Skills: ADL, UE functional use, Flexibility, Pain, FMC, ROM, Coordination, GMC, Sensation, Decreased knowledge of precautions, Decreased knowledge of use of DME, IADL, Dexterity, Strength, Mobility, Endurance, Balance, Vision       Visit Diagnosis:  Muscle weakness (generalized)  Other abnormalities of gait and mobility  Other lack of coordination  Other disturbances of skin sensation    Problem List Patient Active Problem List   Diagnosis Date Noted  . 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 Dupuytren's contracture of left hand 09/20/2019  . Hyperlipidemia 08/24/2019  . Upper and lower extremity pain 08/24/2019  . 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 (CVA) 08/09/2019  . OSA on CPAP 08/09/2019  . Copious oral secretions 08/09/2019  . Anemia of chronic disease   . Sepsis with acute renal failure without septic shock ([place])   . Dysphagia due to recent cerebral infarction   . Diabetes mellitus type 2 in obese ([place])   . Morbid obesity ([place])   . Status post insertion of percutaneous endoscopic gastrostomy (PEG) tube ([place])   . History of CVA (cerebrovascular accident) 06/27/2019  . Aspiration pneumonia ([place]) 06/14/2019  . Essential hypertension 06/14/2019  . 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 Renal insufficiency 05/30/2019  . Diplopia   . Chronic fatigue     Michael Bright 11/22/2019, 4:42 PM  [facility] [address] [place], [place], [address] Phone: [phone]   Fax:  [phone]  Name: Michael Bright MRN: [id number] Date of Birth: 1956-08-18

## 2019-11-22 NOTE — Patient Instructions (Signed)
Laying on your back, hold a ball between your hands bring ball over head for a gentle stretch 10 reps 2x day   Seated hold a ball between your hands , raise arms up to shoulder height without elevating shoulder or arching back,then  lower ball back to your lap. Keep elbows straight.   Perform 2 sets of 10 reps 1x day

## 2019-11-22 NOTE — Therapy (Signed)
[facility] [address] [place], [place], [address] Phone: [phone]   Fax:  [phone]  Physical Therapy Treatment  Patient Details  Name: Michael Bright MRN: [id number] Date of Birth: 1956/04/03 Referring Provider (PT): Alger Simons, MD   Encounter Date: 11/22/2019  PT End of Session - 11/22/19 2144    Visit Number  3    Number of Visits  25    Date for PT Re-Evaluation  01/29/20    Authorization Type  BCBS - 2021 primary, Medicare Secondary    PT Start Time  1531    PT Stop Time  1613    PT Time Calculation (min)  42 min    Equipment Utilized During Treatment  Gait belt    Activity Tolerance  Patient tolerated treatment well    Behavior During Therapy  [facility] for tasks assessed/performed       Past Medical History:  Diagnosis Date  . Acute CVA (cerebrovascular accident) ([place]) 05/30/2019  . Acute kidney injury ([place])   . Acute respiratory failure ([place])   . Diabetes ([place])   . Pneumonia    aspiration pneumonia  . Septic shock ([place])   . Stroke ([place])   . Urinary tract infection   . Urinary tract infection without hematuria     Past Surgical History:  Procedure Laterality Date  . CHOLECYSTECTOMY    . IR GASTROSTOMY TUBE MOD SED  07/04/2019    There were no vitals filed for this visit.  Subjective Assessment - 11/22/19 1533    Subjective  Been working on the exercises at home; no other changes.    Pertinent History  PMH: CVA (L lateral medullary syndrome) diabetes, HTN, OSA, morbid obesity.    Limitations  Standing;Walking;House hold activities    How long can you stand comfortably?  can stand approx. 10 minutes    How long can you walk comfortably?  20 minutes with the rollator    Patient Stated Goals  wants to walk better, walk more steady, wants to be able to get up more easily.    Currently in Pain?  No/denies    Pain Onset  More than a month ago    Pain Onset  More than a month ago                        [facility] Adult PT Treatment/Exercise - 11/22/19 0001      Transfers   Transfers  Sit to Stand;Stand to Sit    Sit to Stand  4: Min guard;5: Supervision;With upper extremity assist;From bed    Sit to Stand Details (indicate cue type and reason)  Cues for forward lean, anterior weigthshift to initiate transfer    Stand to Sit  4: Min guard;5: Supervision;With upper extremity assist;To bed      Ambulation/Gait   Ambulation/Gait  Yes    Ambulation/Gait Assistance  5: Supervision;4: Min guard    Ambulation/Gait Assistance Details  Cues provided to increase step length, heelstrike bilaterally; tactile cues for increased weightshift/increased stance time on LLE for improved RLE step length.    Ambulation Distance (Feet)  115 Feet   x 2   Assistive device  Rollator;Other (Comment)    Gait Pattern  Step-through pattern;Decreased stride length;Decreased dorsiflexion - left;Trunk flexed;Poor foot clearance - left;Decreased step length - right;Decreased dorsiflexion - right;Decreased weight shift to left    Ambulation Surface  Level;Indoor    Pre-Gait Activities  Stagger stance forward/back weightshift x  8 reps each position, at walker; slight L knee hyperextension noted    Gait Comments  Pt mentioned he has AFO at home, does not use; PT requests pt bring it into therapy.      Exercises   Exercises  Knee/Hip;Ankle      Knee/Hip Exercises: Standing   Knee Flexion  Strengthening;Right;Left;1 set;10 reps   2# LLE   Hip Flexion  Right;Left;1 set;10 reps;Knee bent   LLE 2#   Hip Abduction  Stengthening;Right;Left;1 set;10 reps;Knee straight   2# LLE   Hip Extension  Stengthening;Right;Left;1 set;10 reps   2# weight on LLE   Other Standing Knee Exercises  Alternating step taps to 6" step x 10, consecutive step taps to 6" step, x 10, BUE support at rails       Review of HEP from last visit: Sit to Stand - 10 reps - 1 sets - 1-2x daily - 5x weekly (performed x 5  reps) Seated Toe Raise - 10 reps - 2 sets - 3 sec hold - 1x daily - 7x weekly (performed 10 reps) Quarter Squat with Table - 10 reps - 1 sets - 1x daily - 5x weekly (performed x 10 reps)  Pt return demo understanding   Balance Exercises - 11/22/19 1548      Balance Exercises: Standing   Standing Eyes Opened  Wide (BOA);Narrow base of support (BOS);Head turns;Solid surface;5 reps   Head nods, 2 sets   Standing Eyes Closed  Wide (BOA);Narrow base of support (BOS);Solid surface;1 rep;10 secs    Tandem Stance  Eyes open;Upper extremity support 2;2 reps;10 secs    Retro Gait  Upper extremity support;3 reps   Forward/back in parallel bars-cues for step length   Sidestepping  Upper extremity support;3 reps   in parallel bars-cues for L foot clearance         PT Short Term Goals - 11/20/19 1710      PT SHORT TERM GOAL #1   Title  Patient will be independent with initial HEP in order to build upon functional gains made in therapy.  ALL STGS DUE 12/13/19    Time  6   due to initial delay in scheduling   Period  Weeks    Target Date  12/13/19      PT SHORT TERM GOAL #2   Title  Patient to improve BERG balance test to at least 32/56 for decreased fall risk.    Time  6    Period  Weeks    Status  Revised      PT SHORT TERM GOAL #3   Title  Patient will decrease TUG time with bariatric rollator to 25 seconds of less to demo decr fall risk.    Baseline  30.22 seconds on 10/31/19    Time  6    Period  Weeks    Status  New      PT SHORT TERM GOAL #4   Title  Patient will improve gait velocity with bariatric rollator to at least 2.1 ft/sec to demo a limited community ambulator and decr fall risk.    Baseline  1.70 ft/sec    Time  6    Period  Weeks    Status  New      PT SHORT TERM GOAL #5   Title  Patient will decrease 5x sit <> stand time to 25 seconds of less from elevated mat table with UE support in order to demo improved functional LE strength.  Baseline  31.06 seconds     Time  6    Period  Weeks    Status  New        PT Long Term Goals - 11/20/19 1711      PT LONG TERM GOAL #1   Title  Patient will be independent with final HEP in order to build upon functional gains made in therapy. ALL LTGS DUE 01/29/20    Time  13   due to delay in scheduling   Period  Weeks    Status  New      PT LONG TERM GOAL #2   Title  Berg score to imporve to at least 40/56 for decreased fall risk.    Time  13    Period  Weeks    Status  Revised      PT LONG TERM GOAL #3   Title  Patient will decrease 5x sit <> stand time to 19 seconds of less from elevated mat table vs. standard chair with UE support in order to demo improved functional LE strength.    Time  13    Period  Weeks    Status  New      PT LONG TERM GOAL #4   Title  Patient will improve gait velocity with bariatric rollator vs. LRAD to at least 2.6 ft/sec to demo improved community mobility.    Time  13    Period  Weeks    Status  New      PT LONG TERM GOAL #5   Title  Patient will decrease TUG time with bariatric rollator vs. LRAD to 19 seconds of less to demo decr fall risk.    Time  13    Period  Weeks    Status  New      PT LONG TERM GOAL #6   Title  Patient will ambulate at least 300' with supervision over level indoor and outdoor surfaces with LRAD  with supervision in order to improve community mobility.    Time  13    Period  Weeks    Status  New            Plan - 11/22/19 2144    Clinical Impression Statement  Skilled PT session today focused on lower extremity strengthening, balance, and gait training.  Pt demonstrates decreased hip stability and strength, needing UE support for all dynamic standing exercises.  Used 2# weigth with standing exercises in parallel bars, on LLE, for strengthening in open chain and for added proprioception in closed chain.    Personal Factors and Comorbidities  Comorbidity 3+;Past/Current Experience;Time since onset of injury/illness/exacerbation     Comorbidities  diabetes, HTN, OSA, morbid obesity.    Examination-Activity Limitations  Squat;Transfers;Locomotion Level;Stairs    Examination-Participation Restrictions  Community Activity    Stability/Clinical Decision Making  Evolving/Moderate complexity    Rehab Potential  Good    PT Frequency  2x / week    PT Duration  12 weeks    PT Treatment/Interventions  ADLs/Self Care Home Management;Balance training;Therapeutic exercise;Therapeutic activities;Functional mobility training;Stair training;Gait training;DME Instruction;Aquatic Therapy;Neuromuscular re-education;Patient/family education;Orthotic Fit/Training;Energy conservation    PT Next Visit Plan  If pt brings in his AFO, will need to assess; continue sit<>stand from elevated surfaces, step ups, single limb stance and functional lower extremity strengthening; work in parallel bars to decrease UE support    PT Home Exercise Plan  [id number] ([employer])    Consulted and Agree with Plan of Care  Patient  Patient will benefit from skilled therapeutic intervention in order to improve the following deficits and impairments:  Abnormal gait, Decreased activity tolerance, Decreased balance, Decreased coordination, Decreased range of motion, Decreased knowledge of use of DME, Decreased knowledge of precautions, Decreased endurance, Decreased strength, Difficulty walking, Postural dysfunction, Impaired sensation, Obesity, Pain  Visit Diagnosis: Muscle weakness (generalized)  Unsteadiness on feet  Other abnormalities of gait and mobility     Problem List Patient Active Problem List   Diagnosis Date Noted  . 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 Dupuytren's contracture of left hand 09/20/2019  . Hyperlipidemia 08/24/2019  . Upper and lower extremity pain 08/24/2019  . 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 (CVA) 08/09/2019  . OSA on CPAP 08/09/2019  . Copious oral secretions 08/09/2019  . Anemia of chronic  disease   . Sepsis with acute renal failure without septic shock ([facility])   . Dysphagia due to recent cerebral infarction   . Diabetes mellitus type 2 in obese ([facility])   . Morbid obesity ([facility])   . Status post insertion of percutaneous endoscopic gastrostomy (PEG) tube ([facility])   . History of CVA (cerebrovascular accident) 06/27/2019  . Aspiration pneumonia ([facility]) 06/14/2019  . Essential hypertension 06/14/2019  . 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 Renal insufficiency 05/30/2019  . Diplopia   . Chronic fatigue     Keanan Melander W. 11/22/2019, 9:48 PM  Gean Maidens., PT   [facility] [address] [place], [place], [address] Phone: [phone]   Fax:  [phone]  Name: Aarush Stukey MRN: [id number] Date of Birth: [birth year]

## 2019-11-22 NOTE — Therapy (Signed)
[facility] [address] [place], [place], [address] Phone: [phone]   Fax:  [phone]  Speech Language Pathology Treatment  Patient Details  Name: Michael Bright MRN: [id number] Date of Birth: Aug 20, 1956 Referring Provider (SLP): Dr. Riley Kill   Encounter Date: 11/22/2019  End of Session - 11/22/19 1625    Visit Number  3    Number of Visits  17    Date for SLP Re-Evaluation  12/25/19    SLP Start Time  1450    SLP Stop Time   1530    SLP Time Calculation (min)  40 min       Past Medical History:  Diagnosis Date  . Acute CVA (cerebrovascular accident) ([facility]) 05/30/2019  . Acute kidney injury ([facility])   . Acute respiratory failure ([facility])   . Diabetes ([facility])   . Pneumonia    aspiration pneumonia  . Septic shock ([facility])   . Stroke ([facility])   . Urinary tract infection   . Urinary tract infection without hematuria     Past Surgical History:  Procedure Laterality Date  . CHOLECYSTECTOMY    . IR GASTROSTOMY TUBE MOD SED  07/04/2019    There were no vitals filed for this visit.  Subjective Assessment - 11/22/19 1449    Subjective  Pt enters with clear voice.    Patient is accompained by:  Family member   wife   Currently in Pain?  No/denies            ADULT SLP TREATMENT - 11/22/19 1453      General Information   Behavior/Cognition  Alert;Pleasant mood;Cooperative      Treatment Provided   Treatment provided  Dysphagia      Dysphagia Treatment   Temperature Spikes Noted  No    Treatment Methods  Skilled observation;Compensation strategy training;Patient/caregiver education    Patient observed directly with PO's  Yes    Type of PO's observed  Nectar-thick liquids    Liquids provided via  Teaspoon    Oral Phase Signs & Symptoms  --   nasal regurgitation when pt fatigued   Pharyngeal Phase Signs & Symptoms  Complaints of residue   after the first/second swallows(cleared after cough/3rd swal   Other  treatment/comments  SLP observed pt with EMST device - pt with puffed out cheeks consistently - SLP cued pt to try to keep cheeks firm and tight, even with the lowest setting. SLP assumes pt with muscle memory for cheeks puffed out - SLP told pt to practice in front of a mirror with attempts ta cheeks tight wit hlowest setting to habitualie tight cheeks blows. With nectar teaspoons, on the 6th trial pt complained of nasal regurgitation. SLP also heard intermittent hypernasality in pt's speech with conversation. SLP educated pt re: VP closure and explained why this might be occurring. SLP had pt wait 15 seconds between teaspoon trials and pt stated that nasal regurgitation ceased. Pt demonstrated other s/sx fatigue by end of session, such as slight delay wiht second swallows. Pt without overt s/s aspiration and SLP told pt SLP thinks it safe for pt to do nectar teaspoons at home with oral care prior and afterwards. Pt acknowledged understnding. SLP said pt could do 1/4 cup of nectar liquids via teaspoons. Pt has buttermilk and tomato juice at home.       Assessment / Recommendations / Plan   Plan  Continue with current plan of care      Progression Toward Goals  Progression toward goals  Progressing toward goals       SLP Education - 11/22/19 1625    Education Details  nectar via teaspoon is ok at home - 1/4 cup with two swallows, throat clear and third swallow, need oral care before and after nectar liqiud    Person(s) Educated  Patient;Spouse    Methods  Explanation    Comprehension  Verbalized understanding       SLP Short Term Goals - 11/22/19 1639      SLP SHORT TERM GOAL #1   Title  Pt will complete dysphagia HEP with rare min A x 3 sessions.    Time  4    Period  Weeks    Status  On-going      SLP SHORT TERM GOAL #2   Title  Patient will tolerate electrode placement for NMES x 3 sessions.    Time  4    Period  Weeks    Status  On-going      SLP SHORT TERM GOAL #3   Title   Patient will tell SLP 3 s/sx of aspiration PNA.    Time  4    Period  Weeks    Status  On-going       SLP Long Term Goals - 11/22/19 1639      SLP LONG TERM GOAL #1   Title  Pt will demonstrate dysphagia HEP with modified independence x3 sessions.    Time  8    Period  Weeks    Status  On-going      SLP LONG TERM GOAL #2   Title  Pt will report thorough oral care prior to water/ice chips x6 sessions.    Time  8    Period  Weeks    Status  On-going       Plan - 11/22/19 1626    Clinical Impression Statement  [name] gain enters ST room with clear voice - no hydrophonia present. 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 In EMST today pt req'd cues for keeping cheeks tight instead of puffed during exhaltion - he is to cont to work on this with a Geologist, engineering at home. He has been PEG dependent since hospitalization. Worked with CIR and until recently [facility] SLP; has been working on teaspoon trials of nectar with SLP only since repeat MBS however SLP has told pt that he oculd attempt nectar at home if oral care prior to and afterwards. Has also been taking sips of regular water and ice chips after oral care at home as well. Patient has been completing pharyngeal exercises and RMT (expiratory) with improvement in function noted on most recent MBS, however AMN still recommended due to pharyngeal residue and aspiration risk. I recommend cont'd skilled ST to address dysphagia for training in aspiration precautions, pharyngeal exercises, and possibly NMES to improve pt's swallow function for possible advancement to comfort feeds. Advise nectar thick liquid trials with SLP only at this time; continue free water protocol at home after rigorous oral care.    Speech Therapy Frequency  1x /week    Duration  --   8 weeks or 17 visits   Treatment/Interventions  Aspiration precaution training;Cueing hierarchy;SLP instruction and  feedback;Pharyngeal strengthening exercises;NMES;Compensatory techniques;Diet toleration management by SLP;Trials of upgraded texture/liquids;Patient/family education    Potential to Achieve Goals  Good    Potential Considerations  Severity of impairments    SLP Home Exercise  Plan  Continue swallow ex and RMT per [facility] SLP; bring exercises to next session    Consulted and Agree with Plan of Care  Patient;Family member/caregiver       Patient will benefit from skilled therapeutic intervention in order to improve the following deficits and impairments:   Dysphagia, pharyngeal phase    Problem List Patient Active Problem List   Diagnosis Date Noted  . 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 Dupuytren's contracture of left hand 09/20/2019  . Hyperlipidemia 08/24/2019  . Upper and lower extremity pain 08/24/2019  . 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 (CVA) 08/09/2019  . OSA on CPAP 08/09/2019  . Copious oral secretions 08/09/2019  . Anemia of chronic disease   . Sepsis with acute renal failure without septic shock (HCC)   . Dysphagia due to recent cerebral infarction   . Diabetes mellitus type 2 in obese ([facility])   . Morbid obesity ([facility])   . Status post insertion of percutaneous endoscopic gastrostomy (PEG) tube ([facility])   . History of CVA (cerebrovascular accident) 06/27/2019  . Aspiration pneumonia ([facility]CC) 06/14/2019  . Essential hypertension 06/14/2019  . 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 Renal insufficiency 05/30/2019  . Diplopia   . Chronic fatigue     Adrine Hayworth ,MS, CCC-SLP  11/22/2019, 4:40 PM  [facility] [address] [place], [place], [address] Phone: [phone]   Fax:  [phone]   Name: Michael Bright MRN: [id number] Date of Birth: June 14, 1956

## 2019-11-22 NOTE — Telephone Encounter (Signed)
I called patient and LVM regarding rescheduling 2/23 appointment due to NP out. Requested patient call back to reschedule.

## 2019-11-24 ENCOUNTER — Ambulatory Visit (INDEPENDENT_AMBULATORY_CARE_PROVIDER_SITE_OTHER): Payer: BC Managed Care – PPO | Admitting: Primary Care

## 2019-11-24 ENCOUNTER — Encounter: Payer: Self-pay | Admitting: Primary Care

## 2019-11-24 ENCOUNTER — Other Ambulatory Visit: Payer: Self-pay

## 2019-11-24 VITALS — BP 126/66 | HR 75 | Temp 97.4°F | Ht 72.0 in | Wt 306.3 lb

## 2019-11-24 DIAGNOSIS — G4733 Obstructive sleep apnea (adult) (pediatric): Secondary | ICD-10-CM

## 2019-11-24 DIAGNOSIS — M79603 Pain in arm, unspecified: Secondary | ICD-10-CM

## 2019-11-24 DIAGNOSIS — Z931 Gastrostomy status: Secondary | ICD-10-CM

## 2019-11-24 DIAGNOSIS — I10 Essential (primary) hypertension: Secondary | ICD-10-CM

## 2019-11-24 DIAGNOSIS — R39198 Other difficulties with micturition: Secondary | ICD-10-CM

## 2019-11-24 DIAGNOSIS — E669 Obesity, unspecified: Secondary | ICD-10-CM | POA: Diagnosis not present

## 2019-11-24 DIAGNOSIS — Z8673 Personal history of transient ischemic attack (TIA), and cerebral infarction without residual deficits: Secondary | ICD-10-CM

## 2019-11-24 DIAGNOSIS — E1169 Type 2 diabetes mellitus with other specified complication: Secondary | ICD-10-CM

## 2019-11-24 DIAGNOSIS — R6889 Other general symptoms and signs: Secondary | ICD-10-CM

## 2019-11-24 DIAGNOSIS — N289 Disorder of kidney and ureter, unspecified: Secondary | ICD-10-CM

## 2019-11-24 DIAGNOSIS — I69391 Dysphagia following cerebral infarction: Secondary | ICD-10-CM | POA: Diagnosis not present

## 2019-11-24 DIAGNOSIS — R5382 Chronic fatigue, unspecified: Secondary | ICD-10-CM

## 2019-11-24 DIAGNOSIS — M79606 Pain in leg, unspecified: Secondary | ICD-10-CM

## 2019-11-24 DIAGNOSIS — R600 Localized edema: Secondary | ICD-10-CM

## 2019-11-24 DIAGNOSIS — Z9989 Dependence on other enabling machines and devices: Secondary | ICD-10-CM

## 2019-11-24 DIAGNOSIS — K5900 Constipation, unspecified: Secondary | ICD-10-CM

## 2019-11-24 HISTORY — DX: Localized edema: R60.0

## 2019-11-24 LAB — BRAIN NATRIURETIC PEPTIDE: Pro B Natriuretic peptide (BNP): 14 pg/mL (ref 0.0–100.0)

## 2019-11-24 LAB — BASIC METABOLIC PANEL WITH GFR
BUN: 24 mg/dL — ABNORMAL HIGH (ref 6–23)
CO2: 32 meq/L (ref 19–32)
Calcium: 9.2 mg/dL (ref 8.4–10.5)
Chloride: 103 meq/L (ref 96–112)
Creatinine, Ser: 0.75 mg/dL (ref 0.40–1.50)
GFR: 127.49 mL/min
Glucose, Bld: 72 mg/dL (ref 70–99)
Potassium: 4.7 meq/L (ref 3.5–5.1)
Sodium: 140 meq/L (ref 135–145)

## 2019-11-24 LAB — POCT GLYCOSYLATED HEMOGLOBIN (HGB A1C): Hemoglobin A1C: 5.7 % — AB (ref 4.0–5.6)

## 2019-11-24 MED ORDER — FLUTICASONE PROPIONATE 50 MCG/ACT NA SUSP: 1.0000 | Freq: Two times a day (BID) | NASAL | 2 refills | Status: DC | PRN

## 2019-11-24 MED ORDER — SENNOSIDES 8.8 MG/5ML PO SYRP: 5.0000 mL | ORAL_SOLUTION | Freq: Two times a day (BID) | ORAL | 0 refills | Status: DC | PRN

## 2019-11-24 NOTE — Progress Notes (Signed)
Subjective:    Patient ID: Michael Bright, male    DOB: September 19, 1956, 64 y.o.   MRN: [id number]  HPI  This visit occurred during the SARS-CoV-2 public health emergency.  Safety protocols were in place, including screening questions prior to the visit, additional usage of staff PPE, and extensive cleaning of exam room while observing appropriate contact time as indicated for disinfecting solutions.   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  He is also requesting handicap placard for weakness secondary to stroke in November 2020.  He is injecting Basaglar 13 units twice daily. He is checking his blood sugars 3-4 times daily and is getting readings of:  AM fasting: 60's-80's Before lunch: 60's-80's Before dinner: 60's-low 110's  Lowest reading: 58 Highest reading: 181  He is following with neurology and will be seeing her in March 2021. He is following with physical medicine, Dr. Dava Najjar. He is active in physical therapy, speech therapy, occupational therapy two to three times weekly, recently started outpatient.  He is compliant to Pulmicort nebulizer twice daily and doing well. Using Lidoderm patches as needed.   He is doing well on gabapentin, now doing 5 mg HS which was increased by physical medicine.    Bowel movements occur once weekly, he is needing a refill of the Senokot solution. Doing well on finasteride solution for urine flow.   He is concerned that he's not losing weight despite nutritional supplements per G-Tube. He does not see a nutritionist. Also with bilateral lower extremity edema, mostly around ankles. He sits for most of the day, doesn't elevate his legs when resting.  BP Readings from Last 3 Encounters:  11/24/19 126/66  11/01/19 126/78    10/31/19 131/69   Wt Readings from Last 3 Encounters:  11/24/19 (!) 306 lb 5 oz (138.9 kg)  11/01/19 (!) 305 lb (138.3 kg)  09/20/19 (!) 306 lb (138.8 kg)     Review of Systems  Constitutional: Negative for unexpected weight change.  Eyes: Negative for visual disturbance.  Respiratory: Negative for shortness of breath.   Cardiovascular: Positive for leg swelling. Negative for chest pain.  Neurological: Positive for weakness. Negative for dizziness and headaches.       Past Medical History:  Diagnosis Date  . Acute CVA (cerebrovascular accident) ([facility]) 05/30/2019  . Acute kidney injury ([facility])   . Acute respiratory failure ([place])   . Diabetes ([facility])   . Pneumonia    aspiration pneumonia  . Septic shock ([place])   . Stroke ([place])   . Urinary tract infection   . Urinary tract infection without hematuria      Social History   Socioeconomic History  . Marital status: Married    Spouse name: Not on file  . Number of children: Not on file  . Years of education: Not on file  . Highest education level: Not on file  Occupational History  . Not on file  Tobacco Use  . Smoking status: Never Smoker  . Smokeless tobacco: Never Used  Substance and Sexual Activity  . Alcohol use: Yes  . Drug use: No  . Sexual activity: Yes  Other Topics Concern  . Not on file  Social History Narrative  . Not on file   Social Determinants of Health   Financial Resource Strain:   .  Difficulty of Paying Living Expenses: Not on file  Food Insecurity:   . Worried About Programme researcher, broadcasting/film/video in the Last Year: Not on file  . Ran Out of Food in the Last Year: Not on file  Transportation Needs:   . Lack of Transportation (Medical): Not on file  . Lack of Transportation (Non-Medical): Not on file  Physical Activity:   . Days of Exercise per Week: Not on file  . Minutes of Exercise per Session: Not on file  Stress:   . Feeling of Stress : Not on file  Social Connections:   . Frequency of  Communication with Friends and Family: Not on file  . Frequency of Social Gatherings with Friends and Family: Not on file  . Attends Religious Services: Not on file  . Active Member of Clubs or Organizations: Not on file  . Attends Banker Meetings: Not on file  . Marital Status: Not on file  Intimate Partner Violence:   . Fear of Current or Ex-Partner: Not on file  . Emotionally Abused: Not on file  . Physically Abused: Not on file  . Sexually Abused: Not on file    Past Surgical History:  Procedure Laterality Date  . CHOLECYSTECTOMY    . IR GASTROSTOMY TUBE MOD SED  07/04/2019    No family history on file.  No Known Allergies  Current Outpatient Medications on File Prior to Visit  Medication Sig Dispense Refill  . Accu-Chek FastClix Lancets MISC USE AS DIRECTED UP TO FOUR TIMES DAILY 102 each 5  . ACCU-CHEK GUIDE test strip USE AS DIRECTED UP TO FOUR TIMES DAILY 100 strip 3  . acetaminophen (TYLENOL) 325 MG tablet Place 1-2 tablets (325-650 mg total) into feeding tube every 4 (four) hours as needed for mild pain.    . Amino Acids-Protein Hydrolys (FEEDING SUPPLEMENT, PRO-STAT SUGAR FREE 64,) LIQD Place 30 mLs into feeding tube 2 (two) times daily. 887 mL 6  . aspirin 81 MG chewable tablet Place 1 tablet (81 mg total) into feeding tube daily.    Marland Kitchen atorvastatin (LIPITOR) 40 MG tablet Take 1 tablet (40 mg total) by mouth at bedtime. For cholesterol. 90 tablet 3  . budesonide (PULMICORT) 0.25 MG/2ML nebulizer solution Take 2 mLs (0.25 mg total) by nebulization 2 (two) times daily. 60 mL 1  . chlorhexidine (PERIDEX) 0.12 % solution 15 mLs by Mouth Rinse route 2 (two) times daily. 1893 mL 3  . finasteride (PROSCAR) 5 MG tablet Place one tablet in GI tube once daily for urine flow. 90 tablet 1  . gabapentin (NEURONTIN) 250 MG/5ML solution Take 2.5-5 mLs (125-250 mg total) by mouth at bedtime. For pain. 150 mL 6  . glycopyrrolate (ROBINUL) 1 MG tablet PLACE 2 TABLETS (2 MG  TOTAL) INTO FEEDING TUBE 3 (THREE) TIMES DAILY. FOR ORAL SECRETIONS. 180 tablet 1  . guaifenesin (ROBITUSSIN) 100 MG/5ML syrup PLACE 10 MLS (200 MG TOTAL) INTO FEEDING TUBE 4 (FOUR) TIMES DAILY - WITH MEALS AND AT BEDTIME. 948 mL 0  . guaiFENesin-dextromethorphan (ROBITUSSIN DM) 100-10 MG/5ML syrup Place 15 mLs into feeding tube every 6 (six) hours as needed for cough. 473 mL 0  . Insulin Glargine (BASAGLAR KWIKPEN ) Inject 15 Units into the skin every morning.    . Insulin Pen Needle (BD PEN NEEDLE MICRO U/F) 32G X 6 MM MISC Use daily with insulin. 100 each 2  . lidocaine (LIDODERM) 5 % Apply at 8 pm and remove at 8 am daily. 30  patch 0  . mouth rinse LIQD solution 15 mLs by Mouth Rinse route 5 (five) times daily. 946 mL 1  . Nutritional Supplements (FEEDING SUPPLEMENT, OSMOLITE 1.5 CAL,) LIQD Place 325 mLs into feeding tube 4 (four) times daily.  0  . Water For Irrigation, Sterile (FREE WATER) SOLN Place 200 mLs into feeding tube every 8 (eight) hours. Use filtered or bottled water (not distilled water)     No current facility-administered medications on file prior to visit.    BP 126/66   Pulse 75   Temp (!) 97.4 F (36.3 C) (Temporal)   Ht 6' (1.829 m)   Wt (!) 306 lb 5 oz (138.9 kg)   SpO2 97%   BMI 41.54 kg/m    Objective:   Physical Exam  Constitutional: He appears well-nourished.  Cardiovascular: Normal rate and regular rhythm.  Respiratory: Effort normal and breath sounds normal.  GI: Soft. Bowel sounds are normal. There is no abdominal tenderness.  G-tube site appears unremarkable.  Musculoskeletal:     Cervical back: Neck supple.     Comments: Ambulating well in clinic with walker  Skin: Skin is warm and dry.  Psychiatric: He has a normal mood and affect.           Assessment & Plan:

## 2019-11-24 NOTE — Assessment & Plan Note (Signed)
Following with outpatient rehab services including PT/OT/ST. Appears to be improving. Handicap placard signed today given side effects from CVA.

## 2019-11-24 NOTE — Assessment & Plan Note (Signed)
For dysphagia secondary to CVA. Referral placed to nutritional services.

## 2019-11-24 NOTE — Assessment & Plan Note (Signed)
Repeat BMP pending. 

## 2019-11-24 NOTE — Assessment & Plan Note (Signed)
Doing well on gabapentin 5 mg HS. Continue same.

## 2019-11-24 NOTE — Assessment & Plan Note (Signed)
Compliant to CPAP machine.  

## 2019-11-24 NOTE — Assessment & Plan Note (Signed)
Continued, managed on supplementation via G-Tube. Referral placed to nutritional services for management.

## 2019-11-24 NOTE — Assessment & Plan Note (Signed)
Secondary to effects from CVA. Doing well on finasteride. Continue same.

## 2019-11-24 NOTE — Assessment & Plan Note (Signed)
Noted to ankles and proximal to ankles today. No pitting. Compliant to compression stockings. Encouraged elevation.  Check BNP and BMP. Consider low dose diuretic if needed.

## 2019-11-24 NOTE — Assessment & Plan Note (Signed)
Continued. Encouraged regular activity when possible.

## 2019-11-24 NOTE — Assessment & Plan Note (Signed)
A1C today of 5.7 which is under excellent control. He is having recurrent readings in the 60's.  Reduce Basaglar to 15 units AM.  He and his wife will monitor readings and report readings consistently at or above 130.  Follow up in 3 months.

## 2019-11-24 NOTE — Assessment & Plan Note (Signed)
Stable in the office today, continue to monitor.  

## 2019-11-24 NOTE — Assessment & Plan Note (Signed)
Continued, compliant to glycopyrrolate and doing well. Also doing well with guaifenesin.

## 2019-11-24 NOTE — Patient Instructions (Addendum)
Stop by the lab prior to leaving today. I will notify you of your results once received.   Stop by the front desk and speak with either Shirlee Limerick or Charmaine regarding your referral to the nutritionist.  Elevate your legs when resting at home to reduce swelling.   We've reduced your insulin to 15 units every morning.  Continue to monitor your blood sugars.   Continue to monitor your weight, notify me if you see a gain of >2 pounds in 24 hours or >5 pounds in one week.  It was a pleasure to see you today!

## 2019-11-27 ENCOUNTER — Ambulatory Visit: Payer: BC Managed Care – PPO | Admitting: Occupational Therapy

## 2019-11-27 ENCOUNTER — Ambulatory Visit: Payer: BC Managed Care – PPO

## 2019-11-27 ENCOUNTER — Ambulatory Visit: Payer: BC Managed Care – PPO | Admitting: Physical Therapy

## 2019-11-27 ENCOUNTER — Other Ambulatory Visit: Payer: Self-pay

## 2019-11-27 DIAGNOSIS — R278 Other lack of coordination: Secondary | ICD-10-CM

## 2019-11-27 DIAGNOSIS — R2689 Other abnormalities of gait and mobility: Secondary | ICD-10-CM

## 2019-11-27 DIAGNOSIS — M6281 Muscle weakness (generalized): Secondary | ICD-10-CM

## 2019-11-27 DIAGNOSIS — R1313 Dysphagia, pharyngeal phase: Secondary | ICD-10-CM

## 2019-11-27 DIAGNOSIS — R2681 Unsteadiness on feet: Secondary | ICD-10-CM

## 2019-11-27 NOTE — Therapy (Signed)
[facility] [address] [place], [place], [address] Phone: [phone]   Fax:  [phone]  Physical Therapy Treatment  Patient Details  Name: Michael Bright MRN: [id number] Date of Birth: 06/05/1956 Referring Provider (PT): Faith Rogue, MD   Encounter Date: 11/27/2019  PT End of Session - 11/27/19 1825    Visit Number  4    Number of Visits  25    Date for PT Re-Evaluation  01/29/20    Authorization Type  BCBS - 2021 primary, Medicare Secondary    PT Start Time  1446    PT Stop Time  1530    PT Time Calculation (min)  44 min    Equipment Utilized During Treatment  Gait belt    Activity Tolerance  Patient tolerated treatment well    Behavior During Therapy  [facility] for tasks assessed/performed       Past Medical History:  Diagnosis Date  . Acute CVA (cerebrovascular accident) ([facility]) 05/30/2019  . Acute kidney injury ([facility])   . Acute respiratory failure ([facility])   . Aspiration pneumonia ([facility]) 06/14/2019  . 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 Diabetes ([facility])   . 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 (CVA) 08/09/2019  . Pneumonia    aspiration pneumonia  . Sepsis with acute renal failure without septic shock ([facility])   . Septic shock ([facility])   . Stroke ([facility])   . Urinary tract infection   . Urinary tract infection without hematuria     Past Surgical History:  Procedure Laterality Date  . CHOLECYSTECTOMY    . IR GASTROSTOMY TUBE MOD SED  07/04/2019    There were no vitals filed for this visit.  Subjective Assessment - 11/27/19 1450    Subjective  Saw his PCP on Friday, decreased his insulin. L hand is not hurting like it used to. No falls.    Pertinent History  PMH: CVA (L lateral medullary syndrome) diabetes, HTN, OSA, morbid obesity.    Limitations  Standing;Walking;House hold activities    How long can you stand comfortably?  can stand approx. 10 minutes    How long can you walk  comfortably?  20 minutes with the rollator    Patient Stated Goals  wants to walk better, walk more steady, wants to be able to get up more easily.    Currently in Pain?  No/denies    Pain Onset  More than a month ago    Pain Onset  More than a month ago                       [facility] Adult PT Treatment/Exercise - 11/27/19 1501      Transfers   Transfers  Sit to Stand;Stand to Sit    Sit to Stand  5: Supervision;Without upper extremity assist    Sit to Stand Details  Verbal cues for sequencing;Verbal cues for technique    Stand to Sit  5: Supervision;Without upper extremity assist    Transfer Cueing  2 x 10 reps, Sit <> stands from elevated 24" mat table surface with no UE support, cues for forward lean to prevent BLE bracing against mat      Ambulation/Gait   Ambulation/Gait  Yes    Ambulation/Gait Assistance  5: Supervision;4: Min guard    Ambulation/Gait Assistance Details  cues provided for heel strike B, for posture and staying upright in rollator     Ambulation Distance (Feet)  230 Feet  Assistive device  Rollator    Gait Pattern  Step-through pattern;Decreased stride length;Decreased dorsiflexion - left;Trunk flexed;Poor foot clearance - left;Decreased step length - right;Decreased dorsiflexion - right;Decreased weight shift to left    Ambulation Surface  Level;Indoor      Knee/Hip Exercises: Standing   Hip Flexion  Stengthening;Both;2 sets;10 reps;Knee bent;Limitations    Hip Flexion Limitations  2lb ankle weight B, cues for slowed and controlled as well as bringing RLE back down gently for SLS on L    Hip Abduction  Stengthening;Right;Left;1 set;10 reps;Knee straight    Abduction Limitations  using 2lb ankle weight B, cues for technique and slowed pace    Forward Step Up  Both;1 set;10 reps;Hand Hold: 2;Step Height: 4"    Forward Step Up Limitations  cues for technique, pt with incr difficulty with hip/knee flexion on L as well as foot placement on step with LLE     Other Standing Knee Exercises  With BUE support in bars: 1 x 15 reps heel/toe raises             PT Education - 11/27/19 1824    Education Details  PT tech helped to tighten pt's brakes on rollator after noted decr effectiveness after transfers (on R side) - instructed pt to go to [employer] (where he got rollator) and have it looked at/potentially get new wheels on R side as they are more warn down    Person(s) Educated  Patient    Methods  Explanation    Comprehension  Verbalized understanding       PT Short Term Goals - 11/20/19 1710      PT SHORT TERM GOAL #1   Title  Patient will be independent with initial HEP in order to build upon functional gains made in therapy.  ALL STGS DUE 12/13/19    Time  6   due to initial delay in scheduling   Period  Weeks    Target Date  12/13/19      PT SHORT TERM GOAL #2   Title  Patient to improve BERG balance test to at least 32/56 for decreased fall risk.    Time  6    Period  Weeks    Status  Revised      PT SHORT TERM GOAL #3   Title  Patient will decrease TUG time with bariatric rollator to 25 seconds of less to demo decr fall risk.    Baseline  30.22 seconds on 10/31/19    Time  6    Period  Weeks    Status  New      PT SHORT TERM GOAL #4   Title  Patient will improve gait velocity with bariatric rollator to at least 2.1 ft/sec to demo a limited community ambulator and decr fall risk.    Baseline  1.70 ft/sec    Time  6    Period  Weeks    Status  New      PT SHORT TERM GOAL #5   Title  Patient will decrease 5x sit <> stand time to 25 seconds of less from elevated mat table with UE support in order to demo improved functional LE strength.    Baseline  31.06 seconds    Time  6    Period  Weeks    Status  New        PT Long Term Goals - 11/20/19 1711      PT LONG TERM GOAL #1   Title  Patient will be independent with final HEP in order to build upon functional gains made in therapy. ALL LTGS DUE 01/29/20    Time  13    due to delay in scheduling   Period  Weeks    Status  New      PT LONG TERM GOAL #2   Title  Berg score to imporve to at least 40/56 for decreased fall risk.    Time  13    Period  Weeks    Status  Revised      PT LONG TERM GOAL #3   Title  Patient will decrease 5x sit <> stand time to 19 seconds of less from elevated mat table vs. standard chair with UE support in order to demo improved functional LE strength.    Time  13    Period  Weeks    Status  New      PT LONG TERM GOAL #4   Title  Patient will improve gait velocity with bariatric rollator vs. LRAD to at least 2.6 ft/sec to demo improved community mobility.    Time  13    Period  Weeks    Status  New      PT LONG TERM GOAL #5   Title  Patient will decrease TUG time with bariatric rollator vs. LRAD to 19 seconds of less to demo decr fall risk.    Time  13    Period  Weeks    Status  New      PT LONG TERM GOAL #6   Title  Patient will ambulate at least 300' with supervision over level indoor and outdoor surfaces with LRAD  with supervision in order to improve community mobility.    Time  13    Period  Weeks    Status  New            Plan - 11/27/19 1841    Clinical Impression Statement  Focus of today's skilled session was sit <> stand training, lower extremity strengthening, and gait training. Pt needing BUE support in // bars for step ups and dynamic standing exercises. Pt with decr proprioception in LLE - has difficulty with placement on step for step ups. Pt did not bring his AFO in today, states he will bring it in at next session. Intermittent standing/seated breaks due to fatigue.Will continue to progress towards LTGs.    Personal Factors and Comorbidities  Comorbidity 3+;Past/Current Experience;Time since onset of injury/illness/exacerbation    Comorbidities  diabetes, HTN, OSA, morbid obesity.    Examination-Activity Limitations  Squat;Transfers;Locomotion Level;Stairs    Examination-Participation  Restrictions  Community Activity    Stability/Clinical Decision Making  Evolving/Moderate complexity    Rehab Potential  Good    PT Frequency  2x / week    PT Duration  12 weeks    PT Treatment/Interventions  ADLs/Self Care Home Management;Balance training;Therapeutic exercise;Therapeutic activities;Functional mobility training;Stair training;Gait training;DME Instruction;Aquatic Therapy;Neuromuscular re-education;Patient/family education;Orthotic Fit/Training;Energy conservation    PT Next Visit Plan  If pt brings in his AFO, will need to assess; continue sit<>stand from elevated surfaces, step ups, single limb stance and functional lower extremity strengthening; work in parallel bars to decrease UE support    PT Home Exercise Plan  [id number] ([employer])    Consulted and Agree with Plan of Care  Patient       Patient will benefit from skilled therapeutic intervention in order to improve the following deficits and impairments:  Abnormal gait, Decreased activity tolerance, Decreased  balance, Decreased coordination, Decreased range of motion, Decreased knowledge of use of DME, Decreased knowledge of precautions, Decreased endurance, Decreased strength, Difficulty walking, Postural dysfunction, Impaired sensation, Obesity, Pain  Visit Diagnosis: Other lack of coordination  Muscle weakness (generalized)  Unsteadiness on feet  Other abnormalities of gait and mobility     Problem List Patient Active Problem List   Diagnosis Date Noted  . Lower extremity edema 11/24/2019  . Difficulty urinating 11/24/2019  . Dupuytren's contracture of left hand 09/20/2019  . Hyperlipidemia 08/24/2019  . Upper and lower extremity pain 08/24/2019  . OSA on CPAP 08/09/2019  . Copious oral secretions 08/09/2019  . Anemia of chronic disease   . Dysphagia due to recent cerebral infarction   . Diabetes mellitus type 2 in obese ([place])   . Morbid obesity ([place])   . Status post insertion of percutaneous  endoscopic gastrostomy (PEG) tube ([place])   . History of CVA (cerebrovascular accident) 06/27/2019  . Essential hypertension 06/14/2019  . Renal insufficiency 05/30/2019  . Diplopia   . Chronic fatigue     Arliss Journey, PT, DPT  11/27/2019, 6:43 PM  [facility] [address] [place], [place], [address] Phone: [phone]   Fax:  [phone]  Name: Michael Bright MRN: [id number] Date of Birth: [birth year]

## 2019-11-27 NOTE — Therapy (Signed)
[facility] [address] [place], [place], [address] Phone: [phone]   Fax:  [phone]  Speech Language Pathology Treatment  Patient Details  Name: Michael Bright MRN: [id number] Date of Birth: 07/30/1956 Referring Provider (SLP): Dr. Riley Kill   Encounter Date: 11/27/2019  End of Session - 11/27/19 1512    Visit Number  4    Number of Visits  17    Date for SLP Re-Evaluation  12/25/19    Authorization Type  BCBS Primary, Medicare secondary    SLP Start Time  1404    SLP Stop Time   1445    SLP Time Calculation (min)  41 min    Activity Tolerance  Patient tolerated treatment well       Past Medical History:  Diagnosis Date  . Acute CVA (cerebrovascular accident) (HCC) 05/30/2019  . Acute kidney injury (HCC)   . Acute respiratory failure (HCC)   . Aspiration pneumonia (HCC) 06/14/2019  . 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 Diabetes (HCC)   . 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 (CVA) 08/09/2019  . Pneumonia    aspiration pneumonia  . Sepsis with acute renal failure without septic shock (HCC)   . Septic shock (HCC)   . Stroke (HCC)   . Urinary tract infection   . Urinary tract infection without hematuria     Past Surgical History:  Procedure Laterality Date  . CHOLECYSTECTOMY    . IR GASTROSTOMY TUBE MOD SED  07/04/2019    There were no vitals filed for this visit.  Subjective Assessment - 11/27/19 1421    Subjective  Pt arrives with tomato juice.    Currently in Pain?  No/denies            ADULT SLP TREATMENT - 11/27/19 1422      General Information   Behavior/Cognition  Alert;Pleasant mood;Cooperative      Treatment Provided   Treatment provided  Dysphagia      Dysphagia Treatment   Temperature Spikes Noted  No    Treatment Methods  Skilled observation;Compensation strategy training;Patient/caregiver education    Patient observed directly with PO's  Yes    Type of PO's observed  Nectar-thick liquids    Liquids provided via  Teaspoon    Oral Phase Signs & Symptoms  --   none noted   Pharyngeal Phase Signs & Symptoms  Complaints of residue   rare, intermittent   Other treatment/comments  Pt using teaspoon to take boulses of tomato juice today with double swallow, throat clear and reswallow with each bolus. SLP req'd to provide cues for clear/reswallow x1. SLP noted pt needed to take short 5-second break after bolus #8 today due to pt-reported fatigue. Pt appeared to have more prnounced weaker swallow response after bolus #17 as hyothyroid excursion did ont seem as pronounced as in first ~15 boluses. Pt reports he feels like the throat clear after second swallow is dry most of the time. Pt denies overt s/s aspirtion PNA at current time. Pt was taking the same frequency of breaks between teaspoons, but longer (approx 10-12 seconds each) after the 26th bolus due to fatigue. SLP to ask referring MD for script for follow up MBSS.       Assessment / Recommendations / Plan   Plan  Continue with current plan of care      Progression Toward Goals   Progression toward goals  Progressing toward goals       SLP Education -  11/27/19 1513    Education Details  overt s/sx aspiration PNA, requesting script for follow up MBSS    Person(s) Educated  Patient    Methods  Explanation;Handout    Comprehension  Verbalized understanding       SLP Short Term Goals - 11/27/19 1455      SLP SHORT TERM GOAL #1   Title  Pt will complete dysphagia HEP with rare min A x 3 sessions.    Time  3    Period  Weeks    Status  On-going      SLP SHORT TERM GOAL #2   Title  Patient will tolerate electrode placement for NMES x 3 sessions.    Time  3    Period  Weeks    Status  On-going      SLP SHORT TERM GOAL #3   Title  Patient will tell SLP 3 s/sx of aspiration PNA.    Time  --    Period  --    Status  Achieved       SLP Long Term Goals - 11/27/19 1456       SLP LONG TERM GOAL #1   Title  Pt will demonstrate dysphagia HEP with modified independence x3 sessions.    Time  7    Period  Weeks    Status  On-going      SLP LONG TERM GOAL #2   Title  Pt will report thorough oral care prior to water/ice chips x6 sessions.    Time  7    Period  Weeks    Status  On-going       Plan - 11/27/19 1452    Clinical Impression Statement  Michael Bright again enters ST room with clear voice - no hydrophonia present.  He continues PEG dependent since hospitalization. Today worked on teaspoon trials of nectar with SLP and intermittent c/o residuals cleared with throat clear and reswallow. He has been doing nectar trials at home as well since last ST visit. Suggested to referring MD that pt have repeat MBSS. He has also been taking sips of regular water and ice chips after oral care at home as well. Patient has been completing pharyngeal exercises and RMT (expiratory) with improvement in function noted on most recent MBS (10-18-19), however AMN was still recommended due to pharyngeal residue and aspiration risk. I recommend cont'd skilled ST to address dysphagia for training in aspiration precautions, pharyngeal exercises, and possibly NMES to improve pt's swallow function for possible advancement to comfort feeds. Advise nectar thick liquid trials with SLP only at this time; continue free water protocol at home after rigorous oral care.    Speech Therapy Frequency  1x /week    Duration  --   8 weeks or 17 visits   Treatment/Interventions  Aspiration precaution training;Cueing hierarchy;SLP instruction and feedback;Pharyngeal strengthening exercises;NMES;Compensatory techniques;Diet toleration management by SLP;Trials of upgraded texture/liquids;Patient/family education    Potential to Achieve Goals  Good    Potential Considerations  Severity of impairments    SLP Home Exercise Plan  Continue swallow ex and RMT per [facility] SLP; bring exercises to next session    Consulted and  Agree with Plan of Care  Patient;Family member/caregiver       Patient will benefit from skilled therapeutic intervention in order to improve the following deficits and impairments:   Dysphagia, pharyngeal phase    Problem List Patient Active Problem List   Diagnosis Date Noted  . Lower extremity edema  11/24/2019  . Difficulty urinating 11/24/2019  . Dupuytren's contracture of left hand 09/20/2019  . Hyperlipidemia 08/24/2019  . Upper and lower extremity pain 08/24/2019  . OSA on CPAP 08/09/2019  . Copious oral secretions 08/09/2019  . Anemia of chronic disease   . Dysphagia due to recent cerebral infarction   . Diabetes mellitus type 2 in obese (HCC)   . Morbid obesity (HCC)   . Status post insertion of percutaneous endoscopic gastrostomy (PEG) tube (HCC)   . History of CVA (cerebrovascular accident) 06/27/2019  . Essential hypertension 06/14/2019  . Renal insufficiency 05/30/2019  . Diplopia   . Chronic fatigue     Michael Bright ,MS, CCC-SLP  11/27/2019, 3:18 PM  [facility] [address] [place], [place], [address] Phone: [phone]   Fax:  [phone]   Name: Michael Bright MRN: [id number] Date of Birth: Jan 04, 1956

## 2019-11-27 NOTE — Therapy (Signed)
[place] [address] [place], [place], [address] Phone: [phone]   Fax:  [phone]  Occupational Therapy Treatment  Patient Details  Name: Michael Bright MRN: [id number] Date of Birth: 1956/10/13 Referring Provider (OT): Dr. Naaman Plummer   Encounter Date: 11/27/2019  OT End of Session - 11/27/19 1339    Visit Number  4    Number of Visits  25    Date for OT Re-Evaluation  01/24/20    Authorization Type  [employer]    Authorization - Visit Number  4    Authorization - Number of Visits  10    OT Start Time  5749    OT Stop Time  1400    OT Time Calculation (min)  45 min    Activity Tolerance  Patient tolerated treatment well    Behavior During Therapy  [facility] for tasks assessed/performed       Past Medical History:  Diagnosis Date  . Acute CVA (cerebrovascular accident) ([place]) 05/30/2019  . Acute kidney injury ([place])   . Acute respiratory failure ([place])   . Aspiration pneumonia ([place]) 06/14/2019  . 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 Diabetes ([place])   . 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 (CVA) 08/09/2019  . Pneumonia    aspiration pneumonia  . Sepsis with acute renal failure without septic shock ([place])   . Septic shock ([place])   . Stroke ([place])   . Urinary tract infection   . Urinary tract infection without hematuria     Past Surgical History:  Procedure Laterality Date  . CHOLECYSTECTOMY    . IR GASTROSTOMY TUBE MOD SED  07/04/2019    There were no vitals filed for this visit.  Subjective Assessment - 11/27/19 1319    Pertinent History  Michael Bright is a 64 y.o. 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 He was  readmitted on 07/20/19 with septic shock due to Klebsiella pneumoniae bacteremia with UTI and hypoxic respiratory failure bilateral lung infiltrates due to rhino virus. He was intubated briefly from 10/1-10/2 and treated with broad spectrum antibiotics till cultures finalized. Antibiotics narrowed to cefdinir on 10/5 with recommendations to complete 2 weeks total antibiotic course.  Hospital course significant for AKI with abnormal LFTs due to shocked liver, intermittent hypoxic episode as well as significant oral secretions with dysphonia. Discharged home 08/08/19.    Limitations  NPO, feeding tube    Patient Stated Goals  improve coordination and walking    Currently in Pain?  No/denies       Placing and removing clothespins on antenna LUE for functional mid to high level reaching w/ min compensations shoulder.  Pt placing grooved pegs in pegboard Lt hand w/ min difficulty then removing w/ tweezers. Pt then manipulating grooved pegs for fingertip to/from palm translation (up to 5 in hand)  Reviewed goals and progress to date. Reviewed current HEP's and pt met STG #1. Practiced hooking/unhooking buttons on shirt (shirt on table). Pt also reports he can dress himself (even shoes/socks) and bathe himself seated w/ tub bench.  UBE x 8 min for UE strength/endurance and normal reciprocal movement pattern                      OT  Short Term Goals - 11/27/19 1347      OT SHORT TERM GOAL #1   Title  I with HEP.    Time  4    Period  Weeks    Status  Achieved    Target Date  11/25/19      OT SHORT TERM GOAL #2   Title  Pt will demonstrate improved fine motor coordination for ADLs as evidenced by decreasing 9 hole peg test score to 50 secs or less    Baseline  RUE 35.41, LUE 57.78    Time  4    Period  Weeks    Status  On-going      OT SHORT TERM GOAL #3   Title  Pt will demonstrate aiblity to retrievee light weight object at 110* shoulder flexion with LUE.    Time  4     Period  Weeks    Status  On-going      OT SHORT TERM GOAL #4   Title  Pt will perfrom light home management/ basic cooking with supervision.    Time  4    Period  Weeks    Status  New        OT Long Term Goals - 11/27/19 1347      OT LONG TERM GOAL #1   Title  I with updated HEP.    Time  12    Period  Weeks    Status  New      OT LONG TERM GOAL #2   Title  Pt will demonstrate improved LUE fine motor coordination for ADLS as evidenced by decreasing 9 hole peg test score to 42 secs or less.    Time  12    Period  Weeks    Status  New      OT LONG TERM GOAL #3   Title  Pt will retrieve a lightweight object at 120 shoulder flexion with LUE.    Time  12    Period  Weeks    Status  New      OT LONG TERM GOAL #4   Title  Pt will perform mod complex cooking/ home management modified independently    Time  12    Period  Weeks    Status  New      OT LONG TERM GOAL #5   Title  Pt will perfrom all basic ADLS modified independently.    Time  12    Period  Weeks    Status  Achieved   per pt report for BADLS           Plan - 11/27/19 1351    Clinical Impression Statement  Pt met STG #1 and LTG #5.    OT Occupational Profile and History  Detailed Assessment- Review of Records and additional review of physical, cognitive, psychosocial history related to current functional performance    Occupational performance deficits (Please refer to evaluation for details):  ADL's;IADL's;Leisure;Social Participation    Body Structure / Function / Physical Skills  ADL;UE functional use;Flexibility;Pain;FMC;ROM;Coordination;GMC;Sensation;Decreased knowledge of precautions;Decreased knowledge of use of DME;IADL;Dexterity;Strength;Mobility;Endurance;Balance;Vision    Rehab Potential  Good    OT Frequency  2x / week    OT Duration  12 weeks    OT Treatment/Interventions  Self-care/ADL training;Aquatic Therapy;DME and/or AE instruction;Patient/family education;Balance training;Passive range  of motion;Gait Training;Paraffin;Cryotherapy;Fluidtherapy;Splinting;Therapist, nutritional;Contrast Bath;Electrical Stimulation;Moist Heat;Therapeutic exercise;Manual Therapy;Therapeutic activities;Neuromuscular education    Plan  mid range functional reach and coordination activities.  Consulted and Agree with Plan of Care  Patient       Patient will benefit from skilled therapeutic intervention in order to improve the following deficits and impairments:   Body Structure / Function / Physical Skills: ADL, UE functional use, Flexibility, Pain, FMC, ROM, Coordination, GMC, Sensation, Decreased knowledge of precautions, Decreased knowledge of use of DME, IADL, Dexterity, Strength, Mobility, Endurance, Balance, Vision       Visit Diagnosis: Other lack of coordination  Muscle weakness (generalized)    Problem List Patient Active Problem List   Diagnosis Date Noted  . Lower extremity edema 11/24/2019  . Difficulty urinating 11/24/2019  . Dupuytren's contracture of left hand 09/20/2019  . Hyperlipidemia 08/24/2019  . Upper and lower extremity pain 08/24/2019  . OSA on CPAP 08/09/2019  . Copious oral secretions 08/09/2019  . Anemia of chronic disease   . Dysphagia due to recent cerebral infarction   . Diabetes mellitus type 2 in obese ([place])   . Morbid obesity ([place])   . Status post insertion of percutaneous endoscopic gastrostomy (PEG) tube ([place])   . History of CVA (cerebrovascular accident) 06/27/2019  . Essential hypertension 06/14/2019  . Renal insufficiency 05/30/2019  . Diplopia   . Chronic fatigue     Carey Bullocks, OTR/L 11/27/2019, 1:53 PM  [place] [address] [place], [place], [address] Phone: [phone]   Fax:  [phone]  Name: Michael Bright MRN: [id number] Date of Birth: Aug 03, 1956

## 2019-11-27 NOTE — Patient Instructions (Signed)
Signs of Aspiration Pneumonia   . Chest pain/tightness . Fever (can be low grade) . Cough  o With foul-smelling phlegm (sputum) o With sputum containing pus or blood o With greenish sputum . Fatigue  . Shortness of breath  . Wheezing   **IF YOU HAVE THESE SIGNS, CONTACT YOUR DOCTOR OR GO TO THE EMERGENCY DEPARTMENT OR URGENT CARE AS SOON AS POSSIBLE**      

## 2019-11-28 ENCOUNTER — Telehealth (HOSPITAL_COMMUNITY): Payer: Self-pay | Admitting: Physical Medicine & Rehabilitation

## 2019-11-28 DIAGNOSIS — R1312 Dysphagia, oropharyngeal phase: Secondary | ICD-10-CM

## 2019-11-28 NOTE — Telephone Encounter (Signed)
-----   Message from Barron Alvine, CCC-SLP sent at 11/27/2019  2:48 PM EST ----- Dr. Riley Kill- [name] is about 6 weeks out from his last MBSS (10-18-19) and I think he might be ready for a follow up. He reports he has been faithful with his HEP, and is managing his secretions- has not had wet voice since I began seeing him two weeks ago.  He has been on water protocol since he has been at home and we have been doing teaspoons nectar here. No overt s/s aspiration PNA at all.  If agreed, please send script for MBSS. Thanks!  Take care- Baldo Ash

## 2019-11-28 NOTE — Telephone Encounter (Signed)
Sounds good Michael Bright. I put in an order for MBS at [facility]. Thanks!

## 2019-11-29 ENCOUNTER — Other Ambulatory Visit: Payer: Self-pay

## 2019-11-29 ENCOUNTER — Ambulatory Visit: Payer: BC Managed Care – PPO | Admitting: Physical Therapy

## 2019-11-29 ENCOUNTER — Ambulatory Visit: Payer: BC Managed Care – PPO

## 2019-11-29 ENCOUNTER — Ambulatory Visit: Payer: BC Managed Care – PPO | Admitting: Occupational Therapy

## 2019-11-29 ENCOUNTER — Encounter: Payer: Self-pay | Admitting: Occupational Therapy

## 2019-11-29 ENCOUNTER — Other Ambulatory Visit (HOSPITAL_COMMUNITY): Payer: Self-pay

## 2019-11-29 DIAGNOSIS — R278 Other lack of coordination: Secondary | ICD-10-CM | POA: Diagnosis not present

## 2019-11-29 DIAGNOSIS — R1310 Dysphagia, unspecified: Secondary | ICD-10-CM

## 2019-11-29 DIAGNOSIS — M6281 Muscle weakness (generalized): Secondary | ICD-10-CM

## 2019-11-29 DIAGNOSIS — R2689 Other abnormalities of gait and mobility: Secondary | ICD-10-CM

## 2019-11-29 DIAGNOSIS — R208 Other disturbances of skin sensation: Secondary | ICD-10-CM

## 2019-11-29 DIAGNOSIS — R2681 Unsteadiness on feet: Secondary | ICD-10-CM

## 2019-11-29 DIAGNOSIS — R1313 Dysphagia, pharyngeal phase: Secondary | ICD-10-CM

## 2019-11-29 NOTE — Therapy (Signed)
[facility] [address] [place], [place], [address] Phone: [phone]   Fax:  [phone]  Physical Therapy Treatment  Patient Details  Name: Michael Bright MRN: [id number] Date of Birth: 1956-03-03 Referring Provider (PT): Faith Rogue, MD   Encounter Date: 11/29/2019  PT End of Session - 11/29/19 2100    Visit Number  5    Number of Visits  25    Date for PT Re-Evaluation  01/29/20    Authorization Type  BCBS - 2021 primary, Medicare Secondary    PT Start Time  1618    PT Stop Time  1700    PT Time Calculation (min)  42 min    Equipment Utilized During Treatment  Gait belt    Activity Tolerance  Patient tolerated treatment well    Behavior During Therapy  [facility] for tasks assessed/performed       Past Medical History:  Diagnosis Date  . Acute CVA (cerebrovascular accident) (HCC) 05/30/2019  . Acute kidney injury (HCC)   . Acute respiratory failure (HCC)   . Aspiration pneumonia (HCC) 06/14/2019  . 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 Diabetes (HCC)   . 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 (CVA) 08/09/2019  . Pneumonia    aspiration pneumonia  . Sepsis with acute renal failure without septic shock (HCC)   . Septic shock (HCC)   . Stroke (HCC)   . Urinary tract infection   . Urinary tract infection without hematuria     Past Surgical History:  Procedure Laterality Date  . CHOLECYSTECTOMY    . IR GASTROSTOMY TUBE MOD SED  07/04/2019    There were no vitals filed for this visit.  Subjective Assessment - 11/29/19 1621    Subjective  Has a little tightness in his shoulder, feeling better after OT. Wearing his L AFO today. No falls.    Pertinent History  PMH: CVA (L lateral medullary syndrome) diabetes, HTN, OSA, morbid obesity.    Limitations  Standing;Walking;House hold activities    How long can you stand comfortably?  can stand approx. 10 minutes    How long can you walk  comfortably?  20 minutes with the rollator    Patient Stated Goals  wants to walk better, walk more steady, wants to be able to get up more easily.    Currently in Pain?  Yes    Pain Score  3     Pain Location  Neck    Pain Orientation  Right    Pain Descriptors / Indicators  Tightness    Pain Type  Acute pain   2 weeks   Pain Onset  More than a month ago    Aggravating Factors   sleeping    Pain Relieving Factors  getting up    Pain Onset  More than a month ago                       [facility] Adult PT Treatment/Exercise - 11/29/19 1629      Transfers   Transfers  Sit to Stand;Stand to Sit    Sit to Stand  5: Supervision;With upper extremity assist;Without upper extremity assist;Other (comment)   from elevated mat table at 24"   Sit to Stand Details  Verbal cues for sequencing;Verbal cues for technique    Stand to Sit  5: Supervision;Without upper extremity assist    Stand to Sit Details  cues for eccentric control    Transfer  Cueing  1 x 10 reps cue for forward weight shift    Comments  2 x 10 reps mini squats at edge of mat table, rollator in front for safety with intermittent UE support, cues for technique and cues for glute activation in standing, min guard      Ambulation/Gait   Ambulation/Gait  Yes    Ambulation/Gait Assistance  5: Supervision    Ambulation/Gait Assistance Details  pt wearing L AFO today with improved LLE foot clearance    Ambulation Distance (Feet)  115 Feet    Assistive device  Rollator    Gait Pattern  Step-through pattern;Decreased stride length;Decreased dorsiflexion - left;Trunk flexed;Poor foot clearance - left;Decreased step length - right;Decreased dorsiflexion - right;Decreased weight shift to left    Ambulation Surface  Level;Indoor      High Level Balance   High Level Balance Comments  with BUE support: slow marching in // bars with cues to hold hip/knee flexion at top for approx 2-3 seconds for SLS, down and back 3 reps, 3 reps  ambulating forward walking with single UE support, 3 reps backwards walking with single UE support with min guard - cues for weight shifting. With single UE support progressing to no UE support: 1 x 10 reps alternating step taps to  4" step, cues to perform "quietly" for improved control      Therapeutic Activites    Therapeutic Activities  Other Therapeutic Activities    Other Therapeutic Activities  pt's brakes not 100% working, as noted by when pt sat in rollator seat for rest break, rollator moved slightly when sitting with therpiast having to hold it steady for support. instructed pt and pt's wife to take rollator to [employer] (where they got it from) to get it assessed to see if the brakes can be fixed in order to improve safety and decr pt's fall risk, both verbalized understanding.       Knee/Hip Exercises: Supine   Hip Adduction Isometric  Strengthening;1 set;10 reps    Hip Adduction Isometric Limitations  in hooklying position with ball squeeze, squeezing for 3 seconds and then relaxing    Bridges  Strengthening;1 set;10 reps    Bridges Limitations  cues for technique    Other Supine Knee/Hip Exercises  supine clamshells with green theraband: 1 x 10 reps to the R, 1 x 10 reps to the L, cues for slow and controlled             PT Education - 11/29/19 2059    Education Details  getting pt's rollator brakes looked at by [employer] for inr safety.    Person(s) Educated  Patient;Spouse    Methods  Explanation    Comprehension  Verbalized understanding       PT Short Term Goals - 11/20/19 1710      PT SHORT TERM GOAL #1   Title  Patient will be independent with initial HEP in order to build upon functional gains made in therapy.  ALL STGS DUE 12/13/19    Time  6   due to initial delay in scheduling   Period  Weeks    Target Date  12/13/19      PT SHORT TERM GOAL #2   Title  Patient to improve BERG balance test to at least 32/56 for decreased fall risk.    Time  6    Period  Weeks     Status  Revised      PT SHORT TERM GOAL #3  Title  Patient will decrease TUG time with bariatric rollator to 25 seconds of less to demo decr fall risk.    Baseline  30.22 seconds on 10/31/19    Time  6    Period  Weeks    Status  New      PT SHORT TERM GOAL #4   Title  Patient will improve gait velocity with bariatric rollator to at least 2.1 ft/sec to demo a limited community ambulator and decr fall risk.    Baseline  1.70 ft/sec    Time  6    Period  Weeks    Status  New      PT SHORT TERM GOAL #5   Title  Patient will decrease 5x sit <> stand time to 25 seconds of less from elevated mat table with UE support in order to demo improved functional LE strength.    Baseline  31.06 seconds    Time  6    Period  Weeks    Status  New        PT Long Term Goals - 11/20/19 1711      PT LONG TERM GOAL #1   Title  Patient will be independent with final HEP in order to build upon functional gains made in therapy. ALL LTGS DUE 01/29/20    Time  13   due to delay in scheduling   Period  Weeks    Status  New      PT LONG TERM GOAL #2   Title  Berg score to imporve to at least 40/56 for decreased fall risk.    Time  13    Period  Weeks    Status  Revised      PT LONG TERM GOAL #3   Title  Patient will decrease 5x sit <> stand time to 19 seconds of less from elevated mat table vs. standard chair with UE support in order to demo improved functional LE strength.    Time  13    Period  Weeks    Status  New      PT LONG TERM GOAL #4   Title  Patient will improve gait velocity with bariatric rollator vs. LRAD to at least 2.6 ft/sec to demo improved community mobility.    Time  13    Period  Weeks    Status  New      PT LONG TERM GOAL #5   Title  Patient will decrease TUG time with bariatric rollator vs. LRAD to 19 seconds of less to demo decr fall risk.    Time  13    Period  Weeks    Status  New      PT LONG TERM GOAL #6   Title  Patient will ambulate at least 300' with  supervision over level indoor and outdoor surfaces with LRAD  with supervision in order to improve community mobility.    Time  13    Period  Weeks    Status  New            Plan - 11/29/19 2105    Clinical Impression Statement  Focus of today's skilled session was LE strengthening, standing tolerance, and balance in // bars. Pt tolerated session well - needed intermittent seated and standing rest breaks due to fatigue. Pt fatigued after approx. 8 reps of sit <> stands from higher surface (24")without UE support and had incr difficulty with forward weight shift. Pt wearing L AFO with improved  LLE foot clearance, also educated pt on use of AFO for LLE proprioception. Will continue to progress towards LTGs.    Personal Factors and Comorbidities  Comorbidity 3+;Past/Current Experience;Time since onset of injury/illness/exacerbation    Comorbidities  diabetes, HTN, OSA, morbid obesity.    Examination-Activity Limitations  Squat;Transfers;Locomotion Level;Stairs    Examination-Participation Restrictions  Community Activity    Stability/Clinical Decision Making  Evolving/Moderate complexity    Rehab Potential  Good    PT Frequency  2x / week    PT Duration  12 weeks    PT Treatment/Interventions  ADLs/Self Care Home Management;Balance training;Therapeutic exercise;Therapeutic activities;Functional mobility training;Stair training;Gait training;DME Instruction;Aquatic Therapy;Neuromuscular re-education;Patient/family education;Orthotic Fit/Training;Energy conservation    PT Next Visit Plan  continue sit<>stand from elevated surfaces, step ups, single limb stance and functional lower extremity strengthening; work in parallel bars to decrease UE support    PT Home Exercise Plan  [id number] (Med[employer])    Consulted and Agree with Plan of Care  Patient       Patient will benefit from skilled therapeutic intervention in order to improve the following deficits and impairments:  Abnormal gait, Decreased  activity tolerance, Decreased balance, Decreased coordination, Decreased range of motion, Decreased knowledge of use of DME, Decreased knowledge of precautions, Decreased endurance, Decreased strength, Difficulty walking, Postural dysfunction, Impaired sensation, Obesity, Pain  Visit Diagnosis: Other lack of coordination  Muscle weakness (generalized)  Unsteadiness on feet  Other abnormalities of gait and mobility     Problem List Patient Active Problem List   Diagnosis Date Noted  . Lower extremity edema 11/24/2019  . Difficulty urinating 11/24/2019  . Dupuytren's contracture of left hand 09/20/2019  . Hyperlipidemia 08/24/2019  . Upper and lower extremity pain 08/24/2019  . OSA on CPAP 08/09/2019  . Copious oral secretions 08/09/2019  . Anemia of chronic disease   . Dysphagia due to recent cerebral infarction   . Diabetes mellitus type 2 in obese (HCC)   . Morbid obesity (HCC)   . Status post insertion of percutaneous endoscopic gastrostomy (PEG) tube (HCC)   . History of CVA (cerebrovascular accident) 06/27/2019  . Essential hypertension 06/14/2019  . Renal insufficiency 05/30/2019  . Diplopia   . Chronic fatigue     Drake Leach, PT, DPT  11/29/2019, 9:07 PM  [facility] [address] [place], [place], [address] Phone: [phone]   Fax:  [phone]  Name: Michael Bright MRN: [id number] Date of Birth: [birth year]

## 2019-11-29 NOTE — Patient Instructions (Addendum)
Adjust your mouth open exercise to do with applying a slight bit of resistance with your hand BELOW your chin (not ON your chin) as you open your mouth  Bring your blowing equipment next session so I can adjust it

## 2019-11-30 ENCOUNTER — Telehealth: Payer: Self-pay | Admitting: Primary Care

## 2019-11-30 NOTE — Therapy (Signed)
[facility] [address] [place], [place], [address] Phone: [phone]   Fax:  [phone]  Occupational Therapy Treatment  Patient Details  Name: Michael Bright MRN: [id number] Date of Birth: 1956-02-04 Referring Provider (OT): Dr. Riley Kill   Encounter Date: 11/29/2019  OT End of Session - 11/29/19 1539    Visit Number  5    Number of Visits  25    Date for OT Re-Evaluation  01/24/20    Authorization Type  [employer]    Authorization - Visit Number  5    Authorization - Number of Visits  10    OT Start Time  1536    OT Stop Time  1615    OT Time Calculation (min)  39 min    Activity Tolerance  Patient tolerated treatment well    Behavior During Therapy  [facility] for tasks assessed/performed       Past Medical History:  Diagnosis Date  . Acute CVA (cerebrovascular accident) (HCC) 05/30/2019  . Acute kidney injury (HCC)   . Acute respiratory failure (HCC)   . Aspiration pneumonia (HCC) 06/14/2019  . 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 Diabetes (HCC)   . 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 (CVA) 08/09/2019  . Pneumonia    aspiration pneumonia  . Sepsis with acute renal failure without septic shock (HCC)   . Septic shock (HCC)   . Stroke (HCC)   . Urinary tract infection   . Urinary tract infection without hematuria     Past Surgical History:  Procedure Laterality Date  . CHOLECYSTECTOMY    . IR GASTROSTOMY TUBE MOD SED  07/04/2019    There were no vitals filed for this visit.  Subjective Assessment - 11/30/19 1744    Currently in Pain?  Yes    Pain Score  3     Pain Location  Shoulder    Pain Orientation  Left    Pain Descriptors / Indicators  Aching    Pain Onset  More than a month ago    Pain Frequency  Intermittent    Aggravating Factors   malpositioning    Pain Relieving Factors  repositioning    Pain Onset  More than a month ago              Treatment: Hot  pack applied to left shoulder x 10 mins(no adverse reacations) while pt placed grooved pegs into pegboard, mod difficulty, min v.c to avoid compensation. Hemiglide for low range shoulder flexion and horizontal abduction, min v.c for proper positioning Arm bike x 6 mins level 1 for reciprocal movement. Low range closed chain shoulder flexion with wall, min v.c for shoulder positioning.               OT Short Term Goals - 11/27/19 1347      OT SHORT TERM GOAL #1   Title  I with HEP.    Time  4    Period  Weeks    Status  Achieved    Target Date  11/25/19      OT SHORT TERM GOAL #2   Title  Pt will demonstrate improved fine motor coordination for ADLs as evidenced by decreasing 9 hole peg test score to 50 secs or less    Baseline  RUE 35.41, LUE 57.78    Time  4    Period  Weeks    Status  On-going      OT SHORT TERM GOAL #  3   Title  Pt will demonstrate aiblity to retrievee light weight object at 110* shoulder flexion with LUE.    Time  4    Period  Weeks    Status  On-going      OT SHORT TERM GOAL #4   Title  Pt will perfrom light home management/ basic cooking with supervision.    Time  4    Period  Weeks    Status  New        OT Long Term Goals - 11/27/19 1347      OT LONG TERM GOAL #1   Title  I with updated HEP.    Time  12    Period  Weeks    Status  New      OT LONG TERM GOAL #2   Title  Pt will demonstrate improved LUE fine motor coordination for ADLS as evidenced by decreasing 9 hole peg test score to 42 secs or less.    Time  12    Period  Weeks    Status  New      OT LONG TERM GOAL #3   Title  Pt will retrieve a lightweight object at 120 shoulder flexion with LUE.    Time  12    Period  Weeks    Status  New      OT LONG TERM GOAL #4   Title  Pt will perform mod complex cooking/ home management modified independently    Time  12    Period  Weeks    Status  New      OT LONG TERM GOAL #5   Title  Pt will perfrom all basic ADLS modified  independently.    Time  12    Period  Weeks    Status  Achieved   per pt report for BADLS             Patient will benefit from skilled therapeutic intervention in order to improve the following deficits and impairments:           Visit Diagnosis: Other lack of coordination  Muscle weakness (generalized)  Unsteadiness on feet  Other disturbances of skin sensation    Problem List Patient Active Problem List   Diagnosis Date Noted  . Lower extremity edema 11/24/2019  . Difficulty urinating 11/24/2019  . Dupuytren's contracture of left hand 09/20/2019  . Hyperlipidemia 08/24/2019  . Upper and lower extremity pain 08/24/2019  . OSA on CPAP 08/09/2019  . Copious oral secretions 08/09/2019  . Anemia of chronic disease   . Dysphagia due to recent cerebral infarction   . Diabetes mellitus type 2 in obese ([place])   . Morbid obesity ([place])   . Status post insertion of percutaneous endoscopic gastrostomy (PEG) tube ([place])   . History of CVA (cerebrovascular accident) 06/27/2019  . Essential hypertension 06/14/2019  . Renal insufficiency 05/30/2019  . Diplopia   . Chronic fatigue     Michael Bright 11/30/2019, 5:45 PM  [facility] [address] [place], [place], [address] Phone: [phone]   Fax:  [phone]  Name: Michael Bright MRN: [id number] Date of Birth: 06-01-1956

## 2019-11-30 NOTE — Therapy (Signed)
[facility] [address] [place], [place], [address] Phone: [phone]   Fax:  [phone]  Speech Language Pathology Treatment  Patient Details  Name: Michael Bright MRN: [id number] Date of Birth: September 04, 1956 Referring Provider (SLP): Dr. Riley Kill   Encounter Date: 11/29/2019  End of Session - 11/29/19 1614    Visit Number  5    Number of Visits  17    Date for SLP Re-Evaluation  12/25/19    SLP Start Time  1452    SLP Stop Time   1532    SLP Time Calculation (min)  40 min    Activity Tolerance  Patient tolerated treatment well       Past Medical History:  Diagnosis Date  . Acute CVA (cerebrovascular accident) (HCC) 05/30/2019  . Acute kidney injury (HCC)   . Acute respiratory failure (HCC)   . Aspiration pneumonia (HCC) 06/14/2019  . 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 Diabetes (HCC)   . 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 (CVA) 08/09/2019  . Pneumonia    aspiration pneumonia  . Sepsis with acute renal failure without septic shock (HCC)   . Septic shock (HCC)   . Stroke (HCC)   . Urinary tract infection   . Urinary tract infection without hematuria     Past Surgical History:  Procedure Laterality Date  . CHOLECYSTECTOMY    . IR GASTROSTOMY TUBE MOD SED  07/04/2019    There were no vitals filed for this visit.  Subjective Assessment - 11/29/19 1512    Subjective  Pt states he thinks he feels pharyngeal residual less frequently than at start of ST.    Patient is accompained by:  Family member   wife           ADULT SLP TREATMENT - 11/30/19 0001      General Information   Behavior/Cognition  Alert;Pleasant mood;Cooperative      Treatment Provided   Treatment provided  Dysphagia      Dysphagia Treatment   Treatment Methods  Skilled observation;Compensation strategy training;Patient/caregiver education    Patient observed directly with PO's  Yes    Type of PO's  observed  Nectar-thick liquids    Liquids provided via  Teaspoon    Oral Phase Signs & Symptoms  --   none noted   Pharyngeal Phase Signs & Symptoms  Complaints of residue    Other treatment/comments  Pt with MBSS scheduled on Tuesday 12-05-19. Pt indeicated he performed oral care < 30 minutes prior to POs dueing ST today. With nectar liquids, pt had WNL timing (<2 seconds) for first 16 boluses. After swallow #9, pt had more freqeunt audible swalows. After swallow #11, pt took 8-second break due to incr'd fatigue. After swallow #18 pt refused further nectar-thick water due to taste. Pt reporrted very minimal nasal regurgitation after swallow #16 (none in #17 or #18 reported). Pt performed 4 reps of mouth open exercise - SLP suggested modifying this to make it more like jaw open aginst resistance (JOAR). He told SLP that he is now keeping cheeks tight with his EMST trials during the day. SLP told pt to bring the device to the next session and SLP to reconfigure device to return to a more challenging setting now that he is performing the exercise correctly without puffed cheeks and tight lips. With CTAR exercise, pt told SLP he presses basketball to chin for 5 minutes x3.      Assessment / Recommendations /  Plan   Plan  Continue with current plan of care      Progression Toward Goals   Progression toward goals  Progressing toward goals       SLP Education - 11/29/19 1613    Education Details  HEPaddition of jaw opening against resistance    Person(s) Educated  Patient;Spouse    Methods  Explanation;Demonstration;Verbal cues    Comprehension  Verbalized understanding;Returned demonstration;Verbal cues required;Need further instruction       SLP Short Term Goals - 11/27/19 1455      SLP SHORT TERM GOAL #1   Title  Pt will complete dysphagia HEP with rare min A x 3 sessions.    Time  3    Period  Weeks    Status  On-going      SLP SHORT TERM GOAL #2   Title  Patient will tolerate electrode  placement for NMES x 3 sessions.    Time  3    Period  Weeks    Status  On-going      SLP SHORT TERM GOAL #3   Title  Patient will tell SLP 3 s/sx of aspiration PNA.    Time  --    Period  --    Status  Achieved       SLP Long Term Goals - 11/27/19 1456      SLP LONG TERM GOAL #1   Title  Pt will demonstrate dysphagia HEP with modified independence x3 sessions.    Time  7    Period  Weeks    Status  On-going      SLP LONG TERM GOAL #2   Title  Bseline  Pt will report thorough oral care prior to water/ice chips x6 sessions.  11-29-19   Time  7    Period  Weeks    Status  On-going         Patient will benefit from skilled therapeutic intervention in order to improve the following deficits and impairments:   No diagnosis found.    Problem List Patient Active Problem List   Diagnosis Date Noted  . Lower extremity edema 11/24/2019  . Difficulty urinating 11/24/2019  . Dupuytren's contracture of left hand 09/20/2019  . Hyperlipidemia 08/24/2019  . Upper and lower extremity pain 08/24/2019  . OSA on CPAP 08/09/2019  . Copious oral secretions 08/09/2019  . Anemia of chronic disease   . Dysphagia due to recent cerebral infarction   . Diabetes mellitus type 2 in obese ([place])   . Morbid obesity ([place])   . Status post insertion of percutaneous endoscopic gastrostomy (PEG) tube ([place])   . History of CVA (cerebrovascular accident) 06/27/2019  . Essential hypertension 06/14/2019  . Renal insufficiency 05/30/2019  . Diplopia   . Chronic fatigue     Sherece Gambrill ,MS, CCC-SLP  11/30/2019, 1:17 PM  [facility] [address] [place], [place], [address] Phone: [phone]   Fax:  [phone]   Name: Florentino Laabs MRN: [id number] Date of Birth: 1956-07-30

## 2019-11-30 NOTE — Telephone Encounter (Signed)
Called [employer] [facility] where patient is receiving his Osmolite Nutrition from. Montine Circle is the Registered Dietician there who provides Michael Bright with his Osmolite formula. I explained to her that the patient is having difficulty losing weight and would like to try to. Selena Batten will call and speak to the patient and his wife about deceasing the amount of formula he is receiving. Once they agree that this is what they want to do, she will fax an order to Vernona Rieger the PCP to sign and fax back to her then  it can be done. Montine Circle phone number is [phone] Option#1 Nutrition Care Team.

## 2019-12-05 ENCOUNTER — Ambulatory Visit: Payer: BC Managed Care – PPO | Admitting: Occupational Therapy

## 2019-12-05 ENCOUNTER — Other Ambulatory Visit: Payer: Self-pay

## 2019-12-05 ENCOUNTER — Ambulatory Visit (HOSPITAL_COMMUNITY)
Admission: RE | Admit: 2019-12-05 | Discharge: 2019-12-05 | Disposition: A | Discharge status: Home / Self Care | Payer: BC Managed Care – PPO | Source: Ambulatory Visit | Attending: Physical Medicine & Rehabilitation | Admitting: Physical Medicine & Rehabilitation

## 2019-12-05 ENCOUNTER — Ambulatory Visit: Payer: BC Managed Care – PPO

## 2019-12-05 ENCOUNTER — Ambulatory Visit: Payer: BC Managed Care – PPO | Admitting: Physical Therapy

## 2019-12-05 DIAGNOSIS — M6281 Muscle weakness (generalized): Secondary | ICD-10-CM

## 2019-12-05 DIAGNOSIS — R1310 Dysphagia, unspecified: Secondary | ICD-10-CM | POA: Diagnosis present

## 2019-12-05 DIAGNOSIS — R1312 Dysphagia, oropharyngeal phase: Secondary | ICD-10-CM

## 2019-12-05 DIAGNOSIS — R2681 Unsteadiness on feet: Secondary | ICD-10-CM

## 2019-12-05 DIAGNOSIS — R278 Other lack of coordination: Secondary | ICD-10-CM | POA: Diagnosis not present

## 2019-12-05 DIAGNOSIS — R2689 Other abnormalities of gait and mobility: Secondary | ICD-10-CM

## 2019-12-05 DIAGNOSIS — R1313 Dysphagia, pharyngeal phase: Secondary | ICD-10-CM

## 2019-12-05 NOTE — Patient Instructions (Signed)
   Swallow and Squeeze (Mendelsohn swallow) - swallow your saliva and squeeze tight 5-7 seconds as your saliva moves down your throat - repeat 10-15 times, twice to 3 times/day - if you need a drop of water to swallow that is ok (you might get dry)

## 2019-12-05 NOTE — Therapy (Signed)
[facility] [address] [place], [place], [address] Phone: [phone]   Fax:  [phone]  Occupational Therapy Treatment  Patient Details  Name: Michael Bright MRN: [id number] Date of Birth: [birth year] Referring Provider (OT): Dr. Naaman Plummer   Encounter Date: 12/05/2019  OT End of Session - 12/05/19 1401    Visit Number  6    Number of Visits  25    Date for OT Re-Evaluation  01/24/20    Authorization Type  [place]    Authorization - Visit Number  6    Authorization - Number of Visits  10    OT Start Time  1320    OT Stop Time  1400    OT Time Calculation (min)  40 min    Activity Tolerance  Patient tolerated treatment well    Behavior During Therapy  [facility] for tasks assessed/performed       Past Medical History:  Diagnosis Date  . Acute CVA (cerebrovascular accident) ([place]) 05/30/2019  . Acute kidney injury ([place])   . Acute respiratory failure ([place])   . Aspiration pneumonia ([place]) 06/14/2019  . 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 Diabetes ([place])   . 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 (CVA) 08/09/2019  . Pneumonia    aspiration pneumonia  . Sepsis with acute renal failure without septic shock ([place])   . Septic shock ([place])   . Stroke ([place])   . Urinary tract infection   . Urinary tract infection without hematuria     Past Surgical History:  Procedure Laterality Date  . CHOLECYSTECTOMY    . IR GASTROSTOMY TUBE MOD SED  07/04/2019    There were no vitals filed for this visit.  Subjective Assessment - 12/05/19 1326    Subjective   I just got back from the swallowing test (MBS); I was instructed to drink more liquids    Patient is accompanied by:  Family member   wife   Pertinent History  Michael Bright is a 64 y.o. 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 He was readmitted on 07/20/19 with septic shock due to Klebsiella pneumoniae bacteremia with UTI and hypoxic respiratory failure bilateral lung infiltrates due to rhino virus. He was intubated briefly from 10/1-10/2 and treated with broad spectrum antibiotics till cultures finalized. Antibiotics narrowed to cefdinir on 10/5 with recommendations to complete 2 weeks total antibiotic course.  Hospital course significant for AKI with abnormal LFTs due to shocked liver, intermittent hypoxic episode as well as significant oral secretions with dysphonia. Discharged home 08/08/19.    Limitations  NPO, feeding tube    Patient Stated Goals  improve coordination and walking    Currently in Pain?  No/denies        Standing: placing large pegs in pegboard 1 at a time LUE (5 rows) then removing up to 3 for in hand manipulation and fingertip to/from palm translation.  Standing w/ 1 lb wrist weight on LUE to retrieve and replace cones for overhead shelf; then progressed to screwing/unscrewing nuts/bolts for sustained overhead sh flexion. Closed chain high level sh flexion using UE Ranger w/ 1 lb weight for high level flexion w/ min cues for proper positionin UBE x 5 min. Level 5 resistance for UB conditioning  OT Short Term Goals - 11/27/19 1347      OT SHORT TERM GOAL #1   Title  I with HEP.    Time  4    Period  Weeks    Status  Achieved    Target Date  11/25/19      OT SHORT TERM GOAL #2   Title  Pt will demonstrate improved fine motor coordination for ADLs as evidenced by decreasing 9 hole peg test score to 50 secs or less    Baseline  RUE 35.41, LUE 57.78    Time  4    Period  Weeks    Status  On-going      OT SHORT TERM GOAL #3   Title  Pt will demonstrate aiblity to retrievee light weight object at 110* shoulder  flexion with LUE.    Time  4    Period  Weeks    Status  On-going      OT SHORT TERM GOAL #4   Title  Pt will perfrom light home management/ basic cooking with supervision.    Time  4    Period  Weeks    Status  New        OT Long Term Goals - 11/27/19 1347      OT LONG TERM GOAL #1   Title  I with updated HEP.    Time  12    Period  Weeks    Status  New      OT LONG TERM GOAL #2   Title  Pt will demonstrate improved LUE fine motor coordination for ADLS as evidenced by decreasing 9 hole peg test score to 42 secs or less.    Time  12    Period  Weeks    Status  New      OT LONG TERM GOAL #3   Title  Pt will retrieve a lightweight object at 120 shoulder flexion with LUE.    Time  12    Period  Weeks    Status  New      OT LONG TERM GOAL #4   Title  Pt will perform mod complex cooking/ home management modified independently    Time  12    Period  Weeks    Status  New      OT LONG TERM GOAL #5   Title  Pt will perfrom all basic ADLS modified independently.    Time  12    Period  Weeks    Status  Achieved   per pt report for BADLS           Plan - 12/05/19 1402    Clinical Impression Statement  Pt progressing with LUE strength/endurance and coordination.    Occupational performance deficits (Please refer to evaluation for details):  ADL's;IADL's;Leisure;Social Participation    Body Structure / Function / Physical Skills  ADL;UE functional use;Flexibility;Pain;FMC;ROM;Coordination;GMC;Sensation;Decreased knowledge of precautions;Decreased knowledge of use of DME;IADL;Dexterity;Strength;Mobility;Endurance;Balance;Vision    Rehab Potential  Good    OT Frequency  2x / week    OT Duration  12 weeks    OT Treatment/Interventions  Self-care/ADL training;Aquatic Therapy;DME and/or AE instruction;Patient/family education;Balance training;Passive range of motion;Gait Training;Paraffin;Cryotherapy;Fluidtherapy;Splinting;Building services engineer;Contrast  Bath;Electrical Stimulation;Moist Heat;Therapeutic exercise;Manual Therapy;Therapeutic activities;Neuromuscular education    Plan  check remaining STG's and progress towards LTG's    Consulted and Agree with Plan of Care  Patient;Family member/caregiver    Family Member Consulted  wife       Patient will  benefit from skilled therapeutic intervention in order to improve the following deficits and impairments:   Body Structure / Function / Physical Skills: ADL, UE functional use, Flexibility, Pain, FMC, ROM, Coordination, GMC, Sensation, Decreased knowledge of precautions, Decreased knowledge of use of DME, IADL, Dexterity, Strength, Mobility, Endurance, Balance, Vision       Visit Diagnosis: Muscle weakness (generalized)  Other lack of coordination  Unsteadiness on feet    Problem List Patient Active Problem List   Diagnosis Date Noted  . Lower extremity edema 11/24/2019  . Difficulty urinating 11/24/2019  . Dupuytren's contracture of left hand 09/20/2019  . Hyperlipidemia 08/24/2019  . Upper and lower extremity pain 08/24/2019  . OSA on CPAP 08/09/2019  . Copious oral secretions 08/09/2019  . Anemia of chronic disease   . Dysphagia due to recent cerebral infarction   . Diabetes mellitus type 2 in obese (HCC)   . Morbid obesity (HCC)   . Status post insertion of percutaneous endoscopic gastrostomy (PEG) tube (HCC)   . History of CVA (cerebrovascular accident) 06/27/2019  . Essential hypertension 06/14/2019  . Renal insufficiency 05/30/2019  . Diplopia   . Chronic fatigue     Kelli Churn, OTR/L 12/05/2019, 2:10 PM  [facility] [address] [place], [place], [address] Phone: [phone]   Fax:  [phone]  Name: Michael Bright MRN: [id number] Date of Birth: January 07, 1956

## 2019-12-05 NOTE — Therapy (Signed)
[facility] [address] [place], [place], [address] Phone: [phone]   Fax:  [phone]  Speech Language Pathology Treatment  Patient Details  Name: Michael Bright MRN: [id number] Date of Birth: 1956-07-23 Referring Provider (SLP): Dr. Riley Kill   Encounter Date: 12/05/2019  End of Session - 12/05/19 1529    Visit Number  6    Number of Visits  17    Date for SLP Re-Evaluation  12/25/19    Authorization Type  BCBS Primary, Medicare secondary    SLP Start Time  1404    SLP Stop Time   1445    SLP Time Calculation (min)  41 min    Activity Tolerance  Patient tolerated treatment well       Past Medical History:  Diagnosis Date  . Acute CVA (cerebrovascular accident) (HCC) 05/30/2019  . Acute kidney injury (HCC)   . Acute respiratory failure (HCC)   . Aspiration pneumonia (HCC) 06/14/2019  . 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 Diabetes (HCC)   . 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 (CVA) 08/09/2019  . Pneumonia    aspiration pneumonia  . Sepsis with acute renal failure without septic shock (HCC)   . Septic shock (HCC)   . Stroke (HCC)   . Urinary tract infection   . Urinary tract infection without hematuria     Past Surgical History:  Procedure Laterality Date  . CHOLECYSTECTOMY    . IR GASTROSTOMY TUBE MOD SED  07/04/2019    There were no vitals filed for this visit.  Subjective Assessment - 12/05/19 1424    Subjective  SLP performing MBSS talked with SLP on phone prior to pt appointment.    Patient is accompained by:  Family member    Currently in Pain?  No/denies            ADULT SLP TREATMENT - 12/05/19 1523      General Information   Behavior/Cognition  Alert;Pleasant mood;Cooperative      Treatment Provided   Treatment provided  Dysphagia      Dysphagia Treatment   Treatment Methods  Skilled observation;Therapeutic exercise;Patient/caregiver education    Patient observed directly with PO's  No    Other treatment/comments  Pt with MBSS earlier today. Still NPO recommended - bolus retention in pyriforms was significant; pt with decr'd pharyngeal wall squeezing/narrowing, reduced epiglottic deflection, and decr'd UES opening. NO aspiration noted. It was suggested pt lie in supine/reclined position and take liquids. Pt shaved today and NMES was utilized with placement 3a (at 17.0 mA) with pt performing [place] with occasional mod cues faded to occasional min A. Given need for improved epiglottic deflection and for UEP opening, Michael Bright was taught to pt today and he was told to add this to his HEP regimen.       Assessment / Recommendations / Plan   Plan  --   may need to incr to x3/week if NMES utilized     Progression Toward Goals   Progression toward goals  Progressing toward goals         SLP Short Term Goals - 12/05/19 1537      SLP SHORT TERM GOAL #1   Title  Pt will complete dysphagia HEP with rare min A x 3 sessions.    Time  2    Period  Weeks    Status  On-going      SLP SHORT TERM GOAL #2   Title  Patient will tolerate  electrode placement for NMES x 3 sessions.    Baseline  12-05-19    Time  2    Period  Weeks    Status  On-going      SLP SHORT TERM GOAL #3   Title  Patient will tell SLP 3 s/sx of aspiration PNA.    Status  Achieved       SLP Long Term Goals - 12/05/19 1538      SLP LONG TERM GOAL #1   Title  Pt will demonstrate dysphagia HEP with modified independence x3 sessions.    Time  6    Period  Weeks    Status  On-going      SLP LONG TERM GOAL #2   Title  Pt will report thorough oral care prior to water/ice chips x6 sessions.    Baseline  11-29-19    Time  6    Period  Weeks    Status  On-going       Plan - 12/05/19 1530    Clinical Impression Statement  Michael Bright again enters ST room with clear voice - no hydrophonia present.  He continues PEG dependent since hospitalization. Today he had a  follow up  MBSS - see "skilled treatment", or evaluation in "imaging" for more details on that exam. Pt was taught  He has been doing nectar trials at home as well since last ST visit. He has also been taking sips of regular water and ice chips after oral care at home as well. Patient has been completing pharyngeal exercises and RMT (expiratory) with improvement in function noted on previous MBS (10-18-19). If pt desires to do NMES solely, he will need a recert for x3week ST. I recommend cont'd skilled ST to address dysphagia for training in aspiration precautions, pharyngeal exercises, and possibly NMES to improve pt's swallow function for possible advancement to comfort feeds. Advise nectar thick liquid trials with SLP only at this time; continue free water protocol at home after rigorous oral care.    Speech Therapy Frequency  2x / week    Duration  --   8 weeks or 17 visits   Treatment/Interventions  Aspiration precaution training;Cueing hierarchy;SLP instruction and feedback;Pharyngeal strengthening exercises;NMES;Compensatory techniques;Diet toleration management by SLP;Trials of upgraded texture/liquids;Patient/family education    Potential to Achieve Goals  Good    Potential Considerations  Severity of impairments    SLP Home Exercise Plan  Continue swallow ex and RMT per [facility] SLP; bring exercises to next session    Consulted and Agree with Plan of Care  Patient;Family member/caregiver       Patient will benefit from skilled therapeutic intervention in order to improve the following deficits and impairments:   Dysphagia, pharyngeal phase    Problem List Patient Active Problem List   Diagnosis Date Noted  . Lower extremity edema 11/24/2019  . Difficulty urinating 11/24/2019  . Dupuytren's contracture of left hand 09/20/2019  . Hyperlipidemia 08/24/2019  . Upper and lower extremity pain 08/24/2019  . OSA on CPAP 08/09/2019  . Copious oral secretions 08/09/2019  . Anemia of chronic  disease   . Dysphagia due to recent cerebral infarction   . Diabetes mellitus type 2 in obese (HCC)   . Morbid obesity (HCC)   . Status post insertion of percutaneous endoscopic gastrostomy (PEG) tube (HCC)   . History of CVA (cerebrovascular accident) 06/27/2019  . Essential hypertension 06/14/2019  . Renal insufficiency 05/30/2019  . Diplopia   . Chronic fatigue  [facility], [place]  12/05/2019, 3:40 PM  [facility] [address] [place], [place], [address] Phone: [phone]   Fax:  [phone]   Name: Javien Tesch MRN: [id number] Date of Birth: Jan 08, 1956

## 2019-12-06 NOTE — Progress Notes (Signed)
SPEECH PATHOLOGY: LATE ENTRY FROM 10/03/20 MBS  12/05/19 1300  SLP Visit Information  SLP Received On 12/05/19  Subjective  Subjective participatory  General Information  Date of Onset 05/28/19  HPI Michael Bright is a 64 y.o. male with hx of myotonic dystrophy 2 (DM2) and diabetes who experienced a left medullary stroke (lateral medullary syndrome/[place] syndrome) on 05/30/19.  Hospital course was marked by severe oropharyngeal dysphagia, respiratory distress with intubation, trach/PEG. Pt has participated in intensive dysphagia therapy at inpatient rehab, through [facility] services, and now at [facility] OP center.  He has had multiple MBS studies, each of which has shown gradual improvements but have been characterized primarily by frank aspiration with reduced UES relaxation and impaired mobility of the laryngeal structures.  Sensation has generally been spared given location of stroke.    Type of Study MBS-Modified Barium Swallow Study  Previous Swallow Assessment see HPI  Diet Prior to this Study Nectar-thick liquids;PEG tube  Temperature Spikes Noted No  Respiratory Status Room air  History of Recent Intubation No  Behavior/Cognition Alert;Cooperative;Pleasant mood  Oral Cavity Assessment WFL  Oral Cavity - Dentition Adequate natural dentition  Vision Functional for self feeding  Self-Feeding Abilities Able to feed self  Patient Positioning Upright in chair;Other (comment) (supine in chair)  Baseline Vocal Quality Normal  Volitional Cough Strong  Volitional Swallow Able to elicit  Anatomy [facility]  Pharyngeal Secretions Not observed secondary MBS  Oral Motor/Sensory Function  Overall Oral Motor/Sensory Function WFL  Oral Preparation/Oral Phase  Oral Phase WFL  Pharyngeal Phase  Pharyngeal Phase Impaired  Pharyngeal - Honey  Pharyngeal- Honey Cup NT  Pharyngeal - Nectar  Pharyngeal- Nectar Cup NT  Pharyngeal - Thin  Pharyngeal- Thin Cup Delayed swallow initiation-pyriform  sinuses;Reduced pharyngeal peristalsis;Reduced epiglottic inversion;Pharyngeal residue - pyriform;Delayed swallow initiation-vallecula  Pharyngeal Material enters airway, remains ABOVE vocal cords then ejected out;Material does not enter airway  Pharyngeal- Thin Straw Delayed swallow initiation-pyriform sinuses;Reduced pharyngeal peristalsis;Reduced epiglottic inversion;Pharyngeal residue - pyriform  Pharyngeal - Solids  Pharyngeal- Puree NT  Clinical Impression  Clinical Impression Pt continues to present with pharyngeal dysphagia, however there are some notable improvements in swallow function since [facility] 10/18/19.  Oral control, bolus cohesion are WNL.  There is improved laryngeal elevation and anterior movement. Pharyngeal squeeze remains impaired, contributing to limited epiglottic deflection over the larynx.  Poor UES opening/relaxation remains one of the primary impairments.  A portion of the thin liquid boluses passes through the UES, but the majority remains in the hypopharynx. Fortunately, the pyriform sinuses are large enough to contain the liquids without concern for spillover into the larynx.  There was no aspiration noted on today's exam; penetration, if it occurred, was difficult to detect upon frame-by-frame review.  In an effort to improve bolus flow, pt was tested with head turns to left and right, which was ineffective.  Supine positioning appeared to facilitate greater flow through through the UES.  Pt reported improvement.  Recommend ongoing OP SLP.  Pt may begin trials of thin liquids in supine per his comfort level.  Fortunately, given location of stroke, pt's sensation is preserved and he is able to discern presence of aspiration and success of bolus movement. Additionally, given his improved mobility and absence of any pna in the last year, aspiration, if it occurs, without other co-morbidities is less likely to lead to a pna.  MBS was viewed and all aforementioned information was  discussed with pt and his wife.   Results were also discussed  with [name], OP SLP.   SLP Visit Diagnosis Dysphagia, pharyngeal phase (R13.13)  Impact on safety and function Mild aspiration risk  Swallow Evaluation Recommendations  SLP Diet Recommendations Thin liquid  Liquid Administration via Straw;Cup  Medication Administration Via alternative means  Compensations Other (Comment) (supine drinking thin liquids)  Treatment Plan  Oral Care Recommendations Oral care BID  Treatment Recommendations Defer treatment plan to f/u with SLP  Follow up Recommendations Outpatient SLP  Individuals Consulted  Consulted and Agree with Results and Recommendations Patient;Family member/caregiver  Family Member Consulted wife  Report Sent to  Primary SLP  SLP Time Calculation  SLP Start Time (ACUTE ONLY) 1145  SLP Stop Time (ACUTE ONLY) 1245  SLP Time Calculation (min) (ACUTE ONLY) 60 min  SLP Evaluations  $ SLP Speech Visit 1 Visit  SLP Evaluations  $Outpatient MBS Swallow 1 Procedure  [name] L. Tivis Ringer, [place] Office number [phone] Pager [phone]

## 2019-12-06 NOTE — Therapy (Signed)
[facility] [address] [place], [place], [address] Phone: [phone]   Fax:  [phone]  Physical Therapy Treatment  Patient Details  Name: Michael Bright MRN: [id number] Date of Birth: 23-May-1956 Referring Provider (PT): Faith Rogue, MD   Encounter Date: 12/05/2019  PT End of Session - 12/06/19 1209    Visit Number  6    Number of Visits  25    Date for PT Re-Evaluation  01/29/20    Authorization Type  BCBS - 2021 primary, Medicare Secondary    PT Start Time  1448    PT Stop Time  1529    PT Time Calculation (min)  41 min    Equipment Utilized During Treatment  Gait belt    Activity Tolerance  Patient tolerated treatment well    Behavior During Therapy  [facility] for tasks assessed/performed       Past Medical History:  Diagnosis Date  . Acute CVA (cerebrovascular accident) (HCC) 05/30/2019  . Acute kidney injury (HCC)   . Acute respiratory failure (HCC)   . Aspiration pneumonia (HCC) 06/14/2019  . 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 Diabetes (HCC)   . 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 (CVA) 08/09/2019  . Pneumonia    aspiration pneumonia  . Sepsis with acute renal failure without septic shock (HCC)   . Septic shock (HCC)   . Stroke (HCC)   . Urinary tract infection   . Urinary tract infection without hematuria     Past Surgical History:  Procedure Laterality Date  . CHOLECYSTECTOMY    . IR GASTROSTOMY TUBE MOD SED  07/04/2019    There were no vitals filed for this visit.  Subjective Assessment - 12/05/19 1452    Subjective  Had his swallowing test this morning. Not wearing his AFO today. Hasn't yet brought his rollator in to have the brakes looked at.    Pertinent History  PMH: CVA (L lateral medullary syndrome) diabetes, HTN, OSA, morbid obesity.    Limitations  Standing;Walking;House hold activities    How long can you stand comfortably?  can stand approx. 10 minutes     How long can you walk comfortably?  20 minutes with the rollator    Patient Stated Goals  wants to walk better, walk more steady, wants to be able to get up more easily.    Currently in Pain?  Yes    Pain Score  5     Pain Location  Hip   thigh   Pain Orientation  Left    Pain Descriptors / Indicators  Tightness   muscle tightness   Pain Type  Acute pain   started today with walking   Pain Onset  More than a month ago    Aggravating Factors   walking    Pain Relieving Factors  sitting    Pain Onset  More than a month ago                       [facility] Adult PT Treatment/Exercise - 12/05/19 1516      Transfers   Transfers  Sit to Stand;Stand to Sit    Sit to Stand  5: Supervision;With upper extremity assist    Sit to Stand Details  Verbal cues for sequencing;Verbal cues for technique    Sit to Stand Details (indicate cue type and reason)  cues for forward weight shift    Comments  performed throughout session from mat  table and bariatric chair       Ambulation/Gait   Ambulation/Gait  Yes    Ambulation/Gait Assistance  5: Supervision    Ambulation/Gait Assistance Details  pt not wearing L AFO today - cues for heel > toe pattern     Ambulation Distance (Feet)  115 Feet    Assistive device  Rollator    Gait Pattern  Step-through pattern;Decreased stride length;Decreased dorsiflexion - left;Trunk flexed;Poor foot clearance - left;Decreased step length - right;Decreased dorsiflexion - right;Decreased weight shift to left    Ambulation Surface  Level;Indoor      High Level Balance   High Level Balance Comments  in // bars with BUE support: staggered stance A/P weight shifting, cues for posture and weight shift 1 x 15 reps B. with BUE support: and 2# ankle weight on LLE for strengthening and proprioception 1 x 10 reps stepping LLE over black foam at lowest height - weight shifting it forward and then stepping it backwards       Exercises   Exercises  Other Exercises     Other Exercises   modified thomas test supine hip flexor stretch with pt on wedge: 3 x 45 second bouts with pt assisting with  proper LLE positioning off mat table      Knee/Hip Exercises: Standing   Forward Step Up  Left;Both;2 sets;10 reps;Hand Hold: 2;Step Height: 6"    Forward Step Up Limitations  with 2 # ankle weight for proprioception to L ankle, floating RLE in air, cues to keep soft bend in L knee    Other Standing Knee Exercises  at staircase with single UE support: alternating SLS taps to 6" step 2 x 10 reps B, cues for slowed and controlled, 2# ankle weight on LLE                PT Short Term Goals - 11/20/19 1710      PT SHORT TERM GOAL #1   Title  Patient will be independent with initial HEP in order to build upon functional gains made in therapy.  ALL STGS DUE 12/13/19    Time  6   due to initial delay in scheduling   Period  Weeks    Target Date  12/13/19      PT SHORT TERM GOAL #2   Title  Patient to improve BERG balance test to at least 32/56 for decreased fall risk.    Time  6    Period  Weeks    Status  Revised      PT SHORT TERM GOAL #3   Title  Patient will decrease TUG time with bariatric rollator to 25 seconds of less to demo decr fall risk.    Baseline  30.22 seconds on 10/31/19    Time  6    Period  Weeks    Status  New      PT SHORT TERM GOAL #4   Title  Patient will improve gait velocity with bariatric rollator to at least 2.1 ft/sec to demo a limited community ambulator and decr fall risk.    Baseline  1.70 ft/sec    Time  6    Period  Weeks    Status  New      PT SHORT TERM GOAL #5   Title  Patient will decrease 5x sit <> stand time to 25 seconds of less from elevated mat table with UE support in order to demo improved functional LE strength.    Baseline  31.06 seconds    Time  6    Period  Weeks    Status  New        PT Long Term Goals - 11/20/19 1711      PT LONG TERM GOAL #1   Title  Patient will be independent with final  HEP in order to build upon functional gains made in therapy. ALL LTGS DUE 01/29/20    Time  13   due to delay in scheduling   Period  Weeks    Status  New      PT LONG TERM GOAL #2   Title  Berg score to imporve to at least 40/56 for decreased fall risk.    Time  13    Period  Weeks    Status  Revised      PT LONG TERM GOAL #3   Title  Patient will decrease 5x sit <> stand time to 19 seconds of less from elevated mat table vs. standard chair with UE support in order to demo improved functional LE strength.    Time  13    Period  Weeks    Status  New      PT LONG TERM GOAL #4   Title  Patient will improve gait velocity with bariatric rollator vs. LRAD to at least 2.6 ft/sec to demo improved community mobility.    Time  13    Period  Weeks    Status  New      PT LONG TERM GOAL #5   Title  Patient will decrease TUG time with bariatric rollator vs. LRAD to 19 seconds of less to demo decr fall risk.    Time  13    Period  Weeks    Status  New      PT LONG TERM GOAL #6   Title  Patient will ambulate at least 300' with supervision over level indoor and outdoor surfaces with LRAD  with supervision in order to improve community mobility.    Time  13    Period  Weeks    Status  New            Plan - 12/06/19 1210    Clinical Impression Statement  Focus of today's skilled session was LLE ROM, standing tolerance, weight shifting and LE strengthening. Pt needed intermittent breaks due to fatigue. Pt reporting increased L anterior hip pain at start of session after walking today, after performing L supine hip flexor stretch pt reporting no pain. Will continue to progress towards LTGs.    Personal Factors and Comorbidities  Comorbidity 3+;Past/Current Experience;Time since onset of injury/illness/exacerbation    Comorbidities  diabetes, HTN, OSA, morbid obesity.    Examination-Activity Limitations  Squat;Transfers;Locomotion Level;Stairs    Examination-Participation Restrictions   Community Activity    Stability/Clinical Decision Making  Evolving/Moderate complexity    Rehab Potential  Good    PT Frequency  2x / week    PT Duration  12 weeks    PT Treatment/Interventions  ADLs/Self Care Home Management;Balance training;Therapeutic exercise;Therapeutic activities;Functional mobility training;Stair training;Gait training;DME Instruction;Aquatic Therapy;Neuromuscular re-education;Patient/family education;Orthotic Fit/Training;Energy conservation    PT Next Visit Plan  continue sit<>stand from elevated surfaces, step ups, single limb stance and functional lower extremity strengthening; work in parallel bars to decrease UE support, ankle weight on LLE for incr proprioception    PT Home Exercise Plan  [email or web address] ([employer])    Consulted and Agree with Plan of Care  Patient  Patient will benefit from skilled therapeutic intervention in order to improve the following deficits and impairments:  Abnormal gait, Decreased activity tolerance, Decreased balance, Decreased coordination, Decreased range of motion, Decreased knowledge of use of DME, Decreased knowledge of precautions, Decreased endurance, Decreased strength, Difficulty walking, Postural dysfunction, Impaired sensation, Obesity, Pain  Visit Diagnosis: Other lack of coordination  Muscle weakness (generalized)  Unsteadiness on feet  Other abnormalities of gait and mobility     Problem List Patient Active Problem List   Diagnosis Date Noted  . Lower extremity edema 11/24/2019  . Difficulty urinating 11/24/2019  . Dupuytren's contracture of left hand 09/20/2019  . Hyperlipidemia 08/24/2019  . Upper and lower extremity pain 08/24/2019  . OSA on CPAP 08/09/2019  . Copious oral secretions 08/09/2019  . Anemia of chronic disease   . Dysphagia due to recent cerebral infarction   . Diabetes mellitus type 2 in obese ([place])   . Morbid obesity ([place])   . Status post insertion of percutaneous endoscopic gastrostomy  (PEG) tube ([place])   . History of CVA (cerebrovascular accident) 06/27/2019  . Essential hypertension 06/14/2019  . Renal insufficiency 05/30/2019  . Diplopia   . Chronic fatigue     Arliss Journey, PT, DPT  12/06/2019, 12:15 PM  [facility] [address] [place], [place], [address] Phone: [phone]   Fax:  [phone]  Name: Ashwath Lasch MRN: [id number] Date of Birth: 1956-04-26

## 2019-12-08 ENCOUNTER — Ambulatory Visit: Payer: BC Managed Care – PPO

## 2019-12-08 ENCOUNTER — Ambulatory Visit: Payer: BC Managed Care – PPO | Admitting: Physical Therapy

## 2019-12-08 ENCOUNTER — Encounter: Payer: Self-pay | Admitting: Occupational Therapy

## 2019-12-08 ENCOUNTER — Ambulatory Visit: Payer: BC Managed Care – PPO | Admitting: Occupational Therapy

## 2019-12-08 ENCOUNTER — Other Ambulatory Visit: Payer: Self-pay

## 2019-12-08 DIAGNOSIS — R2681 Unsteadiness on feet: Secondary | ICD-10-CM

## 2019-12-08 DIAGNOSIS — M6281 Muscle weakness (generalized): Secondary | ICD-10-CM

## 2019-12-08 DIAGNOSIS — R2689 Other abnormalities of gait and mobility: Secondary | ICD-10-CM

## 2019-12-08 DIAGNOSIS — R1312 Dysphagia, oropharyngeal phase: Secondary | ICD-10-CM

## 2019-12-08 DIAGNOSIS — R278 Other lack of coordination: Secondary | ICD-10-CM

## 2019-12-08 DIAGNOSIS — R208 Other disturbances of skin sensation: Secondary | ICD-10-CM

## 2019-12-08 NOTE — Therapy (Signed)
[facility] [address] [place], [place], [address] Phone: [phone]   Fax:  [phone]  Speech Language Pathology Treatment  Patient Details  Name: Michael Bright MRN: [id number] Date of Birth: 1956-08-02 Referring Provider (SLP): Dr. Riley Kill   Encounter Date: 12/08/2019  End of Session - 12/08/19 1614    Visit Number  7    Number of Visits  25    Date for SLP Re-Evaluation  02/16/20    SLP Start Time  1532    SLP Stop Time   1607    SLP Time Calculation (min)  35 min    Activity Tolerance  Patient tolerated treatment well       Past Medical History:  Diagnosis Date  . Acute CVA (cerebrovascular accident) (HCC) 05/30/2019  . Acute kidney injury (HCC)   . Acute respiratory failure (HCC)   . Aspiration pneumonia (HCC) 06/14/2019  . 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 Diabetes (HCC)   . 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 (CVA) 08/09/2019  . Pneumonia    aspiration pneumonia  . Sepsis with acute renal failure without septic shock (HCC)   . Septic shock (HCC)   . Stroke (HCC)   . Urinary tract infection   . Urinary tract infection without hematuria     Past Surgical History:  Procedure Laterality Date  . CHOLECYSTECTOMY    . IR GASTROSTOMY TUBE MOD SED  07/04/2019    There were no vitals filed for this visit.         ADULT SLP TREATMENT - 12/08/19 1538      General Information   Behavior/Cognition  Alert;Pleasant mood;Cooperative      Treatment Provided   Treatment provided  Dysphagia      Dysphagia Treatment   Treatment Methods  Skilled observation;Therapeutic exercise;Patient/caregiver education    Patient observed directly with PO's  Yes    Type of PO's observed  Nectar-thick liquids    Liquids provided via  Cup    Pharyngeal Phase Signs & Symptoms  Complaints of residue    Other treatment/comments  SLP explained that in order to use NMES as modality in ST  we will need to schedule x3/week. Pt acknowledges this. SLP worked with pt on accuracy of his HEP procedure - he req'd usual min A with exercises except jaw opening against resistance (JOAR) which req'd usual min-mod cues faded to rare min cues. Pt brought in his EMST and he was still lacking cheek pursing - pt had EMST up to slighly > 90cm H2O and could not produce a bike pump sound - SLP recalibrated pt's workable level via 1/4 turns on the device, and was just below 90cm H2O, when on the 9th rep pt unable ot make a bike pump sound. .      Assessment / Recommendations / Plan   Plan  --   incr x3/week     Progression Toward Goals   Progression toward goals  Progressing toward goals       SLP Education - 12/08/19 1614    Education Details  HEP procedure    Person(s) Educated  Patient    Methods  Explanation;Demonstration;Verbal cues    Comprehension  Verbal cues required;Need further instruction;Returned demonstration;Verbalized understanding       SLP Short Term Goals - 12/08/19 1617      SLP SHORT TERM GOAL #1   Title  Pt will complete dysphagia HEP with rare min A x 3 sessions.  Time  2    Period  Weeks    Status  On-going      SLP SHORT TERM GOAL #2   Title  Patient will tolerate electrode placement for NMES x 3 sessions.    Baseline  12-05-19    Time  2    Period  Weeks    Status  On-going      SLP SHORT TERM GOAL #3   Title  Patient will tell SLP 3 s/sx of aspiration PNA.    Status  Achieved       SLP Long Term Goals - 12/08/19 1618      SLP LONG TERM GOAL #1   Title  Pt will demonstrate dysphagia HEP with modified independence x3 sessions.    Time  6    Period  Weeks    Status  On-going      SLP LONG TERM GOAL #2   Title  Pt will report thorough oral care prior to water/ice chips x6 sessions.    Baseline  11-29-19    Time  6    Period  Weeks    Status  On-going       Plan - 12/08/19 1614    Clinical Impression Statement  Michael Bright cont to enter ST room  with clear voice - no hydrophonia present.  He continues PEG dependent since hospitalization. Today he had a follow up  MBSS - see "skilled treatment", or evaluation in "imaging" for more details on that exam. Pt was taught  He has been doing nectar trials at home as well since last ST visit. He has also been taking sips of regular water and ice chips after oral care at home as well. Patient has been completing pharyngeal exercises and RMT (expiratory) with improvement in function noted on previous MBS (10-18-19). Pt desires to do NMES and will need a recert for O2VOJJ ST x6 weeks. I recommend cont'd skilled ST to address dysphagia for training in aspiration precautions, pharyngeal exercises, and  NMES to improve pt's swallow function for possible advancement to comfort feeds. Advise nectar thick liquid trials and (per MBSS) thin  liquids in supine as much as pt is comfortable.    Speech Therapy Frequency  3x / week    Duration  --   6 additional weeks or 25 total  visits   Treatment/Interventions  Aspiration precaution training;Cueing hierarchy;SLP instruction and feedback;Pharyngeal strengthening exercises;NMES;Compensatory techniques;Diet toleration management by SLP;Trials of upgraded texture/liquids;Patient/family education    Potential to Achieve Goals  Good    Potential Considerations  Severity of impairments    SLP Home Exercise Plan  Continue swallow ex and RMT per [facility] SLP; bring exercises to next session    Consulted and Agree with Plan of Care  Patient;Family member/caregiver       Patient will benefit from skilled therapeutic intervention in order to improve the following deficits and impairments:   Oropharyngeal dysphagia    Problem List Patient Active Problem List   Diagnosis Date Noted  . Lower extremity edema 11/24/2019  . Difficulty urinating 11/24/2019  . Dupuytren's contracture of left hand 09/20/2019  . Hyperlipidemia 08/24/2019  . Upper and lower extremity pain 08/24/2019   . OSA on CPAP 08/09/2019  . Copious oral secretions 08/09/2019  . Anemia of chronic disease   . Dysphagia due to recent cerebral infarction   . Diabetes mellitus type 2 in obese ([place])   . Morbid obesity ([place])   . Status post insertion of percutaneous endoscopic  gastrostomy (PEG) tube (HCC)   . History of CVA (cerebrovascular accident) 06/27/2019  . Essential hypertension 06/14/2019  . Renal insufficiency 05/30/2019  . Diplopia   . Chronic fatigue     Michael Bright ,MS, CCC-SLP  12/08/2019, 4:19 PM  [facility] [address] [place], [place], [address] Phone: [phone]   Fax:  [phone]   Name: Michael Bright MRN: [id number] Date of Birth: [birth year]

## 2019-12-08 NOTE — Therapy (Signed)
[facility] [address] [place], [place], [address] Phone: [phone]   Fax:  [phone]  Physical Therapy Treatment  Patient Details  Name: Michael Bright MRN: [id number] Date of Birth: 11-12-1955 Referring Provider (PT): Alger Simons, MD   Encounter Date: 12/08/2019  PT End of Session - 12/08/19 1617    Visit Number  7    Number of Visits  25    Date for PT Re-Evaluation  01/29/20    Authorization Type  BCBS - 2021 primary, Medicare Secondary    PT Start Time  1446    PT Stop Time  1529    PT Time Calculation (min)  43 min    Equipment Utilized During Treatment  Gait belt    Activity Tolerance  Patient tolerated treatment well    Behavior During Therapy  [facility] for tasks assessed/performed       Past Medical History:  Diagnosis Date  . Acute CVA (cerebrovascular accident) ([place]) 05/30/2019  . Acute kidney injury ([place])   . Acute respiratory failure ([place])   . Aspiration pneumonia ([place]) 06/14/2019  . 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 Diabetes ([place])   . 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 (CVA) 08/09/2019  . Pneumonia    aspiration pneumonia  . Sepsis with acute renal failure without septic shock ([place])   . Septic shock ([place])   . Stroke ([place])   . Urinary tract infection   . Urinary tract infection without hematuria     Past Surgical History:  Procedure Laterality Date  . CHOLECYSTECTOMY    . IR GASTROSTOMY TUBE MOD SED  07/04/2019    There were no vitals filed for this visit.  Subjective Assessment - 12/08/19 1450    Subjective  Tried the hip flexor stretch at home - it went really well and his hip is feeling a lot better.    Pertinent History  PMH: CVA (L lateral medullary syndrome) diabetes, HTN, OSA, morbid obesity.    Limitations  Standing;Walking;House hold activities    How long can you stand comfortably?  can stand approx. 10 minutes    How long can you walk  comfortably?  20 minutes with the rollator    Patient Stated Goals  wants to walk better, walk more steady, wants to be able to get up more easily.    Currently in Pain?  No/denies    Pain Onset  More than a month ago    Pain Onset  More than a month ago                       [facility] Adult PT Treatment/Exercise - 12/08/19 0001      Transfers   Transfers  Sit to Stand;Stand to Sit    Sit to Stand  5: Supervision;With upper extremity assist    Sit to Stand Details  Verbal cues for sequencing;Verbal cues for technique    Sit to Stand Details (indicate cue type and reason)  cues for anterior weight shift to stand     Stand to Sit  5: Supervision;Without upper extremity assist    Comments  2 x 10 reps from 23" height mat table with BUE support       Ambulation/Gait   Ambulation/Gait  Yes    Ambulation/Gait Assistance  5: Supervision    Ambulation/Gait Assistance Details  cues for heel strike B     Ambulation Distance (Feet)  115 Feet    Assistive  device  Rollator;Other (Comment)   L AFO   Gait Pattern  Step-through pattern;Decreased stride length;Decreased dorsiflexion - left;Trunk flexed;Poor foot clearance - left;Decreased step length - right;Decreased dorsiflexion - right;Decreased weight shift to left   LLE in external rotation   Ambulation Surface  Level;Indoor    Gait velocity  1.1 ft/sec with L AFO and bariatric rollator.      Exercises   Other Exercises   2 x  10 reps mini squats at mat table with BUE support on rollator, cues for proper technique and performing 2x per day for HEP      Knee/Hip Exercises: Standing   Other Standing Knee Exercises  with BUE support in // bars: side stepping down and back with red theraband around distal thighs for hip ABD strengthening, cues for step width and foot clearance    Other Standing Knee Exercises   standing with BUE support at countertop: 2 x 10 reps standing marching, cues for slowed and controlled, 2 x 10 reps heel raises  - added both to HEP          Balance Exercises - 12/08/19 1542      Balance Exercises: Standing   Retro Gait  4 reps;Other (comment);Limitations    Retro Gait Limitations  single UE support, cues for step length and weight shift to L    Sidestepping  Upper extremity support;3 reps;Limitations    Sidestepping Limitations  down and back 3 reps on blue foam in // bars with fingertip support, cues for foot clearance    Marching  Upper extremity assist 1;Dynamic;Forwards;Limitations    Marching Limitations  in // bars, cues for slowed and controlled for SLS, 4 reps        PT Education - 12/08/19 1617    Education Details  new additions to HEP    Person(s) Educated  Patient    Methods  Explanation;Demonstration;Handout    Comprehension  Verbalized understanding;Returned demonstration       PT Short Term Goals - 12/08/19 1455      PT SHORT TERM GOAL #1   Title  Patient will be independent with initial HEP in order to build upon functional gains made in therapy.  ALL STGS DUE 12/13/19    Time  6   due to initial delay in scheduling   Period  Weeks    Target Date  12/13/19      PT SHORT TERM GOAL #2   Title  Patient to improve BERG balance test to at least 32/56 for decreased fall risk.    Time  6    Period  Weeks    Status  Revised      PT SHORT TERM GOAL #3   Title  Patient will decrease TUG time with bariatric rollator to 25 seconds of less to demo decr fall risk.    Baseline  30.22 seconds on 10/31/19    Time  6    Period  Weeks    Status  New      PT SHORT TERM GOAL #4   Title  Patient will improve gait velocity with bariatric rollator to at least 2.1 ft/sec to demo a limited community ambulator and decr fall risk.    Baseline  1.1 ft/sec with L AFO and bariatric rollator.    Time  6    Period  Weeks    Status  Not Met      PT SHORT TERM GOAL #5   Title  Patient will decrease 5x  sit <> stand time to 25 seconds of less from elevated mat table with UE support in  order to demo improved functional LE strength.    Baseline  31.06 seconds    Time  6    Period  Weeks    Status  New        PT Long Term Goals - 11/20/19 1711      PT LONG TERM GOAL #1   Title  Patient will be independent with final HEP in order to build upon functional gains made in therapy. ALL LTGS DUE 01/29/20    Time  13   due to delay in scheduling   Period  Weeks    Status  New      PT LONG TERM GOAL #2   Title  Berg score to imporve to at least 40/56 for decreased fall risk.    Time  13    Period  Weeks    Status  Revised      PT LONG TERM GOAL #3   Title  Patient will decrease 5x sit <> stand time to 19 seconds of less from elevated mat table vs. standard chair with UE support in order to demo improved functional LE strength.    Time  13    Period  Weeks    Status  New      PT LONG TERM GOAL #4   Title  Patient will improve gait velocity with bariatric rollator vs. LRAD to at least 2.6 ft/sec to demo improved community mobility.    Time  13    Period  Weeks    Status  New      PT LONG TERM GOAL #5   Title  Patient will decrease TUG time with bariatric rollator vs. LRAD to 19 seconds of less to demo decr fall risk.    Time  13    Period  Weeks    Status  New      PT LONG TERM GOAL #6   Title  Patient will ambulate at least 300' with supervision over level indoor and outdoor surfaces with LRAD  with supervision in order to improve community mobility.    Time  13    Period  Weeks    Status  New            Plan - 12/08/19 1618    Clinical Impression Statement  Assessed pt's gait speed today with rollator and L AFO at 1.1 f/tsec, which is less than previously at eval at 1.7 ft/sec with rollator. Pt states that it is because of his L AFO. Will re-assess this STG at next session.Focus of today's session was LE strengthening, standing tolerance, and balance with lessening UE support. Continued to practice sit <> stands from higher mat table for proper technique  for forward weight shift as initially pt still with tendency to brace BLE. Will continue to progress towards LTGs.    Personal Factors and Comorbidities  Comorbidity 3+;Past/Current Experience;Time since onset of injury/illness/exacerbation    Comorbidities  diabetes, HTN, OSA, morbid obesity.    Examination-Activity Limitations  Squat;Transfers;Locomotion Level;Stairs    Examination-Participation Restrictions  Community Activity    Stability/Clinical Decision Making  Evolving/Moderate complexity    Rehab Potential  Good    PT Frequency  2x / week    PT Duration  12 weeks    PT Treatment/Interventions  ADLs/Self Care Home Management;Balance training;Therapeutic exercise;Therapeutic activities;Functional mobility training;Stair training;Gait training;DME Instruction;Aquatic Therapy;Neuromuscular re-education;Patient/family education;Orthotic Fit/Training;Energy conservation  PT Next Visit Plan  STGs due 2/24. continue sit<>stand from elevated surfaces, step ups, single limb stance and functional lower extremity strengthening; work in parallel bars to decrease UE support, ankle weight on LLE for incr proprioception    PT Home Exercise Plan  [id number] ([place])    Consulted and Agree with Plan of Care  Patient       Patient will benefit from skilled therapeutic intervention in order to improve the following deficits and impairments:  Abnormal gait, Decreased activity tolerance, Decreased balance, Decreased coordination, Decreased range of motion, Decreased knowledge of use of DME, Decreased knowledge of precautions, Decreased endurance, Decreased strength, Difficulty walking, Postural dysfunction, Impaired sensation, Obesity, Pain  Visit Diagnosis: Other lack of coordination  Muscle weakness (generalized)  Other disturbances of skin sensation  Other abnormalities of gait and mobility  Unsteadiness on feet     Problem List Patient Active Problem List   Diagnosis Date Noted  . Lower  extremity edema 11/24/2019  . Difficulty urinating 11/24/2019  . Dupuytren's contracture of left hand 09/20/2019  . Hyperlipidemia 08/24/2019  . Upper and lower extremity pain 08/24/2019  . OSA on CPAP 08/09/2019  . Copious oral secretions 08/09/2019  . Anemia of chronic disease   . Dysphagia due to recent cerebral infarction   . Diabetes mellitus type 2 in obese ([place])   . Morbid obesity ([place])   . Status post insertion of percutaneous endoscopic gastrostomy (PEG) tube ([place])   . History of CVA (cerebrovascular accident) 06/27/2019  . Essential hypertension 06/14/2019  . Renal insufficiency 05/30/2019  . Diplopia   . Chronic fatigue     Arliss Journey, PT, DPT  12/08/2019, 4:20 PM  [facility] [address] [place], [place], [address] Phone: [phone]   Fax:  [phone]  Name: Michael Bright MRN: [id number] Date of Birth: July 25, 1956

## 2019-12-08 NOTE — Therapy (Signed)
[facility] [address] [place], [place], [address] Phone: [phone]   Fax:  [phone]  Occupational Therapy Treatment  Patient Details  Name: Michael Bright MRN: [id number] Date of Birth: 02/07/1956 Referring Provider (OT): Dr. Naaman Plummer   Encounter Date: 12/08/2019  OT End of Session - 12/08/19 1442    Visit Number  7    Number of Visits  25    Date for OT Re-Evaluation  01/24/20    Authorization Type  [place]    Authorization - Visit Number  7    Authorization - Number of Visits  10    OT Start Time  9242    OT Stop Time  1445    OT Time Calculation (min)  38 min    Activity Tolerance  Patient tolerated treatment well    Behavior During Therapy  [facility] for tasks assessed/performed       Past Medical History:  Diagnosis Date  . Acute CVA (cerebrovascular accident) ([place]) 05/30/2019  . Acute kidney injury ([place])   . Acute respiratory failure ([place])   . Aspiration pneumonia ([place]) 06/14/2019  . 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 Diabetes ([place])   . 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 (CVA) 08/09/2019  . Pneumonia    aspiration pneumonia  . Sepsis with acute renal failure without septic shock ([place])   . Septic shock ([place])   . Stroke ([place])   . Urinary tract infection   . Urinary tract infection without hematuria     Past Surgical History:  Procedure Laterality Date  . CHOLECYSTECTOMY    . IR GASTROSTOMY TUBE MOD SED  07/04/2019    There were no vitals filed for this visit.  Subjective Assessment - 12/08/19 1442    Currently in Pain?  No/denies    Pain Onset  More than a month ago    Pain Onset  More than a month ago               Treatment: Therapist checked progress towards short term goals. Pt met functional reach and coordination short term goals. Pt performed functional reaching with sustained pinch to place and remove graded clothespins from vertical  antennae, with LUE Pt sat to copy a small peg design with LUE for increased fine motor coordination and functional use of LUE., min difficulty placing, mod drops when removing with in hand manipulation.  Arm bike x 6 mins level 3 for conditioning.              OT Short Term Goals - 12/08/19 1412      OT SHORT TERM GOAL #1   Title  I with HEP.    Time  4    Period  Weeks    Status  Achieved    Target Date  11/25/19      OT SHORT TERM GOAL #2   Title  Pt will demonstrate improved fine motor coordination for ADLs as evidenced by decreasing 9 hole peg test score to 50 secs or less    Baseline  RUE 35.41, LUE 57.78    Time  4    Period  Weeks    Status  Achieved   47.31     OT SHORT TERM GOAL #3   Title  Pt will demonstrate aiblity to retrievee light weight object at 110* shoulder flexion with LUE.    Time  4    Period  Weeks    Status  Achieved   >  110     OT SHORT TERM GOAL #4   Title  Pt will perfrom light home management/ basic cooking with supervision.    Time  4    Period  Weeks    Status  On-going   not yet addressed, pt can't eat yet       OT Long Term Goals - 11/27/19 1347      OT LONG TERM GOAL #1   Title  I with updated HEP.    Time  12    Period  Weeks    Status  New      OT LONG TERM GOAL #2   Title  Pt will demonstrate improved LUE fine motor coordination for ADLS as evidenced by decreasing 9 hole peg test score to 42 secs or less.    Time  12    Period  Weeks    Status  New      OT LONG TERM GOAL #3   Title  Pt will retrieve a lightweight object at 120 shoulder flexion with LUE.    Time  12    Period  Weeks    Status  New      OT LONG TERM GOAL #4   Title  Pt will perform mod complex cooking/ home management modified independently    Time  12    Period  Weeks    Status  New      OT LONG TERM GOAL #5   Title  Pt will perfrom all basic ADLS modified independently.    Time  12    Period  Weeks    Status  Achieved   per pt report  for BADLS             Patient will benefit from skilled therapeutic intervention in order to improve the following deficits and impairments:           Visit Diagnosis: Other lack of coordination  Muscle weakness (generalized)  Other disturbances of skin sensation  Other abnormalities of gait and mobility    Problem List Patient Active Problem List   Diagnosis Date Noted  . Lower extremity edema 11/24/2019  . Difficulty urinating 11/24/2019  . Dupuytren's contracture of left hand 09/20/2019  . Hyperlipidemia 08/24/2019  . Upper and lower extremity pain 08/24/2019  . OSA on CPAP 08/09/2019  . Copious oral secretions 08/09/2019  . Anemia of chronic disease   . Dysphagia due to recent cerebral infarction   . Diabetes mellitus type 2 in obese ([place])   . Morbid obesity ([place])   . Status post insertion of percutaneous endoscopic gastrostomy (PEG) tube ([place])   . History of CVA (cerebrovascular accident) 06/27/2019  . Essential hypertension 06/14/2019  . Renal insufficiency 05/30/2019  . Diplopia   . Chronic fatigue     Vandella Ord 12/08/2019, 2:43 PM  [facility] [address] [place], [place], [address] Phone: [phone]   Fax:  [phone]  Name: Michael Bright MRN: [id number] Date of Birth: [birth year]

## 2019-12-11 ENCOUNTER — Ambulatory Visit: Payer: BC Managed Care – PPO | Admitting: Physical Therapy

## 2019-12-11 ENCOUNTER — Ambulatory Visit: Payer: BC Managed Care – PPO | Admitting: Occupational Therapy

## 2019-12-11 ENCOUNTER — Ambulatory Visit: Payer: BC Managed Care – PPO

## 2019-12-11 ENCOUNTER — Other Ambulatory Visit: Payer: Self-pay

## 2019-12-11 DIAGNOSIS — M6281 Muscle weakness (generalized): Secondary | ICD-10-CM

## 2019-12-11 DIAGNOSIS — R2681 Unsteadiness on feet: Secondary | ICD-10-CM

## 2019-12-11 DIAGNOSIS — R2689 Other abnormalities of gait and mobility: Secondary | ICD-10-CM

## 2019-12-11 DIAGNOSIS — R1312 Dysphagia, oropharyngeal phase: Secondary | ICD-10-CM

## 2019-12-11 DIAGNOSIS — R278 Other lack of coordination: Secondary | ICD-10-CM | POA: Diagnosis not present

## 2019-12-11 NOTE — Therapy (Signed)
[facility] [address] [place], [place], [address] Phone: [phone]   Fax:  [phone]  Occupational Therapy Treatment  Patient Details  Name: Michael Bright MRN: [id number] Date of Birth: 21-Apr-1956 Referring Provider (OT): Dr. Naaman Plummer   Encounter Date: 12/11/2019  OT End of Session - 12/11/19 1350    Visit Number  8    Number of Visits  25    Date for OT Re-Evaluation  01/24/20    Authorization Type  [place]    Authorization - Visit Number  8    Authorization - Number of Visits  10    OT Start Time  7425    OT Stop Time  1400    OT Time Calculation (min)  45 min    Activity Tolerance  Patient tolerated treatment well    Behavior During Therapy  [facility] for tasks assessed/performed       Past Medical History:  Diagnosis Date  . Acute CVA (cerebrovascular accident) ([place]) 05/30/2019  . Acute kidney injury ([place])   . Acute respiratory failure ([place])   . Aspiration pneumonia ([place]) 06/14/2019  . 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 Diabetes ([place])   . 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 (CVA) 08/09/2019  . Pneumonia    aspiration pneumonia  . Sepsis with acute renal failure without septic shock ([place])   . Septic shock ([place])   . Stroke ([place])   . Urinary tract infection   . Urinary tract infection without hematuria     Past Surgical History:  Procedure Laterality Date  . CHOLECYSTECTOMY    . IR GASTROSTOMY TUBE MOD SED  07/04/2019    There were no vitals filed for this visit.  Subjective Assessment - 12/11/19 1321    Subjective   I'm not cooking anything because I can't eat yet    Pertinent History  Michael Bright is a 64 y.o. male with history fo T2DM, OSA, morbid obesity, recent left medullary stroke complicated by VDRF requiring tracheostomy, prolonged antibiotics for MSSA bacteremia with copious oral secretions due to severe oropharyngeal dysphagia s/p PEG, urinary retention  requiring I/O caths and was discharged to home on 07/17/19. He was readmitted on 07/20/19 with septic shock due to Klebsiella pneumoniae bacteremia with UTI and hypoxic respiratory failure bilateral lung infiltrates due to rhino virus. He was intubated briefly from 10/1-10/2 and treated with broad spectrum antibiotics till cultures finalized. Antibiotics narrowed to cefdinir on 10/5 with recommendations to complete 2 weeks total antibiotic course.  Hospital course significant for AKI with abnormal LFTs due to shocked liver, intermittent hypoxic episode as well as significant oral secretions with dysphonia. Discharged home 08/08/19.    Limitations  NPO, feeding tube    Patient Stated Goals  improve coordination and walking    Currently in Pain?  Yes    Pain Score  3     Pain Location  --   GENERALIZED   Pain Descriptors / Indicators  Aching    Pain Onset  Today    Pain Frequency  Intermittent    Aggravating Factors   RAINY WEATHER    Pain Relieving Factors  BC Powder       Discussed progress towards goals and encouraged pt to stand more at home to fold clothes and wash dishes. Pt practiced dynamic standing and functional ambulation in prep for kitchen tasks. Pt practiced getting items out of high and low cabinets w/ either 1 hand countertop support or hips leaning into  counter.  Practiced sit-stand x 2 w/o UE support.  Standing: performing high level reaching tasks w/ 1 # wrist weight on LUE for repetitive reaching and sustained reaching. Seated: placing medium sized pegs in pegboard (4 rows) w/ in hand manipulation for fingertip to/from palm translation.                       OT Short Term Goals - 12/08/19 1412      OT SHORT TERM GOAL #1   Title  I with HEP.    Time  4    Period  Weeks    Status  Achieved    Target Date  11/25/19      OT SHORT TERM GOAL #2   Title  Pt will demonstrate improved fine motor coordination for ADLs as evidenced by decreasing 9 hole peg test  score to 50 secs or less    Baseline  RUE 35.41, LUE 57.78    Time  4    Period  Weeks    Status  Achieved   47.31     OT SHORT TERM GOAL #3   Title  Pt will demonstrate aiblity to retrievee light weight object at 110* shoulder flexion with LUE.    Time  4    Period  Weeks    Status  Achieved   >110     OT SHORT TERM GOAL #4   Title  Pt will perfrom light home management/ basic cooking with supervision.    Time  4    Period  Weeks    Status  On-going   not yet addressed, pt can't eat yet       OT Long Term Goals - 11/27/19 1347      OT LONG TERM GOAL #1   Title  I with updated HEP.    Time  12    Period  Weeks    Status  New      OT LONG TERM GOAL #2   Title  Pt will demonstrate improved LUE fine motor coordination for ADLS as evidenced by decreasing 9 hole peg test score to 42 secs or less.    Time  12    Period  Weeks    Status  New      OT LONG TERM GOAL #3   Title  Pt will retrieve a lightweight object at 120 shoulder flexion with LUE.    Time  12    Period  Weeks    Status  New      OT LONG TERM GOAL #4   Title  Pt will perform mod complex cooking/ home management modified independently    Time  12    Period  Weeks    Status  New      OT LONG TERM GOAL #5   Title  Pt will perfrom all basic ADLS modified independently.    Time  12    Period  Weeks    Status  Achieved   per pt report for BADLS           Plan - 12/11/19 1355    Clinical Impression Statement  Pt progressing with standing tolerance and LUE strength/coordination    Occupational performance deficits (Please refer to evaluation for details):  ADL's;IADL's;Leisure;Social Participation    Body Structure / Function / Physical Skills  ADL;UE functional use;Flexibility;Pain;FMC;ROM;Coordination;GMC;Sensation;Decreased knowledge of precautions;Decreased knowledge of use of DME;IADL;Dexterity;Strength;Mobility;Endurance;Balance;Vision    Rehab Potential  Good    OT  Frequency  2x / week     OT Duration  12 weeks    OT Treatment/Interventions  Self-care/ADL training;Aquatic Therapy;DME and/or AE instruction;Patient/family education;Balance training;Passive range of motion;Gait Training;Paraffin;Cryotherapy;Fluidtherapy;Splinting;Building services engineer;Contrast Bath;Electrical Stimulation;Moist Heat;Therapeutic exercise;Manual Therapy;Therapeutic activities;Neuromuscular education    Plan  UBE, standing tolerance, continue LUE coordination    Consulted and Agree with Plan of Care  Patient       Patient will benefit from skilled therapeutic intervention in order to improve the following deficits and impairments:   Body Structure / Function / Physical Skills: ADL, UE functional use, Flexibility, Pain, FMC, ROM, Coordination, GMC, Sensation, Decreased knowledge of precautions, Decreased knowledge of use of DME, IADL, Dexterity, Strength, Mobility, Endurance, Balance, Vision       Visit Diagnosis: Muscle weakness (generalized)  Unsteadiness on feet  Other lack of coordination    Problem List Patient Active Problem List   Diagnosis Date Noted  . Lower extremity edema 11/24/2019  . Difficulty urinating 11/24/2019  . Dupuytren's contracture of left hand 09/20/2019  . Hyperlipidemia 08/24/2019  . Upper and lower extremity pain 08/24/2019  . OSA on CPAP 08/09/2019  . Copious oral secretions 08/09/2019  . Anemia of chronic disease   . Dysphagia due to recent cerebral infarction   . Diabetes mellitus type 2 in obese ([facility])   . Morbid obesity ([facility])   . Status post insertion of percutaneous endoscopic gastrostomy (PEG) tube ([facility])   . History of CVA (cerebrovascular accident) 06/27/2019  . Essential hypertension 06/14/2019  . Renal insufficiency 05/30/2019  . Diplopia   . Chronic fatigue     Kelli Churn, OTR/L 12/11/2019, 2:04 PM  [facility] [address] [place], [place], [address] Phone:  [phone]   Fax:  [phone]  Name: Michael Bright MRN: [id number] Date of Birth: 1956-08-27

## 2019-12-11 NOTE — Patient Instructions (Addendum)
Access Code: [email or web address] URL: [email or web address] Date: 12/11/2019 Prepared by: Lonia Blood  Exercises Sit to Stand - 10 reps - 1 sets - 2x daily - 5x weekly Seated Toe Raise - 10 reps - 2 sets - 3 sec hold - 1x daily - 7x weekly Quarter Squat with Table - 10 reps - 1 sets - 2x daily - 5x weekly Heel rises with counter support - 10 reps - 2 sets - 2x daily - 7x weekly Standing March with Counter Support - 10 reps - 2 sets - 1x daily - 7x weekly  Added 12/11/2019 Partial Tandem Stance - 3 reps - 1 sets - 10 sec hold - 1x daily - 5x weekly Single Leg Stance with Support - 3 reps - 1 sets - 10 sec hold - 1x daily - 5x weekly

## 2019-12-11 NOTE — Patient Instructions (Signed)
Bring two cans tomato juice next session.  Try some water lying down - small sips, after VERY METICULOUS oral care

## 2019-12-11 NOTE — Therapy (Signed)
[facility] [address] [place], [place], [address] Phone: [phone]   Fax:  [phone]  Speech Language Pathology Treatment  Patient Details  Name: Michael Bright MRN: [id number] Date of Birth: 1956/04/06 Referring Provider (SLP): Dr. Naaman Plummer   Encounter Date: 12/11/2019  End of Session - 12/11/19 1606    Visit Number  8    Number of Visits  25    Date for SLP Re-Evaluation  02/16/20    Authorization Type  BCBS Primary, Medicare secondary    SLP Start Time  1450    SLP Stop Time   1530    SLP Time Calculation (min)  40 min    Activity Tolerance  Patient tolerated treatment well       Past Medical History:  Diagnosis Date  . Acute CVA (cerebrovascular accident) ([place]) 05/30/2019  . Acute kidney injury ([place])   . Acute respiratory failure ([place])   . Aspiration pneumonia ([place]) 06/14/2019  . 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 Diabetes ([place])   . 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 (CVA) 08/09/2019  . Pneumonia    aspiration pneumonia  . Sepsis with acute renal failure without septic shock ([place])   . Septic shock ([place])   . Stroke ([place])   . Urinary tract infection   . Urinary tract infection without hematuria     Past Surgical History:  Procedure Laterality Date  . CHOLECYSTECTOMY    . IR GASTROSTOMY TUBE MOD SED  07/04/2019    There were no vitals filed for this visit.  Subjective Assessment - 12/11/19 1552    Subjective  Pt arrived with shaved neck and tomato juice.    Currently in Pain?  No/denies            ADULT SLP TREATMENT - 12/11/19 1553      Treatment Provided   Treatment provided  Dysphagia      Cognitive-Linquistic Treatment   Skilled Treatment  Pt was seen for work with swallowing/dysphagia, using NMES at 46.52mA with placement 3b (horizontal) to facilitate pharyngeal constriction. Pt reported at end of session, "I could tell that was doing something. My  voice didn't have that water around it - it wasn't as wet sounding." SLP agrees that pt with minimal "wet" voice today during placement, however pt without "wet" sounding voice normally with placement. No overt s/s aspiration during session today - pt swallows x3 for each sip were </= 2 seconds from bolus presentation until after 10th sip then pt swallows were >2 seconds and <4 seconds ~40% of the time. In the last 3 swallows pt rarely had swallow >4 seconds from bolus introduction or additional swallows.       Assessment / Recommendations / Plan   Plan  Goals updated   pt is now scheduled U7/OZDG to do NMES;re-cert sent 6-44-03     Progression Toward Goals   Progression toward goals  Progressing toward goals       SLP Education - 12/11/19 1606    Education Details  H2O in supine (see pt instructions)    Person(s) Educated  Patient    Methods  Explanation    Comprehension  Verbalized understanding       SLP Short Term Goals - 12/11/19 1608      SLP SHORT TERM GOAL #1   Title  Pt will complete dysphagia HEP with rare min A x 3 sessions.    Time  1    Period  Weeks  Status  On-going      SLP SHORT TERM GOAL #2   Title  Patient will tolerate electrode placement for NMES x 3 sessions.    Baseline  12-05-19, 12-11-19    Time  1    Period  Weeks    Status  On-going      SLP SHORT TERM GOAL #3   Title  Patient will tell SLP 3 s/sx of aspiration PNA.    Status  Achieved       SLP Long Term Goals - 12/11/19 1609      SLP LONG TERM GOAL #1   Title  Pt will demonstrate dysphagia HEP with modified independence x3 sessions.    Time  6    Period  Weeks    Status  On-going      SLP LONG TERM GOAL #2   Title  Pt will report thorough oral care prior to water/ice chips x6 sessions.    Baseline  11-29-19    Time  6    Period  Weeks    Status  On-going      SLP LONG TERM GOAL #3   Title  pt will tolerate electrode placement for 7 therapy sessions    Baseline  12-11-19    Time  6     Period  Weeks    Status  New      SLP LONG TERM GOAL #4   Title  pt will swallow within 2 seconds of bolus presentation to oral cavity, or in subsequent swallows at least 80% with at least 60 swallows    Time  6    Period  Weeks    Status  New       Plan - 12/11/19 1607    Clinical Impression Statement  [name] cont to enter ST room with clear voice - no hydrophonia present.  He continues PEG dependent since hospitalization. See "skilled treatment" for  more details. He cont to perform nectar trials at home since last ST visit and has also been taking sips of regular water and ice chips after oral care at home as well. SLP encouraged pt to take thin in supine at home. Patient has been completing pharyngeal exercises and RMT (expiratory) with improvement in function noted on previous MBS (10-18-19). Pt desires to do NMES and will need a recert for x3week ST x6 weeks. I recommend cont'd skilled ST to address dysphagia for training in aspiration precautions, pharyngeal exercises, and  NMES to improve pt's swallow function for possible advancement to comfort feeds. Advise nectar thick liquid trials and (per MBSS) thin  liquids in supine as much as pt is comfortable.    Speech Therapy Frequency  3x / week   or 25 total visits   Duration  --   8 weeks or 17 visits   Treatment/Interventions  Aspiration precaution training;Cueing hierarchy;SLP instruction and feedback;Pharyngeal strengthening exercises;NMES;Compensatory techniques;Diet toleration management by SLP;Trials of upgraded texture/liquids;Patient/family education    Potential to Achieve Goals  Good    Potential Considerations  Severity of impairments    SLP Home Exercise Plan  Continue swallow ex and RMT per [facility] SLP; bring exercises to next session    Consulted and Agree with Plan of Care  Patient;Family member/caregiver       Patient will benefit from skilled therapeutic intervention in order to improve the following deficits and  impairments:   Oropharyngeal dysphagia    Problem List Patient Active Problem List   Diagnosis Date Noted  .  Lower extremity edema 11/24/2019  . Difficulty urinating 11/24/2019  . Dupuytren's contracture of left hand 09/20/2019  . Hyperlipidemia 08/24/2019  . Upper and lower extremity pain 08/24/2019  . OSA on CPAP 08/09/2019  . Copious oral secretions 08/09/2019  . Anemia of chronic disease   . Dysphagia due to recent cerebral infarction   . Diabetes mellitus type 2 in obese (HCC)   . Morbid obesity (HCC)   . Status post insertion of percutaneous endoscopic gastrostomy (PEG) tube (HCC)   . History of CVA (cerebrovascular accident) 06/27/2019  . Essential hypertension 06/14/2019  . Renal insufficiency 05/30/2019  . Diplopia   . Chronic fatigue     Derinda Bartus ,MS, CCC-SLP  12/11/2019, 4:12 PM  [facility] [address] [place], [place], [address] Phone: [phone]   Fax:  [phone]   Name: Michael Bright MRN: [id number] Date of Birth: Mar 31, 1956

## 2019-12-12 ENCOUNTER — Ambulatory Visit: Payer: BC Managed Care – PPO | Admitting: Adult Health

## 2019-12-12 NOTE — Therapy (Signed)
[facility] [address] [place], [place], [address] Phone: [phone]   Fax:  [phone]  Physical Therapy Treatment  Patient Details  Name: Michael Bright MRN: [id number] Date of Birth: 04-01-1956 Referring Provider (PT): Alger Simons, MD   Encounter Date: 12/11/2019  PT End of Session - 12/12/19 0903    Visit Number  8    Number of Visits  25    Date for PT Re-Evaluation  01/29/20    Authorization Type  BCBS - 2021 primary, Medicare Secondary    PT Start Time  1402    PT Stop Time  1447    PT Time Calculation (min)  45 min    Equipment Utilized During Treatment  Gait belt    Activity Tolerance  Patient tolerated treatment well    Behavior During Therapy  [facility] for tasks assessed/performed       Past Medical History:  Diagnosis Date  . Acute CVA (cerebrovascular accident) ([place]) 05/30/2019  . Acute kidney injury ([place])   . Acute respiratory failure ([place])   . Aspiration pneumonia ([place]) 06/14/2019  . 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 Diabetes ([place])   . 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 (CVA) 08/09/2019  . Pneumonia    aspiration pneumonia  . Sepsis with acute renal failure without septic shock ([place])   . Septic shock ([place])   . Stroke ([place])   . Urinary tract infection   . Urinary tract infection without hematuria     Past Surgical History:  Procedure Laterality Date  . CHOLECYSTECTOMY    . IR GASTROSTOMY TUBE MOD SED  07/04/2019    There were no vitals filed for this visit.  Subjective Assessment - 12/11/19 1405    Subjective  Was in a hurry and didn't put the brace on.  No changes.  Feel like I'm getting up and down better.    Pertinent History  PMH: CVA (L lateral medullary syndrome) diabetes, HTN, OSA, morbid obesity.    Limitations  Standing;Walking;House hold activities    How long can you stand comfortably?  can stand approx. 10 minutes    How long can you walk  comfortably?  20 minutes with the rollator    Patient Stated Goals  wants to walk better, walk more steady, wants to be able to get up more easily.    Currently in Pain?  No/denies    Pain Onset  More than a month ago    Pain Onset  More than a month ago                       [facility] Adult PT Treatment/Exercise - 12/11/19 1406      Transfers   Transfers  Sit to Stand;Stand to Sit    Sit to Stand  5: Supervision;With upper extremity assist;From bed   20" and from 24" mat   Five time sit to stand comments   37.69 seconds from 20" mat, BUE support; from elevated (24" height) mat, 27.19 sec)    Stand to Sit  5: Supervision;Without upper extremity assist;To bed      Ambulation/Gait   Ambulation/Gait  Yes    Ambulation/Gait Assistance  5: Supervision    Ambulation Distance (Feet)  245 Feet    Assistive device  Rollator;Other (Comment)    Gait Pattern  Step-through pattern;Decreased stride length;Decreased dorsiflexion - left;Trunk flexed;Poor foot clearance - left;Decreased step length - right;Decreased dorsiflexion - right;Decreased weight shift to  left    Ambulation Surface  Level;Indoor    Gait velocity  1.6 ft/sec (20.47 sec)      Standardized Balance Assessment   Standardized Balance Assessment  Timed Up and Go Test      Timed Up and Go Test   TUG  Normal TUG    Normal TUG (seconds)  26   25.93     Knee/Hip Exercises: Standing   Heel Raises  Both;1 set;10 reps   Toe raises   Hip Abduction  Stengthening;Right;Left;1 set;10 reps;Knee straight    Abduction Limitations  Cues for posture    Hip Extension  Stengthening;Right;Left;1 set;10 reps    Extension Limitations  Cues for posture, technique    Other Standing Knee Exercises  At counter, side stepping down and back, 2 laps, with red theraband around distal thighs for hip ABD strengthening, cues for step width and foot clearance    Other Standing Knee Exercises  Alternating step taps to 4" step, 2 sets x 10 reps           Balance Exercises - 12/12/19 0902      Balance Exercises: Standing   SLS  Eyes open;2 reps;10 secs;Upper extremity support 2    Partial Tandem Stance  Eyes open;Upper extremity support 1;2 reps;10 secs   Cues for glut activation, upright posture   Retro Gait  3 reps;Upper extremity support   Forward/back walking at counter   Retro Gait Limitations  Cues for increased step length, weightshift for improved stance time and stability    Sidestepping  Upper extremity support;3 reps;Limitations    Sidestepping Limitations  AT counter, cues for increased foot clearance    Other Standing Exercises  Wide BOS lateral weightshifting 2 sets x 10 reps, then stagger stance forward/back rocking 2 sets x 10 reps        PT Education - 12/12/19 0903    Education Details  Progress towards goals, addition to HEP    Person(s) Educated  Patient    Methods  Explanation;Handout;Demonstration    Comprehension  Verbalized understanding;Returned demonstration       PT Short Term Goals - 12/12/19 0904      PT SHORT TERM GOAL #1   Title  Patient will be independent with initial HEP in order to build upon functional gains made in therapy.  ALL STGS DUE 12/13/19    Time  6   due to initial delay in scheduling   Period  Weeks    Target Date  12/13/19      PT SHORT TERM GOAL #2   Title  Patient to improve BERG balance test to at least 32/56 for decreased fall risk.    Time  6    Period  Weeks    Status  Revised      PT SHORT TERM GOAL #3   Title  Patient will decrease TUG time with bariatric rollator to 25 seconds of less to demo decr fall risk.    Baseline  30.22 seconds on 10/31/19; 25.93 sec 12/11/2019    Time  6    Period  Weeks    Status  Partially Met      PT SHORT TERM GOAL #4   Title  Patient will improve gait velocity with bariatric rollator to at least 2.1 ft/sec to demo a limited community ambulator and decr fall risk.    Baseline  1.1 ft/sec with L AFO and bariatric rollator; 1.6  ft/sec 12/11/2019    Time  6  Period  Weeks    Status  Not Met      PT SHORT TERM GOAL #5   Title  Patient will decrease 5x sit <> stand time to 25 seconds of less from elevated mat table with UE support in order to demo improved functional LE strength.    Baseline  31.06 seconds; 27.19 sec 12/11/2019    Time  6    Period  Weeks    Status  Partially Met        PT Long Term Goals - 11/20/19 1711      PT LONG TERM GOAL #1   Title  Patient will be independent with final HEP in order to build upon functional gains made in therapy. ALL LTGS DUE 01/29/20    Time  13   due to delay in scheduling   Period  Weeks    Status  New      PT LONG TERM GOAL #2   Title  Berg score to imporve to at least 40/56 for decreased fall risk.    Time  13    Period  Weeks    Status  Revised      PT LONG TERM GOAL #3   Title  Patient will decrease 5x sit <> stand time to 19 seconds of less from elevated mat table vs. standard chair with UE support in order to demo improved functional LE strength.    Time  13    Period  Weeks    Status  New      PT LONG TERM GOAL #4   Title  Patient will improve gait velocity with bariatric rollator vs. LRAD to at least 2.6 ft/sec to demo improved community mobility.    Time  13    Period  Weeks    Status  New      PT LONG TERM GOAL #5   Title  Patient will decrease TUG time with bariatric rollator vs. LRAD to 19 seconds of less to demo decr fall risk.    Time  13    Period  Weeks    Status  New      PT LONG TERM GOAL #6   Title  Patient will ambulate at least 300' with supervision over level indoor and outdoor surfaces with LRAD  with supervision in order to improve community mobility.    Time  13    Period  Weeks    Status  New            Plan - 12/12/19 0905    Clinical Impression Statement  Began assessing STGs this visit, with pt partially meeting STG 3 (for improve TUG score, just not quite to goal level); pt has not met gait velocity (1.6  ft/sec); pt has partially met STG 5 for 5x sit<>stand (improved to 27.19 sec., but not to goal level).  Pt is making progress towards goals, but continues to demonstrate trunk and lower extremity weakness, which is contributing to decreased balance, transfers, and gait independence.  He will continue to benefit from further skilled PT to work towards improved independent mobility.    Personal Factors and Comorbidities  Comorbidity 3+;Past/Current Experience;Time since onset of injury/illness/exacerbation    Comorbidities  diabetes, HTN, OSA, morbid obesity.    Examination-Activity Limitations  Squat;Transfers;Locomotion Level;Stairs    Examination-Participation Restrictions  Community Activity    Stability/Clinical Decision Making  Evolving/Moderate complexity    Rehab Potential  Good    PT Frequency  2x / week  PT Duration  12 weeks    PT Treatment/Interventions  ADLs/Self Care Home Management;Balance training;Therapeutic exercise;Therapeutic activities;Functional mobility training;Stair training;Gait training;DME Instruction;Aquatic Therapy;Neuromuscular re-education;Patient/family education;Orthotic Fit/Training;Energy conservation    PT Next Visit Plan  Check remaining STGs and continue work on transfers, functional strength (step ups, SLS), work in parallel bars to decrease UE support, ankle weight for LLE to increase proprioception    PT Home Exercise Plan  [id number] ([place])    Consulted and Agree with Plan of Care  Patient       Patient will benefit from skilled therapeutic intervention in order to improve the following deficits and impairments:  Abnormal gait, Decreased activity tolerance, Decreased balance, Decreased coordination, Decreased range of motion, Decreased knowledge of use of DME, Decreased knowledge of precautions, Decreased endurance, Decreased strength, Difficulty walking, Postural dysfunction, Impaired sensation, Obesity, Pain  Visit Diagnosis: Unsteadiness on  feet  Muscle weakness (generalized)  Other abnormalities of gait and mobility     Problem List Patient Active Problem List   Diagnosis Date Noted  . Lower extremity edema 11/24/2019  . Difficulty urinating 11/24/2019  . Dupuytren's contracture of left hand 09/20/2019  . Hyperlipidemia 08/24/2019  . Upper and lower extremity pain 08/24/2019  . OSA on CPAP 08/09/2019  . Copious oral secretions 08/09/2019  . Anemia of chronic disease   . Dysphagia due to recent cerebral infarction   . Diabetes mellitus type 2 in obese ([place])   . Morbid obesity ([place])   . Status post insertion of percutaneous endoscopic gastrostomy (PEG) tube ([place])   . History of CVA (cerebrovascular accident) 06/27/2019  . Essential hypertension 06/14/2019  . Renal insufficiency 05/30/2019  . Diplopia   . Chronic fatigue     Shawnise Peterkin W. 12/12/2019, 9:10 AM Frazier Butt., PT  [facility] [address] [place], [place], [address] Phone: [phone]   Fax:  [phone]  Name: Michael Bright MRN: [id number] Date of Birth: [birth year]

## 2019-12-13 ENCOUNTER — Ambulatory Visit: Payer: BC Managed Care – PPO

## 2019-12-13 ENCOUNTER — Ambulatory Visit: Payer: BC Managed Care – PPO | Admitting: Physical Therapy

## 2019-12-13 ENCOUNTER — Other Ambulatory Visit: Payer: Self-pay

## 2019-12-13 ENCOUNTER — Ambulatory Visit: Payer: BC Managed Care – PPO | Admitting: Occupational Therapy

## 2019-12-13 DIAGNOSIS — R278 Other lack of coordination: Secondary | ICD-10-CM

## 2019-12-13 DIAGNOSIS — R2689 Other abnormalities of gait and mobility: Secondary | ICD-10-CM

## 2019-12-13 DIAGNOSIS — R2681 Unsteadiness on feet: Secondary | ICD-10-CM

## 2019-12-13 DIAGNOSIS — R208 Other disturbances of skin sensation: Secondary | ICD-10-CM

## 2019-12-13 DIAGNOSIS — R1312 Dysphagia, oropharyngeal phase: Secondary | ICD-10-CM

## 2019-12-13 DIAGNOSIS — M6281 Muscle weakness (generalized): Secondary | ICD-10-CM

## 2019-12-13 NOTE — Therapy (Addendum)
[facility] [address] [place], [place], [address] Phone: [phone]   Fax:  [phone]  Speech Language Pathology Treatment  Patient Details  Name: Michael Bright MRN: [id number] Date of Birth: 1956/03/13 Referring Provider (SLP): Dr. Riley Kill   Encounter Date: 12/13/2019  End of Session - 12/13/19 1705    Visit Number  9    Number of Visits  25    Date for SLP Re-Evaluation  02/16/20    SLP Start Time  1450    SLP Stop Time   1530    SLP Time Calculation (min)  40 min    Activity Tolerance  Patient tolerated treatment well       Past Medical History:  Diagnosis Date  . Acute CVA (cerebrovascular accident) (HCC) 05/30/2019  . Acute kidney injury (HCC)   . Acute respiratory failure (HCC)   . Aspiration pneumonia (HCC) 06/14/2019  . 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 Diabetes (HCC)   . 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 (CVA) 08/09/2019  . Pneumonia    aspiration pneumonia  . Sepsis with acute renal failure without septic shock (HCC)   . Septic shock (HCC)   . Stroke (HCC)   . Urinary tract infection   . Urinary tract infection without hematuria     Past Surgical History:  Procedure Laterality Date  . CHOLECYSTECTOMY    . IR GASTROSTOMY TUBE MOD SED  07/04/2019    There were no vitals filed for this visit.  Subjective Assessment - 12/13/19 1510    Subjective  "Haven't tried (the water lying down) yet."    Currently in Pain?  No/denies            ADULT SLP TREATMENT - 12/13/19 1515      General Information   Behavior/Cognition  Alert;Pleasant mood;Cooperative      Treatment Provided   Treatment provided  Dysphagia      Cognitive-Linquistic Treatment   Skilled Treatment  SLP used skilled swallow therapist skills to conduct ST today with pt. No overt s/sx aspiration PNA seen since last session. Pt cont on room air. SLP observed pt with nectar liquids. No overt  s/sx of aspiration during session with NMES at 17.0 mA and placement 3a. As session progressed, 15-20 minutes into the session, pt showed signs of fatigue such as incr'd swallow trigger time and what appeared to be more frequent decr'd thyroid excursion when swallowing. Pt with effortful swallow between cans of nectar, no cues necessary. With jaw opening against resistance pt req'd usual mod cues. [place] req'd demo cues initially but was independent after this.       Assessment / Recommendations / Plan   Plan  Continue with current plan of care      Progression Toward Goals   Progression toward goals  Progressing toward goals       SLP Education - 12/13/19 1706    Education Details  hep procedure (jaw opening against resisitacne)    Person(s) Educated  Patient    Methods  Explanation;Demonstration;Verbal cues    Comprehension  Verbalized understanding;Returned demonstration;Need further instruction       SLP Short Term Goals - 12/11/19 1608      SLP SHORT TERM GOAL #1   Title  Pt will complete dysphagia HEP with rare min A x 3 sessions.    Time  1    Period  Weeks    Status  On-going      SLP SHORT  TERM GOAL #2   Title  Patient will tolerate electrode placement for NMES x 3 sessions.    Baseline  12-05-19, 12-11-19    Time  1    Period  Weeks    Status  On-going      SLP SHORT TERM GOAL #3   Title  Patient will tell SLP 3 s/sx of aspiration PNA.    Status  Achieved       SLP Long Term Goals - 12/13/19 1707      SLP LONG TERM GOAL #1   Title  Pt will demonstrate dysphagia HEP with modified independence x3 sessions.    Time  5    Period  Weeks    Status  On-going      SLP LONG TERM GOAL #2   Title  Pt will report thorough oral care prior to water/ice chips x6 sessions.    Baseline  11-29-19, 12-13-19   Time  5    Period  Weeks    Status  On-going      SLP LONG TERM GOAL #3   Title  pt will tolerate electrode placement for 7 therapy sessions    Baseline  12-11-19,  12-13-19   Time  5    Period  Weeks    Status  New      SLP LONG TERM GOAL #4   Title  pt will swallow within 2 seconds of bolus presentation to oral cavity, or in subsequent swallows at least 80% with at least 60 swallows    Time  5    Period  Weeks    Status  New       Plan - 12/13/19 1706    Clinical Impression Statement  Mr. Wieman cont to enter ST room with clear voice - no hydrophonia present.  He continues PEG dependent since hospitalization. See "skilled treatment" for  more details. He cont to perform nectar trials at home since last ST visit and has also been taking sips of regular water and ice chips after oral care at home as well. SLP encouraged pt to take thin in supine at home. Patient has been completing pharyngeal exercises and RMT (expiratory) with improvement in function noted on previous MBS (10-18-19). Pt desires to do NMES and will need a recert for [id number] ST x6 weeks. I recommend cont'd skilled ST to address dysphagia for training in aspiration precautions, pharyngeal exercises, and  NMES to improve pt's swallow function for possible advancement to comfort feeds. Advise nectar thick liquid trials and (per MBSS) thin  liquids in supine as much as pt is comfortable.    Speech Therapy Frequency  3x / week   or 25 total visits   Duration  --   8 weeks or 17 visits   Treatment/Interventions  Aspiration precaution training;Cueing hierarchy;SLP instruction and feedback;Pharyngeal strengthening exercises;NMES;Compensatory techniques;Diet toleration management by SLP;Trials of upgraded texture/liquids;Patient/family education    Potential to Achieve Goals  Good    Potential Considerations  Severity of impairments    SLP Home Exercise Plan  Continue swallow ex and RMT per [facility] SLP; bring exercises to next session    Consulted and Agree with Plan of Care  Patient;Family member/caregiver       Patient will benefit from skilled therapeutic intervention in order to improve the  following deficits and impairments:   Oropharyngeal dysphagia    Problem List Patient Active Problem List   Diagnosis Date Noted  . Lower extremity edema 11/24/2019  . Difficulty urinating  11/24/2019  . Dupuytren's contracture of left hand 09/20/2019  . Hyperlipidemia 08/24/2019  . Upper and lower extremity pain 08/24/2019  . OSA on CPAP 08/09/2019  . Copious oral secretions 08/09/2019  . Anemia of chronic disease   . Dysphagia due to recent cerebral infarction   . Diabetes mellitus type 2 in obese (HCC)   . Morbid obesity (HCC)   . Status post insertion of percutaneous endoscopic gastrostomy (PEG) tube (HCC)   . History of CVA (cerebrovascular accident) 06/27/2019  . Essential hypertension 06/14/2019  . Renal insufficiency 05/30/2019  . Diplopia   . Chronic fatigue     Fae Blossom ,MS, CCC-SLP  12/13/2019, 5:08 PM  [facility] [address] [place], [place], [address] Phone: [phone]   Fax:  [phone]   Name: Duayne Brideau MRN: [id number] Date of Birth: 01-Jun-1956

## 2019-12-13 NOTE — Therapy (Signed)
[facility] [address] [place], [place], [address] Phone: [phone]   Fax:  [phone]  Occupational Therapy Treatment  Patient Details  Name: Michael Bright MRN: [id number] Date of Birth: 07-Sep-1956 Referring Provider (OT): Dr. Naaman Plummer   Encounter Date: 12/13/2019  OT End of Session - 12/13/19 1620    Visit Number  9    Number of Visits  25    Date for OT Re-Evaluation  01/24/20    Authorization Type  [facility]    Authorization - Visit Number  9    Authorization - Number of Visits  10    OT Start Time  0932    OT Stop Time  1658    OT Time Calculation (min)  39 min    Activity Tolerance  Patient tolerated treatment well    Behavior During Therapy  [facility] for tasks assessed/performed       Past Medical History:  Diagnosis Date  . Acute CVA (cerebrovascular accident) ([place]) 05/30/2019  . Acute kidney injury ([place])   . Acute respiratory failure ([place])   . Aspiration pneumonia ([place]) 06/14/2019  . 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 Diabetes ([place])   . 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 (CVA) 08/09/2019  . Pneumonia    aspiration pneumonia  . Sepsis with acute renal failure without septic shock ([place])   . Septic shock ([place])   . Stroke ([place])   . Urinary tract infection   . Urinary tract infection without hematuria     Past Surgical History:  Procedure Laterality Date  . CHOLECYSTECTOMY    . IR GASTROSTOMY TUBE MOD SED  07/04/2019    There were no vitals filed for this visit.  Subjective Assessment - 12/13/19 1619    Currently in Pain?  Yes    Pain Score  4     Pain Location  Leg    Pain Orientation  Left    Pain Descriptors / Indicators  Aching    Pain Type  Acute pain    Pain Onset  More than a month ago    Pain Frequency  Intermittent    Aggravating Factors   unknown    Pain Relieving Factors  unknown              Treatment: Supine on wedge closed chain  shoulder flexion and chest press, mod facilitation for shoulder positioning followed by chest press and shoulder flexion with 3 lbs weight between hands for scapular activation, mod facilitation. Seated low range shoulder flexion  Closed chain seated, mod facilitation Seated at table grooved pegs for fine motor coordination, min difficulty with LUE Gripper set at level 2 to pick up 1 inch blocks, with LUE for sustained grip, min difficulty               OT Short Term Goals - 12/08/19 1412      OT SHORT TERM GOAL #1   Title  I with HEP.    Time  4    Period  Weeks    Status  Achieved    Target Date  11/25/19      OT SHORT TERM GOAL #2   Title  Pt will demonstrate improved fine motor coordination for ADLs as evidenced by decreasing 9 hole peg test score to 50 secs or less    Baseline  RUE 35.41, LUE 57.78    Time  4    Period  Weeks    Status  Achieved   47.31     OT SHORT TERM GOAL #3   Title  Pt will demonstrate aiblity to retrievee light weight object at 110* shoulder flexion with LUE.    Time  4    Period  Weeks    Status  Achieved   >110     OT SHORT TERM GOAL #4   Title  Pt will perfrom light home management/ basic cooking with supervision.    Time  4    Period  Weeks    Status  On-going   not yet addressed, pt can't eat yet       OT Long Term Goals - 11/27/19 1347      OT LONG TERM GOAL #1   Title  I with updated HEP.    Time  12    Period  Weeks    Status  New      OT LONG TERM GOAL #2   Title  Pt will demonstrate improved LUE fine motor coordination for ADLS as evidenced by decreasing 9 hole peg test score to 42 secs or less.    Time  12    Period  Weeks    Status  New      OT LONG TERM GOAL #3   Title  Pt will retrieve a lightweight object at 120 shoulder flexion with LUE.    Time  12    Period  Weeks    Status  New      OT LONG TERM GOAL #4   Title  Pt will perform mod complex cooking/ home management modified independently    Time   12    Period  Weeks    Status  New      OT LONG TERM GOAL #5   Title  Pt will perfrom all basic ADLS modified independently.    Time  12    Period  Weeks    Status  Achieved   per pt report for BADLS             Patient will benefit from skilled therapeutic intervention in order to improve the following deficits and impairments:           Visit Diagnosis: Muscle weakness (generalized)  Other abnormalities of gait and mobility  Other lack of coordination  Other disturbances of skin sensation    Problem List Patient Active Problem List   Diagnosis Date Noted  . Lower extremity edema 11/24/2019  . Difficulty urinating 11/24/2019  . Dupuytren's contracture of left hand 09/20/2019  . Hyperlipidemia 08/24/2019  . Upper and lower extremity pain 08/24/2019  . OSA on CPAP 08/09/2019  . Copious oral secretions 08/09/2019  . Anemia of chronic disease   . Dysphagia due to recent cerebral infarction   . Diabetes mellitus type 2 in obese (HCC)   . Morbid obesity (HCC)   . Status post insertion of percutaneous endoscopic gastrostomy (PEG) tube (HCC)   . History of CVA (cerebrovascular accident) 06/27/2019  . Essential hypertension 06/14/2019  . Renal insufficiency 05/30/2019  . Diplopia   . Chronic fatigue     Michael Bright 12/13/2019, 4:44 PM  [facility] [address] [place], [place], [address] Phone: [phone]   Fax:  [phone]  Name: Michael Bright MRN: [id number] Date of Birth: [birth year]

## 2019-12-14 NOTE — Therapy (Signed)
[facility] [address] [place], [place], [address] Phone: [phone]   Fax:  [phone]  Physical Therapy Treatment  Patient Details  Name: Michael Bright MRN: [id number] Date of Birth: 1955/10/24 Referring Provider (PT): Alger Simons, MD   Encounter Date: 12/13/2019  PT End of Session - 12/14/19 2116    Visit Number  9    Number of Visits  25    Date for PT Re-Evaluation  01/29/20    Authorization Type  BCBS - 2021 primary, Medicare Secondary    PT Start Time  1532    PT Stop Time  1615    PT Time Calculation (min)  43 min    Equipment Utilized During Treatment  Gait belt    Activity Tolerance  Patient tolerated treatment well    Behavior During Therapy  [facility] for tasks assessed/performed       Past Medical History:  Diagnosis Date  . Acute CVA (cerebrovascular accident) ([place]) 05/30/2019  . Acute kidney injury ([place])   . Acute respiratory failure ([place])   . Aspiration pneumonia ([place]) 06/14/2019  . 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 Diabetes ([place])   . 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 (CVA) 08/09/2019  . Pneumonia    aspiration pneumonia  . Sepsis with acute renal failure without septic shock ([place])   . Septic shock ([place])   . Stroke ([place])   . Urinary tract infection   . Urinary tract infection without hematuria     Past Surgical History:  Procedure Laterality Date  . CHOLECYSTECTOMY    . IR GASTROSTOMY TUBE MOD SED  07/04/2019    There were no vitals filed for this visit.  Subjective Assessment - 12/13/19 1534    Subjective  Had a fall on Monday night, tripped over his grand son and fell on his left side. His son had to help him out. Has put Bengay on and it has been helping the soreness.    Pertinent History  PMH: CVA (L lateral medullary syndrome) diabetes, HTN, OSA, morbid obesity.    Limitations  Standing;Walking;House hold activities    How long can you stand  comfortably?  can stand approx. 10 minutes    How long can you walk comfortably?  20 minutes with the rollator    Patient Stated Goals  wants to walk better, walk more steady, wants to be able to get up more easily.    Currently in Pain?  Yes    Pain Score  4     Pain Location  Leg    Pain Orientation  Left    Pain Descriptors / Indicators  Sore    Pain Type  Acute pain    Pain Onset  More than a month ago    Aggravating Factors   started after the fall    Pain Relieving Factors  bengay, BC powder.    Pain Onset  More than a month ago         [facility] PT Assessment - 12/13/19 1538      Standardized Balance Assessment   Standardized Balance Assessment  Berg Balance Test      Berg Balance Test   Sit to Stand  Able to stand using hands after several tries    Standing Unsupported  Able to stand safely 2 minutes    Sitting with Back Unsupported but Feet Supported on Floor or Stool  Able to sit safely and securely 2 minutes    Stand  to Sit  Controls descent by using hands    Transfers  Able to transfer safely, definite need of hands    Standing Unsupported with Eyes Closed  Able to stand 10 seconds with supervision    Standing Unsupported with Feet Together  Able to place feet together independently and stand 1 minute safely    From Standing, Reach Forward with Outstretched Arm  Can reach forward >5 cm safely (2")    From Standing Position, Pick up Object from [place] to pick up shoe, needs supervision    From Standing Position, Turn to Look Behind Over each Shoulder  Looks behind one side only/other side shows less weight shift    Turn 360 Degrees  Needs close supervision or verbal cueing   close supervision turning L   Standing Unsupported, Alternately Place Feet on Step/Stool  Needs assistance to keep from falling or unable to try    Standing Unsupported, One Foot in Front  Able to plae foot ahead of the other independently and hold 30 seconds    Standing on One Leg  Unable to try  or needs assist to prevent fall    Total Score  35    Berg comment:  35/56                   [facility] Adult PT Treatment/Exercise - 12/14/19 0001      Transfers   Transfers  Sit to Stand;Stand to Sit    Sit to Stand  5: Supervision;With upper extremity assist    Sit to Stand Details  Verbal cues for sequencing;Verbal cues for technique    Sit to Stand Details (indicate cue type and reason)  from lower mat table,  when more fatigued took a couple of attempts to come to stand     Stand to Sit  5: Supervision;With upper extremity assist      Ambulation/Gait   Ambulation/Gait  Yes    Ambulation/Gait Assistance  5: Supervision    Ambulation/Gait Assistance Details  clinic distances throughout session    Ambulation Distance (Feet)  115 Feet    Assistive device  Rollator;Other (Comment)   L AFO   Gait Pattern  Step-through pattern;Decreased stride length;Decreased dorsiflexion - left;Trunk flexed;Poor foot clearance - left;Decreased step length - right;Decreased dorsiflexion - right;Decreased weight shift to left    Ambulation Surface  Level;Indoor      Neuro Re-ed    Neuro Re-ed Details   On blue mat with feet hip width: 3 x 20 eyes closed, with eyes closed: 2 x 5 reps head turns, 2 x 5 reps head nods, close min guard for balance.       Knee/Hip Exercises: Standing   Knee Flexion  Strengthening;Right;Left;1 set;10 reps    Knee Flexion Limitations  with 2# ankle weight, cues for proper technique and posture    Hip Abduction  Stengthening;Right;Left;1 set;10 reps;Knee straight    Abduction Limitations  cues for technique and hip ABD activation, use of 2# ankle weight    Other Standing Knee Exercises  at countertop: side stepping down and back 3 reps with red theraband around distal thighs for hip ABD strengthening, cues for technique, step width, and foot clearance             PT Education - 12/14/19 2115    Education Details  progress towards goals, fall risk     Person(s) Educated  Patient    Methods  Explanation    Comprehension  Verbalized  understanding       PT Short Term Goals - 12/13/19 1554      PT SHORT TERM GOAL #1   Title  Patient will be independent with initial HEP in order to build upon functional gains made in therapy.  ALL STGS DUE 12/13/19    Baseline  pt reports performing initial HEP at home.    Time  6   due to initial delay in scheduling   Period  Weeks    Status  Achieved    Target Date  12/13/19      PT SHORT TERM GOAL #2   Title  Patient to improve BERG balance test to at least 32/56 for decreased fall risk.    Baseline  35/56 on 12/13/19    Time  6    Period  Weeks    Status  Achieved      PT SHORT TERM GOAL #3   Title  Patient will decrease TUG time with bariatric rollator to 25 seconds of less to demo decr fall risk.    Baseline  30.22 seconds on 10/31/19; 25.93 sec 12/11/2019    Time  6    Period  Weeks    Status  Partially Met      PT SHORT TERM GOAL #4   Title  Patient will improve gait velocity with bariatric rollator to at least 2.1 ft/sec to demo a limited community ambulator and decr fall risk.    Baseline  1.1 ft/sec with L AFO and bariatric rollator; 1.6 ft/sec 12/11/2019    Time  6    Period  Weeks    Status  Not Met      PT SHORT TERM GOAL #5   Title  Patient will decrease 5x sit <> stand time to 25 seconds of less from elevated mat table with UE support in order to demo improved functional LE strength.    Baseline  31.06 seconds; 27.19 sec 12/11/2019    Time  6    Period  Weeks    Status  Partially Met      Revised/on-going STGs:   PT Short Term Goals - 12/14/19 2127      PT SHORT TERM GOAL #1   Title  Patient will be independent with initial HEP in order to build upon functional gains made in therapy.  ALL STGS DUE 01/11/20    Baseline  pt will benefit from on-going additions to HEP    Time  4    Period  Weeks    Status  On-going    Target Date  01/11/20      PT SHORT TERM GOAL #2    Title  Patient to improve BERG balance test to at least 38/56 for decreased fall risk.    Baseline  35/56 on 12/13/19    Time  4    Period  Weeks    Status  Revised      PT SHORT TERM GOAL #3   Title  Patient will decrease TUG time with bariatric rollator to 24 seconds of less to demo decr fall risk.    Baseline  30.22 seconds on 10/31/19; 25.93 sec 12/11/2019    Time  4    Period  Weeks    Status  Revised      PT SHORT TERM GOAL #4   Title  Patient will improve gait velocity with bariatric rollator to at least 2.0 ft/sec to demo a limited community ambulator and decr fall risk.  Baseline  1.1 ft/sec with L AFO and bariatric rollator; 1.6 ft/sec 12/11/2019    Time  4    Period  Weeks    Status  Revised      PT SHORT TERM GOAL #5   Title  Patient will decrease 5x sit <> stand time to 25 seconds of less from elevated mat table with UE support in order to demo improved functional LE strength.    Baseline  31.06 seconds; 27.19 sec 12/11/2019    Time  4    Period  Weeks    Status  On-going        PT Long Term Goals - 11/20/19 1711      PT LONG TERM GOAL #1   Title  Patient will be independent with final HEP in order to build upon functional gains made in therapy. ALL LTGS DUE 01/29/20    Time  13   due to delay in scheduling   Period  Weeks    Status  New      PT LONG TERM GOAL #2   Title  Berg score to imporve to at least 40/56 for decreased fall risk.    Time  13    Period  Weeks    Status  Revised      PT LONG TERM GOAL #3   Title  Patient will decrease 5x sit <> stand time to 19 seconds of less from elevated mat table vs. standard chair with UE support in order to demo improved functional LE strength.    Time  13    Period  Weeks    Status  New      PT LONG TERM GOAL #4   Title  Patient will improve gait velocity with bariatric rollator vs. LRAD to at least 2.6 ft/sec to demo improved community mobility.    Time  13    Period  Weeks    Status  New      PT LONG TERM  GOAL #5   Title  Patient will decrease TUG time with bariatric rollator vs. LRAD to 19 seconds of less to demo decr fall risk.    Time  13    Period  Weeks    Status  New      PT LONG TERM GOAL #6   Title  Patient will ambulate at least 300' with supervision over level indoor and outdoor surfaces with LRAD  with supervision in order to improve community mobility.    Time  13    Period  Weeks    Status  New            Plan - 12/14/19 2126    Clinical Impression Statement  Today's skilled session focused on checking remaining STG - pt scored a 35/56 on the BERG today (previously was 25/56), however pt is still at a significant risk for falls. Revised/updated STGs as appropriate. Pt still having difficulty and demonstrating decr functional LE strength with sit <> stands - especially from lower surfaces, when fatigued needed a couple attempts to come to stand. Remainder of session focused on standing tolerance, LE strengthening, and balance strategies on a compliant surface with eyes closed. Will continue to progress towards LTGs to decr fall risk, improve functional LE strength, and improve functional mobility for incr independence.    Personal Factors and Comorbidities  Comorbidity 3+;Past/Current Experience;Time since onset of injury/illness/exacerbation    Comorbidities  diabetes, HTN, OSA, morbid obesity.    Examination-Activity Limitations  Squat;Transfers;Locomotion  Level;Stairs    Examination-Participation Restrictions  Community Activity    Stability/Clinical Decision Making  Evolving/Moderate complexity    Rehab Potential  Good    PT Frequency  2x / week    PT Duration  12 weeks    PT Treatment/Interventions  ADLs/Self Care Home Management;Balance training;Therapeutic exercise;Therapeutic activities;Functional mobility training;Stair training;Gait training;DME Instruction;Aquatic Therapy;Neuromuscular re-education;Patient/family education;Orthotic Fit/Training;Energy conservation     PT Next Visit Plan  10th visit progress note. continue work on transfers, functional strength (step ups, SLS),balance strategies on compliant surfaces, work in parallel bars to decrease UE support, ankle weight for LLE to increase proprioception    PT Home Exercise Plan  [id number] ([place])    Consulted and Agree with Plan of Care  Patient       Patient will benefit from skilled therapeutic intervention in order to improve the following deficits and impairments:  Abnormal gait, Decreased activity tolerance, Decreased balance, Decreased coordination, Decreased range of motion, Decreased knowledge of use of DME, Decreased knowledge of precautions, Decreased endurance, Decreased strength, Difficulty walking, Postural dysfunction, Impaired sensation, Obesity, Pain  Visit Diagnosis: Muscle weakness (generalized)  Other abnormalities of gait and mobility  Other lack of coordination  Other disturbances of skin sensation  Unsteadiness on feet     Problem List Patient Active Problem List   Diagnosis Date Noted  . Lower extremity edema 11/24/2019  . Difficulty urinating 11/24/2019  . Dupuytren's contracture of left hand 09/20/2019  . Hyperlipidemia 08/24/2019  . Upper and lower extremity pain 08/24/2019  . OSA on CPAP 08/09/2019  . Copious oral secretions 08/09/2019  . Anemia of chronic disease   . Dysphagia due to recent cerebral infarction   . Diabetes mellitus type 2 in obese ([place])   . Morbid obesity ([place])   . Status post insertion of percutaneous endoscopic gastrostomy (PEG) tube ([place])   . History of CVA (cerebrovascular accident) 06/27/2019  . Essential hypertension 06/14/2019  . Renal insufficiency 05/30/2019  . Diplopia   . Chronic fatigue     Arliss Journey, PT, DPT  12/14/2019, 9:27 PM  [facility] [address] [place], [place], [address] Phone: [phone]   Fax:  [phone]  Name: Michael Bright MRN: [id number] Date of Birth: 06-01-1956

## 2019-12-15 ENCOUNTER — Ambulatory Visit: Payer: BC Managed Care – PPO

## 2019-12-15 ENCOUNTER — Other Ambulatory Visit: Payer: Self-pay

## 2019-12-15 DIAGNOSIS — R1312 Dysphagia, oropharyngeal phase: Secondary | ICD-10-CM

## 2019-12-15 DIAGNOSIS — R278 Other lack of coordination: Secondary | ICD-10-CM | POA: Diagnosis not present

## 2019-12-15 NOTE — Therapy (Signed)
[facility] [address] [place], [place], [address] Phone: [phone]   Fax:  [phone]  Speech Language Pathology Treatment  Patient Details  Name: Michael Bright MRN: [id number] Date of Birth: 1956/09/20 Referring Provider (SLP): Dr. Naaman Plummer   Encounter Date: 12/15/2019  End of Session - 12/15/19 1509    Visit Number  10    Number of Visits  25    Date for SLP Re-Evaluation  02/16/20    Authorization Type  BCBS Primary, Medicare secondary    SLP Start Time  1403    SLP Stop Time   1445    SLP Time Calculation (min)  42 min    Activity Tolerance  Patient tolerated treatment well       Past Medical History:  Diagnosis Date  . Acute CVA (cerebrovascular accident) ([place]) 05/30/2019  . Acute kidney injury ([place])   . Acute respiratory failure ([place])   . Aspiration pneumonia ([place]) 06/14/2019  . 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 Diabetes ([place])   . 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 (CVA) 08/09/2019  . Pneumonia    aspiration pneumonia  . Sepsis with acute renal failure without septic shock ([place])   . Septic shock ([place])   . Stroke ([place])   . Urinary tract infection   . Urinary tract infection without hematuria     Past Surgical History:  Procedure Laterality Date  . CHOLECYSTECTOMY    . IR GASTROSTOMY TUBE MOD SED  07/04/2019    There were no vitals filed for this visit.  Subjective Assessment - 12/15/19 1412    Subjective  Pt brought two cans tomato juice. Pt tried water lying down without any coughing.    Currently in Pain?  No/denies            ADULT SLP TREATMENT - 12/15/19 1419      General Information   Behavior/Cognition  Alert;Pleasant mood;Cooperative      Treatment Provided   Treatment provided  Dysphagia      Cognitive-Linquistic Treatment   Skilled Treatment  SLP used skilled swallow therapist skills to conduct ST today with pt. No overt s/sx aspiration  PNA seen since last session. Pt cont on room air. SLP observed pt with nectar liquids. No overt s/sx of aspiration during session with NMES at 16.5 mA and placement 3a. SLP used demo cues for jaw opening against resistance (JOAR) and tactile cues for [facility] as pt's thyroid not elevating to highest extent and holding - pt's thyroid is beginning descent as pt is attempting to hold it up. Pt finished the tomato juice in first 12 minutes of ST. Effortful swallows completed with excellent success; pt reports he cont to perform EMST and CTAR at home.       Assessment / Recommendations / Plan   Plan  Continue with current plan of care      Progression Toward Goals   Progression toward goals  Progressing toward goals         SLP Short Term Goals - 12/15/19 1510      SLP SHORT TERM GOAL #1   Title  Pt will complete dysphagia HEP with rare min A x 3 sessions.    Status  Partially Met      SLP SHORT TERM GOAL #2   Title  Patient will tolerate electrode placement for NMES x 3 sessions.    Baseline  12-05-19, 12-11-19    Status  Achieved  SLP SHORT TERM GOAL #3   Title  Patient will tell SLP 3 s/sx of aspiration PNA.    Status  Achieved       SLP Long Term Goals - 12/15/19 1510      SLP LONG TERM GOAL #1   Title  Pt will demonstrate dysphagia HEP with modified independence x3 sessions.    Time  5    Period  Weeks    Status  On-going      SLP LONG TERM GOAL #2   Title  Pt will report thorough oral care prior to water/ice chips x6 sessions.    Baseline  11-29-19, 12-13-19, 12-15-19    Time  5    Period  Weeks    Status  On-going      SLP LONG TERM GOAL #3   Title  pt will tolerate electrode placement for 7 therapy sessions    Baseline  12-11-19, 12-13-19, 12-15-19    Time  5    Period  Weeks    Status  On-going      SLP LONG TERM GOAL #4   Title  pt will swallow within 2 seconds of bolus presentation to oral cavity, or in subsequent swallows at least 80% with at least 60 swallows     Time  5    Period  Weeks    Status  On-going       Plan - 12/15/19 1509    Clinical Impression Statement  Mr. Venhuizen cont to enter ST room with clear voice - no hydrophonia present.  He continues PEG dependent since hospitalization. See "skilled treatment" for  more details. He cont to perform nectar trials at home since last ST visit and has also been taking sips of regular water and ice chips after oral care at home as well. SLP encouraged pt to take thin in supine at home. Patient has been completing pharyngeal exercises and RMT (expiratory) with improvement in function noted on previous MBS (10-18-19). Pt desires to do NMES and will need a recert for P1SRPR ST x6 weeks. I recommend cont'd skilled ST to address dysphagia for training in aspiration precautions, pharyngeal exercises, and  NMES to improve pt's swallow function for possible advancement to comfort feeds. Advise nectar thick liquid trials and (per MBSS) thin  liquids in supine as much as pt is comfortable.    Speech Therapy Frequency  3x / week   or 25 total visits   Duration  --   8 weeks or 17 visits   Treatment/Interventions  Aspiration precaution training;Cueing hierarchy;SLP instruction and feedback;Pharyngeal strengthening exercises;NMES;Compensatory techniques;Diet toleration management by SLP;Trials of upgraded texture/liquids;Patient/family education    Potential to Achieve Goals  Good    Potential Considerations  Severity of impairments    SLP Home Exercise Plan  Continue swallow ex and RMT per [facility] SLP; bring exercises to next session    Consulted and Agree with Plan of Care  Patient;Family member/caregiver       Patient will benefit from skilled therapeutic intervention in order to improve the following deficits and impairments:   Oropharyngeal dysphagia    Problem List Patient Active Problem List   Diagnosis Date Noted  . Lower extremity edema 11/24/2019  . Difficulty urinating 11/24/2019  . Dupuytren's  contracture of left hand 09/20/2019  . Hyperlipidemia 08/24/2019  . Upper and lower extremity pain 08/24/2019  . OSA on CPAP 08/09/2019  . Copious oral secretions 08/09/2019  . Anemia of chronic disease   . Dysphagia  due to recent cerebral infarction   . Diabetes mellitus type 2 in obese ([place])   . Morbid obesity ([place])   . Status post insertion of percutaneous endoscopic gastrostomy (PEG) tube ([place])   . History of CVA (cerebrovascular accident) 06/27/2019  . Essential hypertension 06/14/2019  . Renal insufficiency 05/30/2019  . Diplopia   . Chronic fatigue     Jaimey Franchini ,MS, CCC-SLP  12/15/2019, 3:11 PM  [facility] [address] [place], [place], [address] Phone: [phone]   Fax:  [phone]   Name: Carolos Fecher MRN: [id number] Date of Birth: 05/26/1956

## 2019-12-18 ENCOUNTER — Other Ambulatory Visit: Payer: Self-pay | Admitting: Primary Care

## 2019-12-18 DIAGNOSIS — E1169 Type 2 diabetes mellitus with other specified complication: Secondary | ICD-10-CM

## 2019-12-19 ENCOUNTER — Ambulatory Visit: Payer: BC Managed Care – PPO | Attending: Physical Medicine & Rehabilitation

## 2019-12-19 ENCOUNTER — Ambulatory Visit: Payer: BC Managed Care – PPO

## 2019-12-19 ENCOUNTER — Ambulatory Visit: Payer: BC Managed Care – PPO | Admitting: Occupational Therapy

## 2019-12-19 ENCOUNTER — Other Ambulatory Visit: Payer: Self-pay

## 2019-12-19 DIAGNOSIS — R278 Other lack of coordination: Secondary | ICD-10-CM

## 2019-12-19 DIAGNOSIS — R2681 Unsteadiness on feet: Secondary | ICD-10-CM | POA: Insufficient documentation

## 2019-12-19 DIAGNOSIS — R2689 Other abnormalities of gait and mobility: Secondary | ICD-10-CM | POA: Insufficient documentation

## 2019-12-19 DIAGNOSIS — R209 Unspecified disturbances of skin sensation: Secondary | ICD-10-CM | POA: Insufficient documentation

## 2019-12-19 DIAGNOSIS — R208 Other disturbances of skin sensation: Secondary | ICD-10-CM

## 2019-12-19 DIAGNOSIS — M6281 Muscle weakness (generalized): Secondary | ICD-10-CM | POA: Insufficient documentation

## 2019-12-19 DIAGNOSIS — R1312 Dysphagia, oropharyngeal phase: Secondary | ICD-10-CM | POA: Diagnosis not present

## 2019-12-19 NOTE — Therapy (Signed)
[facility] [address] [place], [place], [address] Phone: [phone]   Fax:  [phone]  Speech Language Pathology Treatment  Patient Details  Name: Michael Bright MRN: [id number] Date of Birth: 03-09-1956 Referring Provider (SLP): Dr. Naaman Plummer   Encounter Date: 12/19/2019  End of Session - 12/19/19 1633    Visit Number  11    Number of Visits  25    Date for SLP Re-Evaluation  02/16/20    SLP Start Time  1450    SLP Stop Time   1530    SLP Time Calculation (min)  40 min    Activity Tolerance  Patient tolerated treatment well       Past Medical History:  Diagnosis Date  . Acute CVA (cerebrovascular accident) ([place]) 05/30/2019  . Acute kidney injury ([place])   . Acute respiratory failure ([place])   . Aspiration pneumonia ([place]) 06/14/2019  . 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 Diabetes ([place])   . 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 (CVA) 08/09/2019  . Pneumonia    aspiration pneumonia  . Sepsis with acute renal failure without septic shock ([place])   . Septic shock ([place] [place])   . Stroke ([place])   . Urinary tract infection   . Urinary tract infection without hematuria     Past Surgical History:  Procedure Laterality Date  . CHOLECYSTECTOMY    . IR GASTROSTOMY TUBE MOD SED  07/04/2019    There were no vitals filed for this visit.  Subjective Assessment - 12/19/19 1507    Subjective  Pt brought two cans tomato juice.    Currently in Pain?  No/denies            ADULT SLP TREATMENT - 12/19/19 1507      General Information   Behavior/Cognition  Alert;Pleasant mood;Cooperative      Treatment Provided   Treatment provided  Dysphagia      Cognitive-Linquistic Treatment   Skilled Treatment  SLP used skilled swallow therapist skills to conduct ST today with pt. Pt denies any overt s/sx aspiration PNA since last session. Pt cont on room air. SLP observed pt with nectar liquids with NMES  placed #3a, 16.5 mA. Pt with cough once on larger bolus size and SLP cued pt for smaller bolus sizes, pt appeared to adhere for remainder of session.  Pt did NOT finish tomato juice cans during session so SLP believes pt taking smaller sips today. SLP used demo cues for dynamic chin tuck against resistance and pt return demonstrated. Pt reports he cont to perform EMST and CTAR at home. CTAR completed here with 60-second hold WNL procedure. EMST completed with cheeks puffed -SLP previously attempted to reduce this occurrence with pt but pt back to performing with puffed cheeks.      Assessment / Recommendations / Plan   Plan  Continue with current plan of care      Progression Toward Goals   Progression toward goals  Progressing toward goals         SLP Short Term Goals - 12/15/19 1510      SLP SHORT TERM GOAL #1   Title  Pt will complete dysphagia HEP with rare min A x 3 sessions.    Status  Partially Met      SLP SHORT TERM GOAL #2   Title  Patient will tolerate electrode placement for NMES x 3 sessions.    Baseline  12-05-19, 12-11-19    Status  Achieved  SLP SHORT TERM GOAL #3   Title  Patient will tell SLP 3 s/sx of aspiration PNA.    Status  Achieved       SLP Long Term Goals - 12/19/19 1651      SLP LONG TERM GOAL #1   Title  Pt will demonstrate dysphagia HEP with modified independence x3 sessions.    Time  4    Period  Weeks    Status  On-going      SLP LONG TERM GOAL #2   Title  Pt will report thorough oral care prior to water/ice chips x6 sessions.    Baseline  11-29-19, 12-15-19    Time  4    Period  Weeks    Status  On-going      SLP LONG TERM GOAL #3   Title  pt will tolerate electrode placement for 7 therapy sessions    Baseline  12-11-19, 12-15-19, 12-19-19    Time  4    Period  Weeks    Status  On-going      SLP LONG TERM GOAL #4   Title  pt will swallow within 2 seconds of bolus presentation to oral cavity, or in subsequent swallows at least 80% with at  least 60 swallows    Time  4    Period  Weeks    Status  On-going       Plan - 12/19/19 1649    Clinical Impression Statement  Pt enters ST room without hydrophonia present.  He continues PEG dependent since hospitalization. See "skilled treatment" for  more details. He cont to perform nectar trials at home and also reports taking sips of regular water in supine after oral care at home. Patient has been completing pharyngeal exercises and RMT (expiratory) with improvement in function noted on previous MBS (10-18-19).  I recommend cont'd skilled ST to address dysphagia for training in aspiration precautions, pharyngeal exercises, and  NMES to improve pt's swallow function for possible advancement to comfort feeds. Advise nectar thick liquid trials and (per MBSS) thin  liquids in supine as much as pt is comfortable.    Speech Therapy Frequency  3x / week   or 25 total visits   Duration  --   8 weeks or 17 visits   Treatment/Interventions  Aspiration precaution training;Cueing hierarchy;SLP instruction and feedback;Pharyngeal strengthening exercises;NMES;Compensatory techniques;Diet toleration management by SLP;Trials of upgraded texture/liquids;Patient/family education    Potential to Achieve Goals  Good    Potential Considerations  Severity of impairments    SLP Home Exercise Plan  Continue swallow ex and RMT per [facility] SLP; bring exercises to next session    Consulted and Agree with Plan of Care  Patient;Family member/caregiver       Patient will benefit from skilled therapeutic intervention in order to improve the following deficits and impairments:   Oropharyngeal dysphagia    Problem List Patient Active Problem List   Diagnosis Date Noted  . Lower extremity edema 11/24/2019  . Difficulty urinating 11/24/2019  . Dupuytren's contracture of left hand 09/20/2019  . Hyperlipidemia 08/24/2019  . Upper and lower extremity pain 08/24/2019  . OSA on CPAP 08/09/2019  . Copious oral secretions  08/09/2019  . Anemia of chronic disease   . Dysphagia due to recent cerebral infarction   . Diabetes mellitus type 2 in obese ([place] [place])   . Morbid obesity ([place])   . Status post insertion of percutaneous endoscopic gastrostomy (PEG) tube ([facility])   . History of CVA (  cerebrovascular accident) 06/27/2019  . Essential hypertension 06/14/2019  . Renal insufficiency 05/30/2019  . Diplopia   . Chronic fatigue     Shakaria Raphael ,MS, CCC-SLP  12/19/2019, 4:51 PM  [facility] [address] [place], [place], [address] Phone: [phone]   Fax:  [phone]   Name: Massie Mees MRN: [id number] Date of Birth: May 26, 1956

## 2019-12-19 NOTE — Therapy (Signed)
[facility] [address] [place], [place], [address] Phone: [phone]   Fax:  [phone]  Occupational Therapy Treatment  Patient Details  Name: Michael Bright MRN: [id number] Date of Birth: [birth year] Referring Provider (OT): Dr. Naaman Plummer   Encounter Date: 12/19/2019  OT End of Session - 12/19/19 1410    Visit Number  10    Number of Visits  25    Date for OT Re-Evaluation  01/24/20    Authorization Type  [place]    Authorization - Visit Number  10    Authorization - Number of Visits  10    OT Start Time  4628    OT Stop Time  1445    OT Time Calculation (min)  40 min    Activity Tolerance  Patient tolerated treatment well    Behavior During Therapy  [facility] for tasks assessed/performed       Past Medical History:  Diagnosis Date  . Acute CVA (cerebrovascular accident) ([facility]) 05/30/2019  . Acute kidney injury ([facility])   . Acute respiratory failure ([place])   . Aspiration pneumonia ([facility]) 06/14/2019  . 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 Diabetes ([facility])   . 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 (CVA) 08/09/2019  . Pneumonia    aspiration pneumonia  . Sepsis with acute renal failure without septic shock ([facility])   . Septic shock ([facility])   . Stroke ([facility])   . Urinary tract infection   . Urinary tract infection without hematuria     Past Surgical History:  Procedure Laterality Date  . CHOLECYSTECTOMY    . IR GASTROSTOMY TUBE MOD SED  07/04/2019    There were no vitals filed for this visit.  Subjective Assessment - 12/19/19 1403    Subjective   Denies pain    Patient is accompanied by:  Family member   wife   Pertinent History  Rea Reser is a 64 y.o. 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 He was readmitted on 07/20/19 with septic shock due to Klebsiella pneumoniae bacteremia with UTI and hypoxic respiratory failure bilateral lung infiltrates due to rhino virus. He was intubated briefly from 10/1-10/2 and treated with broad spectrum antibiotics till cultures finalized. Antibiotics narrowed to cefdinir on 10/5 with recommendations to complete 2 weeks total antibiotic course.  Hospital course significant for AKI with abnormal LFTs due to shocked liver, intermittent hypoxic episode as well as significant oral secretions with dysphonia. Discharged home 08/08/19.    Limitations  NPO, feeding tube    Patient Stated Goals  improve coordination and walking    Currently in Pain?  No/denies    Pain Onset  More than a month ago    Pain Onset  --                    Treatment: Supine on wedge closed chain shoulder flexion and chest press, mod facilitation for shoulder positioning followed by chest press and shoulder flexion with 3 lbs weight between hands for scapular activation, min facilitation. Seated functional reaching to copy small peg design, mod difficulty/ drops due to fine motor coordination deficits, min v.c  Removing pegs with in hand manipulation, mod difficulty/ drops. Closed chain mid-range shoulder flexion, min v.c to avoid  compensations            OT Short Term Goals - 12/08/19 1412      OT SHORT TERM GOAL #1   Title  I with HEP.    Time  4    Period  Weeks    Status  Achieved    Target Date  11/25/19      OT SHORT TERM GOAL #2   Title  Pt will demonstrate improved fine motor coordination for ADLs as evidenced by decreasing 9 hole peg test score to 50 secs or less    Baseline  RUE 35.41, LUE 57.78    Time  4    Period  Weeks    Status  Achieved   47.31     OT SHORT TERM GOAL #3   Title  Pt will demonstrate aiblity to retrievee light weight object at 110* shoulder  flexion with LUE.    Time  4    Period  Weeks    Status  Achieved   >110     OT SHORT TERM GOAL #4   Title  Pt will perfrom light home management/ basic cooking with supervision.    Time  4    Period  Weeks    Status  On-going   not yet addressed, pt can't eat yet       OT Long Term Goals - 12/19/19 1404      OT LONG TERM GOAL #1   Title  I with updated HEP.- 01/24/20    Time  12    Period  Weeks    Status  New      OT LONG TERM GOAL #2   Title  Pt will demonstrate improved LUE fine motor coordination for ADLS as evidenced by decreasing 9 hole peg test score to 42 secs or less.    Time  12    Period  Weeks    Status  New      OT LONG TERM GOAL #3   Title  Pt will retrieve a lightweight object at 120 shoulder flexion with LUE.    Time  12    Period  Weeks    Status  New      OT LONG TERM GOAL #4   Title  Pt will perform mod complex cooking/ home management modified independently    Time  12    Period  Weeks    Status  New      OT LONG TERM GOAL #5   Title  Pt will perfrom all basic ADLS modified independently.    Time  12    Period  Weeks    Status  Achieved   per pt report for BADLS           Plan - 12/19/19 1431    Clinical Impression Statement  For the reporting period of 10/26/19-12/19/19 pt demonstrates good overall progress towards short term goals with 3/4 goals met.. Pt can benefit from continued skilled occupational therapy to address: UE strenght, UE functional use, coordination deficits, decreased balance and functional mobility in order to maximize pt's safety and I with ADLs/IADLs.    Occupational performance deficits (Please refer to evaluation for details):  ADL's;IADL's;Leisure;Social Participation    Body Structure / Function / Physical Skills  ADL;UE functional use;Flexibility;Pain;FMC;ROM;Coordination;GMC;Sensation;Decreased knowledge of precautions;Decreased knowledge of use of DME;IADL;Dexterity;Strength;Mobility;Endurance;Balance;Vision     Rehab Potential  Good    OT Frequency  2x / week    OT Duration  12  weeks    OT Treatment/Interventions  Self-care/ADL training;Aquatic Therapy;DME and/or AE instruction;Patient/family education;Balance training;Passive range of motion;Gait Training;Paraffin;Cryotherapy;Fluidtherapy;Splinting;Therapist, nutritional;Contrast Bath;Electrical Stimulation;Moist Heat;Therapeutic exercise;Manual Therapy;Therapeutic activities;Neuromuscular education    Plan  UBE, standing tolerance, continue LUE coordination    Consulted and Agree with Plan of Care  Patient       Patient will benefit from skilled therapeutic intervention in order to improve the following deficits and impairments:   Body Structure / Function / Physical Skills: ADL, UE functional use, Flexibility, Pain, FMC, ROM, Coordination, GMC, Sensation, Decreased knowledge of precautions, Decreased knowledge of use of DME, IADL, Dexterity, Strength, Mobility, Endurance, Balance, Vision       Visit Diagnosis: Muscle weakness (generalized)  Other lack of coordination  Other disturbances of skin sensation  Other abnormalities of gait and mobility    Problem List Patient Active Problem List   Diagnosis Date Noted  . Lower extremity edema 11/24/2019  . Difficulty urinating 11/24/2019  . Dupuytren's contracture of left hand 09/20/2019  . Hyperlipidemia 08/24/2019  . Upper and lower extremity pain 08/24/2019  . OSA on CPAP 08/09/2019  . Copious oral secretions 08/09/2019  . Anemia of chronic disease   . Dysphagia due to recent cerebral infarction   . Diabetes mellitus type 2 in obese ([place])   . Morbid obesity ([place])   . Status post insertion of percutaneous endoscopic gastrostomy (PEG) tube ([place])   . History of CVA (cerebrovascular accident) 06/27/2019  . Essential hypertension 06/14/2019  . Renal insufficiency 05/30/2019  . Diplopia   . Chronic fatigue     Michael Bright 12/19/2019, 2:34 PM Theone Murdoch, OTR/L [phone] Phone: [phone] 2:35 PM 12/19/19 [facility] [address] [place], [place], [address] Phone: [phone]   Fax:  [phone]  Name: Michael Bright MRN: [id number] Date of Birth: 11-Mar-1956

## 2019-12-19 NOTE — Therapy (Signed)
[facility] [address] [place], [place], [address] Phone: [phone]   Fax:  [phone]  Physical Therapy Treatment/progress note  Patient Details  Name: Michael Bright MRN: [id number] Date of Birth: 09/26/1956 Referring Provider (PT): Alger Simons, MD    Progress Note  Reporting period 10/31/19 to 12/19/19  See Note below for Objective Data and Assessment of Progress/Goals   Encounter Date: 12/19/2019  PT End of Session - 12/19/19 1537    Visit Number  10    Number of Visits  25    Date for PT Re-Evaluation  01/29/20    Authorization Type  BCBS - 2021 primary, Medicare Secondary    PT Start Time  1532    PT Stop Time  1611    PT Time Calculation (min)  39 min    Equipment Utilized During Treatment  Gait belt    Activity Tolerance  Patient tolerated treatment well    Behavior During Therapy  [facility] for tasks assessed/performed       Past Medical History:  Diagnosis Date  . Acute CVA (cerebrovascular accident) ([place]) 05/30/2019  . Acute kidney injury ([place])   . Acute respiratory failure ([place])   . Aspiration pneumonia ([place]) 06/14/2019  . 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 Diabetes ([place])   . 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 (CVA) 08/09/2019  . Pneumonia    aspiration pneumonia  . Sepsis with acute renal failure without septic shock ([place])   . Septic shock ([place])   . Stroke ([place])   . Urinary tract infection   . Urinary tract infection without hematuria     Past Surgical History:  Procedure Laterality Date  . CHOLECYSTECTOMY    . IR GASTROSTOMY TUBE MOD SED  07/04/2019    There were no vitals filed for this visit.  Subjective Assessment - 12/19/19 1535    Subjective  Pt reports he is doing well. Not wearing brace as leg was more swollen but denies any pain.    Pertinent History  PMH: CVA (L lateral medullary syndrome) diabetes, HTN, OSA, morbid obesity.    Limitations   Standing;Walking;House hold activities    How long can you stand comfortably?  can stand approx. 10 minutes    How long can you walk comfortably?  20 minutes with the rollator    Patient Stated Goals  wants to walk better, walk more steady, wants to be able to get up more easily.    Currently in Pain?  No/denies    Pain Onset  More than a month ago    Pain Onset  More than a month ago                       [facility] Adult PT Treatment/Exercise - 12/19/19 1536      Transfers   Transfers  Sit to Stand;Stand to Sit    Sit to Stand  5: Supervision    Sit to Stand Details  Verbal cues for technique    Five time sit to stand comments   33 sec from mat. Pt initially bracing legs on mat but improved once leaned forward more    Stand to Sit  5: Supervision    Comments  Pt performed sit to stand from rollator parked a couple inches from cabinets trying to rise without pushing it back. Performed x 5. Was able to come up straighter without pushing back when cued to bring feet back more.  Ambulation/Gait   Ambulation/Gait  Yes    Ambulation/Gait Assistance  5: Supervision    Ambulation Distance (Feet)  345 Feet    Assistive device  Rollator    Gait Pattern  Step-through pattern;Decreased dorsiflexion - left    Ambulation Surface  Level;Indoor      Neuro Re-ed    Neuro Re-ed Details   In // bars: standing on pillow without UE support with eyes open x 30 sec, head turns left/right x 10, head turns up/down x 10, eyes closed x 30 sec. CGA for safety. Alternating toe taps on cone x 10 then tapping 3 cones x 5 each leg with light UE support. Sidestepping in // bars 6' x 6, marching gait forwards then backwards steps 6' x 6, reciprocal steps over 3 cones with UE support x 3 laps with verbal cues for form.      Exercises   Exercises  Other Exercises    Other Exercises   Step-ups on 6" step x 10 bilateral with UE support             PT Education - 12/19/19 1930    Education  Details  Pt to continue with current HEP    Person(s) Educated  Patient    Methods  Explanation    Comprehension  Verbalized understanding       PT Short Term Goals - 12/14/19 2127      PT SHORT TERM GOAL #1   Title  Patient will be independent with initial HEP in order to build upon functional gains made in therapy.  ALL STGS DUE 01/11/20    Baseline  pt will benefit from on-going additions to HEP    Time  4    Period  Weeks    Status  On-going    Target Date  01/11/20      PT SHORT TERM GOAL #2   Title  Patient to improve BERG balance test to at least 38/56 for decreased fall risk.    Baseline  35/56 on 12/13/19    Time  4    Period  Weeks    Status  Revised      PT SHORT TERM GOAL #3   Title  Patient will decrease TUG time with bariatric rollator to 24 seconds of less to demo decr fall risk.    Baseline  30.22 seconds on 10/31/19; 25.93 sec 12/11/2019    Time  4    Period  Weeks    Status  Revised      PT SHORT TERM GOAL #4   Title  Patient will improve gait velocity with bariatric rollator to at least 2.0 ft/sec to demo a limited community ambulator and decr fall risk.    Baseline  1.1 ft/sec with L AFO and bariatric rollator; 1.6 ft/sec 12/11/2019    Time  4    Period  Weeks    Status  Revised      PT SHORT TERM GOAL #5   Title  Patient will decrease 5x sit <> stand time to 25 seconds of less from elevated mat table with UE support in order to demo improved functional LE strength.    Baseline  31.06 seconds; 27.19 sec 12/11/2019    Time  4    Period  Weeks    Status  On-going        PT Long Term Goals - 11/20/19 1711      PT LONG TERM GOAL #1   Title  Patient will  be independent with final HEP in order to build upon functional gains made in therapy. ALL LTGS DUE 01/29/20    Time  13   due to delay in scheduling   Period  Weeks    Status  New      PT LONG TERM GOAL #2   Title  Berg score to imporve to at least 40/56 for decreased fall risk.    Time  13     Period  Weeks    Status  Revised      PT LONG TERM GOAL #3   Title  Patient will decrease 5x sit <> stand time to 19 seconds of less from elevated mat table vs. standard chair with UE support in order to demo improved functional LE strength.    Time  13    Period  Weeks    Status  New      PT LONG TERM GOAL #4   Title  Patient will improve gait velocity with bariatric rollator vs. LRAD to at least 2.6 ft/sec to demo improved community mobility.    Time  13    Period  Weeks    Status  New      PT LONG TERM GOAL #5   Title  Patient will decrease TUG time with bariatric rollator vs. LRAD to 19 seconds of less to demo decr fall risk.    Time  13    Period  Weeks    Status  New      PT LONG TERM GOAL #6   Title  Patient will ambulate at least 300' with supervision over level indoor and outdoor surfaces with LRAD  with supervision in order to improve community mobility.    Time  13    Period  Weeks    Status  New            Plan - 12/19/19 1932    Clinical Impression Statement  Pt met 2/5 STGs at last visit and goals were updated. Pt still fall risk based on Berg, TUG and 5x sit to stand scores. PT focused improving transfer safety from rollator today as patient tends to push back on rollator. Pt was able to perform with bringing feet back more but still advising to park against wall. Pt's left leg does fatigue with activities. Pt had decreased stability on compliant surface. pt will continue to benefit from skilled PT to continue to improve balance, strength and functional mobility.    Personal Factors and Comorbidities  Comorbidity 3+;Past/Current Experience;Time since onset of injury/illness/exacerbation    Comorbidities  diabetes, HTN, OSA, morbid obesity.    Examination-Activity Limitations  Squat;Transfers;Locomotion Level;Stairs    Examination-Participation Restrictions  Community Activity    Stability/Clinical Decision Making  Evolving/Moderate complexity    Rehab Potential   Good    PT Frequency  2x / week    PT Duration  12 weeks    PT Treatment/Interventions  ADLs/Self Care Home Management;Balance training;Therapeutic exercise;Therapeutic activities;Functional mobility training;Stair training;Gait training;DME Instruction;Aquatic Therapy;Neuromuscular re-education;Patient/family education;Orthotic Fit/Training;Energy conservation    PT Next Visit Plan  continue work on transfers, functional strength (step ups, SLS),balance strategies on compliant surfaces, work in parallel bars to decrease UE support, ankle weight for LLE to increase proprioception    PT Home Exercise Plan  [id number] ([place])    Consulted and Agree with Plan of Care  Patient       Patient will benefit from skilled therapeutic intervention in order to improve the following deficits and  impairments:  Abnormal gait, Decreased activity tolerance, Decreased balance, Decreased coordination, Decreased range of motion, Decreased knowledge of use of DME, Decreased knowledge of precautions, Decreased endurance, Decreased strength, Difficulty walking, Postural dysfunction, Impaired sensation, Obesity, Pain  Visit Diagnosis: Other abnormalities of gait and mobility  Muscle weakness (generalized)     Problem List Patient Active Problem List   Diagnosis Date Noted  . Lower extremity edema 11/24/2019  . Difficulty urinating 11/24/2019  . Dupuytren's contracture of left hand 09/20/2019  . Hyperlipidemia 08/24/2019  . Upper and lower extremity pain 08/24/2019  . OSA on CPAP 08/09/2019  . Copious oral secretions 08/09/2019  . Anemia of chronic disease   . Dysphagia due to recent cerebral infarction   . Diabetes mellitus type 2 in obese ([place])   . Morbid obesity ([place])   . Status post insertion of percutaneous endoscopic gastrostomy (PEG) tube ([place])   . History of CVA (cerebrovascular accident) 06/27/2019  . Essential hypertension 06/14/2019  . Renal insufficiency 05/30/2019  . Diplopia   .  Chronic fatigue     Electa Sniff, PT, DPT, NCS 12/19/2019, 7:36 PM  [facility] [address] [place], [place], [address] Phone: [phone]   Fax:  [phone]  Name: Michael Bright MRN: [id number] Date of Birth: 12/05/1955

## 2019-12-20 ENCOUNTER — Ambulatory Visit: Payer: BC Managed Care – PPO | Admitting: Skilled Nursing Facility1

## 2019-12-20 ENCOUNTER — Ambulatory Visit: Payer: BC Managed Care – PPO

## 2019-12-20 DIAGNOSIS — R1312 Dysphagia, oropharyngeal phase: Secondary | ICD-10-CM

## 2019-12-20 NOTE — Therapy (Signed)
[facility] [address] [place], [place], [address] Phone: [phone]   Fax:  [phone]  Speech Language Pathology Treatment  Patient Details  Name: Michael Bright MRN: [id number] Date of Birth: Feb 25, 1956 Referring Provider (SLP): Dr. Naaman Plummer   Encounter Date: 12/20/2019  End of Session - 12/20/19 2220    Visit Number  12    Number of Visits  25    Date for SLP Re-Evaluation  02/16/20    SLP Start Time  1450    SLP Stop Time   1530    SLP Time Calculation (min)  40 min    Activity Tolerance  Patient tolerated treatment well       Past Medical History:  Diagnosis Date  . Acute CVA (cerebrovascular accident) ([place]) 05/30/2019  . Acute kidney injury ([place])   . Acute respiratory failure ([place])   . Aspiration pneumonia ([place]) 06/14/2019  . 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 Diabetes ([place])   . 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 (CVA) 08/09/2019  . Pneumonia    aspiration pneumonia  . Sepsis with acute renal failure without septic shock ([place])   . Septic shock ([place])   . Stroke ([place])   . Urinary tract infection   . Urinary tract infection without hematuria     Past Surgical History:  Procedure Laterality Date  . CHOLECYSTECTOMY    . IR GASTROSTOMY TUBE MOD SED  07/04/2019    There were no vitals filed for this visit.  Subjective Assessment - 12/20/19 2214    Subjective  Pt brought shake today - near-nectar consistency.    Currently in Pain?  No/denies            ADULT SLP TREATMENT - 12/20/19 2215      General Information   Behavior/Cognition  Alert;Pleasant mood;Cooperative      Treatment Provided   Treatment provided  Dysphagia      Cognitive-Linquistic Treatment   Skilled Treatment  Skilled therapeutic swallow skills were used by SLP to conduct ST today with pt. Pt again denies any overt s/sx aspiration PNA since last session. Pt cont on room air. SLP observed pt  with near-nectar liquids with NMES placed #3b; pt at therapeutic intensity at 15.0 mA, incr'd to 15.5 mA 12 minutes after NMES turned on. Pt without coughing today - SLP reminded pt of small sips initially. Pt finished shake during session so SLP had pt return demonstrate EMST - req'd cues for emptying breath and full breath prior to EMST exhale. Cheeks cont to be full of air instead of tight against teeth. CTAR completed here with 60-second hold; WNL procedure.       Assessment / Recommendations / Plan   Plan  Continue with current plan of care      Progression Toward Goals   Progression toward goals  Progressing toward goals         SLP Short Term Goals - 12/15/19 1510      SLP SHORT TERM GOAL #1   Title  Pt will complete dysphagia HEP with rare min A x 3 sessions.    Status  Partially Met      SLP SHORT TERM GOAL #2   Title  Patient will tolerate electrode placement for NMES x 3 sessions.    Baseline  12-05-19, 12-11-19    Status  Achieved      SLP SHORT TERM GOAL #3   Title  Patient will tell SLP 3 s/sx of  aspiration PNA.    Status  Achieved       SLP Long Term Goals - 12/20/19 2221      SLP LONG TERM GOAL #1   Title  Pt will demonstrate dysphagia HEP with modified independence x3 sessions.    Time  4    Period  Weeks    Status  On-going      SLP LONG TERM GOAL #2   Title  Pt will report thorough oral care prior to water/ice chips x6 sessions.    Baseline  11-29-19, 12-15-19 12-20-19    Time  4    Period  Weeks    Status  On-going      SLP LONG TERM GOAL #3   Title  pt will tolerate electrode placement for 7 therapy sessions    Baseline  12-11-19, 12-15-19, 12-19-19, 12-20-19    Time  4    Period  Weeks    Status  On-going      SLP LONG TERM GOAL #4   Title  pt will swallow within 2 seconds of bolus presentation to oral cavity, or in subsequent swallows at least 80% with at least 60 swallows    Time  4    Period  Weeks    Status  On-going       Plan - 12/20/19 2221     Clinical Impression Statement  Pt enters ST room without hydrophonia present.  He continues PEG dependent since hospitalization. See "skilled treatment" for  more details. He cont to perform nectar trials at home and also reports taking sips of regular water in supine after oral care at home. Patient has been completing pharyngeal exercises and RMT (expiratory) with improvement in function noted on previous MBS (10-18-19).  I recommend cont'd skilled ST to address dysphagia for training in aspiration precautions, pharyngeal exercises, and  NMES to improve pt's swallow function for possible advancement to comfort feeds. Advise nectar thick liquid trials and (per MBSS) thin  liquids in supine as much as pt is comfortable.    Speech Therapy Frequency  3x / week   or 25 total visits   Duration  --   8 weeks or 17 visits   Treatment/Interventions  Aspiration precaution training;Cueing hierarchy;SLP instruction and feedback;Pharyngeal strengthening exercises;NMES;Compensatory techniques;Diet toleration management by SLP;Trials of upgraded texture/liquids;Patient/family education    Potential to Achieve Goals  Good    Potential Considerations  Severity of impairments    SLP Home Exercise Plan  Continue swallow ex and RMT per [facility] SLP; bring exercises to next session    Consulted and Agree with Plan of Care  Patient;Family member/caregiver       Patient will benefit from skilled therapeutic intervention in order to improve the following deficits and impairments:   Oropharyngeal dysphagia    Problem List Patient Active Problem List   Diagnosis Date Noted  . Lower extremity edema 11/24/2019  . Difficulty urinating 11/24/2019  . Dupuytren's contracture of left hand 09/20/2019  . Hyperlipidemia 08/24/2019  . Upper and lower extremity pain 08/24/2019  . OSA on CPAP 08/09/2019  . Copious oral secretions 08/09/2019  . Anemia of chronic disease   . Dysphagia due to recent cerebral infarction   .  Diabetes mellitus type 2 in obese ([place])   . Morbid obesity ([place])   . Status post insertion of percutaneous endoscopic gastrostomy (PEG) tube ([place])   . History of CVA (cerebrovascular accident) 06/27/2019  . Essential hypertension 06/14/2019  . Renal insufficiency 05/30/2019  .  Diplopia   . Chronic fatigue     Margeaux Swantek ,MS, CCC-SLP  12/20/2019, 10:22 PM  [facility] [address] [place], [place], [address] Phone: [phone]   Fax:  [phone]   Name: Michael Bright MRN: [id number] Date of Birth: April 07, 1956

## 2019-12-22 ENCOUNTER — Ambulatory Visit: Payer: BC Managed Care – PPO | Admitting: Occupational Therapy

## 2019-12-22 ENCOUNTER — Ambulatory Visit: Payer: BC Managed Care – PPO | Admitting: Physical Therapy

## 2019-12-22 ENCOUNTER — Other Ambulatory Visit: Payer: Self-pay

## 2019-12-22 ENCOUNTER — Ambulatory Visit: Payer: BC Managed Care – PPO

## 2019-12-22 DIAGNOSIS — R2689 Other abnormalities of gait and mobility: Secondary | ICD-10-CM

## 2019-12-22 DIAGNOSIS — M6281 Muscle weakness (generalized): Secondary | ICD-10-CM

## 2019-12-22 DIAGNOSIS — R278 Other lack of coordination: Secondary | ICD-10-CM

## 2019-12-22 DIAGNOSIS — R1312 Dysphagia, oropharyngeal phase: Secondary | ICD-10-CM

## 2019-12-22 DIAGNOSIS — R208 Other disturbances of skin sensation: Secondary | ICD-10-CM

## 2019-12-22 DIAGNOSIS — R2681 Unsteadiness on feet: Secondary | ICD-10-CM

## 2019-12-22 NOTE — Therapy (Signed)
[facility] [address] [place], [place], [address] Phone: [phone]   Fax:  [phone]  Occupational Therapy Treatment  Patient Details  Name: Michael Bright MRN: [id number] Date of Birth: Jun 23, 1956 Referring Provider (OT): Dr. Riley Bright   Encounter Date: 12/22/2019  OT End of Session - 12/22/19 1239    Visit Number  11    Number of Visits  25    Date for OT Re-Evaluation  01/24/20    Authorization Type  [employer]    Authorization - Visit Number  11    Authorization - Number of Visits  20    OT Start Time  1237    OT Stop Time  1315    OT Time Calculation (min)  38 min    Activity Tolerance  Patient tolerated treatment well    Behavior During Therapy  Michael Bright for tasks assessed/performed       Past Medical History:  Diagnosis Date  . Acute CVA (cerebrovascular accident) ([facility]) 05/30/2019  . Acute kidney injury ([facility])   . Acute respiratory failure ([facility])   . Aspiration pneumonia ([facility]) 06/14/2019  . 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 Diabetes ([facility])   . 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 (CVA) 08/09/2019  . Pneumonia    aspiration pneumonia  . Sepsis with acute renal failure without septic shock ([facility])   . Septic shock ([facility])   . Stroke ([facility])   . Urinary tract infection   . Urinary tract infection without hematuria     Past Surgical History:  Procedure Laterality Date  . CHOLECYSTECTOMY    . IR GASTROSTOMY TUBE MOD SED  07/04/2019    There were no vitals filed for this visit.  Subjective Assessment - 12/22/19 1239    Subjective   Denies pain    Patient is accompanied by:  Family member   wife   Pertinent History  Michael Bright is a 64 y.o. 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 He was readmitted on 07/20/19 with septic shock due to Klebsiella pneumoniae bacteremia with UTI and hypoxic respiratory failure bilateral lung infiltrates due to rhino virus. He was intubated briefly from 10/1-10/2 and treated with broad spectrum antibiotics till cultures finalized. Antibiotics narrowed to cefdinir on 10/5 with recommendations to complete 2 weeks total antibiotic course.  Bright course significant for AKI with abnormal LFTs due to shocked liver, intermittent hypoxic episode as well as significant oral secretions with dysphonia. Discharged home 08/08/19.    Limitations  NPO, feeding tube    Patient Stated Goals  improve coordination and walking    Currently in Pain?  No/denies               Treatment: Purdue pegboard with LUE for fine motor coordination and in hand manipulation min- mod  Difficulty, min v.c for positioning, removing with in hand manipulation, mod difficulty/ drops. Arm bike x 6 mins level 4 for conditioning/ reciprocal movement. Functional overhead reaching with LUE to place and remove graded clothespins for sustained pinch 1-8 lbs              OT Short Term Goals - 12/08/19 1412      OT SHORT TERM GOAL #1   Title  I with HEP.  Time  4    Period  Weeks    Status  Achieved    Target Date  11/25/19      OT SHORT TERM GOAL #2   Title  Pt will demonstrate improved fine motor coordination for ADLs as evidenced by decreasing 9 hole peg test score to 50 secs or less    Baseline  RUE 35.41, LUE 57.78    Time  4    Period  Weeks    Status  Achieved   47.31     OT SHORT TERM GOAL #3   Title  Pt will demonstrate aiblity to retrievee light weight object at 110* shoulder flexion with LUE.    Time  4    Period  Weeks    Status  Achieved   >110     OT SHORT TERM GOAL #4   Title  Pt will perfrom light home management/ basic cooking with supervision.    Time  4     Period  Weeks    Status  On-going   not yet addressed, pt can't eat yet       OT Long Term Goals - 12/19/19 1404      OT LONG TERM GOAL #1   Title  I with updated HEP.- 01/24/20    Time  12    Period  Weeks    Status  New      OT LONG TERM GOAL #2   Title  Pt will demonstrate improved LUE fine motor coordination for ADLS as evidenced by decreasing 9 hole peg test score to 42 secs or less.    Time  12    Period  Weeks    Status  New      OT LONG TERM GOAL #3   Title  Pt will retrieve a lightweight object at 120 shoulder flexion with LUE.    Time  12    Period  Weeks    Status  New      OT LONG TERM GOAL #4   Title  Pt will perform mod complex cooking/ home management modified independently    Time  12    Period  Weeks    Status  New      OT LONG TERM GOAL #5   Title  Pt will perfrom all basic ADLS modified independently.    Time  12    Period  Weeks    Status  Achieved   per pt report for BADLS             Patient will benefit from skilled therapeutic intervention in order to improve the following deficits and impairments:           Visit Diagnosis: Muscle weakness (generalized)  Other lack of coordination  Other disturbances of skin sensation  Other abnormalities of gait and mobility    Problem List Patient Active Problem List   Diagnosis Date Noted  . Lower extremity edema 11/24/2019  . Difficulty urinating 11/24/2019  . Dupuytren's contracture of left hand 09/20/2019  . Hyperlipidemia 08/24/2019  . Upper and lower extremity pain 08/24/2019  . OSA on CPAP 08/09/2019  . Copious oral secretions 08/09/2019  . Anemia of chronic disease   . Dysphagia due to recent cerebral infarction   . Diabetes mellitus type 2 in obese ([facility])   . Morbid obesity ([facility])   . Status post insertion of percutaneous endoscopic gastrostomy (PEG) tube ([facility])   . History of CVA (cerebrovascular accident) 06/27/2019  .  Essential hypertension 06/14/2019  . Renal  insufficiency 05/30/2019  . Diplopia   . Chronic fatigue     Michael Bright 12/22/2019, 12:43 PM  [facility] [address] [place], [place], [address] Phone: [phone]   Fax:  [phone]  Name: Michael Bright MRN: [id number] Date of Birth: August 18, 1956

## 2019-12-22 NOTE — Therapy (Signed)
[facility] [address] [place], [place], [address] Phone: [phone]   Fax:  [phone]  Speech Language Pathology Treatment  Patient Details  Name: Michael Bright MRN: [id number] Date of Birth: 04/23/1956 Referring Provider (SLP): Dr. Naaman Plummer   Encounter Date: 12/22/2019  End of Session - 12/22/19 1447    Visit Number  13    Number of Visits  25    Date for SLP Re-Evaluation  02/16/20    SLP Start Time  9826    SLP Stop Time   1443    SLP Time Calculation (min)  40 min    Activity Tolerance  Patient tolerated treatment well       Past Medical History:  Diagnosis Date  . Acute CVA (cerebrovascular accident) ([facility]) 05/30/2019  . Acute kidney injury ([place])   . Acute respiratory failure ([place])   . Aspiration pneumonia ([place]) 06/14/2019  . 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 Diabetes ([place])   . 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 (CVA) 08/09/2019  . Pneumonia    aspiration pneumonia  . Sepsis with acute renal failure without septic shock ([place])   . Septic shock ([place])   . Stroke ([place])   . Urinary tract infection   . Urinary tract infection without hematuria     Past Surgical History:  Procedure Laterality Date  . CHOLECYSTECTOMY    . IR GASTROSTOMY TUBE MOD SED  07/04/2019    There were no vitals filed for this visit.  Subjective Assessment - 12/22/19 1403    Subjective  Pt brought shake today - near-nectar consistency.    Currently in Pain?  No/denies            ADULT SLP TREATMENT - 12/22/19 1409      General Information   Behavior/Cognition  Alert;Pleasant mood;Cooperative      Treatment Provided   Treatment provided  Dysphagia      Cognitive-Linquistic Treatment   Skilled Treatment  SLP conducted skilled dysphagia ST today with pt - pt again denies any overt s/sx aspiration PNA since last session. Pt did "a little" supine/reclined swallowing after thorough oral care  Wednesday. Pt cont on room air. SLP observed pt with near-nectar liquids with NMES placed #3a; pt at therapeutic intensity at 17.0 mA. Pt reports that that bolus "is pretty much gone" after the second swallow. "It's less than it used to be" pt stated. Pt without coughing today - SLP reminded pt of small sips and hard swallows x4 during the session. Pt finished shake in 25 minutes during session so SLP had pt return demonstrate EMST - pt still req'd cues for taking full breath prior to EMST exhale. Cheeks cont to be full of air instead of tight against teeth. SLP encouraged pt to keep cheeks in - SLP told pt he should do the EMST in front of  mirror at home to assist with this.       Assessment / Recommendations / Plan   Plan  Continue with current plan of care      Progression Toward Goals   Progression toward goals  Progressing toward goals         SLP Short Term Goals - 12/15/19 1510      SLP SHORT TERM GOAL #1   Title  Pt will complete dysphagia HEP with rare min A x 3 sessions.    Status  Partially Met      SLP SHORT TERM GOAL #2  Title  Patient will tolerate electrode placement for NMES x 3 sessions.    Baseline  12-05-19, 12-11-19    Status  Achieved      SLP SHORT TERM GOAL #3   Title  Patient will tell SLP 3 s/sx of aspiration PNA.    Status  Achieved       SLP Long Term Goals - 12/22/19 1449      SLP LONG TERM GOAL #1   Title  Pt will demonstrate dysphagia HEP with modified independence x3 sessions.    Time  4    Period  Weeks    Status  On-going      SLP LONG TERM GOAL #2   Title  Pt will report thorough oral care prior to water/ice chips x6 sessions.    Baseline  11-29-19, 12-15-19 12-20-19    Time  4    Period  Weeks    Status  On-going      SLP LONG TERM GOAL #3   Title  pt will tolerate electrode placement for 7 therapy sessions    Baseline  12-11-19, 12-15-19, 12-19-19, 12-20-19    Time  4    Period  Weeks    Status  On-going      SLP LONG TERM GOAL #4   Title   pt will swallow within 2 seconds of bolus presentation to oral cavity, or in subsequent swallows at least 80% with at least 60 swallows    Time  4    Period  Weeks    Status  On-going       Plan - 12/22/19 1448    Clinical Impression Statement  Pt enters ST room without hydrophonia present.  He continues PEG dependent since hospitalization. See "skilled treatment" for  more details. He cont to perform nectar trials at home and also reports taking sips of regular water in supine after oral care at home. Patient has been completing pharyngeal exercises and RMT (expiratory) with improvement in function noted on previous MBS (10-18-19).  I recommend cont'd skilled ST to address dysphagia for training in aspiration precautions, pharyngeal exercises, and  NMES to improve pt's swallow function for possible advancement to comfort feeds. Advise nectar thick liquid trials and (per MBSS) thin  liquids in supine as much as pt is comfortable.    Speech Therapy Frequency  3x / week   or 25 total visits   Duration  --   8 weeks or 17 visits   Treatment/Interventions  Aspiration precaution training;Cueing hierarchy;SLP instruction and feedback;Pharyngeal strengthening exercises;NMES;Compensatory techniques;Diet toleration management by SLP;Trials of upgraded texture/liquids;Patient/family education    Potential to Achieve Goals  Good    Potential Considerations  Severity of impairments    SLP Home Exercise Plan  Continue swallow ex and RMT per [facility] SLP; bring exercises to next session    Consulted and Agree with Plan of Care  Patient;Family member/caregiver       Patient will benefit from skilled therapeutic intervention in order to improve the following deficits and impairments:   Oropharyngeal dysphagia    Problem List Patient Active Problem List   Diagnosis Date Noted  . Lower extremity edema 11/24/2019  . Difficulty urinating 11/24/2019  . Dupuytren's contracture of left hand 09/20/2019  .  Hyperlipidemia 08/24/2019  . Upper and lower extremity pain 08/24/2019  . OSA on CPAP 08/09/2019  . Copious oral secretions 08/09/2019  . Anemia of chronic disease   . Dysphagia due to recent cerebral infarction   .  Diabetes mellitus type 2 in obese ([place])   . Morbid obesity ([place])   . Status post insertion of percutaneous endoscopic gastrostomy (PEG) tube ([place])   . History of CVA (cerebrovascular accident) 06/27/2019  . Essential hypertension 06/14/2019  . Renal insufficiency 05/30/2019  . Diplopia   . Chronic fatigue     Anum Palecek ,MS, CCC-SLP  12/22/2019, 2:50 PM  [facility] [address] [place], [place], [address] Phone: [phone]   Fax:  [phone]   Name: Michael Bright MRN: [id number] Date of Birth: [birth year]

## 2019-12-23 ENCOUNTER — Ambulatory Visit: Payer: BC Managed Care – PPO | Attending: Internal Medicine

## 2019-12-23 DIAGNOSIS — Z23 Encounter for immunization: Secondary | ICD-10-CM | POA: Insufficient documentation

## 2019-12-23 NOTE — Progress Notes (Signed)
   Covid-19 Vaccination Clinic  Name:  Michael Bright    MRN: [id number] DOB: Nov 11, 1955  12/23/2019  [name] was observed post Covid-19 immunization for 15 minutes without incident. He was provided with Vaccine Information Sheet and instruction to access the V-Safe system.   [name] was instructed to call 911 with any severe reactions post vaccine: Marland Kitchen Difficulty breathing  . Swelling of face and throat  . A fast heartbeat  . A bad rash all over body  . Dizziness and weakness   Immunizations Administered    Name Date Dose VIS Date Route   Pfizer COVID-19 Vaccine 12/23/2019  9:39 AM 0.3 mL 09/29/2019 Intramuscular   Manufacturer: [employer], [employer]   Lot: [id number]   NDC: [address]-9

## 2019-12-23 NOTE — Therapy (Signed)
[facility] [address] [place], [place], [address] Phone: [phone]   Fax:  [phone]  Physical Therapy Treatment  Patient Details  Name: Michael Bright MRN: [id number] Date of Birth: 08-01-1956 Referring Provider (PT): Faith Rogue, MD   Encounter Date: 12/22/2019  PT End of Session - 12/23/19 0946    Visit Number  11    Number of Visits  25    Date for PT Re-Evaluation  01/29/20    Authorization Type  BCBS - 2021 primary, Medicare Secondary    PT Start Time  1319    PT Stop Time  1359    PT Time Calculation (min)  40 min    Equipment Utilized During Treatment  Gait belt    Activity Tolerance  Patient tolerated treatment well    Behavior During Therapy  [facility] for tasks assessed/performed       Past Medical History:  Diagnosis Date  . Acute CVA (cerebrovascular accident) ([facility]) 05/30/2019  . Acute kidney injury ([facility])   . Acute respiratory failure ([facility])   . Aspiration pneumonia ([facility]) 06/14/2019  . 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 Diabetes ([facility])   . 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 (CVA) 08/09/2019  . Pneumonia    aspiration pneumonia  . Sepsis with acute renal failure without septic shock ([facility])   . Septic shock ([facility])   . Stroke ([facility])   . Urinary tract infection   . Urinary tract infection without hematuria     Past Surgical History:  Procedure Laterality Date  . CHOLECYSTECTOMY    . IR GASTROSTOMY TUBE MOD SED  07/04/2019    There were no vitals filed for this visit.  Subjective Assessment - 12/22/19 1319    Subjective  Sleeping better, as my neck is not so stiff.    Pertinent History  PMH: CVA (L lateral medullary syndrome) diabetes, HTN, OSA, morbid obesity.    Limitations  Standing;Walking;House hold activities    How long can you stand comfortably?  can stand approx. 10 minutes    How long can you walk comfortably?  20 minutes with the rollator    Patient  Stated Goals  wants to walk better, walk more steady, wants to be able to get up more easily.    Currently in Pain?  No/denies    Pain Onset  More than a month ago    Pain Onset  More than a month ago                       [facility] Adult PT Treatment/Exercise - 12/23/19 0001      Transfers   Transfers  Sit to Stand;Stand to Sit    Sit to Stand  5: Supervision    Stand to Sit  5: Supervision    Number of Reps  Other reps (comment)   3 reps from walker seat; x 3 reps from mat     Ambulation/Gait   Ambulation/Gait  Yes    Ambulation/Gait Assistance  5: Supervision    Ambulation/Gait Assistance Details  Initial cues for L step length, but overall, pt improved with smooth, coordinated gait pattern    Ambulation Distance (Feet)  460 Feet   60 ft x 4; 360 ft   Assistive device  Rollator    Gait Pattern  Step-through pattern;Decreased dorsiflexion - left    Ambulation Surface  Level;Indoor      Neuro Re-ed    Neuro Re-ed Details  In parallel bars:  Step taps to 2" block with lessening UE support, 2 sets x 10 reps, then stepping over 2" block, x 10 reps each side with UE support.  Forwards/back gait in parallel bars with BUE>1 UE support, then marching forward/back 5 reps each.  Then sidestepping R and L in parallel bars x 3 reps.  Alternating step taps x 10 to cones, then consecutive step taps to cones x 10 reps with BUE>1 UE support intermittently.        Knee/Hip Exercises: Aerobic   Stepper  Seated SciFit Stepper, Level 2, 4 extremities x 5 minutes for leg strengthening               PT Short Term Goals - 12/14/19 2127      PT SHORT TERM GOAL #1   Title  Patient will be independent with initial HEP in order to build upon functional gains made in therapy.  ALL STGS DUE 01/11/20    Baseline  pt will benefit from on-going additions to HEP    Time  4    Period  Weeks    Status  On-going    Target Date  01/11/20      PT SHORT TERM GOAL #2   Title  Patient to  improve BERG balance test to at least 38/56 for decreased fall risk.    Baseline  35/56 on 12/13/19    Time  4    Period  Weeks    Status  Revised      PT SHORT TERM GOAL #3   Title  Patient will decrease TUG time with bariatric rollator to 24 seconds of less to demo decr fall risk.    Baseline  30.22 seconds on 10/31/19; 25.93 sec 12/11/2019    Time  4    Period  Weeks    Status  Revised      PT SHORT TERM GOAL #4   Title  Patient will improve gait velocity with bariatric rollator to at least 2.0 ft/sec to demo a limited community ambulator and decr fall risk.    Baseline  1.1 ft/sec with L AFO and bariatric rollator; 1.6 ft/sec 12/11/2019    Time  4    Period  Weeks    Status  Revised      PT SHORT TERM GOAL #5   Title  Patient will decrease 5x sit <> stand time to 25 seconds of less from elevated mat table with UE support in order to demo improved functional LE strength.    Baseline  31.06 seconds; 27.19 sec 12/11/2019    Time  4    Period  Weeks    Status  On-going        PT Long Term Goals - 11/20/19 1711      PT LONG TERM GOAL #1   Title  Patient will be independent with final HEP in order to build upon functional gains made in therapy. ALL LTGS DUE 01/29/20    Time  13   due to delay in scheduling   Period  Weeks    Status  New      PT LONG TERM GOAL #2   Title  Berg score to imporve to at least 40/56 for decreased fall risk.    Time  13    Period  Weeks    Status  Revised      PT LONG TERM GOAL #3   Title  Patient will decrease 5x sit <> stand time  to 19 seconds of less from elevated mat table vs. standard chair with UE support in order to demo improved functional LE strength.    Time  13    Period  Weeks    Status  New      PT LONG TERM GOAL #4   Title  Patient will improve gait velocity with bariatric rollator vs. LRAD to at least 2.6 ft/sec to demo improved community mobility.    Time  13    Period  Weeks    Status  New      PT LONG TERM GOAL #5   Title   Patient will decrease TUG time with bariatric rollator vs. LRAD to 19 seconds of less to demo decr fall risk.    Time  13    Period  Weeks    Status  New      PT LONG TERM GOAL #6   Title  Patient will ambulate at least 300' with supervision over level indoor and outdoor surfaces with LRAD  with supervision in order to improve community mobility.    Time  13    Period  Weeks    Status  New            Plan - 12/23/19 0946    Clinical Impression Statement  Pt motivated for walking today in PT session, with 2 bouts of longer distances in gym area.  Pt with initial cues for L foot clearance, but overall improved gait pattern.  Utilized seated SciFit Stepper this visit, for the first time, for lower extremity strengthening and endurance; pt able to maintain speed >90 for most of the 5 minutes of activity.  He will continue to benefit from skilled PT to further address balance, strenght and gait independence towards goals.    Personal Factors and Comorbidities  Comorbidity 3+;Past/Current Experience;Time since onset of injury/illness/exacerbation    Comorbidities  diabetes, HTN, OSA, morbid obesity.    Examination-Activity Limitations  Squat;Transfers;Locomotion Level;Stairs    Examination-Participation Restrictions  Community Activity    Stability/Clinical Decision Making  Evolving/Moderate complexity    Rehab Potential  Good    PT Frequency  2x / week    PT Duration  12 weeks    PT Treatment/Interventions  ADLs/Self Care Home Management;Balance training;Therapeutic exercise;Therapeutic activities;Functional mobility training;Stair training;Gait training;DME Instruction;Aquatic Therapy;Neuromuscular re-education;Patient/family education;Orthotic Fit/Training;Energy conservation    PT Next Visit Plan  continue work on transfers, functional strength (step ups, SLS),balance strategies on compliant surfaces, work in parallel bars to decrease UE support, ankle weight for LLE to increase  proprioception    PT Home Exercise Plan  [email or web address] ([employer])    Consulted and Agree with Plan of Care  Patient       Patient will benefit from skilled therapeutic intervention in order to improve the following deficits and impairments:  Abnormal gait, Decreased activity tolerance, Decreased balance, Decreased coordination, Decreased range of motion, Decreased knowledge of use of DME, Decreased knowledge of precautions, Decreased endurance, Decreased strength, Difficulty walking, Postural dysfunction, Impaired sensation, Obesity, Pain  Visit Diagnosis: Unsteadiness on feet  Other abnormalities of gait and mobility  Muscle weakness (generalized)     Problem List Patient Active Problem List   Diagnosis Date Noted  . Lower extremity edema 11/24/2019  . Difficulty urinating 11/24/2019  . Dupuytren's contracture of left hand 09/20/2019  . Hyperlipidemia 08/24/2019  . Upper and lower extremity pain 08/24/2019  . OSA on CPAP 08/09/2019  . Copious oral secretions 08/09/2019  . Anemia of chronic  disease   . Dysphagia due to recent cerebral infarction   . Diabetes mellitus type 2 in obese ([place])   . Morbid obesity ([place])   . Status post insertion of percutaneous endoscopic gastrostomy (PEG) tube ([place])   . History of CVA (cerebrovascular accident) 06/27/2019  . Essential hypertension 06/14/2019  . Renal insufficiency 05/30/2019  . Diplopia   . Chronic fatigue     Meela Wareing W. 12/23/2019, 9:49 AM Frazier Butt., PT  [facility] [address] [place], [place], [address] Phone: [phone]   Fax:  [phone]  Name: Barack Nicodemus MRN: [id number] Date of Birth: 01/30/1956

## 2019-12-26 ENCOUNTER — Ambulatory Visit: Payer: BC Managed Care – PPO | Admitting: Occupational Therapy

## 2019-12-26 ENCOUNTER — Ambulatory Visit: Payer: BC Managed Care – PPO

## 2019-12-26 ENCOUNTER — Other Ambulatory Visit: Payer: Self-pay

## 2019-12-26 DIAGNOSIS — R2681 Unsteadiness on feet: Secondary | ICD-10-CM

## 2019-12-26 DIAGNOSIS — R1312 Dysphagia, oropharyngeal phase: Secondary | ICD-10-CM | POA: Diagnosis not present

## 2019-12-26 DIAGNOSIS — R278 Other lack of coordination: Secondary | ICD-10-CM

## 2019-12-26 DIAGNOSIS — M6281 Muscle weakness (generalized): Secondary | ICD-10-CM

## 2019-12-26 DIAGNOSIS — R208 Other disturbances of skin sensation: Secondary | ICD-10-CM

## 2019-12-26 DIAGNOSIS — R2689 Other abnormalities of gait and mobility: Secondary | ICD-10-CM

## 2019-12-26 NOTE — Therapy (Signed)
[facility] [address] [place], [place], [address] Phone: [phone]   Fax:  [phone]  Occupational Therapy Treatment  Patient Details  Name: Michael Bright MRN: [id number] Date of Birth: [birth year] Referring Provider (OT): Dr. Riley Kill   Encounter Date: 12/26/2019  OT End of Session - 12/26/19 1413    Visit Number  12    Number of Visits  25    Date for OT Re-Evaluation  01/24/20    Authorization Type  [employer]    Authorization - Visit Number  12    Authorization - Number of Visits  20    OT Start Time  1405    OT Stop Time  1443    OT Time Calculation (min)  38 min    Activity Tolerance  Patient tolerated treatment well    Behavior During Therapy  [facility] for tasks assessed/performed       Past Medical History:  Diagnosis Date  . Acute CVA (cerebrovascular accident) (HCC) 05/30/2019  . Acute kidney injury (HCC)   . Acute respiratory failure (HCC)   . Aspiration pneumonia (HCC) 06/14/2019  . 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 Diabetes (HCC)   . 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 (CVA) 08/09/2019  . Pneumonia    aspiration pneumonia  . Sepsis with acute renal failure without septic shock (HCC)   . Septic shock (HCC)   . Stroke (HCC)   . Urinary tract infection   . Urinary tract infection without hematuria     Past Surgical History:  Procedure Laterality Date  . CHOLECYSTECTOMY    . IR GASTROSTOMY TUBE MOD SED  07/04/2019    There were no vitals filed for this visit.  Subjective Assessment - 12/26/19 1406    Subjective   Pt reports he got his first dose of vaccine Saturday    Patient is accompanied by:  Family member   wife   Pertinent History  Michael Bright is a 64 y.o. 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 He was readmitted on 07/20/19 with septic shock due to Klebsiella pneumoniae bacteremia with UTI and hypoxic respiratory failure bilateral lung infiltrates due to rhino virus. He was intubated briefly from 10/1-10/2 and treated with broad spectrum antibiotics till cultures finalized. Antibiotics narrowed to cefdinir on 10/5 with recommendations to complete 2 weeks total antibiotic course.  Hospital course significant for AKI with abnormal LFTs due to shocked liver, intermittent hypoxic episode as well as significant oral secretions with dysphonia. Discharged home 08/08/19.    Limitations  NPO, feeding tube    Patient Stated Goals  improve coordination and walking    Currently in Pain?  No/denies             Treatment: Stacking coins with LUE then picking up stack in hand to place pennies in piggy bank 1 at a time, min-mod difficulty/ v.c to avoid compensation. Gripper level 3 to pick up 1 inch blocks with LUE for sustained grip, min difficulty/ drops.  Arm bike x 6 mins level 4 for conditioning            OT Education - 12/26/19 1438    Education Details  yellow theraband exercises 10-15 reps each, min vc. see pt instructions  Person(s) Educated  Patient    Methods  Explanation;Demonstration;Verbal cues;Handout    Comprehension  Verbalized understanding;Returned demonstration;Verbal cues required       OT Short Term Goals - 12/08/19 1412      OT SHORT TERM GOAL #1   Title  I with HEP.    Time  4    Period  Weeks    Status  Achieved    Target Date  11/25/19      OT SHORT TERM GOAL #2   Title  Pt will demonstrate improved fine motor coordination for ADLs as evidenced by decreasing 9 hole peg test score to 50 secs or less    Baseline  RUE 35.41, LUE 57.78    Time  4    Period  Weeks    Status  Achieved   47.31     OT SHORT TERM GOAL #3   Title  Pt  will demonstrate aiblity to retrievee light weight object at 110* shoulder flexion with LUE.    Time  4    Period  Weeks    Status  Achieved   >110     OT SHORT TERM GOAL #4   Title  Pt will perfrom light home management/ basic cooking with supervision.    Time  4    Period  Weeks    Status  On-going   not yet addressed, pt can't eat yet       OT Long Term Goals - 12/26/19 1409      OT LONG TERM GOAL #1   Title  I with updated HEP.- 01/24/20    Time  12    Period  Weeks    Status  New      OT LONG TERM GOAL #2   Title  Pt will demonstrate improved LUE fine motor coordination for ADLS as evidenced by decreasing 9 hole peg test score to 42 secs or less.    Time  12    Period  Weeks    Status  On-going   47.85     OT LONG TERM GOAL #3   Title  Pt will retrieve a lightweight object at 120 shoulder flexion with LUE.    Time  12    Period  Weeks    Status  New      OT LONG TERM GOAL #4   Title  Pt will perform mod complex cooking/ home management modified independently    Time  12    Period  Weeks    Status  On-going      OT LONG TERM GOAL #5   Title  Pt will perfrom all basic ADLS modified independently.    Time  12    Period  Weeks    Status  Achieved   per pt report for BADLS           Plan - 12/26/19 1434    Clinical Impression Statement  Pt is progressing towards goals with improving LUE strength and coordination.    Occupational performance deficits (Please refer to evaluation for details):  ADL's;IADL's;Leisure;Social Participation    Body Structure / Function / Physical Skills  ADL;UE functional use;Flexibility;Pain;FMC;ROM;Coordination;GMC;Sensation;Decreased knowledge of precautions;Decreased knowledge of use of DME;IADL;Dexterity;Strength;Mobility;Endurance;Balance;Vision    Rehab Potential  Good    OT Frequency  2x / week    OT Duration  12 weeks    OT Treatment/Interventions  Self-care/ADL training;Aquatic Therapy;DME and/or AE  instruction;Patient/family education;Balance training;Passive range of motion;Gait Training;Paraffin;Cryotherapy;Fluidtherapy;Splinting;Building services engineer;Contrast Bath;Electrical Stimulation;Moist  Heat;Therapeutic exercise;Manual Therapy;Therapeutic activities;Neuromuscular education    Plan  review theraband exercises and add to them prn, standing tolerance, continue LUE coordination    Consulted and Agree with Plan of Care  Patient       Patient will benefit from skilled therapeutic intervention in order to improve the following deficits and impairments:   Body Structure / Function / Physical Skills: ADL, UE functional use, Flexibility, Pain, FMC, ROM, Coordination, GMC, Sensation, Decreased knowledge of precautions, Decreased knowledge of use of DME, IADL, Dexterity, Strength, Mobility, Endurance, Balance, Vision       Visit Diagnosis: Other lack of coordination  Muscle weakness (generalized)  Other disturbances of skin sensation  Other abnormalities of gait and mobility  Unsteadiness on feet    Problem List Patient Active Problem List   Diagnosis Date Noted  . Lower extremity edema 11/24/2019  . Difficulty urinating 11/24/2019  . Dupuytren's contracture of left hand 09/20/2019  . Hyperlipidemia 08/24/2019  . Upper and lower extremity pain 08/24/2019  . OSA on CPAP 08/09/2019  . Copious oral secretions 08/09/2019  . Anemia of chronic disease   . Dysphagia due to recent cerebral infarction   . Diabetes mellitus type 2 in obese ([place])   . Morbid obesity ([place])   . Status post insertion of percutaneous endoscopic gastrostomy (PEG) tube ([place])   . History of CVA (cerebrovascular accident) 06/27/2019  . Essential hypertension 06/14/2019  . Renal insufficiency 05/30/2019  . Diplopia   . Chronic fatigue     Debbie Bellucci 12/26/2019, 2:39 PM  [facility] [address] [place], [place], [address] Phone:  [phone]   Fax:  [phone]  Name: Michael Bright MRN: [id number] Date of Birth: [birth year]

## 2019-12-26 NOTE — Patient Instructions (Signed)
       Resisted Horizontal Abduction: Bilateral   Sit or stand, tubing in both hands, arms out in front. Keeping arms straight, pinch shoulder blades together and stretch arms out. Repeat _10___ times per set. Do _1-2___ sessions per day, every other day.   Elbow Flexion: Resisted   With tubing held in left ______ hand(s) and other end secured under foot, curl arm up as far as possible. Repeat _10___ times per set. Do _1-2___ sessions per day, every other day.    Elbow Extension: Resisted   Sit in chair with resistive band secured at armrest (or hold with other hand) and __left_____ elbow bent. Straighten elbow. Repeat _10___ times per set.  Do _1-2___ sessions per day, every other day.   Copyright  [employer]. All rights reserved.

## 2019-12-27 ENCOUNTER — Ambulatory Visit: Payer: BC Managed Care – PPO

## 2019-12-28 ENCOUNTER — Ambulatory Visit: Payer: BC Managed Care – PPO

## 2019-12-28 ENCOUNTER — Ambulatory Visit: Payer: BC Managed Care – PPO | Admitting: Occupational Therapy

## 2019-12-28 ENCOUNTER — Ambulatory Visit: Payer: BC Managed Care – PPO | Admitting: Physical Therapy

## 2019-12-28 ENCOUNTER — Other Ambulatory Visit: Payer: Self-pay

## 2019-12-28 DIAGNOSIS — M6281 Muscle weakness (generalized): Secondary | ICD-10-CM

## 2019-12-28 DIAGNOSIS — R2689 Other abnormalities of gait and mobility: Secondary | ICD-10-CM

## 2019-12-28 DIAGNOSIS — R1312 Dysphagia, oropharyngeal phase: Secondary | ICD-10-CM | POA: Diagnosis not present

## 2019-12-28 DIAGNOSIS — R208 Other disturbances of skin sensation: Secondary | ICD-10-CM

## 2019-12-28 DIAGNOSIS — R2681 Unsteadiness on feet: Secondary | ICD-10-CM

## 2019-12-28 DIAGNOSIS — R278 Other lack of coordination: Secondary | ICD-10-CM

## 2019-12-28 NOTE — Therapy (Signed)
[facility] [address] [place], [place], [address] Phone: [phone]   Fax:  [phone]  Occupational Therapy Treatment  Patient Details  Name: Michael Bright MRN: [id number] Date of Birth: [birth year] Referring Provider (OT): Dr. Naaman Plummer   Encounter Date: 12/28/2019  OT End of Session - 12/28/19 1539    Visit Number  13    Number of Visits  25    Date for OT Re-Evaluation  01/24/20    Authorization Type  [place]    Authorization - Visit Number  13    Authorization - Number of Visits  20    OT Start Time  1962    OT Stop Time  1615    OT Time Calculation (min)  40 min    Activity Tolerance  Patient tolerated treatment well    Behavior During Therapy  [facility] for tasks assessed/performed       Past Medical History:  Diagnosis Date  . Acute CVA (cerebrovascular accident) ([place]) 05/30/2019  . Acute kidney injury ([place])   . Acute respiratory failure ([place])   . Aspiration pneumonia ([place]) 06/14/2019  . 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 Diabetes ([place])   . 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 (CVA) 08/09/2019  . Pneumonia    aspiration pneumonia  . Sepsis with acute renal failure without septic shock ([place])   . Septic shock ([place])   . Stroke ([place])   . Urinary tract infection   . Urinary tract infection without hematuria     Past Surgical History:  Procedure Laterality Date  . CHOLECYSTECTOMY    . IR GASTROSTOMY TUBE MOD SED  07/04/2019    There were no vitals filed for this visit.  Subjective Assessment - 12/28/19 1538    Subjective   deneis pain    Patient is accompanied by:  Family member   wife   Pertinent History  Michael Bright is a 64 y.o. 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 He was readmitted on 07/20/19 with septic shock due to Klebsiella pneumoniae bacteremia with UTI and hypoxic respiratory failure bilateral lung infiltrates due to rhino virus. He was intubated briefly from 10/1-10/2 and treated with broad spectrum antibiotics till cultures finalized. Antibiotics narrowed to cefdinir on 10/5 with recommendations to complete 2 weeks total antibiotic course.  Hospital course significant for AKI with abnormal LFTs due to shocked liver, intermittent hypoxic episode as well as significant oral secretions with dysphonia. Discharged home 08/08/19.    Limitations  NPO, feeding tube    Patient Stated Goals  improve coordination and walking    Currently in Pain?  No/denies           Treatment: Reviewed yellow theraband ex for shoulder abduction, biceps curls, triceps ext, then pt performed rowing in seated, min v.c 10 reps each, bilateral UE's. Cane ex seated for shoulder flexion x 15 reps min v.c for positioning. Arm bike x 8 mins level 4 for conditioning. Standing to copy small peg design for increased fine motor coordination with LUE, min-mod difficulty/ drops. Gripper set at level 3 for sustained grip LUE.                   OT Short Term Goals - 12/08/19 1412  OT SHORT TERM GOAL #1   Title  I with HEP.    Time  4    Period  Weeks    Status  Achieved    Target Date  11/25/19      OT SHORT TERM GOAL #2   Title  Pt will demonstrate improved fine motor coordination for ADLs as evidenced by decreasing 9 hole peg test score to 50 secs or less    Baseline  RUE 35.41, LUE 57.78    Time  4    Period  Weeks    Status  Achieved   47.31     OT SHORT TERM GOAL #3   Title  Pt will demonstrate aiblity to retrievee light weight object at 110* shoulder flexion with LUE.    Time  4    Period  Weeks    Status  Achieved   >110     OT SHORT TERM GOAL #4   Title  Pt will  perfrom light home management/ basic cooking with supervision.    Time  4    Period  Weeks    Status  On-going   not yet addressed, pt can't eat yet       OT Long Term Goals - 12/26/19 1409      OT LONG TERM GOAL #1   Title  I with updated HEP.- 01/24/20    Time  12    Period  Weeks    Status  New      OT LONG TERM GOAL #2   Title  Pt will demonstrate improved LUE fine motor coordination for ADLS as evidenced by decreasing 9 hole peg test score to 42 secs or less.    Time  12    Period  Weeks    Status  On-going   47.85     OT LONG TERM GOAL #3   Title  Pt will retrieve a lightweight object at 120 shoulder flexion with LUE.    Time  12    Period  Weeks    Status  New      OT LONG TERM GOAL #4   Title  Pt will perform mod complex cooking/ home management modified independently    Time  12    Period  Weeks    Status  On-going      OT LONG TERM GOAL #5   Title  Pt will perfrom all basic ADLS modified independently.    Time  12    Period  Weeks    Status  Achieved   per pt report for BADLS           Plan - 12/28/19 1539    Clinical Impression Statement  Pt. demonstrates good overall progress towards goals.    Occupational performance deficits (Please refer to evaluation for details):  ADL's;IADL's;Leisure;Social Participation    Body Structure / Function / Physical Skills  ADL;UE functional use;Flexibility;Pain;FMC;ROM;Coordination;GMC;Sensation;Decreased knowledge of precautions;Decreased knowledge of use of DME;IADL;Dexterity;Strength;Mobility;Endurance;Balance;Vision    Rehab Potential  Good    OT Frequency  2x / week    OT Duration  12 weeks    OT Treatment/Interventions  Self-care/ADL training;Aquatic Therapy;DME and/or AE instruction;Patient/family education;Balance training;Passive range of motion;Gait Training;Paraffin;Cryotherapy;Fluidtherapy;Splinting;Building services engineer;Contrast Bath;Electrical Stimulation;Moist Heat;Therapeutic exercise;Manual  Therapy;Therapeutic activities;Neuromuscular education    Plan  standing tolerance, continue LUE coordination    Consulted and Agree with Plan of Care  Patient       Patient will benefit from skilled therapeutic intervention in order to  improve the following deficits and impairments:   Body Structure / Function / Physical Skills: ADL, UE functional use, Flexibility, Pain, FMC, ROM, Coordination, GMC, Sensation, Decreased knowledge of precautions, Decreased knowledge of use of DME, IADL, Dexterity, Strength, Mobility, Endurance, Balance, Vision       Visit Diagnosis: Other lack of coordination  Muscle weakness (generalized)  Other disturbances of skin sensation    Problem List Patient Active Problem List   Diagnosis Date Noted  . Lower extremity edema 11/24/2019  . Difficulty urinating 11/24/2019  . Dupuytren's contracture of left hand 09/20/2019  . Hyperlipidemia 08/24/2019  . Upper and lower extremity pain 08/24/2019  . OSA on CPAP 08/09/2019  . Copious oral secretions 08/09/2019  . Anemia of chronic disease   . Dysphagia due to recent cerebral infarction   . Diabetes mellitus type 2 in obese (HCC)   . Morbid obesity (HCC)   . Status post insertion of percutaneous endoscopic gastrostomy (PEG) tube (HCC)   . History of CVA (cerebrovascular accident) 06/27/2019  . Essential hypertension 06/14/2019  . Renal insufficiency 05/30/2019  . Diplopia   . Chronic fatigue     Naylani Bradner 12/28/2019, 3:56 PM  [facility] [address] [place], [place], [address] Phone: [phone]   Fax:  [phone]  Name: Michael Bright MRN: [id number] Date of Birth: August 26, 1956

## 2019-12-28 NOTE — Therapy (Signed)
[facility] [address] [place], [place], [address] Phone: [phone]   Fax:  [phone]  Physical Therapy Treatment  Patient Details  Name: Michael Bright MRN: [id number] Date of Birth: 1956/10/04 Referring Provider (PT): Faith Rogue, MD   Encounter Date: 12/28/2019  PT End of Session - 12/28/19 2121    Visit Number  12    Number of Visits  25    Date for PT Re-Evaluation  01/29/20    Authorization Type  BCBS - 2021 primary, Medicare Secondary    PT Start Time  1402    PT Stop Time  1444    PT Time Calculation (min)  42 min    Equipment Utilized During Treatment  Gait belt    Activity Tolerance  Patient tolerated treatment well    Behavior During Therapy  [facility] for tasks assessed/performed       Past Medical History:  Diagnosis Date  . Acute CVA (cerebrovascular accident) (HCC) 05/30/2019  . Acute kidney injury (HCC)   . Acute respiratory failure (HCC)   . Aspiration pneumonia (HCC) 06/14/2019  . 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 Diabetes (HCC)   . 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 (CVA) 08/09/2019  . Pneumonia    aspiration pneumonia  . Sepsis with acute renal failure without septic shock (HCC)   . Septic shock (HCC)   . Stroke (HCC)   . Urinary tract infection   . Urinary tract infection without hematuria     Past Surgical History:  Procedure Laterality Date  . CHOLECYSTECTOMY    . IR GASTROSTOMY TUBE MOD SED  07/04/2019    There were no vitals filed for this visit.  Subjective Assessment - 12/28/19 1405    Subjective  Feel a little more jumpy today.  Have to slow myself down with walking.    Pertinent History  PMH: CVA (L lateral medullary syndrome) diabetes, HTN, OSA, morbid obesity.    Limitations  Standing;Walking;House hold activities    How long can you stand comfortably?  can stand approx. 10 minutes    How long can you walk comfortably?  20 minutes with  the rollator    Patient Stated Goals  wants to walk better, walk more steady, wants to be able to get up more easily.    Currently in Pain?  No/denies    Pain Onset  More than a month ago    Pain Onset  More than a month ago                       [facility] Adult PT Treatment/Exercise - 12/28/19 0001      Ambulation/Gait   Ambulation/Gait  Yes    Ambulation/Gait Assistance  5: Supervision    Ambulation/Gait Assistance Details  Cues for RLE foot clearance, for increased step length, staying closer to rollator, for improved heelstrike    Ambulation Distance (Feet)  315 Feet   80; 40 ft x 2   Assistive device  Rollator    Gait Pattern  Step-through pattern;Decreased dorsiflexion - left;Decreased stance time - left;Decreased step length - right    Ambulation Surface  Level;Indoor      High Level Balance   High Level Balance Activities  Backward walking;Other (comment)    High Level Balance Comments  Forwards/back walking at counter, RUE support at counter, LUE support HHA of therapist, 3 reps.  Cues for foot clearance, step length.  Knee/Hip Exercises: Stretches   Active Hamstring Stretch  Right;Left;3 reps;30 seconds   3rd rep propped on 4" block   Gastroc Stretch  Right;Left;2 reps;30 seconds   using gait belt, seated   Other Knee/Hip Stretches  Attempted standing at counter, rocking forward/back for hamstring stretch-pt not getting correct technique with multiple trials.  Standing runner's stretch for gastrocs, 2 reps x 15 seconds each at counter.      Knee/Hip Exercises: Standing   Forward Step Up  Left;Both;2 sets;10 reps;Hand Hold: 2;Step Height: 6"    Functional Squat  2 sets;10 reps   Standing at locked rollator, mat behind patient   Other Standing Knee Exercises  SLS with foot propped on 6" step: alternating UE lifts x10, head turns x 5 reps    Other Standing Knee Exercises  Sidestep together, R<>L 2 sets x 10 reps at counter.      Knee/Hip Exercises: Seated    Sit to Sand  1 set;with UE support;10 reps   hands on knees; from 20" mat, cues for initial momentum              PT Short Term Goals - 12/14/19 2127      PT SHORT TERM GOAL #1   Title  Patient will be independent with initial HEP in order to build upon functional gains made in therapy.  ALL STGS DUE 01/11/20    Baseline  pt will benefit from on-going additions to HEP    Time  4    Period  Weeks    Status  On-going    Target Date  01/11/20      PT SHORT TERM GOAL #2   Title  Patient to improve BERG balance test to at least 38/56 for decreased fall risk.    Baseline  35/56 on 12/13/19    Time  4    Period  Weeks    Status  Revised      PT SHORT TERM GOAL #3   Title  Patient will decrease TUG time with bariatric rollator to 24 seconds of less to demo decr fall risk.    Baseline  30.22 seconds on 10/31/19; 25.93 sec 12/11/2019    Time  4    Period  Weeks    Status  Revised      PT SHORT TERM GOAL #4   Title  Patient will improve gait velocity with bariatric rollator to at least 2.0 ft/sec to demo a limited community ambulator and decr fall risk.    Baseline  1.1 ft/sec with L AFO and bariatric rollator; 1.6 ft/sec 12/11/2019    Time  4    Period  Weeks    Status  Revised      PT SHORT TERM GOAL #5   Title  Patient will decrease 5x sit <> stand time to 25 seconds of less from elevated mat table with UE support in order to demo improved functional LE strength.    Baseline  31.06 seconds; 27.19 sec 12/11/2019    Time  4    Period  Weeks    Status  On-going        PT Long Term Goals - 11/20/19 1711      PT LONG TERM GOAL #1   Title  Patient will be independent with final HEP in order to build upon functional gains made in therapy. ALL LTGS DUE 01/29/20    Time  13   due to delay in scheduling   Period  Weeks  Status  New      PT LONG TERM GOAL #2   Title  Berg score to imporve to at least 40/56 for decreased fall risk.    Time  13    Period  Weeks    Status   Revised      PT LONG TERM GOAL #3   Title  Patient will decrease 5x sit <> stand time to 19 seconds of less from elevated mat table vs. standard chair with UE support in order to demo improved functional LE strength.    Time  13    Period  Weeks    Status  New      PT LONG TERM GOAL #4   Title  Patient will improve gait velocity with bariatric rollator vs. LRAD to at least 2.6 ft/sec to demo improved community mobility.    Time  13    Period  Weeks    Status  New      PT LONG TERM GOAL #5   Title  Patient will decrease TUG time with bariatric rollator vs. LRAD to 19 seconds of less to demo decr fall risk.    Time  13    Period  Weeks    Status  New      PT LONG TERM GOAL #6   Title  Patient will ambulate at least 300' with supervision over level indoor and outdoor surfaces with LRAD  with supervision in order to improve community mobility.    Time  13    Period  Weeks    Status  New            Plan - 12/28/19 2123    Clinical Impression Statement  Skilled PT session today focused on lower extremity stretching and strengthening as well as standing balance and gait activities.  With standing balance and forward/back walking at counter, pt is able to lessen support to 1 UE (with therapist HHA just in case, but very lightly).  He continues to report (and noted today in SLS activity) that LLE bounces at times with standing and gait.  Pt will continue to benefit from skilled PT to further address strength, balance, and gait for improved overall funcitonal mobility and independence.    Personal Factors and Comorbidities  Comorbidity 3+;Past/Current Experience;Time since onset of injury/illness/exacerbation    Comorbidities  diabetes, HTN, OSA, morbid obesity.    Examination-Activity Limitations  Squat;Transfers;Locomotion Level;Stairs    Examination-Participation Restrictions  Community Activity    Stability/Clinical Decision Making  Evolving/Moderate complexity    Rehab Potential   Good    PT Frequency  2x / week    PT Duration  12 weeks    PT Treatment/Interventions  ADLs/Self Care Home Management;Balance training;Therapeutic exercise;Therapeutic activities;Functional mobility training;Stair training;Gait training;DME Instruction;Aquatic Therapy;Neuromuscular re-education;Patient/family education;Orthotic Fit/Training;Energy conservation    PT Next Visit Plan  *Pt appears to only be scheduled out for PT until wk of 3/25-need to look at POC/additional appointments.  Lower extremity stretching-consider adding to HEP; Continue strength, balance strategies, compliant surfaces, parallel bars to decrease support; use of aerobic machines for additional BLE strength    PT Home Exercise Plan  [id number] ([employer])    Consulted and Agree with Plan of Care  Patient       Patient will benefit from skilled therapeutic intervention in order to improve the following deficits and impairments:  Abnormal gait, Decreased activity tolerance, Decreased balance, Decreased coordination, Decreased range of motion, Decreased knowledge of use of DME, Decreased knowledge of  precautions, Decreased endurance, Decreased strength, Difficulty walking, Postural dysfunction, Impaired sensation, Obesity, Pain  Visit Diagnosis: Muscle weakness (generalized)  Unsteadiness on feet  Other abnormalities of gait and mobility     Problem List Patient Active Problem List   Diagnosis Date Noted  . Lower extremity edema 11/24/2019  . Difficulty urinating 11/24/2019  . Dupuytren's contracture of left hand 09/20/2019  . Hyperlipidemia 08/24/2019  . Upper and lower extremity pain 08/24/2019  . OSA on CPAP 08/09/2019  . Copious oral secretions 08/09/2019  . Anemia of chronic disease   . Dysphagia due to recent cerebral infarction   . Diabetes mellitus type 2 in obese ([place])   . Morbid obesity ([place])   . Status post insertion of percutaneous endoscopic gastrostomy (PEG) tube ([place])   . History of CVA  (cerebrovascular accident) 06/27/2019  . Essential hypertension 06/14/2019  . Renal insufficiency 05/30/2019  . Diplopia   . Chronic fatigue     Jaidynn Balster W. 12/28/2019, 9:27 PM  Frazier Butt., PT   [facility] [address] [place], [place], [address] Phone: [phone]   Fax:  [phone]  Name: Dianna Deshler MRN: [id number] Date of Birth: 01/09/1956

## 2019-12-29 ENCOUNTER — Ambulatory Visit: Payer: BC Managed Care – PPO

## 2019-12-29 DIAGNOSIS — R1312 Dysphagia, oropharyngeal phase: Secondary | ICD-10-CM | POA: Diagnosis not present

## 2019-12-29 NOTE — Therapy (Signed)
[facility] [address] [place], [place], [address] Phone: [phone]   Fax:  [phone]  Speech Language Pathology Treatment  Patient Details  Name: Michael Bright MRN: [id number] Date of Birth: 1956-02-18 Referring Provider (SLP): Dr. Naaman Plummer   Encounter Date: 12/28/2019  End of Session - 12/29/19 0910    Visit Number  14    Number of Visits  25    Date for SLP Re-Evaluation  02/16/20    SLP Start Time  1450    SLP Stop Time   1530    SLP Time Calculation (min)  40 min    Activity Tolerance  Patient tolerated treatment well       Past Medical History:  Diagnosis Date  . Acute CVA (cerebrovascular accident) ([place]) 05/30/2019  . Acute kidney injury ([place])   . Acute respiratory failure ([place])   . Aspiration pneumonia ([place]) 06/14/2019  . 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 Diabetes ([place])   . 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 (CVA) 08/09/2019  . Pneumonia    aspiration pneumonia  . Sepsis with acute renal failure without septic shock ([place])   . Septic shock ([place])   . Stroke ([place])   . Urinary tract infection   . Urinary tract infection without hematuria     Past Surgical History:  Procedure Laterality Date  . CHOLECYSTECTOMY    . IR GASTROSTOMY TUBE MOD SED  07/04/2019    There were no vitals filed for this visit.  Subjective Assessment - 12/28/19 1520    Subjective  Banana shake today - again, near nectar consistency.    Currently in Pain?  No/denies            ADULT SLP TREATMENT - 12/29/19 0001      General Information   Behavior/Cognition  Alert;Pleasant mood;Cooperative      Treatment Provided   Treatment provided  Dysphagia      Cognitive-Linquistic Treatment   Skilled Treatment  SLP used NMES with pt today with placemnet 3a beginning at 16.0 mA but incr'd to 17.0 mA by end of session. Pt again denies any overt s/sx aspiration PNA since last session. Pt  reports supine/reclined swallowing after thorough oral care each day since last session.  Today pt again reports little pharyngeal residue after the second swallow, and at times reports no residue felt in pharynx after the first swallow. Pt without coughing today - he asked SLP if he could do tomato soup at home and SLP told him he could, with small sips, and at lukewarm or room temperature. HEP completed with initial min A for full breath with EMST.      Assessment / Recommendations / Plan   Plan  Continue with current plan of care         SLP Short Term Goals - 12/15/19 1510      SLP SHORT TERM GOAL #1   Title  Pt will complete dysphagia HEP with rare min A x 3 sessions.    Status  Partially Met      SLP SHORT TERM GOAL #2   Title  Patient will tolerate electrode placement for NMES x 3 sessions.    Baseline  12-05-19, 12-11-19    Status  Achieved      SLP SHORT TERM GOAL #3   Title  Patient will tell SLP 3 s/sx of aspiration PNA.    Status  Achieved       SLP Long Term Goals -  12/29/19 0912      SLP LONG TERM GOAL #1   Title  Pt will demonstrate dysphagia HEP with modified independence x3 sessions.    Time  3    Period  Weeks    Status  On-going      SLP LONG TERM GOAL #2   Title  Pt will report thorough oral care prior to water/ice chips x6 sessions.    Baseline  11-29-19, 12-15-19 12-20-19, 12-28-19    Time  3    Period  Weeks    Status  On-going      SLP LONG TERM GOAL #3   Title  pt will tolerate electrode placement for 7 therapy sessions    Baseline  12-11-19, 12-15-19, 12-19-19, 12-20-19, 12-28-19,    Time  3    Period  Weeks    Status  On-going      SLP LONG TERM GOAL #4   Title  pt will swallow within 2 seconds of bolus presentation to oral cavity, or in subsequent swallows at least 80% with at least 60 swallows    Time  3    Period  Weeks    Status  On-going       Plan - 12/29/19 0911    Clinical Impression Statement  Pt enters ST room without hydrophonia  present.  He continues PEG dependent since hospitalization. See "skilled treatment" for  more details. He cont to perform nectar trials at home and also reports taking sips of regular water in supine after oral care at home. Patient has been completing pharyngeal exercises and RMT (expiratory) with improvement in function noted on previous MBS (10-18-19).  I recommend cont'd skilled ST to address dysphagia for training in aspiration precautions, pharyngeal exercises, and  NMES to improve pt's swallow function for possible advancement to comfort feeds. Advise nectar thick liquid trials and (per MBSS) thin  liquids in supine as much as pt is comfortable.    Speech Therapy Frequency  3x / week   or 25 total visits   Duration  --   8 weeks or 17 visits   Treatment/Interventions  Aspiration precaution training;Cueing hierarchy;SLP instruction and feedback;Pharyngeal strengthening exercises;NMES;Compensatory techniques;Diet toleration management by SLP;Trials of upgraded texture/liquids;Patient/family education    Potential to Achieve Goals  Good    Potential Considerations  Severity of impairments    SLP Home Exercise Plan  Continue swallow ex and RMT per [facility] SLP; bring exercises to next session    Consulted and Agree with Plan of Care  Patient;Family member/caregiver       Patient will benefit from skilled therapeutic intervention in order to improve the following deficits and impairments:   Oropharyngeal dysphagia    Problem List Patient Active Problem List   Diagnosis Date Noted  . Lower extremity edema 11/24/2019  . Difficulty urinating 11/24/2019  . Dupuytren's contracture of left hand 09/20/2019  . Hyperlipidemia 08/24/2019  . Upper and lower extremity pain 08/24/2019  . OSA on CPAP 08/09/2019  . Copious oral secretions 08/09/2019  . Anemia of chronic disease   . Dysphagia due to recent cerebral infarction   . Diabetes mellitus type 2 in obese ([place])   . Morbid obesity ([place])   . Status  post insertion of percutaneous endoscopic gastrostomy (PEG) tube ([place])   . History of CVA (cerebrovascular accident) 06/27/2019  . Essential hypertension 06/14/2019  . Renal insufficiency 05/30/2019  . Diplopia   . Chronic fatigue     Jaskaran Dauzat ,MS, CCC-SLP  12/29/2019, 9:19  AM  [facility] [address] [place], [place], [address] Phone: [phone]   Fax:  [phone]   Name: Michael Bright MRN: [id number] Date of Birth: 1956-07-15

## 2019-12-29 NOTE — Therapy (Signed)
[facility] [address] [place], [place], [address] Phone: [phone]   Fax:  [phone]  Speech Language Pathology Treatment  Patient Details  Name: Michael Bright MRN: [id number] Date of Birth: 31-Aug-1956 Referring Provider (SLP): Dr. Naaman Plummer   Encounter Date: 12/29/2019  End of Session - 12/29/19 1617    Visit Number  15    Number of Visits  25    Date for SLP Re-Evaluation  02/16/20    Authorization Type  BCBS Primary, Medicare secondary    SLP Start Time  1532    SLP Stop Time   1600    SLP Time Calculation (min)  28 min    Activity Tolerance  Patient tolerated treatment well       Past Medical History:  Diagnosis Date  . Acute CVA (cerebrovascular accident) ([place]) 05/30/2019  . Acute kidney injury ([place])   . Acute respiratory failure ([place])   . Aspiration pneumonia ([place]) 06/14/2019  . 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 Diabetes ([place])   . 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 (CVA) 08/09/2019  . Pneumonia    aspiration pneumonia  . Sepsis with acute renal failure without septic shock ([place])   . Septic shock ([place])   . Stroke ([place])   . Urinary tract infection   . Urinary tract infection without hematuria     Past Surgical History:  Procedure Laterality Date  . CHOLECYSTECTOMY    . IR GASTROSTOMY TUBE MOD SED  07/04/2019    There were no vitals filed for this visit.  Subjective Assessment - 12/29/19 1613    Subjective  Arrives with CTAR basketball as well asl EMST to perform HEP for ST to assess.    Currently in Pain?  No/denies            ADULT SLP TREATMENT - 12/29/19 1613      General Information   Behavior/Cognition  Alert;Pleasant mood;Cooperative      Treatment Provided   Treatment provided  Dysphagia      Cognitive-Linquistic Treatment   Skilled Treatment  Pt performed HEP for SLP with SLP requiring to provide min A occasionally with jaw opening against  resistance. Pt req'd one cue to not more shoulders with EMST.  SLP offered to take pt to gym and work with thin liquids in supine but pt c./o significnat acid reflux  and delcined. SLP told pt to contact his PCP - he was going to ask JEssica , NP for [facility] if she oculd prescribe somethiong however SLP told pt it might be best to call his PCP for anti-reflux meds.       Assessment / Recommendations / Plan   Plan  Continue with current plan of care      Progression Toward Goals   Progression toward goals  Progressing toward goals       SLP Education - 12/29/19 1616    Education Details  HEP procedure (jaw opening)    Person(s) Educated  Patient    Methods  Explanation;Demonstration    Comprehension  Verbalized understanding;Returned demonstration       SLP Short Term Goals - 12/15/19 1510      SLP SHORT TERM GOAL #1   Title  Pt will complete dysphagia HEP with rare min A x 3 sessions.    Status  Partially Met      SLP SHORT TERM GOAL #2   Title  Patient will tolerate electrode placement for NMES x 3 sessions.  Baseline  12-05-19, 12-11-19    Status  Achieved      SLP SHORT TERM GOAL #3   Title  Patient will tell SLP 3 s/sx of aspiration PNA.    Status  Achieved       SLP Long Term Goals - 12/29/19 1618      SLP LONG TERM GOAL #1   Title  Pt will demonstrate dysphagia HEP with modified independence x3 sessions.    Time  3    Period  Weeks    Status  On-going      SLP LONG TERM GOAL #2   Title  Pt will report thorough oral care prior to water/ice chips x6 sessions.    Baseline  11-29-19, 12-15-19 12-20-19, 12-28-19    Time  3    Period  Weeks    Status  On-going      SLP LONG TERM GOAL #3   Title  pt will tolerate electrode placement for 7 therapy sessions    Baseline  12-11-19, 12-15-19, 12-19-19, 12-20-19, 12-28-19,    Time  3    Period  Weeks    Status  On-going      SLP LONG TERM GOAL #4   Title  pt will swallow within 2 seconds of bolus presentation to oral cavity, or  in subsequent swallows at least 80% with at least 60 swallows    Time  3    Period  Weeks    Status  On-going       Plan - 12/29/19 1617    Clinical Impression Statement  Pt enters ST room without hydrophonia present.  He continues PEG dependent since hospitalization. See "skilled treatment" for  more details. He cont to perform nectar trials at home. Reports taking sips of regular water in supine after oral care at home is on hold due to significant GERD feelings. Patient has been completing pharyngeal exercises and RMT (expiratory) with improvement in function noted on previous MBS (10-18-19).  I recommend cont'd skilled ST to address dysphagia for training in aspiration precautions, pharyngeal exercises, and  NMES to improve pt's swallow function for possible advancement to comfort feeds. Advise nectar thick liquid trials and (per MBSS) thin  liquids in supine as much as pt is comfortable.    Speech Therapy Frequency  3x / week   or 25 total visits   Duration  --   8 weeks or 17 visits   Treatment/Interventions  Aspiration precaution training;Cueing hierarchy;SLP instruction and feedback;Pharyngeal strengthening exercises;NMES;Compensatory techniques;Diet toleration management by SLP;Trials of upgraded texture/liquids;Patient/family education    Potential to Achieve Goals  Good    Potential Considerations  Severity of impairments    SLP Home Exercise Plan  Continue swallow ex and RMT per [facility] SLP; bring exercises to next session    Consulted and Agree with Plan of Care  Patient;Family member/caregiver       Patient will benefit from skilled therapeutic intervention in order to improve the following deficits and impairments:   Oropharyngeal dysphagia    Problem List Patient Active Problem List   Diagnosis Date Noted  . Lower extremity edema 11/24/2019  . Difficulty urinating 11/24/2019  . Dupuytren's contracture of left hand 09/20/2019  . Hyperlipidemia 08/24/2019  . Upper and lower  extremity pain 08/24/2019  . OSA on CPAP 08/09/2019  . Copious oral secretions 08/09/2019  . Anemia of chronic disease   . Dysphagia due to recent cerebral infarction   . Diabetes mellitus type 2 in obese ([place])   .  Morbid obesity ([place])   . Status post insertion of percutaneous endoscopic gastrostomy (PEG) tube ([place])   . History of CVA (cerebrovascular accident) 06/27/2019  . Essential hypertension 06/14/2019  . Renal insufficiency 05/30/2019  . Diplopia   . Chronic fatigue     Geryl Dohn ,MS, CCC-SLP  12/29/2019, 4:19 PM  [facility] [address] [place], [place], [address] Phone: [phone]   Fax:  [phone]   Name: Kash Davie MRN: [id number] Date of Birth: [birth year]

## 2020-01-01 ENCOUNTER — Ambulatory Visit (INDEPENDENT_AMBULATORY_CARE_PROVIDER_SITE_OTHER): Payer: BC Managed Care – PPO | Admitting: Adult Health

## 2020-01-01 ENCOUNTER — Other Ambulatory Visit: Payer: Self-pay

## 2020-01-01 ENCOUNTER — Other Ambulatory Visit: Payer: Self-pay | Admitting: Primary Care

## 2020-01-01 ENCOUNTER — Encounter: Payer: Self-pay | Admitting: Adult Health

## 2020-01-01 VITALS — BP 136/76 | HR 106 | Ht 72.0 in | Wt 306.0 lb

## 2020-01-01 DIAGNOSIS — I10 Essential (primary) hypertension: Secondary | ICD-10-CM | POA: Diagnosis not present

## 2020-01-01 DIAGNOSIS — G464 Cerebellar stroke syndrome: Secondary | ICD-10-CM | POA: Diagnosis not present

## 2020-01-01 DIAGNOSIS — E785 Hyperlipidemia, unspecified: Secondary | ICD-10-CM | POA: Diagnosis not present

## 2020-01-01 DIAGNOSIS — K5900 Constipation, unspecified: Secondary | ICD-10-CM

## 2020-01-01 DIAGNOSIS — E1149 Type 2 diabetes mellitus with other diabetic neurological complication: Secondary | ICD-10-CM | POA: Diagnosis not present

## 2020-01-01 DIAGNOSIS — I69391 Dysphagia following cerebral infarction: Secondary | ICD-10-CM

## 2020-01-01 DIAGNOSIS — Z794 Long term (current) use of insulin: Secondary | ICD-10-CM

## 2020-01-01 DIAGNOSIS — K219 Gastro-esophageal reflux disease without esophagitis: Secondary | ICD-10-CM

## 2020-01-01 MED ORDER — PANTOPRAZOLE SODIUM 40 MG PO PACK: 40.0000 mg | PACK | Freq: Every day | ORAL | 1 refills | Status: DC

## 2020-01-01 NOTE — Patient Instructions (Signed)
Continue aspirin 81 mg daily  and atorvastatin for secondary stroke prevention  Start protonix 40mg  daily - after 6-8 weeks, if no benefit please follow up with PCP  Possibly consider increasing gabapentin due to ongoing right arm pain  Continue to follow up with PCP regarding cholesterol, blood pressure and diabetes management   Continue to work with outpatient therapies for ongoing hopeful improvement of stroke deficits  Continue to monitor blood pressure at home  Maintain strict control of hypertension with blood pressure goal below 130/90, diabetes with hemoglobin A1c goal below 6.5% and cholesterol with LDL cholesterol (bad cholesterol) goal below 70 mg/dL. I also advised the patient to eat a healthy diet with plenty of whole grains, cereals, fruits and vegetables, exercise regularly and maintain ideal body weight.  Followup in the future with me in 3 months or call earlier if needed       Thank you for coming to see at [facility]. I hope we have been able to provide you high quality care today.  You may receive a patient satisfaction survey over the next few weeks. We would appreciate your feedback and comments so that we may continue to improve ourselves and the health of our patients.

## 2020-01-01 NOTE — Progress Notes (Signed)
[facility] [address] [place]. [place] [id number] [phone]       STROKE FOLLOW UP NOTE  [name] Date of Birth:  [birth year] Medical Record Number:  [id number]   Reason for Referral: stroke follow up    CHIEF COMPLAINT:  Chief Complaint  Patient presents with  . Follow-up    RM 9, with wife. Doing well.    HPI:   [name] is a 64 year old male who is being seen today, 01/01/2020, for stroke follow-up accompanied by his wife.  He has been stable from a stroke standpoint with residual deficits of right hemiparesis, RUE paresthesias and dysphagia s/p PEG tube. Continues to work with outpatient therapy with ongoing improvement.  Continues to use Rollator walker at all times and denies any recent falls.  Ongoing use of PEG tube but has been advanced to NTL without difficulty.  He does endorse increased acid reflux symptoms over the past month which is also required increased suctioning.  He does report use of tomato juice during therapy.  Continues cyclic tube feeding and recently evaluated nutrition who changed tube feed formulation due to constipation and weight gain -has not yet started new feeding.  Continues on gabapentin 250 mg nightly for RUE pain but endorses ongoing constant warm sensation.  Completed 3 months DAPT and continues on aspirin alone without bleeding or bruising.  Continues on atorvastatin 40 mg daily without myalgias.  Recent A1c 5.7.  Continues to follow PCP for HTN, HLD and DM management.  Blood pressure today 136/76. No further concerns at this time.    History copied for reference purposes only Stroke admission 05/30/2019: [name] is a 64 y.o. male with hx DB who stopped meds 10 yrs ago and morbid obesity [facility] with dysarthria, left facial droop, double vision and gait unsteadiness since Sunday 8/9.  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   CTA head/neck negative LVO with diffuse intracranial stenosis moderate supraclinoid bilateral ICAs and mild stenosis left VA origin.  2D echo normal EF without cardiac source of embolus identified.  LDL 135.  A1c 10.6.  Recommended DAPT for 3 months then aspirin alone.  Blood pressure stable during admission without prior history of HTN.  Initiated atorvastatin 40 mg daily.  New diagnosis of DM and recommended close PCP follow-up.  Other stroke is factors include morbid obesity and EtOH but no prior history of stroke.  Hospital course complicated by hypoxemic respiratory failure secondary to medullary stroke intubated on 8/15 with trach placement on 8/21, possible aspiration pneumonia secondary to medullary stroke and dysphagia secondary to medullary stroke with placement of PEG tube on 9/15.  He was discharged to CIR on 06/27/2019 for ongoing therapy with residual deficits.  During admission to CIR, he had improvement of respiratory status and was decannulated on 9/22.  Difficulty with neurogenic bladder requiring intermittent caths and discharged with recommendation on following up with urology outpatient.  He was discharged home in stable condition with recommendation of home health therapy on 07/17/2019.  He unfortunately was readmitted on 07/20/2019 with septic shock due to Klebsiella pneumoniae bacteremia with UTI and hypoxic respiratory failure bilateral lung infiltrates due to rhinovirus.  Required intubation briefly and treated with broad-spectrum antibiotics eventually narrowed with recommendation of 2-week total antibiotic course.  Hospital course significant for AKI with abnormal LFTs due to shock liver, intermittent hypoxic episode as well as significant oral secretions with dysphonia.  He was discharged  back to CIR for ongoing therapy due to debility.  Ongoing deficits of severe pharyngeal dysphagia and mild memory deficits affecting recall but showed improvement of activity tolerance, balance and postural  control as well as ability to compensate for deficits and was discharged home with home health therapy on 08/08/2019.  Initial visit 09/11/2019: [name] is a 64 year old male who is being seen today for hospital follow-up accompanied by his wife.  He has been doing well since discharge with residual right-sided weakness, imbalance and dysphagia.  He does endorse ongoing improvement with home health therapies and plans on transitioning to outpatient therapies once completed.  He does endorse occasional sharp stabbing sensation with flexion of his left wrist and will resolve with extension.  Denies radiating pain in the wrist or fingers.  He will also experience intermittent shock type sensations on right upper and lower extremity.  He was started on gabapentin 250 mg at bedtime with little improvement.  He continues to use a rolling walker for ambulation as well as AFO brace and denies any recent falls.  Remains n.p.o. and receives all nutrition, hydration and medications through PEG tube.  He does endorse ongoing difficulty with excretions and does have suction device at home for assistance.  Improvement of neurogenic bladder and has been voiding independently.  PCP recently discontinued citalopram as he did not have residual depression/anxiety symptoms.  Depression/anxiety has been stable at this time.  He has continued on aspirin and Plavix without bleeding or bruising.  Continues on atorvastatin without myalgias.  Recent lipid panel obtained by PCP with improvement of LDL 55.  Blood pressure today satisfactory at 125/95.  Glucose levels greatly improved with recent A1c 7.3.  No further concerns at this time.    ROS:   14 system review of systems performed and negative with exception of weakness, acid reflux, pain and dysphagia  PMH:  Past Medical History:  Diagnosis Date  . Acute CVA (cerebrovascular accident) (HCC) 05/30/2019  . Acute kidney injury (HCC)   . Acute respiratory failure (HCC)   .  Aspiration pneumonia (HCC) 06/14/2019  . 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 Diabetes (HCC)   . 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 (CVA) 08/09/2019  . Pneumonia    aspiration pneumonia  . Sepsis with acute renal failure without septic shock (HCC)   . Septic shock (HCC)   . Stroke (HCC)   . Urinary tract infection   . Urinary tract infection without hematuria     PSH:  Past Surgical History:  Procedure Laterality Date  . CHOLECYSTECTOMY    . IR GASTROSTOMY TUBE MOD SED  07/04/2019    Social History:  Social History   Socioeconomic History  . Marital status: Married    Spouse name: Not on file  . Number of children: Not on file  . Years of education: Not on file  . Highest education level: Not on file  Occupational History  . Not on file  Tobacco Use  . Smoking status: Never Smoker  . Smokeless tobacco: Never Used  Substance and Sexual Activity  . Alcohol use: Yes  . Drug use: No  . Sexual activity: Yes  Other Topics Concern  . Not on file  Social History Narrative  . Not on file   Social Determinants of Health   Financial Resource Strain:   . Difficulty of Paying Living Expenses:   Food Insecurity:   . Worried About Programme researcher, broadcasting/film/video in the Last Year:   .  Ran Out of Food in the Last Year:   Transportation Needs:   . Freight forwarder (Medical):   Marland Kitchen Lack of Transportation (Non-Medical):   Physical Activity:   . Days of Exercise per Week:   . Minutes of Exercise per Session:   Stress:   . Feeling of Stress :   Social Connections:   . Frequency of Communication with Friends and Family:   . Frequency of Social Gatherings with Friends and Family:   . Attends Religious Services:   . Active Member of Clubs or Organizations:   . Attends Banker Meetings:   Marland Kitchen Marital Status:   Intimate Partner Violence:   . Fear of Current or Ex-Partner:   . Emotionally Abused:   Marland Kitchen Physically Abused:   . Sexually Abused:      Family History: No family history on file.  Medications:   Current Outpatient Medications on File Prior to Visit  Medication Sig Dispense Refill  . Accu-Chek FastClix Lancets MISC USE AS DIRECTED UP TO FOUR TIMES DAILY 102 each 5  . ACCU-CHEK GUIDE test strip USE AS DIRECTED UP TO FOUR TIMES DAILY 100 strip 5  . acetaminophen (TYLENOL) 325 MG tablet Place 1-2 tablets (325-650 mg total) into feeding tube every 4 (four) hours as needed for mild pain.    . Amino Acids-Protein Hydrolys (FEEDING SUPPLEMENT, PRO-STAT SUGAR FREE 64,) LIQD Place 30 mLs into feeding tube 2 (two) times daily. 887 mL 6  . aspirin 81 MG chewable tablet Place 1 tablet (81 mg total) into feeding tube daily.    Marland Kitchen atorvastatin (LIPITOR) 40 MG tablet Take 1 tablet (40 mg total) by mouth at bedtime. For cholesterol. 90 tablet 3  . budesonide (PULMICORT) 0.25 MG/2ML nebulizer solution Take 2 mLs (0.25 mg total) by nebulization 2 (two) times daily. 60 mL 1  . chlorhexidine (PERIDEX) 0.12 % solution 15 mLs by Mouth Rinse route 2 (two) times daily. 1893 mL 3  . finasteride (PROSCAR) 5 MG tablet Place one tablet in GI tube once daily for urine flow. 90 tablet 1  . fluticasone (FLONASE) 50 MCG/ACT nasal spray Place 1 spray into both nostrils 2 (two) times daily as needed for allergies or rhinitis. 15.8 mL 2  . gabapentin (NEURONTIN) 250 MG/5ML solution Take 2.5-5 mLs (125-250 mg total) by mouth at bedtime. For pain. 150 mL 6  . glycopyrrolate (ROBINUL) 1 MG tablet PLACE 2 TABLETS (2 MG TOTAL) INTO FEEDING TUBE 3 (THREE) TIMES DAILY. FOR ORAL SECRETIONS. 180 tablet 1  . guaifenesin (ROBITUSSIN) 100 MG/5ML syrup PLACE 10 MLS (200 MG TOTAL) INTO FEEDING TUBE 4 (FOUR) TIMES DAILY - WITH MEALS AND AT BEDTIME. 948 mL 0  . guaiFENesin-dextromethorphan (ROBITUSSIN DM) 100-10 MG/5ML syrup Place 15 mLs into feeding tube every 6 (six) hours as needed for cough. 473 mL 0  . Insulin Glargine (BASAGLAR KWIKPEN [place]) Inject 15 Units into the skin  every morning.    . Insulin Pen Needle (BD PEN NEEDLE MICRO U/F) 32G X 6 MM MISC Use daily with insulin. 100 each 2  . lidocaine (LIDODERM) 5 % Apply at 8 pm and remove at 8 am daily. 30 patch 0  . mouth rinse LIQD solution 15 mLs by Mouth Rinse route 5 (five) times daily. 946 mL 1  . Nutritional Supplements (FEEDING SUPPLEMENT, OSMOLITE 1.5 CAL,) LIQD Place 325 mLs into feeding tube 4 (four) times daily.  0  . sennosides (SENOKOT) 8.8 MG/5ML syrup Place 5 mLs into feeding tube 2 (  two) times daily as needed for mild constipation. 240 mL 0  . Water For Irrigation, Sterile (FREE WATER) SOLN Place 200 mLs into feeding tube every 8 (eight) hours. Use filtered or bottled water (not distilled water)     No current facility-administered medications on file prior to visit.    Allergies:  No Known Allergies   Physical Exam  Vitals:   01/01/20 0734  BP: 136/76  Pulse: (!) 106  Weight: (!) 306 lb (138.8 kg)  Height: 6' (1.829 m)   Body mass index is 41.5 kg/m. No exam data present  General: Morbidly obese very pleasant middle-age African-American male, seated, in no evident distress Head: head normocephalic and atraumatic.   Neck: supple with no carotid or supraclavicular bruits Cardiovascular: regular rate and rhythm, no murmurs Musculoskeletal: no deformity Skin:  no rash/petichiae; PEG tube intact without signs of infection Vascular:  Normal pulses all extremities   Neurologic Exam Mental Status: Awake and fully alert.   Mild dysarthria (per patient, d/t increased mucus/saliva).  Oriented to place and time. Recent and remote memory intact. Attention span, concentration and fund of knowledge appropriate. Mood and affect appropriate.  Cranial Nerves: Pupils equal, briskly reactive to light. Extraocular movements full without nystagmus. Visual fields full to confrontation. Hearing intact. Facial sensation intact.  Right lower facial weakness Motor: Normal bulk and tone.  Normal strength  left upper and lower extremity RUE: 5/5 strength with slightly decreased left hand dexterity and grip strength RLE: 5/5 -improvement from prior Sensory.: intact to touch , pinprick , position and vibratory sensation.  Coordination: Rapid alternating movements normal in all extremities. Finger-to-nose and heel-to-shin performed accurately bilaterally. Gait and Station: Arises from chair with mild difficulty. Stance is normal. Gait demonstrates normal stride length and balance with use of Rollator walker Reflexes: 1+ and symmetric. Toes downgoing.       Diagnostic Data (Labs, Imaging, Testing)   CT head No acute abnormality. Mild small vessel disease.     MRI  Small lateral L medullary infarct. Small vessel disease white matter and pons.   CTA head & neck no ELVO. Diffuse intracranial atherosclerosis. Mod supraclinoid B ICAs. Mild stenosis L VA origin  2D Echo EF 60-65%. No source of embolus   LDL 135  HgbA1c 10.6    ASSESSMENT: Michael Bright is a 64 y.o. 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 Vascular risk factors include HTN, HLD, DM, EtOH use and morbid obesity.  Prolonged hospital course due to complications including hypoxic respiratory failure with placement of tracheostomy, PEG tube due to dysphagia post stroke, neurogenic bladder post stroke and aspiration pneumonia.  He returned shortly after CIR discharge due to septic shock requiring additional inpatient rehab stay.  Trach tube removed and improvement of neurogenic bladder.  Continues to improve from a stroke standpoint with residual mild decreased RUE dexterity with pain, right facial droop, and dysphagia s/p PEG.     PLAN:  1. Left lateral medullary syndrome: Continue aspirin 81 mg daily  and atorvastatin for secondary stroke prevention. Maintain strict  control of hypertension with blood pressure goal below 130/90, diabetes with hemoglobin A1c goal below 6.5% and cholesterol with LDL cholesterol (bad cholesterol) goal below 70 mg/dL.  I also advised the patient to eat a healthy diet with plenty of whole grains, cereals, fruits and vegetables, exercise regularly with at least 30 minutes of continuous  activity daily and maintain ideal body weight. 2. HTN: Advised to continue current treatment regimen.  Today's BP stable.  Advised to continue to monitor at home along with continued follow-up with PCP for management 3. HLD: Advised to continue current treatment regimen along with continued follow-up with PCP for future prescribing and monitoring of lipid panel 4. DMII: Recent A1c 5.7 -congratulated on this improvement.  Advised to continue to monitor glucose levels at home along with continued follow-up with PCP for management and monitoring 5. RUE weakness with pain, poststroke: Ongoing participation with outpatient therapies and ongoing use of gabapentin 250 mg nightly.  May consider increasing dose in the future after improvement of acid reflux symptoms.  Continue to follow with Dr. Riley Kill (PMR) 6. Dysphagia s/p PEG, poststroke: Continue to work with outpatient speech therapy for ongoing improvement and possible further diet advancement.  Continue to work with nutrition/dietary for further recommendations regarding ongoing tube feeds 7. Likely GERD: Initiate Protonix 40 mg daily for moderate to severe acid reflux complaints which increased suctioning needs and concern for increased risk of aspiration.  Advised to continue for 6 to 8 weeks and to follow-up with PCP if no improvement for further evaluation and possible needed GI consult.  Also advised to avoid acidic drinks such as tomato and citrus juice.    Follow up in 3 months or call earlier if needed   I spent 32 minutes of face-to-face and non-face-to-face time with patient and wife.  This included  previsit chart review, lab review, study review, order entry, electronic health record documentation, patient education     Ihor Austin, [facility]  [facility] [address] [place], [place] [address]  Phone [phone] Fax [phone] Note: This document was prepared with digital dictation and possible smart phrase technology. Any transcriptional errors that result from this process are unintentional.

## 2020-01-02 ENCOUNTER — Other Ambulatory Visit: Payer: Self-pay

## 2020-01-02 ENCOUNTER — Ambulatory Visit: Payer: BC Managed Care – PPO | Admitting: Occupational Therapy

## 2020-01-02 ENCOUNTER — Ambulatory Visit: Payer: BC Managed Care – PPO

## 2020-01-02 DIAGNOSIS — R278 Other lack of coordination: Secondary | ICD-10-CM

## 2020-01-02 DIAGNOSIS — R208 Other disturbances of skin sensation: Secondary | ICD-10-CM

## 2020-01-02 DIAGNOSIS — M6281 Muscle weakness (generalized): Secondary | ICD-10-CM

## 2020-01-02 DIAGNOSIS — R2681 Unsteadiness on feet: Secondary | ICD-10-CM

## 2020-01-02 DIAGNOSIS — R1312 Dysphagia, oropharyngeal phase: Secondary | ICD-10-CM | POA: Diagnosis not present

## 2020-01-02 NOTE — Patient Instructions (Signed)
1. Grip Strengthening (Resistive Putty)   Squeeze putty using thumb and all fingers. Repeat _20___ times. Do __2__ sessions per day.   2. Roll putty into tube on table and pinch between each finger and thumb x 10 reps each. (can do ring and small finger together)     Copyright  VHI. All rights reserved.   

## 2020-01-02 NOTE — Therapy (Signed)
[facility] [address] [place], [place], [address] Phone: [phone]   Fax:  [phone]  Occupational Therapy Treatment  Patient Details  Name: Michael Bright MRN: [id number] Date of Birth: 1956/04/04 Referring Provider (OT): Dr. Riley Kill   Encounter Date: 01/02/2020  OT End of Session - 01/02/20 1415    Visit Number  14    Number of Visits  25    Date for OT Re-Evaluation  01/24/20    Authorization Type  [employer]    Authorization - Visit Number  14    Authorization - Number of Visits  20    Progress Note Due on Visit  20    OT Start Time  1404    OT Stop Time  1445    OT Time Calculation (min)  41 min    Activity Tolerance  Patient tolerated treatment well    Behavior During Therapy  [facility] for tasks assessed/performed       Past Medical History:  Diagnosis Date  . Acute CVA (cerebrovascular accident) (HCC) 05/30/2019  . Acute kidney injury (HCC)   . Acute respiratory failure (HCC)   . Aspiration pneumonia (HCC) 06/14/2019  . 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 Diabetes (HCC)   . 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 (CVA) 08/09/2019  . Pneumonia    aspiration pneumonia  . Sepsis with acute renal failure without septic shock (HCC)   . Septic shock (HCC)   . Stroke (HCC)   . Urinary tract infection   . Urinary tract infection without hematuria     Past Surgical History:  Procedure Laterality Date  . CHOLECYSTECTOMY    . IR GASTROSTOMY TUBE MOD SED  07/04/2019    There were no vitals filed for this visit.  Subjective Assessment - 01/02/20 1422    Subjective   Pt reports he is not having pain    Patient is accompanied by:  Family member   wife   Pertinent History  Michael Bright is a 64 y.o. 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 He was readmitted on 07/20/19 with septic shock due to Klebsiella pneumoniae bacteremia with UTI and hypoxic respiratory failure bilateral lung infiltrates due to rhino virus. He was intubated briefly from 10/1-10/2 and treated with broad spectrum antibiotics till cultures finalized. Antibiotics narrowed to cefdinir on 10/5 with recommendations to complete 2 weeks total antibiotic course.  Hospital course significant for AKI with abnormal LFTs due to shocked liver, intermittent hypoxic episode as well as significant oral secretions with dysphonia. Discharged home 08/08/19.    Limitations  NPO, feeding tube    Patient Stated Goals  improve coordination and walking    Currently in Pain?  No/denies           Treatment: grooved pegboard with LUE for increased fine motor coordination, min-mod difficulty Closed chain shoulder flexion, x 15 reps min v.c Arm bike x 8 mins level 4 for conditioning Standing to place graded clothespins on vertical antennae with LUE, for sustained pinch, min v.c                OT Treatment/ Education - 01/02/20 1414    Education Details  upgraded previous theraband exercises to red band , green putty exercises for  grip and tip pinch, min v.c   Person(s) Educated  Patient    Methods  Explanation;Demonstration;Verbal cues    Comprehension  Verbalized understanding;Returned demonstration       OT Short Term Goals - 12/08/19 1412      OT SHORT TERM GOAL #1   Title  I with HEP.    Time  4    Period  Weeks    Status  Achieved    Target Date  11/25/19      OT SHORT TERM GOAL #2   Title  Pt will demonstrate improved fine motor coordination for ADLs as evidenced by decreasing 9 hole peg test score to 50 secs or less    Baseline  RUE 35.41, LUE 57.78    Time  4    Period  Weeks    Status  Achieved   47.31     OT  SHORT TERM GOAL #3   Title  Pt will demonstrate aiblity to retrievee light weight object at 110* shoulder flexion with LUE.    Time  4    Period  Weeks    Status  Achieved   >110     OT SHORT TERM GOAL #4   Title  Pt will perfrom light home management/ basic cooking with supervision.    Time  4    Period  Weeks    Status  On-going   not yet addressed, pt can't eat yet       OT Long Term Goals - 12/26/19 1409      OT LONG TERM GOAL #1   Title  I with updated HEP.- 01/24/20    Time  12    Period  Weeks    Status  New      OT LONG TERM GOAL #2   Title  Pt will demonstrate improved LUE fine motor coordination for ADLS as evidenced by decreasing 9 hole peg test score to 42 secs or less.    Time  12    Period  Weeks    Status  On-going   47.85     OT LONG TERM GOAL #3   Title  Pt will retrieve a lightweight object at 120 shoulder flexion with LUE.    Time  12    Period  Weeks    Status  New      OT LONG TERM GOAL #4   Title  Pt will perform mod complex cooking/ home management modified independently    Time  12    Period  Weeks    Status  On-going      OT LONG TERM GOAL #5   Title  Pt will perfrom all basic ADLS modified independently.    Time  12    Period  Weeks    Status  Achieved   per pt report for BADLS           Plan - 01/02/20 1416    Clinical Impression Statement  Pt. demonstrates good overall progress towards goals.Pt was able to progress to red theraband for HEP.    Occupational performance deficits (Please refer to evaluation for details):  ADL's;IADL's;Leisure;Social Participation    Body Structure / Function / Physical Skills  ADL;UE functional use;Flexibility;Pain;FMC;ROM;Coordination;GMC;Sensation;Decreased knowledge of precautions;Decreased knowledge of use of DME;IADL;Dexterity;Strength;Mobility;Endurance;Balance;Vision    Rehab Potential  Good    OT Frequency  2x / week    OT Duration  12 weeks    OT Treatment/Interventions  Self-care/ADL  training;Aquatic Therapy;DME and/or AE  instruction;Patient/family education;Balance training;Passive range of motion;Gait Training;Paraffin;Cryotherapy;Fluidtherapy;Splinting;Therapist, nutritional;Contrast Bath;Electrical Stimulation;Moist Heat;Therapeutic exercise;Manual Therapy;Therapeutic activities;Neuromuscular education    Plan  standing tolerance, continue LUE coordination    Consulted and Agree with Plan of Care  Patient       Patient will benefit from skilled therapeutic intervention in order to improve the following deficits and impairments:   Body Structure / Function / Physical Skills: ADL, UE functional use, Flexibility, Pain, FMC, ROM, Coordination, GMC, Sensation, Decreased knowledge of precautions, Decreased knowledge of use of DME, IADL, Dexterity, Strength, Mobility, Endurance, Balance, Vision       Visit Diagnosis: Other lack of coordination  Muscle weakness (generalized)  Other disturbances of skin sensation  Unsteadiness on feet    Problem List Patient Active Problem List   Diagnosis Date Noted  . Lower extremity edema 11/24/2019  . Difficulty urinating 11/24/2019  . Dupuytren's contracture of left hand 09/20/2019  . Hyperlipidemia 08/24/2019  . Upper and lower extremity pain 08/24/2019  . OSA on CPAP 08/09/2019  . Copious oral secretions 08/09/2019  . Anemia of chronic disease   . Dysphagia due to recent cerebral infarction   . Diabetes mellitus type 2 in obese ([place])   . Morbid obesity ([place])   . Status post insertion of percutaneous endoscopic gastrostomy (PEG) tube ([place])   . History of CVA (cerebrovascular accident) 06/27/2019  . Essential hypertension 06/14/2019  . Renal insufficiency 05/30/2019  . Diplopia   . Chronic fatigue     Michael Bright 01/02/2020, 2:23 PM  [facility] [address] [place], [place], [address] Phone: [phone]   Fax:  [phone]  Name: Michael Bright MRN: [id number] Date of Birth: September 09, 1956

## 2020-01-02 NOTE — Therapy (Signed)
[facility] [address] [place], [place], [address] Phone: [phone]   Fax:  [phone]  Speech Language Pathology Treatment  Patient Details  Name: Michael Bright MRN: [id number] Date of Birth: [birth year] Referring Provider (SLP): Dr. Naaman Plummer   Encounter Date: 01/02/2020  End of Session - 01/02/20 1523    Visit Number  16    Number of Visits  25    Date for SLP Re-Evaluation  02/16/20    SLP Start Time  1449    SLP Stop Time   1518    SLP Time Calculation (min)  29 min    Activity Tolerance  Patient tolerated treatment well       Past Medical History:  Diagnosis Date  . Acute CVA (cerebrovascular accident) ([place]) 05/30/2019  . Acute kidney injury ([place])   . Acute respiratory failure ([place])   . Aspiration pneumonia ([place]) 06/14/2019  . 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 Diabetes ([place])   . 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 (CVA) 08/09/2019  . Pneumonia    aspiration pneumonia  . Sepsis with acute renal failure without septic shock ([place])   . Septic shock ([place])   . Stroke ([place])   . Urinary tract infection   . Urinary tract infection without hematuria     Past Surgical History:  Procedure Laterality Date  . CHOLECYSTECTOMY    . IR GASTROSTOMY TUBE MOD SED  07/04/2019    There were no vitals filed for this visit.  Subjective Assessment - 01/02/20 1459    Subjective  "That reflux is real bad today. She's got me some new food to try but it hasn't come yet."    Currently in Pain?  No/denies            ADULT SLP TREATMENT - 01/02/20 1500      General Information   Behavior/Cognition  Alert;Pleasant mood;Cooperative      Treatment Provided   Treatment provided  Dysphagia      Dysphagia Treatment   Other treatment/comments  Pt politely refused POs today due to severity of gastro-esophageal reflux. SLP placed NMES on placement 3b with therapeutic intensity at 16.0  initially incr'd to 16.36m by end of session. Pt performance on HEP (jaw opening against resisitance, effortful, and chin tuck against resistance were WNL, pt req'd min A and demo for EMST hard, forced blow like blowing candle out. Pt was independent after SLP demo. Pt chose to end session early due to level of reflux he was experiencing.       Assessment / Recommendations / Plan   Plan  Continue with current plan of care      Progression Toward Goals   Progression toward goals  Progressing toward goals         SLP Short Term Goals - 12/15/19 1510      SLP SHORT TERM GOAL #1   Title  Pt will complete dysphagia HEP with rare min A x 3 sessions.    Status  Partially Met      SLP SHORT TERM GOAL #2   Title  Patient will tolerate electrode placement for NMES x 3 sessions.    Baseline  12-05-19, 12-11-19    Status  Achieved      SLP SHORT TERM GOAL #3   Title  Patient will tell SLP 3 s/sx of aspiration PNA.    Status  Achieved       SLP Long Term Goals - 01/02/20 1525  SLP LONG TERM GOAL #1   Title  Pt will demonstrate dysphagia HEP with modified independence x3 sessions.    Time  2    Period  Weeks    Status  On-going      SLP LONG TERM GOAL #2   Title  Pt will report thorough oral care prior to water/ice chips x6 sessions.    Baseline  11-29-19, 12-15-19 12-20-19, 12-28-19    Time  2    Period  Weeks    Status  On-going      SLP LONG TERM GOAL #3   Title  pt will tolerate electrode placement for 7 therapy sessions    Baseline  12-11-19, 12-15-19, 12-19-19, 12-20-19, 12-28-19,01-02-20    Time  2    Period  Weeks    Status  On-going      SLP LONG TERM GOAL #4   Title  pt will swallow within 2 seconds of bolus presentation to oral cavity, or in subsequent swallows at least 80% with at least 60 swallows    Time  2    Period  Weeks    Status  On-going       Plan - 01/02/20 1524    Clinical Impression Statement  Pt cont entering ST room without hydrophonia present.  He  continues PEG dependent since hospitalization. See "skilled treatment" for  more details. He reports taking nectar trials at home and also sips of regular water in supine after oral care at home however lately in past 7-10 days this is less due to level of reflux pt is experiencing. Patient has been completing pharyngeal exercises and RMT (expiratory) with improvement in function noted on previous MBS (10-18-19).  I recommend cont'd skilled ST to address dysphagia for training in aspiration precautions, pharyngeal exercises, and  NMES to improve pt's swallow function for possible advancement to comfort feeds. Advise nectar thick liquid trials and (per MBSS) thin  liquids in supine as much as pt is comfortable.    Speech Therapy Frequency  3x / week   or 25 total visits   Duration  --   8 weeks or 17 visits   Treatment/Interventions  Aspiration precaution training;Cueing hierarchy;SLP instruction and feedback;Pharyngeal strengthening exercises;NMES;Compensatory techniques;Diet toleration management by SLP;Trials of upgraded texture/liquids;Patient/family education    Potential to Achieve Goals  Good    Potential Considerations  Severity of impairments    SLP Home Exercise Plan  Continue swallow ex and RMT per [facility] SLP; bring exercises to next session    Consulted and Agree with Plan of Care  Patient;Family member/caregiver       Patient will benefit from skilled therapeutic intervention in order to improve the following deficits and impairments:   Oropharyngeal dysphagia    Problem List Patient Active Problem List   Diagnosis Date Noted  . Lower extremity edema 11/24/2019  . Difficulty urinating 11/24/2019  . Dupuytren's contracture of left hand 09/20/2019  . Hyperlipidemia 08/24/2019  . Upper and lower extremity pain 08/24/2019  . OSA on CPAP 08/09/2019  . Copious oral secretions 08/09/2019  . Anemia of chronic disease   . Dysphagia due to recent cerebral infarction   . Diabetes mellitus  type 2 in obese ([place])   . Morbid obesity ([place])   . Status post insertion of percutaneous endoscopic gastrostomy (PEG) tube ([place])   . History of CVA (cerebrovascular accident) 06/27/2019  . Essential hypertension 06/14/2019  . Renal insufficiency 05/30/2019  . Diplopia   . Chronic fatigue  [facility] ,[place], [place]  01/02/2020, 3:26 PM  [facility] [address] [place], [place], [address] Phone: [phone]   Fax:  [phone]   Name: Terrelle Ruffolo MRN: [id number] Date of Birth: 10/03/1956

## 2020-01-03 ENCOUNTER — Ambulatory Visit: Payer: BC Managed Care – PPO

## 2020-01-03 DIAGNOSIS — R1312 Dysphagia, oropharyngeal phase: Secondary | ICD-10-CM

## 2020-01-03 NOTE — Therapy (Signed)
[facility] [address] [place], [place], [address] Phone: [phone]   Fax:  [phone]  Speech Language Pathology Treatment  Patient Details  Name: Michael Bright MRN: [id number] Date of Birth: 01/05/1956 Referring Provider (SLP): Dr. Naaman Plummer   Encounter Date: 01/03/2020  End of Session - 01/03/20 1616    Visit Number  17    Number of Visits  25    Date for SLP Re-Evaluation  02/16/20    Authorization Type  BCBS Primary, Medicare secondary    SLP Start Time  1535    SLP Stop Time   1605    SLP Time Calculation (min)  30 min    Activity Tolerance  Patient tolerated treatment well       Past Medical History:  Diagnosis Date  . Acute CVA (cerebrovascular accident) ([place]) 05/30/2019  . Acute kidney injury ([place])   . Acute respiratory failure ([place])   . Aspiration pneumonia ([place]) 06/14/2019  . 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 Diabetes ([place])   . 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 (CVA) 08/09/2019  . Pneumonia    aspiration pneumonia  . Sepsis with acute renal failure without septic shock ([place])   . Septic shock ([place])   . Stroke ([place])   . Urinary tract infection   . Urinary tract infection without hematuria     Past Surgical History:  Procedure Laterality Date  . CHOLECYSTECTOMY    . IR GASTROSTOMY TUBE MOD SED  07/04/2019    There were no vitals filed for this visit.  Subjective Assessment - 01/03/20 1550    Subjective  "The reflux is still real bad. We can just do what we did yesterday again."    Currently in Pain?  No/denies            ADULT SLP TREATMENT - 01/03/20 1553      General Information   Behavior/Cognition  Alert;Pleasant mood;Cooperative      Treatment Provided   Treatment provided  Dysphagia      Cognitive-Linquistic Treatment   Skilled Treatment  SLP used placement 3a with mA at 17.0 for exercises today. Pt performed HEP for SLP with SLP with rare  min A (full breath on EMST).  Pt with delay of up to 8 seconds by 10th rep of effortful swallow (set 1), delay of 7 seconds by rep 9 on set 2, and delay 10 seconds or more by 5th rep of set 3 (each set 10 swallows). Pt req'd min A rarely for full breath with EMST. SLP offered again to take pt to gym and work with thin liquids in supine but pt c./o cont'd significnat acid reflux and delcined.       Assessment / Recommendations / Plan   Plan  Continue with current plan of care      Progression Toward Goals   Progression toward goals  Progressing toward goals         SLP Short Term Goals - 12/15/19 1510      SLP SHORT TERM GOAL #1   Title  Pt will complete dysphagia HEP with rare min A x 3 sessions.    Status  Partially Met      SLP SHORT TERM GOAL #2   Title  Patient will tolerate electrode placement for NMES x 3 sessions.    Baseline  12-05-19, 12-11-19    Status  Achieved      SLP SHORT TERM GOAL #3   Title  Patient will tell SLP 3 s/sx of aspiration PNA.    Status  Achieved       SLP Long Term Goals - 01/03/20 1617      SLP LONG TERM GOAL #1   Title  Pt will demonstrate dysphagia HEP with modified independence x3 sessions.    Time  2    Period  Weeks    Status  On-going      SLP LONG TERM GOAL #2   Title  Pt will report thorough oral care prior to water/ice chips x6 sessions.    Baseline  11-29-19, 12-15-19 12-20-19, 12-28-19    Time  2    Period  Weeks    Status  On-going      SLP LONG TERM GOAL #3   Title  pt will tolerate electrode placement for 7 therapy sessions    Baseline  12-11-19, 12-15-19, 12-19-19, 12-20-19, 12-28-19,01-02-20, 01-03-20    Status  Achieved      SLP LONG TERM GOAL #4   Title  pt will swallow within 5 seconds of bolus presentation to oral cavity, or in subsequent swallows at least 80% with at least 60 swallows    Time  2    Period  Weeks    Status  Revised       Plan - 01/03/20 1617    Clinical Impression Statement  Pt cont entering ST room without  hydrophonia present.  He continues PEG dependent since hospitalization. See "skilled treatment" for  more details. He reports taking nectar trials at home and also sips of regular water in supine after oral care at home however lately in past ~10 days this is less due to level of reflux pt is experiencing. Patient has been completing pharyngeal exercises and RMT (expiratory) with improvement in function noted on previous MBS (10-18-19).  I recommend cont'd skilled ST to address dysphagia for training in aspiration precautions, pharyngeal exercises, and  NMES to improve pt's swallow function for possible advancement to comfort feeds. Advise nectar thick liquid trials and (per MBSS) thin  liquids in supine as much as pt is comfortable.    Speech Therapy Frequency  3x / week   or 25 total visits   Duration  --   8 weeks or 17 visits   Treatment/Interventions  Aspiration precaution training;Cueing hierarchy;SLP instruction and feedback;Pharyngeal strengthening exercises;NMES;Compensatory techniques;Diet toleration management by SLP;Trials of upgraded texture/liquids;Patient/family education    Potential to Achieve Goals  Good    Potential Considerations  Severity of impairments    SLP Home Exercise Plan  Continue swallow ex and RMT per [facility] SLP; bring exercises to next session    Consulted and Agree with Plan of Care  Patient;Family member/caregiver       Patient will benefit from skilled therapeutic intervention in order to improve the following deficits and impairments:   Oropharyngeal dysphagia    Problem List Patient Active Problem List   Diagnosis Date Noted  . Lower extremity edema 11/24/2019  . Difficulty urinating 11/24/2019  . Dupuytren's contracture of left hand 09/20/2019  . Hyperlipidemia 08/24/2019  . Upper and lower extremity pain 08/24/2019  . OSA on CPAP 08/09/2019  . Copious oral secretions 08/09/2019  . Anemia of chronic disease   . Dysphagia due to recent cerebral infarction    . Diabetes mellitus type 2 in obese ([place])   . Morbid obesity ([place])   . Status post insertion of percutaneous endoscopic gastrostomy (PEG) tube ([place])   . History of CVA (cerebrovascular  accident) 06/27/2019  . Essential hypertension 06/14/2019  . Renal insufficiency 05/30/2019  . Diplopia   . Chronic fatigue     Lovey Crupi ,MS, CCC-SLP  01/03/2020, 4:19 PM  [facility] [address] [place], [place], [address] Phone: [phone]   Fax:  [phone]   Name: Michael Bright MRN: [id number] Date of Birth: 08-31-1956

## 2020-01-05 ENCOUNTER — Ambulatory Visit: Payer: BC Managed Care – PPO

## 2020-01-05 ENCOUNTER — Other Ambulatory Visit: Payer: Self-pay

## 2020-01-05 DIAGNOSIS — M6281 Muscle weakness (generalized): Secondary | ICD-10-CM

## 2020-01-05 DIAGNOSIS — R1312 Dysphagia, oropharyngeal phase: Secondary | ICD-10-CM | POA: Diagnosis not present

## 2020-01-05 DIAGNOSIS — R2689 Other abnormalities of gait and mobility: Secondary | ICD-10-CM

## 2020-01-05 NOTE — Therapy (Signed)
[facility] [address] [place], [place], [address] Phone: [phone]   Fax:  [phone]  Speech Language Pathology Treatment  Patient Details  Name: Michael Bright MRN: [id number] Date of Birth: [birth year] Referring Provider (SLP): Dr. Naaman Plummer   Encounter Date: 01/05/2020  End of Session - 01/05/20 1614    Visit Number  18    Number of Visits  25    Date for SLP Re-Evaluation  02/16/20    Authorization Type  BCBS Primary, Medicare secondary    SLP Start Time  1450    SLP Stop Time   1525    SLP Time Calculation (min)  35 min    Activity Tolerance  Patient tolerated treatment well       Past Medical History:  Diagnosis Date  . Acute CVA (cerebrovascular accident) ([place]) 05/30/2019  . Acute kidney injury ([place])   . Acute respiratory failure ([place])   . Aspiration pneumonia ([place]) 06/14/2019  . 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 Diabetes ([place])   . 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 (CVA) 08/09/2019  . Pneumonia    aspiration pneumonia  . Sepsis with acute renal failure without septic shock ([place])   . Septic shock ([place])   . Stroke ([place])   . Urinary tract infection   . Urinary tract infection without hematuria     Past Surgical History:  Procedure Laterality Date  . CHOLECYSTECTOMY    . IR GASTROSTOMY TUBE MOD SED  07/04/2019    There were no vitals filed for this visit.  Subjective Assessment - 01/05/20 1531    Subjective  "I'm gonna have to stop by the pharmacy and see if I can buy any (food) over the counter. My insurance company don't want to pay for it."    Currently in Pain?  Yes    Pain Location  Leg    Pain Orientation  Left    Pain Type  Acute pain            ADULT SLP TREATMENT - 01/05/20 1532      General Information   Behavior/Cognition  Alert;Pleasant mood;Cooperative      Treatment Provided   Treatment provided  Dysphagia      Cognitive-Linquistic  Treatment   Skilled Treatment  SLP used placement 3b- 16.5 mA - pt asked SLP to work with exercises and not POs as pt still having severe GERD issues. Pt performed HEP with SLP with occasional min A (full breath on EMST, 2 second hold on jaw opening against resistenace). Pt with delay of up to 7 seconds by 10th rep of effortful swallow (set 1), delay of 8 seconds by rep 9 on set 2, and delay 10 seconds or more by 7th rep of set 3 (each set 10 swallows).       Assessment / Recommendations / Plan   Plan  Continue with current plan of care       SLP Education - 01/05/20 1613    Education Details  Procedure (occasional min A) for HEP    Person(s) Educated  Patient    Methods  Explanation;Demonstration;Verbal cues    Comprehension  Verbal cues required;Verbalized understanding;Returned demonstration       SLP Short Term Goals - 12/15/19 1510      SLP SHORT TERM GOAL #1   Title  Pt will complete dysphagia HEP with rare min A x 3 sessions.    Status  Partially Met  SLP SHORT TERM GOAL #2   Title  Patient will tolerate electrode placement for NMES x 3 sessions.    Baseline  12-05-19, 12-11-19    Status  Achieved      SLP SHORT TERM GOAL #3   Title  Patient will tell SLP 3 s/sx of aspiration PNA.    Status  Achieved       SLP Long Term Goals - 01/05/20 1615      SLP LONG TERM GOAL #1   Title  Pt will demonstrate dysphagia HEP with modified independence x3 sessions.    Time  2    Period  Weeks    Status  On-going      SLP LONG TERM GOAL #2   Title  Pt will report thorough oral care prior to water/ice chips x6 sessions.    Baseline  11-29-19, 12-15-19 12-20-19, 12-28-19    Time  2    Period  Weeks    Status  On-going      SLP LONG TERM GOAL #3   Title  pt will tolerate electrode placement for 7 therapy sessions    Baseline  12-11-19, 12-15-19, 12-19-19, 12-20-19, 12-28-19,01-02-20, 01-03-20    Status  Achieved      SLP LONG TERM GOAL #4   Title  pt will swallow within 5 seconds of  bolus presentation to oral cavity, or in subsequent swallows at least 80% with at least 60 swallows    Time  2    Period  Weeks    Status  On-going       Plan - 01/05/20 1614    Clinical Impression Statement  Pt cont entering ST room without hydrophonia present.  He continues PEG dependent since hospitalization. See "skilled treatment" for  more details. He reports taking nectar trials at home and also sips of regular water in supine after oral care at home however lately in past ~10 days this is less due to level of reflux pt is experiencing. Patient has been completing pharyngeal exercises and RMT (expiratory) with improvement in function noted on previous MBS (10-18-19).  I recommend cont'd skilled ST to address dysphagia for training in aspiration precautions, pharyngeal exercises, and  NMES to improve pt's swallow function for possible advancement to comfort feeds. Advise nectar thick liquid trials and (per MBSS) thin  liquids in supine as much as pt is comfortable.    Speech Therapy Frequency  3x / week   or 25 total visits   Duration  --   8 weeks or 17 visits   Treatment/Interventions  Aspiration precaution training;Cueing hierarchy;SLP instruction and feedback;Pharyngeal strengthening exercises;NMES;Compensatory techniques;Diet toleration management by SLP;Trials of upgraded texture/liquids;Patient/family education    Potential to Achieve Goals  Good    Potential Considerations  Severity of impairments    SLP Home Exercise Plan  Continue swallow ex and RMT per [facility] SLP; bring exercises to next session    Consulted and Agree with Plan of Care  Patient;Family member/caregiver       Patient will benefit from skilled therapeutic intervention in order to improve the following deficits and impairments:   Oropharyngeal dysphagia    Problem List Patient Active Problem List   Diagnosis Date Noted  . Lower extremity edema 11/24/2019  . Difficulty urinating 11/24/2019  . Dupuytren's  contracture of left hand 09/20/2019  . Hyperlipidemia 08/24/2019  . Upper and lower extremity pain 08/24/2019  . OSA on CPAP 08/09/2019  . Copious oral secretions 08/09/2019  . Anemia of chronic  disease   . Dysphagia due to recent cerebral infarction   . Diabetes mellitus type 2 in obese ([place])   . Morbid obesity ([place])   . Status post insertion of percutaneous endoscopic gastrostomy (PEG) tube ([place])   . History of CVA (cerebrovascular accident) 06/27/2019  . Essential hypertension 06/14/2019  . Renal insufficiency 05/30/2019  . Diplopia   . Chronic fatigue     Maisha Bogen ,MS, CCC-SLP  01/05/2020, 4:15 PM  [facility] [address] [place], [place], [address] Phone: [phone]   Fax:  [phone]   Name: Michael Bright MRN: [id number] Date of Birth: 04-08-1956

## 2020-01-05 NOTE — Therapy (Signed)
[facility] [address] [place], [place], [address] Phone: [phone]   Fax:  [phone]  Physical Therapy Treatment  Patient Details  Name: Michael Bright MRN: [id number] Date of Birth: 04/26/1956 Referring Provider (PT): Faith Rogue, MD   Encounter Date: 01/05/2020  PT End of Session - 01/05/20 1411    Visit Number  13    Number of Visits  25    Date for PT Re-Evaluation  01/29/20    Authorization Type  BCBS - 2021 primary, Medicare Secondary    PT Start Time  1406    PT Stop Time  1445    PT Time Calculation (min)  39 min    Equipment Utilized During Treatment  Gait belt    Activity Tolerance  Patient tolerated treatment well    Behavior During Therapy  [facility] for tasks assessed/performed       Past Medical History:  Diagnosis Date  . Acute CVA (cerebrovascular accident) ([facility]) 05/30/2019  . Acute kidney injury ([facility])   . Acute respiratory failure ([facility])   . Aspiration pneumonia ([facility]) 06/14/2019  . 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 Diabetes ([facility])   . 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 (CVA) 08/09/2019  . Pneumonia    aspiration pneumonia  . Sepsis with acute renal failure without septic shock ([facility])   . Septic shock ([facility])   . Stroke ([facility])   . Urinary tract infection   . Urinary tract infection without hematuria     Past Surgical History:  Procedure Laterality Date  . CHOLECYSTECTOMY    . IR GASTROSTOMY TUBE MOD SED  07/04/2019    There were no vitals filed for this visit.  Subjective Assessment - 01/05/20 1409    Subjective  Pt reports that he is having issues with reflux. Neurology prescribed protonix but his insurance won't cover. Advised to let them know and may have another option.    Pertinent History  PMH: CVA (L lateral medullary syndrome) diabetes, HTN, OSA, morbid obesity.    Limitations  Standing;Walking;House hold activities    How long can you stand  comfortably?  can stand approx. 10 minutes    How long can you walk comfortably?  20 minutes with the rollator    Patient Stated Goals  wants to walk better, walk more steady, wants to be able to get up more easily.    Currently in Pain?  Yes    Pain Score  4     Pain Location  Leg    Pain Orientation  Left;Lateral    Pain Descriptors / Indicators  Sore    Pain Type  Acute pain    Pain Onset  More than a month ago    Pain Relieving Factors  when gets up and moves it    Pain Onset  More than a month ago                       [facility] Adult PT Treatment/Exercise - 01/05/20 1411      Ambulation/Gait   Ambulation/Gait  Yes    Ambulation/Gait Assistance  5: Supervision    Ambulation/Gait Assistance Details  Verbal cues to increase step length and foot clearance. Added in head turns right/left and up/down for 100' each.    Ambulation Distance (Feet)  460 Feet    Assistive device  Rollator    Gait Pattern  Step-through pattern;Decreased step length - right;Decreased step length - left  Ambulation Surface  Level;Indoor      Neuro Re-ed    Neuro Re-ed Details   In // bars: Gait with 1 UE support 6' x 6, backwards gait with 1 UE support and marching forwards with 1 UE support 6' x 6. Pt had less control with left leg with marching with decreased stance time and hip flexion. Standing with right foot on 4" step to increase left weight bearing without UE support 30 sec x 2 CGA. Step-ups on 4" step with left leg x 10 with right UE support. Reciprocal steps over 3 foam beams with right UE support x 8. Side stepping over 3 foam beams x 6 with bilateral UE support. Cued to be sure to leave room for trailing foot. More difficulty with going towards left.       Knee/Hip Exercises: Aerobic   Stepper  Seated SciFit level 2.5 x 5 min with hands and arms. HR=106 and BP=168/88             PT Education - 01/05/20 1720    Education Details  Pt to continue with current HEP    Person(s)  Educated  Patient    Methods  Explanation    Comprehension  Verbalized understanding       PT Short Term Goals - 12/14/19 2127      PT SHORT TERM GOAL #1   Title  Patient will be independent with initial HEP in order to build upon functional gains made in therapy.  ALL STGS DUE 01/11/20    Baseline  pt will benefit from on-going additions to HEP    Time  4    Period  Weeks    Status  On-going    Target Date  01/11/20      PT SHORT TERM GOAL #2   Title  Patient to improve BERG balance test to at least 38/56 for decreased fall risk.    Baseline  35/56 on 12/13/19    Time  4    Period  Weeks    Status  Revised      PT SHORT TERM GOAL #3   Title  Patient will decrease TUG time with bariatric rollator to 24 seconds of less to demo decr fall risk.    Baseline  30.22 seconds on 10/31/19; 25.93 sec 12/11/2019    Time  4    Period  Weeks    Status  Revised      PT SHORT TERM GOAL #4   Title  Patient will improve gait velocity with bariatric rollator to at least 2.0 ft/sec to demo a limited community ambulator and decr fall risk.    Baseline  1.1 ft/sec with L AFO and bariatric rollator; 1.6 ft/sec 12/11/2019    Time  4    Period  Weeks    Status  Revised      PT SHORT TERM GOAL #5   Title  Patient will decrease 5x sit <> stand time to 25 seconds of less from elevated mat table with UE support in order to demo improved functional LE strength.    Baseline  31.06 seconds; 27.19 sec 12/11/2019    Time  4    Period  Weeks    Status  On-going        PT Long Term Goals - 11/20/19 1711      PT LONG TERM GOAL #1   Title  Patient will be independent with final HEP in order to build upon functional gains made in  therapy. ALL LTGS DUE 01/29/20    Time  13   due to delay in scheduling   Period  Weeks    Status  New      PT LONG TERM GOAL #2   Title  Berg score to imporve to at least 40/56 for decreased fall risk.    Time  13    Period  Weeks    Status  Revised      PT LONG TERM GOAL  #3   Title  Patient will decrease 5x sit <> stand time to 19 seconds of less from elevated mat table vs. standard chair with UE support in order to demo improved functional LE strength.    Time  13    Period  Weeks    Status  New      PT LONG TERM GOAL #4   Title  Patient will improve gait velocity with bariatric rollator vs. LRAD to at least 2.6 ft/sec to demo improved community mobility.    Time  13    Period  Weeks    Status  New      PT LONG TERM GOAL #5   Title  Patient will decrease TUG time with bariatric rollator vs. LRAD to 19 seconds of less to demo decr fall risk.    Time  13    Period  Weeks    Status  New      PT LONG TERM GOAL #6   Title  Patient will ambulate at least 300' with supervision over level indoor and outdoor surfaces with LRAD  with supervision in order to improve community mobility.    Time  13    Period  Weeks    Status  New            Plan - 01/05/20 1720    Clinical Impression Statement  PT continued to focus on decreasing UE support with gait activities. Pt was challenged when added in more dynamic activities with less support. Bouncing noted during LLE stance with increased weight bearing/SLS activities.    Personal Factors and Comorbidities  Comorbidity 3+;Past/Current Experience;Time since onset of injury/illness/exacerbation    Comorbidities  diabetes, HTN, OSA, morbid obesity.    Examination-Activity Limitations  Squat;Transfers;Locomotion Level;Stairs    Examination-Participation Restrictions  Community Activity    Stability/Clinical Decision Making  Evolving/Moderate complexity    Rehab Potential  Good    PT Frequency  2x / week    PT Duration  12 weeks    PT Treatment/Interventions  ADLs/Self Care Home Management;Balance training;Therapeutic exercise;Therapeutic activities;Functional mobility training;Stair training;Gait training;DME Instruction;Aquatic Therapy;Neuromuscular re-education;Patient/family education;Orthotic  Fit/Training;Energy conservation    PT Next Visit Plan  Will need to reassess next week as will be 2 week delay in getting back in and past recert.  Lower extremity stretching-consider adding to HEP; Continue strength, balance strategies, compliant surfaces, parallel bars to decrease support; use of aerobic machines for additional BLE strength    PT Home Exercise Plan  [id number] ([employer])    Consulted and Agree with Plan of Care  Patient       Patient will benefit from skilled therapeutic intervention in order to improve the following deficits and impairments:  Abnormal gait, Decreased activity tolerance, Decreased balance, Decreased coordination, Decreased range of motion, Decreased knowledge of use of DME, Decreased knowledge of precautions, Decreased endurance, Decreased strength, Difficulty walking, Postural dysfunction, Impaired sensation, Obesity, Pain  Visit Diagnosis: Muscle weakness (generalized)  Other abnormalities of gait and mobility  Problem List Patient Active Problem List   Diagnosis Date Noted  . Lower extremity edema 11/24/2019  . Difficulty urinating 11/24/2019  . Dupuytren's contracture of left hand 09/20/2019  . Hyperlipidemia 08/24/2019  . Upper and lower extremity pain 08/24/2019  . OSA on CPAP 08/09/2019  . Copious oral secretions 08/09/2019  . Anemia of chronic disease   . Dysphagia due to recent cerebral infarction   . Diabetes mellitus type 2 in obese ([facility])   . Morbid obesity ([facility])   . Status post insertion of percutaneous endoscopic gastrostomy (PEG) tube ([facility])   . History of CVA (cerebrovascular accident) 06/27/2019  . Essential hypertension 06/14/2019  . Renal insufficiency 05/30/2019  . Diplopia   . Chronic fatigue     Ronn Melena, PT, DPT, NCS 01/05/2020, 5:25 PM  [facility] [address] [place], [place], [address] Phone: [phone]   Fax:  [phone]  Name: Amay Mijangos MRN: [id number] Date of Birth: 12/13/1955

## 2020-01-08 ENCOUNTER — Ambulatory Visit: Payer: BC Managed Care – PPO

## 2020-01-08 ENCOUNTER — Other Ambulatory Visit: Payer: Self-pay

## 2020-01-08 DIAGNOSIS — R1312 Dysphagia, oropharyngeal phase: Secondary | ICD-10-CM

## 2020-01-08 NOTE — Therapy (Signed)
[facility] [address] [place], [place], [address] Phone: [phone]   Fax:  [phone]  Speech Language Pathology Treatment  Patient Details  Name: Michael Bright MRN: [id number] Date of Birth: 01/19/1956 Referring Provider (SLP): Dr. Naaman Plummer   Encounter Date: 01/08/2020  End of Session - 01/08/20 1639    Visit Number  19    Number of Visits  25    Date for SLP Re-Evaluation  02/16/20    SLP Start Time  1536    SLP Stop Time   1615    SLP Time Calculation (min)  39 min    Activity Tolerance  Treatment limited secondary to medical complications (Comment)   GERD      Past Medical History:  Diagnosis Date  . Acute CVA (cerebrovascular accident) ([place]) 05/30/2019  . Acute kidney injury ([place])   . Acute respiratory failure ([place])   . Aspiration pneumonia ([place]) 06/14/2019  . 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 Diabetes ([place])   . 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 (CVA) 08/09/2019  . Pneumonia    aspiration pneumonia  . Sepsis with acute renal failure without septic shock ([place])   . Septic shock ([place])   . Stroke ([place])   . Urinary tract infection   . Urinary tract infection without hematuria     Past Surgical History:  Procedure Laterality Date  . CHOLECYSTECTOMY    . IR GASTROSTOMY TUBE MOD SED  07/04/2019    There were no vitals filed for this visit.  Subjective Assessment - 01/08/20 1624    Subjective  "I got the new food but I still have (the reflux)." Pt refused to practice with POs today    Currently in Pain?  No/denies            ADULT SLP TREATMENT - 01/08/20 1625      General Information   Behavior/Cognition  Alert;Pleasant mood;Cooperative      Treatment Provided   Treatment provided  Dysphagia      Cognitive-Linquistic Treatment   Skilled Treatment  NMES placement 3a was used with pt today and therapeutic intensity of 16.5 mA incr'd to 17.53m by session  end.  Again, due to level of reflux pt asked SLP to work with exercises and not POs. Pt performed HEP with SLP with usual min A on EMST (full exhale/inhale). Pt with delay of up to 5 seconds by 10th rep of effortful swallow (set 1),  8 second delay by rep 8 on set 2, and delay up to 12 seconds at 7th rep of set 3 (each set 10 swallows). Last four swallows were completed with avreage 11 second delay.      Assessment / Recommendations / Plan   Plan  Continue with current plan of care      Progression Toward Goals   Progression toward goals  Not progressing toward goals (comment)       SLP Education - 01/08/20 1639    Education Details  DO NOT reduce number of PO swallow practice/sets at home    Person(s) Educated  Patient    Methods  Explanation    Comprehension  Verbalized understanding       SLP Short Term Goals - 12/15/19 1510      SLP SHORT TERM GOAL #1   Title  Pt will complete dysphagia HEP with rare min A x 3 sessions.    Status  Partially Met      SLP SHORT TERM  GOAL #2   Title  Patient will tolerate electrode placement for NMES x 3 sessions.    Baseline  12-05-19, 12-11-19    Status  Achieved      SLP SHORT TERM GOAL #3   Title  Patient will tell SLP 3 s/sx of aspiration PNA.    Status  Achieved       SLP Long Term Goals - 01/08/20 1641      SLP LONG TERM GOAL #1   Title  Pt will demonstrate dysphagia HEP with modified independence x3 sessions.    Time  1    Period  Weeks    Status  On-going      SLP LONG TERM GOAL #2   Title  Pt will report thorough oral care prior to water/ice chips x6 sessions.    Baseline  11-29-19, 12-15-19 12-20-19, 12-28-19    Time  1    Period  Weeks    Status  On-going      SLP LONG TERM GOAL #3   Title  pt will tolerate electrode placement for 7 therapy sessions    Baseline  12-11-19, 12-15-19, 12-19-19, 12-20-19, 12-28-19,01-02-20, 01-03-20    Status  Achieved      SLP LONG TERM GOAL #4   Title  pt will swallow within 5 seconds of bolus  presentation to oral cavity, or in subsequent swallows at least 80% with at least 60 swallows    Time  1    Period  Weeks    Status  On-going       Plan - 01/08/20 1640    Clinical Impression Statement  Pt cont entering ST room without hydrophonia present.  He continues PEG dependent since hospitalization. See "skilled treatment" for  more details. He reports continuing to take nectar trials at home and also sips of regular water in supine after oral care. Pt cont with no trials of PO here in ST session due to reflux pt is experiencing. Patient has been completing pharyngeal exercises and RMT (expiratory) with improvement in function noted on previous MBS (10-18-19).  I recommend cont'd skilled ST to address dysphagia for training in aspiration precautions, pharyngeal exercises, and  NMES to improve pt's swallow function for possible advancement to comfort feeds. Advise nectar thick liquid trials and (per MBSS) thin  liquids in supine as much as pt is comfortable.    Speech Therapy Frequency  3x / week   or 25 total visits   Duration  --   8 weeks or 17 visits   Treatment/Interventions  Aspiration precaution training;Cueing hierarchy;SLP instruction and feedback;Pharyngeal strengthening exercises;NMES;Compensatory techniques;Diet toleration management by SLP;Trials of upgraded texture/liquids;Patient/family education    Potential to Achieve Goals  Good    Potential Considerations  Severity of impairments    SLP Home Exercise Plan  Continue swallow ex and RMT per [facility] SLP; bring exercises to next session    Consulted and Agree with Plan of Care  Patient;Family member/caregiver       Patient will benefit from skilled therapeutic intervention in order to improve the following deficits and impairments:   Oropharyngeal dysphagia    Problem List Patient Active Problem List   Diagnosis Date Noted  . Lower extremity edema 11/24/2019  . Difficulty urinating 11/24/2019  . Dupuytren's contracture  of left hand 09/20/2019  . Hyperlipidemia 08/24/2019  . Upper and lower extremity pain 08/24/2019  . OSA on CPAP 08/09/2019  . Copious oral secretions 08/09/2019  . Anemia of chronic disease   .  Dysphagia due to recent cerebral infarction   . Diabetes mellitus type 2 in obese ([place])   . Morbid obesity ([place])   . Status post insertion of percutaneous endoscopic gastrostomy (PEG) tube ([place])   . History of CVA (cerebrovascular accident) 06/27/2019  . Essential hypertension 06/14/2019  . Renal insufficiency 05/30/2019  . Diplopia   . Chronic fatigue     Mckinnon Glick ,MS, CCC-SLP  01/08/2020, 4:43 PM  [facility] [address] [place], [place], [address] Phone: [phone]   Fax:  [phone]   Name: Obinna Ehresman MRN: [id number] Date of Birth: 1956/03/20

## 2020-01-09 ENCOUNTER — Ambulatory Visit: Payer: BC Managed Care – PPO

## 2020-01-09 ENCOUNTER — Ambulatory Visit: Payer: BC Managed Care – PPO | Admitting: Occupational Therapy

## 2020-01-09 DIAGNOSIS — R2689 Other abnormalities of gait and mobility: Secondary | ICD-10-CM

## 2020-01-09 DIAGNOSIS — R278 Other lack of coordination: Secondary | ICD-10-CM

## 2020-01-09 DIAGNOSIS — R1312 Dysphagia, oropharyngeal phase: Secondary | ICD-10-CM | POA: Diagnosis not present

## 2020-01-09 DIAGNOSIS — M6281 Muscle weakness (generalized): Secondary | ICD-10-CM

## 2020-01-09 DIAGNOSIS — R208 Other disturbances of skin sensation: Secondary | ICD-10-CM

## 2020-01-09 NOTE — Therapy (Signed)
[facility] [address] [place], [place], [address] Phone: [phone]   Fax:  [phone]  Occupational Therapy Treatment  Patient Details  Name: Michael Bright MRN: [id number] Date of Birth: 1955-11-28 Referring Provider (OT): Dr. Naaman Plummer   Encounter Date: 01/09/2020  OT End of Session - 01/09/20 1429    Visit Number  15    Number of Visits  25    Date for OT Re-Evaluation  01/24/20    Authorization Type  [place]    Authorization - Visit Number  15    Authorization - Number of Visits  20    Progress Note Due on Visit  20    OT Start Time  1601    OT Stop Time  1445    OT Time Calculation (min)  40 min    Activity Tolerance  Patient tolerated treatment well    Behavior During Therapy  [facility] for tasks assessed/performed       Past Medical History:  Diagnosis Date  . Acute CVA (cerebrovascular accident) ([place]) 05/30/2019  . Acute kidney injury ([place])   . Acute respiratory failure ([place])   . Aspiration pneumonia ([place]) 06/14/2019  . 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 Diabetes ([place])   . 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 (CVA) 08/09/2019  . Pneumonia    aspiration pneumonia  . Sepsis with acute renal failure without septic shock ([place])   . Septic shock ([place])   . Stroke ([place])   . Urinary tract infection   . Urinary tract infection without hematuria     Past Surgical History:  Procedure Laterality Date  . CHOLECYSTECTOMY    . IR GASTROSTOMY TUBE MOD SED  07/04/2019    There were no vitals filed for this visit.  Subjective Assessment - 01/09/20 1405    Subjective   Pt reports he is not having pain    Patient is accompanied by:  Family member   wife   Pertinent History  Michael Bright is a 64 y.o. 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 He was readmitted on 07/20/19 with septic shock due to Klebsiella pneumoniae bacteremia with UTI and hypoxic respiratory failure bilateral lung infiltrates due to rhino virus. He was intubated briefly from 10/1-10/2 and treated with broad spectrum antibiotics till cultures finalized. Antibiotics narrowed to cefdinir on 10/5 with recommendations to complete 2 weeks total antibiotic course.  Hospital course significant for AKI with abnormal LFTs due to shocked liver, intermittent hypoxic episode as well as significant oral secretions with dysphonia. Discharged home 08/08/19.    Limitations  NPO, feeding tube    Patient Stated Goals  improve coordination and walking    Currently in Pain?  Yes    Pain Score  5     Pain Location  Neck    Pain Descriptors / Indicators  Aching    Pain Type  Chronic pain    Pain Onset  More than a month ago    Pain Frequency  Intermittent    Aggravating Factors   malpositioning    Pain Relieving Factors  repositioning    Multiple Pain Sites  No                 Treatment: Arm bike x  8 mins level 4 for conditioning. Gripper set at level 3 to pick up blocks for sustained grip, min difficulty/ v.c Grooved pegs for increased fine motor coordination, with LUE min difficulty. Therapist started checking progress towards goals, anticipate d/c next visit..           OT Education - 01/09/20 1428    Education Details  upgraded previous theraband exercises to green band 15 reps each, cane exercises for shoulder flexion    Person(s) Educated  Patient    Methods  Explanation;Demonstration;Verbal cues    Comprehension  Verbalized understanding;Returned demonstration       OT Short Term Goals - 12/08/19 1412      OT SHORT TERM GOAL #1   Title  I with HEP.    Time  4    Period  Weeks    Status  Achieved    Target Date   11/25/19      OT SHORT TERM GOAL #2   Title  Pt will demonstrate improved fine motor coordination for ADLs as evidenced by decreasing 9 hole peg test score to 50 secs or less    Baseline  RUE 35.41, LUE 57.78    Time  4    Period  Weeks    Status  Achieved   47.31     OT SHORT TERM GOAL #3   Title  Pt will demonstrate aiblity to retrievee light weight object at 110* shoulder flexion with LUE.    Time  4    Period  Weeks    Status  Achieved   >110     OT SHORT TERM GOAL #4   Title  Pt will perfrom light home management/ basic cooking with supervision.    Time  4    Period  Weeks    Status  On-going   not yet addressed, pt can't eat yet       OT Long Term Goals - 01/09/20 1420      OT LONG TERM GOAL #1   Title  I with updated HEP.- 01/24/20    Time  12    Period  Weeks    Status  On-going      OT LONG TERM GOAL #2   Title  Pt will demonstrate improved LUE fine motor coordination for ADLS as evidenced by decreasing 9 hole peg test score to 42 secs or less.    Time  12    Period  Weeks    Status  On-going   47.85     OT LONG TERM GOAL #3   Title  Pt will retrieve a lightweight object at 120 shoulder flexion with LUE.    Time  12    Period  Weeks    Status  Achieved   130     OT LONG TERM GOAL #4   Title  Pt will perform mod complex cooking/ home management modified independently    Time  12    Period  Weeks    Status  Partially Met   performing laundry, washing dishes, not cooking as pt is NPO     OT LONG TERM GOAL #5   Title  Pt will perfrom all basic ADLS modified independently.    Time  12    Period  Weeks    Status  Achieved   per pt report for BADLS           Plan - 01/09/20 1442    Clinical Impression Statement  Pt demonstrates  excellent progress. Anticipate d/c next visit.    Occupational performance deficits (Please refer to evaluation for details):  ADL's;IADL's;Leisure;Social Participation    Body Structure / Function / Physical Skills   ADL;UE functional use;Flexibility;Pain;FMC;ROM;Coordination;GMC;Sensation;Decreased knowledge of precautions;Decreased knowledge of use of DME;IADL;Dexterity;Strength;Mobility;Endurance;Balance;Vision    Rehab Potential  Good    OT Frequency  2x / week    OT Duration  12 weeks    OT Treatment/Interventions  Self-care/ADL training;Aquatic Therapy;DME and/or AE instruction;Patient/family education;Balance training;Passive range of motion;Gait Training;Paraffin;Cryotherapy;Fluidtherapy;Splinting;Therapist, nutritional;Contrast Bath;Electrical Stimulation;Moist Heat;Therapeutic exercise;Manual Therapy;Therapeutic activities;Neuromuscular education    Plan  check goals, d/c OT next visit    Consulted and Agree with Plan of Care  Patient       Patient will benefit from skilled therapeutic intervention in order to improve the following deficits and impairments:   Body Structure / Function / Physical Skills: ADL, UE functional use, Flexibility, Pain, FMC, ROM, Coordination, GMC, Sensation, Decreased knowledge of precautions, Decreased knowledge of use of DME, IADL, Dexterity, Strength, Mobility, Endurance, Balance, Vision       Visit Diagnosis: Muscle weakness (generalized)  Other abnormalities of gait and mobility  Other lack of coordination  Other disturbances of skin sensation    Problem List Patient Active Problem List   Diagnosis Date Noted  . Lower extremity edema 11/24/2019  . Difficulty urinating 11/24/2019  . Dupuytren's contracture of left hand 09/20/2019  . Hyperlipidemia 08/24/2019  . Upper and lower extremity pain 08/24/2019  . OSA on CPAP 08/09/2019  . Copious oral secretions 08/09/2019  . Anemia of chronic disease   . Dysphagia due to recent cerebral infarction   . Diabetes mellitus type 2 in obese ([place])   . Morbid obesity ([place])   . Status post insertion of percutaneous endoscopic gastrostomy (PEG) tube ([place])   . History of CVA (cerebrovascular accident)  06/27/2019  . Essential hypertension 06/14/2019  . Renal insufficiency 05/30/2019  . Diplopia   . Chronic fatigue     Michael Bright 01/09/2020, 2:58 PM  [facility] [address] [place], [place], [address] Phone: [phone]   Fax:  [phone]  Name: Michael Bright MRN: [id number] Date of Birth: [birth year]

## 2020-01-09 NOTE — Therapy (Signed)
[facility] [address] [place], [place], [address] Phone: [phone]   Fax:  [phone]  Physical Therapy Treatment  Patient Details  Name: Michael Bright MRN: [id number] Date of Birth: 1956/08/27 Referring Provider (PT): Faith Rogue, MD   Encounter Date: 01/09/2020  PT End of Session - 01/09/20 1539    Visit Number  14    Number of Visits  25    Date for PT Re-Evaluation  01/29/20    Authorization Type  BCBS - 2021 primary, Medicare Secondary    PT Start Time  1536    PT Stop Time  1614    PT Time Calculation (min)  38 min    Equipment Utilized During Treatment  Gait belt    Activity Tolerance  Patient tolerated treatment well    Behavior During Therapy  WFL for tasks assessed/performed       Past Medical History:  Diagnosis Date  . Acute CVA (cerebrovascular accident) ([facility]) 05/30/2019  . Acute kidney injury ([facility])   . Acute respiratory failure ([facility])   . Aspiration pneumonia ([facility]) 06/14/2019  . 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 Diabetes ([facility])   . 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 (CVA) 08/09/2019  . Pneumonia    aspiration pneumonia  . Sepsis with acute renal failure without septic shock ([facility])   . Septic shock ([facility])   . Stroke ([facility])   . Urinary tract infection   . Urinary tract infection without hematuria     Past Surgical History:  Procedure Laterality Date  . CHOLECYSTECTOMY    . IR GASTROSTOMY TUBE MOD SED  07/04/2019    There were no vitals filed for this visit.  Subjective Assessment - 01/09/20 1537    Subjective  Pt reports that he will most likely want to end therapy at visit on Thursday. Does not want to do early morning and feels he can keep working on things at home. Will just continue with speech.    Pertinent History  PMH: CVA (L lateral medullary syndrome) diabetes, HTN, OSA, morbid obesity.    Limitations  Standing;Walking;House hold activities     How long can you stand comfortably?  can stand approx. 10 minutes    How long can you walk comfortably?  20 minutes with the rollator    Patient Stated Goals  wants to walk better, walk more steady, wants to be able to get up more easily.    Currently in Pain?  No/denies    Pain Onset  More than a month ago    Pain Onset  More than a month ago                       [facility] Adult PT Treatment/Exercise - 01/09/20 1539      Transfers   Transfers  Sit to Stand;Stand to Sit    Sit to Stand  5: Supervision;4: Min guard    Sit to Stand Details  Verbal cues for precautions/safety;Verbal cues for technique    Stand to Sit  5: Supervision    Stand to Sit Details (indicate cue type and reason)  Verbal cues for technique    Comments  Pt performed sit to stand x 5 from mat and x 5 from rollator. Pt was given verbal cues to bring feet back and lean forward and prevent pushing back. Advised to push straight down when rising from rollator. PT still had to stabilize rollator as would move back  when he tried to rise some.      Ambulation/Gait   Ambulation/Gait  Yes    Ambulation/Gait Assistance  5: Supervision    Ambulation/Gait Assistance Details  Verbal cues to stay close to walker and try to increase step length    Ambulation Distance (Feet)  460 Feet    Assistive device  Rollator    Gait Pattern  Step-through pattern;Decreased step length - right;Decreased step length - left    Ambulation Surface  Level;Indoor      Standardized Balance Assessment   Standardized Balance Assessment  Berg Balance Test      Berg Balance Test   Sit to Stand  Able to stand  independently using hands    Standing Unsupported  Able to stand safely 2 minutes    Sitting with Back Unsupported but Feet Supported on Floor or Stool  Able to sit safely and securely 2 minutes    Stand to Sit  Sits safely with minimal use of hands    Transfers  Able to transfer with verbal cueing and /or supervision    Standing  Unsupported with Eyes Closed  Able to stand 10 seconds with supervision    Standing Ubsupported with Feet Together  Able to place feet together independently and stand 1 minute safely    From Standing, Reach Forward with Outstretched Arm  Can reach confidently >25 cm (10")    From Standing Position, Pick up Object from Floor  Able to pick up shoe, needs supervision    From Standing Position, Turn to Look Behind Over each Shoulder  Turn sideways only but maintains balance    Turn 360 Degrees  Needs close supervision or verbal cueing    Standing Unsupported, Alternately Place Feet on Step/Stool  Able to complete >2 steps/needs minimal assist    Standing Unsupported, One Foot in Front  Able to take small step independently and hold 30 seconds    Standing on One Leg  Tries to lift leg/unable to hold 3 seconds but remains standing independently    Total Score  38      Neuro Re-ed    Neuro Re-ed Details   In // bars: gait without UE support 6' x 4, marching gait forwards and then backwards gait 6' x 4 without UE support. Verbal cues to try to increase hip extension with posterior step. Standing on rockerboard trying to maintain level 30 sec x 2 without UE support.  Walking over red mat without UE support 6' x 4.  Standing balance on red mat with feet together x 30 sec eyes open and x 30 sec eyes closed then staggered stance x 30 sec each position. Increased bouncing noted with staggered stance. Side stepping on red mat without UE support 6' x 4. CGA/ close SBA with all activities for safety.             PT Education - 01/09/20 1726    Education Details  Discussed possible recert vs d/c next visit based on what patient wants.    Person(s) Educated  Patient    Methods  Explanation    Comprehension  Verbalized understanding       PT Short Term Goals - 01/09/20 1550      PT SHORT TERM GOAL #1   Title  Patient will be independent with initial HEP in order to build upon functional gains made in  therapy.  ALL STGS DUE 01/11/20    Baseline  pt will benefit from on-going additions to  HEP    Time  4    Period  Weeks    Status  On-going    Target Date  01/11/20      PT SHORT TERM GOAL #2   Title  Patient to improve BERG balance test to at least 38/56 for decreased fall risk.    Baseline  35/56 on 12/13/19, 38/56 on Berg on 01/09/20    Time  4    Period  Weeks    Status  Achieved      PT SHORT TERM GOAL #3   Title  Patient will decrease TUG time with bariatric rollator to 24 seconds of less to demo decr fall risk.    Baseline  30.22 seconds on 10/31/19; 25.93 sec 12/11/2019    Time  4    Period  Weeks    Status  Revised      PT SHORT TERM GOAL #4   Title  Patient will improve gait velocity with bariatric rollator to at least 2.0 ft/sec to demo a limited community ambulator and decr fall risk.    Baseline  1.1 ft/sec with L AFO and bariatric rollator; 1.6 ft/sec 12/11/2019    Time  4    Period  Weeks    Status  Revised      PT SHORT TERM GOAL #5   Title  Patient will decrease 5x sit <> stand time to 25 seconds of less from elevated mat table with UE support in order to demo improved functional LE strength.    Baseline  31.06 seconds; 27.19 sec 12/11/2019    Time  4    Period  Weeks    Status  On-going        PT Long Term Goals - 11/20/19 1711      PT LONG TERM GOAL #1   Title  Patient will be independent with final HEP in order to build upon functional gains made in therapy. ALL LTGS DUE 01/29/20    Time  13   due to delay in scheduling   Period  Weeks    Status  New      PT LONG TERM GOAL #2   Title  Berg score to imporve to at least 40/56 for decreased fall risk.    Time  13    Period  Weeks    Status  Revised      PT LONG TERM GOAL #3   Title  Patient will decrease 5x sit <> stand time to 19 seconds of less from elevated mat table vs. standard chair with UE support in order to demo improved functional LE strength.    Time  13    Period  Weeks    Status  New       PT LONG TERM GOAL #4   Title  Patient will improve gait velocity with bariatric rollator vs. LRAD to at least 2.6 ft/sec to demo improved community mobility.    Time  13    Period  Weeks    Status  New      PT LONG TERM GOAL #5   Title  Patient will decrease TUG time with bariatric rollator vs. LRAD to 19 seconds of less to demo decr fall risk.    Time  13    Period  Weeks    Status  New      PT LONG TERM GOAL #6   Title  Patient will ambulate at least 300' with supervision over level indoor and outdoor  surfaces with LRAD  with supervision in order to improve community mobility.    Time  13    Period  Weeks    Status  New            Plan - 01/09/20 1727    Clinical Impression Statement  PT began checking STGs with patient meeting Berg goal with increase to 38/56. Still indicates high fall risk. Pt continued to need cuing for transfer safety especially when rising from rollator. Needs to position against something as brakes are not sufficient. Pt will benefit from continued PT to continue to address strength, balance and functional mobility defictis.    Personal Factors and Comorbidities  Comorbidity 3+;Past/Current Experience;Time since onset of injury/illness/exacerbation    Comorbidities  diabetes, HTN, OSA, morbid obesity.    Examination-Activity Limitations  Squat;Transfers;Locomotion Level;Stairs    Examination-Participation Restrictions  Community Activity    Stability/Clinical Decision Making  Evolving/Moderate complexity    Rehab Potential  Good    PT Frequency  2x / week    PT Duration  12 weeks    PT Treatment/Interventions  ADLs/Self Care Home Management;Balance training;Therapeutic exercise;Therapeutic activities;Functional mobility training;Stair training;Gait training;DME Instruction;Aquatic Therapy;Neuromuscular re-education;Patient/family education;Orthotic Fit/Training;Energy conservation    PT Next Visit Plan  Check remaininig STGs. Pt will need recert next  visit due to delay in returning but he states he may choose to discharge PT next visit. What is final decision? Continue strength, balance strategies, compliant surfaces, parallel bars to decrease support; use of aerobic machines for additional BLE strength    PT Home Exercise Plan  [id number] ([employer])    Consulted and Agree with Plan of Care  Patient       Patient will benefit from skilled therapeutic intervention in order to improve the following deficits and impairments:  Abnormal gait, Decreased activity tolerance, Decreased balance, Decreased coordination, Decreased range of motion, Decreased knowledge of use of DME, Decreased knowledge of precautions, Decreased endurance, Decreased strength, Difficulty walking, Postural dysfunction, Impaired sensation, Obesity, Pain  Visit Diagnosis: Muscle weakness (generalized)  Other abnormalities of gait and mobility     Problem List Patient Active Problem List   Diagnosis Date Noted  . Lower extremity edema 11/24/2019  . Difficulty urinating 11/24/2019  . Dupuytren's contracture of left hand 09/20/2019  . Hyperlipidemia 08/24/2019  . Upper and lower extremity pain 08/24/2019  . OSA on CPAP 08/09/2019  . Copious oral secretions 08/09/2019  . Anemia of chronic disease   . Dysphagia due to recent cerebral infarction   . Diabetes mellitus type 2 in obese ([facility])   . Morbid obesity ([facility])   . Status post insertion of percutaneous endoscopic gastrostomy (PEG) tube ([facility])   . History of CVA (cerebrovascular accident) 06/27/2019  . Essential hypertension 06/14/2019  . Renal insufficiency 05/30/2019  . Diplopia   . Chronic fatigue     Ronn Melena, PT, DPT, NCS 01/09/2020, 5:30 PM  [facility] [address] [place], [place], [address] Phone: [phone]   Fax:  [phone]  Name: Michael Bright MRN: [id number] Date of Birth: May 25, 1956

## 2020-01-09 NOTE — Therapy (Signed)
[facility] [address] [place], [place], [address] Phone: [phone]   Fax:  [phone]  Speech Language Pathology Treatment/Progress note  Patient Details  Name: Michael Bright MRN: [id number] Date of Birth: 1956/02/28 Referring Provider (SLP): Dr. Naaman Plummer   Encounter Date: 01/09/2020  End of Session - 01/09/20 2353    Visit Number  20    Number of Visits  25    Date for SLP Re-Evaluation  02/16/20    SLP Start Time  1450    SLP Stop Time   1527    SLP Time Calculation (min)  37 min    Activity Tolerance  Patient tolerated treatment well;Patient limited by fatigue   last 10 minutes      Past Medical History:  Diagnosis Date  . Acute CVA (cerebrovascular accident) ([place]) 05/30/2019  . Acute kidney injury ([place])   . Acute respiratory failure ([place])   . Aspiration pneumonia ([place]) 06/14/2019  . 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 Diabetes ([place])   . 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 (CVA) 08/09/2019  . Pneumonia    aspiration pneumonia  . Sepsis with acute renal failure without septic shock ([place])   . Septic shock ([place])   . Stroke ([place])   . Urinary tract infection   . Urinary tract infection without hematuria     Past Surgical History:  Procedure Laterality Date  . CHOLECYSTECTOMY    . IR GASTROSTOMY TUBE MOD SED  07/04/2019    There were no vitals filed for this visit.  Subjective Assessment - 01/09/20 1517    Subjective  Pt reports his reflux is less today    Currently in Pain?  No/denies            ADULT SLP TREATMENT - 01/09/20 1518      General Information   Behavior/Cognition  Alert;Pleasant mood;Cooperative      Treatment Provided   Treatment provided  Dysphagia      Cognitive-Linquistic Treatment   Skilled Treatment  NMES placement 3b was used with pt today and therapeutic intensity of 16.0 mA incr'd 10 minutes into session up to 17.67m.  Pt with  consistent audible swallows and after 12 minutes of near nectar pt's average swallow trigger on last 10 swallows was 3 seconds. Pt performed HEP with SLP with usual min A faded to SBA on EMST (full exhale/inhale). Pt with delay of up to 5 seconds by 9th rep of effortful swallow (set 1),  average 8 second delay by rep 7 on set 2, and delay up to 9 seconds at 5th rep of set 3, delay of 9 seconds at 7th rep of set 4 (each set 10 swallows). Last six swallows were completed with average 15 second delay.      Assessment / Recommendations / Plan   Plan  Continue with current plan of care         SLP Short Term Goals - 12/15/19 1510      SLP SHORT TERM GOAL #1   Title  Pt will complete dysphagia HEP with rare min A x 3 sessions.    Status  Partially Met      SLP SHORT TERM GOAL #2   Title  Patient will tolerate electrode placement for NMES x 3 sessions.    Baseline  12-05-19, 12-11-19    Status  Achieved      SLP SHORT TERM GOAL #3   Title  Patient will tell SLP 3 s/sx  of aspiration PNA.    Status  Achieved       SLP Long Term Goals - 01/09/20 2355      SLP LONG TERM GOAL #1   Title  Pt will demonstrate dysphagia HEP with modified independence x3 sessions.    Time  1    Period  Weeks    Status  On-going      SLP LONG TERM GOAL #2   Title  Pt will report thorough oral care prior to water/ice chips x6 sessions.    Baseline  11-29-19, 12-15-19 12-20-19, 12-28-19,    Time  1    Period  Weeks    Status  On-going      SLP LONG TERM GOAL #3   Title  pt will tolerate electrode placement for 7 therapy sessions    Baseline  12-11-19, 12-15-19, 12-19-19, 12-20-19, 12-28-19,01-02-20, 01-03-20    Status  Achieved      SLP LONG TERM GOAL #4   Title  pt will swallow within 5 seconds of bolus presentation to oral cavity, or in subsequent swallows at least 80% with at least 60 swallows    Time  1    Period  Weeks    Status  On-going       Plan - 01/09/20 2354    Clinical Impression Statement  Pt cont  entering ST room without hydrophonia present.  He continues PEG dependent since hospitalization. See "skilled treatment" for  more details. He reports continuing to take nectar trials at home and also sips of regular water in supine after oral care. Pt had trials of PO here in ST session due to reflux was subsiding, per pt report. Patient reports completing pharyngeal exercises and RMT (expiratory) with improvement in function noted on previous MBS (10-18-19).  I recommend cont'd skilled ST to address dysphagia for training in aspiration precautions, pharyngeal exercises, and  NMES to improve pt's swallow function for possible advancement to comfort feeds. Advise nectar thick liquid trials and (per MBSS) thin  liquids in supine as much as pt is comfortable.    Speech Therapy Frequency  3x / week   or 25 total visits   Duration  --   8 weeks or 17 visits   Treatment/Interventions  Aspiration precaution training;Cueing hierarchy;SLP instruction and feedback;Pharyngeal strengthening exercises;NMES;Compensatory techniques;Diet toleration management by SLP;Trials of upgraded texture/liquids;Patient/family education    Potential to Achieve Goals  Good    Potential Considerations  Severity of impairments    SLP Home Exercise Plan  Continue swallow ex and RMT per [facility] SLP; bring exercises to next session    Consulted and Agree with Plan of Care  Patient;Family member/caregiver       Patient will benefit from skilled therapeutic intervention in order to improve the following deficits and impairments:   Oropharyngeal dysphagia   Speech Therapy Progress Note  Dates of Reporting Period: 12-19-19 to present  Subjective Statement: Pt seen for 20 visits of skilled ST addressing his dysphagia.  Objective Measurements: Pt has not been able to complete PO swallows the last few sessions due to severe reflux reported. Fatigue plays a large factor in pt success.  Goal Update: See above. Pt will be renewed this  week.  Plan: Renew pt for four more weeks.  Reason Skilled Services are Required: He has not reached max rehab potential    Problem List Patient Active Problem List   Diagnosis Date Noted  . Lower extremity edema 11/24/2019  . Difficulty urinating 11/24/2019  .  Dupuytren's contracture of left hand 09/20/2019  . Hyperlipidemia 08/24/2019  . Upper and lower extremity pain 08/24/2019  . OSA on CPAP 08/09/2019  . Copious oral secretions 08/09/2019  . Anemia of chronic disease   . Dysphagia due to recent cerebral infarction   . Diabetes mellitus type 2 in obese ([place])   . Morbid obesity ([place])   . Status post insertion of percutaneous endoscopic gastrostomy (PEG) tube ([place])   . History of CVA (cerebrovascular accident) 06/27/2019  . Essential hypertension 06/14/2019  . Renal insufficiency 05/30/2019  . Diplopia   . Chronic fatigue     [facility] 01/09/2020, 11:56 PM  [facility] [address] [place], [place], [address] Phone: [phone]   Fax:  [phone]   Name: Fadil Macmaster MRN: [id number] Date of Birth: 12/05/1955

## 2020-01-11 ENCOUNTER — Ambulatory Visit: Payer: BC Managed Care – PPO

## 2020-01-11 ENCOUNTER — Other Ambulatory Visit: Payer: Self-pay

## 2020-01-11 ENCOUNTER — Ambulatory Visit: Payer: BC Managed Care – PPO | Admitting: Physical Therapy

## 2020-01-11 ENCOUNTER — Ambulatory Visit: Payer: BC Managed Care – PPO | Admitting: Occupational Therapy

## 2020-01-11 DIAGNOSIS — R208 Other disturbances of skin sensation: Secondary | ICD-10-CM

## 2020-01-11 DIAGNOSIS — R278 Other lack of coordination: Secondary | ICD-10-CM

## 2020-01-11 DIAGNOSIS — R2689 Other abnormalities of gait and mobility: Secondary | ICD-10-CM

## 2020-01-11 DIAGNOSIS — M6281 Muscle weakness (generalized): Secondary | ICD-10-CM

## 2020-01-11 DIAGNOSIS — R1312 Dysphagia, oropharyngeal phase: Secondary | ICD-10-CM | POA: Diagnosis not present

## 2020-01-11 DIAGNOSIS — R2681 Unsteadiness on feet: Secondary | ICD-10-CM

## 2020-01-11 NOTE — Therapy (Signed)
[facility] [address] [place], [place], [address] Phone: [phone]   Fax:  [phone]  Speech Language Pathology Treatment  Patient Details  Name: Michael Bright MRN: [id number] Date of Birth: 09-20-1956 Referring Provider (SLP): Dr. Naaman Plummer   Encounter Date: 01/11/2020  End of Session - 01/11/20 1614    Visit Number  21    Number of Visits  25    Date for SLP Re-Evaluation  02/16/20    SLP Start Time  1448    SLP Stop Time   1523    SLP Time Calculation (min)  35 min    Activity Tolerance  Patient tolerated treatment well;Patient limited by fatigue       Past Medical History:  Diagnosis Date  . Acute CVA (cerebrovascular accident) ([place]) 05/30/2019  . Acute kidney injury ([place])   . Acute respiratory failure ([place])   . Aspiration pneumonia ([place]) 06/14/2019  . 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 Diabetes ([place])   . 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 (CVA) 08/09/2019  . Pneumonia    aspiration pneumonia  . Sepsis with acute renal failure without septic shock ([place])   . Septic shock ([place])   . Stroke ([place])   . Urinary tract infection   . Urinary tract infection without hematuria     Past Surgical History:  Procedure Laterality Date  . CHOLECYSTECTOMY    . IR GASTROSTOMY TUBE MOD SED  07/04/2019    There were no vitals filed for this visit.  Subjective Assessment - 01/11/20 1610    Subjective  Pt reports his reflux is again less today.    Currently in Pain?  No/denies            ADULT SLP TREATMENT - 01/11/20 1610      General Information   Behavior/Cognition  Alert;Pleasant mood;Cooperative      Treatment Provided   Treatment provided  Dysphagia      Cognitive-Linquistic Treatment   Skilled Treatment  NMES placement 3a was used with pt today and therapeutic intensity of 17.5 mA incr'd from 12.69m 5 minutes into session. Pt with consistent audible swallows with  effortful swallow throughout session. After 10 minutes of near nectar consistency, pt's average swallow trigger time was 4 seconds. On last set of 5 effortful swallows (total=55) was average 14 seconds. Pt performed HEP with SLP with independence except on EMST (full exhale/inhale and don't move shoulders).       Assessment / Recommendations / Plan   Plan  Continue with current plan of care      Progression Toward Goals   Progression toward goals  Progressing toward goals         SLP Short Term Goals - 12/15/19 1510      SLP SHORT TERM GOAL #1   Title  Pt will complete dysphagia HEP with rare min A x 3 sessions.    Status  Partially Met      SLP SHORT TERM GOAL #2   Title  Patient will tolerate electrode placement for NMES x 3 sessions.    Baseline  12-05-19, 12-11-19    Status  Achieved      SLP SHORT TERM GOAL #3   Title  Patient will tell SLP 3 s/sx of aspiration PNA.    Status  Achieved       SLP Long Term Goals - 01/11/20 1615      SLP LONG TERM GOAL #1   Title  Pt  will demonstrate dysphagia HEP with modified independence x3 sessions.    Time  1    Period  Weeks    Status  On-going      SLP LONG TERM GOAL #2   Title  Pt will report thorough oral care prior to water/ice chips x6 sessions.    Baseline  11-29-19, 12-15-19 12-20-19, 12-28-19,    Time  1    Period  Weeks    Status  On-going      SLP LONG TERM GOAL #3   Title  pt will tolerate electrode placement for 7 therapy sessions    Baseline  12-11-19, 12-15-19, 12-19-19, 12-20-19, 12-28-19,01-02-20, 01-03-20    Status  Achieved      SLP LONG TERM GOAL #4   Title  pt will swallow within 5 seconds of bolus presentation to oral cavity, or in subsequent swallows at least 80% with at least 50 swallows    Time  1    Period  Weeks    Status  Revised       Plan - 01/11/20 1614    Clinical Impression Statement  Pt cont entering ST room without hydrophonia present.  He continues PEG dependent since hospitalization. See "skilled  treatment" for  more details. He reports continuing to take nectar trials at home and also sips of regular water in supine after oral care. Pt had trials of PO here in ST session due to reflux was subsiding, per pt report. Patient reports completing pharyngeal exercises and RMT (expiratory) with improvement in function noted on previous MBS (10-18-19).  I recommend cont'd skilled ST to address dysphagia for training in aspiration precautions, pharyngeal exercises, and  NMES to improve pt's swallow function for possible advancement to comfort feeds. Advise nectar thick liquid trials and (per MBSS) thin  liquids in supine as much as pt is comfortable.    Speech Therapy Frequency  3x / week   or 25 total visits   Duration  --   8 weeks or 17 visits   Treatment/Interventions  Aspiration precaution training;Cueing hierarchy;SLP instruction and feedback;Pharyngeal strengthening exercises;NMES;Compensatory techniques;Diet toleration management by SLP;Trials of upgraded texture/liquids;Patient/family education    Potential to Achieve Goals  Good    Potential Considerations  Severity of impairments    SLP Home Exercise Plan  Continue swallow ex and RMT per [facility] SLP; bring exercises to next session    Consulted and Agree with Plan of Care  Patient;Family member/caregiver       Patient will benefit from skilled therapeutic intervention in order to improve the following deficits and impairments:   Oropharyngeal dysphagia    Problem List Patient Active Problem List   Diagnosis Date Noted  . Lower extremity edema 11/24/2019  . Difficulty urinating 11/24/2019  . Dupuytren's contracture of left hand 09/20/2019  . Hyperlipidemia 08/24/2019  . Upper and lower extremity pain 08/24/2019  . OSA on CPAP 08/09/2019  . Copious oral secretions 08/09/2019  . Anemia of chronic disease   . Dysphagia due to recent cerebral infarction   . Diabetes mellitus type 2 in obese ([place])   . Morbid obesity ([place])   . Status  post insertion of percutaneous endoscopic gastrostomy (PEG) tube ([place])   . History of CVA (cerebrovascular accident) 06/27/2019  . Essential hypertension 06/14/2019  . Renal insufficiency 05/30/2019  . Diplopia   . Chronic fatigue     Renelda Kilian ,MS, CCC-SLP  01/11/2020, 4:15 PM  [facility] [address] [place],  [place], [address] Phone: [phone]   Fax:  [phone]   Name: Michael Bright MRN: [id number] Date of Birth: 05/17/1956

## 2020-01-11 NOTE — Therapy (Signed)
[facility] [address] [place], [place], [address] Phone: [phone]   Fax:  [phone]  Occupational Therapy Treatment  Patient Details  Name: Michael Bright MRN: [id number] Date of Birth: 11/13/1955 Referring Provider (OT): Dr. Naaman Plummer   Encounter Date: 01/11/2020        OCCUPATIONAL THERAPY DISCHARGE SUMMARY   Current functional level related to goals / functional outcomes: Pt made good overall progress see goals.   Remaining deficits: Decreased coordination, decreased balance   Education / Equipment: Pt was educated regarding HEP. He returned demonstration.  Plan: Patient agrees to discharge.  Patient goals were partially met. Patient is being discharged due to being pleased with the current functional level.  ?????                                  OT End of Session - 01/11/20 1540    Visit Number  16    Number of Visits  25    Date for OT Re-Evaluation  01/24/20    Authorization Type  [employer]    Authorization - Visit Number  16    Authorization - Number of Visits  20    Progress Note Due on Visit  20    OT Start Time  3546    OT Stop Time  1615    OT Time Calculation (min)  40 min    Activity Tolerance  Patient tolerated treatment well    Behavior During Therapy  WFL for tasks assessed/performed       Past Medical History:  Diagnosis Date  . Acute CVA (cerebrovascular accident) ([place]) 05/30/2019  . Acute kidney injury ([place])   . Acute respiratory failure ([place])   . Aspiration pneumonia ([place]) 06/14/2019  . 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 Diabetes ([place])   . 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 (CVA) 08/09/2019  . Pneumonia    aspiration pneumonia  . Sepsis with acute renal failure without septic shock ([place])   . Septic shock ([place])   . Stroke ([place])   . Urinary tract infection   . Urinary tract infection without hematuria      Past Surgical History:  Procedure Laterality Date  . CHOLECYSTECTOMY    . IR GASTROSTOMY TUBE MOD SED  07/04/2019    There were no vitals filed for this visit.  Subjective Assessment - 01/11/20 1639    Subjective   Pt agrees with palns for d/c    Patient is accompanied by:  Family member   wife   Pertinent History  Michael Bright is a 64 y.o. 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 He was readmitted on 07/20/19 with septic shock due to Klebsiella pneumoniae bacteremia with UTI and hypoxic respiratory failure bilateral lung infiltrates due to rhino virus. He was intubated briefly from 10/1-10/2 and treated with broad spectrum antibiotics till cultures finalized. Antibiotics narrowed to cefdinir on 10/5 with recommendations to complete 2 weeks total antibiotic course.  Hospital course significant for AKI with abnormal LFTs due to shocked liver, intermittent hypoxic episode as well as significant oral secretions with dysphonia. Discharged home 08/08/19.    Limitations  NPO, feeding tube    Patient Stated  Goals  improve coordination and walking    Currently in Pain?  No/denies    Pain Onset  More than a month ago             Treatment: Therapist checked progress towards goals. Pt made excellent overall progress, he agrees with d/c. Reviewed green theraband ex 15-20 reps each and coordination activities: flipping/ dealing cards, stacking coins., min difficulty. Arm bike x 8 mins level 4 for conditioning.                       OT Short Term Goals - 01/11/20 1551      OT SHORT TERM GOAL #1   Title  I with HEP.    Time  4    Period  Weeks    Status  Achieved    Target Date  11/25/19       OT SHORT TERM GOAL #2   Title  Pt will demonstrate improved fine motor coordination for ADLs as evidenced by decreasing 9 hole peg test score to 50 secs or less    Baseline  RUE 35.41, LUE 57.78    Time  4    Period  Weeks    Status  Achieved   47.31     OT SHORT TERM GOAL #3   Title  Pt will demonstrate aiblity to retrievee light weight object at 110* shoulder flexion with LUE.    Time  4    Period  Weeks    Status  Achieved   >110     OT SHORT TERM GOAL #4   Title  Pt will perfrom light home management/ basic cooking with supervision.    Time  4    Period  Weeks    Status  Partially Met   Pt performs light home management, not cooking as pt can 't eat yet       OT Long Term Goals - 01/11/20 1548      OT LONG TERM GOAL #1   Title  I with updated HEP.- 01/24/20    Time  12    Period  Weeks    Status  Achieved   green theraband     OT LONG TERM GOAL #2   Title  Pt will demonstrate improved LUE fine motor coordination for ADLS as evidenced by decreasing 9 hole peg test score to 42 secs or less.    Time  12    Period  Weeks    Status  Not Met   47.85     OT LONG TERM GOAL #3   Title  Pt will retrieve a lightweight object at 120 shoulder flexion with LUE.    Time  12    Period  Weeks    Status  Achieved   130     OT LONG TERM GOAL #4   Title  Pt will perform mod complex cooking/ home management modified independently    Time  12    Period  Weeks    Status  Partially Met   performing laundry, washing dishes, not cooking as pt is NPO     OT LONG TERM GOAL #5   Title  Pt will perfrom all basic ADLS modified independently.    Time  12    Period  Weeks    Status  Achieved   per pt report for BADLS           Plan - 01/11/20 1641    Clinical  Impression Statement  Pt made excellent progress. He agrees with plans to d/c from OT.    Occupational performance deficits (Please refer to evaluation for details):  ADL's;IADL's;Leisure;Social Participation    Body  Structure / Function / Physical Skills  ADL;UE functional use;Flexibility;Pain;FMC;ROM;Coordination;GMC;Sensation;Decreased knowledge of precautions;Decreased knowledge of use of DME;IADL;Dexterity;Strength;Mobility;Endurance;Balance;Vision    Rehab Potential  Good    OT Frequency  2x / week    OT Duration  12 weeks    OT Treatment/Interventions  Self-care/ADL training;Aquatic Therapy;DME and/or AE instruction;Patient/family education;Balance training;Passive range of motion;Gait Training;Paraffin;Cryotherapy;Fluidtherapy;Splinting;Therapist, nutritional;Contrast Bath;Electrical Stimulation;Moist Heat;Therapeutic exercise;Manual Therapy;Therapeutic activities;Neuromuscular education    Plan  d/c OT    Consulted and Agree with Plan of Care  Patient       Patient will benefit from skilled therapeutic intervention in order to improve the following deficits and impairments:   Body Structure / Function / Physical Skills: ADL, UE functional use, Flexibility, Pain, FMC, ROM, Coordination, GMC, Sensation, Decreased knowledge of precautions, Decreased knowledge of use of DME, IADL, Dexterity, Strength, Mobility, Endurance, Balance, Vision       Visit Diagnosis: Muscle weakness (generalized)  Other abnormalities of gait and mobility  Other lack of coordination  Other disturbances of skin sensation    Problem List Patient Active Problem List   Diagnosis Date Noted  . Lower extremity edema 11/24/2019  . Difficulty urinating 11/24/2019  . Dupuytren's contracture of left hand 09/20/2019  . Hyperlipidemia 08/24/2019  . Upper and lower extremity pain 08/24/2019  . OSA on CPAP 08/09/2019  . Copious oral secretions 08/09/2019  . Anemia of chronic disease   . Dysphagia due to recent cerebral infarction   . Diabetes mellitus type 2 in obese ([place])   . Morbid obesity ([place])   . Status post insertion of percutaneous endoscopic gastrostomy (PEG) tube ([place])   . History of CVA (cerebrovascular  accident) 06/27/2019  . Essential hypertension 06/14/2019  . Renal insufficiency 05/30/2019  . Diplopia   . Chronic fatigue     Ledell Codrington 01/11/2020, 4:45 PM  [facility] [address] [place], [place], [address] Phone: [phone]   Fax:  [phone]  Name: Oaklee Sunga MRN: [id number] Date of Birth: [birth year]

## 2020-01-12 NOTE — Patient Instructions (Signed)
Access Code: [email or web address] URL: [email or web address] Date: 01/12/2020 Prepared by: Lonia Blood  Program Notes Provided patient handout from file cabinet with forward STEP TAPS, x 10, Side STEP TAPs x 10, Back STEP Taps x 10 (handout is of kicks, but performed with step taps at counter)   Exercises Sit to Stand - 2 x daily - 5 x weekly - 10 reps - 1 sets Seated Toe Raise - 1 x daily - 7 x weekly - 10 reps - 2 sets - 3 sec hold Quarter Squat with Table - 2 x daily - 5 x weekly - 10 reps - 1 sets Heel rises with counter support - 2 x daily - 7 x weekly - 10 reps - 2 sets Standing March with Counter Support - 1 x daily - 7 x weekly - 10 reps - 2 sets Standing Romberg to 1/2 Tandem Stance - 1 x daily - 5 x weekly - 3 reps - 1 sets - 10 sec hold Single Leg Stance with Support - 1 x daily - 5 x weekly - 3 reps - 1 sets - 10 sec hold

## 2020-01-12 NOTE — Therapy (Signed)
[facility] [address] [place], [place], [address] Phone: [phone]   Fax:  [phone]  Physical Therapy Treatment  Patient Details  Name: Michael Bright MRN: [id number] Date of Birth: 1956-08-12 Referring Provider (PT): Alger Simons, MD   Encounter Date: 01/11/2020  PT End of Session - 01/12/20 1546    Visit Number  15    Number of Visits  31   per recert 06/05/2992   Date for PT Re-Evaluation  71/69/67   90 day recert for 60 day POC   Authorization Type  BCBS - 2021 primary, Medicare Secondary    PT Start Time  1402    PT Stop Time  1445    PT Time Calculation (min)  43 min    Equipment Utilized During Treatment  Gait belt    Activity Tolerance  Patient tolerated treatment well    Behavior During Therapy  [facility] for tasks assessed/performed       Past Medical History:  Diagnosis Date  . Acute CVA (cerebrovascular accident) ([place]) 05/30/2019  . Acute kidney injury ([place])   . Acute respiratory failure ([place])   . Aspiration pneumonia ([place]) 06/14/2019  . 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 Diabetes ([place])   . 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 (CVA) 08/09/2019  . Pneumonia    aspiration pneumonia  . Sepsis with acute renal failure without septic shock ([place])   . Septic shock ([place])   . Stroke ([place])   . Urinary tract infection   . Urinary tract infection without hematuria     Past Surgical History:  Procedure Laterality Date  . CHOLECYSTECTOMY    . IR GASTROSTOMY TUBE MOD SED  07/04/2019    There were no vitals filed for this visit.  Subjective Assessment - 01/12/20 1530    Subjective  Feel like I'm doing okay, but the main reason I was thinking of stopping was because I couldn't get anything but early appointments until mid-April.    Pertinent History  PMH: CVA (L lateral medullary syndrome) diabetes, HTN, OSA, morbid obesity.    Limitations  Standing;Walking;House hold  activities    How long can you stand comfortably?  can stand approx. 10 minutes    How long can you walk comfortably?  20 minutes with the rollator    Patient Stated Goals  wants to walk better, walk more steady, wants to be able to get up more easily.    Currently in Pain?  No/denies    Pain Onset  More than a month ago    Pain Onset  More than a month ago                       [facility] Adult PT Treatment/Exercise - 01/12/20 0001      Transfers   Transfers  Sit to Stand;Stand to Sit    Sit to Stand  5: Supervision;With upper extremity assist;From bed   20" mat surface   Five time sit to stand comments   26.32   from 20" mat surface   Stand to Sit  5: Supervision;With upper extremity assist;To bed      Ambulation/Gait   Ambulation/Gait  Yes    Ambulation/Gait Assistance  5: Supervision    Assistive device  Rollator    Gait Pattern  Step-through pattern;Decreased step length - right;Decreased step length - left    Ambulation Surface  Level;Indoor    Gait velocity  2.54 ft/sec  Timed Up and Go Test   TUG  Normal TUG    Normal TUG (seconds)  18.97       Reviewed HEP:    Access Code: [email or web address] URL: [email or web address] Date: 01/12/2020 Prepared by: Mady Haagensen  Program Notes Provided patient handout from file cabinet with forward STEP TAPS, x 10, Side STEP TAPs x 10, Back STEP Taps x 10 (handout is of kicks, but performed with step taps at counter) (performed these at counter with BUE support today and added as HEP)   Exercises Sit to Stand - 2 x daily - 5 x weekly - 10 reps - 1 sets-verbally reviewed Seated Toe Raise - 1 x daily - 7 x weekly - 10 reps - 2 sets - 3 sec hold-verbally reviewed  Pt return demo the following: [place] with Table - 2 x daily - 5 x weekly - 10 reps - 1 sets-pt performs 2 sets at counter Heel rises with counter support - 2 x daily - 7 x weekly - 10 reps - 2 sets Standing March with Counter Support - 1 x  daily - 7 x weekly - 10 reps - 2 sets Standing Romberg to 1/2 Tandem Stance - 1 x daily - 5 x weekly - 3 reps - 1 sets - 10 sec hold-progressing BUE support>1 UE support at counter. Single Leg Stance with Support - 1 x daily - 5 x weekly - 3 reps - 1 sets - 10 sec hold, BUE support     Self Care:  Lengthy discussion of POC, including pt's progress towards therapy goals and benefits of continueing PT to maximize functional gains.  Discussed continueing his HEP until his next scheduled PT appt (mid-April).  Disucssed pt still being at fall risk and needing rollator for stability.  Pt verbalizes understanding. PT Education - 01/12/20 1544    Education Details  Review of HEP (standing exercises to 2-3 sets of 10 reps); discussed POC and pt in agreement to continue (knowing he may have several weeks without PT due to clinic scheduling)    Person(s) Educated  Patient    Methods  Explanation    Comprehension  Verbalized understanding       PT Short Term Goals - 01/11/20 1417      PT SHORT TERM GOAL #1   Title  Patient will be independent with initial HEP in order to build upon functional gains made in therapy.  ALL STGS DUE 01/11/20    Baseline  --    Time  4    Period  Weeks    Status  Achieved    Target Date  01/11/20      PT SHORT TERM GOAL #2   Title  Patient to improve BERG balance test to at least 38/56 for decreased fall risk.    Baseline  35/56 on 12/13/19, 38/56 on Berg on 01/09/20    Time  4    Period  Weeks    Status  Achieved      PT SHORT TERM GOAL #3   Title  Patient will decrease TUG time with bariatric rollator to 24 seconds of less to demo decr fall risk.    Baseline  30.22 seconds on 10/31/19; 25.93 sec 12/11/2019; 18.97 sec with rollator 01/11/2020    Time  4    Period  Weeks    Status  Achieved      PT SHORT TERM GOAL #4   Title  Patient will improve gait velocity with bariatric rollator  to at least 2.0 ft/sec to demo a limited community ambulator and decr fall risk.     Baseline  1.1 ft/sec with L AFO and bariatric rollator; 1.6 ft/sec 12/11/2019; 2.54 f/tsec 01/11/2020    Time  4    Period  Weeks    Status  Achieved      PT SHORT TERM GOAL #5   Title  Patient will decrease 5x sit <> stand time to 25 seconds of less from elevated mat table with UE support in order to demo improved functional LE strength.    Baseline  31.06 seconds; 27.19 sec 12/11/2019; 26.32 sec 01/11/2020 (from 20" mat surface)    Time  4    Period  Weeks    Status  Not Met        PT Long Term Goals - 11/20/19 1711      PT LONG TERM GOAL #1   Title  Patient will be independent with final HEP in order to build upon functional gains made in therapy. ALL LTGS DUE 01/29/20    Time  13   due to delay in scheduling   Period  Weeks    Status  New      PT LONG TERM GOAL #2   Title  Berg score to imporve to at least 40/56 for decreased fall risk.    Time  13    Period  Weeks    Status  Revised      PT LONG TERM GOAL #3   Title  Patient will decrease 5x sit <> stand time to 19 seconds of less from elevated mat table vs. standard chair with UE support in order to demo improved functional LE strength.    Time  13    Period  Weeks    Status  New      PT LONG TERM GOAL #4   Title  Patient will improve gait velocity with bariatric rollator vs. LRAD to at least 2.6 ft/sec to demo improved community mobility.    Time  13    Period  Weeks    Status  New      PT LONG TERM GOAL #5   Title  Patient will decrease TUG time with bariatric rollator vs. LRAD to 19 seconds of less to demo decr fall risk.    Time  13    Period  Weeks    Status  New      PT LONG TERM GOAL #6   Title  Patient will ambulate at least 300' with supervision over level indoor and outdoor surfaces with LRAD  with supervision in order to improve community mobility.    Time  13    Period  Weeks    Status  New            Plan - 01/12/20 1546    Clinical Impression Statement  Completed assessing pt's STGs this  visit, with pt meeting 4 of 5 STGs.  He hsa met STG 1 for HEP, 2 for improved Berg, STG 3 for improved TUG score, STG 4 for improved gait velocity.  STG 5 not met for 5x sit<>stand, though time improved, but not to goal level.  While paitent has made significant progress during course of therapy, he remains at fall risk per TUG, Berg scores.  He would continue to benefit from further skilled PT, as he is making continued gains.  Pt in agreement that he is making progress and would like to continue.  (However,  due to clinic scheduling/availability, he will have several weeks off until he gets back into PT-and he is okay to work at home on HEP during this time).  Pt will continue to benefit from skilled physical therapy towards improve functional mobility and decreased fall risk.    Personal Factors and Comorbidities  Comorbidity 3+;Past/Current Experience;Time since onset of injury/illness/exacerbation    Comorbidities  diabetes, HTN, OSA, morbid obesity.    Examination-Activity Limitations  Squat;Transfers;Locomotion Level;Stairs    Examination-Participation Restrictions  Community Activity    Stability/Clinical Decision Making  Evolving/Moderate complexity    Rehab Potential  Good    PT Frequency  2x / week    PT Duration  8 weeks   per recert 04/15/3150   PT Treatment/Interventions  ADLs/Self Care Home Management;Balance training;Therapeutic exercise;Therapeutic activities;Functional mobility training;Stair training;Gait training;DME Instruction;Aquatic Therapy;Neuromuscular re-education;Patient/family education;Orthotic Fit/Training;Energy conservation    PT Next Visit Plan  Recert completed today, but pt will return mid-April due to scheduling conflicts.  Review updates to HEP given this visit; continue strength, balance strategies, compliant surfaces, use of aerobic machines and parallel bars for strength/balance    PT Home Exercise Plan  [id number] (MedBridge)    Consulted and Agree with Plan of Care   Patient       Patient will benefit from skilled therapeutic intervention in order to improve the following deficits and impairments:  Abnormal gait, Decreased activity tolerance, Decreased balance, Decreased coordination, Decreased range of motion, Decreased knowledge of use of DME, Decreased knowledge of precautions, Decreased endurance, Decreased strength, Difficulty walking, Postural dysfunction, Impaired sensation, Obesity, Pain  Visit Diagnosis: Unsteadiness on feet  Other abnormalities of gait and mobility  Muscle weakness (generalized)     Problem List Patient Active Problem List   Diagnosis Date Noted  . Lower extremity edema 11/24/2019  . Difficulty urinating 11/24/2019  . Dupuytren's contracture of left hand 09/20/2019  . Hyperlipidemia 08/24/2019  . Upper and lower extremity pain 08/24/2019  . OSA on CPAP 08/09/2019  . Copious oral secretions 08/09/2019  . Anemia of chronic disease   . Dysphagia due to recent cerebral infarction   . Diabetes mellitus type 2 in obese ([place])   . Morbid obesity ([place])   . Status post insertion of percutaneous endoscopic gastrostomy (PEG) tube ([place])   . History of CVA (cerebrovascular accident) 06/27/2019  . Essential hypertension 06/14/2019  . Renal insufficiency 05/30/2019  . Diplopia   . Chronic fatigue     Jaxon Mynhier W. 01/12/2020, 3:56 PM Frazier Butt., PT  [facility] [address] [place], [place], [address] Phone: [phone]   Fax:  [phone]  Name: Michael Bright MRN: [id number] Date of Birth: August 11, 1956  New goals for recert:  PT Short Term Goals - 01/12/20 1600      PT SHORT TERM GOAL #1   Title  Patient will be independent with progression of HEP in order to build upon functional gains made in therapy.  ALL STGS DUE 02/16/2020 (4 weeks-may be delayed due to scheduling)    Time  4    Period  Weeks    Status  Revised      PT SHORT TERM GOAL #2    Title  Patient to improve BERG balance test to at least 42/56 for decreased fall risk.    Baseline  35/56 on 12/13/19, 38/56 on Berg on 01/09/20; 38/56 01/11/2020    Time  4    Period  Weeks    Status  Revised  PT SHORT TERM GOAL #3   Title  Patient will decrease TUG time with bariatric rollator to 15 seconds of less to demo decr fall risk.    Baseline  30.22 seconds on 10/31/19; 25.93 sec 12/11/2019; 18.97 sec with rollator 01/11/2020    Time  4    Period  Weeks    Status  Revised      PT SHORT TERM GOAL #4   Title  Patient will improve gait velocity with bariatric rollator to at least 2.62 ft/sec to demo a limited community ambulator and decr fall risk.    Baseline  1.1 ft/sec with L AFO and bariatric rollator; 1.6 ft/sec 12/11/2019; 2.54 f/tsec 01/11/2020    Time  4    Period  Weeks    Status  Revised      PT SHORT TERM GOAL #5   Title  Patient will decrease 5x sit <> stand time to 23 seconds or less from  mat table with UE support in order to demo improved functional LE strength.    Baseline  31.06 seconds; 27.19 sec 12/11/2019; 26.32 sec 01/11/2020 (from 20" mat surface)    Time  4    Period  Weeks    Status  Revised      PT Long Term Goals - 01/12/20 8101      PT LONG TERM GOAL #1   Title  Patient will be independent with final HEP in order to build upon functional gains made in therapy. ALL LTGS DUE 03/15/2020 (8 weeks-may be delayed due to scheduling)    Time  8    Period  Weeks    Status  Revised      PT LONG TERM GOAL #2   Title  Berg score to imporve to at least 45/56 for decreased fall risk.    Time  8    Period  Weeks    Status  Revised      PT LONG TERM GOAL #3   Title  Patient will decrease 5x sit <> stand time to 19 seconds of less from elevated mat table vs. standard chair with UE support in order to demo improved functional LE strength.    Time  8    Period  Weeks    Status  On-going      PT LONG TERM GOAL #4   Title  Patient will improve gait velocity  with least restrictive assistive device to at least 2.6 ft/sec to demo improved community mobility.    Time  8    Period  Weeks    Status  Revised      PT LONG TERM GOAL #5   Title  Patient will decrease TUG time with least restrictive assistive device to 19 seconds of less to demo decr fall risk.    Time  8    Period  Weeks    Status  Revised      PT LONG TERM GOAL #6   Title  Patient will ambulate at least 300' with supervision over level indoor and outdoor surfaces with LRAD  with supervision in order to improve community mobility.    Time  8    Period  Weeks    Status  On-going      Mady Haagensen, [place] 01/12/20 4:05 PM Phone: [phone] Fax: [phone]

## 2020-01-13 ENCOUNTER — Ambulatory Visit: Payer: BC Managed Care – PPO | Attending: Internal Medicine

## 2020-01-13 DIAGNOSIS — Z23 Encounter for immunization: Secondary | ICD-10-CM

## 2020-01-13 NOTE — Progress Notes (Signed)
   Covid-19 Vaccination Clinic  Name:  Dre Gamino    MRN: [id number] DOB: 1955-11-15  01/13/2020  Mr. Mergenthaler was observed post Covid-19 immunization for 15 minutes without incident. He was provided with Vaccine Information Sheet and instruction to access the V-Safe system.   Mr. Bunda was instructed to call 911 with any severe reactions post vaccine: Marland Kitchen Difficulty breathing  . Swelling of face and throat  . A fast heartbeat  . A bad rash all over body  . Dizziness and weakness   Immunizations Administered    Name Date Dose VIS Date Route   Pfizer COVID-19 Vaccine 01/13/2020  9:27 AM 0.3 mL 09/29/2019 Intramuscular   Manufacturer: [employer], [employer]   Lot: [id number]   NDC: [address]-5

## 2020-01-15 ENCOUNTER — Ambulatory Visit: Payer: BC Managed Care – PPO

## 2020-01-15 ENCOUNTER — Other Ambulatory Visit: Payer: Self-pay

## 2020-01-15 DIAGNOSIS — R1312 Dysphagia, oropharyngeal phase: Secondary | ICD-10-CM

## 2020-01-15 NOTE — Therapy (Signed)
[facility] [address] [place], [place], [address] Phone: [phone]   Fax:  [phone]  Speech Language Pathology Treatment  Patient Details  Name: Michael Bright MRN: [id number] Date of Birth: 1956/07/08 Referring Provider (SLP): Dr. Naaman Plummer   Encounter Date: 01/15/2020  End of Session - 01/15/20 1650    Visit Number  22    Number of Visits  25    Date for SLP Re-Evaluation  02/16/20    SLP Start Time  6767    SLP Stop Time   1614    SLP Time Calculation (min)  41 min    Activity Tolerance  Patient tolerated treatment well       Past Medical History:  Diagnosis Date  . Acute CVA (cerebrovascular accident) ([place]) 05/30/2019  . Acute kidney injury ([place])   . Acute respiratory failure ([place])   . Aspiration pneumonia ([place]) 06/14/2019  . 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 Diabetes ([place])   . 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 (CVA) 08/09/2019  . Pneumonia    aspiration pneumonia  . Sepsis with acute renal failure without septic shock ([place])   . Septic shock ([place])   . Stroke ([place])   . Urinary tract infection   . Urinary tract infection without hematuria     Past Surgical History:  Procedure Laterality Date  . CHOLECYSTECTOMY    . IR GASTROSTOMY TUBE MOD SED  07/04/2019    There were no vitals filed for this visit.  Subjective Assessment - 01/15/20 1646    Subjective  Pt asks SLP about creamed soups - SLP OKs if they are nectar consistency.    Currently in Pain?  No/denies            ADULT SLP TREATMENT - 01/15/20 1646      General Information   Behavior/Cognition  Alert;Cooperative;Pleasant mood      Treatment Provided   Treatment provided  Dysphagia      Cognitive-Linquistic Treatment   Skilled Treatment  NMES placement 3b with mA= 18.0. Pt with consistent audible swallows with effortful swallow and PO swallows throughout session. Pt's average trigger time of  swallow was 3-4 seconds after 10 minutes of near-nectar consistency shake. Pt's trigger time of swallow incr'd from WNL to average 5.5 seconds by last set of 5 effortful swallows (53 total). Pt req'd occasional min A for full breath with EMST. Other exercises completed WNL procedure.       Assessment / Recommendations / Plan   Plan  Continue with current plan of care      Progression Toward Goals   Progression toward goals  Progressing toward goals         SLP Short Term Goals - 12/15/19 1510      SLP SHORT TERM GOAL #1   Title  Pt will complete dysphagia HEP with rare min A x 3 sessions.    Status  Partially Met      SLP SHORT TERM GOAL #2   Title  Patient will tolerate electrode placement for NMES x 3 sessions.    Baseline  12-05-19, 12-11-19    Status  Achieved      SLP SHORT TERM GOAL #3   Title  Patient will tell SLP 3 s/sx of aspiration PNA.    Status  Achieved       SLP Long Term Goals - 01/15/20 1651      SLP LONG TERM GOAL #1   Title  Pt  will demonstrate dysphagia HEP with modified independence x3 sessions.    Time  1    Period  Weeks    Status  On-going      SLP LONG TERM GOAL #2   Title  Pt will report thorough oral care prior to water/ice chips x6 sessions.    Baseline  11-29-19, 12-15-19 12-20-19, 12-28-19,    Time  1    Period  Weeks    Status  On-going      SLP LONG TERM GOAL #3   Title  pt will tolerate electrode placement for 7 therapy sessions    Baseline  12-11-19, 12-15-19, 12-19-19, 12-20-19, 12-28-19,01-02-20, 01-03-20    Status  Achieved      SLP LONG TERM GOAL #4   Title  pt will swallow within 5 seconds of bolus presentation to oral cavity, or in subsequent swallows at least 80% with at least 50 swallows    Time  1    Period  Weeks    Status  Revised       Plan - 01/15/20 1651    Clinical Impression Statement  Pt cont entering ST room without hydrophonia present.  He continues PEG dependent since hospitalization. See "skilled treatment" for  more  details. He reports continuing to take nectar trials at home and also sips of regular water in supine after oral care. Pt had trials of PO here in ST session due to reflux was subsiding, per pt report. Patient reports completing pharyngeal exercises and RMT (expiratory) with improvement in function noted on previous MBS (10-18-19).  I recommend cont'd skilled ST to address dysphagia for training in aspiration precautions, pharyngeal exercises, and  NMES to improve pt's swallow function for possible advancement to comfort feeds. Advise nectar thick liquid trials and (per MBSS) thin  liquids in supine as much as pt is comfortable.    Speech Therapy Frequency  3x / week   or 25 total visits   Duration  --   8 weeks or 17 visits   Treatment/Interventions  Aspiration precaution training;Cueing hierarchy;SLP instruction and feedback;Pharyngeal strengthening exercises;NMES;Compensatory techniques;Diet toleration management by SLP;Trials of upgraded texture/liquids;Patient/family education    Potential to Achieve Goals  Good    Potential Considerations  Severity of impairments    SLP Home Exercise Plan  Continue swallow ex and RMT per [facility] SLP; bring exercises to next session    Consulted and Agree with Plan of Care  Patient;Family member/caregiver       Patient will benefit from skilled therapeutic intervention in order to improve the following deficits and impairments:   Oropharyngeal dysphagia    Problem List Patient Active Problem List   Diagnosis Date Noted  . Lower extremity edema 11/24/2019  . Difficulty urinating 11/24/2019  . Dupuytren's contracture of left hand 09/20/2019  . Hyperlipidemia 08/24/2019  . Upper and lower extremity pain 08/24/2019  . OSA on CPAP 08/09/2019  . Copious oral secretions 08/09/2019  . Anemia of chronic disease   . Dysphagia due to recent cerebral infarction   . Diabetes mellitus type 2 in obese ([place])   . Morbid obesity ([place])   . Status post insertion of  percutaneous endoscopic gastrostomy (PEG) tube ([place])   . History of CVA (cerebrovascular accident) 06/27/2019  . Essential hypertension 06/14/2019  . Renal insufficiency 05/30/2019  . Diplopia   . Chronic fatigue     Alaja Goldinger ,MS, CCC-SLP  01/15/2020, 4:52 PM  [facility] [address] [place],  [place], [address] Phone: [phone]   Fax:  [phone]   Name: Havish Petties MRN: [id number] Date of Birth: 05/17/1956

## 2020-01-16 ENCOUNTER — Ambulatory Visit: Payer: BC Managed Care – PPO

## 2020-01-16 ENCOUNTER — Telehealth: Payer: Self-pay | Admitting: Primary Care

## 2020-01-16 DIAGNOSIS — R1312 Dysphagia, oropharyngeal phase: Secondary | ICD-10-CM

## 2020-01-16 DIAGNOSIS — K219 Gastro-esophageal reflux disease without esophagitis: Secondary | ICD-10-CM

## 2020-01-16 NOTE — Telephone Encounter (Signed)
Spoken to patient's wife and she stated that yes, patient is taking pantoprazole for reflux. He is having breakthrough reflux daily. It has been going on for a while now, long before he was seen in February.

## 2020-01-16 NOTE — Telephone Encounter (Signed)
Please advise 

## 2020-01-16 NOTE — Telephone Encounter (Signed)
Is he still taking the pantoprazole medication daily for reflux?  If so, then how often is he having breakthrough reflux? Once weekly, daily? How long has that been going on?

## 2020-01-16 NOTE — Therapy (Signed)
[facility] [address] [place], [place], [address] Phone: [phone]   Fax:  [phone]  Speech Language Pathology Treatment  Patient Details  Name: Michael Bright MRN: [id number] Date of Birth: 11-09-1955 Referring Provider (SLP): Dr. Naaman Plummer   Encounter Date: 01/16/2020  End of Session - 01/16/20 1640    Visit Number  23    Number of Visits  25    Date for SLP Re-Evaluation  02/16/20    SLP Start Time  1450    SLP Stop Time   1528    SLP Time Calculation (min)  38 min    Activity Tolerance  Patient tolerated treatment well       Past Medical History:  Diagnosis Date  . Acute CVA (cerebrovascular accident) ([facility] [place]) 05/30/2019  . Acute kidney injury ([place])   . Acute respiratory failure ([facility])   . Aspiration pneumonia ([place]) 06/14/2019  . 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 Diabetes ([facility])   . 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 (CVA) 08/09/2019  . Pneumonia    aspiration pneumonia  . Sepsis with acute renal failure without septic shock ([place])   . Septic shock ([place])   . Stroke ([facility])   . Urinary tract infection   . Urinary tract infection without hematuria     Past Surgical History:  Procedure Laterality Date  . CHOLECYSTECTOMY    . IR GASTROSTOMY TUBE MOD SED  07/04/2019    There were no vitals filed for this visit.  Subjective Assessment - 01/16/20 1636    Currently in Pain?  No/denies            ADULT SLP TREATMENT - 01/16/20 1636      General Information   Behavior/Cognition  Alert;Cooperative;Pleasant mood      Treatment Provided   Treatment provided  Dysphagia      Cognitive-Linquistic Treatment   Skilled Treatment  NMES placement 3a with mA= 17.5. Pt with consistent audible swallows with effortful swallow, and with PO swallows of near-nectar throughout session. Pt's average trigger time of swallow with PO swallow was average 2-3 seconds. Pt's trigger time of  effortful swallow without PO incr'd from WNL to average 6.25 seconds by last set of 10 effortful swallows (60 total). Pt req'd occasional min A for full breath with EMST, and rare min A for short quick exhale ("birthday candles"). Other exercises completed WNL procedure. Pt reproted his reflux is better than it was lst week with new food formula however is still having reflux even wiht new food.       Assessment / Recommendations / Plan   Plan  Continue with current plan of care      Progression Toward Goals   Progression toward goals  Progressing toward goals         SLP Short Term Goals - 12/15/19 1510      SLP SHORT TERM GOAL #1   Title  Pt will complete dysphagia HEP with rare min A x 3 sessions.    Status  Partially Met      SLP SHORT TERM GOAL #2   Title  Patient will tolerate electrode placement for NMES x 3 sessions.    Baseline  12-05-19, 12-11-19    Status  Achieved      SLP SHORT TERM GOAL #3   Title  Patient will tell SLP 3 s/sx of aspiration PNA.    Status  Achieved       SLP Long Term Goals -  01/16/20 1641      SLP LONG TERM GOAL #1   Title  Pt will demonstrate dysphagia HEP with modified independence x3 sessions.    Time  1    Period  Weeks    Status  On-going      SLP LONG TERM GOAL #2   Title  Pt will report thorough oral care prior to water/ice chips x6 sessions.    Baseline  11-29-19, 12-15-19 12-20-19, 12-28-19,    Time  1    Period  Weeks    Status  On-going      SLP LONG TERM GOAL #3   Title  pt will tolerate electrode placement for 7 therapy sessions    Baseline  12-11-19, 12-15-19, 12-19-19, 12-20-19, 12-28-19,01-02-20, 01-03-20    Status  Achieved      SLP LONG TERM GOAL #4   Title  pt will swallow within 5 seconds of bolus presentation to oral cavity, or in subsequent swallows at least 80% with at least 50 swallows    Time  1    Period  Weeks    Status  Revised       Plan - 01/16/20 1640    Clinical Impression Statement  Pt cont entering ST room  without hydrophonia present.  He continues PEG dependent since hospitalization. See "skilled treatment" for  more details. He reports continuing to take nectar trials at home and also sips of regular water in supine after oral care. Pt had trials of PO here in ST session due to reflux was subsiding, per pt report. Patient reports completing pharyngeal exercises and RMT (expiratory) with improvement in function noted on previous MBS (10-18-19).  I recommend cont'd skilled ST to address dysphagia for training in aspiration precautions, pharyngeal exercises, and  NMES to improve pt's swallow function for possible advancement to comfort feeds. Advise nectar thick liquid trials and (per MBSS) thin  liquids in supine as much as pt is comfortable.    Speech Therapy Frequency  3x / week   or 25 total visits   Duration  --   8 weeks or 17 visits   Treatment/Interventions  Aspiration precaution training;Cueing hierarchy;SLP instruction and feedback;Pharyngeal strengthening exercises;NMES;Compensatory techniques;Diet toleration management by SLP;Trials of upgraded texture/liquids;Patient/family education    Potential to Achieve Goals  Good    Potential Considerations  Severity of impairments    SLP Home Exercise Plan  Continue swallow ex and RMT per [facility] SLP; bring exercises to next session    Consulted and Agree with Plan of Care  Patient;Family member/caregiver       Patient will benefit from skilled therapeutic intervention in order to improve the following deficits and impairments:   Oropharyngeal dysphagia    Problem List Patient Active Problem List   Diagnosis Date Noted  . Lower extremity edema 11/24/2019  . Difficulty urinating 11/24/2019  . Dupuytren's contracture of left hand 09/20/2019  . Hyperlipidemia 08/24/2019  . Upper and lower extremity pain 08/24/2019  . OSA on CPAP 08/09/2019  . Copious oral secretions 08/09/2019  . Anemia of chronic disease   . Dysphagia due to recent cerebral  infarction   . Diabetes mellitus type 2 in obese ([place])   . Morbid obesity ([place])   . Status post insertion of percutaneous endoscopic gastrostomy (PEG) tube ([place])   . History of CVA (cerebrovascular accident) 06/27/2019  . Essential hypertension 06/14/2019  . Renal insufficiency 05/30/2019  . Diplopia   . Chronic fatigue     , ,MS, CCC-SLP  01/16/2020, 4:41 PM  [facility] [address] [place], [place], [address] Phone: [phone]   Fax:  [phone]   Name: Michael Bright MRN: [id number] Date of Birth: 04-05-1956

## 2020-01-16 NOTE — Telephone Encounter (Addendum)
Noted.  He can try some liquid Pepcid 20 mg once daily at the opposite time of day as his pantoprazole medicine.  I recommend he try this daily for the next 4 weeks and then stop.  They can get this over-the-counter, or I can send in a prescription. Let me know.  If no improvement then he may need to see GI.

## 2020-01-16 NOTE — Telephone Encounter (Signed)
Called to r/s pt's appt on 5/7. Pt's wife states that Filip has been complaining a lot more lately about acid reflux. She said this is something he has discussed with Jae Dire before and she was wondering what he can take for it.

## 2020-01-17 ENCOUNTER — Other Ambulatory Visit: Payer: Self-pay

## 2020-01-17 ENCOUNTER — Ambulatory Visit: Payer: BC Managed Care – PPO

## 2020-01-17 DIAGNOSIS — R1312 Dysphagia, oropharyngeal phase: Secondary | ICD-10-CM

## 2020-01-17 MED ORDER — FAMOTIDINE 40 MG/5ML PO SUSR: 20.0000 mg | Freq: Two times a day (BID) | ORAL | 3 refills | Status: DC

## 2020-01-17 NOTE — Therapy (Signed)
[facility] [address] [place], [place], [address] Phone: [phone]   Fax:  [phone]  Speech Language Pathology Treatment  Patient Details  Name: Michael Bright MRN: [id number] Date of Birth: 12/26/1955 Referring Provider (SLP): Dr. Swartz   Encounter Date: 01/17/2020  End of Session - 01/17/20 1642    Visit Number  24    Number of Visits  25    Date for SLP Re-Evaluation  02/16/20    SLP Start Time  1533    SLP Stop Time   1613    SLP Time Calculation (min)  40 min    Activity Tolerance  Patient tolerated treatment well       Past Medical History:  Diagnosis Date  . Acute CVA (cerebrovascular accident) ([facility]) 05/30/2019  . Acute kidney injury ([facility])   . Acute respiratory failure ([facility])   . Aspiration pneumonia ([facility]) 06/14/2019  . 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 Diabetes ([facility])   . 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 (CVA) 08/09/2019  . Pneumonia    aspiration pneumonia  . Sepsis with acute renal failure without septic shock ([facility])   . Septic shock ([facility])   . Stroke ([facility])   . Urinary tract infection   . Urinary tract infection without hematuria     Past Surgical History:  Procedure Laterality Date  . CHOLECYSTECTOMY    . IR GASTROSTOMY TUBE MOD SED  07/04/2019    There were no vitals filed for this visit.  Subjective Assessment - 01/17/20 1638    Subjective  Pt has not worked thin in supine back into his routine due to the reflux - SLP told him to work it back into his routine now that reflux is better controlled    Currently in Pain?  No/denies            ADULT SLP TREATMENT - 01/17/20 1639      General Information   Behavior/Cognition  Alert;Cooperative;Pleasant mood      Treatment Provided   Treatment provided  Dysphagia      Cognitive-Linquistic Treatment   Skilled Treatment  NMES placement 3b with mA= 17.0. Audible swallows with effortful swallow were  heard and also with PO swallows of near-nectar throughout session. Pt's average trigger time of swallow with PO swallow was average 2-3 seconds over 7 minutes. Pt's trigger time of effortful swallow without PO incr'd from WNL to average 8.5 seconds by last set of 10 effortful swallows (60 total). Pt req'd rare min A for full breath with EMST, and was independnet today for short quick exhale ("birthday candles"). Other exercises completed WNL procedure.       Assessment / Recommendations / Plan   Plan  Continue with current plan of care;Other (Comment)   next week pt scheduled x1 due to no PM appointments availabl     Progression Toward Goals   Progression toward goals  Progressing toward goals         SLP Short Term Goals - 12/15/19 1510      SLP SHORT TERM GOAL #1   Title  Pt will complete dysphagia HEP with rare min A x 3 sessions.    Status  Partially Met      SLP SHORT TERM GOAL #2   Title  Patient will tolerate electrode placement for NMES x 3 sessions.    Baseline  12-05-19, 12-11-19    Status  Achieved      SLP SHORT TERM GOAL #3     Title  Patient will tell SLP 3 s/sx of aspiration PNA.    Status  Achieved       SLP Long Term Goals - 01/17/20 1643      SLP LONG TERM GOAL #1   Title  Pt will demonstrate dysphagia HEP with modified independence x3 sessions.    Time  1    Period  Weeks    Status  On-going      SLP LONG TERM GOAL #2   Title  Pt will report thorough oral care prior to water/ice chips x6 sessions.    Baseline  11-29-19, 12-15-19 12-20-19, 12-28-19,    Time  1    Period  Weeks    Status  On-going      SLP LONG TERM GOAL #3   Title  pt will tolerate electrode placement for 7 therapy sessions    Baseline  12-11-19, 12-15-19, 12-19-19, 12-20-19, 12-28-19,01-02-20, 01-03-20    Status  Achieved      SLP LONG TERM GOAL #4   Title  pt will swallow within 5 seconds of bolus presentation to oral cavity, or in subsequent swallows at least 80% with at least 50 swallows     Time  1    Period  Weeks    Status  Revised       Plan - 01/17/20 1643    Clinical Impression Statement  Pt cont entering ST room without hydrophonia present.  He continues PEG dependent since hospitalization. See "skilled treatment" for  more details. He reports continuing to take nectar trials at home and will get back to sips of regular water in supine after oral care.  Patient reports completing pharyngeal exercises and RMT (expiratory) with improvement in function noted on previous MBS (10-18-19).  I recommend cont'd skilled ST to address dysphagia for training in aspiration precautions, pharyngeal exercises, and  NMES to improve pt's swallow function for possible advancement to comfort feeds. Advise nectar thick liquid trials and (per MBSS) thin  liquids in supine as much as pt is comfortable.    Speech Therapy Frequency  3x / week   or 25 total visits   Duration  --   8 weeks or 17 visits   Treatment/Interventions  Aspiration precaution training;Cueing hierarchy;SLP instruction and feedback;Pharyngeal strengthening exercises;NMES;Compensatory techniques;Diet toleration management by SLP;Trials of upgraded texture/liquids;Patient/family education    Potential to Achieve Goals  Good    Potential Considerations  Severity of impairments    SLP Home Exercise Plan  Continue swallow ex and RMT per [facility] SLP; bring exercises to next session    Consulted and Agree with Plan of Care  Patient;Family member/caregiver       Patient will benefit from skilled therapeutic intervention in order to improve the following deficits and impairments:   Oropharyngeal dysphagia    Problem List Patient Active Problem List   Diagnosis Date Noted  . Lower extremity edema 11/24/2019  . Difficulty urinating 11/24/2019  . Dupuytren's contracture of left hand 09/20/2019  . Hyperlipidemia 08/24/2019  . Upper and lower extremity pain 08/24/2019  . OSA on CPAP 08/09/2019  . Copious oral secretions 08/09/2019  .  Anemia of chronic disease   . Dysphagia due to recent cerebral infarction   . Diabetes mellitus type 2 in obese ([place])   . Morbid obesity ([place])   . Status post insertion of percutaneous endoscopic gastrostomy (PEG) tube ([place])   . History of CVA (cerebrovascular accident) 06/27/2019  . Essential hypertension 06/14/2019  . Renal insufficiency 05/30/2019  .  Diplopia   . Chronic fatigue     , ,MS, CCC-SLP  01/17/2020, 4:44 PM  [facility] [address] [place], [place], [address] Phone: [phone]   Fax:  [phone]   Name: Michael Bright MRN: [id number] Date of Birth: 1955-11-19

## 2020-01-17 NOTE — Addendum Note (Signed)
Addended by: Doreene Nest on: 01/17/2020 04:25 PM   Modules accepted: Orders

## 2020-01-17 NOTE — Telephone Encounter (Signed)
Spoken to patient's wife and she stated that she is so sorry but they never pick up the pantoprazole because of the insurance. She did not remember until she hung the phone with me.  However, she asked if Jae Dire can sent Rx to pharmacy for them.

## 2020-01-17 NOTE — Patient Instructions (Signed)
  Next week, since you are only coming once we will work on exercises minus the electrical stimulation. We will also do lying down with water.

## 2020-01-17 NOTE — Telephone Encounter (Signed)
Noted, Rx for famotidine sent to pharmacy.

## 2020-01-22 ENCOUNTER — Ambulatory Visit: Payer: BC Managed Care – PPO | Admitting: Speech Pathology

## 2020-01-22 NOTE — Progress Notes (Signed)
I agree with the above plan 

## 2020-01-23 ENCOUNTER — Ambulatory Visit: Payer: BC Managed Care – PPO | Attending: Physical Medicine & Rehabilitation

## 2020-01-23 ENCOUNTER — Other Ambulatory Visit: Payer: Self-pay

## 2020-01-23 DIAGNOSIS — M6281 Muscle weakness (generalized): Secondary | ICD-10-CM | POA: Diagnosis present

## 2020-01-23 DIAGNOSIS — R2681 Unsteadiness on feet: Secondary | ICD-10-CM | POA: Diagnosis present

## 2020-01-23 DIAGNOSIS — R278 Other lack of coordination: Secondary | ICD-10-CM | POA: Diagnosis present

## 2020-01-23 DIAGNOSIS — R209 Unspecified disturbances of skin sensation: Secondary | ICD-10-CM | POA: Diagnosis present

## 2020-01-23 DIAGNOSIS — R2689 Other abnormalities of gait and mobility: Secondary | ICD-10-CM | POA: Insufficient documentation

## 2020-01-23 DIAGNOSIS — R1312 Dysphagia, oropharyngeal phase: Secondary | ICD-10-CM | POA: Diagnosis not present

## 2020-01-23 NOTE — Therapy (Signed)
[facility] [address] [place], [place], [address] Phone: [phone]   Fax:  [phone]  Speech Language Pathology Treatment  Patient Details  Name: Michael Bright MRN: [id number] Date of Birth: Jul 10, 1956 Referring Provider (SLP): Dr. Naaman Plummer   Encounter Date: 01/23/2020  End of Session - 01/23/20 1759    Visit Number  25    Number of Visits  40    Date for SLP Re-Evaluation  03/15/20    SLP Start Time  49    SLP Stop Time   1615    SLP Time Calculation (min)  41 min    Activity Tolerance  Patient tolerated treatment well       Past Medical History:  Diagnosis Date  . Acute CVA (cerebrovascular accident) ([place]) 05/30/2019  . Acute kidney injury ([place])   . Acute respiratory failure ([place])   . Aspiration pneumonia ([place]) 06/14/2019  . 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 Diabetes ([place])   . 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 (CVA) 08/09/2019  . Pneumonia    aspiration pneumonia  . Sepsis with acute renal failure without septic shock ([place])   . Septic shock ([place])   . Stroke ([place])   . Urinary tract infection   . Urinary tract infection without hematuria     Past Surgical History:  Procedure Laterality Date  . CHOLECYSTECTOMY    . IR GASTROSTOMY TUBE MOD SED  07/04/2019    There were no vitals filed for this visit.  Subjective Assessment - 01/23/20 1736    Subjective  Pt brings in carbonated water today - reports he performed oral care prior to appt, <30 minutes ago.    Currently in Pain?  No/denies            ADULT SLP TREATMENT - 01/23/20 1737      General Information   Behavior/Cognition  Alert;Cooperative;Pleasant mood      Treatment Provided   Treatment provided  Dysphagia      Cognitive-Linquistic Treatment   Skilled Treatment  NMES placement 3a with mA= 17.5. Pt coughed x2 during belching - SLP ?s presence of GERD/LPR? Audible swallows with effortful swallow  were heard 100% of the time and also with PO swallows of liquid throughout session. Pt's average trigger time of swallow with PO swallow was average 2-3 seconds over 5 minutes. Pt's trigger time of effortful swallow without PO incr'd from WNL to average 7.5 seconds by last set of 10 effortful swallows (45 total). Pt req'd occasional min A for releasing remainder of breath on exhale and taking a full breath with EMST, and was independnet today for short quick exhale ("birthday candles"). Other exercises completed WNL procedure. SLP suspects pt may be ready for follow up MBSS to track progress week of 02-05-20.       Assessment / Recommendations / Plan   Plan  Continue with current plan of care;Other (Comment)      Dysphagia Recommendations   Diet recommendations  NPO;Thin liquid   thins with NMES with SLP, or in supine        SLP Short Term Goals - 12/15/19 1510      SLP SHORT TERM GOAL #1   Title  Pt will complete dysphagia HEP with rare min A x 3 sessions.    Status  Partially Met      SLP SHORT TERM GOAL #2   Title  Patient will tolerate electrode placement for NMES x 3 sessions.  Baseline  12-05-19, 12-11-19    Status  Achieved      SLP SHORT TERM GOAL #3   Title  Patient will tell SLP 3 s/sx of aspiration PNA.    Status  Achieved       SLP Long Term Goals - 01/23/20 1802      SLP LONG TERM GOAL #1   Title  Pt will demonstrate dysphagia HEP with rare min A x3 sessions.    Time  5    Period  Weeks   or 40 total sessions, for all LTGs   Status  Not Met   and ongoing     SLP LONG TERM GOAL #2   Title  Pt will report thorough oral care prior to water/ice chips x6 sessions.    Baseline  11-29-19, 12-15-19 12-20-19, 12-28-19, 01-23-20    Status  Partially Met      SLP LONG TERM GOAL #3   Title  pt will tolerate electrode placement for 7 therapy sessions    Baseline  12-11-19, 12-15-19, 12-19-19, 12-20-19, 12-28-19,01-02-20, 01-03-20    Status  Achieved      SLP LONG TERM GOAL #4    Title  pt will swallow within average 5 seconds of bolus presentation to oral cavity 80% of the time, with at least 50 swallows    Time  5    Period  Weeks    Status  Not Met   and ongoing     SLP LONG TERM GOAL #5   Title  pt will follow any precautions provided after objective swallow assessments with rare min A over 3 sessions    Time  5    Period  Weeks    Status  New       Plan - 01/23/20 1800    Clinical Impression Statement  Pt cont entering ST room without hydrophonia present, and continues PEG dependent since most recent hospitalization. See "skilled treatment" for  more details. Pt would benefit from follow up modified barium swallow exam/FEES this month. His swallow strength has improved possibly to the point of being ready for a modified PO diet. He will need to remain on x3/week due to NMES treatment modality. He reports continuing to take nectar trials at home and will get back to sips of regular water in supine after oral care.  Patient reports completing pharyngeal exercises and RMT (expiratory) with improvement in function noted on previous MBS (10-18-19).  I recommend cont'd skilled ST to address dysphagia for training in aspiration precautions, pharyngeal exercises, and  NMES to improve pt's swallow function for possible advancement to comfort feeds. Advise nectar thick liquid trials and (per MBSS) thin  liquids in supine as much as pt is comfortable.    Speech Therapy Frequency  3x / week   or 25 total visits   Duration  --   8 weeks or 17 visits   Treatment/Interventions  Aspiration precaution training;Cueing hierarchy;SLP instruction and feedback;Pharyngeal strengthening exercises;NMES;Compensatory techniques;Diet toleration management by SLP;Trials of upgraded texture/liquids;Patient/family education    Potential to Achieve Goals  Good    Potential Considerations  Severity of impairments    SLP Home Exercise Plan  Continue swallow ex and RMT per [facility] SLP; bring exercises  to next session    Consulted and Agree with Plan of Care  Patient;Family member/caregiver       Patient will benefit from skilled therapeutic intervention in order to improve the following deficits and impairments:   Oropharyngeal dysphagia  Problem List Patient Active Problem List   Diagnosis Date Noted  . Lower extremity edema 11/24/2019  . Difficulty urinating 11/24/2019  . Dupuytren's contracture of left hand 09/20/2019  . Hyperlipidemia 08/24/2019  . Upper and lower extremity pain 08/24/2019  . OSA on CPAP 08/09/2019  . Copious oral secretions 08/09/2019  . Anemia of chronic disease   . Dysphagia due to recent cerebral infarction   . Diabetes mellitus type 2 in obese ([place])   . Morbid obesity ([place])   . Status post insertion of percutaneous endoscopic gastrostomy (PEG) tube ([place])   . History of CVA (cerebrovascular accident) 06/27/2019  . Essential hypertension 06/14/2019  . Renal insufficiency 05/30/2019  . Diplopia   . Chronic fatigue     Michael Bright ,MS, CCC-SLP  01/23/2020, 6:07 PM  [facility] [address] [place], [place], [address] Phone: [phone]   Fax:  [phone]   Name: Michael Bright MRN: [id number] Date of Birth: 1955-11-10

## 2020-01-23 NOTE — Patient Instructions (Signed)
I think a follow up swallow test the week of 4-19 is a good idea.

## 2020-01-25 ENCOUNTER — Other Ambulatory Visit: Payer: Self-pay

## 2020-01-25 ENCOUNTER — Ambulatory Visit: Payer: BC Managed Care – PPO | Admitting: Physical Therapy

## 2020-01-25 DIAGNOSIS — R1312 Dysphagia, oropharyngeal phase: Secondary | ICD-10-CM | POA: Diagnosis not present

## 2020-01-25 DIAGNOSIS — R208 Other disturbances of skin sensation: Secondary | ICD-10-CM

## 2020-01-25 DIAGNOSIS — R278 Other lack of coordination: Secondary | ICD-10-CM

## 2020-01-25 DIAGNOSIS — R2681 Unsteadiness on feet: Secondary | ICD-10-CM

## 2020-01-25 DIAGNOSIS — M6281 Muscle weakness (generalized): Secondary | ICD-10-CM

## 2020-01-25 DIAGNOSIS — R2689 Other abnormalities of gait and mobility: Secondary | ICD-10-CM

## 2020-01-25 NOTE — Therapy (Signed)
[facility] [address] [place], [place], [address] Phone: [phone]   Fax:  [phone]  Physical Therapy Treatment  Patient Details  Name: Michael Bright MRN: [id number] Date of Birth: 03/29/1956 Referring Provider (PT): Faith Rogue, MD   Encounter Date: 01/25/2020  PT End of Session - 01/25/20 1545    Visit Number  16    Number of Visits  31   per recert 01/11/2020   Date for PT Re-Evaluation  04/11/20   90 day recert for 60 day POC   Authorization Type  BCBS - 2021 primary, Medicare Secondary    PT Start Time  1445    PT Stop Time  1528    PT Time Calculation (min)  43 min    Equipment Utilized During Treatment  Gait belt    Activity Tolerance  Patient tolerated treatment well    Behavior During Therapy  [facility] for tasks assessed/performed       Past Medical History:  Diagnosis Date  . Acute CVA (cerebrovascular accident) (HCC) 05/30/2019  . Acute kidney injury (HCC)   . Acute respiratory failure (HCC)   . Aspiration pneumonia (HCC) 06/14/2019  . 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 Diabetes (HCC)   . 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 (CVA) 08/09/2019  . Pneumonia    aspiration pneumonia  . Sepsis with acute renal failure without septic shock (HCC)   . Septic shock (HCC)   . Stroke (HCC)   . Urinary tract infection   . Urinary tract infection without hematuria     Past Surgical History:  Procedure Laterality Date  . CHOLECYSTECTOMY    . IR GASTROSTOMY TUBE MOD SED  07/04/2019    There were no vitals filed for this visit.  Subjective Assessment - 01/25/20 1449    Subjective  No falls. Has been doing more walking outside. The side of the thigh is bothersome at times after walking - no pain in the hips. Has been stretching.    Pertinent History  PMH: CVA (L lateral medullary syndrome) diabetes, HTN, OSA, morbid obesity.    Limitations  Standing;Walking;House hold  activities    How long can you stand comfortably?  can stand approx. 10 minutes    How long can you walk comfortably?  20 minutes with the rollator    Patient Stated Goals  wants to walk better, walk more steady, wants to be able to get up more easily.    Currently in Pain?  No/denies    Pain Onset  More than a month ago    Pain Onset  More than a month ago                       [facility] Adult PT Treatment/Exercise - 01/25/20 1510      Transfers   Transfers  Sit to Stand;Stand to Sit    Sit to Stand  5: Supervision;Without upper extremity assist   23" mat table   Sit to Stand Details  Verbal cues for precautions/safety;Verbal cues for technique    Sit to Stand Details (indicate cue type and reason)  cues for anterior weight shift prior to standing     Stand to Sit  5: Supervision;Without upper extremity assist    Stand to Sit Details  cues for eccentric control     Comments  1 x 10 reps from 23" mat table       Ambulation/Gait   Ambulation/Gait  Yes  Ambulation/Gait Assistance  5: Supervision    Assistive device  Rollator    Gait Pattern  Step-through pattern;Decreased step length - right;Decreased step length - left          Balance Exercises - 01/25/20 1552      Balance Exercises: Standing   Other Standing Exercises  Standing in // bars: tandem walking with BUE support down and back 2 reps. Standing on blue foam beam: side stepping down and back 3 reps progressing to fingertip support, cues to slow down and for foot clearance. Forward stepping strategy 1 x 10 reps B with BUE support, cues to slow down and for incr LLE foot clearance as pt has tendency to get L foot caught on the beam. Standing on foam beam with eyes open: static balance with wider BOS 4 x 15 seconds, performing static balance with head turns 2 x 10 reps. With BUE support in // bars: alternating stepping over 4 smaller obstacles, cues to slow down and for heel strike B       Reviewed bolded HEP  below  given at last session:  Access Code: [email or web address] URL: [email or web address] Date: 01/12/2020 Prepared by: Lonia Blood  Program Notes Provided patient handout from file cabinet with forward STEP TAPS, x 10, Side STEP TAPs x 10, Back STEP Taps x 10 (handout is of kicks, but performed with step taps at counter) -cues for technique and for foot clearance   Exercises Sit to Stand - 2 x daily - 5 x weekly - 10 reps - 1 sets Seated Toe Raise - 1 x daily - 7 x weekly - 10 reps - 2 sets - 3 sec hold Quarter Squat with Table - 2 x daily - 5 x weekly - 10 reps - 1 sets -cues for technique, posture, and glute activation  Heel rises with counter support - 2 x daily - 7 x weekly - 10 reps - 2 sets Standing March with Counter Support - 1 x daily - 7 x weekly - 10 reps - 2 sets Standing Romberg to 1/2 Tandem Stance - 1 x daily - 5 x weekly - 3 reps - 1 sets - 10 sec hold Single Leg Stance with Support - 1 x daily - 5 x weekly - 3 reps - 1 sets - 10 sec hold  PT Education - 01/25/20 1544    Education Details  reviewed HEP - needed cues for proper technique and to slow exercises down as pt has tendency to perform too quickly    Person(s) Educated  Patient    Methods  Explanation;Demonstration;Handout    Comprehension  Verbalized understanding;Returned demonstration       PT Short Term Goals - 01/12/20 1600      PT SHORT TERM GOAL #1   Title  Patient will be independent with progression of HEP in order to build upon functional gains made in therapy.  ALL STGS DUE 02/16/2020 (4 weeks-may be delayed due to scheduling)    Time  4    Period  Weeks    Status  Revised      PT SHORT TERM GOAL #2   Title  Patient to improve BERG balance test to at least 42/56 for decreased fall risk.    Baseline  35/56 on 12/13/19, 38/56 on Berg on 01/09/20; 38/56 01/11/2020    Time  4    Period  Weeks    Status  Revised      PT SHORT TERM GOAL #3   Title  Patient will decrease TUG time with  bariatric rollator to 15 seconds of less to demo decr fall risk.    Baseline  30.22 seconds on 10/31/19; 25.93 sec 12/11/2019; 18.97 sec with rollator 01/11/2020    Time  4    Period  Weeks    Status  Revised      PT SHORT TERM GOAL #4   Title  Patient will improve gait velocity with bariatric rollator to at least 2.62 ft/sec to demo a limited community ambulator and decr fall risk.    Baseline  1.1 ft/sec with L AFO and bariatric rollator; 1.6 ft/sec 12/11/2019; 2.54 f/tsec 01/11/2020    Time  4    Period  Weeks    Status  Revised      PT SHORT TERM GOAL #5   Title  Patient will decrease 5x sit <> stand time to 23 seconds or less from  mat table with UE support in order to demo improved functional LE strength.    Baseline  31.06 seconds; 27.19 sec 12/11/2019; 26.32 sec 01/11/2020 (from 20" mat surface)    Time  4    Period  Weeks    Status  Revised        PT Long Term Goals - 01/12/20 5573      PT LONG TERM GOAL #1   Title  Patient will be independent with final HEP in order to build upon functional gains made in therapy. ALL LTGS DUE 03/15/2020 (8 weeks-may be delayed due to scheduling)    Time  8    Period  Weeks    Status  Revised      PT LONG TERM GOAL #2   Title  Berg score to imporve to at least 45/56 for decreased fall risk.    Time  8    Period  Weeks    Status  Revised      PT LONG TERM GOAL #3   Title  Patient will decrease 5x sit <> stand time to 19 seconds of less from elevated mat table vs. standard chair with UE support in order to demo improved functional LE strength.    Time  8    Period  Weeks    Status  On-going      PT LONG TERM GOAL #4   Title  Patient will improve gait velocity with least restrictive assistive device to at least 2.6 ft/sec to demo improved community mobility.    Time  8    Period  Weeks    Status  Revised      PT LONG TERM GOAL #5   Title  Patient will decrease TUG time with least restrictive assistive device to 19 seconds of less to  demo decr fall risk.    Time  8    Period  Weeks    Status  Revised      PT LONG TERM GOAL #6   Title  Patient will ambulate at least 300' with supervision over level indoor and outdoor surfaces with LRAD  with supervision in order to improve community mobility.    Time  8    Period  Weeks    Status  On-going            Plan - 01/25/20 1546    Clinical Impression Statement  Reviewed pt's most recent HEP at today's visit (as pt has not been seen in a couple of weeks due to scheduling) - pt needing verbal and demonstrative cues for  proper techique for stepping, mini squats, and marching. Pt tends to perform exercises too quickly at times, needs verbal cues to slow down. Will continue to progress towards LTGs.    Personal Factors and Comorbidities  Comorbidity 3+;Past/Current Experience;Time since onset of injury/illness/exacerbation    Comorbidities  diabetes, HTN, OSA, morbid obesity.    Examination-Activity Limitations  Squat;Transfers;Locomotion Level;Stairs    Examination-Participation Restrictions  Community Activity    Stability/Clinical Decision Making  Evolving/Moderate complexity    Rehab Potential  Good    PT Frequency  2x / week    PT Duration  8 weeks   per recert 01/11/2020   PT Treatment/Interventions  ADLs/Self Care Home Management;Balance training;Therapeutic exercise;Therapeutic activities;Functional mobility training;Stair training;Gait training;DME Instruction;Aquatic Therapy;Neuromuscular re-education;Patient/family education;Orthotic Fit/Training;Energy conservation    PT Next Visit Plan  how is HEP? continue strength, balance strategies, compliant surfaces, use of aerobic machines and parallel bars for strength/balance    PT Home Exercise Plan  [email or web address] (MedBridge)    Consulted and Agree with Plan of Care  Patient       Patient will benefit from skilled therapeutic intervention in order to improve the following deficits and impairments:  Abnormal gait, Decreased  activity tolerance, Decreased balance, Decreased coordination, Decreased range of motion, Decreased knowledge of use of DME, Decreased knowledge of precautions, Decreased endurance, Decreased strength, Difficulty walking, Postural dysfunction, Impaired sensation, Obesity, Pain  Visit Diagnosis: Muscle weakness (generalized)  Other abnormalities of gait and mobility  Other lack of coordination  Other disturbances of skin sensation  Unsteadiness on feet     Problem List Patient Active Problem List   Diagnosis Date Noted  . Lower extremity edema 11/24/2019  . Difficulty urinating 11/24/2019  . Dupuytren's contracture of left hand 09/20/2019  . Hyperlipidemia 08/24/2019  . Upper and lower extremity pain 08/24/2019  . OSA on CPAP 08/09/2019  . Copious oral secretions 08/09/2019  . Anemia of chronic disease   . Dysphagia due to recent cerebral infarction   . Diabetes mellitus type 2 in obese (HCC)   . Morbid obesity (HCC)   . Status post insertion of percutaneous endoscopic gastrostomy (PEG) tube (HCC)   . History of CVA (cerebrovascular accident) 06/27/2019  . Essential hypertension 06/14/2019  . Renal insufficiency 05/30/2019  . Diplopia   . Chronic fatigue     Drake Leach, PT, DPT  01/25/2020, 3:53 PM  [facility] [address] [place], [place], [address] Phone: [phone]   Fax:  [phone]  Name: Michael Bright MRN: [id number] Date of Birth: 09-Sep-1956

## 2020-01-29 ENCOUNTER — Encounter: Payer: BC Managed Care – PPO | Admitting: Speech Pathology

## 2020-01-30 ENCOUNTER — Encounter: Payer: BC Managed Care – PPO | Admitting: Occupational Therapy

## 2020-01-30 ENCOUNTER — Ambulatory Visit: Payer: BC Managed Care – PPO | Admitting: Physical Therapy

## 2020-01-31 ENCOUNTER — Encounter
Payer: BC Managed Care – PPO | Attending: Physical Medicine & Rehabilitation | Admitting: Physical Medicine & Rehabilitation

## 2020-01-31 ENCOUNTER — Other Ambulatory Visit: Payer: Self-pay

## 2020-01-31 ENCOUNTER — Encounter: Payer: Self-pay | Admitting: Physical Medicine & Rehabilitation

## 2020-01-31 VITALS — BP 130/75 | HR 83 | Temp 98.7°F | Ht 72.0 in | Wt 292.0 lb

## 2020-01-31 DIAGNOSIS — E1165 Type 2 diabetes mellitus with hyperglycemia: Secondary | ICD-10-CM | POA: Insufficient documentation

## 2020-01-31 DIAGNOSIS — R600 Localized edema: Secondary | ICD-10-CM | POA: Diagnosis not present

## 2020-01-31 DIAGNOSIS — M79606 Pain in leg, unspecified: Secondary | ICD-10-CM | POA: Insufficient documentation

## 2020-01-31 DIAGNOSIS — Z8673 Personal history of transient ischemic attack (TIA), and cerebral infarction without residual deficits: Secondary | ICD-10-CM

## 2020-01-31 DIAGNOSIS — Z9989 Dependence on other enabling machines and devices: Secondary | ICD-10-CM | POA: Diagnosis present

## 2020-01-31 DIAGNOSIS — I69391 Dysphagia following cerebral infarction: Secondary | ICD-10-CM | POA: Insufficient documentation

## 2020-01-31 DIAGNOSIS — M79603 Pain in arm, unspecified: Secondary | ICD-10-CM | POA: Diagnosis present

## 2020-01-31 DIAGNOSIS — R5381 Other malaise: Secondary | ICD-10-CM | POA: Diagnosis present

## 2020-01-31 DIAGNOSIS — I639 Cerebral infarction, unspecified: Secondary | ICD-10-CM | POA: Insufficient documentation

## 2020-01-31 DIAGNOSIS — G464 Cerebellar stroke syndrome: Secondary | ICD-10-CM

## 2020-01-31 DIAGNOSIS — G4733 Obstructive sleep apnea (adult) (pediatric): Secondary | ICD-10-CM | POA: Insufficient documentation

## 2020-01-31 DIAGNOSIS — R5382 Chronic fatigue, unspecified: Secondary | ICD-10-CM | POA: Diagnosis present

## 2020-01-31 MED ORDER — PANTOPRAZOLE SODIUM 40 MG PO PACK: 40.0000 mg | PACK | Freq: Every day | ORAL | 3 refills | Status: DC

## 2020-01-31 NOTE — Progress Notes (Signed)
Subjective:    Patient ID: Michael Bright, male    DOB: November 16, 1955, 63 y.o.   MRN: [id number]  HPI   Michael Bright is here in follow up of his lateral medullary infarct and associated deficits. He has had prolonged dysphagia. His last MBS demonstrated some improvement although he remains NPO. SLP is considering another MBS over the next few weeks.  He continues to work with outpatient PT and OT as well and is making progress with his gait.  He is using a rolling walker for mobility.  He is really having less pain although it can be intermittent and aching at times.  His finger contractures have shown some improvement.  I asked him about his fatigue and he feels that with the change in weather and being able to go outside on occasion that his energy levels have been improved.  He does report some swelling in his lower legs/feet left more than right side.     Pain Inventory Average Pain 4 Pain Right Now 4 My pain is intermittent and aching  In the last 24 hours, has pain interfered with the following? General activity 4 Relation with others 4 Enjoyment of life 4 What TIME of day is your pain at its worst? morning and daytime Sleep (in general) Fair  Pain is worse with: walking Pain improves with: medication Relief from Meds: 4  Mobility walk with assistance use a walker do you drive?  no  Function disabled: date disabled 10/1998 I need assistance with the following:  meal prep, household duties and shopping  Neuro/Psych No problems in this area  Prior Studies Any changes since last visit?  no  Physicians involved in your care Any changes since last visit?  no   No family history on file. Social History   Socioeconomic History  . Marital status: Married    Spouse name: Not on file  . Number of children: Not on file  . Years of education: Not on file  . Highest education level: Not on file  Occupational History  . Not on file  Tobacco Use  . Smoking status: Never  Smoker  . Smokeless tobacco: Never Used  Substance and Sexual Activity  . Alcohol use: Yes  . Drug use: No  . Sexual activity: Yes  Other Topics Concern  . Not on file  Social History Narrative  . Not on file   Social Determinants of Health   Financial Resource Strain:   . Difficulty of Paying Living Expenses:   Food Insecurity:   . Worried About Charity fundraiser in the Last Year:   . Arboriculturist in the Last Year:   Transportation Needs:   . Film/video editor (Medical):   Marland Kitchen Lack of Transportation (Non-Medical):   Physical Activity:   . Days of Exercise per Week:   . Minutes of Exercise per Session:   Stress:   . Feeling of Stress :   Social Connections:   . Frequency of Communication with Friends and Family:   . Frequency of Social Gatherings with Friends and Family:   . Attends Religious Services:   . Active Member of Clubs or Organizations:   . Attends Archivist Meetings:   Marland Kitchen Marital Status:    Past Surgical History:  Procedure Laterality Date  . CHOLECYSTECTOMY    . IR GASTROSTOMY TUBE MOD SED  07/04/2019   Past Medical History:  Diagnosis Date  . Acute CVA (cerebrovascular accident) ([place]) 05/30/2019  .  Acute kidney injury (HCC)   . Acute respiratory failure (HCC)   . Aspiration pneumonia (HCC) 06/14/2019  . 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 Diabetes (HCC)   . 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 (CVA) 08/09/2019  . Pneumonia    aspiration pneumonia  . Sepsis with acute renal failure without septic shock (HCC)   . Septic shock (HCC)   . Stroke (HCC)   . Urinary tract infection   . Urinary tract infection without hematuria    Temp 98.7 F (37.1 C)   Ht 6' (1.829 m)   Wt 292 lb (132.5 kg)   BMI 39.60 kg/m   Opioid Risk Score:   Fall Risk Score:  `1  Depression screen PHQ 2/9  Depression screen [facility] 2/9 09/12/2019 08/22/2019  Decreased Interest 0 0  Down, Depressed, Hopeless 0 0  PHQ - 2 Score 0 0    Altered sleeping 0 0  Tired, decreased energy 3 3  Change in appetite 0 0  Feeling bad or failure about yourself  0 0  Trouble concentrating 0 0  Moving slowly or fidgety/restless 0 0  Suicidal thoughts 0 0  PHQ-9 Score 3 3  Difficult doing work/chores - Not difficult at all     Review of Systems  All other systems reviewed and are negative.      Objective:   Physical Exam     General: No acute distress, obese HEENT: EOMI, oral membranes moist. Voice is stronger.  Cards: reg rate  Chest: normal effort Abdomen: Soft, NT, ND Skin: dry, intact Extremities: tr LE edema   Neuro: dysarthric, wet voice, sensory loss right arm and legs.    Marland Kitchen ataxic LUE and LLE. Gait improves although sl wide based.Marland Kitchen  Psych: Pt's affect is appropriate. Pt is cooperative Musculoskeletal: left 4th/3rd finger contractures. 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  2 DM: Controlled with current meds 3. OSA on CPAP: PCP Following.  3 Pain: maintain gabapentin for neuropathic pain.   4. Fatigue             -optimize sleep             -using CPAP            -some improvement in energy with the change in season  5.  Mild Dupuytren's contractures of the left third and fourth fingers             - improved   15 minutes of direct patient time was spent in the office today.  I will see him back in about 3 months.

## 2020-01-31 NOTE — Patient Instructions (Signed)
Senna--increase twice daily thru tube

## 2020-02-02 ENCOUNTER — Ambulatory Visit: Payer: BC Managed Care – PPO | Admitting: Physical Therapy

## 2020-02-02 ENCOUNTER — Other Ambulatory Visit: Payer: Self-pay | Admitting: Primary Care

## 2020-02-02 ENCOUNTER — Encounter: Payer: BC Managed Care – PPO | Admitting: Occupational Therapy

## 2020-02-02 DIAGNOSIS — K5900 Constipation, unspecified: Secondary | ICD-10-CM

## 2020-02-03 ENCOUNTER — Other Ambulatory Visit: Payer: Self-pay | Admitting: Primary Care

## 2020-02-03 DIAGNOSIS — Z8673 Personal history of transient ischemic attack (TIA), and cerebral infarction without residual deficits: Secondary | ICD-10-CM

## 2020-02-03 DIAGNOSIS — I69998 Other sequelae following unspecified cerebrovascular disease: Secondary | ICD-10-CM

## 2020-02-03 DIAGNOSIS — R6889 Other general symptoms and signs: Secondary | ICD-10-CM

## 2020-02-03 DIAGNOSIS — N319 Neuromuscular dysfunction of bladder, unspecified: Secondary | ICD-10-CM

## 2020-02-05 ENCOUNTER — Other Ambulatory Visit: Payer: Self-pay

## 2020-02-05 ENCOUNTER — Encounter: Payer: BC Managed Care – PPO | Admitting: Speech Pathology

## 2020-02-05 ENCOUNTER — Ambulatory Visit: Payer: BC Managed Care – PPO | Admitting: Physical Therapy

## 2020-02-05 ENCOUNTER — Ambulatory Visit: Payer: BC Managed Care – PPO

## 2020-02-05 DIAGNOSIS — R1312 Dysphagia, oropharyngeal phase: Secondary | ICD-10-CM

## 2020-02-05 NOTE — Patient Instructions (Addendum)
Dr. Riley Kill should be getting back to me about the modified barium swallow test soon.   If it looks like you still aren't able to eat by mouth after that test, I recommend a referral to [facility].

## 2020-02-05 NOTE — Therapy (Signed)
[facility] [address] [place], [place], [address] Phone: [phone]   Fax:  [phone]  Speech Language Pathology Treatment  Patient Details  Name: Michael Bright MRN: [id number] Date of Birth: 1956-05-18 Referring Provider (SLP): Dr. Naaman Plummer   Encounter Date: 02/05/2020  End of Session - 02/05/20 1637    Visit Number  26    Number of Visits  40    Date for SLP Re-Evaluation  03/15/20    SLP Start Time  5102    SLP Stop Time   5852    SLP Time Calculation (min)  40 min    Activity Tolerance  Patient tolerated treatment well       Past Medical History:  Diagnosis Date  . Acute CVA (cerebrovascular accident) ([place]) 05/30/2019  . Acute kidney injury ([place])   . Acute respiratory failure ([place])   . Aspiration pneumonia ([place]) 06/14/2019  . 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 Diabetes ([place])   . 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 (CVA) 08/09/2019  . Pneumonia    aspiration pneumonia  . Sepsis with acute renal failure without septic shock ([place])   . Septic shock ([place])   . Stroke ([place])   . Urinary tract infection   . Urinary tract infection without hematuria     Past Surgical History:  Procedure Laterality Date  . CHOLECYSTECTOMY    . IR GASTROSTOMY TUBE MOD SED  07/04/2019    There were no vitals filed for this visit.  Subjective Assessment - 02/05/20 1544    Subjective  Pt brings in apple juice today - reports he performed oral care prior to appt, <30 minutes ago.    Currently in Pain?  No/denies            ADULT SLP TREATMENT - 02/05/20 1545      General Information   Behavior/Cognition  Alert;Cooperative;Pleasant mood      Treatment Provided   Treatment provided  Dysphagia      Cognitive-Linquistic Treatment   Skilled Treatment  SLP recommended to MD follow up MBSS ASAP. Pt reports he does not have same frequency of cough as he did prior ot previous MBSS in  February. NMES placement 3b with mA ultimately at 17.5 mA 3 minutes into ST.  Consistent audible swallows with effortful swallow and liquid POs were heard 100% of the time. Pt's average trigger time of swallow with PO swallow was average <3 seconds in first 7 minutes. Pt's trigger time of effortful swallow without PO incr'd from WNL to average 3 seconds in first 5 minutes, and in last set of 10 effortful swallows (40 total) trigger occurred average 5-6 seconds. Pt req'd rare min A for relesing remainder of air on EMST and SLP adjusted pt's EMST device up, to approx 105cm H2O, which pt stated was effort level of "8"/10 where 10 is most effort possible. SPL had to cue pt for keeping shoulders still with EMST.  Other exercises completed with independence.       Assessment / Recommendations / Plan   Plan  Continue with current plan of care;Other (Comment)      Dysphagia Recommendations   Diet recommendations  NPO;Thin liquid;Nectar-thick liquid         SLP Short Term Goals - 12/15/19 1510      SLP SHORT TERM GOAL #1   Title  Pt will complete dysphagia HEP with rare min A x 3 sessions.    Status  Partially Met  SLP SHORT TERM GOAL #2   Title  Patient will tolerate electrode placement for NMES x 3 sessions.    Baseline  12-05-19, 12-11-19    Status  Achieved      SLP SHORT TERM GOAL #3   Title  Patient will tell SLP 3 s/sx of aspiration PNA.    Status  Achieved       SLP Long Term Goals - 02/05/20 1641      SLP LONG TERM GOAL #1   Title  Pt will demonstrate dysphagia HEP with rare min A x3 sessions.    Baseline  02-05-20    Time  4    Period  Weeks   or 40 total sessions, for all LTGs   Status  On-going   and ongoing     SLP LONG TERM GOAL #2   Title  Pt will report thorough oral care prior to water/ice chips x6 sessions.    Baseline  11-29-19, 12-15-19 12-20-19, 12-28-19, 01-23-20    Status  Partially Met      SLP LONG TERM GOAL #3   Title  pt will tolerate electrode placement for  7 therapy sessions    Baseline  12-11-19, 12-15-19, 12-19-19, 12-20-19, 12-28-19,01-02-20, 01-03-20    Status  Achieved      SLP LONG TERM GOAL #4   Title  pt will swallow within average 5 seconds of bolus presentation to oral cavity 80% of the time, with at least 50 swallows    Time  4    Period  Weeks    Status  On-going   and ongoing     SLP LONG TERM GOAL #5   Title  pt will follow any precautions provided after objective swallow assessments with rare min A over 3 sessions    Time  5    Period  Weeks    Status  On-going       Plan - 02/05/20 1637    Clinical Impression Statement  No hydrophonia present today.Pt continues PEG dependent. See "skilled treatment" for  more details. Pt would benefit from follow up modified barium swallow exam/FEES this month; request sent to Dr. Naaman Plummer today. Pt reports he feels he is coughing less with PO trials at home than before his previous MBSS. He tells SLP he is continuing to take nectar trials at home and will take sips of regular water in supine after oral care.  I recommend cont'd skilled ST to address dysphagia for training in aspiration precautions, pharyngeal exercises, and  NMES to improve pt's swallow function for possible advancement to comfort feeds. Advise nectar thick liquid trials and (per MBSS) thin  liquids in supine as much as pt is comfortable.If after this next MBSS pt is still unable to initiate PO diet, recommend referral to [facility].    Speech Therapy Frequency  3x / week   or 25 total visits   Duration  --   8 weeks or 17 visits   Treatment/Interventions  Aspiration precaution training;Cueing hierarchy;SLP instruction and feedback;Pharyngeal strengthening exercises;NMES;Compensatory techniques;Diet toleration management by SLP;Trials of upgraded texture/liquids;Patient/family education    Potential to Achieve Goals  Good    Potential Considerations  Severity of impairments    SLP Home Exercise Plan  Continue swallow  ex and RMT per [facility] SLP; bring exercises to next session    Consulted and Agree with Plan of Care  Patient;Family member/caregiver       Patient will benefit from skilled therapeutic intervention in order  to improve the following deficits and impairments:   Oropharyngeal dysphagia    Problem List Patient Active Problem List   Diagnosis Date Noted  . Lower extremity edema 11/24/2019  . Difficulty urinating 11/24/2019  . Dupuytren's contracture of left hand 09/20/2019  . Hyperlipidemia 08/24/2019  . Upper and lower extremity pain 08/24/2019  . OSA on CPAP 08/09/2019  . Copious oral secretions 08/09/2019  . Anemia of chronic disease   . Dysphagia due to recent cerebral infarction   . Diabetes mellitus type 2 in obese ([place])   . Morbid obesity ([place])   . Status post insertion of percutaneous endoscopic gastrostomy (PEG) tube ([place])   . History of CVA (cerebrovascular accident) 06/27/2019  . Essential hypertension 06/14/2019  . Renal insufficiency 05/30/2019  . Diplopia   . Chronic fatigue     Pj Zehner ,MS, CCC-SLP  02/05/2020, 4:42 PM  [facility] [address] [place], [place], [address] Phone: [phone]   Fax:  [phone]   Name: Michael Bright MRN: [id number] Date of Birth: 04-17-1956

## 2020-02-07 ENCOUNTER — Encounter: Payer: BC Managed Care – PPO | Admitting: Occupational Therapy

## 2020-02-07 ENCOUNTER — Other Ambulatory Visit: Payer: Self-pay

## 2020-02-07 ENCOUNTER — Ambulatory Visit: Payer: BC Managed Care – PPO

## 2020-02-07 ENCOUNTER — Ambulatory Visit: Payer: BC Managed Care – PPO | Admitting: Physical Therapy

## 2020-02-07 DIAGNOSIS — R2689 Other abnormalities of gait and mobility: Secondary | ICD-10-CM

## 2020-02-07 DIAGNOSIS — R2681 Unsteadiness on feet: Secondary | ICD-10-CM

## 2020-02-07 DIAGNOSIS — R278 Other lack of coordination: Secondary | ICD-10-CM

## 2020-02-07 DIAGNOSIS — R1312 Dysphagia, oropharyngeal phase: Secondary | ICD-10-CM | POA: Diagnosis not present

## 2020-02-07 DIAGNOSIS — M6281 Muscle weakness (generalized): Secondary | ICD-10-CM

## 2020-02-07 NOTE — Therapy (Signed)
[facility] [address] [place], [place], [address] Phone: [phone]   Fax:  [phone]  Physical Therapy Treatment  Patient Details  Name: Michael Bright MRN: [id number] Date of Birth: Oct 21, 1955 Referring Provider (PT): Faith Rogue, MD   Encounter Date: 02/07/2020  PT End of Session - 02/07/20 1551    Visit Number  17    Number of Visits  31   per recert 01/11/2020   Date for PT Re-Evaluation  04/11/20   90 day recert for 60 day POC   Authorization Type  [employer]BS - 2021 primary, Medicare Secondary    PT Start Time  1502   was able to see pt earlier due to a cancellation   PT Stop Time  1543    PT Time Calculation (min)  41 min    Equipment Utilized During Treatment  Gait belt    Activity Tolerance  Patient tolerated treatment well    Behavior During Therapy  [facility] for tasks assessed/performed       Past Medical History:  Diagnosis Date  . Acute CVA (cerebrovascular accident) ([facility]) 05/30/2019  . Acute kidney injury ([facility])   . Acute respiratory failure ([facility])   . Aspiration pneumonia ([facility]) 06/14/2019  . 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 Diabetes ([facility])   . 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 (CVA) 08/09/2019  . Pneumonia    aspiration pneumonia  . Sepsis with acute renal failure without septic shock ([facility])   . Septic shock ([facility])   . Stroke ([facility])   . Urinary tract infection   . Urinary tract infection without hematuria     Past Surgical History:  Procedure Laterality Date  . CHOLECYSTECTOMY    . IR GASTROSTOMY TUBE MOD SED  07/04/2019    There were no vitals filed for this visit.  Subjective Assessment - 02/07/20 1505    Subjective  Has been trying to do a little more walking at home. The side of the thigh is getting better - has been trying to stretch. Has had some swelling in the R foot, Dr. Riley Kill is aware and told him to elevate his feet - did not want him on fluid pills.     Pertinent History  PMH: CVA (L lateral medullary syndrome) diabetes, HTN, OSA, morbid obesity.    Limitations  Standing;Walking;House hold activities    How long can you stand comfortably?  can stand approx. 10 minutes    How long can you walk comfortably?  20 minutes with the rollator    Patient Stated Goals  wants to walk better, walk more steady, wants to be able to get up more easily.    Currently in Pain?  No/denies    Pain Onset  More than a month ago    Pain Onset  More than a month ago                       [facility] Adult PT Treatment/Exercise - 02/07/20 1521      Transfers   Transfers  Sit to Stand;Stand to Sit    Sit to Stand  5: Supervision;Without upper extremity assist;4: Min guard    Sit to Stand Details  Verbal cues for precautions/safety;Verbal cues for technique    Stand to Sit  5: Supervision;Without upper extremity assist    Comments  from 23 inch mat table: performed x3 reps with feet evenly apart with initial cues for forward weight shift, performed an additional  x8 reps with LLE staggered posteriorly for incr weight shift/weight bearing with pt needing min guard initially after 1st rep      Ambulation/Gait   Ambulation/Gait  Yes    Ambulation/Gait Assistance  5: Supervision    Ambulation/Gait Assistance Details  cues for heel > toe pattern and keeping "feet under the rollator seat" in order to maintain more upright posture and keep close to rollator, pt with increased difficulty performing heel > toe with RLE    Ambulation Distance (Feet)  230 Feet    Assistive device  Rollator   bariatric   Gait Pattern  Step-through pattern;Decreased step length - right;Decreased step length - left;Decreased dorsiflexion - right    Ambulation Surface  Level;Indoor      Therapeutic Activites    Therapeutic Activities  Other Therapeutic Activities    Other Therapeutic Activities  pt stating he has had increased swelling in L ankle, therapist assessed with pt having  mild pitting edema B. pt states that Dr. Naaman Plummer is aware and did not put him on any fluid pills, but instead told pt to elevate his legs. pt states the only time he is doing this is when he can lay down in hospital bed and elevate his feet and can't perform in sitting because he would need to buy a recliner. Discussed with pt about using a stool to prop BLE on when sitting in a chair with back support to elevate and to also perform ankle pumps when in sitting/with feet elevated, pt verbalized understanding        Exercises   Exercises  Other Exercises    Other Exercises   standing in // bars: with single UE support forward and backwards marching x4 reps, with cues to slow down and for incr foot clearance, x10 reps heel <> toe raises with wide BOS, standing on blue foam beam with wide BOS - static standing 4 x 20 seconds, 2 x 5 reps head turns R/L with min A for balance      Knee/Hip Exercises: Standing   Forward Step Up  Left;Both;10 reps;Hand Hold: 2;Step Height: 6";1 set    Forward Step Up Limitations  cues for incr hip/knee flexion to step foot up as well as to clear LLE when stepping back down to floor, as foot had tendency to "scuff" on the step    Other Standing Knee Exercises  at bottom of staircase holding on with BUE: x10 reps B single leg tap to 1st step and then to 2nd step and then back down to floor for incr hip/knee flexion for foot clearance             PT Education - 02/07/20 1550    Education Details  educated on elevating BLE to decrease swelling/performing ankle pumps    Person(s) Educated  Patient    Methods  Explanation;Demonstration    Comprehension  Verbalized understanding;Returned demonstration       PT Short Term Goals - 01/12/20 1600      PT SHORT TERM GOAL #1   Title  Patient will be independent with progression of HEP in order to build upon functional gains made in therapy.  ALL STGS DUE 02/16/2020 (4 weeks-may be delayed due to scheduling)    Time  4     Period  Weeks    Status  Revised      PT SHORT TERM GOAL #2   Title  Patient to improve BERG balance test to at least 42/56 for decreased  fall risk.    Baseline  35/56 on 12/13/19, 38/56 on Berg on 01/09/20; 38/56 01/11/2020    Time  4    Period  Weeks    Status  Revised      PT SHORT TERM GOAL #3   Title  Patient will decrease TUG time with bariatric rollator to 15 seconds of less to demo decr fall risk.    Baseline  30.22 seconds on 10/31/19; 25.93 sec 12/11/2019; 18.97 sec with rollator 01/11/2020    Time  4    Period  Weeks    Status  Revised      PT SHORT TERM GOAL #4   Title  Patient will improve gait velocity with bariatric rollator to at least 2.62 ft/sec to demo a limited community ambulator and decr fall risk.    Baseline  1.1 ft/sec with L AFO and bariatric rollator; 1.6 ft/sec 12/11/2019; 2.54 f/tsec 01/11/2020    Time  4    Period  Weeks    Status  Revised      PT SHORT TERM GOAL #5   Title  Patient will decrease 5x sit <> stand time to 23 seconds or less from  mat table with UE support in order to demo improved functional LE strength.    Baseline  31.06 seconds; 27.19 sec 12/11/2019; 26.32 sec 01/11/2020 (from 20" mat surface)    Time  4    Period  Weeks    Status  Revised        PT Long Term Goals - 01/12/20 4917      PT LONG TERM GOAL #1   Title  Patient will be independent with final HEP in order to build upon functional gains made in therapy. ALL LTGS DUE 03/15/2020 (8 weeks-may be delayed due to scheduling)    Time  8    Period  Weeks    Status  Revised      PT LONG TERM GOAL #2   Title  Berg score to imporve to at least 45/56 for decreased fall risk.    Time  8    Period  Weeks    Status  Revised      PT LONG TERM GOAL #3   Title  Patient will decrease 5x sit <> stand time to 19 seconds of less from elevated mat table vs. standard chair with UE support in order to demo improved functional LE strength.    Time  8    Period  Weeks    Status  On-going       PT LONG TERM GOAL #4   Title  Patient will improve gait velocity with least restrictive assistive device to at least 2.6 ft/sec to demo improved community mobility.    Time  8    Period  Weeks    Status  Revised      PT LONG TERM GOAL #5   Title  Patient will decrease TUG time with least restrictive assistive device to 19 seconds of less to demo decr fall risk.    Time  8    Period  Weeks    Status  Revised      PT LONG TERM GOAL #6   Title  Patient will ambulate at least 300' with supervision over level indoor and outdoor surfaces with LRAD  with supervision in order to improve community mobility.    Time  8    Period  Weeks    Status  On-going  Patient will benefit from skilled therapeutic intervention in order to improve the following deficits and impairments:     Visit Diagnosis: Muscle weakness (generalized)  Other abnormalities of gait and mobility  Other lack of coordination  Unsteadiness on feet     Problem List Patient Active Problem List   Diagnosis Date Noted  . Lower extremity edema 11/24/2019  . Difficulty urinating 11/24/2019  . Dupuytren's contracture of left hand 09/20/2019  . Hyperlipidemia 08/24/2019  . Upper and lower extremity pain 08/24/2019  . OSA on CPAP 08/09/2019  . Copious oral secretions 08/09/2019  . Anemia of chronic disease   . Dysphagia due to recent cerebral infarction   . Diabetes mellitus type 2 in obese (H[facility])   . Morbid obesity (HCC)   . Status post insertion of percutaneous endoscopic gastrostomy (PEG) tube ([facility])   . History of CVA (cerebrovascular accident) 06/27/2019  . Essential hypertension 06/14/2019  . Renal insufficiency 05/30/2019  . Diplopia   . Chronic fatigue     Drake Leach, PT, DPT  02/07/2020, 4:00 PM  [facility] [address] [place], [place], [address] Phone: [phone]   Fax:  [phone]  Name: Maron Stanzione MRN:  [id number] Date of Birth: 1955/12/20

## 2020-02-07 NOTE — Therapy (Signed)
[facility] [address] [place], [place], [address] Phone: [phone]   Fax:  [phone]  Speech Language Pathology Treatment  Patient Details  Name: Michael Bright MRN: [id number] Date of Birth: 01-03-1956 Referring Provider (SLP): Dr. Naaman Plummer   Encounter Date: 02/07/2020  End of Session - 02/07/20 1726    Visit Number  27    Number of Visits  40    Date for SLP Re-Evaluation  03/15/20    SLP Start Time  1404    SLP Stop Time   9323    SLP Time Calculation (min)  38 min    Activity Tolerance  Patient tolerated treatment well       Past Medical History:  Diagnosis Date  . Acute CVA (cerebrovascular accident) ([place]) 05/30/2019  . Acute kidney injury ([place])   . Acute respiratory failure ([place])   . Aspiration pneumonia ([place]) 06/14/2019  . 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 Diabetes ([place])   . 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 (CVA) 08/09/2019  . Pneumonia    aspiration pneumonia  . Sepsis with acute renal failure without septic shock ([place])   . Septic shock ([place])   . Stroke ([place])   . Urinary tract infection   . Urinary tract infection without hematuria     Past Surgical History:  Procedure Laterality Date  . CHOLECYSTECTOMY    . IR GASTROSTOMY TUBE MOD SED  07/04/2019    There were no vitals filed for this visit.         ADULT SLP TREATMENT - 02/07/20 1430      General Information   Behavior/Cognition  Alert;Cooperative;Pleasant mood      Treatment Provided   Treatment provided  Dysphagia      Dysphagia Treatment   Temperature Spikes Noted  No    Respiratory Status  Room air    Treatment Methods  Therapeutic exercise;Skilled observation;Patient/caregiver education    Patient observed directly with PO's  Yes    Type of PO's observed  Thin liquids    Feeding  Able to feed self    Liquids provided via  --   bottle   Pharyngeal Phase Signs & Symptoms  Audible  swallow      Cognitive-Linquistic Treatment   Skilled Treatment  Pt seen today with NMES at placement 3A with 17.5 mA. PO trials with apple juice - no overt s/s aspiration. Pt told SLP he provided thorough oral care within 30 minutes of NMES. Pt independent with all HEP. After 45 non-PO swallows in sets of 7-10 reps pt average overall was 7 seconds, in teh last 15 reps average delay was 9-10 seconds Pt cont to report EMST effort as 8/10.        Assessment / Recommendations / Plan   Plan  Continue with current plan of care   MBSS whenever MD script received     Dysphagia Recommendations   Diet recommendations  NPO;Thin liquid   in supine     Progression Toward Goals   Progression toward goals  Progressing toward goals         SLP Short Term Goals - 12/15/19 1510      SLP SHORT TERM GOAL #1   Title  Pt will complete dysphagia HEP with rare min A x 3 sessions.    Status  Partially Met      SLP SHORT TERM GOAL #2   Title  Patient will tolerate electrode placement for NMES x 3  sessions.    Baseline  12-05-19, 12-11-19    Status  Achieved      SLP SHORT TERM GOAL #3   Title  Patient will tell SLP 3 s/sx of aspiration PNA.    Status  Achieved       SLP Long Term Goals - 02/07/20 1727      SLP LONG TERM GOAL #1   Title  Pt will demonstrate dysphagia HEP with rare min A x3 sessions.    Baseline  02-05-20, 02-07-20    Time  4    Period  Weeks   or 40 total sessions, for all LTGs   Status  On-going   and ongoing     SLP LONG TERM GOAL #2   Title  Pt will report thorough oral care prior to water/ice chips x6 sessions.    Baseline  11-29-19, 12-15-19 12-20-19, 12-28-19, 01-23-20    Status  Partially Met      SLP LONG TERM GOAL #3   Title  pt will tolerate electrode placement for 7 therapy sessions    Baseline  12-11-19, 12-15-19, 12-19-19, 12-20-19, 12-28-19,01-02-20, 01-03-20    Status  Achieved      SLP LONG TERM GOAL #4   Title  pt will swallow within average 5 seconds of bolus  presentation to oral cavity 80% of the time, with at least 50 swallows    Time  4    Period  Weeks    Status  On-going   and ongoing     SLP LONG TERM GOAL #5   Title  pt will follow any precautions provided after objective swallow assessments with rare min A over 3 sessions    Time  5    Period  Weeks    Status  On-going       Plan - 02/07/20 1727    Clinical Impression Statement  No hydrophonia present today.Pt continues PEG dependent. See "skilled treatment" for  more details. Pt would benefit from follow up modified barium swallow exam/FEES this month; request sent to Dr. Naaman Plummer today. Pt reports he feels he is coughing less with PO trials at home than before his previous MBSS. He tells SLP he is continuing to take nectar trials at home and will take sips of regular water in supine after oral care.  I recommend cont'd skilled ST to address dysphagia for training in aspiration precautions, pharyngeal exercises, and  NMES to improve pt's swallow function for possible advancement to comfort feeds. Advise nectar thick liquid trials and (per MBSS) thin  liquids in supine as much as pt is comfortable.If after this next MBSS pt is still unable to initiate PO diet, recommend referral to [facility].    Speech Therapy Frequency  3x / week   or 25 total visits   Duration  --   8 weeks or 17 visits   Treatment/Interventions  Aspiration precaution training;Cueing hierarchy;SLP instruction and feedback;Pharyngeal strengthening exercises;NMES;Compensatory techniques;Diet toleration management by SLP;Trials of upgraded texture/liquids;Patient/family education    Potential to Achieve Goals  Good    Potential Considerations  Severity of impairments    SLP Home Exercise Plan  Continue swallow ex and RMT per [facility] SLP; bring exercises to next session    Consulted and Agree with Plan of Care  Patient;Family member/caregiver       Patient will benefit from skilled therapeutic intervention in  order to improve the following deficits and impairments:   Oropharyngeal dysphagia    Problem List Patient  Active Problem List   Diagnosis Date Noted  . Lower extremity edema 11/24/2019  . Difficulty urinating 11/24/2019  . Dupuytren's contracture of left hand 09/20/2019  . Hyperlipidemia 08/24/2019  . Upper and lower extremity pain 08/24/2019  . OSA on CPAP 08/09/2019  . Copious oral secretions 08/09/2019  . Anemia of chronic disease   . Dysphagia due to recent cerebral infarction   . Diabetes mellitus type 2 in obese ([place])   . Morbid obesity ([place])   . Status post insertion of percutaneous endoscopic gastrostomy (PEG) tube ([place])   . History of CVA (cerebrovascular accident) 06/27/2019  . Essential hypertension 06/14/2019  . Renal insufficiency 05/30/2019  . Diplopia   . Chronic fatigue     Tonnie Friedel ,MS, CCC-SLP  02/07/2020, 5:28 PM  [facility] [address] [place], [place], [address] Phone: [phone]   Fax:  [phone]   Name: Garfield Coiner MRN: [id number] Date of Birth: [birth year]

## 2020-02-09 ENCOUNTER — Encounter: Payer: BC Managed Care – PPO | Admitting: Occupational Therapy

## 2020-02-09 ENCOUNTER — Ambulatory Visit: Payer: BC Managed Care – PPO

## 2020-02-12 ENCOUNTER — Ambulatory Visit: Payer: BC Managed Care – PPO

## 2020-02-12 ENCOUNTER — Ambulatory Visit: Payer: BC Managed Care – PPO | Admitting: Physical Therapy

## 2020-02-12 ENCOUNTER — Encounter: Payer: BC Managed Care – PPO | Admitting: Occupational Therapy

## 2020-02-12 ENCOUNTER — Telehealth: Payer: Self-pay

## 2020-02-12 ENCOUNTER — Encounter: Payer: Self-pay | Admitting: Physical Therapy

## 2020-02-12 ENCOUNTER — Other Ambulatory Visit: Payer: Self-pay

## 2020-02-12 VITALS — BP 123/71 | HR 85

## 2020-02-12 DIAGNOSIS — M6281 Muscle weakness (generalized): Secondary | ICD-10-CM

## 2020-02-12 DIAGNOSIS — R278 Other lack of coordination: Secondary | ICD-10-CM

## 2020-02-12 DIAGNOSIS — R1312 Dysphagia, oropharyngeal phase: Secondary | ICD-10-CM | POA: Diagnosis not present

## 2020-02-12 DIAGNOSIS — R2689 Other abnormalities of gait and mobility: Secondary | ICD-10-CM

## 2020-02-12 DIAGNOSIS — I69391 Dysphagia following cerebral infarction: Secondary | ICD-10-CM

## 2020-02-12 DIAGNOSIS — R2681 Unsteadiness on feet: Secondary | ICD-10-CM

## 2020-02-12 NOTE — Therapy (Signed)
[facility] [address] [place], [place], [address] Phone: [phone]   Fax:  [phone]  Speech Language Pathology Treatment  Patient Details  Name: Michael Bright MRN: [id number] Date of Birth: 08-29-1956 Referring Provider (SLP): Dr. Naaman Plummer   Encounter Date: 02/12/2020  End of Session - 02/12/20 1535    Visit Number  28    Number of Visits  40    Date for SLP Re-Evaluation  03/15/20    SLP Start Time  1450    SLP Stop Time   1528    SLP Time Calculation (min)  38 min    Activity Tolerance  Patient tolerated treatment well       Past Medical History:  Diagnosis Date  . Acute CVA (cerebrovascular accident) ([place]) 05/30/2019  . Acute kidney injury ([place])   . Acute respiratory failure ([place])   . Aspiration pneumonia ([place]) 06/14/2019  . 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 Diabetes ([place])   . 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 (CVA) 08/09/2019  . Pneumonia    aspiration pneumonia  . Sepsis with acute renal failure without septic shock ([place])   . Septic shock ([place])   . Stroke ([place])   . Urinary tract infection   . Urinary tract infection without hematuria     Past Surgical History:  Procedure Laterality Date  . CHOLECYSTECTOMY    . IR GASTROSTOMY TUBE MOD SED  07/04/2019    There were no vitals filed for this visit.  Subjective Assessment - 02/12/20 1501    Subjective  Pt brings in apple juice today - drinks prior to ST without any overt s/s aspiration.    Currently in Pain?  No/denies            ADULT SLP TREATMENT - 02/12/20 1502      General Information   Behavior/Cognition  Alert;Cooperative;Pleasant mood      Treatment Provided   Treatment provided  Dysphagia      Dysphagia Treatment   Temperature Spikes Noted  No    Respiratory Status  Room air    Treatment Methods  Therapeutic exercise;Skilled observation;Patient/caregiver education    Patient observed  directly with PO's  Yes    Type of PO's observed  Thin liquids    Feeding  Able to feed self    Liquids provided via  Cup    Pharyngeal Phase Signs & Symptoms  Audible swallow      Cognitive-Linquistic Treatment   Skilled Treatment  SLP placed NMES at placement 3b with 17.5 mA. Consistent audible swllow with and without PO. PO trials with apple juice - no overt s/s aspiration. Pt told SLP he did not perform oral care <30 minutes to ST (pt was in PT). Pt attempted a small piece of cheetoh ("not the crunchy kind"), and reported pharyngeal clearance. Pt has also attempted water in seated wihtout overt s/sx aspiration reported. Pt independent with all HEP. In last 20 swallows of 69 total, pt average 3-4 seconds trigger delay.  PO and non-PO swallows, in sets of 7-10 reps pt average overall was 7 seconds, in teh last 15 reps average delay was 9-10 seconds Pt cont to report EMST effort as 8/10.        Assessment / Recommendations / Plan   Plan  Continue with current plan of care   telephone encounter referring MD re: MBSS for pt     Dysphagia Recommendations   Diet recommendations  NPO   thin  in supine     Progression Toward Goals   Progression toward goals  Progressing toward goals         SLP Short Term Goals - 12/15/19 1510      SLP SHORT TERM GOAL #1   Title  Pt will complete dysphagia HEP with rare min A x 3 sessions.    Status  Partially Met      SLP SHORT TERM GOAL #2   Title  Patient will tolerate electrode placement for NMES x 3 sessions.    Baseline  12-05-19, 12-11-19    Status  Achieved      SLP SHORT TERM GOAL #3   Title  Patient will tell SLP 3 s/sx of aspiration PNA.    Status  Achieved       SLP Long Term Goals - 02/12/20 1536      SLP LONG TERM GOAL #1   Title  Pt will demonstrate dysphagia HEP with rare min A x3 sessions.    Baseline  02-05-20, 02-07-20, 02-12-20    Time  3    Period  Weeks   or 40 total sessions, for all LTGs   Status  On-going   and ongoing      SLP LONG TERM GOAL #2   Title  Pt will report thorough oral care prior to water/ice chips x6 sessions.    Baseline  11-29-19, 12-15-19 12-20-19, 12-28-19, 01-23-20    Status  Partially Met      SLP LONG TERM GOAL #3   Title  pt will tolerate electrode placement for 7 therapy sessions    Baseline  12-11-19, 12-15-19, 12-19-19, 12-20-19, 12-28-19,01-02-20, 01-03-20    Status  Achieved      SLP LONG TERM GOAL #4   Title  pt will swallow within average 5 seconds of bolus presentation to oral cavity 80% of the time, with at least 50 swallows    Baseline  02-12-20    Time  4    Period  Weeks    Status  On-going   and ongoing     SLP LONG TERM GOAL #5   Title  pt will follow any precautions provided after objective swallow assessments with rare min A over 3 sessions    Time  5    Period  Weeks    Status  On-going       Plan - 02/12/20 1535    Clinical Impression Statement  No hydrophonia present today.Pt continues PEG dependent. See "skilled treatment" for  more details. Pt has been having "a little" water each day but not in supine - without overt s/sx aspiration. Pt would benefit from follow up modified barium swallow exam/FEES this month; request sent to Dr. Naaman Plummer today. Pt reports he feels he is coughing less with PO trials at home than before his previous MBSS. He tells SLP he is continuing to take nectar trials at home and will take sips of regular water in supine after oral care.  I recommend cont'd skilled ST to address dysphagia for training in aspiration precautions, pharyngeal exercises, and  NMES to improve pt's swallow function for possible advancement to comfort feeds. Advise nectar thick liquid trials and (per MBSS) thin  liquids in supine as much as pt is comfortable.If after this next MBSS pt is still unable to initiate PO diet, recommend referral to [facility].    Speech Therapy Frequency  3x / week   or 25 total visits   Duration  --  8 weeks or 17 visits    Treatment/Interventions  Aspiration precaution training;Cueing hierarchy;SLP instruction and feedback;Pharyngeal strengthening exercises;NMES;Compensatory techniques;Diet toleration management by SLP;Trials of upgraded texture/liquids;Patient/family education    Potential to Achieve Goals  Good    Potential Considerations  Severity of impairments    SLP Home Exercise Plan  Continue swallow ex and RMT per [facility] SLP; bring exercises to next session    Consulted and Agree with Plan of Care  Patient;Family member/caregiver       Patient will benefit from skilled therapeutic intervention in order to improve the following deficits and impairments:   Oropharyngeal dysphagia    Problem List Patient Active Problem List   Diagnosis Date Noted  . Lower extremity edema 11/24/2019  . Difficulty urinating 11/24/2019  . Dupuytren's contracture of left hand 09/20/2019  . Hyperlipidemia 08/24/2019  . Upper and lower extremity pain 08/24/2019  . OSA on CPAP 08/09/2019  . Copious oral secretions 08/09/2019  . Anemia of chronic disease   . Dysphagia due to recent cerebral infarction   . Diabetes mellitus type 2 in obese ([place])   . Morbid obesity ([place])   . Status post insertion of percutaneous endoscopic gastrostomy (PEG) tube ([place])   . History of CVA (cerebrovascular accident) 06/27/2019  . Essential hypertension 06/14/2019  . Renal insufficiency 05/30/2019  . Diplopia   . Chronic fatigue     Tazaria Dlugosz ,MS, CCC-SLP  02/12/2020, 3:37 PM  [facility] [address] [place], [place], [address] Phone: [phone]   Fax:  [phone]   Name: Michael Bright MRN: [id number] Date of Birth: 06/14/1956

## 2020-02-12 NOTE — Telephone Encounter (Signed)
Dr. Riley Kill- Pt would benefit from follow up MBSS at this time. He reports taking small bites puree and some water without overt s/sx aspiration at home. Today, pt took sips thin without overt s/sx aspiration.  If agreed, please enter order via Epic or fax script to [phone].Thank you - please call/inbasket correspond with questions.  Verdie Mosher, SLP   [facility]

## 2020-02-12 NOTE — Telephone Encounter (Signed)
Order in for MBSS at [facility]. i'm sorry I didn't respond to your previous message! I just realized I had forgotten about it when I saw this one. My apologies!  ZTS

## 2020-02-12 NOTE — Therapy (Signed)
[facility] [address] [place], [place], [address] Phone: [phone]   Fax:  [phone]  Physical Therapy Treatment  Patient Details  Name: Michael Bright MRN: [id number] Date of Birth: [birth year] Referring Provider (PT): Alger Simons, MD   Encounter Date: 02/12/2020  PT End of Session - 02/12/20 1628    Visit Number  18    Number of Visits  31   per recert 7/62/8315   Date for PT Re-Evaluation  17/61/60   90 day recert for 60 day POC   Authorization Type  BCBS - 2021 primary, Medicare Secondary    PT Start Time  1403    PT Stop Time  1445    PT Time Calculation (min)  42 min    Equipment Utilized During Treatment  Gait belt    Activity Tolerance  Patient tolerated treatment well    Behavior During Therapy  [facility] for tasks assessed/performed       Past Medical History:  Diagnosis Date  . Acute CVA (cerebrovascular accident) ([place]) 05/30/2019  . Acute kidney injury ([place])   . Acute respiratory failure ([place])   . Aspiration pneumonia ([place]) 06/14/2019  . 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 Diabetes ([place])   . 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 (CVA) 08/09/2019  . Pneumonia    aspiration pneumonia  . Sepsis with acute renal failure without septic shock ([place])   . Septic shock ([place])   . Stroke ([place])   . Urinary tract infection   . Urinary tract infection without hematuria     Past Surgical History:  Procedure Laterality Date  . CHOLECYSTECTOMY    . IR GASTROSTOMY TUBE MOD SED  07/04/2019    Vitals:   02/12/20 1412  BP: 123/71  Pulse: 85    Subjective Assessment - 02/12/20 1407    Subjective  Has been doing more walking at home - about a block more. Exercises at home at the countertop are going well. Has been more consistently elevating his legs at home.    Pertinent History  PMH: CVA (L lateral medullary syndrome) diabetes, HTN, OSA, morbid obesity.    Limitations   Standing;Walking;House hold activities    How long can you stand comfortably?  can stand approx. 10 minutes    How long can you walk comfortably?  20 minutes with the rollator    Patient Stated Goals  wants to walk better, walk more steady, wants to be able to get up more easily.    Currently in Pain?  No/denies    Pain Onset  More than a month ago    Pain Onset  More than a month ago         [facility] PT Assessment - 02/12/20 1417      Timed Up and Go Test   Normal TUG (seconds)  18   with bariatric rollator                  [facility] Adult PT Treatment/Exercise - 02/12/20 1417      Transfers   Transfers  Sit to Stand;Stand to Sit    Sit to Stand Details  Verbal cues for precautions/safety;Verbal cues for technique    Stand to Sit Details  pt lacking eccentric control and still with later reps braces BLE against mat table (when standing from 20" surface)    Comments  from 20" mat surface with BUE support  (5x sit <> stand) 1st rep 26.8 seconds, 2nd rep  20.47 seconds. plus additional reps throughout session from mat table with and without UE support and slowly eccentric lowering to mat with no UE support      Ambulation/Gait   Ambulation/Gait  Yes    Ambulation/Gait Assistance  5: Supervision    Ambulation/Gait Assistance Details  cues for heel > toe pattern    Ambulation Distance (Feet)  230 Feet   approx. throughout session   Assistive device  Rollator   bariatric   Gait Pattern  Step-through pattern;Decreased dorsiflexion - right    Ambulation Surface  Level;Indoor    Gait velocity  13.97 seconds = 2.35 ft/sec       Therapeutic Activites    Therapeutic Activities  Other Therapeutic Activities    Other Therapeutic Activities  therapist re-assessed pt's ankle swelling B - with pt still having mild pitting edema, continued to educate on elevating BLEs and performing ankle pumps with pt verbalizing understanding      Neuro Re-ed    Neuro Re-ed Details   In // bars on  blue foam: lateral stepping strategy x10 reps B progressing from BUE support to single UE support with cues to slow down movement and incr foot clearance Feet hip width on foam: 2 x 10 reps head turns, 2 x 10 reps head nods (pt with incr difficulty with head nods - needing min guard/min A at times for balance).       Knee/Hip Exercises: Aerobic   Other Aerobic  SciFit with BLE and BUE for strengthening, endurance, activity tolerance x6 minutes at gear 3.5, HR = 116 bpm after activity              PT Education - 02/12/20 1628    Education Details  progress towards goals, continue with elevating BLE to decr swelling    Person(s) Educated  Patient    Methods  Explanation    Comprehension  Verbalized understanding       PT Short Term Goals - 02/12/20 1423      PT SHORT TERM GOAL #1   Title  Patient will be independent with progression of HEP in order to build upon functional gains made in therapy.  ALL STGS DUE 02/16/2020 (4 weeks-may be delayed due to scheduling)    Time  4    Period  Weeks    Status  On-going      PT SHORT TERM GOAL #2   Title  Patient to improve BERG balance test to at least 42/56 for decreased fall risk.    Baseline  35/56 on 12/13/19, 38/56 on Berg on 01/09/20; 38/56 01/11/2020    Time  4    Period  Weeks    Status  Revised      PT SHORT TERM GOAL #3   Title  Patient will decrease TUG time with bariatric rollator to 15 seconds of less to demo decr fall risk.    Baseline  30.22 seconds on 10/31/19; 25.93 sec 12/11/2019; 18.97 sec with rollator 01/11/2020, 18 seconds with rollator on 02/12/20    Time  4    Period  Weeks    Status  Not Met      PT SHORT TERM GOAL #4   Title  Patient will improve gait velocity with bariatric rollator to at least 2.62 ft/sec to demo a limited community ambulator and decr fall risk.    Baseline  1.1 ft/sec with L AFO and bariatric rollator; 1.6 ft/sec 12/11/2019; 2.54 f/tsec 01/11/2020,  2.35 ft/sec on 02/12/20  Time  4    Period   Weeks    Status  Not Met      PT SHORT TERM GOAL #5   Title  Patient will decrease 5x sit <> stand time to 23 seconds or less from  mat table with UE support in order to demo improved functional LE strength.    Baseline  31.06 seconds; 27.19 sec 12/11/2019; 26.32 sec 01/11/2020 (from 20" mat surface), 20.47 seconds on 02/12/20 (from 20" mat surface)    Time  4    Period  Weeks    Status  Achieved        PT Long Term Goals - 02/12/20 1631      PT LONG TERM GOAL #1   Title  Patient will be independent with final HEP in order to build upon functional gains made in therapy. ALL LTGS DUE 03/15/2020 (8 weeks-may be delayed due to scheduling)    Time  8    Period  Weeks    Status  Revised      PT LONG TERM GOAL #2   Title  Berg score to imporve to at least 45/56 for decreased fall risk.    Time  8    Period  Weeks    Status  Revised      PT LONG TERM GOAL #3   Title  Patient will decrease 5x sit <> stand time to 18 seconds of less from elevated mat table (20" height) vs. standard chair with UE support in order to demo improved functional LE strength.    Baseline  20.47 seconds on 02/12/20 (from 20" mat surface    Time  8    Period  Weeks    Status  Revised      PT LONG TERM GOAL #4   Title  Patient will improve gait velocity with least restrictive assistive device to at least 2.6 ft/sec to demo improved community mobility.    Time  8    Period  Weeks    Status  Revised      PT LONG TERM GOAL #5   Title  Patient will decrease TUG time with least restrictive assistive device to 17 seconds of less to demo decr fall risk.    Baseline  18 seconds with rollator on 02/12/20    Time  8    Period  Weeks    Status  Revised      PT LONG TERM GOAL #6   Title  Patient will ambulate at least 300' with supervision over level indoor and outdoor surfaces with LRAD  with supervision in order to improve community mobility.    Time  8    Period  Weeks    Status  On-going            Plan -  02/12/20 1631    Clinical Impression Statement  Focus of today's skilled session was beginning to check pt's STGs. Pt met LTG #5 in regards to 5x sit <> stand - able to perform in 20.47 seconds today after 2nd attempt from a lower 20" mat table with BUE support. Pt did not meet goal in regards to gait speed - pt's gait speed today was 2.35 ft/sec indicating that pt is still a limited community ambulator with rollator. Pt decr his TUG time to 18 seconds, however note quite yet to goal to decrease fall risk. Will check remaining STGs at next session. Revised LTGs as appropriate. Pt most challenged today with balance on  foam with head nods up and down. Will continue to progress towards LTGs.    Personal Factors and Comorbidities  Comorbidity 3+;Past/Current Experience;Time since onset of injury/illness/exacerbation    Comorbidities  diabetes, HTN, OSA, morbid obesity.    Examination-Activity Limitations  Squat;Transfers;Locomotion Level;Stairs    Examination-Participation Restrictions  Community Activity    Stability/Clinical Decision Making  Evolving/Moderate complexity    Rehab Potential  Good    PT Frequency  2x / week    PT Duration  8 weeks   per recert 8/36/7255   PT Treatment/Interventions  ADLs/Self Care Home Management;Balance training;Therapeutic exercise;Therapeutic activities;Functional mobility training;Stair training;Gait training;DME Instruction;Aquatic Therapy;Neuromuscular re-education;Patient/family education;Orthotic Fit/Training;Energy conservation    PT Next Visit Plan  check remaining STGs. how is ankle swelling/edema doing? may want to measurewith tape measure  just to further assess. continue strength, balance strategies, compliant surfaces, use of aerobic machines and parallel bars for strength/balance    PT Home Exercise Plan  [id number] ([place])    Consulted and Agree with Plan of Care  Patient       Patient will benefit from skilled therapeutic intervention in order to  improve the following deficits and impairments:  Abnormal gait, Decreased activity tolerance, Decreased balance, Decreased coordination, Decreased range of motion, Decreased knowledge of use of DME, Decreased knowledge of precautions, Decreased endurance, Decreased strength, Difficulty walking, Postural dysfunction, Impaired sensation, Obesity, Pain  Visit Diagnosis: Other abnormalities of gait and mobility  Muscle weakness (generalized)  Other lack of coordination  Unsteadiness on feet     Problem List Patient Active Problem List   Diagnosis Date Noted  . Lower extremity edema 11/24/2019  . Difficulty urinating 11/24/2019  . Dupuytren's contracture of left hand 09/20/2019  . Hyperlipidemia 08/24/2019  . Upper and lower extremity pain 08/24/2019  . OSA on CPAP 08/09/2019  . Copious oral secretions 08/09/2019  . Anemia of chronic disease   . Dysphagia due to recent cerebral infarction   . Diabetes mellitus type 2 in obese ([place])   . Morbid obesity ([place])   . Status post insertion of percutaneous endoscopic gastrostomy (PEG) tube ([place])   . History of CVA (cerebrovascular accident) 06/27/2019  . Essential hypertension 06/14/2019  . Renal insufficiency 05/30/2019  . Diplopia   . Chronic fatigue     Arliss Journey , PT, DPT  02/12/2020, 4:34 PM  [facility] [address] [place], [place], [address] Phone: [phone]   Fax:  [phone]  Name: Michael Bright MRN: [id number] Date of Birth: 03/09/1956

## 2020-02-14 ENCOUNTER — Ambulatory Visit: Payer: BC Managed Care – PPO

## 2020-02-14 ENCOUNTER — Encounter: Payer: BC Managed Care – PPO | Admitting: Occupational Therapy

## 2020-02-14 ENCOUNTER — Other Ambulatory Visit: Payer: Self-pay

## 2020-02-14 DIAGNOSIS — R2689 Other abnormalities of gait and mobility: Secondary | ICD-10-CM

## 2020-02-14 DIAGNOSIS — M6281 Muscle weakness (generalized): Secondary | ICD-10-CM

## 2020-02-14 DIAGNOSIS — R1312 Dysphagia, oropharyngeal phase: Secondary | ICD-10-CM

## 2020-02-14 NOTE — Therapy (Signed)
[facility] [address] [place], [place], [address] Phone: [phone]   Fax:  [phone]  Speech Language Pathology Treatment  Patient Details  Name: Michael Bright MRN: [id number] Date of Birth: 07-30-1956 Referring Provider (SLP): Dr. Naaman Plummer   Encounter Date: 02/14/2020  End of Session - 02/14/20 2348    Visit Number  29    Number of Visits  40    Date for SLP Re-Evaluation  03/15/20    SLP Start Time  1406    SLP Stop Time   1445    SLP Time Calculation (min)  39 min    Activity Tolerance  Patient tolerated treatment well       Past Medical History:  Diagnosis Date  . Acute CVA (cerebrovascular accident) ([place]) 05/30/2019  . Acute kidney injury ([place])   . Acute respiratory failure ([place])   . Aspiration pneumonia ([place]) 06/14/2019  . 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 Diabetes ([place])   . 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 (CVA) 08/09/2019  . Pneumonia    aspiration pneumonia  . Sepsis with acute renal failure without septic shock ([place])   . Septic shock ([place])   . Stroke ([place])   . Urinary tract infection   . Urinary tract infection without hematuria     Past Surgical History:  Procedure Laterality Date  . CHOLECYSTECTOMY    . IR GASTROSTOMY TUBE MOD SED  07/04/2019    There were no vitals filed for this visit.  Subjective Assessment - 02/14/20 1435    Subjective  Pt brings in apple juice today - drank prior to ST without any overt s/s aspiration.    Currently in Pain?  No/denies            ADULT SLP TREATMENT - 02/14/20 1440      General Information   Behavior/Cognition  Alert;Cooperative;Pleasant mood      Treatment Provided   Treatment provided  Dysphagia      Dysphagia Treatment   Temperature Spikes Noted  No    Respiratory Status  Room air    Treatment Methods  Therapeutic exercise;Skilled observation;Patient/caregiver education    Patient observed  directly with PO's  Yes    Type of PO's observed  Thin liquids    Feeding  Able to feed self    Liquids provided via  Cup    Pharyngeal Phase Signs & Symptoms  Audible swallow      Cognitive-Linquistic Treatment   Skilled Treatment  SLP placed NMES at placement 3a with 17.5 mA. Consistent audible swllow with and without PO. PO trials with apple juice - no overt s/s aspiration. Pt told SLP he did not perform oral care <30 minutes to ST (pt was in PT). Pt independent with all HEP. In last 15 swallows of 53 total, pt average 5-6 seconds trigger delay without PO.  PO swallows, in sets of 5-7 reps pt average overall was 3 seconds. Pt cont to report EMST effort as 8/10.        Assessment / Recommendations / Plan   Plan  Continue with current plan of care   per referring MD, f/u MBSS orders placed     Dysphagia Recommendations   Diet recommendations  NPO   ice chips and water in supine     Progression Toward Goals   Progression toward goals  Progressing toward goals         SLP Short Term Goals - 12/15/19 1510  SLP SHORT TERM GOAL #1   Title  Pt will complete dysphagia HEP with rare min A x 3 sessions.    Status  Partially Met      SLP SHORT TERM GOAL #2   Title  Patient will tolerate electrode placement for NMES x 3 sessions.    Baseline  12-05-19, 12-11-19    Status  Achieved      SLP SHORT TERM GOAL #3   Title  Patient will tell SLP 3 s/sx of aspiration PNA.    Status  Achieved       SLP Long Term Goals - 02/14/20 2349      SLP LONG TERM GOAL #1   Title  Pt will demonstrate dysphagia HEP with rare min A x3 sessions.    Baseline  02-05-20, 02-07-20, 02-12-20    Period  --   or 40 total sessions, for all LTGs   Status  Achieved   and ongoing     SLP LONG TERM GOAL #2   Title  Pt will report thorough oral care prior to water/ice chips x6 sessions.    Baseline  11-29-19, 12-15-19 12-20-19, 12-28-19, 01-23-20    Status  Partially Met      SLP LONG TERM GOAL #3   Title  pt will  tolerate electrode placement for 7 therapy sessions    Baseline  12-11-19, 12-15-19, 12-19-19, 12-20-19, 12-28-19,01-02-20, 01-03-20    Status  Achieved      SLP LONG TERM GOAL #4   Title  pt will swallow within average 5 seconds of bolus presentation to oral cavity 80% of the time, with at least 50 swallows    Baseline  02-12-20    Time  4    Period  Weeks    Status  On-going   and ongoing     SLP LONG TERM GOAL #5   Title  pt will follow any precautions provided after objective swallow assessments with rare min A over 3 sessions    Time  5    Period  Weeks    Status  On-going       Plan - 02/14/20 2348    Clinical Impression Statement  No hydrophonia present today.Pt continues PEG dependent. See "skilled treatment" for  more details. Pt has been having "a little" water each day but not in supine - without overt s/sx aspiration. According to referring MD, a follow up modified barium swallow exam/FEES this month has been ordered. Pt reports he feels he is coughing less with PO trials at home than before his previous MBSS. He tells SLP he is continuing to take nectar trials at home and will take sips of regular water in supine after oral care.  I recommend cont'd skilled ST to address dysphagia for training in aspiration precautions, pharyngeal exercises, and  NMES to improve pt's swallow function for possible advancement to comfort feeds. Advise nectar thick liquid trials and (per MBSS) thin  liquids in supine as much as pt is comfortable.If after this next MBSS pt is still unable to initiate PO diet, recommend referral to [facility].    Speech Therapy Frequency  3x / week   or 25 total visits   Duration  --   8 weeks or 17 visits   Treatment/Interventions  Aspiration precaution training;Cueing hierarchy;SLP instruction and feedback;Pharyngeal strengthening exercises;NMES;Compensatory techniques;Diet toleration management by SLP;Trials of upgraded texture/liquids;Patient/family  education    Potential to Achieve Goals  Good    Potential Considerations  Severity  of impairments    SLP Home Exercise Plan  Continue swallow ex and RMT per [facility] SLP; bring exercises to next session    Consulted and Agree with Plan of Care  Patient;Family member/caregiver       Patient will benefit from skilled therapeutic intervention in order to improve the following deficits and impairments:   Oropharyngeal dysphagia    Problem List Patient Active Problem List   Diagnosis Date Noted  . Lower extremity edema 11/24/2019  . Difficulty urinating 11/24/2019  . Dupuytren's contracture of left hand 09/20/2019  . Hyperlipidemia 08/24/2019  . Upper and lower extremity pain 08/24/2019  . OSA on CPAP 08/09/2019  . Copious oral secretions 08/09/2019  . Anemia of chronic disease   . Dysphagia due to recent cerebral infarction   . Diabetes mellitus type 2 in obese ([place])   . Morbid obesity ([place])   . Status post insertion of percutaneous endoscopic gastrostomy (PEG) tube ([place])   . History of CVA (cerebrovascular accident) 06/27/2019  . Essential hypertension 06/14/2019  . Renal insufficiency 05/30/2019  . Diplopia   . Chronic fatigue     Erionna Strum ,MS, CCC-SLP  02/14/2020, 11:50 PM  [facility] [address] [place], [place], [address] Phone: [phone]   Fax:  [phone]   Name: Michael Bright MRN: [id number] Date of Birth: June 09, 1956

## 2020-02-14 NOTE — Therapy (Signed)
[facility] [address] [place], [place], [address] Phone: [phone]   Fax:  [phone]  Physical Therapy Treatment  Patient Details  Name: Michael Bright MRN: [id number] Date of Birth: 02-Jan-1956 Referring Provider (PT): Alger Simons, MD   Encounter Date: 02/14/2020  PT End of Session - 02/14/20 1322    Visit Number  19    Number of Visits  31   per recert 6/96/2952   Date for PT Re-Evaluation  84/13/24   90 day recert for 60 day POC   Authorization Type  BCBS - 2021 primary, Medicare Secondary    PT Start Time  1320    PT Stop Time  1358    PT Time Calculation (min)  38 min    Equipment Utilized During Treatment  Gait belt    Activity Tolerance  Patient tolerated treatment well    Behavior During Therapy  WFL for tasks assessed/performed       Past Medical History:  Diagnosis Date  . Acute CVA (cerebrovascular accident) ([place]) 05/30/2019  . Acute kidney injury ([place])   . Acute respiratory failure ([place])   . Aspiration pneumonia ([place]) 06/14/2019  . 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 Diabetes ([place])   . 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 (CVA) 08/09/2019  . Pneumonia    aspiration pneumonia  . Sepsis with acute renal failure without septic shock ([place])   . Septic shock ([place])   . Stroke ([place])   . Urinary tract infection   . Urinary tract infection without hematuria     Past Surgical History:  Procedure Laterality Date  . CHOLECYSTECTOMY    . IR GASTROSTOMY TUBE MOD SED  07/04/2019    There were no vitals filed for this visit.  Subjective Assessment - 02/14/20 1739    Subjective  Pt reports he has been doing well. Has been walking more at home about 3 blocks.    Pertinent History  PMH: CVA (L lateral medullary syndrome) diabetes, HTN, OSA, morbid obesity.    Limitations  Standing;Walking;House hold activities    How long can you stand comfortably?  can stand approx. 10  minutes    How long can you walk comfortably?  20 minutes with the rollator    Patient Stated Goals  wants to walk better, walk more steady, wants to be able to get up more easily.    Currently in Pain?  No/denies    Pain Onset  More than a month ago    Pain Onset  More than a month ago         [facility] PT Assessment - 02/14/20 1324      Observation/Other Assessments-Edema    Edema  --   ankle circumference: R=29 cm and L=29.5cm                  [facility] Adult PT Treatment/Exercise - 02/14/20 1324      Transfers   Transfers  Sit to Stand;Stand to Sit    Sit to Stand  5: Supervision    Sit to Stand Details  Verbal cues for precautions/safety;Verbal cues for technique    Stand to Sit  5: Supervision      Ambulation/Gait   Ambulation/Gait  Yes    Ambulation/Gait Assistance  5: Supervision    Ambulation/Gait Assistance Details  Pt ambulated inside 230' with 115' with head turns left/right/up/down on command and 115' with speed changes on command. Pt had no LOB with this. Ambulated  outside with verbal cues to increase right foot clearance at times especially notable when he could hear a scuff. Pt had decreased right foot clearance more notable towards end as he fatigued.    Ambulation Distance (Feet)  1150 Feet    Assistive device  Rollator    Gait Pattern  Step-through pattern;Decreased dorsiflexion - right    Ambulation Surface  Level;Unlevel;Indoor;Outdoor;Paved;Grass    Curb  5: Supervision    Curb Details (indicate cue type and reason)  Pt was given cues for sequencing with rollator on curb      Standardized Balance Assessment   Standardized Balance Assessment  Berg Balance Test      Berg Balance Test   Sit to Stand  Able to stand  independently using hands    Standing Unsupported  Able to stand safely 2 minutes    Sitting with Back Unsupported but Feet Supported on Floor or Stool  Able to sit safely and securely 2 minutes    Stand to Sit  Sits safely with minimal use  of hands    Transfers  Able to transfer safely, minor use of hands    Standing Unsupported with Eyes Closed  Able to stand 10 seconds safely    Standing Ubsupported with Feet Together  Able to place feet together independently and stand 1 minute safely    From Standing, Reach Forward with Outstretched Arm  Can reach confidently >25 cm (10")    From Standing Position, Pick up Object from Floor  Able to pick up shoe safely and easily    From Standing Position, Turn to Look Behind Over each Shoulder  Turn sideways only but maintains balance    Turn 360 Degrees  Able to turn 360 degrees safely but slowly    Standing Unsupported, Alternately Place Feet on Step/Stool  Able to complete >2 steps/needs minimal assist    Standing Unsupported, One Foot in Front  Able to plae foot ahead of the other independently and hold 30 seconds    Standing on One Leg  Tries to lift leg/unable to hold 3 seconds but remains standing independently    Total Score  44             PT Education - 02/14/20 1742    Education Details  Pt to continue with current HEP    Person(s) Educated  Patient    Methods  Explanation    Comprehension  Verbalized understanding       PT Short Term Goals - 02/14/20 1327      PT SHORT TERM GOAL #1   Title  Patient will be independent with progression of HEP in order to build upon functional gains made in therapy.  ALL STGS DUE 02/16/2020 (4 weeks-may be delayed due to scheduling)    Baseline  Pt reports that exercises are going pretty good. Doing most days. Has been walking more outside.    Time  4    Period  Weeks    Status  Achieved      PT SHORT TERM GOAL #2   Title  Patient to improve BERG balance test to at least 42/56 for decreased fall risk.    Baseline  35/56 on 12/13/19, 38/56 on Berg on 01/09/20; 38/56 01/11/2020, 02/14/20 44/56    Time  4    Period  Weeks    Status  Achieved      PT SHORT TERM GOAL #3   Title  Patient will decrease TUG time with bariatric rollator  to 15 seconds of less to demo decr fall risk.    Baseline  30.22 seconds on 10/31/19; 25.93 sec 12/11/2019; 18.97 sec with rollator 01/11/2020, 18 seconds with rollator on 02/12/20    Time  4    Period  Weeks    Status  Not Met      PT SHORT TERM GOAL #4   Title  Patient will improve gait velocity with bariatric rollator to at least 2.62 ft/sec to demo a limited community ambulator and decr fall risk.    Baseline  1.1 ft/sec with L AFO and bariatric rollator; 1.6 ft/sec 12/11/2019; 2.54 f/tsec 01/11/2020,  2.35 ft/sec on 02/12/20    Time  4    Period  Weeks    Status  Not Met      PT SHORT TERM GOAL #5   Title  Patient will decrease 5x sit <> stand time to 23 seconds or less from  mat table with UE support in order to demo improved functional LE strength.    Baseline  31.06 seconds; 27.19 sec 12/11/2019; 26.32 sec 01/11/2020 (from 20" mat surface), 20.47 seconds on 02/12/20 (from 20" mat surface)    Time  4    Period  Weeks    Status  Achieved        PT Long Term Goals - 02/12/20 1631      PT LONG TERM GOAL #1   Title  Patient will be independent with final HEP in order to build upon functional gains made in therapy. ALL LTGS DUE 03/15/2020 (8 weeks-may be delayed due to scheduling)    Time  8    Period  Weeks    Status  Revised      PT LONG TERM GOAL #2   Title  Berg score to imporve to at least 45/56 for decreased fall risk.    Time  8    Period  Weeks    Status  Revised      PT LONG TERM GOAL #3   Title  Patient will decrease 5x sit <> stand time to 18 seconds of less from elevated mat table (20" height) vs. standard chair with UE support in order to demo improved functional LE strength.    Baseline  20.47 seconds on 02/12/20 (from 20" mat surface    Time  8    Period  Weeks    Status  Revised      PT LONG TERM GOAL #4   Title  Patient will improve gait velocity with least restrictive assistive device to at least 2.6 ft/sec to demo improved community mobility.    Time  8     Period  Weeks    Status  Revised      PT LONG TERM GOAL #5   Title  Patient will decrease TUG time with least restrictive assistive device to 17 seconds of less to demo decr fall risk.    Baseline  18 seconds with rollator on 02/12/20    Time  8    Period  Weeks    Status  Revised      PT LONG TERM GOAL #6   Title  Patient will ambulate at least 300' with supervision over level indoor and outdoor surfaces with LRAD  with supervision in order to improve community mobility.    Time  8    Period  Weeks    Status  On-going            Plan - 02/14/20  1746    Clinical Impression Statement  Pt continues to show progress. Met Merrilee Jansky goal today increasing to 44/56 showing improving balance but still fall risk and needs a walker for safety. Pt was able to increase gait distance outside. Measure ankle circumference for baseline. Does have +2 pitting bilateral ankles. Pt reports he has been elevating legs more and seems better than it was.    Personal Factors and Comorbidities  Comorbidity 3+;Past/Current Experience;Time since onset of injury/illness/exacerbation    Comorbidities  diabetes, HTN, OSA, morbid obesity.    Examination-Activity Limitations  Squat;Transfers;Locomotion Level;Stairs    Examination-Participation Restrictions  Community Activity    Stability/Clinical Decision Making  Evolving/Moderate complexity    Rehab Potential  Good    PT Frequency  2x / week    PT Duration  8 weeks   per recert 3/90/3009   PT Treatment/Interventions  ADLs/Self Care Home Management;Balance training;Therapeutic exercise;Therapeutic activities;Functional mobility training;Stair training;Gait training;DME Instruction;Aquatic Therapy;Neuromuscular re-education;Patient/family education;Orthotic Fit/Training;Energy conservation    PT Next Visit Plan  10th visit progress note next visit. continue strength, balance strategies, compliant surfaces, use of aerobic machines and parallel bars for strength/balance     PT Home Exercise Plan  [id number] ([place])    Consulted and Agree with Plan of Care  Patient       Patient will benefit from skilled therapeutic intervention in order to improve the following deficits and impairments:  Abnormal gait, Decreased activity tolerance, Decreased balance, Decreased coordination, Decreased range of motion, Decreased knowledge of use of DME, Decreased knowledge of precautions, Decreased endurance, Decreased strength, Difficulty walking, Postural dysfunction, Impaired sensation, Obesity, Pain  Visit Diagnosis: Other abnormalities of gait and mobility  Muscle weakness (generalized)     Problem List Patient Active Problem List   Diagnosis Date Noted  . Lower extremity edema 11/24/2019  . Difficulty urinating 11/24/2019  . Dupuytren's contracture of left hand 09/20/2019  . Hyperlipidemia 08/24/2019  . Upper and lower extremity pain 08/24/2019  . OSA on CPAP 08/09/2019  . Copious oral secretions 08/09/2019  . Anemia of chronic disease   . Dysphagia due to recent cerebral infarction   . Diabetes mellitus type 2 in obese ([place])   . Morbid obesity ([place])   . Status post insertion of percutaneous endoscopic gastrostomy (PEG) tube ([place])   . History of CVA (cerebrovascular accident) 06/27/2019  . Essential hypertension 06/14/2019  . Renal insufficiency 05/30/2019  . Diplopia   . Chronic fatigue     Electa Sniff, PT, DPT, NCS 02/14/2020, 5:49 PM  [facility] [address] [place], [place], [address] Phone: [phone]   Fax:  [phone]  Name: Sevan Mcbroom MRN: [id number] Date of Birth: 02-04-1956

## 2020-02-16 ENCOUNTER — Ambulatory Visit: Payer: BC Managed Care – PPO

## 2020-02-16 ENCOUNTER — Other Ambulatory Visit: Payer: Self-pay

## 2020-02-16 DIAGNOSIS — R1312 Dysphagia, oropharyngeal phase: Secondary | ICD-10-CM | POA: Diagnosis not present

## 2020-02-16 NOTE — Therapy (Signed)
[facility] [address] [place], [place], [address] Phone: [phone]   Fax:  [phone]  Speech Language Pathology Treatment/Progress Note  Patient Details  Name: Michael Bright MRN: [id number] Date of Birth: 1956/10/06 Referring Provider (SLP): Dr. Naaman Plummer   Encounter Date: 02/16/2020  End of Session - 02/16/20 1730    Visit Number  30    Number of Visits  40    Date for SLP Re-Evaluation  03/15/20    SLP Start Time  8786    SLP Stop Time   1609    SLP Time Calculation (min)  36 min    Activity Tolerance  Patient tolerated treatment well       Past Medical History:  Diagnosis Date  . Acute CVA (cerebrovascular accident) ([place]) 05/30/2019  . Acute kidney injury ([place])   . Acute respiratory failure ([place])   . Aspiration pneumonia ([place]) 06/14/2019  . 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 Diabetes ([place])   . 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 (CVA) 08/09/2019  . Pneumonia    aspiration pneumonia  . Sepsis with acute renal failure without septic shock ([place])   . Septic shock ([place])   . Stroke ([place])   . Urinary tract infection   . Urinary tract infection without hematuria     Past Surgical History:  Procedure Laterality Date  . CHOLECYSTECTOMY    . IR GASTROSTOMY TUBE MOD SED  07/04/2019    There were no vitals filed for this visit.  Subjective Assessment - 02/16/20 1726    Subjective  Pt again brings in apple juice today - drank prior to ST without any overt s/s aspiration.    Currently in Pain?  No/denies            ADULT SLP TREATMENT - 02/16/20 0001      General Information   Behavior/Cognition  Alert;Cooperative;Pleasant mood      Dysphagia Treatment   Temperature Spikes Noted  No    Respiratory Status  Room air    Treatment Methods  Therapeutic exercise;Skilled observation;Patient/caregiver education    Patient observed directly with PO's  Yes    Type of PO's  observed  Thin liquids    Feeding  Able to feed self    Liquids provided via  Cup    Pharyngeal Phase Signs & Symptoms  Audible swallow      Cognitive-Linquistic Treatment   Skilled Treatment  NMES was placed by SLP with placement 3b at 17.5 mA. Consistent audible swllow with and without PO. PO trials with apple juice - pt ocughed x2 stated due to allergies - once was simultneous with swallow and one was just after CTAR exercise. Pt independent with all HEP. In last 10 swallows of 57 total, pt average 6 seconds trigger delay without PO.  With PO, swallows in sets of 5 reps pt average delay overall was 3 seconds. Pt cont to report EMST effort as 8/10.        Assessment / Recommendations / Plan   Plan  Continue with current plan of care   acute office manager emailed-pt not been called to schedule      Dysphagia Recommendations   Diet recommendations  NPO      Progression Toward Goals   Progression toward goals  Progressing toward goals         SLP Short Term Goals - 12/15/19 1510      SLP SHORT TERM GOAL #1   Title  Pt will complete dysphagia HEP with rare min A x 3 sessions.    Status  Partially Met      SLP SHORT TERM GOAL #2   Title  Patient will tolerate electrode placement for NMES x 3 sessions.    Baseline  12-05-19, 12-11-19    Status  Achieved      SLP SHORT TERM GOAL #3   Title  Patient will tell SLP 3 s/sx of aspiration PNA.    Status  Achieved       SLP Long Term Goals - 02/16/20 1731      SLP LONG TERM GOAL #1   Title  Pt will demonstrate dysphagia HEP with rare min A x3 sessions.    Baseline  02-05-20, 02-07-20, 02-12-20    Period  --   or 40 total sessions, for all LTGs   Status  Achieved   and ongoing     SLP LONG TERM GOAL #2   Title  Pt will report thorough oral care prior to water/ice chips x6 sessions.    Baseline  11-29-19, 12-15-19 12-20-19, 12-28-19, 01-23-20    Status  Partially Met      SLP LONG TERM GOAL #3   Title  pt will tolerate electrode  placement for 7 therapy sessions    Baseline  12-11-19, 12-15-19, 12-19-19, 12-20-19, 12-28-19,01-02-20, 01-03-20    Status  Achieved      SLP LONG TERM GOAL #4   Title  pt will swallow within average 5 seconds of bolus presentation to oral cavity 80% of the time, with at least 50 swallows    Baseline  02-12-20, 02-16-20    Time  4    Period  Weeks    Status  On-going   and ongoing     SLP LONG TERM GOAL #5   Title  pt will follow any precautions provided after objective swallow assessments with rare min A over 3 sessions    Time  5    Period  Weeks    Status  On-going       Plan - 02/16/20 1730    Clinical Impression Statement  No hydrophonia present today.Pt continues PEG dependent. See "skilled treatment" for  more details. Pt has been having "a little" water each day but not in supine - without overt s/sx aspiration. According to referring MD, a follow up modified barium swallow exam/FEES this month has been ordered. Pt reports he feels he is coughing less with PO trials at home than before his previous MBSS. He tells SLP he is continuing to take nectar trials at home and will take sips of regular water in supine after oral care.  I recommend cont'd skilled ST to address dysphagia for training in aspiration precautions, pharyngeal exercises, and  NMES to improve pt's swallow function for possible advancement to comfort feeds. Advise nectar thick liquid trials and (per MBSS) thin  liquids in supine as much as pt is comfortable.If after this next MBSS pt is still unable to initiate PO diet, recommend referral to [facility].    Speech Therapy Frequency  3x / week   or 25 total visits   Duration  --   8 weeks or 17 visits   Treatment/Interventions  Aspiration precaution training;Cueing hierarchy;SLP instruction and feedback;Pharyngeal strengthening exercises;NMES;Compensatory techniques;Diet toleration management by SLP;Trials of upgraded texture/liquids;Patient/family education     Potential to Achieve Goals  Good    Potential Considerations  Severity of impairments    SLP Home Exercise  Plan  Continue swallow ex and RMT per [facility] SLP; bring exercises to next session    Consulted and Agree with Plan of Care  Patient;Family member/caregiver       Patient will benefit from skilled therapeutic intervention in order to improve the following deficits and impairments:   Oropharyngeal dysphagia   Speech Therapy Progress Note  Dates of Reporting Period: 01-11-20 to present  Subjective Statement: Pt seen for 30 visits of skilled ST addressing his dysphagia.  Objective Measurements: Fatigue plays a small role in pt success. He is awaiting scheduling for a follow up modified (MBSS).   Goal Update: See above.  Plan: TBD after latest MBSS  Reason Skilled Services are Required: TBD following latest MBSS.   Problem List Patient Active Problem List   Diagnosis Date Noted  . Lower extremity edema 11/24/2019  . Difficulty urinating 11/24/2019  . Dupuytren's contracture of left hand 09/20/2019  . Hyperlipidemia 08/24/2019  . Upper and lower extremity pain 08/24/2019  . OSA on CPAP 08/09/2019  . Copious oral secretions 08/09/2019  . Anemia of chronic disease   . Dysphagia due to recent cerebral infarction   . Diabetes mellitus type 2 in obese ([place])   . Morbid obesity ([place])   . Status post insertion of percutaneous endoscopic gastrostomy (PEG) tube ([place])   . History of CVA (cerebrovascular accident) 06/27/2019  . Essential hypertension 06/14/2019  . Renal insufficiency 05/30/2019  . Diplopia   . Chronic fatigue     Mayu Ronk ,MS, CCC-SLP  02/16/2020, 5:32 PM  [facility] [address] [place], [place], [address] Phone: [phone]   Fax:  [phone]   Name: Michael Bright MRN: [id number] Date of Birth: 1955/11/25

## 2020-02-19 ENCOUNTER — Other Ambulatory Visit (HOSPITAL_COMMUNITY): Payer: Self-pay | Admitting: *Deleted

## 2020-02-19 DIAGNOSIS — R1310 Dysphagia, unspecified: Secondary | ICD-10-CM

## 2020-02-20 ENCOUNTER — Other Ambulatory Visit: Payer: Self-pay

## 2020-02-20 ENCOUNTER — Ambulatory Visit: Payer: BC Managed Care – PPO | Attending: Physical Medicine & Rehabilitation

## 2020-02-20 DIAGNOSIS — R2681 Unsteadiness on feet: Secondary | ICD-10-CM | POA: Insufficient documentation

## 2020-02-20 DIAGNOSIS — R278 Other lack of coordination: Secondary | ICD-10-CM | POA: Diagnosis present

## 2020-02-20 DIAGNOSIS — M6281 Muscle weakness (generalized): Secondary | ICD-10-CM | POA: Insufficient documentation

## 2020-02-20 DIAGNOSIS — R2689 Other abnormalities of gait and mobility: Secondary | ICD-10-CM | POA: Diagnosis present

## 2020-02-20 NOTE — Therapy (Signed)
[facility] [address] [place], [place], [address] Phone: [phone]   Fax:  [phone]  Physical Therapy Treatment/10th visit progress note  Patient Details  Name: Michael Bright MRN: [id number] Date of Birth: 1956/07/12 Referring Provider (PT): Alger Simons, MD    Progress Note  Reporting period 12/22/19 to 02/20/20  See Note below for Objective Data and Assessment of Progress/Goals   Encounter Date: 02/20/2020  PT End of Session - 02/20/20 1531    Visit Number  20    Number of Visits  31   per recert 8/93/7342   Date for PT Re-Evaluation  87/68/11   90 day recert for 60 day POC   Authorization Type  [employer]BS - 2021 primary, Medicare Secondary    PT Start Time  1530    PT Stop Time  1610    PT Time Calculation (min)  40 min    Equipment Utilized During Treatment  Gait belt    Activity Tolerance  Patient tolerated treatment well    Behavior During Therapy  WFL for tasks assessed/performed       Past Medical History:  Diagnosis Date  . Acute CVA (cerebrovascular accident) ([place]) 05/30/2019  . Acute kidney injury ([place])   . Acute respiratory failure ([place])   . Aspiration pneumonia ([place]) 06/14/2019  . 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 Diabetes ([place])   . 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 (CVA) 08/09/2019  . Pneumonia    aspiration pneumonia  . Sepsis with acute renal failure without septic shock ([place])   . Septic shock ([place])   . Stroke ([place])   . Urinary tract infection   . Urinary tract infection without hematuria     Past Surgical History:  Procedure Laterality Date  . CHOLECYSTECTOMY    . IR GASTROSTOMY TUBE MOD SED  07/04/2019    There were no vitals filed for this visit.  Subjective Assessment - 02/20/20 1531    Subjective  Pt reports that he is doing well.    Pertinent History  PMH: CVA (L lateral medullary syndrome) diabetes, HTN, OSA, morbid obesity.    Limitations   Standing;Walking;House hold activities    How long can you stand comfortably?  can stand approx. 10 minutes    How long can you walk comfortably?  20 minutes with the rollator    Patient Stated Goals  wants to walk better, walk more steady, wants to be able to get up more easily.    Currently in Pain?  No/denies    Pain Onset  More than a month ago    Pain Onset  More than a month ago                       [facility] Adult PT Treatment/Exercise - 02/20/20 1532      Transfers   Transfers  Sit to Stand;Stand to Sit    Sit to Stand  5: Supervision;4: Min assist    Sit to Stand Details  Verbal cues for technique    Sit to Stand Details (indicate cue type and reason)  Pt was able to rise from low mat without UE support supervision on second attempt. With rising from rollator still needs min assist to stabilize rollator as wants to slide back.    Stand to Sit  5: Supervision      Ambulation/Gait   Ambulation/Gait  Yes    Ambulation/Gait Assistance  6: Modified independent (Device/Increase time)  Ambulation/Gait Assistance Details  Pt was cued to work on increased step length and for upright posture.    Ambulation Distance (Feet)  300 Feet    Assistive device  Rollator    Gait Pattern  Step-through pattern    Ambulation Surface  Level;Indoor      Neuro Re-ed    Neuro Re-ed Details   In // bars: standing on rockerboard positioned ant/post trying to maintain level 30 sec x 2 CGA with leaning anteiror at times, then maintaining level with alternating shoulder flexion x 10. Board positioned lateral maintaining level x 30 sec then with adding in reaching across body for cone 3 x 2 each side CGA/min assist. Alternating toe taps on cone with 1 UE x 10 each arm holding. Pt less steady with left SLS. Pt took seated rest break then needed PT to stabilize rollator to rise safely. Standing on airex with feet apart without UE support 30 sec x 2 eyes open then 30 sec x 2 eyes closed. Pt had  increased sway with eyes closed with CGA/min assist for safety. Gait in // bars without UE suppot 8' x 6 then marching gait with 1 UE with some bouts of no support 8' x 6. CGA for safety. Pt was given verbal cues to tighten core to help with stability.      Knee/Hip Exercises: Aerobic   Other Aerobic  Sci-Fit level 3.5 x  6 min. Pt reported 5/10 RPE after and needed to rest. BP= 132/72             PT Education - 02/20/20 1720    Education Details  Pt to continue with current HEP    Person(s) Educated  Patient    Methods  Explanation    Comprehension  Verbalized understanding       PT Short Term Goals - 02/14/20 1327      PT SHORT TERM GOAL #1   Title  Patient will be independent with progression of HEP in order to build upon functional gains made in therapy.  ALL STGS DUE 02/16/2020 (4 weeks-may be delayed due to scheduling)    Baseline  Pt reports that exercises are going pretty good. Doing most days. Has been walking more outside.    Time  4    Period  Weeks    Status  Achieved      PT SHORT TERM GOAL #2   Title  Patient to improve BERG balance test to at least 42/56 for decreased fall risk.    Baseline  35/56 on 12/13/19, 38/56 on Berg on 01/09/20; 38/56 01/11/2020, 02/14/20 44/56    Time  4    Period  Weeks    Status  Achieved      PT SHORT TERM GOAL #3   Title  Patient will decrease TUG time with bariatric rollator to 15 seconds of less to demo decr fall risk.    Baseline  30.22 seconds on 10/31/19; 25.93 sec 12/11/2019; 18.97 sec with rollator 01/11/2020, 18 seconds with rollator on 02/12/20    Time  4    Period  Weeks    Status  Not Met      PT SHORT TERM GOAL #4   Title  Patient will improve gait velocity with bariatric rollator to at least 2.62 ft/sec to demo a limited community ambulator and decr fall risk.    Baseline  1.1 ft/sec with L AFO and bariatric rollator; 1.6 ft/sec 12/11/2019; 2.54 f/tsec 01/11/2020,  2.35 ft/sec on 02/12/20  Time  4    Period  Weeks     Status  Not Met      PT SHORT TERM GOAL #5   Title  Patient will decrease 5x sit <> stand time to 23 seconds or less from  mat table with UE support in order to demo improved functional LE strength.    Baseline  31.06 seconds; 27.19 sec 12/11/2019; 26.32 sec 01/11/2020 (from 20" mat surface), 20.47 seconds on 02/12/20 (from 20" mat surface)    Time  4    Period  Weeks    Status  Achieved        PT Long Term Goals - 02/12/20 1631      PT LONG TERM GOAL #1   Title  Patient will be independent with final HEP in order to build upon functional gains made in therapy. ALL LTGS DUE 03/15/2020 (8 weeks-may be delayed due to scheduling)    Time  8    Period  Weeks    Status  Revised      PT LONG TERM GOAL #2   Title  Berg score to imporve to at least 45/56 for decreased fall risk.    Time  8    Period  Weeks    Status  Revised      PT LONG TERM GOAL #3   Title  Patient will decrease 5x sit <> stand time to 18 seconds of less from elevated mat table (20" height) vs. standard chair with UE support in order to demo improved functional LE strength.    Baseline  20.47 seconds on 02/12/20 (from 20" mat surface    Time  8    Period  Weeks    Status  Revised      PT LONG TERM GOAL #4   Title  Patient will improve gait velocity with least restrictive assistive device to at least 2.6 ft/sec to demo improved community mobility.    Time  8    Period  Weeks    Status  Revised      PT LONG TERM GOAL #5   Title  Patient will decrease TUG time with least restrictive assistive device to 17 seconds of less to demo decr fall risk.    Baseline  18 seconds with rollator on 02/12/20    Time  8    Period  Weeks    Status  Revised      PT LONG TERM GOAL #6   Title  Patient will ambulate at least 300' with supervision over level indoor and outdoor surfaces with LRAD  with supervision in order to improve community mobility.    Time  8    Period  Weeks    Status  On-going            Plan - 02/20/20  1722    Clinical Impression Statement  Pt continues to show progress. He met Merrilee Jansky goal last visit increasing to 44/56 showing improving balance but still fall risk and needs a walker for safety. He has met 5 x sit to stand goal and continues to show improved time on TUG and gait speed. Pt as able to tolerate more balance activities on compliant surfaces with less UE support periods today. He continues to benefit from skilled PT to further improve his balance, strengthe and functional mobility.    Personal Factors and Comorbidities  Comorbidity 3+;Past/Current Experience;Time since onset of injury/illness/exacerbation    Comorbidities  diabetes, HTN, OSA, morbid obesity.  Examination-Activity Limitations  Squat;Transfers;Locomotion Level;Stairs    Examination-Participation Restrictions  Community Activity    Stability/Clinical Decision Making  Evolving/Moderate complexity    Rehab Potential  Good    PT Frequency  2x / week    PT Duration  8 weeks   per recert 03/30/2448   PT Treatment/Interventions  ADLs/Self Care Home Management;Balance training;Therapeutic exercise;Therapeutic activities;Functional mobility training;Stair training;Gait training;DME Instruction;Aquatic Therapy;Neuromuscular re-education;Patient/family education;Orthotic Fit/Training;Energy conservation    PT Next Visit Plan  . continue strength, balance strategies, compliant surfaces, use of aerobic machines and parallel bars for strength/balance    PT Home Exercise Plan  [id number] ([employer])    Consulted and Agree with Plan of Care  Patient       Patient will benefit from skilled therapeutic intervention in order to improve the following deficits and impairments:  Abnormal gait, Decreased activity tolerance, Decreased balance, Decreased coordination, Decreased range of motion, Decreased knowledge of use of DME, Decreased knowledge of precautions, Decreased endurance, Decreased strength, Difficulty walking, Postural dysfunction,  Impaired sensation, Obesity, Pain  Visit Diagnosis: Other abnormalities of gait and mobility  Muscle weakness (generalized)     Problem List Patient Active Problem List   Diagnosis Date Noted  . Lower extremity edema 11/24/2019  . Difficulty urinating 11/24/2019  . Dupuytren's contracture of left hand 09/20/2019  . Hyperlipidemia 08/24/2019  . Upper and lower extremity pain 08/24/2019  . OSA on CPAP 08/09/2019  . Copious oral secretions 08/09/2019  . Anemia of chronic disease   . Dysphagia due to recent cerebral infarction   . Diabetes mellitus type 2 in obese ([place])   . Morbid obesity ([place])   . Status post insertion of percutaneous endoscopic gastrostomy (PEG) tube ([place])   . History of CVA (cerebrovascular accident) 06/27/2019  . Essential hypertension 06/14/2019  . Renal insufficiency 05/30/2019  . Diplopia   . Chronic fatigue     Electa Sniff, PT, DPT, NCS 02/20/2020, 5:25 PM  [facility] [address] [place], [place], [address] Phone: [phone]   Fax:  [phone]  Name: Michael Bright MRN: [id number] Date of Birth: [birth year]

## 2020-02-21 ENCOUNTER — Ambulatory Visit (INDEPENDENT_AMBULATORY_CARE_PROVIDER_SITE_OTHER): Payer: BC Managed Care – PPO | Admitting: Primary Care

## 2020-02-21 ENCOUNTER — Encounter: Payer: Self-pay | Admitting: Primary Care

## 2020-02-21 VITALS — BP 124/74 | HR 72 | Temp 97.1°F | Ht 72.0 in | Wt 284.2 lb

## 2020-02-21 DIAGNOSIS — K5900 Constipation, unspecified: Secondary | ICD-10-CM

## 2020-02-21 DIAGNOSIS — G464 Cerebellar stroke syndrome: Secondary | ICD-10-CM

## 2020-02-21 DIAGNOSIS — E669 Obesity, unspecified: Secondary | ICD-10-CM | POA: Diagnosis not present

## 2020-02-21 DIAGNOSIS — R6889 Other general symptoms and signs: Secondary | ICD-10-CM

## 2020-02-21 DIAGNOSIS — Z8673 Personal history of transient ischemic attack (TIA), and cerebral infarction without residual deficits: Secondary | ICD-10-CM | POA: Diagnosis not present

## 2020-02-21 DIAGNOSIS — E1169 Type 2 diabetes mellitus with other specified complication: Secondary | ICD-10-CM

## 2020-02-21 LAB — POCT GLYCOSYLATED HEMOGLOBIN (HGB A1C): Hemoglobin A1C: 5.3 % (ref 4.0–5.6)

## 2020-02-21 MED ORDER — BASAGLAR KWIKPEN 100 UNIT/ML ~~LOC~~ SOPN: 8.0000 [IU] | PEN_INJECTOR | SUBCUTANEOUS | 1 refills | Status: DC

## 2020-02-21 MED ORDER — PANTOPRAZOLE SODIUM 40 MG PO PACK: 40.0000 mg | PACK | Freq: Every day | ORAL | 3 refills | Status: DC

## 2020-02-21 MED ORDER — SENNA 8.8 MG/5ML PO LIQD: ORAL | 3 refills | Status: DC

## 2020-02-21 MED ORDER — GLYCOPYRROLATE 1 MG PO TABS: 2.0000 mg | ORAL_TABLET | Freq: Three times a day (TID) | ORAL | 1 refills | Status: DC

## 2020-02-21 NOTE — Assessment & Plan Note (Signed)
Seems to be recovering well gradually.  Continue physical medicine follow up.  Continue to increase activity.

## 2020-02-21 NOTE — Progress Notes (Signed)
Subjective:    Patient ID: Michael Bright, male    DOB: 09-16-1956, 64 y.o.   MRN: [id number]  HPI  This visit occurred during the SARS-CoV-2 public health emergency.  Safety protocols were in place, including screening questions prior to the visit, additional usage of staff PPE, and extensive cleaning of exam room while observing appropriate contact time as indicated for disinfecting solutions.   Michael Bright is a 64 year old male with a history of type 2 diabetes, CVA with dysphagia, OSA, hypertension, chronic fatigue, obesity, hyperlipidemia who present today for follow up.  Overall doing better. Continues to follow with physical medicine. He is now able to walk 1/4 mile with his walker.   Currently managed on Basaglar 15 units HS for diabetes treatment. He is checking his blood glucose levels several times daily which is running in the 70's mostly. A few times his glucose readings have been in the 60's.   He is connected with a nutritionist for G-tube feedings, his wife orders his meals via phone.   He continues to struggle with constipation that has improved slightly with alterations in nutritional supplements. He is doing better on Senna daily and is needing a refill.  He continues to experience copious amounts of oral secretions, doesn't feel as though glycopyrrolate is effective. He denies esophageal burning/reflux but he does belch often. He is compliant to famotidine twice daily which hasn't helped with belching. His physical medicine doctor was supposed to order pantoprazole but the Rx never went through.   BP Readings from Last 3 Encounters:  02/21/20 124/74  02/12/20 123/71  01/31/20 130/75      Review of Systems  HENT:       Oral secretions  Respiratory: Negative for shortness of breath.   Cardiovascular: Negative for chest pain.  Gastrointestinal: Negative for nausea.       Belching   Neurological: Negative for dizziness and numbness.       Past Medical History:   Diagnosis Date  . Acute CVA (cerebrovascular accident) (HCC) 05/30/2019  . Acute kidney injury (HCC)   . Acute respiratory failure (HCC)   . Aspiration pneumonia (HCC) 06/14/2019  . 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 Diabetes (HCC)   . 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 (CVA) 08/09/2019  . Pneumonia    aspiration pneumonia  . Sepsis with acute renal failure without septic shock (HCC)   . Septic shock (HCC)   . Stroke (HCC)   . Urinary tract infection   . Urinary tract infection without hematuria      Social History   Socioeconomic History  . Marital status: Married    Spouse name: Not on file  . Number of children: Not on file  . Years of education: Not on file  . Highest education level: Not on file  Occupational History  . Not on file  Tobacco Use  . Smoking status: Never Smoker  . Smokeless tobacco: Never Used  Substance and Sexual Activity  . Alcohol use: Yes  . Drug use: No  . Sexual activity: Yes  Other Topics Concern  . Not on file  Social History Narrative  . Not on file   Social Determinants of Health   Financial Resource Strain:   . Difficulty of Paying Living Expenses:   Food Insecurity:   . Worried About Programme researcher, broadcasting/film/video in the Last Year:   . Barista in the Last Year:   Transportation Needs:   .  Lack of Transportation (Medical):   Marland Kitchen Lack of Transportation (Non-Medical):   Physical Activity:   . Days of Exercise per Week:   . Minutes of Exercise per Session:   Stress:   . Feeling of Stress :   Social Connections:   . Frequency of Communication with Friends and Family:   . Frequency of Social Gatherings with Friends and Family:   . Attends Religious Services:   . Active Member of Clubs or Organizations:   . Attends Archivist Meetings:   Marland Kitchen Marital Status:   Intimate Partner Violence:   . Fear of Current or Ex-Partner:   . Emotionally Abused:   Marland Kitchen Physically Abused:   . Sexually Abused:      Past Surgical History:  Procedure Laterality Date  . CHOLECYSTECTOMY    . IR GASTROSTOMY TUBE MOD SED  07/04/2019    No family history on file.  No Known Allergies  Current Outpatient Medications on File Prior to Visit  Medication Sig Dispense Refill  . Accu-Chek FastClix Lancets MISC USE AS DIRECTED UP TO FOUR TIMES DAILY 102 each 5  . ACCU-CHEK GUIDE test strip USE AS DIRECTED UP TO FOUR TIMES DAILY 100 strip 5  . acetaminophen (TYLENOL) 325 MG tablet Place 1-2 tablets (325-650 mg total) into feeding tube every 4 (four) hours as needed for mild pain.    . Amino Acids-Protein Hydrolys (FEEDING SUPPLEMENT, PRO-STAT SUGAR FREE 64,) LIQD Place 30 mLs into feeding tube 2 (two) times daily. 887 mL 6  . aspirin 81 MG chewable tablet Place 1 tablet (81 mg total) into feeding tube daily.    Marland Kitchen atorvastatin (LIPITOR) 40 MG tablet Take 1 tablet (40 mg total) by mouth at bedtime. For cholesterol. 90 tablet 3  . budesonide (PULMICORT) 0.25 MG/2ML nebulizer solution Take 2 mLs (0.25 mg total) by nebulization 2 (two) times daily. 60 mL 1  . chlorhexidine (PERIDEX) 0.12 % solution 15 mLs by Mouth Rinse route 2 (two) times daily. 1893 mL 3  . finasteride (PROSCAR) 5 MG tablet PLACE ONE TABLET IN GI TUBE ONCE DAILY FOR URINE FLOW. 90 tablet 1  . fluticasone (FLONASE) 50 MCG/ACT nasal spray Place 1 spray into both nostrils 2 (two) times daily as needed for allergies or rhinitis. 15.8 mL 2  . gabapentin (NEURONTIN) 250 MG/5ML solution Take 2.5-5 mLs (125-250 mg total) by mouth at bedtime. For pain. 150 mL 6  . guaifenesin (ROBITUSSIN) 100 MG/5ML syrup PLACE 10 MLS (200 MG TOTAL) INTO FEEDING TUBE 4 (FOUR) TIMES DAILY - WITH MEALS AND AT BEDTIME. 948 mL 0  . Insulin Pen Needle (BD PEN NEEDLE MICRO U/F) 32G X 6 MM MISC Use daily with insulin. 100 each 2  . lidocaine (LIDODERM) 5 % Apply at 8 pm and remove at 8 am daily. 30 patch 0  . mouth rinse LIQD solution 15 mLs by Mouth Rinse route 5 (five) times daily.  946 mL 1  . Nutritional Supplements (FEEDING SUPPLEMENT, OSMOLITE 1.5 CAL,) LIQD Place 325 mLs into feeding tube 4 (four) times daily.  0  . Water For Irrigation, Sterile (FREE WATER) SOLN Place 200 mLs into feeding tube every 8 (eight) hours. Use filtered or bottled water (not distilled water)    . famotidine (PEPCID) 40 MG/5ML suspension Take 20 mg by mouth daily.     No current facility-administered medications on file prior to visit.    BP 124/74   Pulse 72   Temp (!) 97.1 F (36.2 C) (Temporal)  Ht 6' (1.829 m)   Wt 284 lb 4 oz (128.9 kg)   SpO2 97%   BMI 38.55 kg/m    Objective:   Physical Exam  Constitutional: He is oriented to person, place, and time. He appears well-nourished.  Cardiovascular: Normal rate and regular rhythm.  Respiratory: Effort normal and breath sounds normal.  Musculoskeletal:     Comments: Ambulates well with walker  Neurological: He is alert and oriented to person, place, and time.  Skin: Skin is warm and dry.  Psychiatric: He has a normal mood and affect.           Assessment & Plan:

## 2020-02-21 NOTE — Assessment & Plan Note (Signed)
Overall does well with Senna, refill provided.

## 2020-02-21 NOTE — Assessment & Plan Note (Signed)
Refill provided for glycopyrrolate, re ordered pantoprazole.

## 2020-02-21 NOTE — Assessment & Plan Note (Signed)
Readings very well controlled with glucose dipping down into the 60's. A1C of 5.3. Decrease insulin to 8 units HS. He will monitor readings, consider discontinuing insulin.   Follow up in 6 months. He will update.

## 2020-02-21 NOTE — Patient Instructions (Signed)
Stop by the lab prior to leaving today. I will notify you of your results once received.   Please notify me if [facility] doesn't receive the refills.  Please schedule a follow up appointment in 6 months.   It was a pleasure to see you today!

## 2020-02-23 ENCOUNTER — Ambulatory Visit: Payer: BC Managed Care – PPO | Admitting: Primary Care

## 2020-02-27 ENCOUNTER — Ambulatory Visit (HOSPITAL_COMMUNITY)
Admission: RE | Admit: 2020-02-27 | Discharge: 2020-02-27 | Disposition: A | Discharge status: Home / Self Care | Payer: BC Managed Care – PPO | Source: Ambulatory Visit | Attending: Physical Medicine & Rehabilitation | Admitting: Physical Medicine & Rehabilitation

## 2020-02-27 ENCOUNTER — Other Ambulatory Visit: Payer: Self-pay | Admitting: Primary Care

## 2020-02-27 ENCOUNTER — Other Ambulatory Visit (HOSPITAL_COMMUNITY): Payer: Self-pay | Admitting: Physical Medicine & Rehabilitation

## 2020-02-27 ENCOUNTER — Ambulatory Visit: Payer: BC Managed Care – PPO | Admitting: Primary Care

## 2020-02-27 ENCOUNTER — Other Ambulatory Visit: Payer: Self-pay

## 2020-02-27 ENCOUNTER — Ambulatory Visit: Payer: BC Managed Care – PPO | Admitting: Physical Therapy

## 2020-02-27 DIAGNOSIS — R1310 Dysphagia, unspecified: Secondary | ICD-10-CM

## 2020-02-27 DIAGNOSIS — R278 Other lack of coordination: Secondary | ICD-10-CM

## 2020-02-27 DIAGNOSIS — R6889 Other general symptoms and signs: Secondary | ICD-10-CM

## 2020-02-27 DIAGNOSIS — I69391 Dysphagia following cerebral infarction: Secondary | ICD-10-CM

## 2020-02-27 DIAGNOSIS — R2689 Other abnormalities of gait and mobility: Secondary | ICD-10-CM | POA: Diagnosis not present

## 2020-02-27 DIAGNOSIS — R2681 Unsteadiness on feet: Secondary | ICD-10-CM

## 2020-02-27 DIAGNOSIS — M6281 Muscle weakness (generalized): Secondary | ICD-10-CM

## 2020-02-27 NOTE — Therapy (Signed)
[facility] [address] [place], [place], [address] Phone: [phone]   Fax:  [phone]  Physical Therapy Treatment  Patient Details  Name: Bonifacio Yassin MRN: [id number] Date of Birth: 02/10/1956 Referring Provider (PT): Zachary Swartz, MD   Encounter Date: 02/27/2020  PT End of Session - 02/27/20 2043    Visit Number  21    Number of Visits  31   per recert 01/11/2020   Date for PT Re-Evaluation  04/11/20   90 day recert for 60 day POC   Authorization Type  BCBS - 2021 primary, Medicare Secondary    PT Start Time  1531    PT Stop Time  1613    PT Time Calculation (min)  42 min    Equipment Utilized During Treatment  Gait belt    Activity Tolerance  Patient tolerated treatment well    Behavior During Therapy  WFL for tasks assessed/performed       Past Medical History:  Diagnosis Date  . Acute CVA (cerebrovascular accident) (HCC) 05/30/2019  . Acute kidney injury (HCC)   . Acute respiratory failure (HCC)   . Aspiration pneumonia (HCC) 06/14/2019  . 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 Diabetes (HCC)   . 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 (CVA) 08/09/2019  . Pneumonia    aspiration pneumonia  . Sepsis with acute renal failure without septic shock (HCC)   . Septic shock (HCC)   . Stroke (HCC)   . Urinary tract infection   . Urinary tract infection without hematuria     Past Surgical History:  Procedure Laterality Date  . CHOLECYSTECTOMY    . IR GASTROSTOMY TUBE MOD SED  07/04/2019    There were no vitals filed for this visit.  Subjective Assessment - 02/27/20 1533    Subjective  Had a swallowing test this morning. Walking more at home.    Pertinent History  PMH: CVA (L lateral medullary syndrome) diabetes, HTN, OSA, morbid obesity.    Limitations  Standing;Walking;House hold activities    How long can you stand comfortably?  can stand approx. 10 minutes    How long can  you walk comfortably?  20 minutes with the rollator    Patient Stated Goals  wants to walk better, walk more steady, wants to be able to get up more easily.    Currently in Pain?  No/denies    Pain Onset  More than a month ago    Pain Onset  More than a month ago                       [facility] Adult PT Treatment/Exercise - 02/27/20 0001      Transfers   Transfers  Sit to Stand;Stand to Sit    Sit to Stand  5: Supervision;4: Min guard;4: Min assist    Sit to Stand Details  Verbal cues for technique    Sit to Stand Details (indicate cue type and reason)  from mat table at 21.5" sit <> stands with no UE support x5 reps with cues for anterior weight shift    Stand to Sit  5: Supervision;4: Min guard    Comments  from elevated mat table at 25": sit <> stands on red beam for compliant surface, use of BUE support to stand and then grabbing onto rollator for balance, then letting go of rollator and maintaining balance before slowly lowering to mat with no UE support x5 reps        Neuro Re-ed    Neuro Re-ed Details   On foam AirEx in bars: with more narrow BOS and eyes open 2 x 10 reps head nods, 2 x 10 reps head turns. Wider BOS eyes closed 2 x 15 seconds, 2 x 20 second reps - min guard/min A for balance. Alternating forward stepping off foam x8 reps B with BUE then single UE support, with cues for increased foot clearance with LLE and to slow down at times. Alternating forward cone taps with toes x10 reps B with BUE then single UE support, progressing to x6 reps B forward cone tap and then cross body tap with BUE support in // bars. Forward gait in // bars with no UE support x2 reps and min guard, retro gait x2 reps with single UE support with cues for weight shift and incr step length with LLE       Knee/Hip Exercises: Aerobic   Other Aerobic  SciFit level 3.5 x7 min total with BLE and BUE for strengthening, endurance, and activity tolerance.  Took O2 and HR at 4:30 with O2 at 93% and HR  at 110 bpm, O2 able to incr with brief 30 second break and cues for pursed lip breathing.  Afterwards O2 at 93% HR at 112 bpm, pt able to recover with cues for pursed lip breathing with O2 back to 96%.                PT Short Term Goals - 02/14/20 1327      PT SHORT TERM GOAL #1   Title  Patient will be independent with progression of HEP in order to build upon functional gains made in therapy.  ALL STGS DUE 02/16/2020 (4 weeks-may be delayed due to scheduling)    Baseline  Pt reports that exercises are going pretty good. Doing most days. Has been walking more outside.    Time  4    Period  Weeks    Status  Achieved      PT SHORT TERM GOAL #2   Title  Patient to improve BERG balance test to at least 42/56 for decreased fall risk.    Baseline  35/56 on 12/13/19, 38/56 on Berg on 01/09/20; 38/56 01/11/2020, 02/14/20 44/56    Time  4    Period  Weeks    Status  Achieved      PT SHORT TERM GOAL #3   Title  Patient will decrease TUG time with bariatric rollator to 15 seconds of less to demo decr fall risk.    Baseline  30.22 seconds on 10/31/19; 25.93 sec 12/11/2019; 18.97 sec with rollator 01/11/2020, 18 seconds with rollator on 02/12/20    Time  4    Period  Weeks    Status  Not Met      PT SHORT TERM GOAL #4   Title  Patient will improve gait velocity with bariatric rollator to at least 2.62 ft/sec to demo a limited community ambulator and decr fall risk.    Baseline  1.1 ft/sec with L AFO and bariatric rollator; 1.6 ft/sec 12/11/2019; 2.54 f/tsec 01/11/2020,  2.35 ft/sec on 02/12/20    Time  4    Period  Weeks    Status  Not Met      PT SHORT TERM GOAL #5   Title  Patient will decrease 5x sit <> stand time to 23 seconds or less from  mat table with UE support in order to demo improved functional LE strength.  Baseline  31.06 seconds; 27.19 sec 12/11/2019; 26.32 sec 01/11/2020 (from 20" mat surface), 20.47 seconds on 02/12/20 (from 20" mat surface)    Time  4    Period  Weeks     Status  Achieved        PT Long Term Goals - 02/12/20 1631      PT LONG TERM GOAL #1   Title  Patient will be independent with final HEP in order to build upon functional gains made in therapy. ALL LTGS DUE 03/15/2020 (8 weeks-may be delayed due to scheduling)    Time  8    Period  Weeks    Status  Revised      PT LONG TERM GOAL #2   Title  Berg score to imporve to at least 45/56 for decreased fall risk.    Time  8    Period  Weeks    Status  Revised      PT LONG TERM GOAL #3   Title  Patient will decrease 5x sit <> stand time to 18 seconds of less from elevated mat table (20" height) vs. standard chair with UE support in order to demo improved functional LE strength.    Baseline  20.47 seconds on 02/12/20 (from 20" mat surface    Time  8    Period  Weeks    Status  Revised      PT LONG TERM GOAL #4   Title  Patient will improve gait velocity with least restrictive assistive device to at least 2.6 ft/sec to demo improved community mobility.    Time  8    Period  Weeks    Status  Revised      PT LONG TERM GOAL #5   Title  Patient will decrease TUG time with least restrictive assistive device to 17 seconds of less to demo decr fall risk.    Baseline  18 seconds with rollator on 02/12/20    Time  8    Period  Weeks    Status  Revised      PT LONG TERM GOAL #6   Title  Patient will ambulate at least 300' with supervision over level indoor and outdoor surfaces with LRAD  with supervision in order to improve community mobility.    Time  8    Period  Weeks    Status  On-going            Plan - 02/27/20 2047    Clinical Impression Statement  After performing SciFit for aerobic activity today for 4:30, pt with decr O2 sats at 93%, however pt able to incr stats back up to 96% after seated rest break and cues for pursed lip breathing. Pt needing min guard/min A for balance activities today, esp on foam surface with head motions/eyes closed. Will continue to progress towards  LTGS.    Personal Factors and Comorbidities  Comorbidity 3+;Past/Current Experience;Time since onset of injury/illness/exacerbation    Comorbidities  diabetes, HTN, OSA, morbid obesity.    Examination-Activity Limitations  Squat;Transfers;Locomotion Level;Stairs    Examination-Participation Restrictions  Community Activity    Stability/Clinical Decision Making  Evolving/Moderate complexity    Rehab Potential  Good    PT Frequency  2x / week    PT Duration  8 weeks   per recert 1/60/1093   PT Treatment/Interventions  ADLs/Self Care Home Management;Balance training;Therapeutic exercise;Therapeutic activities;Functional mobility training;Stair training;Gait training;DME Instruction;Aquatic Therapy;Neuromuscular re-education;Patient/family education;Orthotic Fit/Training;Energy conservation    PT Next Visit Plan  .  continue strength, balance strategies, compliant surfaces, use of aerobic machines and parallel bars for strength/balance    PT Home Exercise Plan  [id number] ([employer])    Consulted and Agree with Plan of Care  Patient       Patient will benefit from skilled therapeutic intervention in order to improve the following deficits and impairments:  Abnormal gait, Decreased activity tolerance, Decreased balance, Decreased coordination, Decreased range of motion, Decreased knowledge of use of DME, Decreased knowledge of precautions, Decreased endurance, Decreased strength, Difficulty walking, Postural dysfunction, Impaired sensation, Obesity, Pain  Visit Diagnosis: Other abnormalities of gait and mobility  Muscle weakness (generalized)  Unsteadiness on feet  Other lack of coordination     Problem List Patient Active Problem List   Diagnosis Date Noted  . Constipation 02/21/2020  . Lower extremity edema 11/24/2019  . Difficulty urinating 11/24/2019  . Dupuytren's contracture of left hand 09/20/2019  . Hyperlipidemia 08/24/2019  . Upper and lower extremity pain 08/24/2019  . OSA  on CPAP 08/09/2019  . Copious oral secretions 08/09/2019  . Anemia of chronic disease   . Dysphagia due to recent cerebral infarction   . Diabetes mellitus type 2 in obese (HCC)   . Morbid obesity (HCC)   . Status post insertion of percutaneous endoscopic gastrostomy (PEG) tube (HCC)   . History of CVA (cerebrovascular accident) 06/27/2019  . Essential hypertension 06/14/2019  . Renal insufficiency 05/30/2019  . Diplopia   . Chronic fatigue      N , PT, DPT  02/27/2020, 8:48 PM  [facility] [address] [place], [place], [address] Phone: [phone]   Fax:  [phone]  Name: Subhan Alfonzo MRN: [id number] Date of Birth: 04/21/1956   

## 2020-02-29 ENCOUNTER — Other Ambulatory Visit: Payer: Self-pay

## 2020-02-29 ENCOUNTER — Ambulatory Visit: Payer: BC Managed Care – PPO

## 2020-02-29 DIAGNOSIS — M6281 Muscle weakness (generalized): Secondary | ICD-10-CM

## 2020-02-29 DIAGNOSIS — R2689 Other abnormalities of gait and mobility: Secondary | ICD-10-CM

## 2020-02-29 DIAGNOSIS — R1313 Dysphagia, pharyngeal phase: Secondary | ICD-10-CM

## 2020-02-29 NOTE — Therapy (Signed)
[facility] [address] [place], [place], [address] Phone: [phone]   Fax:  [phone]  Physical Therapy Treatment  Patient Details  Name: Michael Bright MRN: [id number] Date of Birth: 12-May-1956 Referring Provider (PT): Alger Simons, MD   Encounter Date: 02/29/2020  PT End of Session - 02/29/20 1452    Visit Number  22    Number of Visits  31   per recert 01/15/5187   Date for PT Re-Evaluation  41/66/06   90 day recert for 60 day POC   Authorization Type  BCBS - 2021 primary, Medicare Secondary    PT Start Time  1449    PT Stop Time  1527    PT Time Calculation (min)  38 min    Equipment Utilized During Treatment  Gait belt    Activity Tolerance  Patient tolerated treatment well    Behavior During Therapy  [facility] for tasks assessed/performed       Past Medical History:  Diagnosis Date  . Acute CVA (cerebrovascular accident) ([place]) 05/30/2019  . Acute kidney injury ([place])   . Acute respiratory failure ([place])   . Aspiration pneumonia ([place]) 06/14/2019  . 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 Diabetes ([place])   . 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 (CVA) 08/09/2019  . Pneumonia    aspiration pneumonia  . Sepsis with acute renal failure without septic shock ([place])   . Septic shock ([place])   . Stroke ([place])   . Urinary tract infection   . Urinary tract infection without hematuria     Past Surgical History:  Procedure Laterality Date  . CHOLECYSTECTOMY    . IR GASTROSTOMY TUBE MOD SED  07/04/2019    There were no vitals filed for this visit.  Subjective Assessment - 02/29/20 1452    Subjective  Pt reports that he is having some issues with reflux. Waiting on the protonix still as there was issue with insurance and pharmacy has reached back out to MD. Pt has moist cough that is nonproductive that he reports is from the reflux.    Pertinent History  PMH: CVA (L lateral medullary syndrome)  diabetes, HTN, OSA, morbid obesity.    Limitations  Standing;Walking;House hold activities    How long can you stand comfortably?  can stand approx. 10 minutes    How long can you walk comfortably?  20 minutes with the rollator    Patient Stated Goals  wants to walk better, walk more steady, wants to be able to get up more easily.    Currently in Pain?  No/denies    Pain Onset  More than a month ago    Pain Onset  More than a month ago                        [facility] Adult PT Treatment/Exercise - 02/29/20 1453      Transfers   Transfers  Sit to Stand;Stand to Sit    Sit to Stand  5: Supervision;4: Min guard    Sit to Stand Details (indicate cue type and reason)  Pt performed sit to stand from rollator. PT had to stabilize lightly at times to prevent walker from sliding. Instructed to stand straight up and not push back.    Stand to Sit  5: Supervision      Ambulation/Gait   Ambulation/Gait  Yes    Ambulation/Gait Assistance  6: Modified independent (Device/Increase time);5: Supervision  Ambulation/Gait Assistance Details  Performed gait with 180 degree turns x 4 on command.    Ambulation Distance (Feet)  100 Feet    Assistive device  Rollator    Gait Pattern  Step-through pattern;Decreased step length - right;Decreased step length - left    Ambulation Surface  Level;Indoor      Neuro Re-ed    Neuro Re-ed Details   In // bars: pt performed marching gait 8' x 2 with bilateral UE support then 8' x 2 with 1 UE support alternating which arm each bout. Pt had more difficulty with L SLS when holding with left UE. Staggered stance 30 sec x 2 each position. Alternating toe taps on cone x 10 with bilateral UE support then x 10 each with 1 UE support switching arm half way through. Again more difficult with LUE support during left SLS time taking quick step with right. Standing on rockerboard postioned ant/post maintaining level x 30 sec then rocking back and forth x 20 with  occasional light UE support. Repeated with board turned lateral. CGA at times for safety with activities.      Knee/Hip Exercises: Aerobic   Other Aerobic  SciFit level 4 x  10 min. Prior to performance HR=98 and O2 sat=95%.. PT checked patient at 7 min and HR=116, O2 sat=93% and RPE=4.  At end of 10 minutes HR=120, O2=93%. Increased to 97% quickly with a couple deep breaths.              PT Education - 02/29/20 1849    Education Details  Pt to continue with current HEP    Person(s) Educated  Patient    Methods  Explanation    Comprehension  Verbalized understanding       PT Short Term Goals - 02/14/20 1327      PT SHORT TERM GOAL #1   Title  Patient will be independent with progression of HEP in order to build upon functional gains made in therapy.  ALL STGS DUE 02/16/2020 (4 weeks-may be delayed due to scheduling)    Baseline  Pt reports that exercises are going pretty good. Doing most days. Has been walking more outside.    Time  4    Period  Weeks    Status  Achieved      PT SHORT TERM GOAL #2   Title  Patient to improve BERG balance test to at least 42/56 for decreased fall risk.    Baseline  35/56 on 12/13/19, 38/56 on Berg on 01/09/20; 38/56 01/11/2020, 02/14/20 44/56    Time  4    Period  Weeks    Status  Achieved      PT SHORT TERM GOAL #3   Title  Patient will decrease TUG time with bariatric rollator to 15 seconds of less to demo decr fall risk.    Baseline  30.22 seconds on 10/31/19; 25.93 sec 12/11/2019; 18.97 sec with rollator 01/11/2020, 18 seconds with rollator on 02/12/20    Time  4    Period  Weeks    Status  Not Met      PT SHORT TERM GOAL #4   Title  Patient will improve gait velocity with bariatric rollator to at least 2.62 ft/sec to demo a limited community ambulator and decr fall risk.    Baseline  1.1 ft/sec with L AFO and bariatric rollator; 1.6 ft/sec 12/11/2019; 2.54 f/tsec 01/11/2020,  2.35 ft/sec on 02/12/20    Time  4    Period  Weeks    Status  Not  Met      PT SHORT TERM GOAL #5   Title  Patient will decrease 5x sit <> stand time to 23 seconds or less from  mat table with UE support in order to demo improved functional LE strength.    Baseline  31.06 seconds; 27.19 sec 12/11/2019; 26.32 sec 01/11/2020 (from 20" mat surface), 20.47 seconds on 02/12/20 (from 20" mat surface)    Time  4    Period  Weeks    Status  Achieved        PT Long Term Goals - 02/12/20 1631      PT LONG TERM GOAL #1   Title  Patient will be independent with final HEP in order to build upon functional gains made in therapy. ALL LTGS DUE 03/15/2020 (8 weeks-may be delayed due to scheduling)    Time  8    Period  Weeks    Status  Revised      PT LONG TERM GOAL #2   Title  Berg score to imporve to at least 45/56 for decreased fall risk.    Time  8    Period  Weeks    Status  Revised      PT LONG TERM GOAL #3   Title  Patient will decrease 5x sit <> stand time to 18 seconds of less from elevated mat table (20" height) vs. standard chair with UE support in order to demo improved functional LE strength.    Baseline  20.47 seconds on 02/12/20 (from 20" mat surface    Time  8    Period  Weeks    Status  Revised      PT LONG TERM GOAL #4   Title  Patient will improve gait velocity with least restrictive assistive device to at least 2.6 ft/sec to demo improved community mobility.    Time  8    Period  Weeks    Status  Revised      PT LONG TERM GOAL #5   Title  Patient will decrease TUG time with least restrictive assistive device to 17 seconds of less to demo decr fall risk.    Baseline  18 seconds with rollator on 02/12/20    Time  8    Period  Weeks    Status  Revised      PT LONG TERM GOAL #6   Title  Patient will ambulate at least 300' with supervision over level indoor and outdoor surfaces with LRAD  with supervision in order to improve community mobility.    Time  8    Period  Weeks    Status  On-going            Plan - 02/29/20 1850     Clinical Impression Statement  Pt showing less shaking in left leg with balance activities today. Is challenged in SLS on left with only support through left arm. Pt able to increase time on SciFit today minimal decrease in O2 sat with quick increase with pursed lip breathing.    Personal Factors and Comorbidities  Comorbidity 3+;Past/Current Experience;Time since onset of injury/illness/exacerbation    Comorbidities  diabetes, HTN, OSA, morbid obesity.    Examination-Activity Limitations  Squat;Transfers;Locomotion Level;Stairs    Examination-Participation Restrictions  Community Activity    Stability/Clinical Decision Making  Evolving/Moderate complexity    Rehab Potential  Good    PT Frequency  2x / week    PT Duration  8 weeks   per recert 7/53/0104   PT Treatment/Interventions  ADLs/Self Care Home Management;Balance training;Therapeutic exercise;Therapeutic activities;Functional mobility training;Stair training;Gait training;DME Instruction;Aquatic Therapy;Neuromuscular re-education;Patient/family education;Orthotic Fit/Training;Energy conservation    PT Next Visit Plan  . continue strength, balance strategies, compliant surfaces, use of aerobic machines and parallel bars for strength/balance    PT Home Exercise Plan  [id number] ([employer])    Consulted and Agree with Plan of Care  Patient       Patient will benefit from skilled therapeutic intervention in order to improve the following deficits and impairments:  Abnormal gait, Decreased activity tolerance, Decreased balance, Decreased coordination, Decreased range of motion, Decreased knowledge of use of DME, Decreased knowledge of precautions, Decreased endurance, Decreased strength, Difficulty walking, Postural dysfunction, Impaired sensation, Obesity, Pain  Visit Diagnosis: Other abnormalities of gait and mobility  Muscle weakness (generalized)     Problem List Patient Active Problem List   Diagnosis Date Noted  . Constipation  02/21/2020  . Lower extremity edema 11/24/2019  . Difficulty urinating 11/24/2019  . Dupuytren's contracture of left hand 09/20/2019  . Hyperlipidemia 08/24/2019  . Upper and lower extremity pain 08/24/2019  . OSA on CPAP 08/09/2019  . Copious oral secretions 08/09/2019  . Anemia of chronic disease   . Dysphagia due to recent cerebral infarction   . Diabetes mellitus type 2 in obese ([place])   . Morbid obesity ([place])   . Status post insertion of percutaneous endoscopic gastrostomy (PEG) tube ([place])   . History of CVA (cerebrovascular accident) 06/27/2019  . Essential hypertension 06/14/2019  . Renal insufficiency 05/30/2019  . Diplopia   . Chronic fatigue     Electa Sniff, PT, DPT, NCS 02/29/2020, 6:53 PM  [facility] [address] [place], [place], [address] Phone: [phone]   Fax:  [phone]  Name: Michael Bright MRN: [id number] Date of Birth: 1955/11/24

## 2020-03-01 NOTE — Telephone Encounter (Signed)
-----   Message from Barron Alvine, CCC-SLP sent at 03/01/2020 11:49 AM EDT ----- I think it's worth it. Last resort! He really wants to have a PO diet.   Baldo Ash ----- Message ----- From: Ranelle Oyster, MD Sent: 03/01/2020  11:20 AM EDT To: Barron Alvine, CCC-SLP  Inda [place] MBSS doesn't look a whole lot better although there is SOME improvement. Do you still think we should refer to [facility]?  z ----- Message ----- From: Nona Dell Sent: 02/05/2020   3:46 PM EDT To: Ranelle Oyster, MD  Hi Dr. Riley Kill- Mr Pattillo is ready for his follow up MBSS. Please order if you think it's appropriate. If things still don't look safe for transition back to an oral diet again, I would recommend [facility] for him for further swallowing rehab.   Thank you! Baldo Ash

## 2020-03-01 NOTE — Progress Notes (Signed)
Looks like only mild improvements on MBS. Will speak with SLP about future plan

## 2020-03-01 NOTE — Telephone Encounter (Signed)
Dr. Riley Kill, Do you have any recommendations regarding Mr. Kunkle message below? We've tried oral famotidine and I believe this was ineffective.  Thanks! Mayra Reel, NP-C [facility]

## 2020-03-01 NOTE — Telephone Encounter (Signed)
April I made a referral for SLP at [facility] for Michael Bright. Could you make sure it gets faxed there? It's [facility].   Thanks!

## 2020-03-05 ENCOUNTER — Encounter: Payer: Self-pay | Admitting: Physical Therapy

## 2020-03-05 ENCOUNTER — Ambulatory Visit: Payer: BC Managed Care – PPO | Admitting: Physical Therapy

## 2020-03-05 ENCOUNTER — Other Ambulatory Visit: Payer: Self-pay

## 2020-03-05 DIAGNOSIS — M6281 Muscle weakness (generalized): Secondary | ICD-10-CM

## 2020-03-05 DIAGNOSIS — R2681 Unsteadiness on feet: Secondary | ICD-10-CM

## 2020-03-05 DIAGNOSIS — R2689 Other abnormalities of gait and mobility: Secondary | ICD-10-CM | POA: Diagnosis not present

## 2020-03-05 DIAGNOSIS — R278 Other lack of coordination: Secondary | ICD-10-CM

## 2020-03-05 NOTE — Therapy (Signed)
[facility] [address] [place], [place], [address] Phone: [phone]   Fax:  [phone]  Physical Therapy Treatment  Patient Details  Name: Michael Bright MRN: [id number] Date of Birth: 1956-02-07 Referring Provider (PT): Alger Simons, MD   Encounter Date: 03/05/2020  PT End of Session - 03/05/20 1617    Visit Number  23    Number of Visits  31   per recert 04/19/6377   Date for PT Re-Evaluation  58/85/02   90 day recert for 60 day POC   Authorization Type  BCBS - 2021 primary, Medicare Secondary    PT Start Time  1532    PT Stop Time  1613    PT Time Calculation (min)  41 min    Equipment Utilized During Treatment  Gait belt    Activity Tolerance  Patient tolerated treatment well    Behavior During Therapy  [facility] for tasks assessed/performed       Past Medical History:  Diagnosis Date  . Acute CVA (cerebrovascular accident) ([place]) 05/30/2019  . Acute kidney injury ([place])   . Acute respiratory failure ([place])   . Aspiration pneumonia ([place]) 06/14/2019  . 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 Diabetes ([place])   . 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 (CVA) 08/09/2019  . Pneumonia    aspiration pneumonia  . Sepsis with acute renal failure without septic shock ([place])   . Septic shock ([place])   . Stroke ([place])   . Urinary tract infection   . Urinary tract infection without hematuria     Past Surgical History:  Procedure Laterality Date  . CHOLECYSTECTOMY    . IR GASTROSTOMY TUBE MOD SED  07/04/2019    There were no vitals filed for this visit.  Subjective Assessment - 03/05/20 1534    Subjective  No changes, no falls. Going to [facility] next Tuesday for another swallow test.    Pertinent History  PMH: CVA (L lateral medullary syndrome) diabetes, HTN, OSA, morbid obesity.    Limitations  Standing;Walking;House hold activities    How long can you stand comfortably?  can stand approx. 10  minutes    How long can you walk comfortably?  20 minutes with the rollator    Patient Stated Goals  wants to walk better, walk more steady, wants to be able to get up more easily.    Currently in Pain?  No/denies    Pain Onset  More than a month ago    Pain Onset  More than a month ago                        [facility] Adult PT Treatment/Exercise - 03/05/20 0001      Transfers   Transfers  Sit to Stand;Stand to Sit    Sit to Stand  5: Supervision;4: Min guard    Sit to Stand Details  Verbal cues for technique    Sit to Stand Details (indicate cue type and reason)  from lower mat table, using BUE support x10 reps    Stand to Sit  5: Supervision    Stand to Sit Details (indicate cue type and reason)  Verbal cues for technique    Stand to Sit Details  standing with no UE support and then slowly lowering down to mat table with no UE support       Neuro Re-ed    Neuro Re-ed Details   Standing at countertop on blue  compliant mat: alternating lateral stepping strategy x5 reps B with BUE support and then x5 reps B with single UE support, cues for weight shift and foot clearance, lateral weight shifting x10 reps B with reaching towards counter with ipsilateral UE and raising heel off the ground no UE support. Alternating posterior stepping strategy x5 reps with BUE support and x5 reps with single UE support, cues for larger step length posteriorly with RLE. Min guard for balance      Knee/Hip Exercises: Aerobic   Other Aerobic  SciFit level 4.0 for  8 min for BUE and BLE strengthening and activity tolerance. pre HR at 97 bpm and O2 sats at 97% At 5 min and HR=120 bpm, O2 sat=94% and RPE=4.  At end of 8 minutes HR=118, O2=95%.           Balance Exercises - 03/05/20 1617      Balance Exercises: Standing   Other Standing Exercises  In // bars: standing on blue foam beam with B fingertip support 2 x 10 reps head turns with wide BOS - min guard/ min A when pt tried a couple reps with  no UE support. On blue compliant mat: with single UE support forward marching x3 reps, backwards marching x3 reps - with cues for weight shift and step length. With single UE support x7 reps B semi-circle toe taps to colorful floor bubbles, with cues for slowed and controlled, attempted performing alternating single toe tap x8 reps to forward floor bubble with no UE support, but pt unable to perform, needing single UE support.           PT Short Term Goals - 02/14/20 1327      PT SHORT TERM GOAL #1   Title  Patient will be independent with progression of HEP in order to build upon functional gains made in therapy.  ALL STGS DUE 02/16/2020 (4 weeks-may be delayed due to scheduling)    Baseline  Pt reports that exercises are going pretty good. Doing most days. Has been walking more outside.    Time  4    Period  Weeks    Status  Achieved      PT SHORT TERM GOAL #2   Title  Patient to improve BERG balance test to at least 42/56 for decreased fall risk.    Baseline  35/56 on 12/13/19, 38/56 on Berg on 01/09/20; 38/56 01/11/2020, 02/14/20 44/56    Time  4    Period  Weeks    Status  Achieved      PT SHORT TERM GOAL #3   Title  Patient will decrease TUG time with bariatric rollator to 15 seconds of less to demo decr fall risk.    Baseline  30.22 seconds on 10/31/19; 25.93 sec 12/11/2019; 18.97 sec with rollator 01/11/2020, 18 seconds with rollator on 02/12/20    Time  4    Period  Weeks    Status  Not Met      PT SHORT TERM GOAL #4   Title  Patient will improve gait velocity with bariatric rollator to at least 2.62 ft/sec to demo a limited community ambulator and decr fall risk.    Baseline  1.1 ft/sec with L AFO and bariatric rollator; 1.6 ft/sec 12/11/2019; 2.54 f/tsec 01/11/2020,  2.35 ft/sec on 02/12/20    Time  4    Period  Weeks    Status  Not Met      PT SHORT TERM GOAL #5   Title  Patient will decrease 5x sit <> stand time to 23 seconds or less from  mat table with UE support in order to  demo improved functional LE strength.    Baseline  31.06 seconds; 27.19 sec 12/11/2019; 26.32 sec 01/11/2020 (from 20" mat surface), 20.47 seconds on 02/12/20 (from 20" mat surface)    Time  4    Period  Weeks    Status  Achieved        PT Long Term Goals - 02/12/20 1631      PT LONG TERM GOAL #1   Title  Patient will be independent with final HEP in order to build upon functional gains made in therapy. ALL LTGS DUE 03/15/2020 (8 weeks-may be delayed due to scheduling)    Time  8    Period  Weeks    Status  Revised      PT LONG TERM GOAL #2   Title  Berg score to imporve to at least 45/56 for decreased fall risk.    Time  8    Period  Weeks    Status  Revised      PT LONG TERM GOAL #3   Title  Patient will decrease 5x sit <> stand time to 18 seconds of less from elevated mat table (20" height) vs. standard chair with UE support in order to demo improved functional LE strength.    Baseline  20.47 seconds on 02/12/20 (from 20" mat surface    Time  8    Period  Weeks    Status  Revised      PT LONG TERM GOAL #4   Title  Patient will improve gait velocity with least restrictive assistive device to at least 2.6 ft/sec to demo improved community mobility.    Time  8    Period  Weeks    Status  Revised      PT LONG TERM GOAL #5   Title  Patient will decrease TUG time with least restrictive assistive device to 17 seconds of less to demo decr fall risk.    Baseline  18 seconds with rollator on 02/12/20    Time  8    Period  Weeks    Status  Revised      PT LONG TERM GOAL #6   Title  Patient will ambulate at least 300' with supervision over level indoor and outdoor surfaces with LRAD  with supervision in order to improve community mobility.    Time  8    Period  Weeks    Status  On-going            Plan - 03/05/20 1618    Clinical Impression Statement  Today's skilled session focused on BLE strengthening and standing balance strategies on compliant surfaces. Pt able to  perform SciFit today for 8 minutes with O2 sats remaining 94% and above. Pt still remains challenged by SLS activities, especially on LLE. Needed single UE support for all standing balance activities on compliant surfaces today. Will continue to progress towards LTGs.    Personal Factors and Comorbidities  Comorbidity 3+;Past/Current Experience;Time since onset of injury/illness/exacerbation    Comorbidities  diabetes, HTN, OSA, morbid obesity.    Examination-Activity Limitations  Squat;Transfers;Locomotion Level;Stairs    Examination-Participation Restrictions  Community Activity    Stability/Clinical Decision Making  Evolving/Moderate complexity    Rehab Potential  Good    PT Frequency  2x / week    PT Duration  8 weeks   per recert  01/11/2020   PT Treatment/Interventions  ADLs/Self Care Home Management;Balance training;Therapeutic exercise;Therapeutic activities;Functional mobility training;Stair training;Gait training;DME Instruction;Aquatic Therapy;Neuromuscular re-education;Patient/family education;Orthotic Fit/Training;Energy conservation    PT Next Visit Plan  look at HEP - any additions for standing balance? continue strength, balance strategies, compliant surfaces, use of aerobic machines and parallel bars for strength/balance    PT Home Exercise Plan  [id number] ([place])    Consulted and Agree with Plan of Care  Patient       Patient will benefit from skilled therapeutic intervention in order to improve the following deficits and impairments:  Abnormal gait, Decreased activity tolerance, Decreased balance, Decreased coordination, Decreased range of motion, Decreased knowledge of use of DME, Decreased knowledge of precautions, Decreased endurance, Decreased strength, Difficulty walking, Postural dysfunction, Impaired sensation, Obesity, Pain  Visit Diagnosis: Muscle weakness (generalized)  Other abnormalities of gait and mobility  Unsteadiness on feet  Other lack of  coordination     Problem List Patient Active Problem List   Diagnosis Date Noted  . Constipation 02/21/2020  . Lower extremity edema 11/24/2019  . Difficulty urinating 11/24/2019  . Dupuytren's contracture of left hand 09/20/2019  . Hyperlipidemia 08/24/2019  . Upper and lower extremity pain 08/24/2019  . OSA on CPAP 08/09/2019  . Copious oral secretions 08/09/2019  . Anemia of chronic disease   . Dysphagia due to recent cerebral infarction   . Diabetes mellitus type 2 in obese ([place])   . Morbid obesity ([place])   . Status post insertion of percutaneous endoscopic gastrostomy (PEG) tube ([place])   . History of CVA (cerebrovascular accident) 06/27/2019  . Essential hypertension 06/14/2019  . Renal insufficiency 05/30/2019  . Diplopia   . Chronic fatigue     Arliss Journey, PT, DPT  03/05/2020, 4:20 PM  [facility] [address] [place], [place], [address] Phone: [phone]   Fax:  [phone]  Name: Michael Bright MRN: [id number] Date of Birth: [birth year]

## 2020-03-07 ENCOUNTER — Other Ambulatory Visit: Payer: Self-pay

## 2020-03-07 ENCOUNTER — Ambulatory Visit: Payer: BC Managed Care – PPO

## 2020-03-07 DIAGNOSIS — R2689 Other abnormalities of gait and mobility: Secondary | ICD-10-CM | POA: Diagnosis not present

## 2020-03-07 DIAGNOSIS — M6281 Muscle weakness (generalized): Secondary | ICD-10-CM

## 2020-03-07 NOTE — Therapy (Signed)
[facility] [address] [place], [place], [address] Phone: [phone]   Fax:  [phone]  Physical Therapy Treatment  Patient Details  Name: Michael Bright MRN: [id number] Date of Birth: 04-12-1956 Referring Provider (PT): Alger Simons, MD   Encounter Date: 03/07/2020  PT End of Session - 03/07/20 1619    Visit Number  24    Number of Visits  31   per recert 6/94/8546   Date for PT Re-Evaluation  27/03/50   90 day recert for 60 day POC   Authorization Type  BCBS - 2021 primary, Medicare Secondary    PT Start Time  1616    PT Stop Time  1659    PT Time Calculation (min)  43 min    Equipment Utilized During Treatment  --    Activity Tolerance  Patient tolerated treatment well    Behavior During Therapy  [facility] for tasks assessed/performed       Past Medical History:  Diagnosis Date  . Acute CVA (cerebrovascular accident) ([place]) 05/30/2019  . Acute kidney injury ([place])   . Acute respiratory failure ([place])   . Aspiration pneumonia ([place]) 06/14/2019  . 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 Diabetes ([place])   . 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 (CVA) 08/09/2019  . Pneumonia    aspiration pneumonia  . Sepsis with acute renal failure without septic shock ([place])   . Septic shock ([place])   . Stroke ([place])   . Urinary tract infection   . Urinary tract infection without hematuria     Past Surgical History:  Procedure Laterality Date  . CHOLECYSTECTOMY    . IR GASTROSTOMY TUBE MOD SED  07/04/2019    There were no vitals filed for this visit.  Subjective Assessment - 03/07/20 1619    Subjective  Pt reports that he is going to [facility] for swallow test next Tuesday. Continues to have the reflux problems.    Pertinent History  PMH: CVA (L lateral medullary syndrome) diabetes, HTN, OSA, morbid obesity.    Limitations  Standing;Walking;House hold activities    How long can you stand comfortably?   can stand approx. 10 minutes    How long can you walk comfortably?  20 minutes with the rollator    Patient Stated Goals  wants to walk better, walk more steady, wants to be able to get up more easily.    Currently in Pain?  Yes    Pain Score  0-No pain    Pain Location  --   stomach   Pain Descriptors / Indicators  Discomfort    Pain Onset  More than a month ago    Pain Onset  More than a month ago                        [facility] Adult PT Treatment/Exercise - 03/07/20 1620      Transfers   Transfers  Sit to Stand;Stand to Sit    Sit to Stand  5: Supervision    Sit to Stand Details  Verbal cues for technique    Sit to Stand Details (indicate cue type and reason)  from low mat. Pt was able to bring legs back without prompting. Did need some cues to lean forward more.    Comments  Pt performed sit to stand x 5 from mat with hands in lap      Ambulation/Gait   Ambulation/Gait  Yes  Ambulation/Gait Assistance  5: Supervision    Ambulation/Gait Assistance Details  Pt performed gait with head turns 75' outside up/down and left/right.    Ambulation Distance (Feet)  600 Feet    Assistive device  Rollator    Gait Pattern  Step-through pattern    Ambulation Surface  Level;Unlevel;Outdoor;Paved      Neuro Re-ed    Neuro Re-ed Details   At counter: posterior stepping  x 10 each leg holding to counter, marching gait with 1 UE support then backwards gait 6' x 4 each, side stepping 6' x 8. Reciprocal steps over 3 yard sticks with 1 UE support 6' x 6 CGA. Pt was cued to control his steps and take time with turns. Holding with 1 UE support stepping over and back yard stick x 10 each leg. Pt was cued to take larger step back with right to get more SLS time on left. Standing with right foot on stepping stone 30 sec x 2 with reaching the second bout then 30 sec with eyes closed in this position. Had some shaking in left leg with eyes closed.      Knee/Hip Exercises: Aerobic   Other  Aerobic  SciFit level 4.5 x 10 min. at 1 min HR=104, O2 sat=95%. After completing HR=96, RPE=8/10, O2 sat =96%             PT Education - 03/07/20 1907    Education Details  Pt to continue with current HEP    Person(s) Educated  Patient    Methods  Explanation    Comprehension  Verbalized understanding       PT Short Term Goals - 02/14/20 1327      PT SHORT TERM GOAL #1   Title  Patient will be independent with progression of HEP in order to build upon functional gains made in therapy.  ALL STGS DUE 02/16/2020 (4 weeks-may be delayed due to scheduling)    Baseline  Pt reports that exercises are going pretty good. Doing most days. Has been walking more outside.    Time  4    Period  Weeks    Status  Achieved      PT SHORT TERM GOAL #2   Title  Patient to improve BERG balance test to at least 42/56 for decreased fall risk.    Baseline  35/56 on 12/13/19, 38/56 on Berg on 01/09/20; 38/56 01/11/2020, 02/14/20 44/56    Time  4    Period  Weeks    Status  Achieved      PT SHORT TERM GOAL #3   Title  Patient will decrease TUG time with bariatric rollator to 15 seconds of less to demo decr fall risk.    Baseline  30.22 seconds on 10/31/19; 25.93 sec 12/11/2019; 18.97 sec with rollator 01/11/2020, 18 seconds with rollator on 02/12/20    Time  4    Period  Weeks    Status  Not Met      PT SHORT TERM GOAL #4   Title  Patient will improve gait velocity with bariatric rollator to at least 2.62 ft/sec to demo a limited community ambulator and decr fall risk.    Baseline  1.1 ft/sec with L AFO and bariatric rollator; 1.6 ft/sec 12/11/2019; 2.54 f/tsec 01/11/2020,  2.35 ft/sec on 02/12/20    Time  4    Period  Weeks    Status  Not Met      PT SHORT TERM GOAL #5   Title  Patient will decrease 5x sit <> stand time to 23 seconds or less from  mat table with UE support in order to demo improved functional LE strength.    Baseline  31.06 seconds; 27.19 sec 12/11/2019; 26.32 sec 01/11/2020 (from 20"  mat surface), 20.47 seconds on 02/12/20 (from 20" mat surface)    Time  4    Period  Weeks    Status  Achieved        PT Long Term Goals - 02/12/20 1631      PT LONG TERM GOAL #1   Title  Patient will be independent with final HEP in order to build upon functional gains made in therapy. ALL LTGS DUE 03/15/2020 (8 weeks-may be delayed due to scheduling)    Time  8    Period  Weeks    Status  Revised      PT LONG TERM GOAL #2   Title  Berg score to imporve to at least 45/56 for decreased fall risk.    Time  8    Period  Weeks    Status  Revised      PT LONG TERM GOAL #3   Title  Patient will decrease 5x sit <> stand time to 18 seconds of less from elevated mat table (20" height) vs. standard chair with UE support in order to demo improved functional LE strength.    Baseline  20.47 seconds on 02/12/20 (from 20" mat surface    Time  8    Period  Weeks    Status  Revised      PT LONG TERM GOAL #4   Title  Patient will improve gait velocity with least restrictive assistive device to at least 2.6 ft/sec to demo improved community mobility.    Time  8    Period  Weeks    Status  Revised      PT LONG TERM GOAL #5   Title  Patient will decrease TUG time with least restrictive assistive device to 17 seconds of less to demo decr fall risk.    Baseline  18 seconds with rollator on 02/12/20    Time  8    Period  Weeks    Status  Revised      PT LONG TERM GOAL #6   Title  Patient will ambulate at least 300' with supervision over level indoor and outdoor surfaces with LRAD  with supervision in order to improve community mobility.    Time  8    Period  Weeks    Status  On-going            Plan - 03/07/20 1705    Clinical Impression Statement  Pt was able to perform balance activities with less UE at counter today. Less shaking in left leg with increased left weight shift noted.    Personal Factors and Comorbidities  Comorbidity 3+;Past/Current Experience;Time since onset of  injury/illness/exacerbation    Comorbidities  diabetes, HTN, OSA, morbid obesity.    Examination-Activity Limitations  Squat;Transfers;Locomotion Level;Stairs    Examination-Participation Restrictions  Community Activity    Stability/Clinical Decision Making  Evolving/Moderate complexity    Rehab Potential  Good    PT Frequency  2x / week    PT Duration  8 weeks   per recert 6/71/2458   PT Treatment/Interventions  ADLs/Self Care Home Management;Balance training;Therapeutic exercise;Therapeutic activities;Functional mobility training;Stair training;Gait training;DME Instruction;Aquatic Therapy;Neuromuscular re-education;Patient/family education;Orthotic Fit/Training;Energy conservation    PT Next Visit Plan  Next week is week  8. Check goals for d/c versus recert? look at HEP - any additions for standing balance? continue strength, balance strategies, compliant surfaces, use of aerobic machines and parallel bars for strength/balance    PT Home Exercise Plan  [id number] ([place])    Consulted and Agree with Plan of Care  Patient       Patient will benefit from skilled therapeutic intervention in order to improve the following deficits and impairments:  Abnormal gait, Decreased activity tolerance, Decreased balance, Decreased coordination, Decreased range of motion, Decreased knowledge of use of DME, Decreased knowledge of precautions, Decreased endurance, Decreased strength, Difficulty walking, Postural dysfunction, Impaired sensation, Obesity, Pain  Visit Diagnosis: Other abnormalities of gait and mobility  Muscle weakness (generalized)     Problem List Patient Active Problem List   Diagnosis Date Noted  . Constipation 02/21/2020  . Lower extremity edema 11/24/2019  . Difficulty urinating 11/24/2019  . Dupuytren's contracture of left hand 09/20/2019  . Hyperlipidemia 08/24/2019  . Upper and lower extremity pain 08/24/2019  . OSA on CPAP 08/09/2019  . Copious oral secretions  08/09/2019  . Anemia of chronic disease   . Dysphagia due to recent cerebral infarction   . Diabetes mellitus type 2 in obese ([place])   . Morbid obesity ([place])   . Status post insertion of percutaneous endoscopic gastrostomy (PEG) tube ([place])   . History of CVA (cerebrovascular accident) 06/27/2019  . Essential hypertension 06/14/2019  . Renal insufficiency 05/30/2019  . Diplopia   . Chronic fatigue     Electa Sniff, PT, DPT, NCS 03/07/2020, 7:09 PM  [facility] [address] [place], [place], [address] Phone: [phone]   Fax:  [phone]  Name: Michael Bright MRN: [id number] Date of Birth: 1956-06-19

## 2020-03-12 ENCOUNTER — Ambulatory Visit: Payer: BC Managed Care – PPO

## 2020-03-12 ENCOUNTER — Telehealth: Payer: Self-pay

## 2020-03-12 NOTE — Telephone Encounter (Signed)
Spoke to SLP

## 2020-03-12 NOTE — Telephone Encounter (Signed)
SLP called to discuss plan of care for Michael Bright - she asked if she could speak to you personally Dr. Riley Kill [phone] is her phone

## 2020-03-14 ENCOUNTER — Ambulatory Visit: Payer: BC Managed Care – PPO

## 2020-03-14 ENCOUNTER — Other Ambulatory Visit: Payer: Self-pay

## 2020-03-14 DIAGNOSIS — R2689 Other abnormalities of gait and mobility: Secondary | ICD-10-CM | POA: Diagnosis not present

## 2020-03-14 DIAGNOSIS — M6281 Muscle weakness (generalized): Secondary | ICD-10-CM

## 2020-03-14 NOTE — Therapy (Signed)
[facility] [address] [place], [place], [address] Phone: [phone]   Fax:  [phone]  Physical Therapy Treatment/Discharge Summary  Patient Details  Name: Michael Bright MRN: [id number] Date of Birth: 1956/01/30 Referring Provider (PT): Alger Simons, MD  PHYSICAL THERAPY DISCHARGE SUMMARY  Visits from Start of Care: 25  Current functional level related to goals / functional outcomes: See clinical impression for more info. Pt is ambulating mod I with rollator. He is still fall risk and needs to continue to utilize his walker. Has been instructed in HEP to continue to work with on own. Met all goals except TUG goal.    Remaining deficits: Balance deficits   Education / Equipment: HEP, continued rollator use  Plan: Patient agrees to discharge.  Patient goals were not met. Patient is being discharged due to meeting the stated rehab goals.  ?????       Encounter Date: 03/14/2020  PT End of Session - 03/14/20 1620    Visit Number  25    Number of Visits  31   per recert 02/04/3789   Date for PT Re-Evaluation  24/09/73   90 day recert for 60 day POC   Authorization Type  BCBS - 2021 primary, Medicare Secondary    PT Start Time  1616    PT Stop Time  1650   finished early as d/c   PT Time Calculation (min)  34 min    Activity Tolerance  Patient tolerated treatment well    Behavior During Therapy  WFL for tasks assessed/performed       Past Medical History:  Diagnosis Date  . Acute CVA (cerebrovascular accident) ([place]) 05/30/2019  . Acute kidney injury ([place])   . Acute respiratory failure ([place])   . Aspiration pneumonia ([place]) 06/14/2019  . 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 Diabetes ([place])   . 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 (CVA) 08/09/2019  . Pneumonia    aspiration pneumonia  . Sepsis with acute renal failure without septic shock ([place])   . Septic shock ([place])   .  Stroke ([place])   . Urinary tract infection   . Urinary tract infection without hematuria     Past Surgical History:  Procedure Laterality Date  . CHOLECYSTECTOMY    . IR GASTROSTOMY TUBE MOD SED  07/04/2019    There were no vitals filed for this visit.  Subjective Assessment - 03/14/20 1620    Subjective  Pt has swallow test the other day. They said he is still having some weakness in muscles in throat and advised him to try some blended foods followed by water. They feel that the mucous in throat is more from overproduction.    Pertinent History  PMH: CVA (L lateral medullary syndrome) diabetes, HTN, OSA, morbid obesity.    Limitations  Standing;Walking;House hold activities    How long can you stand comfortably?  can stand approx. 10 minutes    How long can you walk comfortably?  20 minutes with the rollator    Patient Stated Goals  wants to walk better, walk more steady, wants to be able to get up more easily.    Pain Onset  More than a month ago    Pain Onset  More than a month ago                        [facility] Adult PT Treatment/Exercise - 03/14/20 1622  Transfers   Transfers  Sit to Stand;Stand to Sit    Sit to Stand  6: Modified independent (Device/Increase time);5: Supervision    Sit to Stand Details  Verbal cues for technique    Five time sit to stand comments   18 sec from mat with UE support      Ambulation/Gait   Ambulation/Gait  Yes    Ambulation/Gait Assistance  6: Modified independent (Device/Increase time)    Ambulation Distance (Feet)  345 Feet    Assistive device  Rollator    Gait Pattern  Step-through pattern    Ambulation Surface  Level;Indoor    Gait velocity  12.65 sec=0.36ms    Ramp  5: Supervision    Ramp Details (indicate cue type and reason)  with rollator      Standardized Balance Assessment   Standardized Balance Assessment  Berg Balance Test;Timed Up and Go Test      Berg Balance Test   Sit to Stand  Able to stand without  using hands and stabilize independently    Standing Unsupported  Able to stand safely 2 minutes    Sitting with Back Unsupported but Feet Supported on Floor or Stool  Able to sit safely and securely 2 minutes    Stand to Sit  Sits safely with minimal use of hands    Transfers  Able to transfer safely, definite need of hands    Standing Unsupported with Eyes Closed  Able to stand 10 seconds safely    Standing Ubsupported with Feet Together  Able to place feet together independently and stand 1 minute safely    From Standing, Reach Forward with Outstretched Arm  Can reach confidently >25 cm (10")    From Standing Position, Pick up Object from Floor  Able to pick up shoe, needs supervision    From Standing Position, Turn to Look Behind Over each Shoulder  Looks behind from both sides and weight shifts well    Turn 360 Degrees  Able to turn 360 degrees safely but slowly    Standing Unsupported, Alternately Place Feet on Step/Stool  Able to complete >2 steps/needs minimal assist    Standing Unsupported, One Foot in Front  Able to plae foot ahead of the other independently and hold 30 seconds    Standing on One Leg  Tries to lift leg/unable to hold 3 seconds but remains standing independently    Total Score  45      Timed Up and Go Test   TUG  Normal TUG    Normal TUG (seconds)  28             PT Education - 03/14/20 2134    Education Details  Discussed discharge today.    Person(s) Educated  Patient    Methods  Explanation    Comprehension  Verbalized understanding       PT Short Term Goals - 02/14/20 1327      PT SHORT TERM GOAL #1   Title  Patient will be independent with progression of HEP in order to build upon functional gains made in therapy.  ALL STGS DUE 02/16/2020 (4 weeks-may be delayed due to scheduling)    Baseline  Pt reports that exercises are going pretty good. Doing most days. Has been walking more outside.    Time  4    Period  Weeks    Status  Achieved      PT  SHORT TERM GOAL #2   Title  Patient  to improve BERG balance test to at least 42/56 for decreased fall risk.    Baseline  35/56 on 12/13/19, 38/56 on Berg on 01/09/20; 38/56 01/11/2020, 02/14/20 44/56    Time  4    Period  Weeks    Status  Achieved      PT SHORT TERM GOAL #3   Title  Patient will decrease TUG time with bariatric rollator to 15 seconds of less to demo decr fall risk.    Baseline  30.22 seconds on 10/31/19; 25.93 sec 12/11/2019; 18.97 sec with rollator 01/11/2020, 18 seconds with rollator on 02/12/20    Time  4    Period  Weeks    Status  Not Met      PT SHORT TERM GOAL #4   Title  Patient will improve gait velocity with bariatric rollator to at least 2.62 ft/sec to demo a limited community ambulator and decr fall risk.    Baseline  1.1 ft/sec with L AFO and bariatric rollator; 1.6 ft/sec 12/11/2019; 2.54 f/tsec 01/11/2020,  2.35 ft/sec on 02/12/20    Time  4    Period  Weeks    Status  Not Met      PT SHORT TERM GOAL #5   Title  Patient will decrease 5x sit <> stand time to 23 seconds or less from  mat table with UE support in order to demo improved functional LE strength.    Baseline  31.06 seconds; 27.19 sec 12/11/2019; 26.32 sec 01/11/2020 (from 20" mat surface), 20.47 seconds on 02/12/20 (from 20" mat surface)    Time  4    Period  Weeks    Status  Achieved        PT Long Term Goals - 03/14/20 1623      PT LONG TERM GOAL #1   Title  Patient will be independent with final HEP in order to build upon functional gains made in therapy. ALL LTGS DUE 03/15/2020 (8 weeks-may be delayed due to scheduling)    Baseline  Pt reports his exercises are going well.    Time  8    Period  Weeks    Status  Achieved      PT LONG TERM GOAL #2   Title  Berg score to imporve to at least 45/56 for decreased fall risk.    Baseline  45/56 on 03/14/20    Time  8    Period  Weeks    Status  Achieved      PT LONG TERM GOAL #3   Title  Patient will decrease 5x sit <> stand time to 18 seconds of  less from elevated mat table (20" height) vs. standard chair with UE support in order to demo improved functional LE strength.    Baseline  20.47 seconds on 02/12/20 (from 20" mat surface), 03/14/20 18 sec from mat with UE support    Time  8    Period  Weeks    Status  Achieved      PT LONG TERM GOAL #4   Title  Patient will improve gait velocity with least restrictive assistive device to at least 2.6 ft/sec to demo improved community mobility.    Baseline  2.34f/sec    Time  8    Period  Weeks    Status  Partially Met      PT LONG TERM GOAL #5   Title  Patient will decrease TUG time with least restrictive assistive device to 17 seconds of less  to demo decr fall risk.    Baseline  18 seconds with rollator on 02/12/20, 03/14/20 TUG=28 sec    Time  8    Period  Weeks    Status  Not Met      PT LONG TERM GOAL #6   Title  Patient will ambulate at least 300' with supervision over level indoor and outdoor surfaces with LRAD  with supervision in order to improve community mobility.    Time  8    Period  Weeks    Status  Achieved            Plan - 03/14/20 2135    Clinical Impression Statement  PT reassessed goals today. Pt met Berg and 5 x sit to stand goal showing improving balance. Pt increased gait speed just shy of goal. He is ambulating at speed just short of community ambulator speed. Pt still needs to use rollator at all times for safety due to balance deficits. Pt will continue with HEP on own.    Personal Factors and Comorbidities  Comorbidity 3+;Past/Current Experience;Time since onset of injury/illness/exacerbation    Comorbidities  diabetes, HTN, OSA, morbid obesity.    Examination-Activity Limitations  Squat;Transfers;Locomotion Level;Stairs    Examination-Participation Restrictions  Community Activity    Stability/Clinical Decision Making  Evolving/Moderate complexity    Rehab Potential  Good    PT Frequency  2x / week    PT Duration  8 weeks   per recert 6/00/4599    PT Treatment/Interventions  ADLs/Self Care Home Management;Balance training;Therapeutic exercise;Therapeutic activities;Functional mobility training;Stair training;Gait training;DME Instruction;Aquatic Therapy;Neuromuscular re-education;Patient/family education;Orthotic Fit/Training;Energy conservation    PT Next Visit Plan  Discharged today    PT Home Exercise Plan  [id number] ([place])    Consulted and Agree with Plan of Care  Patient       Patient will benefit from skilled therapeutic intervention in order to improve the following deficits and impairments:  Abnormal gait, Decreased activity tolerance, Decreased balance, Decreased coordination, Decreased range of motion, Decreased knowledge of use of DME, Decreased knowledge of precautions, Decreased endurance, Decreased strength, Difficulty walking, Postural dysfunction, Impaired sensation, Obesity, Pain  Visit Diagnosis: Other abnormalities of gait and mobility  Muscle weakness (generalized)     Problem List Patient Active Problem List   Diagnosis Date Noted  . Constipation 02/21/2020  . Lower extremity edema 11/24/2019  . Difficulty urinating 11/24/2019  . Dupuytren's contracture of left hand 09/20/2019  . Hyperlipidemia 08/24/2019  . Upper and lower extremity pain 08/24/2019  . OSA on CPAP 08/09/2019  . Copious oral secretions 08/09/2019  . Anemia of chronic disease   . Dysphagia due to recent cerebral infarction   . Diabetes mellitus type 2 in obese ([place])   . Morbid obesity ([place])   . Status post insertion of percutaneous endoscopic gastrostomy (PEG) tube ([place])   . History of CVA (cerebrovascular accident) 06/27/2019  . Essential hypertension 06/14/2019  . Renal insufficiency 05/30/2019  . Diplopia   . Chronic fatigue     Electa Sniff, PT, DPT, NCS 03/14/2020, 9:38 PM  [facility] [address] [place], [place], [address] Phone: [phone]   Fax:   [phone]  Name: Michael Bright MRN: [id number] Date of Birth: 1956-05-19

## 2020-03-15 ENCOUNTER — Other Ambulatory Visit: Payer: Self-pay | Admitting: Primary Care

## 2020-03-21 ENCOUNTER — Telehealth: Payer: Self-pay | Admitting: Primary Care

## 2020-03-21 NOTE — Telephone Encounter (Signed)
Correction to previous note: Ppw has not been faxed. It must be returned to the pt. But it has been placed at the front for pickup.

## 2020-03-21 NOTE — Telephone Encounter (Signed)
Called pt to let him know FMLA ppw has been completed and faxed. A copy has been placed up front for pickup.

## 2020-04-15 ENCOUNTER — Ambulatory Visit (INDEPENDENT_AMBULATORY_CARE_PROVIDER_SITE_OTHER): Payer: BC Managed Care – PPO | Admitting: Adult Health

## 2020-04-15 ENCOUNTER — Encounter: Payer: Self-pay | Admitting: Adult Health

## 2020-04-15 ENCOUNTER — Other Ambulatory Visit: Payer: Self-pay

## 2020-04-15 VITALS — BP 136/78 | HR 96 | Ht 72.0 in | Wt 274.0 lb

## 2020-04-15 DIAGNOSIS — G4733 Obstructive sleep apnea (adult) (pediatric): Secondary | ICD-10-CM | POA: Diagnosis not present

## 2020-04-15 DIAGNOSIS — E785 Hyperlipidemia, unspecified: Secondary | ICD-10-CM

## 2020-04-15 DIAGNOSIS — Z794 Long term (current) use of insulin: Secondary | ICD-10-CM

## 2020-04-15 DIAGNOSIS — I10 Essential (primary) hypertension: Secondary | ICD-10-CM

## 2020-04-15 DIAGNOSIS — G464 Cerebellar stroke syndrome: Secondary | ICD-10-CM | POA: Diagnosis not present

## 2020-04-15 DIAGNOSIS — E1149 Type 2 diabetes mellitus with other diabetic neurological complication: Secondary | ICD-10-CM

## 2020-04-15 NOTE — Patient Instructions (Signed)
Your Plan:  Referral placed to our sleep center to establish care with one of our sleep providers for sleep apnea management -you would be called to schedule initial evaluation  Please follow-up with your PCP for hypertension, hyperlipidemia and diabetes as well as obtaining pantoprazole for acid reflux symptoms  Continue aspirin 81 mg daily and atorvastatin 40 mg daily for secondary stroke prevention    Follow-up in 4 months or call earlier if needed     Thank you for coming to see [place] at [facility]. I hope we have been able to provide you high quality care today.  You may receive a patient satisfaction survey over the next few weeks. We would appreciate your feedback and comments so that we may continue to improve ourselves and the health of our patients.

## 2020-04-15 NOTE — Progress Notes (Signed)
[facility] [address] [place]. [place] [address] [phone]       STROKE FOLLOW UP NOTE  Mr. Maritza Goldsborough Date of Birth:  December 26, 1955 Medical Record Number:  [id number]   Reason for Referral: stroke follow up    CHIEF COMPLAINT:  Chief Complaint  Patient presents with  . Follow-up    Tx grm here for a stroke f/u. Pt and his wife said no new sx.    HPI:  Today, 04/15/2020, Mr. Castorena is being seen for stroke follow-up accompanied by his wife.  Residual deficits of RUE altered sensation and dysphagia.  Continues to work with outpatient therapy with ongoing improvement.  Continues to receive primary nutrition via G-tube but is able to eat soft food and drink regular liquids without difficulty.  He continues to experience acid reflux type symptoms and apparently was not able to initiate pantoprazole as he reports insurance declined coverage despite PCP recently providing refill.  Continues on aspirin 81 mg daily and atorvastatin 40 mg daily.  Blood pressure today 136/78.  He requests establishing care with new sleep provider as he is due for new CPAP machine and does not have current provider.  No further concerns at this time.    History provided for reference purposes only Update 01/01/2020 JM: Mr. Desroches is a 64 year old male who is being seen today, 01/01/2020, for stroke follow-up accompanied by his wife.  He has been stable from a stroke standpoint with residual deficits of right hemiparesis, RUE paresthesias and dysphagia s/p PEG tube. Continues to work with outpatient therapy with ongoing improvement.  Continues to use Rollator walker at all times and denies any recent falls.  Ongoing use of PEG tube but has been advanced to NTL without difficulty.  He does endorse increased acid reflux symptoms over the past month which is also required increased suctioning.  He does report use of tomato juice during therapy.  Continues cyclic tube feeding and recently evaluated  nutrition who changed tube feed formulation due to constipation and weight gain -has not yet started new feeding.  Continues on gabapentin 250 mg nightly for RUE pain but endorses ongoing constant warm sensation.  Completed 3 months DAPT and continues on aspirin alone without bleeding or bruising.  Continues on atorvastatin 40 mg daily without myalgias.  Recent A1c 5.7.  Continues to follow PCP for HTN, HLD and DM management.  Blood pressure today 136/76. No further concerns at this time  Initial visit 09/11/2019 JM: Mr. Pangborn is a 64 year old male who is being seen today for hospital follow-up accompanied by his wife.  He has been doing well since discharge with residual right-sided weakness, imbalance and dysphagia.  He does endorse ongoing improvement with home health therapies and plans on transitioning to outpatient therapies once completed.  He does endorse occasional sharp stabbing sensation with flexion of his left wrist and will resolve with extension.  Denies radiating pain in the wrist or fingers.  He will also experience intermittent shock type sensations on right upper and lower extremity.  He was started on gabapentin 250 mg at bedtime with little improvement.  He continues to use a rolling walker for ambulation as well as AFO brace and denies any recent falls.  Remains n.p.o. and receives all nutrition, hydration and medications through PEG tube.  He does endorse ongoing difficulty with excretions and does have suction device at home for assistance.  Improvement of neurogenic bladder and has been voiding independently.  PCP recently discontinued citalopram as he  did not have residual depression/anxiety symptoms.  Depression/anxiety has been stable at this time.  He has continued on aspirin and Plavix without bleeding or bruising.  Continues on atorvastatin without myalgias.  Recent lipid panel obtained by PCP with improvement of LDL 55.  Blood pressure today satisfactory at 125/95.  Glucose levels  greatly improved with recent A1c 7.3.  No further concerns at this time.  Stroke admission 05/30/2019: Mr. Slyvester Latona is a 64 y.o. 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  CTA head/neck negative LVO with diffuse intracranial stenosis moderate supraclinoid bilateral ICAs and mild stenosis left VA origin.  2D echo normal EF without cardiac source of embolus identified.  LDL 135.  A1c 10.6.  Recommended DAPT for 3 months then aspirin alone.  Blood pressure stable during admission without prior history of HTN.  Initiated atorvastatin 40 mg daily.  New diagnosis of DM and recommended close PCP follow-up.  Other stroke is factors include morbid obesity and EtOH but no prior history of stroke.  Hospital course complicated by hypoxemic respiratory failure secondary to medullary stroke intubated on 8/15 with trach placement on 8/21, possible aspiration pneumonia secondary to medullary stroke and dysphagia secondary to medullary stroke with placement of PEG tube on 9/15.  He was discharged to CIR on 06/27/2019 for ongoing therapy with residual deficits.  During admission to CIR, he had improvement of respiratory status and was decannulated on 9/22.  Difficulty with neurogenic bladder requiring intermittent caths and discharged with recommendation on following up with urology outpatient.  He was discharged home in stable condition with recommendation of home health therapy on 07/17/2019.  He unfortunately was readmitted on 07/20/2019 with septic shock due to Klebsiella pneumoniae bacteremia with UTI and hypoxic respiratory failure bilateral lung infiltrates due to rhinovirus.  Required intubation briefly and treated with broad-spectrum antibiotics eventually narrowed with recommendation of  2-week total antibiotic course.  Hospital course significant for AKI with abnormal LFTs due to shock liver, intermittent hypoxic episode as well as significant oral secretions with dysphonia.  He was discharged back to CIR for ongoing therapy due to debility.  Ongoing deficits of severe pharyngeal dysphagia and mild memory deficits affecting recall but showed improvement of activity tolerance, balance and postural control as well as ability to compensate for deficits and was discharged home with home health therapy on 08/08/2019.    ROS:   14 system review of systems performed and negative with exception of weakness, acid reflux, pain and dysphagia  PMH:  Past Medical History:  Diagnosis Date  . Acute CVA (cerebrovascular accident) ([place]) 05/30/2019  . Acute kidney injury ([place])   . Acute respiratory failure ([place])   . Aspiration pneumonia ([place]) 06/14/2019  . 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 Diabetes ([place])   . 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 (CVA) 08/09/2019  . Pneumonia    aspiration pneumonia  . Sepsis with acute renal failure without septic shock ([place])   . Septic shock ([place])   . Stroke ([place])   . Urinary tract infection   . Urinary tract infection without hematuria     PSH:  Past Surgical History:  Procedure Laterality Date  . CHOLECYSTECTOMY    . IR GASTROSTOMY TUBE MOD SED  07/04/2019    Social History:  Social History   Socioeconomic History  . Marital status: Married    Spouse name: Not on file  . Number of children: Not on file  . Years of education: Not on file  . Highest education level: Not on file  Occupational History  . Not on file  Tobacco Use  . Smoking status: Never Smoker  . Smokeless tobacco: Never Used  Vaping Use  . Vaping Use: Never used  Substance and Sexual Activity  . Alcohol use: Yes  . Drug use: No  . Sexual activity: Yes  Other Topics Concern  . Not on file  Social History Narrative  . Not on file    Social Determinants of Health   Financial Resource Strain:   . Difficulty of Paying Living Expenses:   Food Insecurity:   . Worried About Programme researcher, broadcasting/film/video in the Last Year:   . Barista in the Last Year:   Transportation Needs:   . Freight forwarder (Medical):   Marland Kitchen Lack of Transportation (Non-Medical):   Physical Activity:   . Days of Exercise per Week:   . Minutes of Exercise per Session:   Stress:   . Feeling of Stress :   Social Connections:   . Frequency of Communication with Friends and Family:   . Frequency of Social Gatherings with Friends and Family:   . Attends Religious Services:   . Active Member of Clubs or Organizations:   . Attends Banker Meetings:   Marland Kitchen Marital Status:   Intimate Partner Violence:   . Fear of Current or Ex-Partner:   . Emotionally Abused:   Marland Kitchen Physically Abused:   . Sexually Abused:     Family History: No family history on file.  Medications:   Current Outpatient Medications on File Prior to Visit  Medication Sig Dispense Refill  . Accu-Chek FastClix Lancets MISC USE AS DIRECTED UP TO FOUR TIMES DAILY 102 each 5  . ACCU-CHEK GUIDE test strip USE AS DIRECTED UP TO FOUR TIMES DAILY 100 strip 5  . acetaminophen (TYLENOL) 325 MG tablet Place 1-2 tablets (325-650 mg total) into feeding tube every 4 (four) hours as needed for mild pain.    Marland Kitchen aspirin 81 MG chewable tablet Place 1 tablet (81 mg total) into feeding tube daily.    Marland Kitchen atorvastatin (LIPITOR) 40 MG tablet Take 1 tablet (40 mg total) by mouth at bedtime. For cholesterol. 90 tablet 3  . budesonide (PULMICORT) 0.25 MG/2ML nebulizer solution TAKE 2 MLS (0.25 MG TOTAL) BY NEBULIZATION 2 (TWO) TIMES DAILY. 360 mL 1  . chlorhexidine (PERIDEX) 0.12 % solution 15 mLs by Mouth Rinse route 2 (two) times daily. 1893 mL 3  . finasteride (PROSCAR) 5 MG tablet PLACE ONE TABLET IN GI TUBE ONCE DAILY FOR URINE FLOW. 90 tablet 1  . fluticasone (FLONASE) 50 MCG/ACT nasal spray  PLACE 1 SPRAY INTO BOTH NOSTRILS 2 (TWO) TIMES DAILY AS NEEDED FOR ALLERGIES OR RHINITIS. 16 mL 2  . gabapentin (NEURONTIN) 250 MG/5ML solution Take 2.5-5 mLs (125-250 mg total) by mouth at bedtime. For pain. 150 mL 6  . glycopyrrolate (ROBINUL) 1 MG tablet Place 2 tablets (2 mg total) into feeding tube 3 (three) times daily. For oral secretions. 540 tablet 1  . guaifenesin (ROBITUSSIN) 100 MG/5ML syrup PLACE 10 MLS (200 MG TOTAL) INTO FEEDING TUBE 4 (FOUR) TIMES DAILY - WITH MEALS AND AT BEDTIME. 948 mL 0  . Insulin Glargine (BASAGLAR KWIKPEN) 100 UNIT/ML Inject 0.08 mLs (  8 Units total) into the skin every morning. 5 mL 1  . Insulin Pen Needle (BD PEN NEEDLE MICRO U/F) 32G X 6 MM MISC Use daily with insulin. 100 each 2  . mouth rinse LIQD solution 15 mLs by Mouth Rinse route 5 (five) times daily. 946 mL 1  . Nutritional Supplements (FEEDING SUPPLEMENT, OSMOLITE 1.5 CAL,) LIQD Place 325 mLs into feeding tube 4 (four) times daily.  0  . Sennosides (SENNA) 8.8 MG/5ML LIQD PLACE 5 MLS INTO FEEDING TUBE 2 (TWO) TIMES DAILY AS NEEDED FOR MILD CONSTIPATION. 237 mL 3  . Water For Irrigation, Sterile (FREE WATER) SOLN Place 200 mLs into feeding tube every 8 (eight) hours. Use filtered or bottled water (not distilled water)     No current facility-administered medications on file prior to visit.    Allergies:  No Known Allergies   Physical Exam  Vitals:   04/15/20 1426  BP: 136/78  Pulse: 96  Weight: 274 lb (124.3 kg)  Height: 6' (1.829 m)   Body mass index is 37.16 kg/m. No exam data present  General: Obese very pleasant middle-age African-American male, seated, in no evident distress Head: head normocephalic and atraumatic.   Neck: supple with no carotid or supraclavicular bruits Cardiovascular: regular rate and rhythm, no murmurs Musculoskeletal: no deformity Skin:  no rash/petichiae; PEG tube intact Vascular:  Normal pulses all extremities   Neurologic Exam Mental Status: Awake and  fully alert.   Mild dysarthria.  Oriented to place and time. Recent and remote memory intact. Attention span, concentration and fund of knowledge appropriate. Mood and affect appropriate.  Cranial Nerves: Pupils equal, briskly reactive to light. Extraocular movements full without nystagmus. Visual fields full to confrontation. Hearing intact. Facial sensation intact.  Slight right lower facial weakness Motor: Normal bulk and tone.  Full strength in all tested extremities. Sensory.: intact to touch , pinprick , position and vibratory sensation.  Coordination: Rapid alternating movements normal in all extremities. Finger-to-nose and heel-to-shin performed accurately bilaterally. Gait and Station: Arises from chair with mild difficulty. Stance is normal. Gait demonstrates normal stride length and balance with use of Rollator walker Reflexes: 1+ and symmetric. Toes downgoing.       ASSESSMENT/PLAN: Anay Rathe is a 63 y.o. 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 Vascular risk factors include HTN, HLD, DM, EtOH use and morbid obesity.  Prolonged hospital course due to complications including hypoxic respiratory failure with placement of tracheostomy, PEG tube due to dysphagia post stroke, neurogenic bladder post stroke and aspiration pneumonia.  He returned shortly after CIR discharge due to septic shock requiring additional inpatient rehab stay.  Trach tube removed and improvement of neurogenic bladder.     1. Left lateral medullary syndrome:  -Residual deficits: RUE sensory impairment and dysphagia with use of PEG tube.  Advised to continue to work with outpatient therapy with ongoing improvement -Continue aspirin 81 mg daily  and atorvastatin for secondary stroke prevention.  Maintain strict control of hypertension with blood  pressure goal below 130/90, diabetes with hemoglobin A1c goal below 6.5% and cholesterol with LDL cholesterol (bad cholesterol) goal below 70 mg/dL.  I also advised the patient to eat a healthy diet with plenty of whole grains, cereals, fruits and vegetables, exercise regularly with at least 30 minutes of continuous activity daily and maintain ideal body weight. 2. HTN:  -BP goal<130/90 -Stable  today.  Monitoring and management defer to PCP 3. HLD:  -LDL goal<70 -Continuation of atorvastatin with prescribing, monitoring and management defer to PCP 4. DMII:  -A1c goal<6.5 -Stable.  Continue to follow PCP for monitoring and management 5. OSA on CPAP:  -Referral placed to [facility] sleep clinic to establish care with sleep provider -Advise further discussion at that time in regards to possible need of repeat sleep study especially with recent stroke and significant weight loss 6. Acid reflux symptoms:  -Advised to follow-up with PCP in regards to use of pantoprazole or potentially referral to GI for further evaluation    Follow up in 4 months or call earlier if needed   I spent 30 minutes of face-to-face and non-face-to-face time with patient and wife.  This included previsit chart review, lab review, study review, order entry, electronic health record documentation, patient education regarding history of stroke, importance of managing stroke risk factors and answered all questions to patient and wife satisfaction   Ihor AustinJessica McCue, AGNP-BC  [facility] [address] [place], [place] [id number]  Phone [phone] Fax [phone] Note: This document was prepared with digital dictation and possible smart phrase technology. Any transcriptional errors that result from this process are unintentional.

## 2020-04-15 NOTE — Progress Notes (Signed)
I agree with the above plan 

## 2020-04-26 ENCOUNTER — Telehealth: Payer: Self-pay | Admitting: Primary Care

## 2020-04-26 DIAGNOSIS — K219 Gastro-esophageal reflux disease without esophagitis: Secondary | ICD-10-CM

## 2020-04-26 MED ORDER — OMEPRAZOLE 2 MG/ML ORAL SUSPENSION: 20.0000 mg | Freq: Every day | ORAL | 0 refills | Status: DC

## 2020-04-26 NOTE — Telephone Encounter (Signed)
Spoken and notified patient's wife of Michael Bright comments. Verbalized understanding.

## 2020-04-26 NOTE — Telephone Encounter (Signed)
We could try liquid omeprazole, not sure if this will be covered.  I'll send to the pharmacy, have him do 10-20 mls into the tube once daily.  Have them update [place] if this is not covered.

## 2020-04-26 NOTE — Telephone Encounter (Signed)
Patient's wife called in stating patient is still having bad acid reflux. Spouse stated he was not able to get any relief and no medication from pharmacy. They would like to know what to do next. Please advise.

## 2020-05-01 ENCOUNTER — Encounter: Payer: BC Managed Care – PPO | Admitting: Physical Medicine & Rehabilitation

## 2020-05-09 ENCOUNTER — Ambulatory Visit (INDEPENDENT_AMBULATORY_CARE_PROVIDER_SITE_OTHER): Payer: BC Managed Care – PPO | Admitting: Neurology

## 2020-05-09 ENCOUNTER — Encounter: Payer: Self-pay | Admitting: Neurology

## 2020-05-09 VITALS — BP 130/74 | HR 98 | Ht 72.0 in | Wt 272.0 lb

## 2020-05-09 DIAGNOSIS — G464 Cerebellar stroke syndrome: Secondary | ICD-10-CM

## 2020-05-09 DIAGNOSIS — E669 Obesity, unspecified: Secondary | ICD-10-CM

## 2020-05-09 DIAGNOSIS — R634 Abnormal weight loss: Secondary | ICD-10-CM

## 2020-05-09 DIAGNOSIS — G4733 Obstructive sleep apnea (adult) (pediatric): Secondary | ICD-10-CM

## 2020-05-09 DIAGNOSIS — Z9989 Dependence on other enabling machines and devices: Secondary | ICD-10-CM

## 2020-05-09 NOTE — Progress Notes (Signed)
Subjective:    Patient ID: Michael Bright is a 64 y.o. male.  HPI     Huston Foley, MD, PhD [facility] [address], [address] [address] [place], [place] [address]  Dear Michael Bright,   I saw your patient, Michael Bright, upon your kind request, in my Sleep clinic today for initial consultation of his sleep disorder, in particular, evaluation of his prior diagnosis of obstructive sleep apnea.  The patient is accompanied by his wife today. As you know, [name] is a 64 year old right-handed gentleman with an underlying medical history of stroke of the brainstem, history of respiratory failure, kidney injury, history of sepsis, history of pneumonia, diabetes, and obesity, who was previously diagnosed with obstructive sleep apnea and placed on CPAP therapy.  He had sleep study testing in December 2000 and was diagnosed with severe obstructive sleep apnea.  AHI was 83.2/h, and O2 nadir was 57%.  Sleep study testing was through [facility] and the study was requested by Dr. Annalee Genta at the time.  The patient was placed on CPAP of 16 cm at the time.  He needs a new CPAP machine.  He has been using an Probation officer mask.  He has lost weight, current weight is about 30 pounds less than his documented weight at the time of his original sleep study.  This is his second CPAP machine but is probably 19 or 63 years old.  He has not received any recent supplies as he does not have a prescription for it he has taped up his mask with duct tape and held it together with fabric elastic. I reviewed your office note from 04/15/2020.  His Epworth sleepiness score is 19 out of 24, fatigue severity score is 52 out of 63. He goes to bed around 8 and rise time is around 4:30 AM.  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  He lives with his wife, their son lives with them, they  also have a grown daughter.  He does not smoke or drink alcohol or use caffeine.  He has a feeding tube. He reports compliance with his CPAP machine.  He reports that his pressure is 9 cm.  He is adopted, family history is not known.  He has nocturia about twice per average night, denies recurrent morning headaches.  His Past Medical History Is Significant For: Past Medical History:  Diagnosis Date  . Acute CVA (cerebrovascular accident) (HCC) 05/30/2019  . Acute kidney injury (HCC)   . Acute respiratory failure (HCC)   . Aspiration pneumonia (HCC) 06/14/2019  . 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 Diabetes (HCC)   . 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 (CVA) 08/09/2019  . Pneumonia    aspiration pneumonia  . Sepsis with acute renal failure without septic shock (HCC)   . Septic shock (HCC)   . Stroke (HCC)   . Urinary tract infection   . Urinary tract infection without hematuria     His Past Surgical History Is Significant For: Past Surgical History:  Procedure Laterality Date  . CHOLECYSTECTOMY    . IR GASTROSTOMY TUBE MOD SED  07/04/2019    His Family History Is Significant For: No family history on file.  His Social History Is Significant For: Social History   Socioeconomic History  . Marital status: Married    Spouse name: Not on  file  . Number of children: Not on file  . Years of education: Not on file  . Highest education level: Not on file  Occupational History  . Not on file  Tobacco Use  . Smoking status: Never Smoker  . Smokeless tobacco: Never Used  Vaping Use  . Vaping Use: Never used  Substance and Sexual Activity  . Alcohol use: Yes  . Drug use: No  . Sexual activity: Yes  Other Topics Concern  . Not on file  Social History Narrative  . Not on file   Social Determinants of Health   Financial Resource Strain:   . Difficulty of Paying Living Expenses:   Food Insecurity:   . Worried About Programme researcher, broadcasting/film/video in the Last  Year:   . Barista in the Last Year:   Transportation Needs:   . Freight forwarder (Medical):   Marland Kitchen Lack of Transportation (Non-Medical):   Physical Activity:   . Days of Exercise per Week:   . Minutes of Exercise per Session:   Stress:   . Feeling of Stress :   Social Connections:   . Frequency of Communication with Friends and Family:   . Frequency of Social Gatherings with Friends and Family:   . Attends Religious Services:   . Active Member of Clubs or Organizations:   . Attends Banker Meetings:   Marland Kitchen Marital Status:     His Allergies Are:  No Known Allergies:   His Current Medications Are:  Outpatient Encounter Medications as of 05/09/2020  Medication Sig  . Accu-Chek FastClix Lancets MISC USE AS DIRECTED UP TO FOUR TIMES DAILY  . ACCU-CHEK GUIDE test strip USE AS DIRECTED UP TO FOUR TIMES DAILY  . acetaminophen (TYLENOL) 325 MG tablet Place 1-2 tablets (325-650 mg total) into feeding tube every 4 (four) hours as needed for mild pain.  Marland Kitchen aspirin 81 MG chewable tablet Place 1 tablet (81 mg total) into feeding tube daily.  Marland Kitchen atorvastatin (LIPITOR) 40 MG tablet Take 1 tablet (40 mg total) by mouth at bedtime. For cholesterol.  . budesonide (PULMICORT) 0.25 MG/2ML nebulizer solution TAKE 2 MLS (0.25 MG TOTAL) BY NEBULIZATION 2 (TWO) TIMES DAILY.  . chlorhexidine (PERIDEX) 0.12 % solution 15 mLs by Mouth Rinse route 2 (two) times daily.  . finasteride (PROSCAR) 5 MG tablet PLACE ONE TABLET IN GI TUBE ONCE DAILY FOR URINE FLOW.  . fluticasone (FLONASE) 50 MCG/ACT nasal spray PLACE 1 SPRAY INTO BOTH NOSTRILS 2 (TWO) TIMES DAILY AS NEEDED FOR ALLERGIES OR RHINITIS.  Marland Kitchen gabapentin (NEURONTIN) 250 MG/5ML solution Take 2.5-5 mLs (125-250 mg total) by mouth at bedtime. For pain.  Marland Kitchen glycopyrrolate (ROBINUL) 1 MG tablet Place 2 tablets (2 mg total) into feeding tube 3 (three) times daily. For oral secretions.  Marland Kitchen guaifenesin (ROBITUSSIN) 100 MG/5ML syrup PLACE 10 MLS  (200 MG TOTAL) INTO FEEDING TUBE 4 (FOUR) TIMES DAILY - WITH MEALS AND AT BEDTIME.  . Insulin Glargine (BASAGLAR KWIKPEN) 100 UNIT/ML Inject 0.08 mLs (8 Units total) into the skin every morning.  . Insulin Pen Needle (BD PEN NEEDLE MICRO U/F) 32G X 6 MM MISC Use daily with insulin.  Marland Kitchen mouth rinse LIQD solution 15 mLs by Mouth Rinse route 5 (five) times daily.  . Nutritional Supplements (FEEDING SUPPLEMENT, OSMOLITE 1.5 CAL,) LIQD Place 325 mLs into feeding tube 4 (four) times daily. (Patient taking differently: Place 237 mLs into feeding tube 4 (four) times daily. )  . Sennosides (SENNA)  8.8 MG/5ML LIQD PLACE 5 MLS INTO FEEDING TUBE 2 (TWO) TIMES DAILY AS NEEDED FOR MILD CONSTIPATION.  Marland Kitchen. Water For Irrigation, Sterile (FREE WATER) SOLN Place 200 mLs into feeding tube every 8 (eight) hours. Use filtered or bottled water (not distilled water)  . omeprazole (FIRST-OMEPRAZOLE) 2 mg/mL SUSP oral suspension Place 10-20 mLs (20-40 mg total) into feeding tube daily. For heartburn. (Patient not taking: Reported on 05/09/2020)   No facility-administered encounter medications on file as of 05/09/2020.  :  Review of Systems:  Out of a complete 14 point review of systems, all are reviewed and negative with the exception of these symptoms as listed below: Review of Systems  Neurological:       Here for sleep consult. Prior study was in 2000. Pt has been on current cpap for a number of years would like to discuss getting a new machine. [employer] is his current supplier.   Epworth Sleepiness Scale 0= would never doze 1= slight chance of dozing 2= moderate chance of dozing 3= high chance of dozing  Sitting and reading:3 Watching TV:3 Sitting inactive in a public place (ex. Theater or meeting):3 As a passenger in a car for an hour without a break:3 Lying down to rest in the afternoon:3 Sitting and talking to someone:1 Sitting quietly after lunch (no alcohol):3 In a car, while stopped in traffic:0 Total:19      Objective:  Neurological Exam  Physical Exam Physical Examination:   Vitals:   05/09/20 0752  BP: 130/74  Pulse: 98  SpO2: 98%    General Examination: The patient is a very pleasant 64 y.o. male in no acute distress. He appears well-developed and well-nourished and well groomed.   HEENT: Normocephalic, atraumatic, pupils are equal, round and reactive to light, extraocular tracking is good without limitation to gaze excursion or nystagmus noted. Hearing is grossly intact. Face is symmetric with normal facial animation. Speech with mild dysarthria noted.  Oropharynx exam reveals: No significant mouth dryness, tongue protrudes centrally in palate elevates symmetrically, mildly crowded airway secondary to redundant soft palate, tonsillar size small, slightly wider tongue.  Mallampati class II.  Neck circumference of 16-1/2 inches.  Unremarkable scar from tracheostomy.  Chest: Clear to auscultation without wheezing, rhonchi or crackles noted.  Heart: S1+S2+0, regular and normal without murmurs, rubs or gallops noted.   Abdomen: Soft, non-tender and non-distended with normal bowel sounds appreciated on auscultation.  Extremities: There is no pitting edema in the distal lower extremities bilaterally.   Skin: Warm and dry without trophic changes noted.   Musculoskeletal: exam reveals no obvious joint deformities, tenderness or joint swelling or erythema.   Neurologically:  Mental status: The patient is awake, alert and oriented in all 4 spheres. His immediate and remote memory, attention, language skills and fund of knowledge are appropriate. There is no evidence of aphasia, agnosia, apraxia or anomia. Speech is clear with normal prosody and enunciation. Thought process is linear. Mood is normal and affect is normal.  Cranial nerves II - XII are as described above under HEENT exam.  Motor exam: Normal bulk, fairly normal tone, mild weakness in the left leg, slight right hand grip  weakness noted.  He walks with a walker.  No obvious dysmetria or intention tremor on cerebellar testing.   Sensory exam: intact to light touch.   Assessment and Plan:  In summary, Teodoro KilRandolph Avena is a very pleasant 64 y.o.-year old male with an underlying medical history of stroke of the brainstem, history of respiratory  failure, kidney injury, history of sepsis, history of pneumonia, diabetes, and obesity, who presents for evaluation of his obstructive sleep apnea.  He was diagnosed with severe obstructive sleep apnea nearly 21 years ago.  He has been on CPAP therapy and reports compliance with treatment.  He is in need for new supplies.  We will have him stop in the sleep lab after checking out to see if we can provide at least a new mask and if need be a new tubing for him as he has the mask held together with duct tape.  He is commended on his dedication to CPAP therapy.  He requires new equipment.  We will proceed with a home sleep test to facilitate evaluation and in order to confirm his diagnosis a home sleep test would be adequate.  He is willing to proceed.  I explained the home sleep test procedure to the patient and his wife and she can certainly pick up the equipment on his behalf.   We talked about the diagnosis of OSA, its prognosis and treatment options. We talked about medical treatments, surgical interventions and non-pharmacological approaches. I explained in particular the risks and ramifications of untreated moderate to severe OSA, especially with respect to developing cardiovascular disease down the Road, including congestive heart failure, difficult to treat hypertension, cardiac arrhythmias, or stroke. Even type 2 diabetes has, in part, been linked to untreated OSA. Symptoms of untreated OSA include daytime sleepiness, memory problems, mood irritability and mood disorder such as depression and anxiety, lack of energy, as well as recurrent headaches, especially morning headaches. We talked  about trying to maintain a healthy lifestyle in general, the importance of secondary stroke prevention, which includes sleep apnea management and weight control.  He has been on CPAP therapy for years.  He should qualify for new equipment.  I recommended the following at this time: home sleep test.  I plan to see him back after testing, we will also call them with the results by phone call.  I answered all the questions today and the patient and his wife were in agreement.   Thank you very much for allowing me to participate in the care of this nice patient. If I can be of any further assistance to you please do not hesitate to talk to me.   Sincerely,   Huston Foley, MD, PhD

## 2020-05-09 NOTE — Patient Instructions (Addendum)
It was nice to meet you both today.  Here is what we discussed today and what we came up with as our plan for you:    Based on your symptoms and your exam I believe you are still at risk for obstructive sleep apnea and would benefit from reevaluation as it has been many years and you need new supplies and an updated machine. You have also had interim weight loss.  Therefore, I think we should proceed with a home sleep test for re-evaluation and I will likely prescribe a new machine called autoPAP.   Please remember, the risks and ramifications of moderate to severe obstructive sleep apnea or OSA are: Cardiovascular disease, including congestive heart failure, stroke, difficult to control hypertension, arrhythmias, and even type 2 diabetes has been linked to untreated OSA. Sleep apnea causes disruption of sleep and sleep deprivation in most cases, which, in turn, can cause recurrent headaches, problems with memory, mood, concentration, focus, and vigilance. Most people with untreated sleep apnea report excessive daytime sleepiness, which can affect their ability to drive. Please do not drive if you feel sleepy.   I will likely see you back after your sleep study to go over the test results and where to go from there. We will call you after your sleep study to advise about the results (most likely, you will hear from [facility], my nurse) and to set up an appointment at the time, as necessary.    Our sleep lab administrative assistant will call you to schedule your sleep study. If you don't hear back from her by about 2 weeks from now, please feel free to call her at [phone]. You can leave a message with your phone number and concerns, if you get the voicemail box. She will call back as soon as possible.

## 2020-06-04 IMAGING — DX PORTABLE CHEST - 1 VIEW
2 series · 2 of 2 positions shown · non-contrast
Comparison: Portable exam 7868 hours compared to 06/04/2019

CLINICAL DATA: Post tracheostomy

EXAM:
PORTABLE CHEST 1 VIEW

[chest ap (1 of 2)]
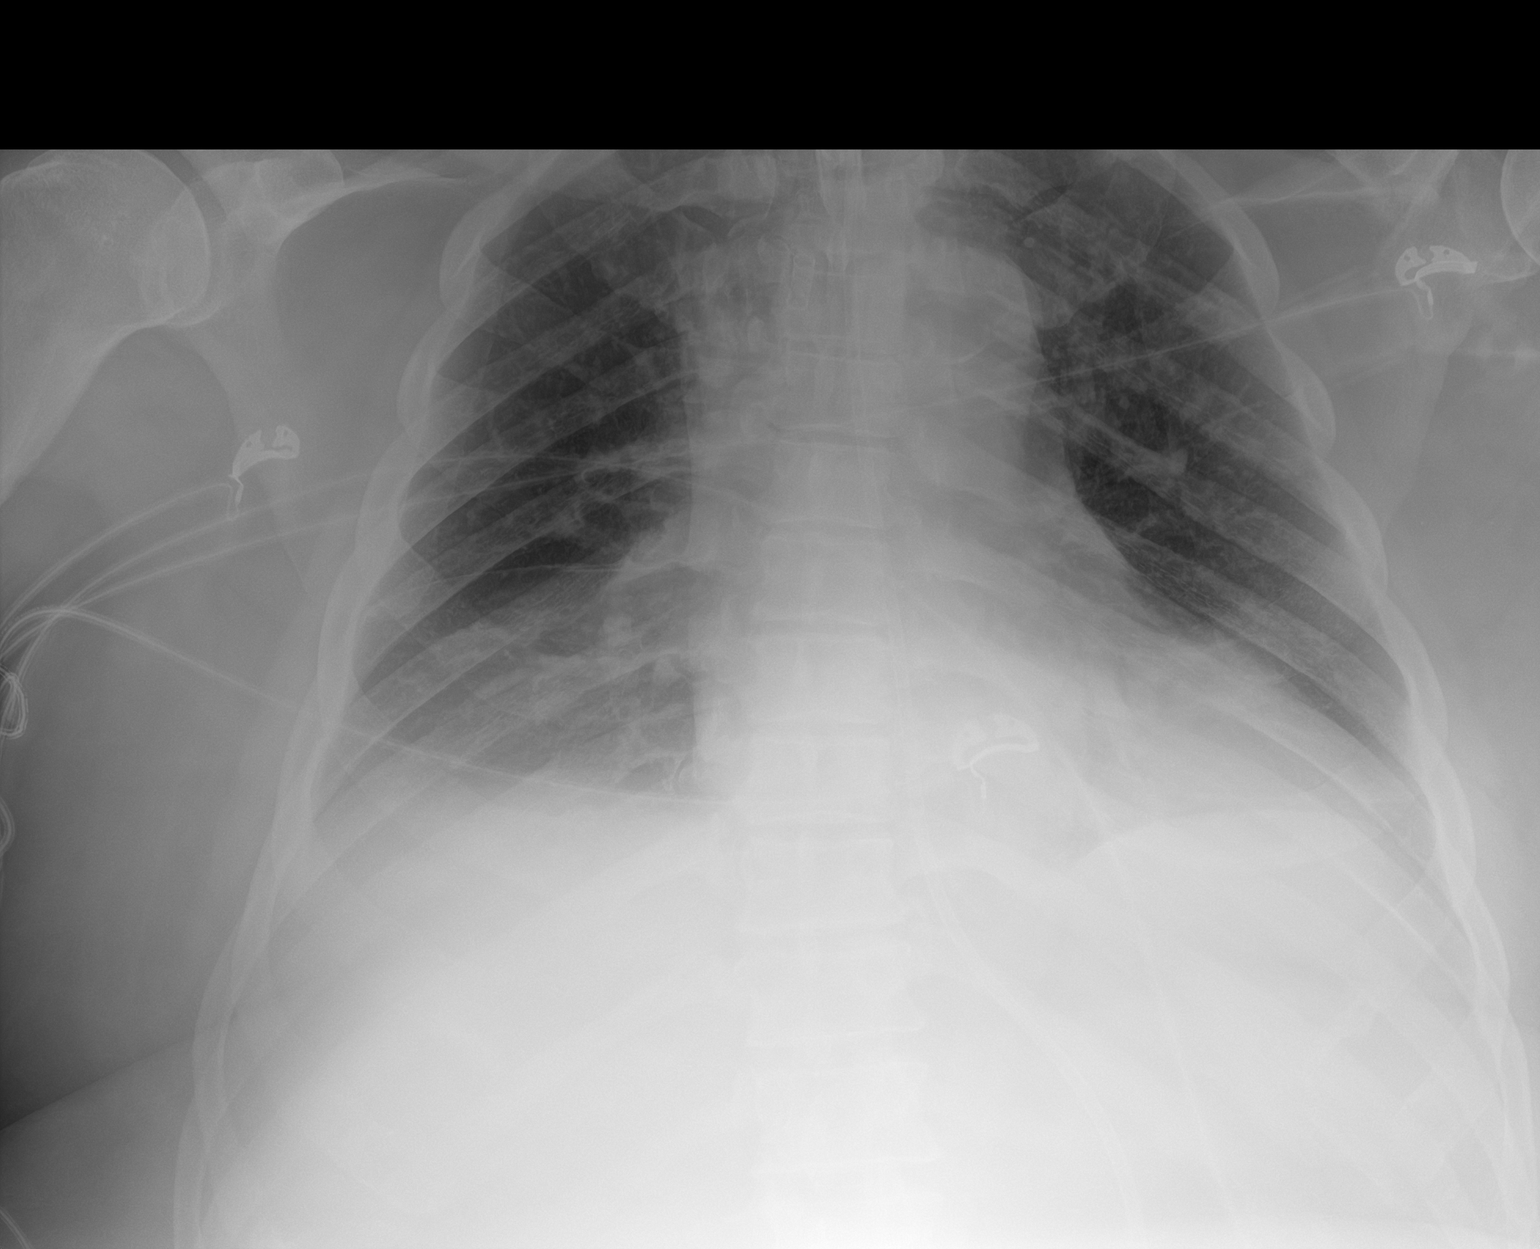

[chest ap (2 of 2)]
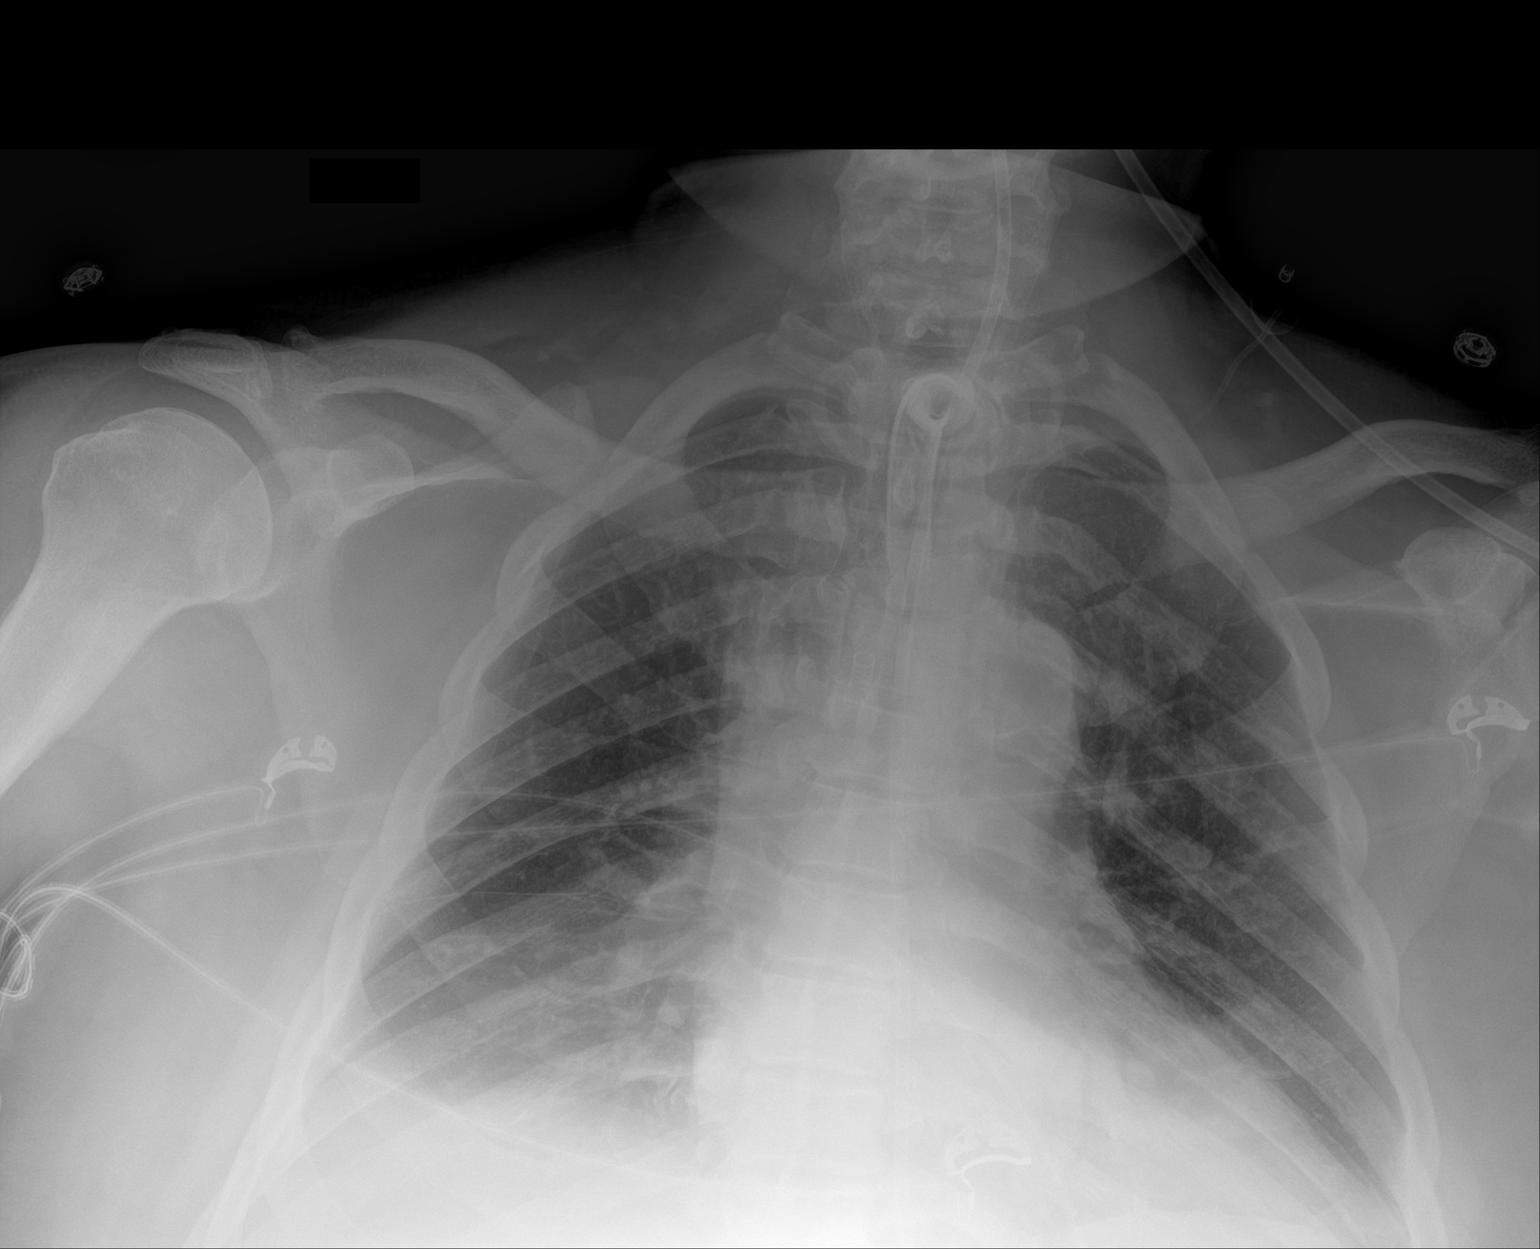

[2 of 2 positions shown; findings below may reference images not displayed]

FINDINGS: Tracheostomy tube present projecting over tracheal air column.

Borderline enlargement of cardiac silhouette.

Mediastinal contour stable.

Bibasilar atelectasis.

Upper lungs clear.

No pleural effusion or pneumothorax.
IMPRESSION: New tracheostomy tube.

Bibasilar atelectasis.

## 2020-06-05 IMAGING — DX PORTABLE CHEST - 1 VIEW
1 series · 1 of 1 positions shown · non-contrast
Comparison: Chest radiograph 06/09/2019

CLINICAL DATA: Weakness.

EXAM:
PORTABLE CHEST 1 VIEW

[chest ap]
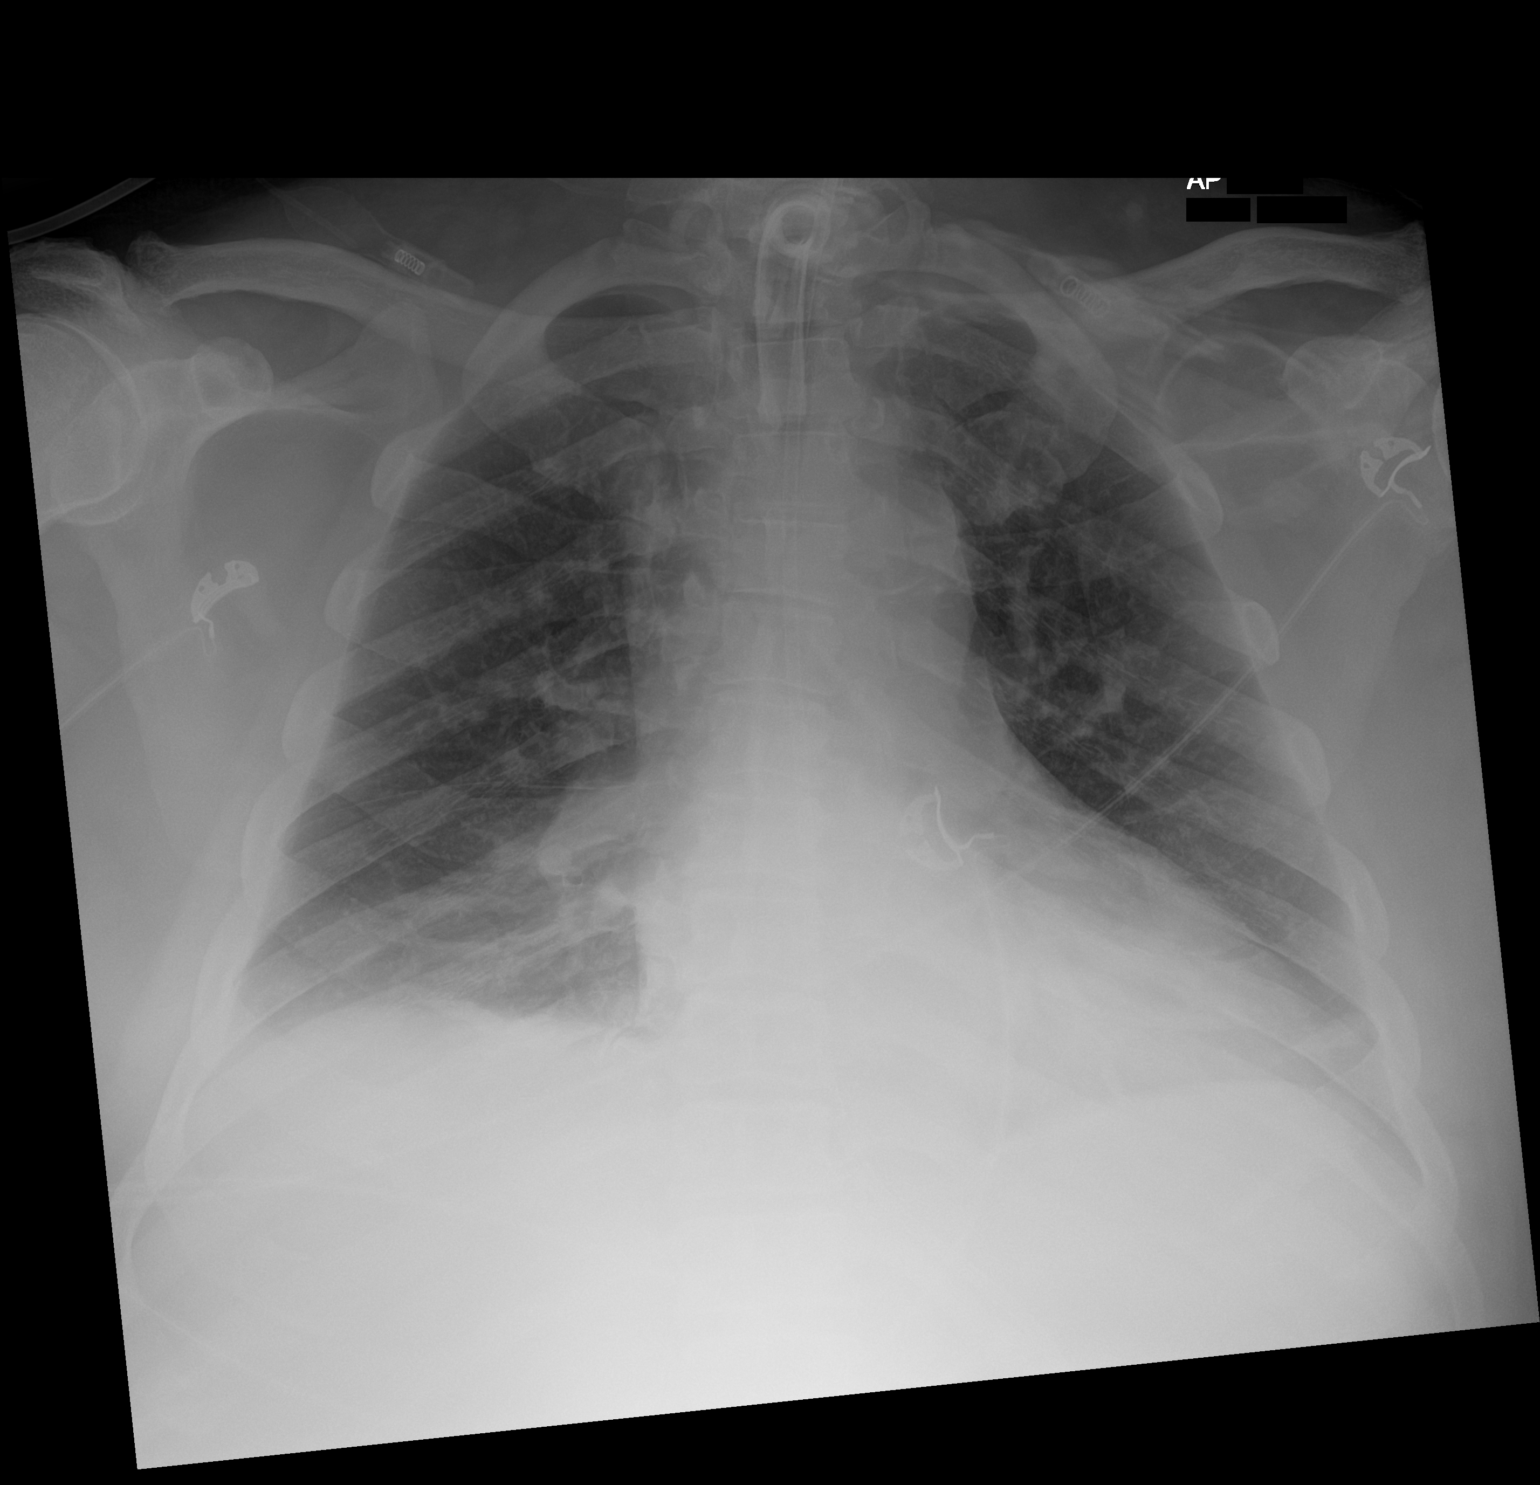

[1 of 1 positions shown; findings below may reference images not displayed]

FINDINGS: Tracheostomy tube mid trachea. Enteric tube courses inferior to the
diaphragm. Monitoring leads overlie the patient. Stable
cardiomegaly. Similar-appearing bilateral mid and lower lung patchy
areas of consolidation. No pleural effusion or pneumothorax.
IMPRESSION: Similar bibasilar opacities favored to represent atelectasis.

## 2020-06-06 ENCOUNTER — Other Ambulatory Visit: Payer: Self-pay | Admitting: Primary Care

## 2020-06-06 DIAGNOSIS — K5900 Constipation, unspecified: Secondary | ICD-10-CM

## 2020-06-07 IMAGING — DX ABDOMEN - 1 VIEW
1 series · 3 of 3 positions shown · non-contrast
Comparison: 06/10/2019

CLINICAL DATA: Reason for exam: vomiting Patient reports vomiting
started last night. And has had constipation for more than 1 week.
wore mask and shield.

EXAM:
ABDOMEN - 1 VIEW

[Series 1: abdomen · 0.14mm/px · 3 of 3 slices shown]
[im 1/3]
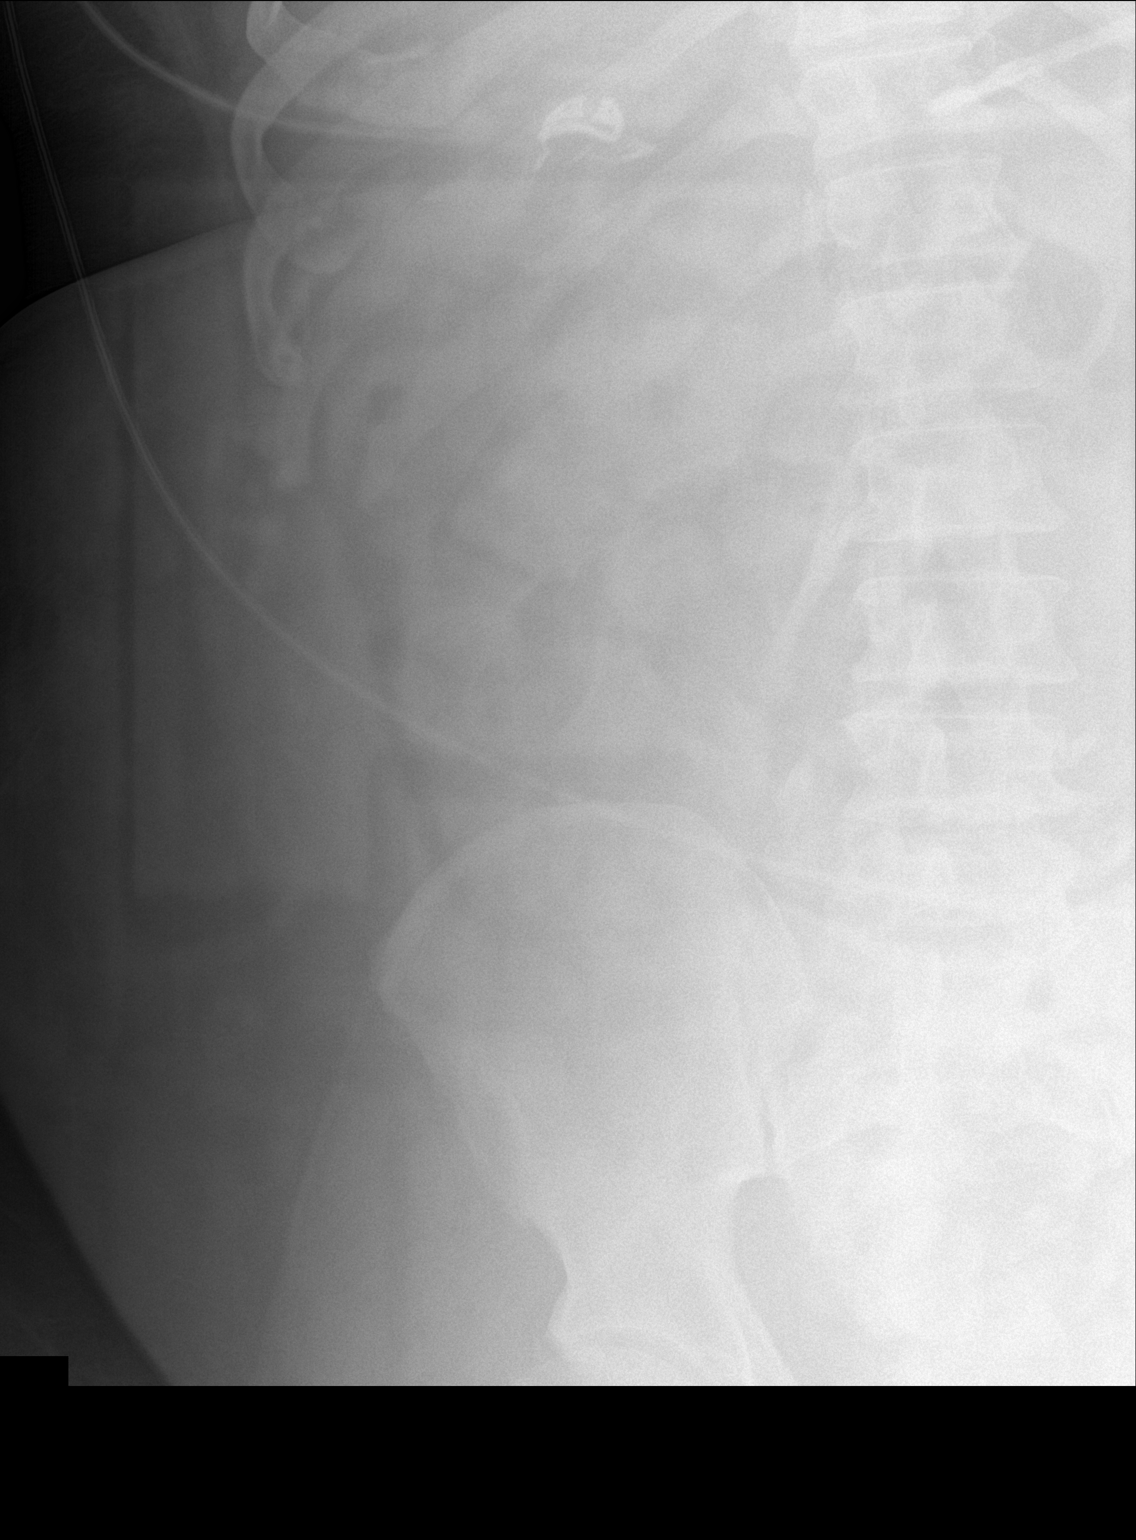
[im 2/3]
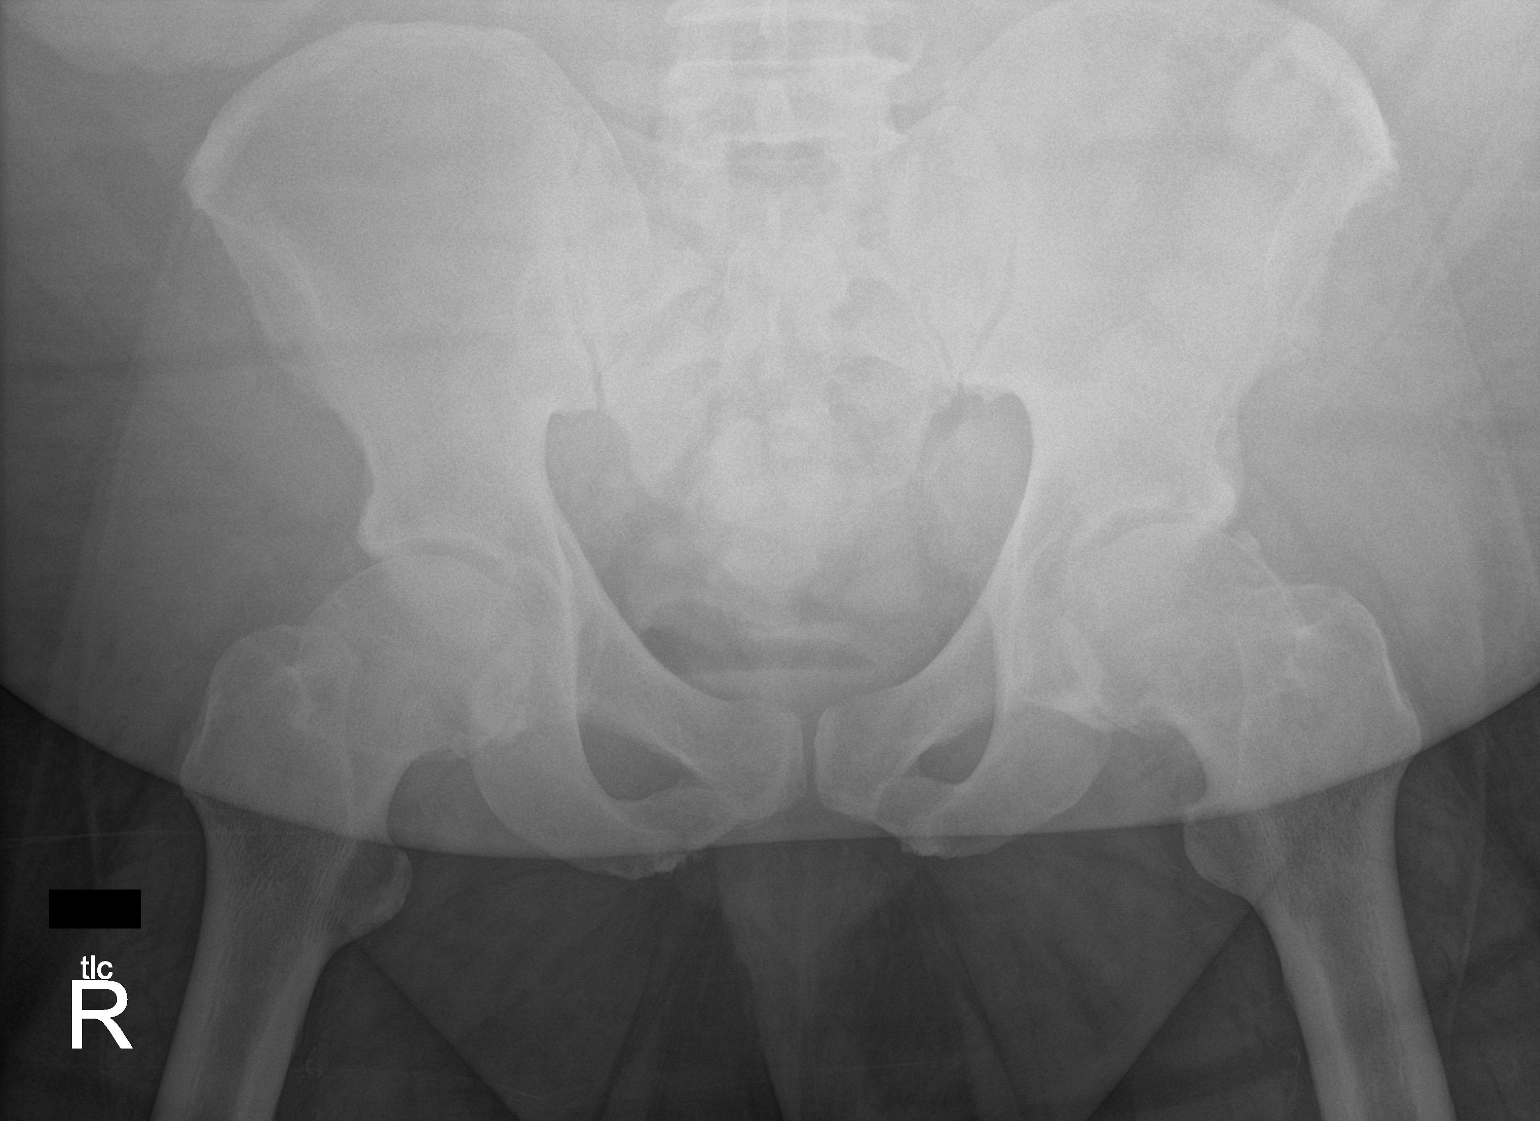
[im 3/3]
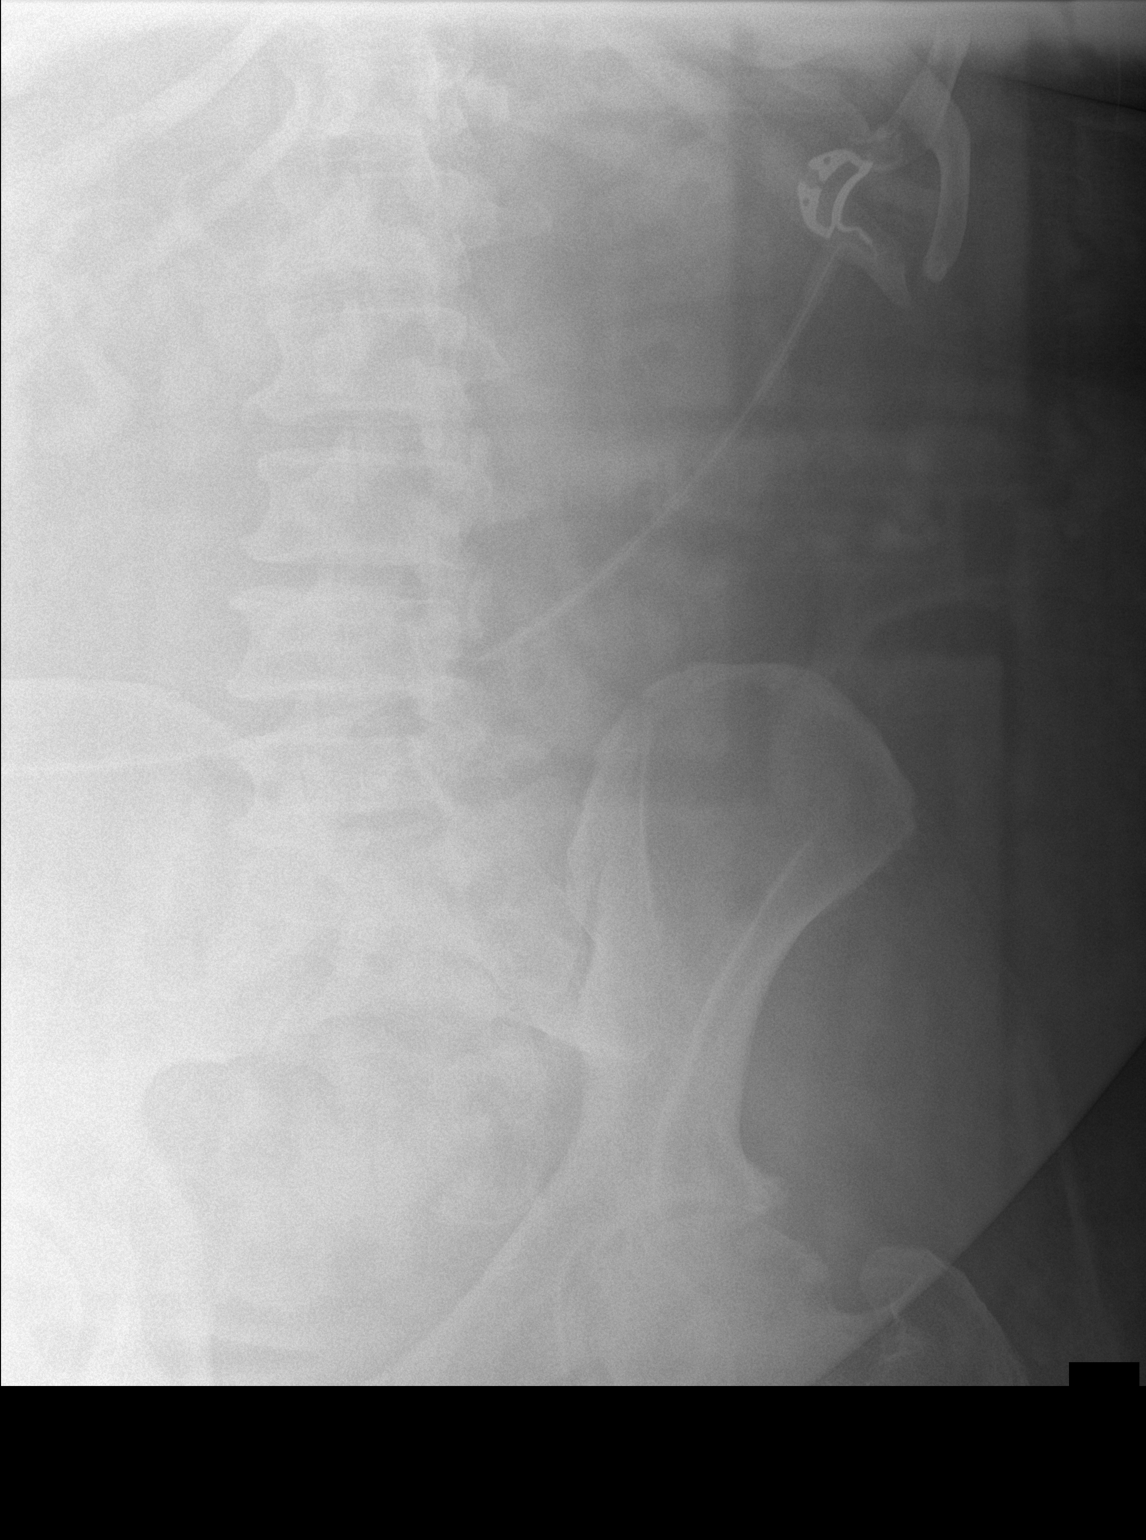

[3 of 3 positions shown; findings below may reference images not displayed]

FINDINGS: The bowel gas pattern is normal. No radio-opaque calculi or other
significant radiographic abnormality are seen.
IMPRESSION: Negative.

## 2020-06-08 IMAGING — DX CHEST  1 VIEW
1 series · 1 of 1 positions shown · non-contrast
Comparison: Single-view of the chest 06/10/2019, 06/09/2019 and
06/04/2019.

CLINICAL DATA: Fever.

EXAM:
CHEST  1 VIEW

[chest]
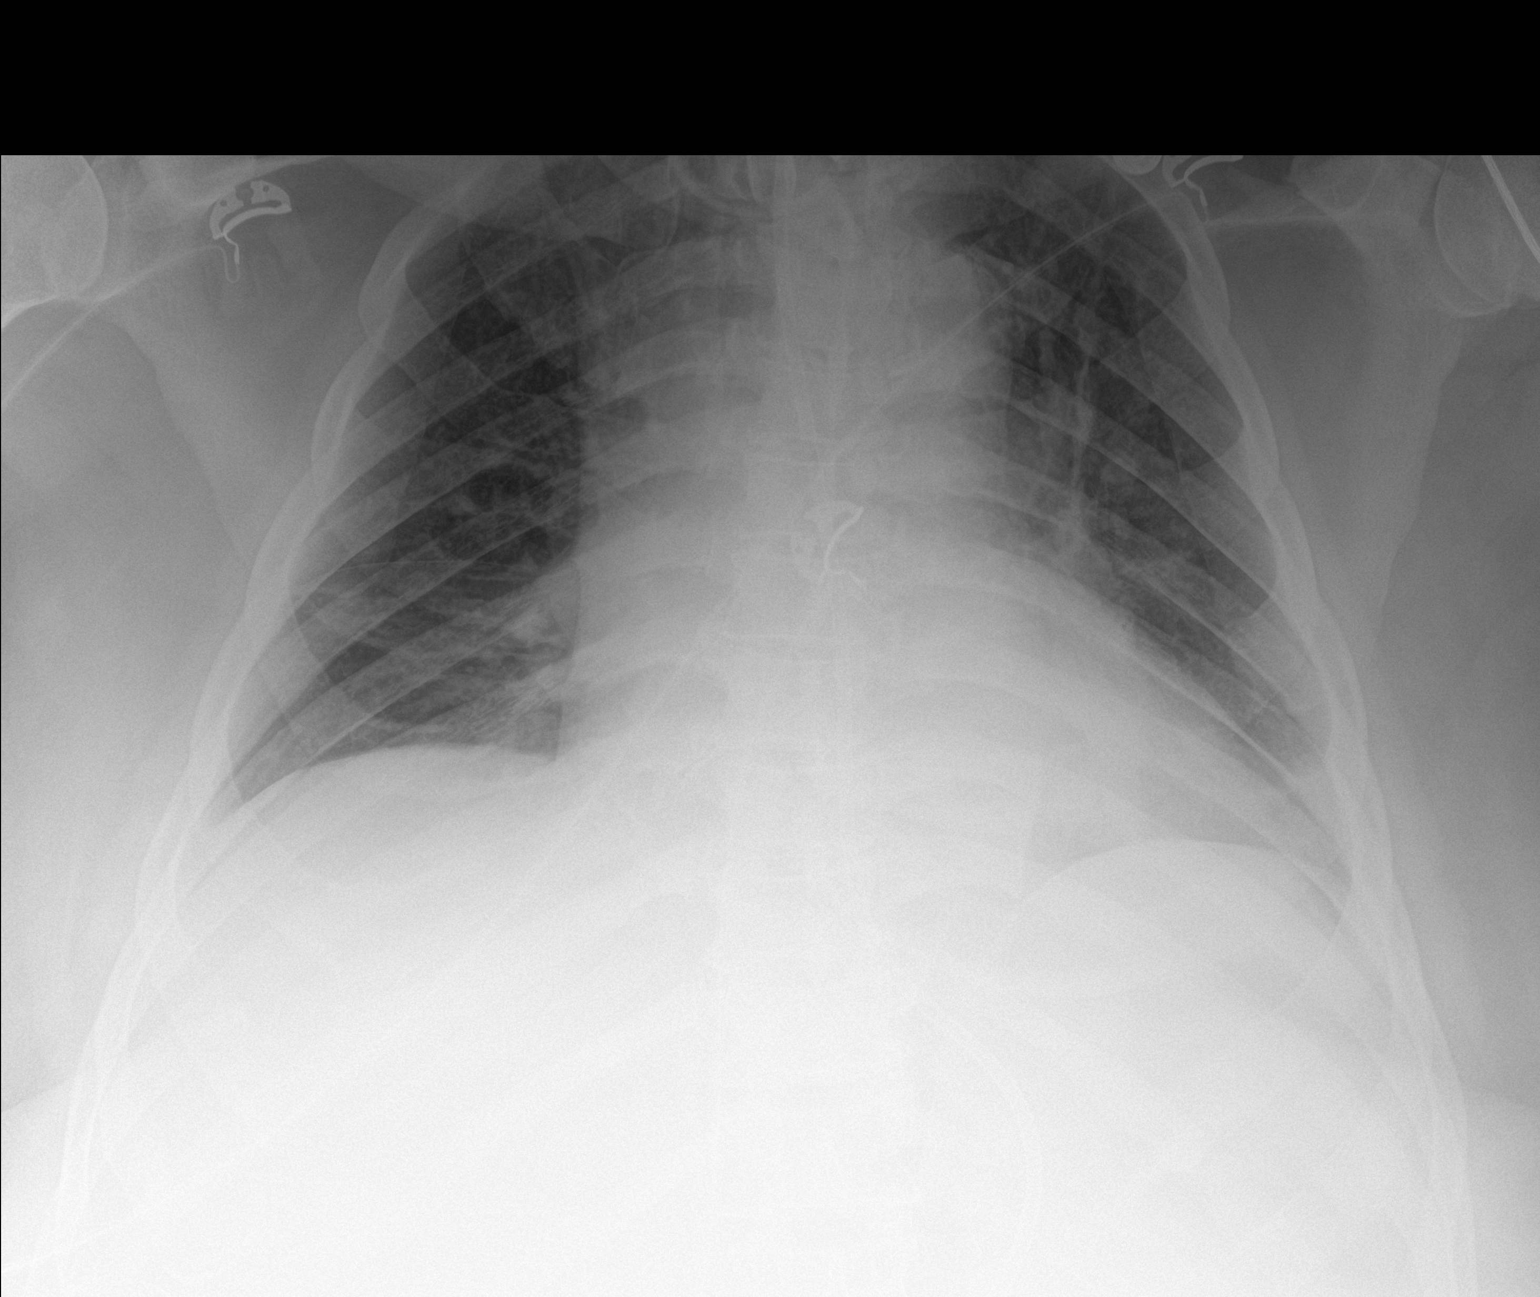

[1 of 1 positions shown; findings below may reference images not displayed]

FINDINGS: Tracheostomy tube and feeding tube are in place. Mild bibasilar
opacities are improved comparison to yesterday's examination. No
pneumothorax or pleural effusion. Cardiomegaly.
IMPRESSION: Improved bibasilar opacities compared to the most recent examination
most compatible with decreased atelectasis. No new abnormality.

## 2020-06-10 IMAGING — DX PORTABLE CHEST - 1 VIEW
1 series · 1 of 1 positions shown · non-contrast
Comparison: June 13, 2019

CLINICAL DATA: 63-year-old male with a history of dyspnea

EXAM:
PORTABLE CHEST 1 VIEW

[chest]
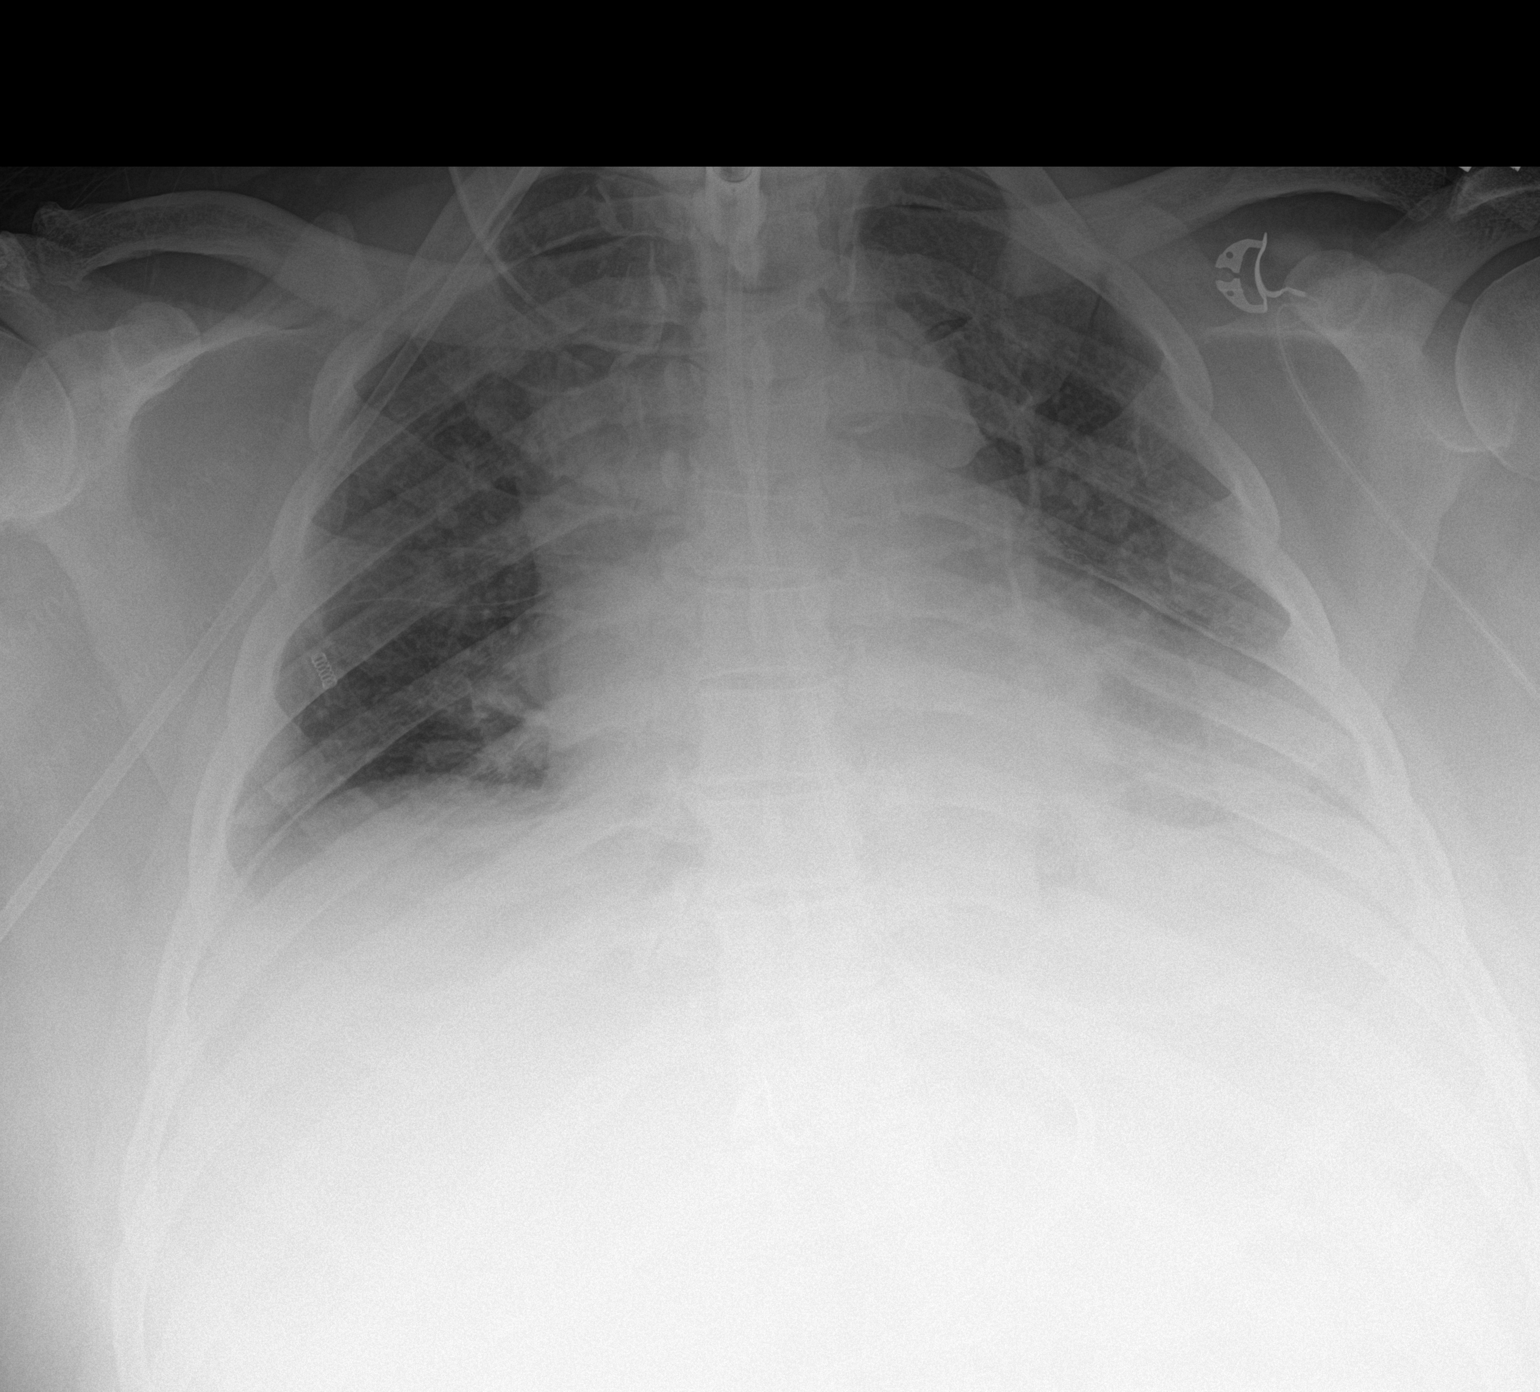

[1 of 1 positions shown; findings below may reference images not displayed]

FINDINGS: Cardiomediastinal silhouette unchanged in size and contour.
Unchanged tracheostomy.

Unchanged enteric feeding tube terminating out of the field of view.

Low lung volumes with reticular opacities bilaterally. No
pneumothorax or pleural effusion. No new confluent airspace disease.
IMPRESSION: Low lung volumes, with reticular opacities potentially representing
atelectasis or mild edema.

Unchanged tracheostomy.

Unchanged enteric feeding tube

## 2020-06-12 ENCOUNTER — Ambulatory Visit (INDEPENDENT_AMBULATORY_CARE_PROVIDER_SITE_OTHER): Payer: BC Managed Care – PPO | Admitting: Neurology

## 2020-06-12 DIAGNOSIS — Z9989 Dependence on other enabling machines and devices: Secondary | ICD-10-CM

## 2020-06-12 DIAGNOSIS — R634 Abnormal weight loss: Secondary | ICD-10-CM

## 2020-06-12 DIAGNOSIS — E669 Obesity, unspecified: Secondary | ICD-10-CM

## 2020-06-12 DIAGNOSIS — G4733 Obstructive sleep apnea (adult) (pediatric): Secondary | ICD-10-CM | POA: Diagnosis not present

## 2020-06-12 DIAGNOSIS — G464 Cerebellar stroke syndrome: Secondary | ICD-10-CM

## 2020-06-19 ENCOUNTER — Encounter
Payer: BC Managed Care – PPO | Attending: Physical Medicine & Rehabilitation | Admitting: Physical Medicine & Rehabilitation

## 2020-06-19 ENCOUNTER — Other Ambulatory Visit: Payer: Self-pay

## 2020-06-19 ENCOUNTER — Encounter: Payer: Self-pay | Admitting: Physical Medicine & Rehabilitation

## 2020-06-19 VITALS — BP 146/80 | HR 69 | Temp 98.8°F | Ht 72.0 in | Wt 263.0 lb

## 2020-06-19 DIAGNOSIS — E669 Obesity, unspecified: Secondary | ICD-10-CM

## 2020-06-19 DIAGNOSIS — E1169 Type 2 diabetes mellitus with other specified complication: Secondary | ICD-10-CM

## 2020-06-19 DIAGNOSIS — I69391 Dysphagia following cerebral infarction: Secondary | ICD-10-CM

## 2020-06-19 DIAGNOSIS — Z8673 Personal history of transient ischemic attack (TIA), and cerebral infarction without residual deficits: Secondary | ICD-10-CM | POA: Diagnosis present

## 2020-06-19 DIAGNOSIS — G464 Cerebellar stroke syndrome: Secondary | ICD-10-CM

## 2020-06-19 NOTE — Patient Instructions (Addendum)
LEGS:  COMPRESSION STOCKINGS LEG ELEVATOIN DIABETES CONTROL REGULAR INSPECTION OF YOUR SKIN

## 2020-06-19 NOTE — Progress Notes (Signed)
Subjective:    Patient ID: Michael Bright, male    DOB: 04-Jun-1956, 64 y.o.   MRN: [id number]  HPI   Michael Bright is here in follow up of his medullary infarct.  He mentions that he's had some pain as well as heaviness and swelling in both of his lower legs especially along the anterior part of his legs. It tends to be worst in the morning or if he has been sitting for long periods of time. I asked how his diabetes has been controlled and his sugars have usually been around 100.   He was found to have esophageal strictures on EGD after SLP referred him to GI. These were stretched on 8/16 by GI. He hasn't noticed much of a change since the dilation however. He has follow up later this month. He remains NPO  No falls or mishaps at home. Bowels are moving with laxatives.     Pain Inventory Average Pain 5 Pain Right Now 5 My pain is intermittent and aching  LOCATION OF PAIN lowe legs, top of feet  BOWEL Number of stools per week: 2 Oral laxative use Yes  Type of laxative senna Enema or suppository use No  History of colostomy No  Incontinent No   BLADDER Normal In and out cath, frequency N/A Able to self cath No  Bladder incontinence No  Frequent urination No  Leakage with coughing No  Difficulty starting stream No  Incomplete bladder emptying No    Mobility use a walker how many minutes can you walk? 30 MINS ability to climb steps?  yes do you drive?  no Do you have any goals in this area?  yes  Function disabled: date disabled 2000  Neuro/Psych weakness trouble walking  Prior Studies Any changes since last visit?  yes Transnasal, esophageal endoscopy 06/03/2020 at [facility]  Physicians involved in your care Any changes since last visit?  no   Family History  Adopted: Yes   Social History   Socioeconomic History  . Marital status: Married    Spouse name: Not on file  . Number of children: Not on file  . Years of education: Not on file  . Highest education  level: Not on file  Occupational History  . Not on file  Tobacco Use  . Smoking status: Never Smoker  . Smokeless tobacco: Never Used  Vaping Use  . Vaping Use: Never used  Substance and Sexual Activity  . Alcohol use: Yes  . Drug use: No  . Sexual activity: Yes  Other Topics Concern  . Not on file  Social History Narrative  . Not on file   Social Determinants of Health   Financial Resource Strain:   . Difficulty of Paying Living Expenses: Not on file  Food Insecurity:   . Worried About Programme researcher, broadcasting/film/video in the Last Year: Not on file  . Ran Out of Food in the Last Year: Not on file  Transportation Needs:   . Lack of Transportation (Medical): Not on file  . Lack of Transportation (Non-Medical): Not on file  Physical Activity:   . Days of Exercise per Week: Not on file  . Minutes of Exercise per Session: Not on file  Stress:   . Feeling of Stress : Not on file  Social Connections:   . Frequency of Communication with Friends and Family: Not on file  . Frequency of Social Gatherings with Friends and Family: Not on file  . Attends Religious Services: Not on file  .  Active Member of Clubs or Organizations: Not on file  . Attends Banker Meetings: Not on file  . Marital Status: Not on file   Past Surgical History:  Procedure Laterality Date  . CHOLECYSTECTOMY    . IR GASTROSTOMY TUBE MOD SED  07/04/2019   Past Medical History:  Diagnosis Date  . Acute CVA (cerebrovascular accident) (HCC) 05/30/2019  . Acute kidney injury (HCC)   . Acute respiratory failure (HCC)   . Aspiration pneumonia (HCC) 06/14/2019  . 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 Diabetes (HCC)   . 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 (CVA) 08/09/2019  . Pneumonia    aspiration pneumonia  . Sepsis with acute renal failure without septic shock (HCC)   . Septic shock (HCC)   . Stroke (HCC)   . Urinary tract infection   . Urinary tract infection without hematuria      BP (!) 146/80   Pulse 69   Temp 98.8 F (37.1 C)   Ht 6' (1.829 m)   Wt 263 lb (119.3 kg)   SpO2 96%   BMI 35.67 kg/m   Opioid Risk Score:   Fall Risk Score:  `1  Depression screen PHQ 2/9  Depression screen [facility] 2/9 09/12/2019 08/22/2019  Decreased Interest 0 0  Down, Depressed, Hopeless 0 0  PHQ - 2 Score 0 0  Altered sleeping 0 0  Tired, decreased energy 3 3  Change in appetite 0 0  Feeling bad or failure about yourself  0 0  Trouble concentrating 0 0  Moving slowly or fidgety/restless 0 0  Suicidal thoughts 0 0  PHQ-9 Score 3 3  Difficult doing work/chores - Not difficult at all   Review of Systems  Constitutional: Negative.   HENT: Negative.   Eyes: Negative.   Respiratory: Negative.   Cardiovascular: Negative.   Gastrointestinal: Negative.   Endocrine: Negative.   Genitourinary: Negative.   Musculoskeletal: Positive for gait problem.       Pain in both legs  Skin: Negative.   Hematological: Negative.   Psychiatric/Behavioral: Negative.   All other systems reviewed and are negative.      Objective:   Physical Exam  General: No acute distress HEENT: EOMI, oral membranes moist Cards: reg rate  Chest: normal effort Abdomen: Soft, NT, ND Skin: dry, intact Extremities: tr LE edema  Neuro: speech clearer, better phonation.  sensory loss right arm and legss bilaterally below knees.     Marland Kitchen ataxic LUE and LLE. Gait improves although sl wide based.Marland Kitchen strength nearly 5/5 in all 4's.  Psych: Pt's affect is appropriate. Pt is cooperative Musculoskeletal: left 4th/3rd finger contractures still present. 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 OSA on CPAP: PCP Following.  3  Pain: maintain gabapentin for neuropathic pain.    -his leg symptoms are related to his stroke and likely diabetes  -  continue  4. Fatigue             -optimize sleep             -using CPAP            -some improvement in energy with the change in season  5.  Mild Dupuytren's contractures of the left third and fourth fingers             - improved   Fifteen minutes of face to face patient care time were spent during this visit. All questions were encouraged and answered.  Follow up with me in 6 mos .

## 2020-06-26 ENCOUNTER — Telehealth: Payer: Self-pay

## 2020-06-26 NOTE — Progress Notes (Signed)
Patient referred by [name], seen by me on 05/09/2020 for reevaluation of his longstanding history of sleep apnea.  He has an older machine.  He needed new equipment.  He had a home sleep test on 06/12/2020 which confirmed severe obstructive sleep apnea.  Please advise patient that I will write for new equipment and a new machine called AutoPap.  We can use the DME company of his choice, if he has 1 or we can choose a new DME company locally.  He will need a follow-up appointment in about 10 to 12 weeks after set up.  He can be scheduled with [name] as well.   Huston Foley, MD, PhD [facility])

## 2020-06-26 NOTE — Addendum Note (Signed)
Addended by: Huston Foley on: 06/26/2020 07:26 AM   Modules accepted: Orders

## 2020-06-26 NOTE — Procedures (Signed)
Patient Information     First Name: Michael Last Name: Treyton Bright: [id number]  Birth Date: 11/10/2055 Age: 64 Gender:  Referring Provider: Doreene Nest, NP BMI: 36.7 (W=271 lb, H=6' 0'')  Neck Circ.:  16 '' Epworth:  19/24   Sleep Study Information    Study Date: 06/12/20 S/H/A Version: [id number] / 4.1.1528 / 18  History:    64 year old man with a history of stroke of the brainstem, history of respiratory failure, kidney injury, history of sepsis, history of pneumonia, diabetes, and obesity, who was previously diagnosed with obstructive sleep apnea and placed on CPAP therapy. His sleep study from 2000 showed an AHI of 83.2/h, and O2 nadir was 57%.  Summary & Diagnosis:     Severe OSA Recommendations:     This home sleep test demonstrates severe obstructive sleep apnea with a total AHI of 45/hour and O2 nadir of 77%. The patient has been on PAP therapy for over 20 years. He will be advised to start treatment with new equipment and supplies in the form of autoPAP titration/trial at home for now. A full night titration study may be considered if needed down the road to optimize treatment. Please note that untreated obstructive sleep apnea may carry additional perioperative morbidity. Patients with significant obstructive sleep apnea should receive perioperative PAP therapy and the surgeons and particularly the anesthesiologist should be informed of the diagnosis and the severity of the sleep disordered breathing. The patient should be cautioned not to drive, work at heights, or operate dangerous or heavy equipment when tired or sleepy. Review and reiteration of good sleep hygiene measures should be pursued with any patient. Other causes of the patient's symptoms, including circadian rhythm disturbances, an underlying mood disorder, medication effect and/or an underlying medical problem cannot be ruled out based on this test. Clinical correlation is recommended. The patient and his referring  provider will be notified of the test results. The patient will be seen in follow up in sleep clinic at [facility].  I certify that I have reviewed the raw data recording prior to the issuance of this report in accordance with the standards of the American Academy of Sleep Medicine (AASM).  Huston Foley, MD, PhD [facility]) Diplomat, ABPN (Neurology and Sleep)             Sleep Summary  Oxygen Saturation Statistics   Start Study Time: End Study Time: Total Recording Time:  8:50:19 PM 6:54:20 AM 10 h, 4 min  Total Sleep Time % REM of Sleep Time:  7 h, 18 min  13.5    Mean: 91 Minimum: 77 Maximum: 97  Mean of Desaturations Nadirs (%):   89  Oxygen Desaturation. %:   4-9 10-20 >20 Total  Events Number Total   242  13 94.9 5.1  0 0.0  255 100.0  Oxygen Saturation: <90 <=88 <85 <80 <70  Duration (minutes): Sleep % 45.0 10.3 27.4 2.8 6.2 0.6 0.2 0.0 0.0 0.0     Respiratory Indices      Total Events REM NREM All Night  pRDI:  327  pAHI:  327 ODI:  255  pAHIc:  61  % CSR: 0.0 53.4 53.4 37.0 9.1 43.7 43.7 34.8 15.9 45.0 45.0 35.1 15.2       Pulse Rate Statistics during Sleep (BPM)      Mean: 82 Minimum: 61 Maximum: 111    Indices are calculated using technically valid sleep time of 7 h, 15 min. Central-Indices are  calculated using technically valid sleep time of 4 h, 1 min. pRDI/pAHI are calculated using oxi desaturations ? 3%  Body Position Statistics  Position Supine Prone Right Left Non-Supine  Sleep (min) 438.0 0.0 0.0 0.0 0.0  Sleep % 100.0 0.0 0.0 0.0 0.0  pRDI 45.0 N/A N/A N/A N/A  pAHI 45.0 N/A N/A N/A N/A  ODI 35.1 N/A N/A N/A N/A     Snoring Statistics Snoring Level (dB) >40 >50 >60 >70 >80 >Threshold (45)  Sleep (min) 154.7 16.1 5.2 0.0 0.0 32.3  Sleep % 35.3 3.7 1.2 0.0 0.0 7.4    Mean: 41 dB Sleep Stages Chart                                                                    pAHI=45.0                                                                                 Mild              Moderate                    Severe                                                 5              15                    30

## 2020-06-26 NOTE — Telephone Encounter (Signed)
-----   Message from Huston Foley, MD sent at 06/26/2020  7:26 AM EDT ----- Patient referred by [name], seen by me on 05/09/2020 for reevaluation of his longstanding history of sleep apnea.  He has an older machine.  He needed new equipment.  He had a home sleep test on 06/12/2020 which confirmed severe obstructive sleep apnea.  Please advise patient that I will write for new equipment and a new machine called AutoPap.  We can use the DME company of his choice, if he has 1 or we can choose a new DME company locally.  He will need a follow-up appointment in about 10 to 12 weeks after set up.  He can be scheduled with [name] as well.   Huston Foley, MD, PhD [facility])

## 2020-06-26 NOTE — Telephone Encounter (Signed)
I called pt. I advised pt that [name] reviewed their sleep study results and found that pt severe osa was present. [name] recommends that pt start autopap for treatment. I reviewed PAP compliance expectations with the pt. Pt is agreeable to starting an auto-PAP. I advised pt that an order will be sent to a DME, [employer], and [employer] will call the pt within about one week after they file with the pt's insurance. [employer] will show the pt how to use the machine, fit for masks, and troubleshoot the auto-PAP if needed. A follow up appt was made for insurance purposes with JM, NP on 09/03/2020 at 315 pm. Pt verbalized understanding to arrive 15 minutes early and bring their auto-PAP. A letter with all of this information in it will be mailed to the pt as a reminder. I verified with the pt that the address we have on file is correct. Pt verbalized understanding of results. Pt had no questions at this time but was encouraged to call back if questions arise. I have sent the order to [employer] and have received confirmation that they have received the order.

## 2020-07-11 ENCOUNTER — Telehealth: Payer: Self-pay | Admitting: *Deleted

## 2020-07-11 NOTE — Telephone Encounter (Signed)
Faxed form with last office notes. Called and let them know spoke to [place].

## 2020-07-11 NOTE — Telephone Encounter (Signed)
Montine Circle dietitian with [employer] left VM at Triage asking about the status of pt's new tube feeding orders she faxed on the 15th, and again on the 21st. She hasn't received anything, she needs the signed orders and the last OV note sent to them  [facility] request call back at [phone]  Their fax # [phone]

## 2020-08-02 ENCOUNTER — Other Ambulatory Visit: Payer: Self-pay | Admitting: Physical Medicine & Rehabilitation

## 2020-08-02 ENCOUNTER — Other Ambulatory Visit: Payer: Self-pay | Admitting: Primary Care

## 2020-08-02 DIAGNOSIS — Z8673 Personal history of transient ischemic attack (TIA), and cerebral infarction without residual deficits: Secondary | ICD-10-CM

## 2020-08-02 DIAGNOSIS — M79606 Pain in leg, unspecified: Secondary | ICD-10-CM

## 2020-08-02 DIAGNOSIS — I69398 Other sequelae of cerebral infarction: Secondary | ICD-10-CM

## 2020-08-02 DIAGNOSIS — E785 Hyperlipidemia, unspecified: Secondary | ICD-10-CM

## 2020-08-02 DIAGNOSIS — M79603 Pain in arm, unspecified: Secondary | ICD-10-CM

## 2020-08-02 DIAGNOSIS — N319 Neuromuscular dysfunction of bladder, unspecified: Secondary | ICD-10-CM

## 2020-08-03 ENCOUNTER — Ambulatory Visit: Payer: BC Managed Care – PPO | Attending: Internal Medicine

## 2020-08-03 DIAGNOSIS — Z23 Encounter for immunization: Secondary | ICD-10-CM

## 2020-08-03 NOTE — Progress Notes (Signed)
   Covid-19 Vaccination Clinic  Name:  Michael Bright    MRN: [id number] DOB: August 25, 1956  08/03/2020  [name] was observed post Covid-19 immunization for 15 minutes without incident. He was provided with Vaccine Information Sheet and instruction to access the V-Safe system.   [name] was instructed to call 911 with any severe reactions post vaccine: Marland Kitchen Difficulty breathing  . Swelling of face and throat  . A fast heartbeat  . A bad rash all over body  . Dizziness and weakness

## 2020-08-15 ENCOUNTER — Encounter: Payer: Self-pay | Admitting: Adult Health

## 2020-08-15 ENCOUNTER — Ambulatory Visit (INDEPENDENT_AMBULATORY_CARE_PROVIDER_SITE_OTHER): Payer: BC Managed Care – PPO | Admitting: Adult Health

## 2020-08-15 VITALS — BP 157/88 | HR 83 | Ht 72.0 in | Wt 263.0 lb

## 2020-08-15 DIAGNOSIS — I10 Essential (primary) hypertension: Secondary | ICD-10-CM | POA: Diagnosis not present

## 2020-08-15 DIAGNOSIS — G464 Cerebellar stroke syndrome: Secondary | ICD-10-CM | POA: Diagnosis not present

## 2020-08-15 DIAGNOSIS — E1142 Type 2 diabetes mellitus with diabetic polyneuropathy: Secondary | ICD-10-CM | POA: Diagnosis not present

## 2020-08-15 DIAGNOSIS — E785 Hyperlipidemia, unspecified: Secondary | ICD-10-CM

## 2020-08-15 DIAGNOSIS — G4733 Obstructive sleep apnea (adult) (pediatric): Secondary | ICD-10-CM

## 2020-08-15 DIAGNOSIS — R1312 Dysphagia, oropharyngeal phase: Secondary | ICD-10-CM

## 2020-08-15 MED ORDER — GABAPENTIN 250 MG/5ML PO SOLN: 500.0000 mg | Freq: Every day | ORAL | 4 refills | Status: DC

## 2020-08-15 NOTE — Patient Instructions (Addendum)
Increase gabapentin to 500mg  nightly for diabetic neuropathy pain  Continue working with speech therapy  Continue nightly use of CPAP for sleep apnea management  Continue aspirin 81 mg daily  and lipitor  for secondary stroke prevention  Continue to follow up with PCP regarding cholesterol, blood pressure and diabetes management  Maintain strict control of hypertension with blood pressure goal below 130/90, diabetes with hemoglobin A1c goal below 7% and cholesterol with LDL cholesterol (bad cholesterol) goal below 70 mg/dL.     Follow up around end of December for sleep apnea follow up     Thank you for coming to see January at [facility]. I hope we have been able to provide you high quality care today.  You may receive a patient satisfaction survey over the next few weeks. We would appreciate your feedback and comments so that we may continue to improve ourselves and the health of our patients.

## 2020-08-15 NOTE — Progress Notes (Signed)
[facility] [address] [place] [id number] [phone]       STROKE FOLLOW UP NOTE  Mr. Michael KilRandolph Bright Date of Birth:  [birth year] Medical Record Number:  [id number]   Reason for Referral: stroke follow up    CHIEF COMPLAINT:  No chief complaint on file.   HPI:  Today, 08/15/2020, [name] returns for stroke follow-up accompanied by his son.  Reports residual deficits of RUE numbness and dysarthria which has been improving and dysphagia with limited oral intake and primary use of G tube.  He was found to have esophageal strictures on EGD after SLP referred him to GI.  These were stretched on 8/16 but has not noticed much benefit.  He plans on undergoing barium swallow study in the near future and continues to follow closely with ENT and speech therapy. Denies new or worsening stroke/TIA symptoms.  Remains on aspirin and atorvastatin for secondary stroke prevention without side effects.  Blood pressure today 157/88.  He does report bilateral lower extremity paresthesias likely diabetic neuropathy interfering with ambulation and sleep.  Currently on gabapentin 125-250mg  nightly without benefit.  Denies any side effects on current dose.  Establish care with [facility] sleep clinic Dr. Frances FurbishAthar in 04/2020 undergoing HST confirming severe OSA and was provided with a new CPAP machine and supplies.  He received his CPAP machine approximately 2 weeks ago and reports using nightly tolerating well without difficulty.  No further concerns at this time.    History provided for reference purposes only Update 04/07/2020 JM: [name] is being seen for stroke follow-up accompanied by his wife.  Residual deficits of RUE altered sensation and dysphagia.  Continues to work with outpatient therapy with ongoing improvement.  Continues to receive primary nutrition via G-tube but is able to eat soft food and drink regular liquids without difficulty.  He continues to experience acid reflux type  symptoms and apparently was not able to initiate pantoprazole as he reports insurance declined coverage despite PCP recently providing refill.  Continues on aspirin 81 mg daily and atorvastatin 40 mg daily.  Blood pressure today 136/78.  He requests establishing care with new sleep provider as he is due for new CPAP machine and does not have current provider.  No further concerns at this time.  Update 01/01/2020 JM: [name] is a 64 year old male who is being seen today, 01/01/2020, for stroke follow-up accompanied by his wife.  He has been stable from a stroke standpoint with residual deficits of right hemiparesis, RUE paresthesias and dysphagia s/p PEG tube. Continues to work with outpatient therapy with ongoing improvement.  Continues to use Rollator walker at all times and denies any recent falls.  Ongoing use of PEG tube but has been advanced to NTL without difficulty.  He does endorse increased acid reflux symptoms over the past month which is also required increased suctioning.  He does report use of tomato juice during therapy.  Continues cyclic tube feeding and recently evaluated nutrition who changed tube feed formulation due to constipation and weight gain -has not yet started new feeding.  Continues on gabapentin 250 mg nightly for RUE pain but endorses ongoing constant warm sensation.  Completed 3 months DAPT and continues on aspirin alone without bleeding or bruising.  Continues on atorvastatin 40 mg daily without myalgias.  Recent A1c 5.7.  Continues to follow PCP for HTN, HLD and DM management.  Blood pressure today 136/76. No further concerns at this time  Initial visit 09/11/2019 JM: [name]  is a 64 year old male who is being seen today for hospital follow-up accompanied by his wife.  He has been doing well since discharge with residual right-sided weakness, imbalance and dysphagia.  He does endorse ongoing improvement with home health therapies and plans on transitioning to outpatient  therapies once completed.  He does endorse occasional sharp stabbing sensation with flexion of his left wrist and will resolve with extension.  Denies radiating pain in the wrist or fingers.  He will also experience intermittent shock type sensations on right upper and lower extremity.  He was started on gabapentin 250 mg at bedtime with little improvement.  He continues to use a rolling walker for ambulation as well as AFO brace and denies any recent falls.  Remains n.p.o. and receives all nutrition, hydration and medications through PEG tube.  He does endorse ongoing difficulty with excretions and does have suction device at home for assistance.  Improvement of neurogenic bladder and has been voiding independently.  PCP recently discontinued citalopram as he did not have residual depression/anxiety symptoms.  Depression/anxiety has been stable at this time.  He has continued on aspirin and Plavix without bleeding or bruising.  Continues on atorvastatin without myalgias.  Recent lipid panel obtained by PCP with improvement of LDL 55.  Blood pressure today satisfactory at 125/95.  Glucose levels greatly improved with recent A1c 7.3.  No further concerns at this time.  Stroke admission 05/30/2019: [name] is a 64 y.o. 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  CTA head/neck negative LVO with diffuse intracranial stenosis moderate supraclinoid bilateral ICAs and mild stenosis left VA origin.  2D echo normal EF without cardiac source of embolus identified.  LDL 135.  A1c 10.6.  Recommended DAPT for 3 months then aspirin alone.  Blood pressure stable during admission without prior history of HTN.  Initiated atorvastatin 40 mg daily.  New diagnosis of DM and recommended  close PCP follow-up.  Other stroke is factors include morbid obesity and EtOH but no prior history of stroke.  Hospital course complicated by hypoxemic respiratory failure secondary to medullary stroke intubated on 8/15 with trach placement on 8/21, possible aspiration pneumonia secondary to medullary stroke and dysphagia secondary to medullary stroke with placement of PEG tube on 9/15.  He was discharged to CIR on 06/27/2019 for ongoing therapy with residual deficits.  During admission to CIR, he had improvement of respiratory status and was decannulated on 9/22.  Difficulty with neurogenic bladder requiring intermittent caths and discharged with recommendation on following up with urology outpatient.  He was discharged home in stable condition with recommendation of home health therapy on 07/17/2019.  He unfortunately was readmitted on 07/20/2019 with septic shock due to Klebsiella pneumoniae bacteremia with UTI and hypoxic respiratory failure bilateral lung infiltrates due to rhinovirus.  Required intubation briefly and treated with broad-spectrum antibiotics eventually narrowed with recommendation of 2-week total antibiotic course.  Hospital course significant for AKI with abnormal LFTs due to shock liver, intermittent hypoxic episode as well as significant oral secretions with dysphonia.  He was discharged back to CIR for ongoing therapy due to debility.  Ongoing deficits of severe pharyngeal dysphagia and mild memory deficits affecting recall but showed improvement of activity tolerance, balance and postural control as well as ability to  compensate for deficits and was discharged home with home health therapy on 08/08/2019.    ROS:   14 system review of systems performed and negative with exception of numbness/tingling and dysphagia  PMH:  Past Medical History:  Diagnosis Date  . Acute CVA (cerebrovascular accident) (HCC) 05/30/2019  . Acute kidney injury (HCC)   . Acute respiratory failure (HCC)   .  Aspiration pneumonia (HCC) 06/14/2019  . 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 Diabetes (HCC)   . 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 (CVA) 08/09/2019  . Pneumonia    aspiration pneumonia  . Sepsis with acute renal failure without septic shock (HCC)   . Septic shock (HCC)   . Stroke (HCC)   . Urinary tract infection   . Urinary tract infection without hematuria     PSH:  Past Surgical History:  Procedure Laterality Date  . CHOLECYSTECTOMY    . IR GASTROSTOMY TUBE MOD SED  07/04/2019    Social History:  Social History   Socioeconomic History  . Marital status: Married    Spouse name: Not on file  . Number of children: Not on file  . Years of education: Not on file  . Highest education level: Not on file  Occupational History  . Not on file  Tobacco Use  . Smoking status: Never Smoker  . Smokeless tobacco: Never Used  Vaping Use  . Vaping Use: Never used  Substance and Sexual Activity  . Alcohol use: Yes  . Drug use: No  . Sexual activity: Yes  Other Topics Concern  . Not on file  Social History Narrative  . Not on file   Social Determinants of Health   Financial Resource Strain:   . Difficulty of Paying Living Expenses: Not on file  Food Insecurity:   . Worried About Programme researcher, broadcasting/film/video in the Last Year: Not on file  . Ran Out of Food in the Last Year: Not on file  Transportation Needs:   . Lack of Transportation (Medical): Not on file  . Lack of Transportation (Non-Medical): Not on file  Physical Activity:   . Days of Exercise per Week: Not on file  . Minutes of Exercise per Session: Not on file  Stress:   . Feeling of Stress : Not on file  Social Connections:   . Frequency of Communication with Friends and Family: Not on file  . Frequency of Social Gatherings with Friends and Family: Not on file  . Attends Religious Services: Not on file  . Active Member of Clubs or Organizations: Not on file  . Attends Banker  Meetings: Not on file  . Marital Status: Not on file  Intimate Partner Violence:   . Fear of Current or Ex-Partner: Not on file  . Emotionally Abused: Not on file  . Physically Abused: Not on file  . Sexually Abused: Not on file    Family History:  Family History  Adopted: Yes    Medications:   Current Outpatient Medications on File Prior to Visit  Medication Sig Dispense Refill  . Accu-Chek FastClix Lancets MISC USE AS DIRECTED UP TO FOUR TIMES DAILY 102 each 5  . ACCU-CHEK GUIDE test strip USE AS DIRECTED UP TO FOUR TIMES DAILY 100 strip 5  . acetaminophen (TYLENOL) 325 MG tablet 325-650 mg by Per G Tube route as needed.    Marland Kitchen aspirin 81 MG chewable tablet 81 mg by Per G Tube route daily.    Marland Kitchen atorvastatin (  LIPITOR) 40 MG tablet TAKE 1 TABLET (40 MG TOTAL) BY MOUTH AT BEDTIME. FOR CHOLESTEROL. 90 tablet 0  . budesonide (PULMICORT) 0.25 MG/2ML nebulizer solution TAKE 2 MLS (0.25 MG TOTAL) BY NEBULIZATION 2 (TWO) TIMES DAILY. 360 mL 1  . chlorhexidine (PERIDEX) 0.12 % solution 15 mLs by Mouth Rinse route 2 (two) times daily. 1893 mL 3  . finasteride (PROSCAR) 5 MG tablet PLACE ONE TABLET IN GI TUBE ONCE DAILY FOR URINE FLOW. 90 tablet 0  . fluticasone (FLONASE) 50 MCG/ACT nasal spray PLACE 1 SPRAY INTO BOTH NOSTRILS 2 (TWO) TIMES DAILY AS NEEDED FOR ALLERGIES OR RHINITIS. 16 mL 2  . glycopyrrolate (ROBINUL) 1 MG tablet Place 2 tablets (2 mg total) into feeding tube 3 (three) times daily. For oral secretions. 540 tablet 1  . guaifenesin (ROBITUSSIN) 100 MG/5ML syrup PLACE 10 MLS (200 MG TOTAL) INTO FEEDING TUBE 4 (FOUR) TIMES DAILY - WITH MEALS AND AT BEDTIME. 948 mL 0  . Insulin Glargine (BASAGLAR KWIKPEN) 100 UNIT/ML Inject 0.08 mLs (8 Units total) into the skin every morning. 5 mL 1  . Insulin Pen Needle (BD PEN NEEDLE MICRO U/F) 32G X 6 MM MISC Use daily with insulin. 100 each 2  . mouth rinse LIQD solution 15 mLs by Mouth Rinse route 5 (five) times daily. 946 mL 1  . Nutritional  Supplements (FEEDING SUPPLEMENT, OSMOLITE 1.5 CAL,) LIQD Place 325 mLs into feeding tube 4 (four) times daily. (Patient taking differently: Place 237 mLs into feeding tube 4 (four) times daily. )  0  . omeprazole (FIRST-OMEPRAZOLE) 2 mg/mL SUSP oral suspension Place 10-20 mLs (20-40 mg total) into feeding tube daily. For heartburn. 300 mL 0  . Sennosides (SENNA) 8.8 MG/5ML LIQD PLACE 5 MLS INTO FEEDING TUBE 2 (TWO) TIMES DAILY AS NEEDED FOR MILD CONSTIPATION. 237 mL 3  . Water For Irrigation, Sterile (FREE WATER) SOLN Place 200 mLs into feeding tube every 8 (eight) hours. Use filtered or bottled water (not distilled water)     No current facility-administered medications on file prior to visit.    Allergies:  No Known Allergies   Physical Exam  Vitals:   08/15/20 1541  BP: (!) 157/88  Pulse: 83  Weight: 263 lb (119.3 kg)  Height: 6' (1.829 m)   Body mass index is 35.67 kg/m. No exam data present  General: Obese very pleasant middle-age African-American male, seated, in no evident distress Head: head normocephalic and atraumatic.   Neck: supple with no carotid or supraclavicular bruits Cardiovascular: regular rate and rhythm, no murmurs Musculoskeletal: no deformity Skin:  no rash/petichiae; PEG tube intact Vascular:  Normal pulses all extremities   Neurologic Exam Mental Status: Awake and fully alert. Mild dysarthria. Oriented to place and time. Recent and remote memory intact. Attention span, concentration and fund of knowledge appropriate. Mood and affect appropriate.  Cranial Nerves: Pupils equal, briskly reactive to light. Extraocular movements full without nystagmus. Visual fields full to confrontation. Hearing intact. Facial sensation intact.  Slight right lower facial weakness Motor: Normal bulk and tone.  Full strength in all tested extremities. Sensory.:  Diminished light touch sensation RUE proximally and decreased sensory BLE distally Coordination: Rapid alternating  movements normal in all extremities. Finger-to-nose and heel-to-shin performed accurately bilaterally. Gait and Station: Arises from chair with mild difficulty. Stance is normal. Gait demonstrates  broad-based gait with normal stride length and balance with use of Rollator walker Reflexes: 1+ and symmetric. Toes downgoing.       ASSESSMENT/PLAN: Michael Bright  is a 64 y.o. 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 Vascular risk factors include severe OSA on CPAP, HTN, HLD, DM, EtOH use and morbid obesity.  Prolonged hospital course due to complications including hypoxic respiratory failure with placement of tracheostomy now removed, PEG tube due to dysphagia post stroke, neurogenic bladder post stroke resolved and aspiration pneumonia.  He returned shortly after CIR discharge due to septic shock requiring additional inpatient rehab stay.       1. Left lateral medullary syndrome:  a. Residual deficits: RUE sensory impairment, dysarthria and dysphagia with use of PEG tube.  Continue to follow with ENT and speech therapy for routine monitoring of dysphagia and evidence of esophageal constrictors recently undergoing stretching.  Plans on repeating barium swallow in the near future.   b. Continue aspirin 81 mg daily  and atorvastatin for secondary stroke prevention.  c. Discussed secondary stroke prevention measures and importance of close PCP follow-up for aggressive stroke risk factor management  2. HTN:  a. BP goal<130/90.  Slightly elevated today but stable at home per patient b. Currently diet controlled  3. HLD:  a. LDL goal<70 b. On atorvastatin 40 mg daily per PCP  4. DMII:  a. A1c goal<6.5.  Recent A1c 5.3. b. On insulin per PCP c. BLE paresthesias with symptoms consistent of diabetic neuropathy -recommend increasing  gabapentin to 500 mg nightly  5. OSA on CPAP:  a. Established care with [facility] sleep clinic [name] on 05/09/2020  b. HST 06/12/2020 confirmed severe obstructive sleep apnea with total AHI 45 and O2 nadir 77% c. Received new CPAP machine and supplies approximately 2 weeks ago.  Advised ongoing nightly use of CPAP for OSA management.  Initial CPAP compliance visit scheduled on 11/18 but due to delay of receiving machine, advised to reschedule for January for initial CPAP compliance visit   Follow-up in January for initial CPAP compliance visit and stroke follow-up   CC:  [facility] provider: Dr. Annamarie Dawley, Keane Scrape, NP    I spent 30 minutes of face-to-face and non-face-to-face time with patient and son.  This included previsit chart review, lab review, study review, order entry, electronic health record documentation, patient education regarding history of stroke, residual deficits, use of CPAP for severe OSA, diabetic neuropathy, importance of managing stroke risk factors and answered all questions to patient and son satisfaction   Ihor Austin, AGNP-BC  [facility] [address] [place], [place] [address]  Phone [phone] Fax [phone] Note: This document was prepared with digital dictation and possible smart phrase technology. Any transcriptional errors that result from this process are unintentional.

## 2020-08-16 ENCOUNTER — Other Ambulatory Visit: Payer: Self-pay | Admitting: Primary Care

## 2020-08-16 DIAGNOSIS — E1169 Type 2 diabetes mellitus with other specified complication: Secondary | ICD-10-CM

## 2020-08-16 NOTE — Progress Notes (Signed)
I agree with the above plan 

## 2020-08-22 ENCOUNTER — Other Ambulatory Visit: Payer: Self-pay | Admitting: Primary Care

## 2020-08-22 DIAGNOSIS — R6889 Other general symptoms and signs: Secondary | ICD-10-CM

## 2020-08-23 ENCOUNTER — Ambulatory Visit: Payer: BC Managed Care – PPO | Admitting: Primary Care

## 2020-08-23 ENCOUNTER — Other Ambulatory Visit: Payer: Self-pay

## 2020-08-23 ENCOUNTER — Ambulatory Visit (INDEPENDENT_AMBULATORY_CARE_PROVIDER_SITE_OTHER): Payer: BC Managed Care – PPO | Admitting: Primary Care

## 2020-08-23 ENCOUNTER — Encounter: Payer: Self-pay | Admitting: Primary Care

## 2020-08-23 VITALS — BP 136/82 | HR 82 | Temp 98.6°F | Ht 72.0 in | Wt 273.0 lb

## 2020-08-23 DIAGNOSIS — I10 Essential (primary) hypertension: Secondary | ICD-10-CM

## 2020-08-23 DIAGNOSIS — E669 Obesity, unspecified: Secondary | ICD-10-CM | POA: Diagnosis not present

## 2020-08-23 DIAGNOSIS — R39198 Other difficulties with micturition: Secondary | ICD-10-CM

## 2020-08-23 DIAGNOSIS — Z1211 Encounter for screening for malignant neoplasm of colon: Secondary | ICD-10-CM | POA: Diagnosis not present

## 2020-08-23 DIAGNOSIS — E785 Hyperlipidemia, unspecified: Secondary | ICD-10-CM

## 2020-08-23 DIAGNOSIS — Z1159 Encounter for screening for other viral diseases: Secondary | ICD-10-CM | POA: Diagnosis not present

## 2020-08-23 DIAGNOSIS — Z9989 Dependence on other enabling machines and devices: Secondary | ICD-10-CM

## 2020-08-23 DIAGNOSIS — G464 Cerebellar stroke syndrome: Secondary | ICD-10-CM

## 2020-08-23 DIAGNOSIS — D638 Anemia in other chronic diseases classified elsewhere: Secondary | ICD-10-CM

## 2020-08-23 DIAGNOSIS — E1169 Type 2 diabetes mellitus with other specified complication: Secondary | ICD-10-CM

## 2020-08-23 DIAGNOSIS — M79606 Pain in leg, unspecified: Secondary | ICD-10-CM

## 2020-08-23 DIAGNOSIS — M79603 Pain in arm, unspecified: Secondary | ICD-10-CM

## 2020-08-23 DIAGNOSIS — N289 Disorder of kidney and ureter, unspecified: Secondary | ICD-10-CM

## 2020-08-23 DIAGNOSIS — R202 Paresthesia of skin: Secondary | ICD-10-CM

## 2020-08-23 DIAGNOSIS — Z8673 Personal history of transient ischemic attack (TIA), and cerebral infarction without residual deficits: Secondary | ICD-10-CM

## 2020-08-23 DIAGNOSIS — I69391 Dysphagia following cerebral infarction: Secondary | ICD-10-CM

## 2020-08-23 DIAGNOSIS — K5900 Constipation, unspecified: Secondary | ICD-10-CM

## 2020-08-23 DIAGNOSIS — R6889 Other general symptoms and signs: Secondary | ICD-10-CM

## 2020-08-23 DIAGNOSIS — G4733 Obstructive sleep apnea (adult) (pediatric): Secondary | ICD-10-CM

## 2020-08-23 LAB — POCT GLYCOSYLATED HEMOGLOBIN (HGB A1C): Hemoglobin A1C: 5.4 % (ref 4.0–5.6)

## 2020-08-23 NOTE — Assessment & Plan Note (Signed)
Very well controlled today with A1c of 5.4.  Goal A1c of less than 6.5.  He is experiencing frequent episodes of hypoglycemia which is concerning.  Stop Basaglar insulin for now. Family will monitor glucose and update in 2 weeks via MyChart.  If glucose readings start to rise we may need to consider a low-dose oral agent that can be crushed and put into his PEG tube.  Follow-up in 6 months.

## 2020-08-23 NOTE — Assessment & Plan Note (Signed)
Above goal in the office today, home readings are at goal.  Continue to monitor.

## 2020-08-23 NOTE — Assessment & Plan Note (Signed)
Repeat CBC pending. 

## 2020-08-23 NOTE — Assessment & Plan Note (Signed)
Chronic and continued despite esophageal dilation. Continue use of PEG tube. Continue glycopyrrolate.

## 2020-08-23 NOTE — Addendum Note (Signed)
Addended by: Alvina Chou on: 08/23/2020 04:55 PM   Modules accepted: Orders

## 2020-08-23 NOTE — Progress Notes (Signed)
Subjective:    Patient ID: Michael Bright, male    DOB: [birth year], 64 y.o.   MRN: [id number]  HPI  This visit occurred during the SARS-CoV-2 public health emergency.  Safety protocols were in place, including screening questions prior to the visit, additional usage of staff PPE, and extensive cleaning of exam room while observing appropriate contact time as indicated for disinfecting solutions.   Michael Bright is a 64 year old male with a medical history of hypertension, sleep apnea on CPAP, CVA, dysphagia with PEG tube secondary to CVA, type 2 diabetes, hyperlipidemia, anemia, constipation who presents today for follow-up.  1) CVA/Dysphagia/Oral Secretions: Following with neurology for whom he saw last week, no changes made except for an increase in nightly gabapentin to 500 mg for continued paresthesias.  He has noted paresthesias to the fingertips bilaterally for the last 6+ months, worse recently.  He notified his neurologist of this who then told him to talk to his PCP.  He does have chronic neck pain, denies radiculopathy down bilateral upper extremities.  Also following with otolaryngology through [facility], underwent upper endoscopy in August 2021 and was found to have esophageal candidiasis and esophageal strictures, underwent esophageal dilation and treatment for candidiasis.  Since dilation he is not noticed much improvement, still very limited to what he can take in orally.  Continues to use PEG tube mostly, but does have a swallowing evaluation scheduled for next week.  2) Chronic constipation: Bowel movements occurring once weekly on average despite daily use of senna.  He has tried MiraLAX in the past which caused GI upset.  He is due for colonoscopy.  3) Type 2 Diabetes:  Current medications include: Basaglar 8 units in the morning.  His is checking his blood glucose 3 times daily and is getting readings of:  AM fasting: 80's Before lunch: low 100's Before dinner: 70's-80's.     He is experiencing episodes of hypoglycemia with blood sugars ranging in the 50s to 60s approximately once weekly on average.   Last A1C: 5.3 in May 2021, 5.4 today Last Eye Exam: Due Last Foot Exam: Due Pneumonia Vaccination: Completed in 2020 ACE/ARB: None, urine microalbumin pending. Statin: Atorvastatin    BP Readings from Last 3 Encounters:  08/23/20 136/82  08/15/20 (!) 157/88  06/19/20 (!) 146/80      Review of Systems  Constitutional: Negative for fever.  HENT: Positive for trouble swallowing.   Respiratory: Negative for shortness of breath.   Cardiovascular: Negative for chest pain.  Gastrointestinal: Positive for constipation. Negative for abdominal pain.  Musculoskeletal: Positive for arthralgias and neck pain.  Neurological: Positive for numbness.       Past Medical History:  Diagnosis Date  . Acute CVA (cerebrovascular accident) (HCC) 05/30/2019  . Acute kidney injury (HCC)   . Acute respiratory failure (HCC)   . Aspiration pneumonia (HCC) 06/14/2019  . 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 Diabetes (HCC)   . 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 (CVA) 08/09/2019  . Pneumonia    aspiration pneumonia  . Sepsis with acute renal failure without septic shock (HCC)   . Septic shock (HCC)   . Stroke (HCC)   . Urinary tract infection   . Urinary tract infection without hematuria      Social History   Socioeconomic History  . Marital status: Married    Spouse name: Not on file  . Number of children: Not on file  . Years of education: Not on file  .  Highest education level: Not on file  Occupational History  . Not on file  Tobacco Use  . Smoking status: Never Smoker  . Smokeless tobacco: Never Used  Vaping Use  . Vaping Use: Never used  Substance and Sexual Activity  . Alcohol use: Yes  . Drug use: No  . Sexual activity: Yes  Other Topics Concern  . Not on file  Social History Narrative  . Not on file   Social  Determinants of Health   Financial Resource Strain:   . Difficulty of Paying Living Expenses: Not on file  Food Insecurity:   . Worried About Programme researcher, broadcasting/film/video in the Last Year: Not on file  . Ran Out of Food in the Last Year: Not on file  Transportation Needs:   . Lack of Transportation (Medical): Not on file  . Lack of Transportation (Non-Medical): Not on file  Physical Activity:   . Days of Exercise per Week: Not on file  . Minutes of Exercise per Session: Not on file  Stress:   . Feeling of Stress : Not on file  Social Connections:   . Frequency of Communication with Friends and Family: Not on file  . Frequency of Social Gatherings with Friends and Family: Not on file  . Attends Religious Services: Not on file  . Active Member of Clubs or Organizations: Not on file  . Attends Banker Meetings: Not on file  . Marital Status: Not on file  Intimate Partner Violence:   . Fear of Current or Ex-Partner: Not on file  . Emotionally Abused: Not on file  . Physically Abused: Not on file  . Sexually Abused: Not on file    Past Surgical History:  Procedure Laterality Date  . CHOLECYSTECTOMY    . IR GASTROSTOMY TUBE MOD SED  07/04/2019    Family History  Adopted: Yes    No Known Allergies  Current Outpatient Medications on File Prior to Visit  Medication Sig Dispense Refill  . Accu-Chek FastClix Lancets MISC USE AS DIRECTED UP TO FOUR TIMES DAILY 102 each 5  . ACCU-CHEK GUIDE test strip USE AS DIRECTED UP TO FOUR TIMES DAILY 100 strip 5  . acetaminophen (TYLENOL) 325 MG tablet 325-650 mg by Per G Tube route as needed.    Marland Kitchen aspirin 81 MG chewable tablet 81 mg by Per G Tube route daily.    Marland Kitchen atorvastatin (LIPITOR) 40 MG tablet TAKE 1 TABLET (40 MG TOTAL) BY MOUTH AT BEDTIME. FOR CHOLESTEROL. 90 tablet 0  . chlorhexidine (PERIDEX) 0.12 % solution 15 mLs by Mouth Rinse route 2 (two) times daily. 1893 mL 3  . finasteride (PROSCAR) 5 MG tablet PLACE ONE TABLET IN GI  TUBE ONCE DAILY FOR URINE FLOW. 90 tablet 0  . fluticasone (FLONASE) 50 MCG/ACT nasal spray PLACE 1 SPRAY INTO BOTH NOSTRILS 2 (TWO) TIMES DAILY AS NEEDED FOR ALLERGIES OR RHINITIS. 16 mL 2  . gabapentin (NEURONTIN) 250 MG/5ML solution Place 10 mLs (500 mg total) into feeding tube at bedtime. For pain. 470 mL 4  . glycopyrrolate (ROBINUL) 1 MG tablet PLACE 2 TABLETS (2 MG TOTAL) INTO FEEDING TUBE 3 (THREE) TIMES DAILY. FOR ORAL SECRETIONS. 540 tablet 1  . guaifenesin (ROBITUSSIN) 100 MG/5ML syrup PLACE 10 MLS (200 MG TOTAL) INTO FEEDING TUBE 4 (FOUR) TIMES DAILY - WITH MEALS AND AT BEDTIME. 948 mL 0  . Insulin Glargine (BASAGLAR KWIKPEN) 100 UNIT/ML Inject 0.08 mLs (8 Units total) into the skin every morning. 5  mL 1  . Insulin Pen Needle (BD PEN NEEDLE MICRO U/F) 32G X 6 MM MISC Use daily with insulin. 100 each 2  . mouth rinse LIQD solution 15 mLs by Mouth Rinse route 5 (five) times daily. 946 mL 1  . Nutritional Supplements (FEEDING SUPPLEMENT, OSMOLITE 1.5 CAL,) LIQD Place 325 mLs into feeding tube 4 (four) times daily. (Patient taking differently: Place 237 mLs into feeding tube 4 (four) times daily. )  0  . Sennosides (SENNA) 8.8 MG/5ML LIQD PLACE 5 MLS INTO FEEDING TUBE 2 (TWO) TIMES DAILY AS NEEDED FOR MILD CONSTIPATION. 237 mL 3  . Water For Irrigation, Sterile (FREE WATER) SOLN Place 200 mLs into feeding tube every 8 (eight) hours. Use filtered or bottled water (not distilled water)     No current facility-administered medications on file prior to visit.    BP 136/82   Pulse 82   Temp 98.6 F (37 C) (Temporal)   Ht 6' (1.829 m)   Wt 273 lb (123.8 kg)   SpO2 97%   BMI 37.03 kg/m    Objective:   Physical Exam Cardiovascular:     Rate and Rhythm: Normal rate and regular rhythm.  Pulmonary:     Effort: Pulmonary effort is normal.     Breath sounds: Normal breath sounds.  Abdominal:     General: Abdomen is flat. Bowel sounds are normal.     Palpations: Abdomen is soft.      Comments: PEG tube intact and appears unremarkable.  Musculoskeletal:     Cervical back: Neck supple.  Skin:    General: Skin is warm and dry.  Psychiatric:        Mood and Affect: Mood normal.            Assessment & Plan:

## 2020-08-23 NOTE — Assessment & Plan Note (Signed)
Compliant to CPAP machine.  

## 2020-08-23 NOTE — Assessment & Plan Note (Signed)
Doing well on finasteride 5 mg daily. PSA pending.

## 2020-08-23 NOTE — Assessment & Plan Note (Signed)
Repeat lipid panel pending.  Continue atorvastatin 40 mg.  

## 2020-08-23 NOTE — Assessment & Plan Note (Signed)
No new symptoms, continue residual symptoms of right-sided weakness and dysphagia.  Following with neurology. Continue gabapentin for neuropathy. Continue diabetes and blood pressure control. Continue aspirin and atorvastatin.

## 2020-08-23 NOTE — Assessment & Plan Note (Signed)
Repeat renal function pending. 

## 2020-08-23 NOTE — Assessment & Plan Note (Signed)
Chronic and continued despite daily use of senna. Increase senna to twice daily.  He will update.

## 2020-08-23 NOTE — Assessment & Plan Note (Signed)
Chronic to feet and fingertips, now progressing in fingertips.  Unclear etiology, could be neuropathy or secondary to chronic neck pain.  Offered plain films of the cervical spine today for which she currently declines.  Check B12 level today. He will update after he tries the increased dose of gabapentin.

## 2020-08-23 NOTE — Assessment & Plan Note (Signed)
Doing well on glycopyrrolate 3 times daily. Continue same.

## 2020-08-23 NOTE — Assessment & Plan Note (Signed)
Neurology recently increase gabapentin to 500 mg at bedtime. He will update.

## 2020-08-23 NOTE — Patient Instructions (Signed)
Stop by the lab prior to leaving today. I will notify you of your results once received.   You will be contacted regarding your referral to GI for the colonoscopy.  Please let us know if you have not been contacted within two weeks.   Increase your Senna to twice daily as discussed.  Stop Basaglar Insulin. Monitor glucose readings and send them to me via My Chart in a few weeks.  Please schedule a follow up appointment in 6 months for diabetes check.   It was a pleasure to see you today!

## 2020-08-24 LAB — LIPID PANEL
Cholesterol: 107 mg/dL (ref <200)
HDL: 45 mg/dL (ref >=40)
LDL Cholesterol (Calc): 49 mg/dL (calc)
Non-HDL Cholesterol (Calc): 62 mg/dL (calc) (ref <130)
Total CHOL/HDL Ratio: 2.4 (calc) (ref <5.0)
Triglycerides: 44 mg/dL (ref <150)

## 2020-08-24 LAB — VITAMIN B12: Vitamin B-12: 583 pg/mL (ref 200–1100)

## 2020-08-24 LAB — CBC
HCT: 37.2 % — ABNORMAL LOW (ref 38.5–50.0)
Hemoglobin: 12.6 g/dL — ABNORMAL LOW (ref 13.2–17.1)
MCH: 30.4 pg (ref 27.0–33.0)
MCHC: 33.9 g/dL (ref 32.0–36.0)
MCV: 89.9 fL (ref 80.0–100.0)
MPV: 11.2 fL (ref 7.5–12.5)
Platelets: 207 10*3/uL (ref 140–400)
RBC: 4.14 10*6/uL — ABNORMAL LOW (ref 4.20–5.80)
RDW: 11.8 % (ref 11.0–15.0)
WBC: 6 10*3/uL (ref 3.8–10.8)

## 2020-08-24 LAB — COMPREHENSIVE METABOLIC PANEL
AG Ratio: 1.2 (calc) (ref 1.0–2.5)
ALT: 13 U/L (ref 9–46)
AST: 14 U/L (ref 10–35)
Albumin: 3.6 g/dL (ref 3.6–5.1)
Alkaline phosphatase (APISO): 65 U/L (ref 35–144)
BUN/Creatinine Ratio: 28 (calc) — ABNORMAL HIGH (ref 6–22)
BUN: 16 mg/dL (ref 7–25)
CO2: 26 mmol/L (ref 20–32)
Calcium: 9.1 mg/dL (ref 8.6–10.3)
Chloride: 101 mmol/L (ref 98–110)
Creat: 0.57 mg/dL — ABNORMAL LOW (ref 0.70–1.25)
Globulin: 3.1 g/dL (calc) (ref 1.9–3.7)
Glucose, Bld: 115 mg/dL — ABNORMAL HIGH (ref 65–99)
Potassium: 4.3 mmol/L (ref 3.5–5.3)
Sodium: 140 mmol/L (ref 135–146)
Total Bilirubin: 0.3 mg/dL (ref 0.2–1.2)
Total Protein: 6.7 g/dL (ref 6.1–8.1)

## 2020-08-24 LAB — MICROALBUMIN / CREATININE URINE RATIO
Creatinine, Urine: 82 mg/dL (ref 20–320)
Microalb Creat Ratio: 16 mcg/mg creat (ref <30)
Microalb, Ur: 1.3 mg/dL

## 2020-08-26 LAB — HEPATITIS C ANTIBODY
Hepatitis C Ab: NONREACTIVE
SIGNAL TO CUT-OFF: 0.02 (ref <1.00)

## 2020-08-28 ENCOUNTER — Encounter: Payer: Self-pay | Admitting: Primary Care

## 2020-09-03 ENCOUNTER — Ambulatory Visit: Payer: Self-pay | Admitting: Adult Health

## 2020-09-08 ENCOUNTER — Other Ambulatory Visit: Payer: Self-pay | Admitting: Primary Care

## 2020-09-09 ENCOUNTER — Telehealth: Payer: Self-pay | Admitting: Primary Care

## 2020-09-09 NOTE — Telephone Encounter (Signed)
Called patient in regards to [facility] paperwork for Michael Bright and needed to know if similar to last paperwork.

## 2020-09-11 NOTE — Telephone Encounter (Signed)
Called in regards to [facility] paperwork for [employer]. If calls back please get Francenia Chimenti or ask if new paperwork brought in is still the same from last time.

## 2020-09-16 NOTE — Telephone Encounter (Signed)
Given ppw to [facility]

## 2020-09-16 NOTE — Telephone Encounter (Signed)
Called patient to inform paperwork done. LVM to call back.  When patient calls back please inform FMLA paperwork is done and has been faxed.   Copy for scan Copy for patient

## 2020-09-16 NOTE — Telephone Encounter (Signed)
Noted, completed and handed to Joellen. 

## 2020-09-16 NOTE — Telephone Encounter (Signed)
Called Brandi again in regards to paperwork. Unable to get ahold of her again. Will fill out according to June paperwork. Filled and placed in providers inbox for review and signature.

## 2020-10-23 ENCOUNTER — Ambulatory Visit (INDEPENDENT_AMBULATORY_CARE_PROVIDER_SITE_OTHER): Payer: BC Managed Care – PPO | Admitting: Adult Health

## 2020-10-23 ENCOUNTER — Other Ambulatory Visit: Payer: Self-pay

## 2020-10-23 ENCOUNTER — Encounter: Payer: Self-pay | Admitting: Adult Health

## 2020-10-23 VITALS — BP 128/73 | HR 97 | Ht 72.0 in | Wt 257.8 lb

## 2020-10-23 DIAGNOSIS — R600 Localized edema: Secondary | ICD-10-CM

## 2020-10-23 DIAGNOSIS — G4733 Obstructive sleep apnea (adult) (pediatric): Secondary | ICD-10-CM

## 2020-10-23 DIAGNOSIS — Z9989 Dependence on other enabling machines and devices: Secondary | ICD-10-CM

## 2020-10-23 NOTE — Patient Instructions (Signed)
Your Plan:  Continue nightly use of your CPAP machine -a new order will be sent to your DME company [employer] for insurance purposes and to continue to obtain supplies  I will look into getting insurance to cover leg compression machine and will keep you updated via MyChart   Follow-up in 6 months or call earlier if needed      Thank you for coming to see [place] at [facility]. I hope we have been able to provide you high quality care today.  You may receive a patient satisfaction survey over the next few weeks. We would appreciate your feedback and comments so that we may continue to improve ourselves and the health of our patients.

## 2020-10-23 NOTE — Progress Notes (Signed)
[facility] [address] [place]. [place] [address] [phone]       OFFICE FOLLOW UP NOTE  [name] Date of Birth:  1955/12/25 Medical Record Number:  [id number]   Reason for visit: Initial CPAP compliance visit    CHIEF COMPLAINT:  Chief Complaint  Patient presents with  . Follow-up    Initial CPAP follow up. Here with his wife, Mliss Sax. He is using his CPAP nightly without any issues. He has been feeling more rested and alert during the day since starting therapy. He has continued taking aspirin 81mg  daily and watching BP/cholesterol.    HPI:  Today, 10/23/2020, Mr. Word returns for initial CPAP compliance visit accompanied by his wife. He was previously seen on 08/15/2020 for stroke follow-up.   He reports doing well on CPAP tolerating without difficulty.  Reports improvement of his sleep as well as increased energy during the day.  He continues to follow with [facility] for any needed supplies or CPAP concerns.   Review of compliance report from 09/22/2020 -10/21/2020 shows 29 out of 30 usage days and 24 days greater than 4 hours for 80% compliance.  Average usage 5 hours and 30 minutes.  Residual AHI 3.2 with min pressure 80 and max pressure 16 with a EPR level 2.  Leaks in the 95th percentile 33.6.  Pressure in the 95th percentile 9.4.     History provided for reference purposes only Update 08/15/2020 JM: Mr. Nipp returns for stroke follow-up accompanied by his son.  Reports residual deficits of RUE numbness and dysarthria which has been improving and dysphagia with limited oral intake and primary use of G tube.  He was found to have esophageal strictures on EGD after SLP referred him to GI.  These were stretched on 8/16 but has not noticed much benefit.  He plans on undergoing barium swallow study in the near future and continues to follow closely with ENT and speech therapy. Denies new or worsening stroke/TIA symptoms.  Remains on aspirin and  atorvastatin for secondary stroke prevention without side effects.  Blood pressure today 157/88.  He does report bilateral lower extremity paresthesias likely diabetic neuropathy interfering with ambulation and sleep.  Currently on gabapentin 125-250mg  nightly without benefit.  Denies any side effects on current dose.  Establish care with [facility] sleep clinic Dr. Rexene Alberts in 04/2020 undergoing HST confirming severe OSA and was provided with a new CPAP machine and supplies.  He received his CPAP machine approximately 2 weeks ago and reports using nightly tolerating well without difficulty.  No further concerns at this time.  Update 04/07/2020 JM: Mr. Garrels is being seen for stroke follow-up accompanied by his wife.  Residual deficits of RUE altered sensation and dysphagia.  Continues to work with outpatient therapy with ongoing improvement.  Continues to receive primary nutrition via G-tube but is able to eat soft food and drink regular liquids without difficulty.  He continues to experience acid reflux type symptoms and apparently was not able to initiate pantoprazole as he reports insurance declined coverage despite PCP recently providing refill.  Continues on aspirin 81 mg daily and atorvastatin 40 mg daily.  Blood pressure today 136/78.  He requests establishing care with new sleep provider as he is due for new CPAP machine and does not have current provider.  No further concerns at this time.  Update 01/01/2020 JM: Mr. Lessley is a 65 year old male who is being seen today, 01/01/2020, for stroke follow-up accompanied by his wife.  He has  been stable from a stroke standpoint with residual deficits of right hemiparesis, RUE paresthesias and dysphagia s/p PEG tube. Continues to work with outpatient therapy with ongoing improvement.  Continues to use Rollator walker at all times and denies any recent falls.  Ongoing use of PEG tube but has been advanced to NTL without difficulty.  He does endorse increased acid reflux  symptoms over the past month which is also required increased suctioning.  He does report use of tomato juice during therapy.  Continues cyclic tube feeding and recently evaluated nutrition who changed tube feed formulation due to constipation and weight gain -has not yet started new feeding.  Continues on gabapentin 250 mg nightly for RUE pain but endorses ongoing constant warm sensation.  Completed 3 months DAPT and continues on aspirin alone without bleeding or bruising.  Continues on atorvastatin 40 mg daily without myalgias.  Recent A1c 5.7.  Continues to follow PCP for HTN, HLD and DM management.  Blood pressure today 136/76. No further concerns at this time  Initial visit 09/11/2019 JM: Mr. Milbourne is a 65 year old male who is being seen today for hospital follow-up accompanied by his wife.  He has been doing well since discharge with residual right-sided weakness, imbalance and dysphagia.  He does endorse ongoing improvement with home health therapies and plans on transitioning to outpatient therapies once completed.  He does endorse occasional sharp stabbing sensation with flexion of his left wrist and will resolve with extension.  Denies radiating pain in the wrist or fingers.  He will also experience intermittent shock type sensations on right upper and lower extremity.  He was started on gabapentin 250 mg at bedtime with little improvement.  He continues to use a rolling walker for ambulation as well as AFO brace and denies any recent falls.  Remains n.p.o. and receives all nutrition, hydration and medications through PEG tube.  He does endorse ongoing difficulty with excretions and does have suction device at home for assistance.  Improvement of neurogenic bladder and has been voiding independently.  PCP recently discontinued citalopram as he did not have residual depression/anxiety symptoms.  Depression/anxiety has been stable at this time.  He has continued on aspirin and Plavix without bleeding or  bruising.  Continues on atorvastatin without myalgias.  Recent lipid panel obtained by PCP with improvement of LDL 55.  Blood pressure today satisfactory at 125/95.  Glucose levels greatly improved with recent A1c 7.3.  No further concerns at this time.  Stroke admission 05/30/2019: Mr. Abishai Viegas is a 65 y.o. male with hx DB who stopped meds 10 yrs ago and morbid obesity presenting to [facility] ED with dysarthria, left facial droop, double vision and gait unsteadiness since Sunday 8/9.  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  CTA head/neck negative LVO with diffuse intracranial stenosis moderate supraclinoid bilateral ICAs and mild stenosis left VA origin.  2D echo normal EF without cardiac source of embolus identified.  LDL 135.  A1c 10.6.  Recommended DAPT for 3 months then aspirin alone.  Blood pressure stable during admission without prior history of HTN.  Initiated atorvastatin 40 mg daily.  New diagnosis of DM and recommended close PCP follow-up.  Other stroke is factors include morbid obesity and EtOH but no prior history of stroke.  Hospital course complicated by hypoxemic respiratory failure secondary to medullary stroke intubated on 8/15 with trach placement on 8/21, possible aspiration pneumonia secondary to medullary stroke and dysphagia secondary to medullary  stroke with placement of PEG tube on 9/15.  He was discharged to CIR on 06/27/2019 for ongoing therapy with residual deficits.  During admission to CIR, he had improvement of respiratory status and was decannulated on 9/22.  Difficulty with neurogenic bladder requiring intermittent caths and discharged with recommendation on following up with urology outpatient.  He was discharged home in stable condition with recommendation of home health therapy on 07/17/2019.  He unfortunately was readmitted on 07/20/2019 with septic shock due to Klebsiella pneumoniae bacteremia with UTI and hypoxic  respiratory failure bilateral lung infiltrates due to rhinovirus.  Required intubation briefly and treated with broad-spectrum antibiotics eventually narrowed with recommendation of 2-week total antibiotic course.  Hospital course significant for AKI with abnormal LFTs due to shock liver, intermittent hypoxic episode as well as significant oral secretions with dysphonia.  He was discharged back to CIR for ongoing therapy due to debility.  Ongoing deficits of severe pharyngeal dysphagia and mild memory deficits affecting recall but showed improvement of activity tolerance, balance and postural control as well as ability to compensate for deficits and was discharged home with home health therapy on 08/08/2019.    ROS:   14 system review of systems performed and negative with exception of those listed in HPI     PMH:  Past Medical History:  Diagnosis Date  . Acute CVA (cerebrovascular accident) (HCC) 05/30/2019  . Acute kidney injury (HCC)   . Acute respiratory failure (HCC)   . Aspiration pneumonia (HCC) 06/14/2019  . 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 Diabetes (HCC)   . 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 (CVA) 08/09/2019  . Pneumonia    aspiration pneumonia  . Sepsis with acute renal failure without septic shock (HCC)   . Septic shock (HCC)   . Stroke (HCC)   . Urinary tract infection   . Urinary tract infection without hematuria     PSH:  Past Surgical History:  Procedure Laterality Date  . CHOLECYSTECTOMY    . IR GASTROSTOMY TUBE MOD SED  07/04/2019    Social History:  Social History   Socioeconomic History  . Marital status: Married    Spouse name: Not on file  . Number of children: Not on file  . Years of education: Not on file  . Highest education level: Not on file  Occupational History  . Not on file  Tobacco Use  . Smoking status: Never Smoker  . Smokeless tobacco: Never Used  Vaping Use  . Vaping Use: Never used  Substance and  Sexual Activity  . Alcohol use: Yes  . Drug use: No  . Sexual activity: Yes  Other Topics Concern  . Not on file  Social History Narrative  . Not on file   Social Determinants of Health   Financial Resource Strain: Not on file  Food Insecurity: Not on file  Transportation Needs: Not on file  Physical Activity: Not on file  Stress: Not on file  Social Connections: Not on file  Intimate Partner Violence: Not on file    Family History:  Family History  Adopted: Yes    Medications:   Current Outpatient Medications on File Prior to Visit  Medication Sig Dispense Refill  . Accu-Chek FastClix Lancets MISC USE AS DIRECTED UP TO FOUR TIMES DAILY 102 each 5  . ACCU-CHEK GUIDE test strip USE AS DIRECTED UP TO FOUR TIMES DAILY 100 strip 5  . acetaminophen (TYLENOL) 325 MG tablet 325-650 mg by Per G Tube route as  needed.    Marland Kitchen aspirin 81 MG chewable tablet 81 mg by Per G Tube route daily.    Marland Kitchen atorvastatin (LIPITOR) 40 MG tablet TAKE 1 TABLET (40 MG TOTAL) BY MOUTH AT BEDTIME. FOR CHOLESTEROL. 90 tablet 0  . chlorhexidine (PERIDEX) 0.12 % solution 15 mLs by Mouth Rinse route 2 (two) times daily. 1893 mL 3  . finasteride (PROSCAR) 5 MG tablet PLACE ONE TABLET IN GI TUBE ONCE DAILY FOR URINE FLOW. 90 tablet 0  . fluticasone (FLONASE) 50 MCG/ACT nasal spray PLACE 1 SPRAY INTO BOTH NOSTRILS 2 (TWO) TIMES DAILY AS NEEDED FOR ALLERGIES OR RHINITIS. 16 mL 2  . gabapentin (NEURONTIN) 250 MG/5ML solution Place 10 mLs (500 mg total) into feeding tube at bedtime. For pain. 470 mL 4  . glycopyrrolate (ROBINUL) 1 MG tablet PLACE 2 TABLETS (2 MG TOTAL) INTO FEEDING TUBE 3 (THREE) TIMES DAILY. FOR ORAL SECRETIONS. 540 tablet 1  . guaifenesin (ROBITUSSIN) 100 MG/5ML syrup PLACE 10 MLS (200 MG TOTAL) INTO FEEDING TUBE 4 (FOUR) TIMES DAILY - WITH MEALS AND AT BEDTIME. 948 mL 0  . Insulin Pen Needle (BD PEN NEEDLE MICRO U/F) 32G X 6 MM MISC Use daily with insulin. 100 each 2  . mouth rinse LIQD solution 15  mLs by Mouth Rinse route 5 (five) times daily. 946 mL 1  . Nutritional Supplements (FEEDING SUPPLEMENT, OSMOLITE 1.5 CAL,) LIQD Place 325 mLs into feeding tube 4 (four) times daily. (Patient taking differently: Place 237 mLs into feeding tube 4 (four) times daily.)  0  . Sennosides (SENNA) 8.8 MG/5ML LIQD PLACE 5 MLS INTO FEEDING TUBE 2 (TWO) TIMES DAILY AS NEEDED FOR MILD CONSTIPATION. 237 mL 3  . Water For Irrigation, Sterile (FREE WATER) SOLN Place 200 mLs into feeding tube every 8 (eight) hours. Use filtered or bottled water (not distilled water)     No current facility-administered medications on file prior to visit.    Allergies:  No Known Allergies   Physical Exam  Vitals:   10/23/20 1514  BP: 128/73  Pulse: 97  Weight: 257 lb 12.8 oz (116.9 kg)  Height: 6' (1.829 m)   Body mass index is 34.96 kg/m. No exam data present  General: Obese very pleasant middle-age African-American male, seated, in no evident distress Head: head normocephalic and atraumatic.   Neck: supple with no carotid or supraclavicular bruits Cardiovascular: regular rate and rhythm, no murmurs; +3 pitting edema BLE Musculoskeletal: no deformity Skin:  no rash/petichiae; PEG tube intact Vascular:  Normal pulses all extremities   Neurologic Exam Mental Status: Awake and fully alert. Mild dysarthria. Oriented to place and time. Recent and remote memory intact. Attention span, concentration and fund of knowledge appropriate. Mood and affect appropriate.  Cranial Nerves: Pupils equal, briskly reactive to light. Extraocular movements full without nystagmus. Visual fields full to confrontation. Hearing intact. Facial sensation intact.  Slight right lower facial weakness Motor: Normal bulk and tone.  Full strength in all tested extremities. Sensory.:  Diminished light touch sensation RUE proximally and decreased sensory BLE distally Coordination: Rapid alternating movements normal in all extremities.  Finger-to-nose and heel-to-shin performed accurately bilaterally. Gait and Station: Arises from chair with mild difficulty. Stance is normal. Gait demonstrates  broad-based gait with normal stride length and balance with use of Rollator walker Reflexes: 1+ and symmetric. Toes downgoing.       ASSESSMENT/PLAN: Ridhaan Dreibelbis is a 65 y.o. 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 Vascular risk factors include severe OSA on CPAP, HTN, HLD, DM, EtOH use and morbid obesity.  Prolonged hospital course due to complications including hypoxic respiratory failure with placement of tracheostomy now removed, PEG tube due to dysphagia post stroke, neurogenic bladder post stroke resolved and aspiration pneumonia.  He returned shortly after CIR discharge due to septic shock requiring additional inpatient rehab stay.       1. OSA on CPAP: a. Initial CPAP compliance visit today shows satisfactory compliance and optimal residual AHI at 3.2. advised continue use of CPAP and discussed importance of continued use. He will continue to follow with DME company [employer] for any needed supplies or CPAP related concerns 2. BLE edema a. Likely dependent type as worsened with increased ambulation or activity despite use of compression stockings which then interferes with gait and balance. Unsure if insurance will assist with leg massager as requested by wife. Will further look into update patient.  Also discussed continued use of compression stockings, elevation of legs and decreasing sodium intake    Follow-up in 6 months or call earlier if needed    CC:  [facility] provider: Dr. Heloise Purpura, Keane Scrape, NP     I spent 30 minutes of face-to-face and non-face-to-face time with patient and wife.  This included previsit chart review, lab review, study review,  order entry, electronic health record documentation, patient education and discussion regarding review of CPAP compliance report and importance of ongoing usage, BLE edema and use of compression device and answered all other questions to patient and wife satisfaction   Ihor Austin, AGNP-BC  [facility] [address] [place], [place] [address]  Phone [phone] Fax [phone] Note: This document was prepared with digital dictation and possible smart phrase technology. Any transcriptional errors that result from this process are unintentional.

## 2020-10-27 ENCOUNTER — Other Ambulatory Visit: Payer: Self-pay | Admitting: Primary Care

## 2020-10-27 DIAGNOSIS — Z8673 Personal history of transient ischemic attack (TIA), and cerebral infarction without residual deficits: Secondary | ICD-10-CM

## 2020-10-27 DIAGNOSIS — I69398 Other sequelae of cerebral infarction: Secondary | ICD-10-CM

## 2020-10-27 DIAGNOSIS — K5900 Constipation, unspecified: Secondary | ICD-10-CM

## 2020-10-27 DIAGNOSIS — E785 Hyperlipidemia, unspecified: Secondary | ICD-10-CM

## 2020-10-27 DIAGNOSIS — N319 Neuromuscular dysfunction of bladder, unspecified: Secondary | ICD-10-CM

## 2020-11-26 LAB — HM DIABETES EYE EXAM

## 2020-11-28 ENCOUNTER — Encounter: Payer: Self-pay | Admitting: Primary Care

## 2020-12-18 ENCOUNTER — Encounter: Payer: Self-pay | Admitting: Physical Medicine & Rehabilitation

## 2020-12-18 ENCOUNTER — Encounter
Payer: BC Managed Care – PPO | Attending: Physical Medicine & Rehabilitation | Admitting: Physical Medicine & Rehabilitation

## 2020-12-18 ENCOUNTER — Other Ambulatory Visit: Payer: Self-pay

## 2020-12-18 VITALS — BP 128/77 | HR 84 | Temp 98.5°F | Ht 72.0 in | Wt 262.2 lb

## 2020-12-18 DIAGNOSIS — N528 Other male erectile dysfunction: Secondary | ICD-10-CM | POA: Diagnosis present

## 2020-12-18 DIAGNOSIS — Z8673 Personal history of transient ischemic attack (TIA), and cerebral infarction without residual deficits: Secondary | ICD-10-CM

## 2020-12-18 DIAGNOSIS — I69391 Dysphagia following cerebral infarction: Secondary | ICD-10-CM | POA: Diagnosis present

## 2020-12-18 DIAGNOSIS — R269 Unspecified abnormalities of gait and mobility: Secondary | ICD-10-CM | POA: Diagnosis present

## 2020-12-18 MED ORDER — SILDENAFIL CITRATE 50 MG PO TABS: 50.0000 mg | ORAL_TABLET | Freq: Every day | ORAL | 1 refills | Status: DC | PRN

## 2020-12-18 NOTE — Progress Notes (Signed)
Subjective:    Patient ID: Michael Bright, male    DOB: [birth year], 65 y.o.   MRN: [id number]  HPI   Michael Bright is here in follow-up of his left thalamic stroke.  He is continued working on his home program for swallowing.  He is on a full liquid/soft diet.  He is eating yogurts and has tried mashed potatoes but the potatoes tend to stick on the back of his throat.  Does seem to be tolerating it fairly well.  He has lost some weight and is off insulin currently.  He still reports numbness in his right arm and leg.  He is having some pain along the right posterior portion of his neck.  He has not tried much in the way for treatment of it just yet.  He is using a rolling walker for ambulation at home.  He exercises fairly regularly.  He walks in the store using a cart for support.  He asked if he could be walking on his own without a device.  Bowel bladder function are stable.  He is experiencing some erectile dysfunction and had questions about whether he could be on medication for this.  Pain Inventory Average Pain 5 Pain Right Now 5 My pain is intermittent and cold type pain.  LOCATION OF PAIN behind right ear down to right hand.  BOWEL Number of stools per week: 7 Oral laxative use Yes  Type of laxative Senna Enema or suppository use No  History of colostomy No  Incontinent No   BLADDER Normal In and out cath, frequency N/A Able to self cath No  Bladder incontinence No  Frequent urination No  Leakage with coughing No  Difficulty starting stream No  Incomplete bladder emptying No    Mobility use a cane use a walker how many minutes can you walk? 30 MINS ability to climb steps?  yes do you drive?  no Do you have any goals in this area?  yes  Function disabled: date disabled 69 Years  Neuro/Psych weakness trouble walking  Prior Studies Any changes since last visit?  no  Physicians involved in your care Any changes since last visit?  no   Family History   Adopted: Yes   Social History   Socioeconomic History  . Marital status: Married    Spouse name: Not on file  . Number of children: Not on file  . Years of education: Not on file  . Highest education level: Not on file  Occupational History  . Not on file  Tobacco Use  . Smoking status: Never Smoker  . Smokeless tobacco: Never Used  Vaping Use  . Vaping Use: Never used  Substance and Sexual Activity  . Alcohol use: Yes  . Drug use: No  . Sexual activity: Yes  Other Topics Concern  . Not on file  Social History Narrative  . Not on file   Social Determinants of Health   Financial Resource Strain: Not on file  Food Insecurity: Not on file  Transportation Needs: Not on file  Physical Activity: Not on file  Stress: Not on file  Social Connections: Not on file   Past Surgical History:  Procedure Laterality Date  . CHOLECYSTECTOMY    . IR GASTROSTOMY TUBE MOD SED  07/04/2019   Past Medical History:  Diagnosis Date  . Acute CVA (cerebrovascular accident) (HCC) 05/30/2019  . Acute kidney injury (HCC)   . Acute respiratory failure (HCC)   . Aspiration pneumonia (HCC) 06/14/2019  .  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 Diabetes (HCC)   . 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 (CVA) 08/09/2019  . Pneumonia    aspiration pneumonia  . Sepsis with acute renal failure without septic shock (HCC)   . Septic shock (HCC)   . Stroke (HCC)   . Urinary tract infection   . Urinary tract infection without hematuria    BP 128/77   Pulse 84   Temp 98.5 F (36.9 C)   Ht 6' (1.829 m)   Wt 262 lb 3.2 oz (118.9 kg)   SpO2 98%   BMI 35.56 kg/m   Opioid Risk Score:   Fall Risk Score:  `1  Depression screen PHQ 2/9  Depression screen [facility] 2/9 06/19/2020 09/12/2019 08/22/2019  Decreased Interest 0 0 0  Down, Depressed, Hopeless 0 0 0  PHQ - 2 Score 0 0 0  Altered sleeping - 0 0  Tired, decreased energy - 3 3  Change in appetite - 0 0  Feeling bad or  failure about yourself  - 0 0  Trouble concentrating - 0 0  Moving slowly or fidgety/restless - 0 0  Suicidal thoughts - 0 0  PHQ-9 Score - 3 3  Difficult doing work/chores - - Not difficult at all  Some recent data might be hidden   Review of Systems  Cardiovascular: Positive for leg swelling.       Top of feet swelling  Musculoskeletal: Positive for back pain and gait problem.  All other systems reviewed and are negative.      Objective:   Physical Exam  General: No acute distress HEENT: EOMI, oral membranes moist Cards: reg rate  Chest: normal effort Abdomen: Soft, NT, ND Skin: dry, intact Extremities: tr LE edema  Neuro: SPEECH dysartrhic but intelligible. Right sided sensory loss present arm/leg. . strength nearly 5/5 in all 4's. ambulates nicely with RW and did surprisingly well without device.  Psych: Pt's affect is appropriate. Pt is cooperative Musculoskeletal: left 4th/3rd finger contractures still present. Head forward position. Right trap tight with palpation, worse with flexion than extension.       Assessment & Plan:  1.Hx of left lateral medullary infarct 2020 with balance deficits and severe oropharyngeal dysphagia         -urged him to use rolling walker for ambulation.    -may walk without walker when he's with someone                 -full liquid diet essentially.  2 DM: under control 3. OSA on CPAP: PCP Following.  3 Pain: maintain gabapentin for neuropathic pain.          -his leg symptoms are related to his stroke and likely diabetes        -discussed posture, mechanics in regard to his cervical posture  -heating pad.  4. Fatigue             -optimize sleep             -using CPAP              5.  Mild Dupuytren's contractures of the left third and fourth fingers             - improved 6. Erectile dysfunction  -gave him rx for 5 viagra    15 Minutes of face to face patient care time were spent during this visit. All questions were encouraged  and answered.  Follow up with me in 6  mos .

## 2020-12-18 NOTE — Patient Instructions (Signed)
PLEASE FEEL FREE TO CALL OUR OFFICE WITH ANY PROBLEMS OR QUESTIONS [phone])                   USE YOUR WALKER WHEN YOUR AMBULATING ALONE.   REMEMBER TO USE GOOD POSTURE WHEN YOU SIT AND STAND. (HEAD  AND SHOULDERS BACK!)  HEATING PAD FOR YOUR NECK TO HELP WITH PAIN/SPASM

## 2021-01-15 ENCOUNTER — Other Ambulatory Visit: Payer: Self-pay | Admitting: Physical Medicine & Rehabilitation

## 2021-02-16 ENCOUNTER — Other Ambulatory Visit: Payer: Self-pay | Admitting: Primary Care

## 2021-02-16 DIAGNOSIS — R6889 Other general symptoms and signs: Secondary | ICD-10-CM

## 2021-02-21 ENCOUNTER — Ambulatory Visit: Payer: BC Managed Care – PPO | Admitting: Primary Care

## 2021-03-02 ENCOUNTER — Other Ambulatory Visit: Payer: Self-pay | Admitting: Primary Care

## 2021-03-02 DIAGNOSIS — E1169 Type 2 diabetes mellitus with other specified complication: Secondary | ICD-10-CM

## 2021-03-04 ENCOUNTER — Ambulatory Visit (INDEPENDENT_AMBULATORY_CARE_PROVIDER_SITE_OTHER): Payer: BC Managed Care – PPO | Admitting: Primary Care

## 2021-03-04 ENCOUNTER — Other Ambulatory Visit: Payer: Self-pay

## 2021-03-04 ENCOUNTER — Encounter: Payer: Self-pay | Admitting: Primary Care

## 2021-03-04 VITALS — BP 128/78 | HR 78 | Temp 98.7°F | Ht 72.0 in | Wt 272.0 lb

## 2021-03-04 DIAGNOSIS — Z1211 Encounter for screening for malignant neoplasm of colon: Secondary | ICD-10-CM

## 2021-03-04 DIAGNOSIS — M79603 Pain in arm, unspecified: Secondary | ICD-10-CM

## 2021-03-04 DIAGNOSIS — R6889 Other general symptoms and signs: Secondary | ICD-10-CM

## 2021-03-04 DIAGNOSIS — R39198 Other difficulties with micturition: Secondary | ICD-10-CM

## 2021-03-04 DIAGNOSIS — K5900 Constipation, unspecified: Secondary | ICD-10-CM | POA: Diagnosis not present

## 2021-03-04 DIAGNOSIS — Z8673 Personal history of transient ischemic attack (TIA), and cerebral infarction without residual deficits: Secondary | ICD-10-CM

## 2021-03-04 DIAGNOSIS — I10 Essential (primary) hypertension: Secondary | ICD-10-CM | POA: Diagnosis not present

## 2021-03-04 DIAGNOSIS — E1169 Type 2 diabetes mellitus with other specified complication: Secondary | ICD-10-CM

## 2021-03-04 DIAGNOSIS — E785 Hyperlipidemia, unspecified: Secondary | ICD-10-CM

## 2021-03-04 DIAGNOSIS — E669 Obesity, unspecified: Secondary | ICD-10-CM

## 2021-03-04 DIAGNOSIS — Z9989 Dependence on other enabling machines and devices: Secondary | ICD-10-CM

## 2021-03-04 DIAGNOSIS — M79606 Pain in leg, unspecified: Secondary | ICD-10-CM

## 2021-03-04 DIAGNOSIS — I69391 Dysphagia following cerebral infarction: Secondary | ICD-10-CM

## 2021-03-04 DIAGNOSIS — R600 Localized edema: Secondary | ICD-10-CM

## 2021-03-04 DIAGNOSIS — G4733 Obstructive sleep apnea (adult) (pediatric): Secondary | ICD-10-CM

## 2021-03-04 LAB — POCT GLYCOSYLATED HEMOGLOBIN (HGB A1C): Hemoglobin A1C: 5.4 % (ref 4.0–5.6)

## 2021-03-04 MED ORDER — SENNA 8.8 MG/5ML PO LIQD: 5.0000 mL | Freq: Two times a day (BID) | ORAL | 1 refills | Status: DC | PRN

## 2021-03-04 NOTE — Assessment & Plan Note (Signed)
No new symptoms, appears stable today.  Continue BP control, diabetes control, atorvastatin, aspirin

## 2021-03-04 NOTE — Assessment & Plan Note (Signed)
Well controlled in the office today, continue off medications.  

## 2021-03-04 NOTE — Assessment & Plan Note (Signed)
Labs from November 2021 UTD, continue atorvastatin 40 mg daily.

## 2021-03-04 NOTE — Assessment & Plan Note (Signed)
Refills provided for Senna to use PRN. Continue same.

## 2021-03-04 NOTE — Assessment & Plan Note (Signed)
Doing well on finasteride 5 mg daily, continue same.

## 2021-03-04 NOTE — Assessment & Plan Note (Signed)
Chronic and continued. Continue glycopyrrolate 2 mg TID.

## 2021-03-04 NOTE — Assessment & Plan Note (Signed)
Compliant to CPAP machine nightly, follows with neurology, recent visit reviewed.

## 2021-03-04 NOTE — Assessment & Plan Note (Signed)
Ongoing, can swallow more foods than previously. No plans on repeat esophageal dilation. No longer following with speech therapy.  Continue some orals with regular PEG tube use.

## 2021-03-04 NOTE — Assessment & Plan Note (Signed)
Ongoing, noted today. Encouraged compression socks, elevation of extremities, ambulation daily.

## 2021-03-04 NOTE — Assessment & Plan Note (Signed)
Well controlled today with A1C of 5.4. Continue off diabetes medications.  Will refer to podiatry for foot care.

## 2021-03-04 NOTE — Progress Notes (Signed)
Subjective:    Patient ID: Michael Bright, male    DOB: 07/31/1956, 65 y.o.   MRN: [id number]  HPI  Michael Bright is a very pleasant 65 y.o. male with a history of hypertension, OSA, type 2 diabetes, renal insufficiency, CVA, PEG tub in place, paresthesias, GERD who presents today for follow up of chronic conditions.  Currently following with physical medicine, last visit being March 2022. He is using his walker everyday, no recent falls. He was prescribed sildenafil for erectile dysfunction. He is managed on gabapentin 500 mg HS which helps at night for pain but causes drowsiness during the day.   Managed on glycopyrrolate 2 mg into feeding tube three times daily for coupoius secretions. Previously following with otolaryngology through [facility], last visit being in November 2021. Overall he doesn't notice a large improvement in swallowing, continues to notice ongoing secretions. He's able to eat chips and dip, yogurt, milk, ice cream, pasta with sauce. Underwent esophageal dilation in Fall 2021, no further dilation was recommended.  Managed on finasteride 5 mg for urine flow and is doing well.  He is moving his bowels when taking Senna or ducolax. He is needing refills today.  Following with neurology for OSA, compliant to CPAP. No changes during visit in January 2022.  He's noticed bilateral lower extremity edema. He does sit on his walker mostly throughout the day with his legs down. He is getting up some during the day to ambulate, also uses a stationary bike several times weekly. His wife has noticed dry skin.   BP Readings from Last 3 Encounters:  03/04/21 128/78  12/18/20 128/77  10/23/20 128/73      Review of Systems  HENT:       Chronic oral secretions  Respiratory: Negative for shortness of breath.   Gastrointestinal: Negative for abdominal pain.  Musculoskeletal: Positive for arthralgias.  Skin:       Dry skin to lower extremities.   Neurological: Negative for  headaches.         Past Medical History:  Diagnosis Date  . Acute CVA (cerebrovascular accident) (HCC) 05/30/2019  . Acute kidney injury (HCC)   . Acute respiratory failure (HCC)   . Aspiration pneumonia (HCC) 06/14/2019  . 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 Diabetes (HCC)   . 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 (CVA) 08/09/2019  . Pneumonia    aspiration pneumonia  . Sepsis with acute renal failure without septic shock (HCC)   . Septic shock (HCC)   . Stroke (HCC)   . Urinary tract infection   . Urinary tract infection without hematuria     Social History   Socioeconomic History  . Marital status: Married    Spouse name: Not on file  . Number of children: Not on file  . Years of education: Not on file  . Highest education level: Not on file  Occupational History  . Not on file  Tobacco Use  . Smoking status: Never Smoker  . Smokeless tobacco: Never Used  Vaping Use  . Vaping Use: Never used  Substance and Sexual Activity  . Alcohol use: Yes  . Drug use: No  . Sexual activity: Yes  Other Topics Concern  . Not on file  Social History Narrative  . Not on file   Social Determinants of Health   Financial Resource Strain: Not on file  Food Insecurity: Not on file  Transportation Needs: Not on file  Physical Activity: Not on file  Stress:  Not on file  Social Connections: Not on file  Intimate Partner Violence: Not on file    Past Surgical History:  Procedure Laterality Date  . CHOLECYSTECTOMY    . IR GASTROSTOMY TUBE MOD SED  07/04/2019    Family History  Adopted: Yes    No Known Allergies  Current Outpatient Medications on File Prior to Visit  Medication Sig Dispense Refill  . Accu-Chek FastClix Lancets MISC USE AS DIRECTED UP TO FOUR TIMES DAILY 102 each 5  . ACCU-CHEK GUIDE test strip USE AS DIRECTED UP TO FOUR TIMES DAILY 100 strip 5  . acetaminophen (TYLENOL) 325 MG tablet 325-650 mg by Per G Tube route as needed.     Marland Kitchen aspirin 81 MG chewable tablet 81 mg by Per G Tube route daily.    Marland Kitchen atorvastatin (LIPITOR) 40 MG tablet TAKE 1 TABLET (40 MG TOTAL) BY MOUTH AT BEDTIME. FOR CHOLESTEROL. 90 tablet 1  . chlorhexidine (PERIDEX) 0.12 % solution RINSE AND SPIT WITH 15 MLS BY MOUTH 2 (TWO) TIMES DAILY. 473 mL 1  . finasteride (PROSCAR) 5 MG tablet PLACE ONE TABLET IN GI TUBE ONCE DAILY FOR URINE FLOW. 90 tablet 1  . fluticasone (FLONASE) 50 MCG/ACT nasal spray PLACE 1 SPRAY INTO BOTH NOSTRILS 2 (TWO) TIMES DAILY AS NEEDED FOR ALLERGIES OR RHINITIS. 16 mL 2  . gabapentin (NEURONTIN) 250 MG/5ML solution Place 10 mLs (500 mg total) into feeding tube at bedtime. For pain. 470 mL 4  . glycopyrrolate (ROBINUL) 1 MG tablet PLACE 2 TABLETS (2 MG TOTAL) INTO FEEDING TUBE 3 (THREE) TIMES DAILY. FOR ORAL SECRETIONS. 540 tablet 0  . guaifenesin (ROBITUSSIN) 100 MG/5ML syrup PLACE 10 MLS (200 MG TOTAL) INTO FEEDING TUBE 4 (FOUR) TIMES DAILY - WITH MEALS AND AT BEDTIME. 948 mL 0  . mouth rinse LIQD solution 15 mLs by Mouth Rinse route 5 (five) times daily. 946 mL 1  . Nutritional Supplements (FEEDING SUPPLEMENT, OSMOLITE 1.5 CAL,) LIQD Place 325 mLs into feeding tube 4 (four) times daily. (Patient taking differently: Place 237 mLs into feeding tube 4 (four) times daily.)  0  . Sennosides (SENNA) 8.8 MG/5ML LIQD PLACE 5 MLS INTO FEEDING TUBE 2 (TWO) TIMES DAILY AS NEEDED FOR MILD CONSTIPATION. 300 mL 2  . sildenafil (VIAGRA) 50 MG tablet Take 1 tablet (50 mg total) by mouth daily as needed for erectile dysfunction. 5 tablet 1  . Water For Irrigation, Sterile (FREE WATER) SOLN Place 200 mLs into feeding tube every 8 (eight) hours. Use filtered or bottled water (not distilled water)     No current facility-administered medications on file prior to visit.    BP 128/78   Pulse 78   Temp 98.7 F (37.1 C) (Temporal)   Ht 6' (1.829 m)   Wt 272 lb (123.4 kg)   SpO2 97%   BMI 36.89 kg/m  Objective:   Physical Exam Cardiovascular:      Rate and Rhythm: Normal rate and regular rhythm.  Pulmonary:     Effort: Pulmonary effort is normal.     Breath sounds: Normal breath sounds. No wheezing or rales.  Musculoskeletal:     Cervical back: Neck supple.  Skin:    General: Skin is warm and dry.     Comments: Dry skin noted to bilateral lower extremities. Elongated toenails bilaterally   Neurological:     Mental Status: He is alert and oriented to person, place, and time.           Assessment &  Plan:      This visit occurred during the SARS-CoV-2 public health emergency.  Safety protocols were in place, including screening questions prior to the visit, additional usage of staff PPE, and extensive cleaning of exam room while observing appropriate contact time as indicated for disinfecting solutions.

## 2021-03-04 NOTE — Assessment & Plan Note (Signed)
Doing well on gabapentin 500 mg HS but causes drowsiness during the day.   Discussed that he can try a lower dose of gabapentin such as 100 or 200 mg during the day for pain, this would be 2.5 ml or 5 ml daily. Will update patient.

## 2021-03-04 NOTE — Patient Instructions (Addendum)
You will be contacted regarding your referral to GI for the colonoscopy and podiatry for your foot care.    [facility]  Located in: [facility] [address] Address: [address] [place], [place], [place] [address] Hours:  Phone: [phone]  Please schedule a physical appointment for 6 months.   It was a pleasure to see you today!

## 2021-03-10 ENCOUNTER — Telehealth: Payer: Self-pay

## 2021-03-10 NOTE — Telephone Encounter (Signed)
Mrs. Roback, patient's wife, called stating they missed a call from our office. She wonders if it was about patient's FMLA paperwork been ready for pick up. I did not see any notes in the chart. Please call Mrs Ku back with update. Thank you. CB [phone]

## 2021-03-25 ENCOUNTER — Ambulatory Visit (INDEPENDENT_AMBULATORY_CARE_PROVIDER_SITE_OTHER): Payer: BC Managed Care – PPO | Admitting: Podiatry

## 2021-03-25 ENCOUNTER — Other Ambulatory Visit: Payer: Self-pay

## 2021-03-25 ENCOUNTER — Encounter: Payer: Self-pay | Admitting: Podiatry

## 2021-03-25 DIAGNOSIS — E669 Obesity, unspecified: Secondary | ICD-10-CM

## 2021-03-25 DIAGNOSIS — N289 Disorder of kidney and ureter, unspecified: Secondary | ICD-10-CM | POA: Diagnosis not present

## 2021-03-25 DIAGNOSIS — B351 Tinea unguium: Secondary | ICD-10-CM

## 2021-03-25 DIAGNOSIS — M79674 Pain in right toe(s): Secondary | ICD-10-CM

## 2021-03-25 DIAGNOSIS — E1169 Type 2 diabetes mellitus with other specified complication: Secondary | ICD-10-CM | POA: Diagnosis not present

## 2021-03-25 DIAGNOSIS — M79675 Pain in left toe(s): Secondary | ICD-10-CM | POA: Diagnosis not present

## 2021-03-25 NOTE — Progress Notes (Signed)
This patient presents  to my office for at risk foot care.  This patient requires this care by a professional since this patient will be at risk due to having diabetes,CVA and renal insufficiency.  This patient is unable to cut nails himself since the patient cannot reach his nails.These nails are painful walking and wearing shoes. He presents to the office with caregiver. This patient presents for at risk foot care today.  General Appearance  Alert, conversant and in no acute stress.  Vascular  Dorsalis pedis are palpable  Bilaterally. Posterior tibial pulses are absent due to swelling  Bilateral.  Capillary return is within normal limits  bilaterally. Temperature is within normal limits  bilaterally.  Neurologic  Senn-Weinstein monofilament wire test diminished bilaterally. Muscle power within normal limits bilaterally.  Nails Thick disfigured discolored nails with subungual debris  from hallux to fifth toes bilaterally. No evidence of bacterial infection or drainage bilaterally.  Orthopedic  No limitations of motion  feet .  No crepitus or effusions noted.  No bony pathology or digital deformities noted.  Skin  normotropic skin with no porokeratosis noted bilaterally.  No signs of infections or ulcers noted.     Onychomycosis  Pain in right toes  Pain in left toes  Consent was obtained for treatment procedures.   Mechanical debridement of nails 1-5  bilaterally performed with a nail nipper.  Filed with dremel without incident.    Return office visit   3 months                  Told patient to return for periodic foot care and evaluation due to potential at risk complications.   Helane Gunther DPM

## 2021-04-21 ENCOUNTER — Other Ambulatory Visit: Payer: Self-pay | Admitting: Physical Medicine & Rehabilitation

## 2021-04-21 ENCOUNTER — Other Ambulatory Visit: Payer: Self-pay | Admitting: Primary Care

## 2021-04-21 DIAGNOSIS — E1142 Type 2 diabetes mellitus with diabetic polyneuropathy: Secondary | ICD-10-CM

## 2021-04-21 DIAGNOSIS — I69398 Other sequelae of cerebral infarction: Secondary | ICD-10-CM

## 2021-04-21 DIAGNOSIS — Z8673 Personal history of transient ischemic attack (TIA), and cerebral infarction without residual deficits: Secondary | ICD-10-CM

## 2021-04-23 ENCOUNTER — Encounter: Payer: Self-pay | Admitting: *Deleted

## 2021-04-23 ENCOUNTER — Other Ambulatory Visit: Payer: Self-pay | Admitting: Primary Care

## 2021-04-23 DIAGNOSIS — E785 Hyperlipidemia, unspecified: Secondary | ICD-10-CM

## 2021-04-24 ENCOUNTER — Ambulatory Visit (INDEPENDENT_AMBULATORY_CARE_PROVIDER_SITE_OTHER): Payer: BC Managed Care – PPO | Admitting: Adult Health

## 2021-04-24 ENCOUNTER — Encounter: Payer: Self-pay | Admitting: Adult Health

## 2021-04-24 VITALS — BP 120/69 | HR 81 | Ht 72.0 in | Wt 273.0 lb

## 2021-04-24 DIAGNOSIS — G464 Cerebellar stroke syndrome: Secondary | ICD-10-CM

## 2021-04-24 DIAGNOSIS — G4733 Obstructive sleep apnea (adult) (pediatric): Secondary | ICD-10-CM | POA: Diagnosis not present

## 2021-04-24 DIAGNOSIS — Z9989 Dependence on other enabling machines and devices: Secondary | ICD-10-CM | POA: Diagnosis not present

## 2021-04-24 NOTE — Progress Notes (Addendum)
[facility] [address] [place]. [place] [address] [phone]       OFFICE FOLLOW UP NOTE  [name] Date of Birth:  May 07, 1956 Medical Record Number:  [id number]    Reason for visit: Stroke and CPAP follow-up    CHIEF COMPLAINT:  Chief Complaint  Patient presents with   Obstructive Sleep Apnea    RM 3 with spouse bernadette Pt is well and stable, no concerns with CPAP     HPI:  Today, 04/24/2021, [name] returns for 17-month stroke and CPAP follow-up accompanied by his wife  Stable from stroke standpoint without new stroke/TIA symptoms.  Residual dysphagia, dysarthria and RUE numbness stable.  Currently on full liquid diet and use of PEG tube for main nutrition and meds.  Continues to use rolling walker for ambulation-denies any recent falls.  Compliant on aspirin and atorvastatin without associated side effects.  Blood pressure today 120/69.  Recent A1c 5.4.  Continues to do well on CPAP.  He did recently receive a new mask but was sent to a size medium when he usually wears a large and has been having difficulty with leaks since then - they have no attempted to reach out to DME company yet to notify them.  He has no other CPAP related concerns.  Epworth Sleepiness Scale 6         History provided for reference purposes only Update 10/23/2020 JM: [name] returns for initial CPAP compliance visit accompanied by his wife. He was previously seen on 08/15/2020 for stroke follow-up.   He reports doing well on CPAP tolerating without difficulty.  Reports improvement of his sleep as well as increased energy during the day.  He continues to follow with [facility] for any needed supplies or CPAP concerns.   Review of compliance report from 09/22/2020 -10/21/2020 shows 29 out of 30 usage days and 24 days greater than 4 hours for 80% compliance.  Average usage 5 hours and 30 minutes.  Residual AHI 3.2 with min pressure 80 and max pressure 16 with a EPR  level 2.  Leaks in the 95th percentile 33.6.  Pressure in the 95th percentile 9.4.   Update 08/15/2020 JM: [name] returns for stroke follow-up accompanied by his son.  Reports residual deficits of RUE numbness and dysarthria which has been improving and dysphagia with limited oral intake and primary use of G tube.  He was found to have esophageal strictures on EGD after SLP referred him to GI.  These were stretched on 8/16 but has not noticed much benefit.  He plans on undergoing barium swallow study in the near future and continues to follow closely with ENT and speech therapy. Denies new or worsening stroke/TIA symptoms.  Remains on aspirin and atorvastatin for secondary stroke prevention without side effects.  Blood pressure today 157/88.  He does report bilateral lower extremity paresthesias likely diabetic neuropathy interfering with ambulation and sleep.  Currently on gabapentin 125-250mg  nightly without benefit.  Denies any side effects on current dose.  Establish care with [facility] sleep clinic [name] in 04/2020 undergoing HST confirming severe OSA and was provided with a new CPAP machine and supplies.  He received his CPAP machine approximately 2 weeks ago and reports using nightly tolerating well without difficulty.  No further concerns at this time.  Update 04/07/2020 JM: [name] is being seen for stroke follow-up accompanied by his wife.  Residual deficits of RUE altered sensation and dysphagia.  Continues to work with outpatient  therapy with ongoing improvement.  Continues to receive primary nutrition via G-tube but is able to eat soft food and drink regular liquids without difficulty.  He continues to experience acid reflux type symptoms and apparently was not able to initiate pantoprazole as he reports insurance declined coverage despite PCP recently providing refill.  Continues on aspirin 81 mg daily and atorvastatin 40 mg daily.  Blood pressure today 136/78.  He requests establishing care with  new sleep provider as he is due for new CPAP machine and does not have current provider.  No further concerns at this time.  Update 01/01/2020 JM: [name] is a 65 year old male who is being seen today, 01/01/2020, for stroke follow-up accompanied by his wife.  He has been stable from a stroke standpoint with residual deficits of right hemiparesis, RUE paresthesias and dysphagia s/p PEG tube. Continues to work with outpatient therapy with ongoing improvement.  Continues to use Rollator walker at all times and denies any recent falls.  Ongoing use of PEG tube but has been advanced to NTL without difficulty.  He does endorse increased acid reflux symptoms over the past month which is also required increased suctioning.  He does report use of tomato juice during therapy.  Continues cyclic tube feeding and recently evaluated nutrition who changed tube feed formulation due to constipation and weight gain -has not yet started new feeding.  Continues on gabapentin 250 mg nightly for RUE pain but endorses ongoing constant warm sensation.  Completed 3 months DAPT and continues on aspirin alone without bleeding or bruising.  Continues on atorvastatin 40 mg daily without myalgias.  Recent A1c 5.7.  Continues to follow PCP for HTN, HLD and DM management.  Blood pressure today 136/76. No further concerns at this time  Initial visit 09/11/2019 JM: [name] is a 65 year old male who is being seen today for hospital follow-up accompanied by his wife.  He has been doing well since discharge with residual right-sided weakness, imbalance and dysphagia.  He does endorse ongoing improvement with home health therapies and plans on transitioning to outpatient therapies once completed.  He does endorse occasional sharp stabbing sensation with flexion of his left wrist and will resolve with extension.  Denies radiating pain in the wrist or fingers.  He will also experience intermittent shock type sensations on right upper and lower  extremity.  He was started on gabapentin 250 mg at bedtime with little improvement.  He continues to use a rolling walker for ambulation as well as AFO brace and denies any recent falls.  Remains n.p.o. and receives all nutrition, hydration and medications through PEG tube.  He does endorse ongoing difficulty with excretions and does have suction device at home for assistance.  Improvement of neurogenic bladder and has been voiding independently.  PCP recently discontinued citalopram as he did not have residual depression/anxiety symptoms.  Depression/anxiety has been stable at this time.  He has continued on aspirin and Plavix without bleeding or bruising.  Continues on atorvastatin without myalgias.  Recent lipid panel obtained by PCP with improvement of LDL 55.  Blood pressure today satisfactory at 125/95.  Glucose levels greatly improved with recent A1c 7.3.  No further concerns at this time.  Stroke admission 05/30/2019: Mr. Teodoro KilRandolph Lightcap is a 65 y.o. male with hx DB who stopped meds 10 yrs ago and morbid obesity presenting to [facility] ED with dysarthria, left facial droop, double vision and gait unsteadiness since Sunday 8/9.  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  CTA head/neck negative LVO with diffuse intracranial stenosis moderate supraclinoid bilateral ICAs and mild stenosis left VA origin.  2D echo normal EF without cardiac source of embolus identified.  LDL 135.  A1c 10.6.  Recommended DAPT for 3 months then aspirin alone.  Blood pressure stable during admission without prior history of HTN.  Initiated atorvastatin 40 mg daily.  New diagnosis of DM and recommended close PCP follow-up.  Other stroke is factors include morbid obesity and EtOH but no prior history of stroke.  Hospital course complicated by hypoxemic respiratory failure secondary to medullary stroke intubated on 8/15 with trach placement on 8/21, possible aspiration pneumonia  secondary to medullary stroke and dysphagia secondary to medullary stroke with placement of PEG tube on 9/15.  He was discharged to CIR on 06/27/2019 for ongoing therapy with residual deficits.  During admission to CIR, he had improvement of respiratory status and was decannulated on 9/22.  Difficulty with neurogenic bladder requiring intermittent caths and discharged with recommendation on following up with urology outpatient.  He was discharged home in stable condition with recommendation of home health therapy on 07/17/2019.  He unfortunately was readmitted on 07/20/2019 with septic shock due to Klebsiella pneumoniae bacteremia with UTI and hypoxic respiratory failure bilateral lung infiltrates due to rhinovirus.  Required intubation briefly and treated with broad-spectrum antibiotics eventually narrowed with recommendation of 2-week total antibiotic course.  Hospital course significant for AKI with abnormal LFTs due to shock liver, intermittent hypoxic episode as well as significant oral secretions with dysphonia.  He was discharged back to CIR for ongoing therapy due to debility.  Ongoing deficits of severe pharyngeal dysphagia and mild memory deficits affecting recall but showed improvement of activity tolerance, balance and postural control as well as ability to compensate for deficits and was discharged home with home health therapy on 08/08/2019.    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   Diabetes (HCC)    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 (CVA) 08/09/2019   Pneumonia    aspiration pneumonia   Sepsis with acute renal failure without septic shock (HCC)    Septic  shock (HCC)    Stroke (HCC)    Urinary tract infection    Urinary tract infection without hematuria     PSH:  Past Surgical History:  Procedure Laterality Date   CHOLECYSTECTOMY     IR GASTROSTOMY TUBE MOD SED  07/04/2019    Social History:  Social History   Socioeconomic History   Marital status: Married    Spouse name: Not on file   Number of children: Not on file   Years of education: Not on file   Highest education level: Not on file  Occupational History   Not on file  Tobacco Use   Smoking status: Never   Smokeless tobacco: Never  Vaping Use   Vaping Use: Never used  Substance and Sexual Activity   Alcohol use: Yes   Drug use: No   Sexual activity: Yes  Other Topics Concern   Not on file  Social History Narrative   Not on file   Social Determinants of  Health   Financial Resource Strain: Not on file  Food Insecurity: Not on file  Transportation Needs: Not on file  Physical Activity: Not on file  Stress: Not on file  Social Connections: Not on file  Intimate Partner Violence: Not on file    Family History:  Family History  Adopted: Yes    Medications:   Current Outpatient Medications on File Prior to Visit  Medication Sig Dispense Refill   Accu-Chek FastClix Lancets MISC USE AS DIRECTED UP TO FOUR TIMES DAILY 102 each 5   ACCU-CHEK GUIDE test strip USE AS DIRECTED UP TO FOUR TIMES DAILY 100 strip 5   acetaminophen (TYLENOL) 325 MG tablet 325-650 mg by Per G Tube route as needed.     aspirin 81 MG chewable tablet 81 mg by Per G Tube route daily.     atorvastatin (LIPITOR) 40 MG tablet TAKE 1 TABLET (40 MG TOTAL) BY MOUTH AT BEDTIME. FOR CHOLESTEROL. 90 tablet 1   chlorhexidine (PERIDEX) 0.12 % solution RINSE AND SPIT WITH 15 MLS BY MOUTH 2 (TWO) TIMES DAILY. 473 mL 1   finasteride (PROSCAR) 5 MG tablet PLACE ONE TABLET IN GI TUBE ONCE DAILY FOR URINE FLOW. 90 tablet 3   fluticasone (FLONASE) 50 MCG/ACT nasal spray PLACE 1 SPRAY INTO BOTH NOSTRILS 2  (TWO) TIMES DAILY AS NEEDED FOR ALLERGIES OR RHINITIS. 16 mL 2   gabapentin (NEURONTIN) 250 MG/5ML solution TAKE 2.5-5 MLS (125-250 MG TOTAL) BY MOUTH AT BEDTIME. FOR PAIN. 150 mL 6   glycopyrrolate (ROBINUL) 1 MG tablet PLACE 2 TABLETS (2 MG TOTAL) INTO FEEDING TUBE 3 (THREE) TIMES DAILY. FOR ORAL SECRETIONS. 540 tablet 0   guaifenesin (ROBITUSSIN) 100 MG/5ML syrup PLACE 10 MLS (200 MG TOTAL) INTO FEEDING TUBE 4 (FOUR) TIMES DAILY - WITH MEALS AND AT BEDTIME. 948 mL 0   mouth rinse LIQD solution 15 mLs by Mouth Rinse route 5 (five) times daily. 946 mL 1   Nutritional Supplements (FEEDING SUPPLEMENT, OSMOLITE 1.5 CAL,) LIQD Place 325 mLs into feeding tube 4 (four) times daily. (Patient taking differently: Place 237 mLs into feeding tube 4 (four) times daily.)  0   Sennosides (SENNA) 8.8 MG/5ML LIQD 5 mLs by Feeding Tube route 2 (two) times daily as needed. 236 mL 1   sildenafil (VIAGRA) 50 MG tablet Take 1 tablet (50 mg total) by mouth daily as needed for erectile dysfunction. 5 tablet 1   Water For Irrigation, Sterile (FREE WATER) SOLN Place 200 mLs into feeding tube every 8 (eight) hours. Use filtered or bottled water (not distilled water)     No current facility-administered medications on file prior to visit.    Allergies:  No Known Allergies   Physical Exam  Vitals:   04/24/21 1542  BP: 120/69  Pulse: 81  Weight: 273 lb (123.8 kg)  Height: 6' (1.829 m)    Body mass index is 37.03 kg/m. No results found.  General: Obese very pleasant middle-age African-American male, seated, in no evident distress Head: head normocephalic and atraumatic.   Neck: supple with no carotid or supraclavicular bruits Cardiovascular: regular rate and rhythm, no murmurs; +3 pitting edema BLE Musculoskeletal: no deformity Skin:  no rash/petichiae; PEG tube intact Vascular:  Normal pulses all extremities   Neurologic Exam Mental Status: Awake and fully alert. Mild dysarthria. Oriented to place and  time. Recent and remote memory intact. Attention span, concentration and fund of knowledge appropriate. Mood and affect appropriate.  Cranial Nerves: Pupils equal, briskly reactive to light.  Extraocular movements full without nystagmus. Visual fields full to confrontation. Hearing intact. Facial sensation intact.  Slight right lower facial weakness Motor: Normal bulk and tone.  Full strength in all tested extremities. Sensory.:  Diminished light touch sensation RUE proximally and decreased sensory BLE distally Coordination: Rapid alternating movements normal in all extremities. Finger-to-nose and heel-to-shin performed accurately bilaterally. Gait and Station: Arises from chair with mild difficulty. Stance is normal. Gait demonstrates  broad-based gait with normal stride length and balance with use of Rollator walker Reflexes: 1+ and symmetric. Toes downgoing.       ASSESSMENT/PLAN: Orton Capell is a 65 y.o. 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 Vascular risk factors include severe OSA on CPAP, HTN, HLD, DM, EtOH use and morbid obesity.  Prolonged hospital course due to complications including hypoxic respiratory failure with placement of tracheostomy now removed, PEG tube due to dysphagia post stroke, neurogenic bladder post stroke resolved and aspiration pneumonia.  He returned shortly after CIR discharge due to septic shock requiring additional inpatient rehab stay.       Left lateral medullary stroke: Remains stable from stroke standpoint.  Continue aspirin and atorvastatin 40 mg daily for secondary stroke prevention.  Continue to follow closely with PCP for aggressive stroke risk factor management including HTN with BP goal<130/90, HLD with LDL<70 and  DM with A1c goal<7  OSA on CPAP: Personally reviewed compliance report with 87% compliance and 67% compliance over 4 hours residual AHI 3.6.  Advised to contact DME company [employer] as he received a smaller mask size likely contributing to increased leaks.  Continue to use CPAP with at least 4 hours nightly to ensure adequate compliance.     Follow-up in 1 year for CPAP compliance visit or call earlier if needed Overall stable from stroke standpoint and may follow-up on an as-needed basis with consolidating care with PCP for continued agressive stroke risk factor management     CC:  [facility] provider: Dr. Annamarie Dawley, Keane Scrape, NP     I spent 36 minutes of face-to-face and non-face-to-face time with patient and wife.  This included previsit chart review, lab review, study review, order entry, electronic health record documentation, patient education and discussion regarding review of CPAP compliance report and importance of ongoing usage, history of prior stroke with residual deficits as well as secondary stroke prevention measures and aggressive stroke risk factor management and answered all other questions to patient and wife satisfaction   Ihor Austin, AGNP-BC  [facility] [address] [place], [place] [address]  Phone [phone] Fax [phone] Note: This document was prepared with digital dictation and possible smart phrase technology. Any transcriptional errors that result from this process are unintentional.  I reviewed the above note and documentation by the Nurse Practitioner and agree with the history, exam, assessment and plan as outlined above. I was available for consultation. For general neurology, patient will follow with Dr. Pearlean Brownie and Ihor Austin Huston Foley, MD, PhD [facility])

## 2021-04-24 NOTE — Patient Instructions (Addendum)
Your Plan:  Continue nightly use of CPAP machine - we will try to assist with getting you your correct mask  Continue current plan for stroke prevention  And has been having difficulty with increased leaks and has been having difficulty with increased leaks and  Follow up in 1 year or call earlier if needed     Thank you for coming to see [place] at [facility]. I hope we have been able to provide you high quality care today.  You may receive a patient satisfaction survey over the next few weeks. We would appreciate your feedback and comments so that we may continue to improve ourselves and the health of our patients. c

## 2021-05-13 ENCOUNTER — Other Ambulatory Visit: Payer: Self-pay | Admitting: Primary Care

## 2021-05-13 DIAGNOSIS — R6889 Other general symptoms and signs: Secondary | ICD-10-CM

## 2021-05-24 ENCOUNTER — Other Ambulatory Visit: Payer: Self-pay | Admitting: Primary Care

## 2021-05-24 DIAGNOSIS — K5900 Constipation, unspecified: Secondary | ICD-10-CM

## 2021-06-25 ENCOUNTER — Ambulatory Visit (INDEPENDENT_AMBULATORY_CARE_PROVIDER_SITE_OTHER): Payer: BC Managed Care – PPO | Admitting: Podiatry

## 2021-06-25 ENCOUNTER — Other Ambulatory Visit: Payer: Self-pay

## 2021-06-25 ENCOUNTER — Encounter
Payer: BC Managed Care – PPO | Attending: Physical Medicine & Rehabilitation | Admitting: Physical Medicine & Rehabilitation

## 2021-06-25 ENCOUNTER — Encounter: Payer: Self-pay | Admitting: Podiatry

## 2021-06-25 ENCOUNTER — Encounter: Payer: Self-pay | Admitting: Physical Medicine & Rehabilitation

## 2021-06-25 VITALS — BP 122/71 | HR 78 | Temp 99.0°F | Ht 72.0 in | Wt 260.0 lb

## 2021-06-25 DIAGNOSIS — Z8673 Personal history of transient ischemic attack (TIA), and cerebral infarction without residual deficits: Secondary | ICD-10-CM | POA: Diagnosis present

## 2021-06-25 DIAGNOSIS — M79674 Pain in right toe(s): Secondary | ICD-10-CM

## 2021-06-25 DIAGNOSIS — M542 Cervicalgia: Secondary | ICD-10-CM | POA: Diagnosis not present

## 2021-06-25 DIAGNOSIS — E1169 Type 2 diabetes mellitus with other specified complication: Secondary | ICD-10-CM | POA: Diagnosis not present

## 2021-06-25 DIAGNOSIS — E669 Obesity, unspecified: Secondary | ICD-10-CM

## 2021-06-25 DIAGNOSIS — N289 Disorder of kidney and ureter, unspecified: Secondary | ICD-10-CM | POA: Diagnosis not present

## 2021-06-25 DIAGNOSIS — B351 Tinea unguium: Secondary | ICD-10-CM | POA: Diagnosis not present

## 2021-06-25 DIAGNOSIS — M79675 Pain in left toe(s): Secondary | ICD-10-CM

## 2021-06-25 DIAGNOSIS — I69391 Dysphagia following cerebral infarction: Secondary | ICD-10-CM | POA: Diagnosis present

## 2021-06-25 DIAGNOSIS — G464 Cerebellar stroke syndrome: Secondary | ICD-10-CM

## 2021-06-25 NOTE — Patient Instructions (Signed)
TRY HEAT, MASSAGE FOR YOUR NECK.  KEEP YOUR NECK IN NEUTRAL POSITION WITH YOUR SHOULDERS AND PILLOW

## 2021-06-25 NOTE — Progress Notes (Signed)
This patient presents  to my office for at risk foot care.  This patient requires this care by a professional since this patient will be at risk due to having diabetes,CVA and renal insufficiency.  This patient is unable to cut nails himself since the patient cannot reach his nails.These nails are painful walking and wearing shoes. He presents to the office with caregiver. This patient presents for at risk foot care today.  General Appearance  Alert, conversant and in no acute stress.  Vascular  Dorsalis pedis are weakly  palpable  bilaterally. Posterior tibial pulses are absent due to swelling  Bilateral.  Capillary return is within normal limits  bilaterally. Temperature is within normal limits  bilaterally.  Neurologic  Senn-Weinstein monofilament wire test diminished bilaterally. Muscle power within normal limits bilaterally.  Nails Thick disfigured discolored nails with subungual debris  from hallux to fifth toes bilaterally. No evidence of bacterial infection or drainage bilaterally.  Orthopedic  No limitations of motion  feet .  No crepitus or effusions noted.  No bony pathology or digital deformities noted.  Skin  normotropic skin with no porokeratosis noted bilaterally.  No signs of infections or ulcers noted.     Onychomycosis  Pain in right toes  Pain in left toes  Consent was obtained for treatment procedures.   Mechanical debridement of nails 1-5  bilaterally performed with a nail nipper.  Filed with dremel without incident.    Return office visit   3 months                  Told patient to return for periodic foot care and evaluation due to potential at risk complications.   Helane Gunther DPM

## 2021-06-25 NOTE — Progress Notes (Signed)
Subjective:    Patient ID: Michael Bright, male    DOB: 11/21/1955, 65 y.o.   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 He is reporting some pain occasionally in his right neck. The pain will also be worse in the morning.  He tells me that he sleeps on his back usually.  He uses 3 pillows and has his head of his bed raised.  From a swallowing standpoint he reports no new improvements.  He still is only eating for pleasure somewhat.  He tried eating some ground chips and dip recently and it caused some nosebleeding so he stopped the food.  He he is a little concerned about the area around his PEG.  He wanted me to look at that today.  From the standpoint of exercise he seems to have backed off of this a bit and has become more sedentary.  He is using a rolling walker for balance and denies any falls or recent mishaps.     Pain Inventory Average Pain 5 Pain Right Now 5 My pain is intermittent and dull  In the last 24 hours, has pain interfered with the following? General activity 0 Relation with others 0 Enjoyment of life 1 What TIME of day is your pain at its worst? night Sleep (in general) Good  Pain is worse with: inactivity Pain improves with: medication Relief from Meds: 7  Family History  Adopted: Yes   Social History   Socioeconomic History   Marital status: Married    Spouse name: Not on file   Number of children: Not on file   Years of education: Not on file   Highest education level: Not on file  Occupational History   Not on file  Tobacco Use   Smoking status: Never   Smokeless tobacco: Never  Vaping Use   Vaping Use: Never used  Substance and Sexual Activity   Alcohol use: Yes   Drug use: No   Sexual activity: Yes  Other Topics Concern   Not on file  Social History Narrative   Not on file   Social Determinants of Health   Financial Resource Strain: Not on file  Food Insecurity: Not on file   Transportation Needs: Not on file  Physical Activity: Not on file  Stress: Not on file  Social Connections: Not on file   Past Surgical History:  Procedure Laterality Date   CHOLECYSTECTOMY     IR GASTROSTOMY TUBE MOD SED  07/04/2019   Past Surgical History:  Procedure Laterality Date   CHOLECYSTECTOMY     IR GASTROSTOMY TUBE MOD SED  07/04/2019   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   Diabetes (HCC)    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 (CVA) 08/09/2019   Pneumonia    aspiration pneumonia   Sepsis with acute renal failure without septic shock (HCC)    Septic shock (HCC)    Stroke (HCC)    Urinary tract infection    Urinary tract infection without hematuria    BP 122/71   Pulse 78   Temp 99 F (37.2 C)   Ht 6' (1.829 m)   Wt 260 lb (117.9 kg)   SpO2 96%   BMI 35.26 kg/m  Opioid Risk Score:   Fall Risk Score:  `1  Depression screen PHQ 2/9  Depression screen [facility] 2/9 12/18/2020 06/19/2020 09/12/2019 08/22/2019  Decreased Interest 0 0 0 0  Down, Depressed, Hopeless 0 0 0 0  PHQ - 2 Score 0 0 0 0  Altered sleeping - - 0 0  Tired, decreased energy - - 3 3  Change in appetite - - 0 0  Feeling bad or failure about yourself  - - 0 0  Trouble concentrating - - 0 0  Moving slowly or fidgety/restless - - 0 0  Suicidal thoughts - - 0 0  PHQ-9 Score - - 3 3  Difficult doing work/chores - - - Not difficult at all  Some recent data might be hidden    Review of Systems  Constitutional: Negative.   HENT: Negative.    Eyes: Negative.   Respiratory: Negative.    Cardiovascular: Negative.   Gastrointestinal: Negative.   Endocrine: Negative.   Genitourinary: Negative.   Musculoskeletal:  Positive for arthralgias, gait problem and neck pain.  Skin:  Negative.   Neurological:  Positive for numbness.       Tingling  Psychiatric/Behavioral: Negative.    All other systems reviewed and are negative.     Objective:   Physical Exam  General: No acute distress.  Obese HEENT: NCAT, EOMI, oral membranes moist Cards: reg rate  Chest: normal effort Abdomen: Soft, NT, ND.  PEG site with some hypergranulation tissue and minimal bleeding Skin: dry, intact Extremities: no edema Psych: pleasant and appropriate   Neuro: SPEECH dysartrhic but intelligible. Right sided sensory loss present arm/leg. . strength nearly 5/5 in all 4's. ambulates nicely with RW and did surprisingly well without device.  Musculoskeletal: Right trapezius is well sternocleidomastoid are slightly taut with palpation and demonstrates some tenderness as well.  Tenderness is worse with lateral bending to the left.  Posture was fair to good. 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   -Area around the PEG is just hypergranulation tissue.  For now I recommend just keeping it clean.  If it expands further or causes further bleeding we could apply some silver nitrate to it 2 Mild cervicalgia: Again this seems to be mostly muscular and related to his sleeping position.  Recommended some basic stretches which I provided him today as well as use of heat and ice.  Also encouraged him to keep a head neutral position when he sleeps on his back.  3. OSA on CPAP: PCP Following.  3 Pain: maintain gabapentin for neuropathic pain.   4. Fatigue             -optimize sleep             -using CPAP              5.  Mild Dupuytren's contractures of the left third and fourth fingers             - improved 6. Erectile dysfunction        -Viagra as needed   15 Minutes of face to face patient care time were spent  during this visit. All questions were encouraged and answered.  Follow up with me in 12 mos .

## 2021-08-06 ENCOUNTER — Other Ambulatory Visit: Payer: Self-pay | Admitting: Physical Medicine & Rehabilitation

## 2021-08-06 ENCOUNTER — Other Ambulatory Visit: Payer: Self-pay | Admitting: Primary Care

## 2021-08-06 DIAGNOSIS — K5900 Constipation, unspecified: Secondary | ICD-10-CM

## 2021-09-04 ENCOUNTER — Encounter: Payer: Self-pay | Admitting: Primary Care

## 2021-09-04 ENCOUNTER — Ambulatory Visit (INDEPENDENT_AMBULATORY_CARE_PROVIDER_SITE_OTHER): Payer: BC Managed Care – PPO | Admitting: Primary Care

## 2021-09-04 ENCOUNTER — Other Ambulatory Visit: Payer: Self-pay

## 2021-09-04 VITALS — BP 122/60 | HR 77 | Temp 97.6°F | Ht 72.0 in | Wt 257.0 lb

## 2021-09-04 DIAGNOSIS — G4733 Obstructive sleep apnea (adult) (pediatric): Secondary | ICD-10-CM | POA: Diagnosis not present

## 2021-09-04 DIAGNOSIS — K5900 Constipation, unspecified: Secondary | ICD-10-CM

## 2021-09-04 DIAGNOSIS — E785 Hyperlipidemia, unspecified: Secondary | ICD-10-CM

## 2021-09-04 DIAGNOSIS — E118 Type 2 diabetes mellitus with unspecified complications: Secondary | ICD-10-CM | POA: Diagnosis not present

## 2021-09-04 DIAGNOSIS — Z931 Gastrostomy status: Secondary | ICD-10-CM

## 2021-09-04 DIAGNOSIS — D638 Anemia in other chronic diseases classified elsewhere: Secondary | ICD-10-CM | POA: Diagnosis not present

## 2021-09-04 DIAGNOSIS — Z8673 Personal history of transient ischemic attack (TIA), and cerebral infarction without residual deficits: Secondary | ICD-10-CM

## 2021-09-04 DIAGNOSIS — Z0001 Encounter for general adult medical examination with abnormal findings: Secondary | ICD-10-CM | POA: Insufficient documentation

## 2021-09-04 DIAGNOSIS — I10 Essential (primary) hypertension: Secondary | ICD-10-CM | POA: Diagnosis not present

## 2021-09-04 DIAGNOSIS — Z0000 Encounter for general adult medical examination without abnormal findings: Secondary | ICD-10-CM | POA: Diagnosis not present

## 2021-09-04 DIAGNOSIS — R1313 Dysphagia, pharyngeal phase: Secondary | ICD-10-CM

## 2021-09-04 DIAGNOSIS — Z9989 Dependence on other enabling machines and devices: Secondary | ICD-10-CM

## 2021-09-04 DIAGNOSIS — Z1211 Encounter for screening for malignant neoplasm of colon: Secondary | ICD-10-CM | POA: Diagnosis not present

## 2021-09-04 DIAGNOSIS — R6889 Other general symptoms and signs: Secondary | ICD-10-CM

## 2021-09-04 DIAGNOSIS — Z125 Encounter for screening for malignant neoplasm of prostate: Secondary | ICD-10-CM

## 2021-09-04 DIAGNOSIS — R39198 Other difficulties with micturition: Secondary | ICD-10-CM

## 2021-09-04 DIAGNOSIS — R1312 Dysphagia, oropharyngeal phase: Secondary | ICD-10-CM

## 2021-09-04 DIAGNOSIS — R202 Paresthesia of skin: Secondary | ICD-10-CM

## 2021-09-04 LAB — CBC
HCT: 38.9 % — ABNORMAL LOW (ref 39.0–52.0)
Hemoglobin: 12.9 g/dL — ABNORMAL LOW (ref 13.0–17.0)
MCHC: 33 g/dL (ref 30.0–36.0)
MCV: 92.3 fl (ref 78.0–100.0)
Platelets: 190 10*3/uL (ref 150.0–400.0)
RBC: 4.22 Mil/uL (ref 4.22–5.81)
RDW: 12.1 % (ref 11.5–15.5)
WBC: 6.2 10*3/uL (ref 4.0–10.5)

## 2021-09-04 LAB — COMPREHENSIVE METABOLIC PANEL
ALT: 14 U/L (ref 0–53)
AST: 15 U/L (ref 0–37)
Albumin: 3.7 g/dL (ref 3.5–5.2)
Alkaline Phosphatase: 68 U/L (ref 39–117)
BUN: 16 mg/dL (ref 6–23)
CO2: 34 mEq/L — ABNORMAL HIGH (ref 19–32)
Calcium: 9 mg/dL (ref 8.4–10.5)
Chloride: 101 mEq/L (ref 96–112)
Creatinine, Ser: 0.59 mg/dL (ref 0.40–1.50)
GFR: 102.72 mL/min (ref >60.00)
Glucose, Bld: 74 mg/dL (ref 70–99)
Potassium: 4.3 mEq/L (ref 3.5–5.1)
Sodium: 139 mEq/L (ref 135–145)
Total Bilirubin: 0.6 mg/dL (ref 0.2–1.2)
Total Protein: 6.8 g/dL (ref 6.0–8.3)

## 2021-09-04 LAB — MICROALBUMIN / CREATININE URINE RATIO
Creatinine,U: 47.1 mg/dL
Microalb Creat Ratio: 2.6 mg/g (ref 0.0–30.0)
Microalb, Ur: 1.2 mg/dL (ref 0.0–1.9)

## 2021-09-04 LAB — LIPID PANEL
Cholesterol: 94 mg/dL (ref 0–200)
HDL: 47.9 mg/dL (ref >39.00)
LDL Cholesterol: 40 mg/dL (ref 0–99)
NonHDL: 46.25
Total CHOL/HDL Ratio: 2
Triglycerides: 32 mg/dL (ref 0.0–149.0)
VLDL: 6.4 mg/dL (ref 0.0–40.0)

## 2021-09-04 LAB — HEMOGLOBIN A1C: Hgb A1c MFr Bld: 5.6 % (ref 4.6–6.5)

## 2021-09-04 LAB — PSA, MEDICARE: PSA: 0.91 ng/ml (ref 0.10–4.00)

## 2021-09-04 NOTE — Assessment & Plan Note (Signed)
Doing well on gabapentin 125 to 50 mg at bedtime.  Continue same.

## 2021-09-04 NOTE — Assessment & Plan Note (Signed)
Stable, does appear to have improved some since last visit.  He can articulate his words better. Continues to struggle with oropharyngeal dysphagia.  Continue G-tube feedings and G-tube for medications.  Continue statin therapy with goal of LDL less than 70.  Continue diabetes and blood pressure control.

## 2021-09-04 NOTE — Assessment & Plan Note (Addendum)
Compliant to CPAP nightly, continue same.  Will submit order for new suction machine, tubing, wand for which he uses with his CPAP machine.

## 2021-09-04 NOTE — Assessment & Plan Note (Signed)
Stable, no improvement. Continue glycopyrrolate 2 mg 3 times daily, guaifenesin as needed.

## 2021-09-04 NOTE — Assessment & Plan Note (Signed)
Doing well on senna daily, continue same.

## 2021-09-04 NOTE — Assessment & Plan Note (Signed)
Referral placed to interventional radiology for replacement of G-tube.  Continue feedings and medications per G-tube.

## 2021-09-04 NOTE — Assessment & Plan Note (Signed)
Chronic since stroke from 2020.  Will refer patient to IR for G-tube placement. Continue G-tube feedings, G-tube use for medications.

## 2021-09-04 NOTE — Assessment & Plan Note (Signed)
Compliant to atorvastatin 40 mg, continue same. °Repeat lipid panel pending.  °

## 2021-09-04 NOTE — Assessment & Plan Note (Signed)
Appears to be doing well off meds according to glucose readings.  Repeat A1c pending.

## 2021-09-04 NOTE — Assessment & Plan Note (Signed)
Well-controlled in the office today, continue to monitor. Not currently on medication

## 2021-09-04 NOTE — Assessment & Plan Note (Signed)
Doing well on finasteride 5 mg, continue same.

## 2021-09-04 NOTE — Progress Notes (Signed)
Subjective:    Patient ID: Michael Bright, male    DOB: 11/08/1955, 65 y.o.   MRN: [id number]  HPI  Michael Bright is a very pleasant 65 y.o. male who presents today for complete physical and follow up of chronic conditions.  He has noticed a hole into his GI tube.  His GI tube was placed in 2020 during his hospitalization for symptoms of oropharyngeal dysphagia and copious oral secretions secondary to acute stroke.    He is also needing suction tubing, wand, and machine for which he uses nightly with CPAP.   He is checking glucose three times daily which is running in the 80's.  He is no longer on diabetes treatment.  Immunizations: -Influenza: Completed this season  -Covid-19: 4 vaccines  -Shingles: Completed 1 Shingrix -Pneumonia: Pneumovax in 2020  Diet: [place].  Exercise: No regular exercise. Some walking.   Eye exam: Completes semi-annually  Dental exam: No recent exam  Colonoscopy: Completed over 10 years.  PSA: Due  BP Readings from Last 3 Encounters:  09/04/21 122/60  06/25/21 122/71  04/24/21 120/69        Review of Systems  Constitutional:  Negative for unexpected weight change.  HENT:  Negative for rhinorrhea.   Respiratory:  Negative for shortness of breath.   Cardiovascular:  Negative for chest pain.  Gastrointestinal:  Negative for constipation and diarrhea.  Genitourinary:  Negative for difficulty urinating.  Musculoskeletal:  Negative for arthralgias and myalgias.  Skin:  Negative for rash.  Allergic/Immunologic: Negative for environmental allergies.  Neurological:  Negative for dizziness and headaches.  Psychiatric/Behavioral:  The patient is not nervous/anxious.         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   Diabetes ([place])    Neurogenic  bladder as late effect of cerebrovascular accident (CVA) 08/09/2019   Pneumonia    aspiration pneumonia   Sepsis with acute renal failure without septic shock (HCC)    Septic shock (HCC)    Stroke (HCC)    Urinary tract infection    Urinary tract infection without hematuria     Social History   Socioeconomic History   Marital status: Married    Spouse name: Not on file   Number of children: Not on file   Years of education: Not on file   Highest education level: Not on file  Occupational History   Not on file  Tobacco Use   Smoking status: Never   Smokeless tobacco: Never  Vaping Use   Vaping Use: Never used  Substance and Sexual Activity   Alcohol use: Yes   Drug use: No   Sexual activity: Yes  Other Topics Concern   Not on file  Social History Narrative   Not on file   Social Determinants of Health   Financial Resource Strain: Not on file  Food Insecurity: Not on file  Transportation Needs: Not on file  Physical Activity: Not on file  Stress: Not on file  Social Connections: Not on file  Intimate Partner Violence: Not on file    Past Surgical History:  Procedure Laterality Date   CHOLECYSTECTOMY     IR GASTROSTOMY TUBE MOD SED  07/04/2019    Family History  Adopted: Yes    No Known Allergies  Current Outpatient Medications on File Prior  to Visit  Medication Sig Dispense Refill   Accu-Chek FastClix Lancets MISC USE AS DIRECTED UP TO FOUR TIMES DAILY 102 each 5   ACCU-CHEK GUIDE test strip USE AS DIRECTED UP TO FOUR TIMES DAILY 100 strip 5   acetaminophen (TYLENOL) 325 MG tablet 325-650 mg by Per G Tube route as needed.     aspirin 81 MG chewable tablet 81 mg by Per G Tube route daily.     atorvastatin (LIPITOR) 40 MG tablet TAKE 1 TABLET (40 MG TOTAL) BY MOUTH AT BEDTIME. FOR CHOLESTEROL. 90 tablet 1   chlorhexidine (PERIDEX) 0.12 % solution RINSE AND SPIT WITH 15 MLS BY MOUTH 2 (TWO) TIMES DAILY. 473 mL 1   finasteride (PROSCAR) 5 MG tablet PLACE ONE  TABLET IN GI TUBE ONCE DAILY FOR URINE FLOW. 90 tablet 3   fluticasone (FLONASE) 50 MCG/ACT nasal spray PLACE 1 SPRAY INTO BOTH NOSTRILS 2 (TWO) TIMES DAILY AS NEEDED FOR ALLERGIES OR RHINITIS. 16 mL 2   gabapentin (NEURONTIN) 250 MG/5ML solution TAKE 2.5-5 MLS (125-250 MG TOTAL) BY MOUTH AT BEDTIME. FOR PAIN. 150 mL 6   glycopyrrolate (ROBINUL) 1 MG tablet PLACE 2 TABLETS (2 MG TOTAL) INTO FEEDING TUBE 3 (THREE) TIMES DAILY. FOR ORAL SECRETIONS. 540 tablet 2   guaifenesin (ROBITUSSIN) 100 MG/5ML syrup PLACE 10 MLS (200 MG TOTAL) INTO FEEDING TUBE 4 (FOUR) TIMES DAILY - WITH MEALS AND AT BEDTIME. 948 mL 0   mouth rinse LIQD solution 15 mLs by Mouth Rinse route 5 (five) times daily. 946 mL 1   Nutritional Supplements (FEEDING SUPPLEMENT, OSMOLITE 1.5 CAL,) LIQD Place 325 mLs into feeding tube 4 (four) times daily. (Patient taking differently: Place 237 mLs into feeding tube 4 (four) times daily.)  0   Sennosides (SENNA) 8.8 MG/5ML SYRP 5 MLS BY FEEDING TUBE ROUTE 2 (TWO) TIMES DAILY AS NEEDED. 237 mL 1   sildenafil (VIAGRA) 50 MG tablet Take 1 tablet (50 mg total) by mouth daily as needed for erectile dysfunction. 5 tablet 1   Water For Irrigation, Sterile (FREE WATER) SOLN Place 200 mLs into feeding tube every 8 (eight) hours. Use filtered or bottled water (not distilled water)     No current facility-administered medications on file prior to visit.    BP 122/60   Pulse 77   Temp 97.6 F (36.4 C) (Temporal)   Ht 6' (1.829 m)   Wt 257 lb (116.6 kg)   SpO2 97%   BMI 34.86 kg/m  Objective:   Physical Exam HENT:     Right Ear: External ear normal.     Left Ear: External ear normal.     Nose: Nose normal.     Right Sinus: No maxillary sinus tenderness or frontal sinus tenderness.     Left Sinus: No maxillary sinus tenderness or frontal sinus tenderness.  Eyes:     Conjunctiva/sclera: Conjunctivae normal.  Neck:     Thyroid: No thyromegaly.     Vascular: No carotid bruit.   Cardiovascular:     Rate and Rhythm: Normal rate and regular rhythm.     Heart sounds: Normal heart sounds.  Pulmonary:     Effort: Pulmonary effort is normal.     Breath sounds: Normal breath sounds. No wheezing or rales.  Abdominal:     General: Bowel sounds are normal.     Palpations: Abdomen is soft.     Tenderness: There is no abdominal tenderness.  Musculoskeletal:        General: Normal range  of motion.     Cervical back: Neck supple.     Comments: Ambulates well with walker in office.  Skin:    General: Skin is warm and dry.     Comments: G-tube site appears to be intact.  Neurological:     Mental Status: He is alert and oriented to person, place, and time.     Cranial Nerves: No cranial nerve deficit.     Deep Tendon Reflexes: Reflexes are normal and symmetric.  Psychiatric:        Mood and Affect: Mood normal.          Assessment & Plan:      This visit occurred during the SARS-CoV-2 public health emergency.  Safety protocols were in place, including screening questions prior to the visit, additional usage of staff PPE, and extensive cleaning of exam room while observing appropriate contact time as indicated for disinfecting solutions.

## 2021-09-04 NOTE — Assessment & Plan Note (Signed)
Patient will obtain tetanus vaccine from pharmacy. Other vaccines up-to-date.  PSA due and pending. Colonoscopy due, referral placed to GI.  Encouraged healthy diet and regular activity.  Exam today stable. Labs pending.

## 2021-09-04 NOTE — Patient Instructions (Signed)
Stop by the lab prior to leaving today. I will notify you of your results once received.   Please contact me in 1 week if you do not hear from someone regarding your G-tube replacement and/or the suction machine with equipment.   It was a pleasure to see you today!  Preventive Care 64-64 Years Old, Male Preventive care refers to lifestyle choices and visits with your health care provider that can promote health and wellness. Preventive care visits are also called wellness exams. What can I expect for my preventive care visit? Counseling During your preventive care visit, your health care provider may ask about your: Medical history, including: Past medical problems. Family medical history. Current health, including: Emotional well-being. Home life and relationship well-being. Sexual activity. Lifestyle, including: Alcohol, nicotine or tobacco, and drug use. Access to firearms. Diet, exercise, and sleep habits. Safety issues such as seatbelt and bike helmet use. Sunscreen use. Work and work Astronomer. Physical exam Your health care provider will check your: Height and weight. These may be used to calculate your BMI (body mass index). BMI is a measurement that tells if you are at a healthy weight. Waist circumference. This measures the distance around your waistline. This measurement also tells if you are at a healthy weight and may help predict your risk of certain diseases, such as type 2 diabetes and high blood pressure. Heart rate and blood pressure. Body temperature. Skin for abnormal spots. What immunizations do I need? Vaccines are usually given at various ages, according to a schedule. Your health care provider will recommend vaccines for you based on your age, medical history, and lifestyle or other factors, such as travel or where you work. What tests do I need? Screening Your health care provider may recommend screening tests for certain conditions. This may  include: Lipid and cholesterol levels. Diabetes screening. This is done by checking your blood sugar (glucose) after you have not eaten for a while (fasting). Hepatitis B test. Hepatitis C test. HIV (human immunodeficiency virus) test. STI (sexually transmitted infection) testing, if you are at risk. Lung cancer screening. Prostate cancer screening. Colorectal cancer screening. Talk with your health care provider about your test results, treatment options, and if necessary, the need for more tests. Follow these instructions at home: Eating and drinking  Eat a diet that includes fresh fruits and vegetables, whole grains, lean protein, and low-fat dairy products. Take vitamin and mineral supplements as recommended by your health care provider. Do not drink alcohol if your health care provider tells you not to drink. If you drink alcohol: Limit how much you have to 0-2 drinks a day. Know how much alcohol is in your drink. In the [place] one drink equals one 12 oz bottle of beer (355 mL), one 5 oz glass of wine (148 mL), or one 1 oz glass of hard liquor (44 mL). Lifestyle Brush your teeth every morning and night with fluoride toothpaste. Floss one time each day. Exercise for at least 30 minutes 5 or more days each week. Do not use any products that contain nicotine or tobacco. These products include cigarettes, chewing tobacco, and vaping devices, such as e-cigarettes. If you need help quitting, ask your health care provider. Do not use drugs. If you are sexually active, practice safe sex. Use a condom or other form of protection to prevent STIs. Take aspirin only as told by your health care provider. Make sure that you understand how much to take and what form to take. Work with  your health care provider to find out whether it is safe and beneficial for you to take aspirin daily. Find healthy ways to manage stress, such as: Meditation, yoga, or listening to music. Journaling. Talking to a  trusted person. Spending time with friends and family. Minimize exposure to UV radiation to reduce your risk of skin cancer. Safety Always wear your seat belt while driving or riding in a vehicle. Do not drive: If you have been drinking alcohol. Do not ride with someone who has been drinking. When you are tired or distracted. While texting. If you have been using any mind-altering substances or drugs. Wear a helmet and other protective equipment during sports activities. If you have firearms in your house, make sure you follow all gun safety procedures. What's next? Go to your health care provider once a year for an annual wellness visit. Ask your health care provider how often you should have your eyes and teeth checked. Stay up to date on all vaccines. This information is not intended to replace advice given to you by your health care provider. Make sure you discuss any questions you have with your health care provider. Document Revised: 04/02/2021 Document Reviewed: 04/02/2021 Elsevier Patient Education  2022 [employer].

## 2021-09-05 ENCOUNTER — Other Ambulatory Visit: Payer: Self-pay | Admitting: Primary Care

## 2021-09-05 DIAGNOSIS — R6889 Other general symptoms and signs: Secondary | ICD-10-CM

## 2021-09-05 DIAGNOSIS — R1312 Dysphagia, oropharyngeal phase: Secondary | ICD-10-CM

## 2021-09-05 DIAGNOSIS — Z931 Gastrostomy status: Secondary | ICD-10-CM

## 2021-09-17 ENCOUNTER — Telehealth (HOSPITAL_COMMUNITY): Payer: Self-pay

## 2021-09-17 NOTE — Telephone Encounter (Signed)
Called to schedule a peg replacement, no answer, left vm. AW

## 2021-09-18 ENCOUNTER — Telehealth (HOSPITAL_COMMUNITY): Payer: Self-pay

## 2021-09-18 NOTE — Telephone Encounter (Signed)
Called to schedule peg replacement, no answer, left vm. AW  

## 2021-09-24 ENCOUNTER — Ambulatory Visit (INDEPENDENT_AMBULATORY_CARE_PROVIDER_SITE_OTHER): Payer: Medicare Other | Admitting: Podiatry

## 2021-09-24 ENCOUNTER — Other Ambulatory Visit: Payer: Self-pay

## 2021-09-24 DIAGNOSIS — M79674 Pain in right toe(s): Secondary | ICD-10-CM

## 2021-09-24 DIAGNOSIS — M79675 Pain in left toe(s): Secondary | ICD-10-CM

## 2021-09-24 DIAGNOSIS — E669 Obesity, unspecified: Secondary | ICD-10-CM

## 2021-09-24 DIAGNOSIS — E1169 Type 2 diabetes mellitus with other specified complication: Secondary | ICD-10-CM

## 2021-09-24 DIAGNOSIS — B351 Tinea unguium: Secondary | ICD-10-CM

## 2021-09-24 DIAGNOSIS — N289 Disorder of kidney and ureter, unspecified: Secondary | ICD-10-CM

## 2021-09-24 NOTE — Progress Notes (Signed)
This patient presents  to my office for at risk foot care.  This patient requires this care by a professional since this patient will be at risk due to having diabetes,CVA and renal insufficiency.  This patient is unable to cut nails himself since the patient cannot reach his nails.These nails are painful walking and wearing shoes. He presents to the office with caregiver. This patient presents for at risk foot care today.  General Appearance  Alert, conversant and in no acute stress.  Vascular  Dorsalis pedis are weakly  palpable  bilaterally. Posterior tibial pulses are absent due to swelling  Bilateral.  Capillary return is within normal limits  bilaterally. Temperature is within normal limits  bilaterally.  Neurologic  Senn-Weinstein monofilament wire test diminished bilaterally. Muscle power within normal limits bilaterally.  Nails Thick disfigured discolored nails with subungual debris  from hallux to fifth toes bilaterally. No evidence of bacterial infection or drainage bilaterally.  Orthopedic  No limitations of motion  feet .  No crepitus or effusions noted.  No bony pathology or digital deformities noted.  Skin  normotropic skin with no porokeratosis noted bilaterally.  No signs of infections or ulcers noted.     Onychomycosis  Pain in right toes  Pain in left toes  Consent was obtained for treatment procedures.   Mechanical debridement of nails 1-5  bilaterally performed with a nail nipper.  Filed with dremel without incident.    Return office visit   3 months                  Told patient to return for periodic foot care and evaluation due to potential at risk complications.   Helane Gunther DPM

## 2021-09-28 ENCOUNTER — Other Ambulatory Visit: Payer: Self-pay | Admitting: Primary Care

## 2021-09-28 DIAGNOSIS — E1169 Type 2 diabetes mellitus with other specified complication: Secondary | ICD-10-CM

## 2021-09-29 NOTE — Telephone Encounter (Signed)
LAST APPOINTMENT DATE: 09/04/21   NEXT APPOINTMENT DATE: Visit date not found    LAST REFILL: 03/04/21  QTY: 100 5 rf

## 2021-10-13 ENCOUNTER — Other Ambulatory Visit: Payer: Self-pay | Admitting: Primary Care

## 2021-10-13 DIAGNOSIS — E785 Hyperlipidemia, unspecified: Secondary | ICD-10-CM

## 2021-10-21 ENCOUNTER — Ambulatory Visit (INDEPENDENT_AMBULATORY_CARE_PROVIDER_SITE_OTHER): Payer: BC Managed Care – PPO | Admitting: Gastroenterology

## 2021-10-21 ENCOUNTER — Encounter: Payer: Self-pay | Admitting: Gastroenterology

## 2021-10-21 VITALS — BP 120/70 | HR 71 | Ht 72.0 in | Wt 256.5 lb

## 2021-10-21 DIAGNOSIS — R1312 Dysphagia, oropharyngeal phase: Secondary | ICD-10-CM

## 2021-10-21 DIAGNOSIS — Z8673 Personal history of transient ischemic attack (TIA), and cerebral infarction without residual deficits: Secondary | ICD-10-CM | POA: Diagnosis not present

## 2021-10-21 DIAGNOSIS — Z1211 Encounter for screening for malignant neoplasm of colon: Secondary | ICD-10-CM

## 2021-10-21 MED ORDER — NA SULFATE-K SULFATE-MG SULF 17.5-3.13-1.6 GM/177ML PO SOLN: 1.0000 | Freq: Once | ORAL | 0 refills | Status: AC

## 2021-10-21 NOTE — Patient Instructions (Signed)
If you are age 65 or older, your body mass index should be between 23-30. Your Body mass index is 34.79 kg/m. If this is out of the aforementioned range listed, please consider follow up with your Primary Care Provider.  If you are age 17 or younger, your body mass index should be between 19-25. Your Body mass index is 34.79 kg/m. If this is out of the aformentioned range listed, please consider follow up with your Primary Care Provider.   You have been scheduled for a colonoscopy. Please follow written instructions given to you at your visit today.  Please pick up your prep supplies at the pharmacy within the next 1-3 days. If you use inhalers (even only as needed), please bring them with you on the day of your procedure.   ________________________________________________________  The [place] [facility] providers would like to encourage you to use [facility] to communicate with providers for non-urgent requests or questions.  Due to long hold times on the telephone, sending your provider a message by [facility] may be a faster and more efficient way to get a response.  Please allow 48 business hours for a response.  Please remember that this is for non-urgent requests.   It was a pleasure to see you today!  Thank you for trusting me with your gastrointestinal care!    Scott E.Tomasa Rand, MD

## 2021-10-21 NOTE — Progress Notes (Signed)
HPI : Michael Bright is a very pleasant 66 year old male with a history of stroke in August [id number] complicated by aspiration pneumonia with sepsis, with residual oropharyngeal dysphagia/feeding tube dependent is referred to [place] by Alma Friendly, NP for screening colonoscopy.  The patient reports having a colonoscopy over 10 years ago for colon cancer screening by Dr. Lajoyce Corners.  Patient states this was a normal colonoscopy.  Other than his oropharyngeal dysphagia, he did denies any other chronic GI symptoms.  He does have chronic constipation but this is well controlled with once daily senna.  He has been taking senna for years and has a bowel movement most days on this regimen.  Stools are often poorly formed.  He denies any problems with incontinence or difficulty with control of his bowels.  No blood in stool.  No problems with abdominal pain, nausea or vomiting.  He is able to swallow, but he takes about 90% of his nutrition through his gastrostomy tube.  He does drink milk and eats yogurt on occasion.  He has not had any aspiration events since his hospitalization in 2020.  He has no known cardiopulmonary disease. He currently still has the original gastrostomy tube that was placed in 2020.  A small hole developed in the tubing, which is currently addressed with medical tape.  He has a referral to IR to replace the tube, but has not been contacted about an appointment.  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   Diabetes ([place])    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 (CVA) 08/09/2019   Pneumonia    aspiration pneumonia   Renal insufficiency 05/30/2019   Sepsis with acute renal failure without septic shock (HCC)    Septic shock (HCC)    Status post insertion of percutaneous endoscopic  gastrostomy (PEG) tube (HCC)    Stroke (HCC)    Urinary tract infection    Urinary tract infection without hematuria      Past Surgical History:  Procedure Laterality Date   CHOLECYSTECTOMY     IR GASTROSTOMY TUBE MOD SED  07/04/2019   Family History  Adopted: Yes   Social History   Tobacco Use   Smoking status: Never   Smokeless tobacco: Never  Vaping Use   Vaping Use: Never used  Substance Use Topics   Alcohol use: Yes   Drug use: No   Current Outpatient Medications  Medication Sig Dispense Refill   Accu-Chek FastClix Lancets MISC USE AS DIRECTED UP TO FOUR TIMES DAILY 102 each 5   acetaminophen (TYLENOL) 325 MG tablet 325-650 mg by Per G Tube route as needed.     aspirin 81 MG chewable tablet 81 mg by Per G Tube route daily.     atorvastatin (LIPITOR) 40 MG tablet TAKE 1 TABLET (40 MG TOTAL) BY MOUTH AT BEDTIME. FOR CHOLESTEROL. 90 tablet 3   chlorhexidine (PERIDEX) 0.12 % solution RINSE AND SPIT WITH 15 MLS BY MOUTH 2 (TWO) TIMES DAILY. 473 mL 1   finasteride (PROSCAR) 5 MG tablet PLACE ONE TABLET IN GI TUBE ONCE DAILY FOR URINE FLOW. 90 tablet 3   fluticasone (FLONASE) 50 MCG/ACT nasal spray PLACE 1 SPRAY INTO BOTH NOSTRILS 2 (TWO) TIMES DAILY AS NEEDED FOR ALLERGIES OR RHINITIS. 16 mL 2   gabapentin (NEURONTIN) 250 MG/5ML solution  TAKE 2.5-5 MLS (125-250 MG TOTAL) BY MOUTH AT BEDTIME. FOR PAIN. 150 mL 6   glucose blood (ACCU-CHEK GUIDE) test strip USE AS DIRECTED UP TO FOUR TIMES DAILY 400 strip 3   glycopyrrolate (ROBINUL) 1 MG tablet PLACE 2 TABLETS (2 MG TOTAL) INTO FEEDING TUBE 3 (THREE) TIMES DAILY. FOR ORAL SECRETIONS. 540 tablet 2   guaifenesin (ROBITUSSIN) 100 MG/5ML syrup PLACE 10 MLS (200 MG TOTAL) INTO FEEDING TUBE 4 (FOUR) TIMES DAILY - WITH MEALS AND AT BEDTIME. 948 mL 0   mouth rinse LIQD solution 15 mLs by Mouth Rinse route 5 (five) times daily. 946 mL 1   Nutritional Supplements (FEEDING SUPPLEMENT, OSMOLITE 1.5 CAL,) LIQD Place 325 mLs into feeding tube 4  (four) times daily. (Patient taking differently: Place 237 mLs into feeding tube 4 (four) times daily.)  0   Sennosides (SENNA) 8.8 MG/5ML SYRP 5 MLS BY FEEDING TUBE ROUTE 2 (TWO) TIMES DAILY AS NEEDED. 237 mL 1   sildenafil (VIAGRA) 50 MG tablet Take 1 tablet (50 mg total) by mouth daily as needed for erectile dysfunction. 5 tablet 1   Water For Irrigation, Sterile (FREE WATER) SOLN Place 200 mLs into feeding tube every 8 (eight) hours. Use filtered or bottled water (not distilled water)     No current facility-administered medications for this visit.   No Known Allergies   Review of Systems: All systems reviewed and negative except where noted in HPI.    No results found.  Physical Exam: BP 120/70    Pulse 71    Ht 6' (1.829 m)    Wt 256 lb 8 oz (116.3 kg)    BMI 34.79 kg/m  Constitutional: Pleasant,well-developed, African-American male in no acute distress.  Accompanied by spouse HEENT: Normocephalic and atraumatic. Conjunctivae are normal. No scleral icterus. Cardiovascular: Normal rate, regular rhythm.  Pulmonary/chest: Effort normal and breath sounds normal. No wheezing, rales or rhonchi. Abdominal: Soft, nondistended, nontender. Bowel sounds active throughout. There are no masses palpable. No hepatomegaly.  Gastrostomy tube exiting left upper quadrant, rotates easily, gastrostomy tubing discolored, suctioning of tubing covered with adhesive tape Extremities: no edema Neurological: Alert and oriented to person place and time.  Some dysarthria noted, but patient able to communicate effectively, able to transfer from chair to medical examination table with minimal assistance. Skin: Skin is warm and dry. No rashes noted. Psychiatric: Normal mood and affect. Behavior is normal.  CBC    Component Value Date/Time   WBC 6.2 09/04/2021 1217   RBC 4.22 09/04/2021 1217   HGB 12.9 (L) 09/04/2021 1217   HGB 12.9 (L) 11/01/2019 0927   HCT 38.9 (L) 09/04/2021 1217   HCT 38.6 11/01/2019  0927   PLT 190.0 09/04/2021 1217   PLT 204 11/01/2019 0927   MCV 92.3 09/04/2021 1217   MCV 92 11/01/2019 0927   MCH 30.4 08/23/2020 1656   MCHC 33.0 09/04/2021 1217   RDW 12.1 09/04/2021 1217   RDW 12.8 11/01/2019 0927   LYMPHSABS 4.0 07/27/2019 0651   MONOABS 0.8 07/27/2019 0651   EOSABS 0.1 07/27/2019 0651   BASOSABS 0.1 07/27/2019 0651    CMP     Component Value Date/Time   NA 139 09/04/2021 1217   NA 138 11/01/2019 0927   K 4.3 09/04/2021 1217   CL 101 09/04/2021 1217   CO2 34 (H) 09/04/2021 1217   GLUCOSE 74 09/04/2021 1217   BUN 16 09/04/2021 1217   BUN 17 11/01/2019 0927   CREATININE 0.59 09/04/2021 1217  CREATININE 0.57 (L) 08/23/2020 1656   CALCIUM 9.0 09/04/2021 1217   PROT 6.8 09/04/2021 1217   PROT 6.9 11/01/2019 0927   ALBUMIN 3.7 09/04/2021 1217   ALBUMIN 3.8 11/01/2019 0927   AST 15 09/04/2021 1217   ALT 14 09/04/2021 1217   ALKPHOS 68 09/04/2021 1217   BILITOT 0.6 09/04/2021 1217   BILITOT 0.3 11/01/2019 0927   GFRNONAA 102 11/01/2019 0927   GFRAA 118 11/01/2019 0927     ASSESSMENT AND PLAN: 66 year old male with history of stroke in [id number] complicated by aspiration pneumonia and sepsis, now with residual oropharyngeal dysphagia/feeding tube dependency, due for screening colonoscopy.  The patient has been very clinically stable since his stroke in 2020.  It seems reasonable to proceed with colon cancer screening in this patient we discussed stool-based test versus a colonoscopy and the patient and his wife are both in favor of pursuing a colonoscopy.  We discussed the increased risk of aspiration due to his oropharyngeal dysphagia, but that his risk should still be low as long as his stomach is empty at the time of his procedure.  I feel the patient is a reasonable candidate to have his procedure at the with our endoscopy center, but will also discuss with our head CRNA to ensure there are no concerns from his end.  His gastrostomy tube definitely needs to  be replaced, but is currently functioning.  Advised him to contact IR to schedule that procedure.  CRC screening - Colonoscopy  PEG tube malfunction - Replacement via IR  The details, risks (including bleeding, perforation, infection, missed lesions, medication reactions and possible hospitalization or surgery if complications occur), benefits, and alternatives to colonoscopy with possible biopsy and possible polypectomy were discussed with the patient and he consents to proceed.    CC:  Pleas Koch, NP

## 2021-10-23 ENCOUNTER — Encounter: Payer: Self-pay | Admitting: Certified Registered Nurse Anesthetist

## 2021-10-24 ENCOUNTER — Ambulatory Visit (AMBULATORY_SURGERY_CENTER): Payer: BC Managed Care – PPO | Admitting: Gastroenterology

## 2021-10-24 ENCOUNTER — Encounter: Payer: Self-pay | Admitting: Gastroenterology

## 2021-10-24 VITALS — BP 145/70 | HR 78 | Temp 99.1°F | Resp 17 | Ht 72.0 in | Wt 256.0 lb

## 2021-10-24 DIAGNOSIS — Z1211 Encounter for screening for malignant neoplasm of colon: Secondary | ICD-10-CM

## 2021-10-24 MED ORDER — SODIUM CHLORIDE 0.9 % IV SOLN: 500.0000 mL | Freq: Once | INTRAVENOUS | Status: DC

## 2021-10-24 NOTE — Patient Instructions (Signed)
Please read handouts provided. Continue present medications. Resume previous diet. Repeat colonoscopy in 5 years.   YOU HAD AN ENDOSCOPIC PROCEDURE TODAY AT THE [facility]:   Refer to the procedure report that was given to you for any specific questions about what was found during the examination.  If the procedure report does not answer your questions, please call your gastroenterologist to clarify.  If you requested that your care partner not be given the details of your procedure findings, then the procedure report has been included in a sealed envelope for you to review at your convenience later.  YOU SHOULD EXPECT: Some feelings of bloating in the abdomen. Passage of more gas than usual.  Walking can help get rid of the air that was put into your GI tract during the procedure and reduce the bloating. If you had a lower endoscopy (such as a colonoscopy or flexible sigmoidoscopy) you may notice spotting of blood in your stool or on the toilet paper. If you underwent a bowel prep for your procedure, you may not have a normal bowel movement for a few days.  Please Note:  You might notice some irritation and congestion in your nose or some drainage.  This is from the oxygen used during your procedure.  There is no need for concern and it should clear up in a day or so.  SYMPTOMS TO REPORT IMMEDIATELY:  Following lower endoscopy (colonoscopy or flexible sigmoidoscopy):  Excessive amounts of blood in the stool  Significant tenderness or worsening of abdominal pains  Swelling of the abdomen that is new, acute  Fever of 100F or higher    For urgent or emergent issues, a gastroenterologist can be reached at any hour by calling [phone]. Do not use MyChart messaging for urgent concerns.    DIET:  We do recommend a small meal at first, but then you may proceed to your regular diet.  Drink plenty of fluids but you should avoid alcoholic beverages for 24 hours.  ACTIVITY:  You  should plan to take it easy for the rest of today and you should NOT DRIVE or use heavy machinery until tomorrow (because of the sedation medicines used during the test).    FOLLOW UP: Our staff will call the number listed on your records 48-72 hours following your procedure to check on you and address any questions or concerns that you may have regarding the information given to you following your procedure. If we do not reach you, we will leave a message.  We will attempt to reach you two times.  During this call, we will ask if you have developed any symptoms of COVID 19. If you develop any symptoms (ie: fever, flu-like symptoms, shortness of breath, cough etc.) before then, please call [phone].  If you test positive for Covid 19 in the 2 weeks post procedure, please call and report this information to [place].    If any biopsies were taken you will be contacted by phone or by letter within the next 1-3 weeks.  Please call us at [phone] if you have not heard about the biopsies in 3 weeks.    SIGNATURES/CONFIDENTIALITY: You and/or your care partner have signed paperwork which will be entered into your electronic medical record.  These signatures attest to the fact that that the information above on your After Visit Summary has been reviewed and is understood.  Full responsibility of the confidentiality of this discharge information lies with you and/or your care-partner.

## 2021-10-24 NOTE — Progress Notes (Signed)
Vitals-SM  Pt's states no medical or surgical changes since previsit or office visit. 

## 2021-10-24 NOTE — Op Note (Signed)
[facility] Patient Name: Michael Bright Procedure Date: 10/24/2021 8:54 AM MRN: [id number] Endoscopist: Nicki Reaper E. Candis Schatz , MD Age: 66 Referring MD:  Date of Birth: 01/15/1956 Gender: Male Account #: [id number] Procedure:                Colonoscopy Indications:              Screening for colorectal malignant neoplasm Medicines:                Monitored Anesthesia Care Procedure:                Pre-Anesthesia Assessment:                           - Prior to the procedure, a History and Physical                            was performed, and patient medications and                            allergies were reviewed. The patient's tolerance of                            previous anesthesia was also reviewed. The risks                            and benefits of the procedure and the sedation                            options and risks were discussed with the patient.                            All questions were answered, and informed consent                            was obtained. Prior Anticoagulants: The patient has                            taken no previous anticoagulant or antiplatelet                            agents except for aspirin. ASA Grade Assessment:                            III - A patient with severe systemic disease. After                            reviewing the risks and benefits, the patient was                            deemed in satisfactory condition to undergo the                            procedure.  After obtaining informed consent, the colonoscope                            was passed under direct vision. Throughout the                            procedure, the patient's blood pressure, pulse, and                            oxygen saturations were monitored continuously. The                            Colonoscope was introduced through the anus and                            advanced to the the terminal ileum, with                             identification of the appendiceal orifice and IC                            valve. The colonoscopy was unusually difficult due                            to inadequate bowel prep, a redundant colon and                            significant looping. Successful completion of the                            procedure was aided by changing the patient to a                            supine position, changing the patient to a prone                            position, using manual pressure, a ColoWrap and                            withdrawing and reinserting the scope. The patient                            tolerated the procedure well. The quality of the                            bowel preparation was fair. The terminal ileum,                            ileocecal valve, appendiceal orifice, and rectum                            were photographed. The bowel preparation used was  SUPREP via split dose instruction. Scope In: 9:16:46 AM Scope Out: 10:01:49 AM Scope Withdrawal Time: 0 hours 16 minutes 13 seconds  Total Procedure Duration: 0 hours 45 minutes 3 seconds  Findings:                 The perianal and digital rectal examinations were                            normal. Pertinent negatives include normal                            sphincter tone and no palpable rectal lesions.                           The colon (entire examined portion) appeared normal.                           The terminal ileum appeared normal.                           Non-bleeding internal hemorrhoids were found during                            retroflexion. The hemorrhoids were Grade I                            (internal hemorrhoids that do not prolapse).                           No additional abnormalities were found on                            retroflexion. Complications:            No immediate complications. Estimated Blood Loss:     Estimated blood loss:  none. Impression:               - Preparation of the colon was fair. Large polyps                            and masses were successfully excluded, but small                            polyps may have been obscured by adherent liquid                            stool                           - The entire examined colon is normal.                           - The examined portion of the ileum was normal.                           - Non-bleeding internal hemorrhoids.                           -  No specimens collected. Recommendation:           - Discharge patient to home.                           - Resume previous diet.                           - Continue present medications.                           - Repeat colon cancer screening in 5 years because                            the bowel preparation was suboptimal. Would                            consider stool-based screening tests for subsequent                            screening.                           - Would recommend a 2 day bowel prep and use of                            ColoWrap for any subsequent colonoscopies. [name] E. Candis Schatz, MD 10/24/2021 10:11:59 AM This report has been signed electronically.

## 2021-10-24 NOTE — Progress Notes (Signed)
Report given to PACU, vss 

## 2021-10-24 NOTE — Progress Notes (Signed)
0912 Robinul 0.2 mg IV given due large amount of secretions upon assessment.  MD made aware, vss

## 2021-10-24 NOTE — Progress Notes (Signed)
History and Physical Interval Note:  10/24/2021 8:58 AM  Michael Bright  has presented today for endoscopic procedure(s), with the diagnosis of  Encounter Diagnosis  Name Primary?   Screening for colon cancer Yes  .  The various methods of evaluation and treatment have been discussed with the patient and/or family. After consideration of risks, benefits and other options for treatment, the patient has consented to  the endoscopic procedure(s).   The patient's history has been reviewed, patient examined, no change in status, stable for endoscopic procedure(s).  I have reviewed the patient's chart and labs.  Questions were answered to the patient's satisfaction.     Ajay Strubel E. Tomasa Rand, MD [facility]

## 2021-10-28 ENCOUNTER — Telehealth: Payer: Self-pay

## 2021-10-28 NOTE — Telephone Encounter (Signed)
Left message on answering machine. 

## 2021-12-08 ENCOUNTER — Telehealth: Payer: Self-pay | Admitting: Primary Care

## 2021-12-08 NOTE — Telephone Encounter (Signed)
Ms. Gironda called in and wanted to know if he can get in sooner due to his feeding tube is leaking and its to the point he cant take medicine.

## 2021-12-08 NOTE — Telephone Encounter (Signed)
Called patient spoke to wife. States that patient has small holes that are in his tube. States it started about 5 months ago but has increased in last few week. States leak is about 6 inches from where they inject medications with syringe. Does he need to see you for this or who put in feeding tube?

## 2021-12-08 NOTE — Telephone Encounter (Signed)
He will need to be seen by interventional radiology for new PEG tube placement.  Can we call IR at [facility] or [facility] to find out the correct order for his PEG tube placement?  Once we placed the order, he should be contacted by IR to switch out his tube.

## 2021-12-09 ENCOUNTER — Telehealth (HOSPITAL_COMMUNITY): Payer: Self-pay

## 2021-12-09 LAB — HM DIABETES EYE EXAM

## 2021-12-09 NOTE — Telephone Encounter (Signed)
Received call back from IR order in is correct. They will reach out to patient to schedule. I have tried to call patient to cancel appointment with Jae Dire but not able ot reach. Left voice mail to call back so that we can cancel apportionment and let them know to expect call from interventional  radiology

## 2021-12-09 NOTE — Telephone Encounter (Signed)
Called to schedule peg replacement, no answer, left vm. AW  

## 2021-12-09 NOTE — Telephone Encounter (Signed)
Called [facility] transferred to IR. On message was advised to call numbers listed below. Have called and left message to call office to get information requested. I have called and left message on both numbers for call back.   [phone]

## 2021-12-10 ENCOUNTER — Telehealth (HOSPITAL_COMMUNITY): Payer: Self-pay

## 2021-12-10 NOTE — Telephone Encounter (Signed)
Called to schedule peg replacement, no answer, left vm. AW  

## 2021-12-11 ENCOUNTER — Other Ambulatory Visit (HOSPITAL_COMMUNITY): Payer: Self-pay | Admitting: Primary Care

## 2021-12-11 ENCOUNTER — Ambulatory Visit (HOSPITAL_COMMUNITY)
Admission: RE | Admit: 2021-12-11 | Discharge: 2021-12-11 | Disposition: A | Discharge status: Home / Self Care | Payer: BC Managed Care – PPO | Source: Ambulatory Visit | Attending: Primary Care | Admitting: Primary Care

## 2021-12-11 ENCOUNTER — Ambulatory Visit: Payer: BC Managed Care – PPO | Admitting: Primary Care

## 2021-12-11 ENCOUNTER — Other Ambulatory Visit: Payer: Self-pay

## 2021-12-11 DIAGNOSIS — R633 Feeding difficulties, unspecified: Secondary | ICD-10-CM

## 2021-12-11 DIAGNOSIS — Z431 Encounter for attention to gastrostomy: Secondary | ICD-10-CM | POA: Diagnosis present

## 2021-12-11 HISTORY — PX: IR REPLACE G-TUBE SIMPLE WO FLUORO: IMG2323

## 2021-12-11 HISTORY — PX: IR REPLC GASTRO/COLONIC TUBE PERCUT W/FLUORO: IMG2333

## 2021-12-11 MED ORDER — LIDOCAINE VISCOUS HCL 2 % MT SOLN
OROMUCOSAL | Status: AC
  Administered 2021-12-11: 10 mL
  Filled 2021-12-11: qty 15

## 2021-12-11 MED ORDER — IOHEXOL 300 MG/ML  SOLN
100.0000 mL | Freq: Once | INTRAMUSCULAR | Status: AC | PRN
  Administered 2021-12-11: 25 mL

## 2021-12-11 NOTE — Procedures (Signed)
Exchange of existing Gastrostomy tube for  20 Fr  using fluoroscopy guidance. New tube positioned appropriately.  9 ml of normal saline instilled in the retention balloon port and the flange repositioned for comfort.     Ok to begin using gastrostomy   Please call IR for any questions or concerns regarding the g-tube.

## 2021-12-12 ENCOUNTER — Other Ambulatory Visit (HOSPITAL_COMMUNITY): Payer: Self-pay | Admitting: Primary Care

## 2021-12-12 ENCOUNTER — Encounter (HOSPITAL_COMMUNITY): Payer: Self-pay

## 2021-12-12 ENCOUNTER — Encounter: Payer: Self-pay | Admitting: Primary Care

## 2021-12-12 DIAGNOSIS — R633 Feeding difficulties, unspecified: Secondary | ICD-10-CM

## 2021-12-24 ENCOUNTER — Other Ambulatory Visit: Payer: Self-pay

## 2021-12-24 ENCOUNTER — Ambulatory Visit (INDEPENDENT_AMBULATORY_CARE_PROVIDER_SITE_OTHER): Payer: BC Managed Care – PPO | Admitting: Podiatry

## 2021-12-24 ENCOUNTER — Encounter: Payer: Self-pay | Admitting: Podiatry

## 2021-12-24 DIAGNOSIS — M79675 Pain in left toe(s): Secondary | ICD-10-CM | POA: Diagnosis not present

## 2021-12-24 DIAGNOSIS — B351 Tinea unguium: Secondary | ICD-10-CM | POA: Diagnosis not present

## 2021-12-24 DIAGNOSIS — N289 Disorder of kidney and ureter, unspecified: Secondary | ICD-10-CM

## 2021-12-24 DIAGNOSIS — E1169 Type 2 diabetes mellitus with other specified complication: Secondary | ICD-10-CM

## 2021-12-24 DIAGNOSIS — E669 Obesity, unspecified: Secondary | ICD-10-CM

## 2021-12-24 DIAGNOSIS — M79674 Pain in right toe(s): Secondary | ICD-10-CM

## 2021-12-24 NOTE — Progress Notes (Signed)
This patient presents  to my office for at risk foot care.  This patient requires this care by a professional since this patient will be at risk due to having diabetes,CVA and renal insufficiency.  This patient is unable to cut nails himself since the patient cannot reach his nails.These nails are painful walking and wearing shoes. He presents to the office with caregiver. This patient presents for at risk foot care today. ? ?General Appearance  Alert, conversant and in no acute stress. ? ?Vascular  Dorsalis pedis are weakly  palpable  bilaterally. Posterior tibial pulses are absent due to swelling  Bilateral.  Capillary return is within normal limits  bilaterally. Cold feet  bilaterally. ? ?Neurologic  Senn-Weinstein monofilament wire test diminished bilaterally. Muscle power within normal limits bilaterally. ? ?Nails Thick disfigured discolored nails with subungual debris  from hallux to fifth toes bilaterally. No evidence of bacterial infection or drainage bilaterally. ? ?Orthopedic  No limitations of motion  feet .  No crepitus or effusions noted.  No bony pathology or digital deformities noted. ? ?Skin  normotropic skin with no porokeratosis noted bilaterally.  No signs of infections or ulcers noted.    ? ?Onychomycosis  Pain in right toes  Pain in left toes ? ?Consent was obtained for treatment procedures.   Mechanical debridement of nails 1-5  bilaterally performed with a nail nipper.  Filed with dremel without incident.  ? ? ?Return office visit   3 months                  Told patient to return for periodic foot care and evaluation due to potential at risk complications. ? ? ?Gardiner Barefoot DPM  ?

## 2022-01-08 ENCOUNTER — Other Ambulatory Visit: Payer: Self-pay | Admitting: Primary Care

## 2022-01-08 ENCOUNTER — Other Ambulatory Visit: Payer: Self-pay | Admitting: Physical Medicine & Rehabilitation

## 2022-01-08 DIAGNOSIS — K5900 Constipation, unspecified: Secondary | ICD-10-CM

## 2022-01-08 DIAGNOSIS — E1142 Type 2 diabetes mellitus with diabetic polyneuropathy: Secondary | ICD-10-CM

## 2022-01-29 ENCOUNTER — Telehealth: Payer: Self-pay

## 2022-01-29 NOTE — Telephone Encounter (Signed)
Please send request to his nutritionist. ?

## 2022-01-29 NOTE — Telephone Encounter (Signed)
New message  Pt c/o medication issue:  1. Name of Medication: Nutritional Supplements (FEEDING SUPPLEMENT, OSMOLITE 1.5 CAL,) LIQD  2. How are you currently taking this medication (dosage and times per day)?   3. Are you having a reaction (difficulty breathing--STAT)? No   4. What is your medication issue? [facility] [phone] waiting on recertificaiton from [facility]

## 2022-01-30 ENCOUNTER — Other Ambulatory Visit: Payer: Self-pay | Admitting: Primary Care

## 2022-01-30 DIAGNOSIS — R6889 Other general symptoms and signs: Secondary | ICD-10-CM

## 2022-01-30 NOTE — Telephone Encounter (Signed)
Please call patient to find out the name of his nutritionist and have him send his nutritional supplement request there. ?

## 2022-02-02 NOTE — Telephone Encounter (Signed)
Left message to return call to our office.  

## 2022-02-03 NOTE — Telephone Encounter (Signed)
Left message to return call to our office.  

## 2022-02-03 NOTE — Telephone Encounter (Signed)
Pt wife returning your call. 

## 2022-02-04 NOTE — Telephone Encounter (Signed)
Have called [facility] they will reach out to location that has provided order in the past. If any further documentation or questions will let us know.  ?

## 2022-02-04 NOTE — Telephone Encounter (Signed)
Pt wife returning your call. Pt wife states that you can call her at [phone] ?

## 2022-02-04 NOTE — Telephone Encounter (Signed)
Called wife she states that she has spoke to [facility] and insurance and both say she need to get in touch with our office. I will call them to verify who has given order in the past.  ?

## 2022-02-06 ENCOUNTER — Ambulatory Visit (INDEPENDENT_AMBULATORY_CARE_PROVIDER_SITE_OTHER): Payer: BC Managed Care – PPO | Admitting: Family Medicine

## 2022-02-06 ENCOUNTER — Encounter: Payer: Self-pay | Admitting: Family Medicine

## 2022-02-06 VITALS — BP 128/64 | HR 78 | Temp 97.9°F | Ht 72.0 in | Wt 268.0 lb

## 2022-02-06 DIAGNOSIS — R600 Localized edema: Secondary | ICD-10-CM

## 2022-02-06 DIAGNOSIS — S81801A Unspecified open wound, right lower leg, initial encounter: Secondary | ICD-10-CM | POA: Diagnosis not present

## 2022-02-06 MED ORDER — AMOXICILLIN-POT CLAVULANATE 875-125 MG PO TABS: ORAL_TABLET | ORAL | 0 refills | Status: DC

## 2022-02-06 NOTE — Progress Notes (Signed)
? ?Subjective:  ? ? Patient ID: Michael Bright, male    DOB: 1956-05-16, 66 y.o.   MRN: [id number] ? ?HPI ?66 yo pt of [facility] presents for leg swelling and drainage  ? ?Wt Readings from Last 3 Encounters:  ?02/06/22 268 lb (121.6 kg)  ?10/24/21 256 lb (116.1 kg)  ?10/21/21 256 lb 8 oz (116.3 kg)  ? ?36.35 kg/m? ? ? ?H/o LE edema ?Had a blister on R leg  ?No trauma  ?Has not had this happen before  ?Not painful  ? ?Does not always wear support socks ?Needs to elevate feet  ? ?Controls sodium - only tube feeds  ? ?Lab Results  ?Component Value Date  ? CREATININE 0.59 09/04/2021  ? BUN 16 09/04/2021  ? NA 139 09/04/2021  ? K 4.3 09/04/2021  ? CL 101 09/04/2021  ? CO2 34 (H) 09/04/2021  ? ?Lab Results  ?Component Value Date  ? ALT 14 09/04/2021  ? AST 15 09/04/2021  ? ALKPHOS 68 09/04/2021  ? BILITOT 0.6 09/04/2021  ? ?Patient Active Problem List  ? Diagnosis Date Noted  ? Wound of right leg 02/08/2022  ? Preventative health care 09/04/2021  ? Cervicalgia 06/25/2021  ? Pain due to onychomycosis of toenails of both feet 03/25/2021  ? Gait disorder 12/18/2020  ? Paresthesias 08/23/2020  ? Constipation 02/21/2020  ? Lower extremity edema 11/24/2019  ? Difficulty urinating 11/24/2019  ? Dupuytren's contracture of left hand 09/20/2019  ? Hyperlipidemia 08/24/2019  ? Upper and lower extremity pain 08/24/2019  ? OSA on CPAP 08/09/2019  ? Copious oral secretions 08/09/2019  ? Anemia of chronic disease   ? Oropharyngeal dysphagia   ? Controlled diabetes mellitus type 2 with complications (HCC)   ? Morbid obesity (HCC)   ? Feeding by G-tube [facility])   ? History of CVA (cerebrovascular accident) 06/27/2019  ? Essential hypertension 06/14/2019  ? Diplopia   ? Chronic fatigue   ? ?Past Medical History:  ?Diagnosis Date  ? Acute CVA (cerebrovascular accident) (HCC) 05/30/2019  ? Acute kidney injury (HCC)   ? Acute respiratory failure (HCC)   ? Aspiration pneumonia (HCC) 06/14/2019  ? 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 Diabetes (HCC)    ? 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 (CVA) 08/09/2019  ? Pneumonia   ? aspiration pneumonia  ? Renal insufficiency 05/30/2019  ? Sepsis with acute renal failure without septic shock (HCC)   ? Septic shock (HCC)   ? Status post insertion of percutaneous endoscopic gastrostomy (PEG) tube (HCC)   ? Stroke [facility])   ? Urinary tract infection   ? Urinary tract infection without hematuria   ? ?Past Surgical History:  ?Procedure Laterality Date  ? CHOLECYSTECTOMY    ? IR GASTROSTOMY TUBE MOD SED  07/04/2019  ? IR REPLC GASTRO/COLONIC TUBE PERCUT W/FLUORO  12/11/2021  ? ?Social History  ? ?Tobacco Use  ? Smoking status: Never  ? Smokeless tobacco: Never  ?Vaping Use  ? Vaping Use: Never used  ?Substance Use Topics  ? Alcohol use: Yes  ? Drug use: No  ? ?Family History  ?Adopted: Yes  ? ?No Known Allergies ?Current Outpatient Medications on File Prior to Visit  ?Medication Sig Dispense Refill  ? Accu-Chek FastClix Lancets MISC USE AS DIRECTED UP TO FOUR TIMES DAILY 102 each 5  ? acetaminophen (TYLENOL) 325 MG tablet 325-650 mg by Per G Tube route as needed.    ? aspirin 81 MG chewable tablet 81 mg by Per  G Tube route daily.    ? atorvastatin (LIPITOR) 40 MG tablet TAKE 1 TABLET (40 MG TOTAL) BY MOUTH AT BEDTIME. FOR CHOLESTEROL. 90 tablet 3  ? chlorhexidine (PERIDEX) 0.12 % solution RINSE AND SPIT WITH 15 MLS BY MOUTH 2 (TWO) TIMES DAILY. 473 mL 1  ? finasteride (PROSCAR) 5 MG tablet PLACE ONE TABLET IN GI TUBE ONCE DAILY FOR URINE FLOW. 90 tablet 3  ? fluticasone (FLONASE) 50 MCG/ACT nasal spray PLACE 1 SPRAY INTO BOTH NOSTRILS 2 (TWO) TIMES DAILY AS NEEDED FOR ALLERGIES OR RHINITIS. 16 mL 2  ? gabapentin (NEURONTIN) 250 MG/5ML solution TAKE 2.5-5 MLS (125-250 MG TOTAL) BY MOUTH AT BEDTIME. FOR PAIN. 150 mL 6  ? glucose blood (ACCU-CHEK GUIDE) test strip USE AS DIRECTED UP TO FOUR TIMES DAILY 400 strip 3  ? glycopyrrolate (ROBINUL) 1 MG tablet PLACE 2 TABLETS (2 MG TOTAL) INTO FEEDING TUBE 3 (THREE)  TIMES DAILY. FOR ORAL SECRETIONS. 540 tablet 1  ? guaifenesin (ROBITUSSIN) 100 MG/5ML syrup PLACE 10 MLS (200 MG TOTAL) INTO FEEDING TUBE 4 (FOUR) TIMES DAILY - WITH MEALS AND AT BEDTIME. 948 mL 0  ? mouth rinse LIQD solution 15 mLs by Mouth Rinse route 5 (five) times daily. 946 mL 1  ? Nutritional Supplements (FEEDING SUPPLEMENT, OSMOLITE 1.5 CAL,) LIQD Place 325 mLs into feeding tube 4 (four) times daily. (Patient taking differently: Place 237 mLs into feeding tube 4 (four) times daily.)  0  ? Sennosides (SENNA) 8.8 MG/5ML SYRP 5 MLS BY FEEDING TUBE ROUTE 2 (TWO) TIMES DAILY AS NEEDED. 237 mL 1  ? sildenafil (VIAGRA) 50 MG tablet Take 1 tablet (50 mg total) by mouth daily as needed for erectile dysfunction. 5 tablet 1  ? Water For Irrigation, Sterile (FREE WATER) SOLN Place 200 mLs into feeding tube every 8 (eight) hours. Use filtered or bottled water (not distilled water)    ? ?No current facility-administered medications on file prior to visit.  ?  ? ?Review of Systems  ?Constitutional:  Negative for activity change, appetite change, fatigue, fever and unexpected weight change.  ?HENT:  Negative for congestion, rhinorrhea, sore throat and trouble swallowing.   ?Eyes:  Negative for pain, redness, itching and visual disturbance.  ?Respiratory:  Negative for cough, chest tightness, shortness of breath and wheezing.   ?Cardiovascular:  Negative for chest pain and palpitations.  ?Gastrointestinal:  Negative for abdominal pain, blood in stool, constipation, diarrhea and nausea.  ?Endocrine: Negative for cold intolerance, heat intolerance, polydipsia and polyuria.  ?Genitourinary:  Negative for difficulty urinating, dysuria, frequency and urgency.  ?Musculoskeletal:  Negative for arthralgias, joint swelling and myalgias.  ?Skin:  Positive for wound. Negative for pallor and rash.  ?Neurological:  Negative for dizziness, tremors, weakness, numbness and headaches.  ?Hematological:  Negative for adenopathy. Does not  bruise/bleed easily.  ?Psychiatric/Behavioral:  Negative for decreased concentration and dysphoric mood. The patient is not nervous/anxious.   ? ?   ?Objective:  ? Physical Exam ?Constitutional:   ?   General: He is not in acute distress. ?   Appearance: Normal appearance. He is obese. He is not ill-appearing.  ?Eyes:  ?   General: No scleral icterus. ?Cardiovascular:  ?   Rate and Rhythm: Normal rate and regular rhythm.  ?Pulmonary:  ?   Effort: Pulmonary effort is normal. No respiratory distress.  ?   Breath sounds: Normal breath sounds.  ?   Comments: No crackles ?Musculoskeletal:  ?   Cervical back: Neck supple.  ?  Right lower leg: Edema present.  ?   Left lower leg: Edema present.  ?   Comments: 1-2 plus pedal edema 1/2 way to knee  ?Lymphadenopathy:  ?   Cervical: No cervical adenopathy.  ?Skin: ?   General: Skin is warm and dry.  ?   Comments: 3 by 4 cm oval partially ruptured vesicle on R lower leg in setting of edema  ?Scant collar of erythema  ?Early granulation tissue in area of skin breakdown ?Scant clear drainage  ?  ?Neurological:  ?   Mental Status: He is alert.  ?Psychiatric:     ?   Mood and Affect: Mood normal.  ? ? ? ? ? ?   ?Assessment & Plan:  ? ?Problem List Items Addressed This Visit   ? ?  ? Other  ? Lower extremity edema - Primary  ?  Worse lately with a blister on R leg  ?Does not get a lot of sodium/on tube feeds  ? ?Strongly encouraged to wear support socks to the knee during the day and elevate feet when sitting  ? ? ?  ?  ? Wound of right leg  ?  Area of previous blister on R shin , partially ruptured with some granulation tissue and scant erythema  ? ?Px augmentin for early infection  ?Adv use of abx ointment and light covering (soap and water cleanse) ?Watch for inc size/redness or discomfort and f/u  ?Disc management of edema (prob cause of the blister) ? ?  ?  ? ? ?

## 2022-02-06 NOTE — Patient Instructions (Addendum)
Take the augmentin to cover skin infection as directed  ?Keep area clean with soap and water  ?Cover lightly  ? ?Use antibiotic ointment to prevent that bandage from sticking  ? ? ?Elevate feet when you sit  ?Compress with socks or a wrap if you cannot get it on  ? ?If not significantly improved next week please see Jae Dire for a re check  ? ? ? ? ?

## 2022-02-08 DIAGNOSIS — S81801A Unspecified open wound, right lower leg, initial encounter: Secondary | ICD-10-CM

## 2022-02-08 HISTORY — DX: Unspecified open wound, right lower leg, initial encounter: S81.801A

## 2022-02-08 NOTE — Assessment & Plan Note (Signed)
Worse lately with a blister on R leg  ?Does not get a lot of sodium/on tube feeds  ? ?Strongly encouraged to wear support socks to the knee during the day and elevate feet when sitting  ? ?

## 2022-02-08 NOTE — Assessment & Plan Note (Signed)
Area of previous blister on R shin , partially ruptured with some granulation tissue and scant erythema  ? ?Px augmentin for early infection  ?Adv use of abx ointment and light covering (soap and water cleanse) ?Watch for inc size/redness or discomfort and f/u  ?Disc management of edema (prob cause of the blister) ?

## 2022-02-20 ENCOUNTER — Telehealth: Payer: Self-pay

## 2022-02-20 DIAGNOSIS — Z931 Gastrostomy status: Secondary | ICD-10-CM

## 2022-02-20 DIAGNOSIS — R1312 Dysphagia, oropharyngeal phase: Secondary | ICD-10-CM

## 2022-02-20 NOTE — Telephone Encounter (Signed)
[facility] said he needs to be re-certified to get the feeding supplements. Is it okay to send an order for this? ? ?[phone] ?

## 2022-02-23 NOTE — Telephone Encounter (Signed)
Order faxed to [facility] ?

## 2022-02-26 ENCOUNTER — Other Ambulatory Visit: Payer: Self-pay | Admitting: Primary Care

## 2022-02-26 DIAGNOSIS — K5900 Constipation, unspecified: Secondary | ICD-10-CM

## 2022-03-30 ENCOUNTER — Ambulatory Visit (INDEPENDENT_AMBULATORY_CARE_PROVIDER_SITE_OTHER): Payer: BC Managed Care – PPO | Admitting: Podiatry

## 2022-03-30 ENCOUNTER — Encounter: Payer: Self-pay | Admitting: Podiatry

## 2022-03-30 DIAGNOSIS — E1169 Type 2 diabetes mellitus with other specified complication: Secondary | ICD-10-CM

## 2022-03-30 DIAGNOSIS — M79675 Pain in left toe(s): Secondary | ICD-10-CM | POA: Diagnosis not present

## 2022-03-30 DIAGNOSIS — N289 Disorder of kidney and ureter, unspecified: Secondary | ICD-10-CM

## 2022-03-30 DIAGNOSIS — B351 Tinea unguium: Secondary | ICD-10-CM

## 2022-03-30 DIAGNOSIS — E669 Obesity, unspecified: Secondary | ICD-10-CM

## 2022-03-30 DIAGNOSIS — M79674 Pain in right toe(s): Secondary | ICD-10-CM

## 2022-03-30 NOTE — Progress Notes (Signed)
This patient presents  to my office for at risk foot care.  This patient requires this care by a professional since this patient will be at risk due to having diabetes,CVA and renal insufficiency.  This patient is unable to cut nails himself since the patient cannot reach his nails.These nails are painful walking and wearing shoes. He presents to the office with caregiver. This patient presents for at risk foot care today.  General Appearance  Alert, conversant and in no acute stress.  Vascular  Dorsalis pedis are weakly  palpable  bilaterally. Posterior tibial pulses are absent due to swelling  Bilateral.  Capillary return is within normal limits  bilaterally. Cold feet  bilaterally.  Neurologic  Senn-Weinstein monofilament wire test diminished bilaterally. Muscle power within normal limits bilaterally.  Nails Thick disfigured discolored nails with subungual debris  from hallux to fifth toes bilaterally. No evidence of bacterial infection or drainage bilaterally.  Orthopedic  No limitations of motion  feet .  No crepitus or effusions noted.  No bony pathology or digital deformities noted.  Skin  normotropic skin with no porokeratosis noted bilaterally.  No signs of infections or ulcers noted.     Onychomycosis  Pain in right toes  Pain in left toes  Consent was obtained for treatment procedures.   Mechanical debridement of nails 1-5  bilaterally performed with a nail nipper.  Filed with dremel without incident. Discussed vascular studies in the future.   Return office visit   3 months                  Told patient to return for periodic foot care and evaluation due to potential at risk complications.   Helane Gunther DPM

## 2022-04-06 NOTE — Addendum Note (Signed)
Addended by: Silas Sacramento T on: 04/06/2022 12:03 PM   Modules accepted: Orders

## 2022-04-06 NOTE — Telephone Encounter (Signed)
[facility] needed more information for feeding supplement. Called wife to get more information and resent order.

## 2022-04-08 ENCOUNTER — Other Ambulatory Visit: Payer: Self-pay | Admitting: Primary Care

## 2022-04-08 DIAGNOSIS — Z8673 Personal history of transient ischemic attack (TIA), and cerebral infarction without residual deficits: Secondary | ICD-10-CM

## 2022-04-08 DIAGNOSIS — N319 Neuromuscular dysfunction of bladder, unspecified: Secondary | ICD-10-CM

## 2022-04-20 ENCOUNTER — Other Ambulatory Visit: Payer: Self-pay | Admitting: Physical Medicine & Rehabilitation

## 2022-04-27 ENCOUNTER — Ambulatory Visit (INDEPENDENT_AMBULATORY_CARE_PROVIDER_SITE_OTHER): Payer: BC Managed Care – PPO | Admitting: Adult Health

## 2022-04-27 ENCOUNTER — Encounter: Payer: Self-pay | Admitting: Adult Health

## 2022-04-27 VITALS — BP 127/67 | HR 68 | Ht 72.0 in | Wt 271.0 lb

## 2022-04-27 DIAGNOSIS — Z9989 Dependence on other enabling machines and devices: Secondary | ICD-10-CM | POA: Diagnosis not present

## 2022-04-27 DIAGNOSIS — G464 Cerebellar stroke syndrome: Secondary | ICD-10-CM | POA: Diagnosis not present

## 2022-04-27 DIAGNOSIS — G4733 Obstructive sleep apnea (adult) (pediatric): Secondary | ICD-10-CM

## 2022-04-27 NOTE — Patient Instructions (Signed)
No changes today.   Continue to closely follow with PCP for aggressive stroke risk factor management  Continue nightly use of CPAP for sleep apnea management. Continue to follow with your DME company for any needed supplies or CPAP related concerns    Follow-up in 1 year or call earlier if needed

## 2022-04-27 NOTE — Progress Notes (Signed)
[facility] [address] [place]. [place] [address] [phone]       OFFICE FOLLOW UP NOTE  [name] Date of Birth:  Sep 17, 1956 Medical Record Number:  [id number]    Reason for visit: Stroke and CPAP follow-up    CHIEF COMPLAINT:  Chief Complaint  Patient presents with   OSA on CPAP    Rm 3 with wife here for yearly f/u. Reports overall doing ok. Has been having issues with bilateral leg swelling. Taking with PCP on this.      HPI:   Update 04/27/2022 JM: Patient returns for yearly CPAP and stroke follow-up.    Stable from stroke standpoint, no new stroke/TIA symptoms.  Residual dysarthria, dysphagia with use of PEG tube and RUE numbness stable. Use of rollator walker, no recent falls.  Compliant on aspirin atorvastatin, denies side effects.  Routinely follows with PCP for stroke risk factor management. Does mention issues with BLE swelling and wounds currently being followed by PCP.    Doing well on CPAP.  Review of compliance report as below showing good usage and optimal residual AHI.  Tolerating CPAP well. Is up to date on supplies.  ESS 10/24.             History provided for reference purposes only Update 04/24/2021 JM: Michael Bright returns for 76-month stroke and CPAP follow-up accompanied by his wife  Stable from stroke standpoint without new stroke/TIA symptoms.  Residual dysphagia, dysarthria and RUE numbness stable.  Currently on full liquid diet and use of PEG tube for main nutrition and meds.  Continues to use rolling walker for ambulation-denies any recent falls.  Compliant on aspirin and atorvastatin without associated side effects.  Blood pressure today 120/69.  Recent A1c 5.4.  Continues to do well on CPAP.  He did recently receive a new mask but was sent to a size medium when he usually wears a large and has been having difficulty with leaks since then - they have no attempted to reach out to DME company yet to notify them.   He has no other CPAP related concerns.  Epworth Sleepiness Scale 6         Update 10/23/2020 JM: Michael Bright returns for initial CPAP compliance visit accompanied by his wife. He was previously seen on 08/15/2020 for stroke follow-up.   He reports doing well on CPAP tolerating without difficulty.  Reports improvement of his sleep as well as increased energy during the day.  He continues to follow with [facility] for any needed supplies or CPAP concerns.   Review of compliance report from 09/22/2020 -10/21/2020 shows 29 out of 30 usage days and 24 days greater than 4 hours for 80% compliance.  Average usage 5 hours and 30 minutes.  Residual AHI 3.2 with min pressure 80 and max pressure 16 with a EPR level 2.  Leaks in the 95th percentile 33.6.  Pressure in the 95th percentile 9.4.   Update 08/15/2020 JM: Michael Bright returns for stroke follow-up accompanied by his son.  Reports residual deficits of RUE numbness and dysarthria which has been improving and dysphagia with limited oral intake and primary use of G tube.  He was found to have esophageal strictures on EGD after SLP referred him to GI.  These were stretched on 8/16 but has not noticed much benefit.  He plans on undergoing barium swallow study in the near future and continues to follow closely with ENT and speech therapy. Denies new or worsening stroke/TIA  symptoms.  Remains on aspirin and atorvastatin for secondary stroke prevention without side effects.  Blood pressure today 157/88.  He does report bilateral lower extremity paresthesias likely diabetic neuropathy interfering with ambulation and sleep.  Currently on gabapentin 125-250mg  nightly without benefit.  Denies any side effects on current dose.  Establish care with [facility] sleep clinic [name] in 04/2020 undergoing HST confirming severe OSA and was provided with a new CPAP machine and supplies.  He received his CPAP machine approximately 2 weeks ago and reports using nightly tolerating well without  difficulty.  No further concerns at this time.  Update 04/07/2020 JM: Michael Bright is being seen for stroke follow-up accompanied by his wife.  Residual deficits of RUE altered sensation and dysphagia.  Continues to work with outpatient therapy with ongoing improvement.  Continues to receive primary nutrition via G-tube but is able to eat soft food and drink regular liquids without difficulty.  He continues to experience acid reflux type symptoms and apparently was not able to initiate pantoprazole as he reports insurance declined coverage despite PCP recently providing refill.  Continues on aspirin 81 mg daily and atorvastatin 40 mg daily.  Blood pressure today 136/78.  He requests establishing care with new sleep provider as he is due for new CPAP machine and does not have current provider.  No further concerns at this time.  Update 01/01/2020 JM: Michael Bright is a 66 year old male who is being seen today, 01/01/2020, for stroke follow-up accompanied by his wife.  He has been stable from a stroke standpoint with residual deficits of right hemiparesis, RUE paresthesias and dysphagia s/p PEG tube. Continues to work with outpatient therapy with ongoing improvement.  Continues to use Rollator walker at all times and denies any recent falls.  Ongoing use of PEG tube but has been advanced to NTL without difficulty.  He does endorse increased acid reflux symptoms over the past month which is also required increased suctioning.  He does report use of tomato juice during therapy.  Continues cyclic tube feeding and recently evaluated nutrition who changed tube feed formulation due to constipation and weight gain -has not yet started new feeding.  Continues on gabapentin 250 mg nightly for RUE pain but endorses ongoing constant warm sensation.  Completed 3 months DAPT and continues on aspirin alone without bleeding or bruising.  Continues on atorvastatin 40 mg daily without myalgias.  Recent A1c 5.7.  Continues to follow PCP for  HTN, HLD and DM management.  Blood pressure today 136/76. No further concerns at this time  Initial visit 09/11/2019 JM: Michael Bright is a 66 year old male who is being seen today for hospital follow-up accompanied by his wife.  He has been doing well since discharge with residual right-sided weakness, imbalance and dysphagia.  He does endorse ongoing improvement with home health therapies and plans on transitioning to outpatient therapies once completed.  He does endorse occasional sharp stabbing sensation with flexion of his left wrist and will resolve with extension.  Denies radiating pain in the wrist or fingers.  He will also experience intermittent shock type sensations on right upper and lower extremity.  He was started on gabapentin 250 mg at bedtime with little improvement.  He continues to use a rolling walker for ambulation as well as AFO brace and denies any recent falls.  Remains n.p.o. and receives all nutrition, hydration and medications through PEG tube.  He does endorse ongoing difficulty with excretions and does have suction device at home for assistance.  Improvement of neurogenic bladder and has been voiding independently.  PCP recently discontinued citalopram as he did not have residual depression/anxiety symptoms.  Depression/anxiety has been stable at this time.  He has continued on aspirin and Plavix without bleeding or bruising.  Continues on atorvastatin without myalgias.  Recent lipid panel obtained by PCP with improvement of LDL 55.  Blood pressure today satisfactory at 125/95.  Glucose levels greatly improved with recent A1c 7.3.  No further concerns at this time.  Stroke admission 05/30/2019: Michael Bright is a 66 y.o. male with hx DB who stopped meds 10 yrs ago and morbid obesity presenting to [facility] ED with dysarthria, left facial droop, double vision and gait unsteadiness since Sunday 8/9.  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  CTA head/neck negative LVO with diffuse intracranial stenosis moderate supraclinoid bilateral ICAs and mild stenosis left VA origin.  2D echo normal EF without cardiac source of embolus identified.  LDL 135.  A1c 10.6.  Recommended DAPT for 3 months then aspirin alone.  Blood pressure stable during admission without prior history of HTN.  Initiated atorvastatin 40 mg daily.  New diagnosis of DM and recommended close PCP follow-up.  Other stroke is factors include morbid obesity and EtOH but no prior history of stroke.  Hospital course complicated by hypoxemic respiratory failure secondary to medullary stroke intubated on 8/15 with trach placement on 8/21, possible aspiration pneumonia secondary to medullary stroke and dysphagia secondary to medullary stroke with placement of PEG tube on 9/15.  He was discharged to CIR on 06/27/2019 for ongoing therapy with residual deficits.  During admission to CIR, he had improvement of respiratory status and was decannulated on 9/22.  Difficulty with neurogenic bladder requiring intermittent caths and discharged with recommendation on following up with urology outpatient.  He was discharged home in stable condition with recommendation of home health therapy on 07/17/2019.  He unfortunately was readmitted on 07/20/2019 with septic shock due to Klebsiella pneumoniae bacteremia with UTI and hypoxic respiratory failure bilateral lung infiltrates due to rhinovirus.  Required intubation briefly and treated with broad-spectrum antibiotics eventually narrowed with recommendation of 2-week total antibiotic course.  Hospital course significant for AKI with abnormal LFTs due to shock liver, intermittent hypoxic episode as well as significant oral secretions with dysphonia.  He was discharged back to CIR for ongoing therapy due to debility.  Ongoing deficits of severe pharyngeal dysphagia and mild memory deficits affecting recall but showed improvement of activity tolerance,  balance and postural control as well as ability to compensate for deficits and was discharged home with home health therapy on 08/08/2019.    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   Diabetes (HCC)    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 (CVA) 08/09/2019   Pneumonia    aspiration pneumonia   Renal insufficiency 05/30/2019   Sepsis with acute renal failure without septic shock (HCC)    Septic shock (HCC)    Status post insertion of percutaneous endoscopic gastrostomy (PEG) tube (HCC)    Stroke (HCC)    Urinary tract infection    Urinary tract infection  without hematuria     PSH:  Past Surgical History:  Procedure Laterality Date   CHOLECYSTECTOMY     IR GASTROSTOMY TUBE MOD SED  07/04/2019   IR REPLC GASTRO/COLONIC TUBE PERCUT W/FLUORO  12/11/2021    Social History:  Social History   Socioeconomic History   Marital status: Married    Spouse name: Not on file   Number of children: Not on file   Years of education: Not on file   Highest education level: Not on file  Occupational History   Not on file  Tobacco Use   Smoking status: Never   Smokeless tobacco: Never  Vaping Use   Vaping Use: Never used  Substance and Sexual Activity   Alcohol use: Yes   Drug use: No   Sexual activity: Yes  Other Topics Concern   Not on file  Social History Narrative   Not on file   Social Determinants of Health   Financial Resource Strain: Not on file  Food Insecurity: Not on file  Transportation Needs: Not on file  Physical Activity: Not on file  Stress: Not on file  Social Connections: Not on file  Intimate Partner Violence: Not on file    Family  History:  Family History  Adopted: Yes    Medications:   Current Outpatient Medications on File Prior to Visit  Medication Sig Dispense Refill   Accu-Chek FastClix Lancets MISC USE AS DIRECTED UP TO FOUR TIMES DAILY 102 each 5   acetaminophen (TYLENOL) 325 MG tablet 325-650 mg by Per G Tube route as needed.     aspirin 81 MG chewable tablet 81 mg by Per G Tube route daily.     atorvastatin (LIPITOR) 40 MG tablet TAKE 1 TABLET (40 MG TOTAL) BY MOUTH AT BEDTIME. FOR CHOLESTEROL. 90 tablet 3   chlorhexidine (PERIDEX) 0.12 % solution RINSE AND SPIT WITH 15 MLS BY MOUTH 2 (TWO) TIMES DAILY. 473 mL 6   finasteride (PROSCAR) 5 MG tablet PLACE ONE TABLET IN GI TUBE ONCE DAILY FOR URINE FLOW. 90 tablet 1   fluticasone (FLONASE) 50 MCG/ACT nasal spray PLACE 1 SPRAY INTO BOTH NOSTRILS 2 (TWO) TIMES DAILY AS NEEDED FOR ALLERGIES OR RHINITIS. 16 mL 2   gabapentin (NEURONTIN) 250 MG/5ML solution TAKE 2.5-5 MLS (125-250 MG TOTAL) BY MOUTH AT BEDTIME. FOR PAIN. 150 mL 6   glucose blood (ACCU-CHEK GUIDE) test strip USE AS DIRECTED UP TO FOUR TIMES DAILY 400 strip 3   glycopyrrolate (ROBINUL) 1 MG tablet PLACE 2 TABLETS (2 MG TOTAL) INTO FEEDING TUBE 3 (THREE) TIMES DAILY. FOR ORAL SECRETIONS. 540 tablet 1   guaifenesin (ROBITUSSIN) 100 MG/5ML syrup PLACE 10 MLS (200 MG TOTAL) INTO FEEDING TUBE 4 (FOUR) TIMES DAILY - WITH MEALS AND AT BEDTIME. 948 mL 0   mouth rinse LIQD solution 15 mLs by Mouth Rinse route 5 (five) times daily. 946 mL 1   Nutritional Supplements (FEEDING SUPPLEMENT, OSMOLITE 1.5 CAL,) LIQD Place 325 mLs into feeding tube 4 (four) times daily. (Patient taking differently: Place 237 mLs into feeding tube 4 (four) times daily.)  0   Sennosides (SENNA) 8.8 MG/5ML SYRP 5 mLs (8.8 mg total) by Feeding Tube route 2 (two) times daily as needed. For constipation 900 mL 1   sildenafil (VIAGRA) 50 MG tablet Take 1 tablet (50 mg total) by mouth daily as needed for erectile dysfunction. 5 tablet 1   Water  For Irrigation, Sterile (FREE WATER) SOLN Place 200 mLs into feeding tube every 8 (  eight) hours. Use filtered or bottled water (not distilled water)     No current facility-administered medications on file prior to visit.    Allergies:  No Known Allergies   Physical Exam  Vitals:   04/27/22 1548  BP: 127/67  Pulse: 68  Weight: 271 lb (122.9 kg)  Height: 6' (1.829 m)     Body mass index is 36.75 kg/m. No results found.  General: Obese very pleasant middle-age African-American male, seated, in no evident distress Head: head normocephalic and atraumatic.   Neck: supple with no carotid or supraclavicular bruits Cardiovascular: regular rate and rhythm, no murmurs; +3 pitting edema BLE Musculoskeletal: no deformity Skin:  no rash/petichiae; PEG tube intact Vascular:  Normal pulses all extremities   Neurologic Exam Mental Status: Awake and fully alert. Mild dysarthria. Oriented to place and time. Recent and remote memory intact. Attention span, concentration and fund of knowledge appropriate. Mood and affect appropriate.  Cranial Nerves: Pupils equal, briskly reactive to light. Extraocular movements full without nystagmus. Visual fields full to confrontation. Hearing intact. Facial sensation intact.  Slight right lower facial weakness Motor: Normal bulk and tone.  Full strength in all tested extremities. Sensory.:  Diminished light touch sensation RUE proximally and decreased sensory BLE distally Coordination: Rapid alternating movements normal in all extremities. Finger-to-nose and heel-to-shin performed accurately bilaterally. Gait and Station: Arises from chair with mild difficulty. Stance is normal. Gait demonstrates  broad-based gait with slightly decreased stride length and step height and mild imbalance with use of Rollator walker Reflexes: 1+ and symmetric. Toes downgoing.       ASSESSMENT/PLAN: Michael Bright is a 66 y.o. 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 Vascular risk factors include severe OSA on CPAP, HTN, HLD, DM, EtOH use and morbid obesity.  Prolonged hospital course due to complications including hypoxic respiratory failure with placement of tracheostomy now removed, PEG tube due to dysphagia post stroke, neurogenic bladder post stroke resolved and aspiration pneumonia.  He returned shortly after CIR discharge due to septic shock requiring additional inpatient rehab stay.       Left lateral medullary stroke: Remains stable from stroke standpoint.  Continue aspirin and atorvastatin 40 mg daily for secondary stroke prevention.  Continue to follow closely with PCP for aggressive stroke risk factor management including HTN with BP goal<130/90, HLD with LDL<70 and DM with A1c goal<7  OSA on CPAP Compliance report shows satisfactory usage with optimal residual AHI.  Discussed continued nightly usage with ensuring greater than 4 hours nightly for optimal benefit and per insurance purposes.  Continue to follow with DME company for any needed supplies or CPAP related concerns      Follow-up in 1 year or call earlier if needed   CC: Doreene Nestlark, Katherine K, NP     I spent 33 minutes of face-to-face and non-face-to-face time with patient and wife.  This included previsit chart review, lab review, study review, order entry, electronic health record documentation, patient and wife education and discussion regarding review of CPAP compliance report and importance of ongoing usage, history of prior stroke with residual deficits as well as secondary stroke prevention measures and aggressive stroke risk factor management and answered all other questions to patient and wife satisfaction   Ihor AustinJessica McCue, AGNP-BC  [facility] [address] [place], [place] [address]  Phone [phone] Fax [phone] Note: This document was prepared with digital dictation and possible smart phrase technology. Any transcriptional errors that result from this process are unintentional.  I reviewed the above note and documentation by the Nurse Practitioner and agree with the history, exam, assessment and plan as outlined above. I was available for consultation. For general neurology, patient will follow with Dr. Pearlean Brownie and Ihor Austin Huston Foley, MD, PhD [facility])

## 2022-06-24 ENCOUNTER — Encounter
Payer: BC Managed Care – PPO | Attending: Physical Medicine & Rehabilitation | Admitting: Physical Medicine & Rehabilitation

## 2022-06-24 ENCOUNTER — Ambulatory Visit (INDEPENDENT_AMBULATORY_CARE_PROVIDER_SITE_OTHER): Payer: BC Managed Care – PPO | Admitting: Podiatry

## 2022-06-24 ENCOUNTER — Encounter: Payer: Self-pay | Admitting: Podiatry

## 2022-06-24 ENCOUNTER — Encounter: Payer: Self-pay | Admitting: Physical Medicine & Rehabilitation

## 2022-06-24 VITALS — BP 134/74 | HR 85 | Ht 72.0 in | Wt 267.8 lb

## 2022-06-24 DIAGNOSIS — M79675 Pain in left toe(s): Secondary | ICD-10-CM

## 2022-06-24 DIAGNOSIS — B351 Tinea unguium: Secondary | ICD-10-CM | POA: Diagnosis not present

## 2022-06-24 DIAGNOSIS — M542 Cervicalgia: Secondary | ICD-10-CM

## 2022-06-24 DIAGNOSIS — M79674 Pain in right toe(s): Secondary | ICD-10-CM | POA: Diagnosis not present

## 2022-06-24 DIAGNOSIS — E1169 Type 2 diabetes mellitus with other specified complication: Secondary | ICD-10-CM | POA: Diagnosis not present

## 2022-06-24 DIAGNOSIS — G464 Cerebellar stroke syndrome: Secondary | ICD-10-CM | POA: Diagnosis not present

## 2022-06-24 DIAGNOSIS — M720 Palmar fascial fibromatosis [Dupuytren]: Secondary | ICD-10-CM

## 2022-06-24 DIAGNOSIS — Z8673 Personal history of transient ischemic attack (TIA), and cerebral infarction without residual deficits: Secondary | ICD-10-CM | POA: Insufficient documentation

## 2022-06-24 DIAGNOSIS — M7712 Lateral epicondylitis, left elbow: Secondary | ICD-10-CM

## 2022-06-24 DIAGNOSIS — I69393 Ataxia following cerebral infarction: Secondary | ICD-10-CM

## 2022-06-24 DIAGNOSIS — W450XXA Nail entering through skin, initial encounter: Secondary | ICD-10-CM | POA: Diagnosis not present

## 2022-06-24 DIAGNOSIS — I69391 Dysphagia following cerebral infarction: Secondary | ICD-10-CM | POA: Diagnosis present

## 2022-06-24 DIAGNOSIS — N289 Disorder of kidney and ureter, unspecified: Secondary | ICD-10-CM

## 2022-06-24 DIAGNOSIS — N529 Male erectile dysfunction, unspecified: Secondary | ICD-10-CM

## 2022-06-24 HISTORY — DX: Dysphagia following cerebral infarction: I69.391

## 2022-06-24 HISTORY — DX: Nail entering through skin, initial encounter: W45.0XXA

## 2022-06-24 NOTE — Progress Notes (Signed)
Subjective:    Patient ID: Michael Bright, male    DOB: January 25, 1956, 66 y.o.   MRN: [id number]  HPI  Michael Bright is here in follow up of his left lateral medullary infarct. He is complaining of left elbow pain which has been going on for a few months. He has placed icey hot on this with some relief.   He admits to being lazy this summer.   He remains on a full liquid diet. He loves to eat ice cream. He also eats potato chips that are softened.   The brakes on his rolling walker are no longer working. He has them duct taped together.   His bowels and bladder are working well. His sleep is regular. His mood has been reasonable. l  Pain Inventory Average Pain 5 Pain Right Now 5 My pain is dull  In the last 24 hours, has pain interfered with the following? General activity 4 Relation with others 4 Enjoyment of life 4 What TIME of day is your pain at its worst? morning  Sleep (in general) Fair  Pain is worse with: unsure Pain improves with: medication Relief from Meds: 5  Family History  Adopted: Yes   Social History   Socioeconomic History   Marital status: Married    Spouse name: Not on file   Number of children: Not on file   Years of education: Not on file   Highest education level: Not on file  Occupational History   Not on file  Tobacco Use   Smoking status: Never   Smokeless tobacco: Never  Vaping Use   Vaping Use: Never used  Substance and Sexual Activity   Alcohol use: Yes   Drug use: No   Sexual activity: Yes  Other Topics Concern   Not on file  Social History Narrative   Not on file   Social Determinants of Health   Financial Resource Strain: Not on file  Food Insecurity: Not on file  Transportation Needs: Not on file  Physical Activity: Not on file  Stress: Not on file  Social Connections: Not on file   Past Surgical History:  Procedure Laterality Date   CHOLECYSTECTOMY     IR GASTROSTOMY TUBE MOD SED  07/04/2019   IR REPLC  GASTRO/COLONIC TUBE PERCUT W/FLUORO  12/11/2021   Past Surgical History:  Procedure Laterality Date   CHOLECYSTECTOMY     IR GASTROSTOMY TUBE MOD SED  07/04/2019   IR REPLC GASTRO/COLONIC TUBE PERCUT W/FLUORO  12/11/2021   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   Diabetes (HCC)    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 (CVA) 08/09/2019   Pneumonia    aspiration pneumonia   Renal insufficiency 05/30/2019   Sepsis with acute renal failure without septic shock (HCC)    Septic shock (HCC)    Status post insertion of percutaneous endoscopic gastrostomy (PEG) tube (HCC)    Stroke (HCC)    Urinary tract infection    Urinary tract infection without hematuria    BP 134/74   Pulse 85   Ht 6' (1.829 m)   Wt 267 lb 12.8 oz (121.5 kg)   SpO2 95%   BMI 36.32 kg/m   Opioid Risk Score:   Fall Risk Score:  `1  Depression screen [facility] 2/9  12/18/2020    1:11 PM 06/19/2020    1:29 PM 09/12/2019    5:21 PM 08/22/2019   10:31 AM  Depression screen PHQ 2/9  Decreased Interest 0 0 0 0  Down, Depressed, Hopeless 0 0 0 0  PHQ - 2 Score 0 0 0 0  Altered sleeping   0 0  Tired, decreased energy   3 3  Change in appetite   0 0  Feeling bad or failure about yourself    0 0  Trouble concentrating   0 0  Moving slowly or fidgety/restless   0 0  Suicidal thoughts   0 0  PHQ-9 Score   3 3  Difficult doing work/chores    Not difficult at all     Review of Systems  Constitutional: Negative.   HENT: Negative.    Eyes: Negative.   Respiratory: Negative.    Cardiovascular: Negative.   Gastrointestinal: Negative.   Endocrine: Negative.   Genitourinary: Negative.   Musculoskeletal:  Positive for gait problem.  Skin: Negative.   Allergic/Immunologic: Negative.   Hematological:  Negative.   Psychiatric/Behavioral: Negative.        Objective:   Physical Exam  General: No acute distress HEENT: NCAT, EOMI, oral membranes moist Cards: reg rate  Chest: normal effort Abdomen: Soft, NT, ND Skin: dry, intact Extremities: no edema Psych: pleasant and appropriate   Neuro: SPEECH dysartrhic but intelligible. Right sided sensory loss present arm/leg. . strength nearly 5/5 in all 4's. ambulates nicely with RW and did surprisingly well without device.  Musculoskeletal: left extensor tendon tender at lateral epicondyle, worsened with resistance of extensor indicis.  Assessment & Plan:  1.Hx of left lateral medullary infarct 2020 with balance deficits and severe oropharyngeal dysphagia  -NEEDS walker repairs. Need to hear from walker company         -Encouraged him to maintain a level of exercise and activity        -full liquid diet essentially.         -Area around the PEG still has hypergranulation tissue.    -loosened PEG   -needs gauze between bumper and abdomen   -consider silver nitrate 2 Mild cervicalgia:  seems generally improved 3 Pain: maintain gabapentin for neuropathic pain.   4. Left lateral epicondylitis  -forearm neoprene band  -tyelnol and ibuprofen prn  -ice              5.  Mild Dupuytren's contractures of the left third and fourth fingers             - improved 6. Erectile dysfunction        -Viagra as needed   20 Minutes of face to face patient care time were spent during this visit. All questions were encouraged and answered.  Follow up with me in 12 mos .

## 2022-06-24 NOTE — Progress Notes (Signed)
This patient presents  to my office for at risk foot care.  This patient requires this care by a professional since this patient will be at risk due to having diabetes,CVA and renal insufficiency.  This patient is unable to cut nails himself since the patient cannot reach his nails.These nails are painful walking and wearing shoes. He presents to the office with caregiver. This patient presents for at risk foot care today.  General Appearance  Alert, conversant and in no acute stress.  Vascular  Dorsalis pedis are weakly  palpable  bilaterally. Posterior tibial pulses are absent due to swelling  Bilateral.  Capillary return is within normal limits  bilaterally. Cold feet  bilaterally.  Neurologic  Senn-Weinstein monofilament wire test diminished bilaterally. Muscle power within normal limits bilaterally.  Nails Thick disfigured discolored nails with subungual debris  from hallux to fifth toes bilaterally. No evidence of bacterial infection or drainage bilaterally. Nail plate right hallux is unattached with masceration noted subungually.  Orthopedic  No limitations of motion  feet .  No crepitus or effusions noted.  No bony pathology or digital deformities noted.  Skin  normotropic skin with no porokeratosis noted bilaterally.  No signs of infections or ulcers noted.     Onychomycosis  Pain in right toes  Pain in left toes  Injury right hallux nail plate.  Consent was obtained for treatment procedures.   Mechanical debridement of nails 1-5  bilaterally performed with a nail nipper.  Filed with dremel without incident. During the course of nail treatment his great toenail right foot was found unattached with drainage noted subungually. His nail plate was completely removed.  He does admit to injuring this nail plate previously.  Silvadene/DSD.  Told to perform  peroxide wash at home until complete healing is noted.  Call if problems occur.   Return office visit   3 months                  Told  patient to return for periodic foot care and evaluation due to potential at risk complications.   Helane Gunther DPM

## 2022-06-24 NOTE — Patient Instructions (Addendum)
FOR YOUR ELBOW: ICE 3 X DAILY FOREARM NEOPRENE SLEEVE OR BAND (4-5" WIDE) TYLENOL IBUPROFEN---UP TO 200MG  3 X DAILY AS NEEDED--DO NOT USE IT LONG TERM THOUGH  IF DOESN'T GET BETTER , LET ME KNOW

## 2022-07-08 ENCOUNTER — Other Ambulatory Visit: Payer: Self-pay | Admitting: Primary Care

## 2022-07-08 DIAGNOSIS — R6889 Other general symptoms and signs: Secondary | ICD-10-CM

## 2022-07-09 ENCOUNTER — Telehealth: Payer: Self-pay

## 2022-07-09 NOTE — Telephone Encounter (Signed)
Called pt, no answer, left vm to call back 

## 2022-07-09 NOTE — Telephone Encounter (Signed)
Patient is due for CPE/follow up in November 2023, this will be required prior to any further refills.  Please schedule.

## 2022-07-09 NOTE — Telephone Encounter (Signed)
Kim, dietician called and stated she received the orders for the tube feeding, but the previous order was not enough formula. The order needs to be written over for more formula

## 2022-07-13 NOTE — Telephone Encounter (Signed)
Left voicemail to return call to clinic to specify the volume amount for the patient for Dr. Naaman Plummer to write the order

## 2022-07-14 NOTE — Telephone Encounter (Signed)
Kim left a voicemail stating no further information is needed

## 2022-08-06 ENCOUNTER — Telehealth: Payer: Self-pay | Admitting: Primary Care

## 2022-08-06 NOTE — Telephone Encounter (Signed)
Left message for patient to call back and schedule Medicare Annual Wellness Visit (AWV) either virtually or phone   Left  my jabber number [phone]    awvi 10/19/09 per [facility]     45 min for awv-i and in office appointments 30 min for awv-s  phone/virtual appointments   

## 2022-08-12 ENCOUNTER — Ambulatory Visit (INDEPENDENT_AMBULATORY_CARE_PROVIDER_SITE_OTHER): Payer: BC Managed Care – PPO

## 2022-08-12 ENCOUNTER — Telehealth: Payer: Self-pay

## 2022-08-12 ENCOUNTER — Ambulatory Visit: Payer: BC Managed Care – PPO

## 2022-08-12 VITALS — Ht 72.0 in | Wt 267.0 lb

## 2022-08-12 DIAGNOSIS — Z0000 Encounter for general adult medical examination without abnormal findings: Secondary | ICD-10-CM | POA: Diagnosis not present

## 2022-08-12 NOTE — Patient Instructions (Addendum)
Michael Bright , Thank you for taking time to come for your Medicare Wellness Visit. I appreciate your ongoing commitment to your health goals. Please review the following plan we discussed and let me know if I can assist you in the future.   These are the goals we discussed:  Goals       Stay alive (pt-stated)        This is a list of the screening recommended for you and due dates:  Health Maintenance  Topic Date Due   Hemoglobin A1C  03/04/2022   Yearly kidney function blood test for diabetes  09/04/2022   Yearly kidney health urinalysis for diabetes  09/04/2022   COVID-19 Vaccine (5 - Pfizer series) 08/28/2022*   Zoster (Shingles) Vaccine (2 of 2) 11/12/2022*   Flu Shot  01/17/2023*   Pneumonia Vaccine (2 - PCV) 08/13/2023*   Tetanus Vaccine  08/13/2023*   Eye exam for diabetics  12/09/2022   Complete foot exam   03/31/2023   Medicare Annual Wellness Visit  09/12/2023   Colon Cancer Screening  10/24/2026   Hepatitis C Screening: USPSTF Recommendation to screen - Ages 18-79 yo.  Completed   HIV Screening  Completed   HPV Vaccine  Aged Out  *Topic was postponed. The date shown is not the original due date.    Advanced directives: Advance directive discussed with you today. Even though you declined this today, please call our office should you change your mind, and we can give you the proper paperwork for you to fill out.   Conditions/risks identified: None  Next appointment: Follow up in one year for your annual wellness visit.    Preventive Care 32 Years and Older, Male  Preventive care refers to lifestyle choices and visits with your health care provider that can promote health and wellness. What does preventive care include? A yearly physical exam. This is also called an annual well check. Dental exams once or twice a year. Routine eye exams. Ask your health care provider how often you should have your eyes checked. Personal lifestyle choices, including: Daily care of  your teeth and gums. Regular physical activity. Eating a healthy diet. Avoiding tobacco and drug use. Limiting alcohol use. Practicing safe sex. Taking low doses of aspirin every day. Taking vitamin and mineral supplements as recommended by your health care provider. What happens during an annual well check? The services and screenings done by your health care provider during your annual well check will depend on your age, overall health, lifestyle risk factors, and family history of disease. Counseling  Your health care provider may ask you questions about your: Alcohol use. Tobacco use. Drug use. Emotional well-being. Home and relationship well-being. Sexual activity. Eating habits. History of falls. Memory and ability to understand (cognition). Work and work Astronomer. Screening  You may have the following tests or measurements: Height, weight, and BMI. Blood pressure. Lipid and cholesterol levels. These may be checked every 5 years, or more frequently if you are over 63 years old. Skin check. Lung cancer screening. You may have this screening every year starting at age 70 if you have a 30-pack-year history of smoking and currently smoke or have quit within the past 15 years. Fecal occult blood test (FOBT) of the stool. You may have this test every year starting at age 49. Flexible sigmoidoscopy or colonoscopy. You may have a sigmoidoscopy every 5 years or a colonoscopy every 10 years starting at age 38. Prostate cancer screening. Recommendations will vary depending  on your family history and other risks. Hepatitis C blood test. Hepatitis B blood test. Sexually transmitted disease (STD) testing. Diabetes screening. This is done by checking your blood sugar (glucose) after you have not eaten for a while (fasting). You may have this done every 1-3 years. Abdominal aortic aneurysm (AAA) screening. You may need this if you are a current or former smoker. Osteoporosis. You may be  screened starting at age 32 if you are at high risk. Talk with your health care provider about your test results, treatment options, and if necessary, the need for more tests. Vaccines  Your health care provider may recommend certain vaccines, such as: Influenza vaccine. This is recommended every year. Tetanus, diphtheria, and acellular pertussis (Tdap, Td) vaccine. You may need a Td booster every 10 years. Zoster vaccine. You may need this after age 60. Pneumococcal 13-valent conjugate (PCV13) vaccine. One dose is recommended after age 92. Pneumococcal polysaccharide (PPSV23) vaccine. One dose is recommended after age 71. Talk to your health care provider about which screenings and vaccines you need and how often you need them. This information is not intended to replace advice given to you by your health care provider. Make sure you discuss any questions you have with your health care provider. Document Released: 11/01/2015 Document Revised: 06/24/2016 Document Reviewed: 08/06/2015 Elsevier Interactive Patient Education  2017 [employer] [employer] Prevention in the Home Falls can cause injuries. They can happen to people of all ages. There are many things you can do to make your home safe and to help prevent falls. What can I do on the outside of my home? Regularly fix the edges of walkways and driveways and fix any cracks. Remove anything that might make you trip as you walk through a door, such as a raised step or threshold. Trim any bushes or trees on the path to your home. Use bright outdoor lighting. Clear any walking paths of anything that might make someone trip, such as rocks or tools. Regularly check to see if handrails are loose or broken. Make sure that both sides of any steps have handrails. Any raised decks and porches should have guardrails on the edges. Have any leaves, snow, or ice cleared regularly. Use sand or salt on walking paths during winter. Clean up any spills in  your garage right away. This includes oil or grease spills. What can I do in the bathroom? Use night lights. Install grab bars by the toilet and in the tub and shower. Do not use towel bars as grab bars. Use non-skid mats or decals in the tub or shower. If you need to sit down in the shower, use a plastic, non-slip stool. Keep the floor dry. Clean up any water that spills on the floor as soon as it happens. Remove soap buildup in the tub or shower regularly. Attach bath mats securely with double-sided non-slip rug tape. Do not have throw rugs and other things on the floor that can make you trip. What can I do in the bedroom? Use night lights. Make sure that you have a light by your bed that is easy to reach. Do not use any sheets or blankets that are too big for your bed. They should not hang down onto the floor. Have a firm chair that has side arms. You can use this for support while you get dressed. Do not have throw rugs and other things on the floor that can make you trip. What can I do in the kitchen?  Clean up any spills right away. Avoid walking on wet floors. Keep items that you use a lot in easy-to-reach places. If you need to reach something above you, use a strong step stool that has a grab bar. Keep electrical cords out of the way. Do not use floor polish or wax that makes floors slippery. If you must use wax, use non-skid floor wax. Do not have throw rugs and other things on the floor that can make you trip. What can I do with my stairs? Do not leave any items on the stairs. Make sure that there are handrails on both sides of the stairs and use them. Fix handrails that are broken or loose. Make sure that handrails are as long as the stairways. Check any carpeting to make sure that it is firmly attached to the stairs. Fix any carpet that is loose or worn. Avoid having throw rugs at the top or bottom of the stairs. If you do have throw rugs, attach them to the floor with carpet  tape. Make sure that you have a Lesch switch at the top of the stairs and the bottom of the stairs. If you do not have them, ask someone to add them for you. What else can I do to help prevent falls? Wear shoes that: Do not have high heels. Have rubber bottoms. Are comfortable and fit you well. Are closed at the toe. Do not wear sandals. If you use a stepladder: Make sure that it is fully opened. Do not climb a closed stepladder. Make sure that both sides of the stepladder are locked into place. Ask someone to hold it for you, if possible. Clearly mark and make sure that you can see: Any grab bars or handrails. First and last steps. Where the edge of each step is. Use tools that help you move around (mobility aids) if they are needed. These include: Canes. Walkers. Scooters. Crutches. Turn on the lights when you go into a dark area. Replace any Dennen bulbs as soon as they burn out. Set up your furniture so you have a clear path. Avoid moving your furniture around. If any of your floors are uneven, fix them. If there are any pets around you, be aware of where they are. Review your medicines with your doctor. Some medicines can make you feel dizzy. This can increase your chance of falling. Ask your doctor what other things that you can do to help prevent falls. This information is not intended to replace advice given to you by your health care provider. Make sure you discuss any questions you have with your health care provider. Document Released: 08/01/2009 Document Revised: 03/12/2016 Document Reviewed: 11/09/2014 Elsevier Interactive Patient Education  2017 [employer].

## 2022-08-12 NOTE — Telephone Encounter (Signed)
Unsuccessful attempt to reach patient on preferred number listed in notes for scheduled AWV. Unable to leave a voicemail.

## 2022-08-12 NOTE — Progress Notes (Signed)
Subjective:   Michael Bright is a 66 y.o. male who presents for Medicare Annual/Subsequent preventive examination.  Review of Systems    Virtual Visit via Telephone Note  I connected with  Michael Bright on 08/12/22 at  9:30 AM EDT by telephone and verified that I am speaking with the correct person using two identifiers.  Location: Patient: Home Provider: Office Persons participating in the virtual visit: patient/Nurse Health Advisor   I discussed the limitations, risks, security and privacy concerns of performing an evaluation and management service by telephone and the availability of in person appointments. The patient expressed understanding and agreed to proceed.  Interactive audio and video telecommunications were attempted between this nurse and patient, however failed, due to patient having technical difficulties OR patient did not have access to video capability.  We continued and completed visit with audio only.  Some vital signs may be absent or patient reported.   Criselda Peaches, LPN  Cardiac Risk Factors include: advanced age (>19men, >43 women);diabetes mellitus;hypertension;male gender     Objective:    Today's Vitals   08/12/22 1248  Weight: 267 lb (121.1 kg)  Height: 6' (1.829 m)   Body mass index is 36.21 kg/m.     08/12/2022   12:58 PM 06/24/2022   10:19 AM 10/26/2019    2:52 PM 07/26/2019    7:39 PM 07/19/2019   10:07 PM 06/27/2019    5:07 PM 05/31/2019   12:00 AM  Advanced Directives  Does Patient Have a Medical Advance Directive? No No No No No No No  Would patient like information on creating a medical advance directive? No - Patient declined   No - Patient declined Yes (ED - Information included in AVS) Yes (ED - Information included in AVS) No - Patient declined    Current Medications (verified) Outpatient Encounter Medications as of 08/12/2022  Medication Sig   Accu-Chek FastClix Lancets MISC USE AS DIRECTED UP TO FOUR TIMES DAILY    acetaminophen (TYLENOL) 325 MG tablet 325-650 mg by Per G Tube route as needed.   aspirin 81 MG chewable tablet 81 mg by Per G Tube route daily.   atorvastatin (LIPITOR) 40 MG tablet TAKE 1 TABLET (40 MG TOTAL) BY MOUTH AT BEDTIME. FOR CHOLESTEROL.   chlorhexidine (PERIDEX) 0.12 % solution RINSE AND SPIT WITH 15 MLS BY MOUTH 2 (TWO) TIMES DAILY.   finasteride (PROSCAR) 5 MG tablet PLACE ONE TABLET IN GI TUBE ONCE DAILY FOR URINE FLOW.   fluticasone (FLONASE) 50 MCG/ACT nasal spray PLACE 1 SPRAY INTO BOTH NOSTRILS 2 (TWO) TIMES DAILY AS NEEDED FOR ALLERGIES OR RHINITIS.   gabapentin (NEURONTIN) 250 MG/5ML solution TAKE 2.5-5 MLS (125-250 MG TOTAL) BY MOUTH AT BEDTIME. FOR PAIN.   glucose blood (ACCU-CHEK GUIDE) test strip USE AS DIRECTED UP TO FOUR TIMES DAILY   glycopyrrolate (ROBINUL) 1 MG tablet Place 2 tablets (2 mg total) into feeding tube 3 (three) times daily. For oral secretions. Office visit required for further refills.   guaifenesin (ROBITUSSIN) 100 MG/5ML syrup PLACE 10 MLS (200 MG TOTAL) INTO FEEDING TUBE 4 (FOUR) TIMES DAILY - WITH MEALS AND AT BEDTIME.   mouth rinse LIQD solution 15 mLs by Mouth Rinse route 5 (five) times daily.   Nutritional Supplements (FEEDING SUPPLEMENT, OSMOLITE 1.5 CAL,) LIQD Place 325 mLs into feeding tube 4 (four) times daily. (Patient taking differently: Place 237 mLs into feeding tube 4 (four) times daily.)   Sennosides (SENNA) 8.8 MG/5ML SYRP 5 mLs (8.8 mg total)  by Feeding Tube route 2 (two) times daily as needed. For constipation   sildenafil (VIAGRA) 50 MG tablet Take 1 tablet (50 mg total) by mouth daily as needed for erectile dysfunction.   Water For Irrigation, Sterile (FREE WATER) SOLN Place 200 mLs into feeding tube every 8 (eight) hours. Use filtered or bottled water (not distilled water)   No facility-administered encounter medications on file as of 08/12/2022.    Allergies (verified) Patient has no known allergies.   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   Diabetes (HCC)    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 (CVA) 08/09/2019   Pneumonia    aspiration pneumonia   Renal insufficiency 05/30/2019   Sepsis with acute renal failure without septic shock (HCC)    Septic shock (HCC)    Status post insertion of percutaneous endoscopic gastrostomy (PEG) tube (HCC)    Stroke (HCC)    Urinary tract infection    Urinary tract infection without hematuria    Past Surgical History:  Procedure Laterality Date   CHOLECYSTECTOMY     IR GASTROSTOMY TUBE MOD SED  07/04/2019   IR REPLC GASTRO/COLONIC TUBE PERCUT W/FLUORO  12/11/2021   Family History  Adopted: Yes   Social History   Socioeconomic History   Marital status: Married    Spouse name: Not on file   Number of children: Not on file   Years of education: Not on file   Highest education level: Not on file  Occupational History   Not on file  Tobacco Use   Smoking status: Never   Smokeless tobacco: Never  Vaping Use   Vaping Use: Never used  Substance and Sexual Activity   Alcohol use: Yes   Drug use: No   Sexual activity: Yes  Other Topics Concern   Not on file  Social History Narrative   Not on file   Social Determinants of Health   Financial Resource Strain: Low Risk  (08/12/2022)   Overall Financial Resource Strain (CARDIA)    Difficulty of Paying Living Expenses: Not hard at all  Food Insecurity: No Food Insecurity (08/12/2022)   Hunger Vital Sign    Worried About Running Out of Food in the Last Year: Never true    Ran Out of Food in the Last Year: Never true  Transportation Needs: No Transportation Needs (08/12/2022)   PRAPARE - Administrator, Civil ServiceTransportation    Lack of Transportation (Medical): No    Lack of Transportation (Non-Medical):  No  Physical Activity: Insufficiently Active (08/12/2022)   Exercise Vital Sign    Days of Exercise per Week: 7 days    Minutes of Exercise per Session: 10 min  Stress: No Stress Concern Present (08/12/2022)   [employer] - Occupational Stress Questionnaire    Feeling of Stress : Not at all  Social Connections: Moderately Isolated (08/12/2022)   Social Connection and Isolation Panel [NHANES]    Frequency of Communication with Friends and Family: More than three times a week    Frequency of Social Gatherings with Friends and Family: More than three times a week    Attends Religious Services: Never    Database administratorActive Member of Clubs or Organizations: No    Attends BankerClub or Organization Meetings: Never    Marital Status: Married  Tobacco Counseling Counseling given: Not Answered   Clinical Intake:  Pre-visit preparation completed: No  Pain : No/denies painVirtual Visit via Telephone Note  I connected with  Michael Bright on 08/12/22 at  9:30 AM EDT by telephone and verified that I am speaking with the correct person using two identifiers.  Location: Patient: Home Provider: Office Persons participating in the virtual visit: patient/Nurse Health Advisor   I discussed the limitations, risks, security and privacy concerns of performing an evaluation and management service by telephone and the availability of in person appointments. The patient expressed understanding and agreed to proceed.  Interactive audio and video telecommunications were attempted between this nurse and patient, however failed, due to patient having technical difficulties OR patient did not have access to video capability.  We continued and completed visit with audio only.  Some vital signs may be absent or patient reported.   Tillie Rung, LPN      BMI - recorded: 36.21 Nutritional Status: BMI > 30  Obese Nutritional Risks: None Diabetes: Yes CBG done?: Yes CBG resulted in  Enter/ Edit results?: Yes (CBG 80 Taken by patient) Did pt. bring in CBG monitor from home?: NoNutrition Risk Assessment:  Has the patient had any N/V/D within the last 2 months?  No  Does the patient have any non-healing wounds?  No  Has the patient had any unintentional weight loss or weight gain?  No   Diabetes:  Is the patient diabetic?  Yes  If diabetic, was a CBG obtained today?  Yes CBG 80 Taken by patient Did the patient bring in their glucometer from home?  No  How often do you monitor your CBG's? 3 X Daily.   Financial Strains and Diabetes Management:  Are you having any financial strains with the device, your supplies or your medication? No .  Does the patient want to be seen by Chronic Care Management for management of their diabetes?  No  Would the patient like to be referred to a Nutritionist or for Diabetic Management?  No   Diabetic Exams:  Diabetic Eye Exam: Completed No. Overdue for diabetic eye exam. Pt has been advised about the importance in completing this exam. A referral has been placed today. Message sent to referral coordinator for scheduling purposes. Advised pt to expect a call from office referred to regarding appt.  Diabetic Foot Exam: Completed No. Pt has been advised about the importance in completing this exam. Pt is scheduled for diabetic foot exam on Followed by PCP.    How often do you need to have someone help you when you read instructions, pamphlets, or other written materials from your doctor or pharmacy?: 3 - Sometimes (Daughter Assist)  Diabetic? Yes  Interpreter Needed?: No  Information entered by :: Theresa Mulligan LPN   Activities of Daily Living    08/12/2022   12:55 PM  In your present state of health, do you have any difficulty performing the following activities:  Hearing? 0  Vision? 0  Difficulty concentrating or making decisions? 0  Walking or climbing stairs? 0  Dressing or bathing? 0  Doing errands, shopping? 0   Preparing Food and eating ? N  Using the Toilet? N  In the past six months, have you accidently leaked urine? N  Do you have problems with loss of bowel control? N  Managing your Medications? N  Managing your Finances? N  Housekeeping or managing your Housekeeping? N    Patient Care Team: Doreene Nest, NP  as PCP - General (Internal Medicine) Debroah Loop, DO (Otolaryngology) Love, Margarette Asal as Physician Assistant (Physical Medicine and Rehabilitation) Nelson Chimes, MD as Attending Physician (Ophthalmology)  Indicate any recent Medical Services you may have received from other than [facility] providers in the past year (date may be approximate).     Assessment:   This is a routine wellness examination for [place].  Hearing/Vision screen Hearing Screening - Comments:: Denies hearing difficulties   Vision Screening - Comments:: Wears rx glasses - up to date with routine eye exams with  Dr Algernon Huxley  Dietary issues and exercise activities discussed: Current Exercise Habits: Home exercise routine, Type of exercise: stretching;walking, Time (Minutes): 10, Frequency (Times/Week): 7, Weekly Exercise (Minutes/Week): 70, Exercise limited by: None identified   Goals Addressed               This Visit's Progress     Stay alive (pt-stated)         Depression Screen    08/12/2022   12:54 PM 12/18/2020    1:11 PM 06/19/2020    1:29 PM 09/12/2019    5:21 PM 08/22/2019   10:31 AM  PHQ 2/9 Scores  PHQ - 2 Score 0 0 0 0 0  PHQ- 9 Score    3 3    Fall Risk    08/12/2022   12:57 PM 06/24/2022   10:18 AM 06/25/2021    1:29 PM 12/18/2020    1:11 PM 08/23/2020    2:52 PM  Fall Risk   Falls in the past year? 0 0 0 0 0  Number falls in past yr: 0   0 0  Injury with Fall? 0    0  Risk for fall due to : No Fall Risks      Follow up Falls prevention discussed        FALL RISK PREVENTION PERTAINING TO THE HOME:  Any stairs in or around the home? Yes  If so, are there any without  handrails? No  Home free of loose throw rugs in walkways, pet beds, electrical cords, etc? Yes  Adequate lighting in your home to reduce risk of falls? Yes   ASSISTIVE DEVICES UTILIZED TO PREVENT FALLS:  Life alert? No  Use of a cane, walker or w/c? Yes  Grab bars in the bathroom? Yes  Shower chair or bench in shower? Yes  Elevated toilet seat or a handicapped toilet? Yes   TIMED UP AND GO:  Was the test performed? No . Audio Visit   Cognitive Function:        08/12/2022   12:58 PM  6CIT Screen  What Year? 0 points  What month? 0 points  What time? 0 points  Count back from 20 0 points  Months in reverse 2 points  Repeat phrase 0 points  Total Score 2 points    Immunizations Immunization History  Administered Date(s) Administered   Influenza Inj Mdck Quad Pf 07/01/2020   Influenza,inj,Quad PF,6+ Mos 06/28/2019, 07/18/2021   PFIZER(Purple Top)SARS-COV-2 Vaccination 12/23/2019, 01/13/2020, 08/03/2020   Pfizer Covid-19 Vaccine Bivalent Booster 69yrs & up 08/07/2021   Pneumococcal Polysaccharide-23 06/06/2019   Zoster Recombinat (Shingrix) 08/07/2021    TDAP status: Due, Education has been provided regarding the importance of this vaccine. Advised may receive this vaccine at local pharmacy or Health Dept. Aware to provide a copy of the vaccination record if obtained from local pharmacy or Health Dept. Verbalized acceptance and understanding.  Flu Vaccine status: Up to date  Pneumococcal vaccine status: Due, Education has been provided regarding the importance of this vaccine. Advised may receive this vaccine at local pharmacy or Health Dept. Aware to provide a copy of the vaccination record if obtained from local pharmacy or Health Dept. Verbalized acceptance and understanding.  Covid-19 vaccine status: Completed vaccines  Qualifies for Shingles Vaccine? Yes   Zostavax completed No   Shingrix Completed?: No.    Education has been provided regarding the importance of  this vaccine. Patient has been advised to call insurance company to determine out of pocket expense if they have not yet received this vaccine. Advised may also receive vaccine at local pharmacy or Health Dept. Verbalized acceptance and understanding.  Screening Tests Health Maintenance  Topic Date Due   HEMOGLOBIN A1C  03/04/2022   Diabetic kidney evaluation - GFR measurement  09/04/2022   Diabetic kidney evaluation - Urine ACR  09/04/2022   COVID-19 Vaccine (5 - Pfizer series) 08/28/2022 (Originally 06/06/2022)   Zoster Vaccines- Shingrix (2 of 2) 11/12/2022 (Originally 10/02/2021)   INFLUENZA VACCINE  01/17/2023 (Originally 05/19/2022)   Pneumonia Vaccine 34+ Years old (2 - PCV) 08/13/2023 (Originally 06/06/2022)   TETANUS/TDAP  08/13/2023 (Originally 06/06/1975)   OPHTHALMOLOGY EXAM  12/09/2022   FOOT EXAM  03/31/2023   Medicare Annual Wellness (AWV)  09/12/2023   COLONOSCOPY (Pts 45-50yrs Insurance coverage will need to be confirmed)  10/24/2026   Hepatitis C Screening  Completed   HIV Screening  Completed   HPV VACCINES  Aged Out    Health Maintenance  Health Maintenance Due  Topic Date Due   HEMOGLOBIN A1C  03/04/2022   Diabetic kidney evaluation - GFR measurement  09/04/2022   Diabetic kidney evaluation - Urine ACR  09/04/2022    Colorectal cancer screening: Type of screening: Colonoscopy. Completed 10/24/21. Repeat every 5 years  Lung Cancer Screening: (Low Dose CT Chest recommended if Age 35-80 years, 30 pack-year currently smoking OR have quit w/in 15years.) does not qualify.     Additional Screening:  Hepatitis C Screening: does qualify; Completed 08/23/20  Vision Screening: Recommended annual ophthalmology exams for early detection of glaucoma and other disorders of the eye. Is the patient up to date with their annual eye exam?  Yes  Who is the provider or what is the name of the office in which the patient attends annual eye exams? Dr Algernon Huxley If pt is not established  with a provider, would they like to be referred to a provider to establish care? No .   Dental Screening: Recommended annual dental exams for proper oral hygiene  Community Resource Referral / Chronic Care Management:  CRR required this visit?  No   CCM required this visit?  No      Plan:     I have personally reviewed and noted the following in the patient's chart:   Medical and social history Use of alcohol, tobacco or illicit drugs  Current medications and supplements including opioid prescriptions. Patient is not currently taking opioid prescriptions. Functional ability and status Nutritional status Physical activity Advanced directives List of other physicians Hospitalizations, surgeries, and ER visits in previous 12 months Vitals Screenings to include cognitive, depression, and falls Referrals and appointments  In addition, I have reviewed and discussed with patient certain preventive protocols, quality metrics, and best practice recommendations. A written personalized care plan for preventive services as well as general preventive health recommendations were provided to patient.     Tillie Rung, LPN   [id number]   Nurse  Notes: Patient due labs Hemoglobin A1C

## 2022-09-13 ENCOUNTER — Encounter (HOSPITAL_COMMUNITY): Payer: Self-pay

## 2022-09-13 ENCOUNTER — Emergency Department (HOSPITAL_COMMUNITY)
Admission: EM | Admit: 2022-09-13 | Discharge: 2022-09-13 | Disposition: A | Discharge status: Home / Self Care | Payer: BC Managed Care – PPO | Attending: Student | Admitting: Student

## 2022-09-13 ENCOUNTER — Emergency Department (HOSPITAL_COMMUNITY): Payer: BC Managed Care – PPO

## 2022-09-13 ENCOUNTER — Other Ambulatory Visit: Payer: Self-pay

## 2022-09-13 DIAGNOSIS — E119 Type 2 diabetes mellitus without complications: Secondary | ICD-10-CM | POA: Insufficient documentation

## 2022-09-13 DIAGNOSIS — I10 Essential (primary) hypertension: Secondary | ICD-10-CM | POA: Diagnosis not present

## 2022-09-13 DIAGNOSIS — Z8673 Personal history of transient ischemic attack (TIA), and cerebral infarction without residual deficits: Secondary | ICD-10-CM | POA: Diagnosis not present

## 2022-09-13 DIAGNOSIS — K9423 Gastrostomy malfunction: Secondary | ICD-10-CM | POA: Diagnosis present

## 2022-09-13 DIAGNOSIS — Z7982 Long term (current) use of aspirin: Secondary | ICD-10-CM | POA: Diagnosis not present

## 2022-09-13 DIAGNOSIS — Z79899 Other long term (current) drug therapy: Secondary | ICD-10-CM | POA: Diagnosis not present

## 2022-09-13 MED ORDER — DIATRIZOATE MEGLUMINE & SODIUM 66-10 % PO SOLN
30.0000 mL | Freq: Once | ORAL | Status: DC
  Filled 2022-09-13: qty 30

## 2022-09-13 MED ORDER — DIATRIZOATE MEGLUMINE & SODIUM 66-10 % PO SOLN
ORAL | Status: AC
  Administered 2022-09-13: 30 mL via GASTROSTOMY
  Filled 2022-09-13: qty 30

## 2022-09-13 NOTE — ED Provider Triage Note (Signed)
Emergency Medicine Provider Triage Evaluation Note  Michael Bright , a 66 y.o. male  was evaluated in triage.  Pt complains of G-tube dislodgment. Occurred this evening during his normal feed.  Stated that it just "popped out" spontaneously during his feed.  Denies abdominal pain or bleeding from the G-tube insertion site.  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  Review of Systems  Positive: See above Negative: See above  Physical Exam  BP 135/71 (BP Location: Left Arm)   Pulse 83   Temp 97.8 F (36.6 C) (Oral)   Resp 16   Ht 6' (1.829 m)   Wt 122.5 kg   SpO2 99%   BMI 36.62 kg/m  Gen:   Awake, no distress   Resp:  Normal effort  MSK:   Moves extremities without difficulty  Other:    Medical Decision Making  Medically screening exam initiated at 3:39 PM.  Appropriate orders placed.  Michael Bright was informed that the remainder of the evaluation will be completed by another provider, this initial triage assessment does not replace that evaluation, and the importance of remaining in the ED until their evaluation is complete.  Work up initiated   Gareth Eagle, PA-C 09/13/22 1541

## 2022-09-13 NOTE — ED Provider Notes (Signed)
[facility] EMER[facility] DEPARTMENT Provider Note  CSN: [id number] Arrival date & time: 09/13/22 1436  Chief Complaint(s) Gastric Tube Dislodgement   HPI Michael Bright is a 66 y.o. male with PMH CVA, aspiration pneumonia, persistent G-tube dependence who presents emerged department for evaluation of a dislodged gastrostomy tube.  Patient states that he has had some surrounding pain around the G-tube site and while administering a G-tube feeding today and the tube fell out.  He arrives with significant amount of granulation tissue surrounding the G-tube site but denies abdominal pain, nausea, vomiting, chest pain, shortness of breath, fever or other systemic symptoms.   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   Diabetes (HCC)    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 (CVA) 08/09/2019   Pneumonia    aspiration pneumonia   Renal insufficiency 05/30/2019   Sepsis with acute renal failure without septic shock (HCC)    Septic shock (HCC)    Status post insertion of percutaneous endoscopic gastrostomy (PEG) tube (HCC)    Stroke (HCC)    Urinary tract infection    Urinary tract infection without hematuria    Patient Active Problem List   Diagnosis Date Noted   Left lateral epicondylitis 06/24/2022   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 06/24/2022   Nail, injury by, initial encounter 06/24/2022   Wound of right leg 02/08/2022   Preventative health care 09/04/2021   Cervicalgia 06/25/2021   Pain due to onychomycosis of toenails of both feet 03/25/2021   Gait disorder 12/18/2020   Paresthesias 08/23/2020   Constipation 02/21/2020   Lower extremity edema 11/24/2019   Difficulty urinating 11/24/2019   Dupuytren's contracture of left hand  09/20/2019   Hyperlipidemia 08/24/2019   Upper and lower extremity pain 08/24/2019   OSA on CPAP 08/09/2019   Copious oral secretions 08/09/2019   Anemia of chronic disease    Oropharyngeal dysphagia    Controlled diabetes mellitus type 2 with complications (HCC)    Morbid obesity (HCC)    Feeding by G-tube (HCC)    History of CVA (cerebrovascular accident) 06/27/2019   Essential hypertension 06/14/2019   Diplopia    Chronic fatigue    Home Medication(s) Prior to Admission medications   Medication Sig Start Date End Date Taking? Authorizing Provider  Accu-Chek FastClix Lancets MISC USE AS DIRECTED UP TO FOUR TIMES DAILY 09/26/19   Doreene Nest, NP  acetaminophen (TYLENOL) 325 MG tablet 325-650 mg by Per G Tube route as needed. 07/17/19   [provider]  aspirin 81 MG chewable tablet 81 mg by Per G Tube route daily. 06/28/19   [provider]  atorvastatin (LIPITOR) 40 MG tablet TAKE 1 TABLET (40 MG TOTAL) BY MOUTH AT BEDTIME. FOR CHOLESTEROL. 10/13/21   Doreene Nest, NP  chlorhexidine (PERIDEX) 0.12 % solution RINSE AND SPIT WITH 15 MLS BY MOUTH 2 (TWO) TIMES DAILY. 04/21/22   Ranelle Oyster, MD  finasteride (PROSCAR) 5 MG tablet PLACE ONE TABLET IN GI TUBE ONCE DAILY FOR URINE FLOW. 04/08/22   Doreene Nest, NP  fluticasone (FLONASE) 50 MCG/ACT nasal spray PLACE 1 SPRAY INTO BOTH NOSTRILS 2 (TWO) TIMES DAILY AS NEEDED FOR ALLERGIES OR RHINITIS. 02/27/20   Doreene Nest, NP  gabapentin (NEURONTIN) 250 MG/5ML solution TAKE  2.5-5 MLS (125-250 MG TOTAL) BY MOUTH AT BEDTIME. FOR PAIN. 01/09/22   Ranelle Oyster, MD  glucose blood (ACCU-CHEK GUIDE) test strip USE AS DIRECTED UP TO FOUR TIMES DAILY 09/29/21   Doreene Nest, NP  glycopyrrolate (ROBINUL) 1 MG tablet Place 2 tablets (2 mg total) into feeding tube 3 (three) times daily. For oral secretions. Office visit required for further refills. 07/09/22   Doreene Nest, NP  guaifenesin (ROBITUSSIN)  100 MG/5ML syrup PLACE 10 MLS (200 MG TOTAL) INTO FEEDING TUBE 4 (FOUR) TIMES DAILY - WITH MEALS AND AT BEDTIME. 10/11/19   Doreene Nest, NP  mouth rinse LIQD solution 15 mLs by Mouth Rinse route 5 (five) times daily. 07/17/19   Love, Evlyn Kanner, PA-C  Nutritional Supplements (FEEDING SUPPLEMENT, OSMOLITE 1.5 CAL,) LIQD Place 325 mLs into feeding tube 4 (four) times daily. Patient taking differently: Place 237 mLs into feeding tube 4 (four) times daily. 08/07/19   Love, Evlyn Kanner, PA-C  Sennosides (SENNA) 8.8 MG/5ML SYRP 5 mLs (8.8 mg total) by Feeding Tube route 2 (two) times daily as needed. For constipation 02/27/22   Doreene Nest, NP  sildenafil (VIAGRA) 50 MG tablet Take 1 tablet (50 mg total) by mouth daily as needed for erectile dysfunction. 12/18/20   Ranelle Oyster, MD  Water For Irrigation, Sterile (FREE WATER) SOLN Place 200 mLs into feeding tube every 8 (eight) hours. Use filtered or bottled water (not distilled water) 08/08/19   Love, Evlyn Kanner, PA-C                                                                                                                                    Past Surgical History Past Surgical History:  Procedure Laterality Date   CHOLECYSTECTOMY     IR GASTROSTOMY TUBE MOD SED  07/04/2019   IR REPLC GASTRO/COLONIC TUBE PERCUT W/FLUORO  12/11/2021   Family History Family History  Adopted: Yes    Social History Social History   Tobacco Use   Smoking status: Never   Smokeless tobacco: Never  Vaping Use   Vaping Use: Never used  Substance Use Topics   Alcohol use: Yes   Drug use: No   Allergies Patient has no known allergies.  Review of Systems Review of Systems  All other systems reviewed and are negative.   Physical Exam Vital Signs  I have reviewed the triage vital signs BP 135/71 (BP Location: Left Arm)   Pulse 83   Temp 97.8 F (36.6 C) (Oral)   Resp 16   Ht 6' (1.829 m)   Wt 122.5 kg   SpO2 99%   BMI 36.62 kg/m   Physical  Exam Constitutional:      General: He is not in acute distress.    Appearance: Normal appearance.  HENT:     Head: Normocephalic and atraumatic.     Nose: No congestion or rhinorrhea.  Eyes:     General:        Right eye: No discharge.        Left eye: No discharge.     Extraocular Movements: Extraocular movements intact.     Pupils: Pupils are equal, round, and reactive to light.  Cardiovascular:     Rate and Rhythm: Normal rate and regular rhythm.     Heart sounds: No murmur heard. Pulmonary:     Effort: No respiratory distress.     Breath sounds: No wheezing or rales.  Abdominal:     General: There is no distension.     Tenderness: There is no abdominal tenderness.     Comments: G-tube site with granulation tissue but no significant surrounding erythema  Musculoskeletal:        General: Normal range of motion.     Cervical back: Normal range of motion.  Skin:    General: Skin is warm and dry.  Neurological:     General: No focal deficit present.     Mental Status: He is alert.      ED Results and Treatments Labs (all labs ordered are listed, but only abnormal results are displayed) Labs Reviewed - No data to display                                                                                                                        Radiology No results found.  Pertinent labs & imaging results that were available during my care of the patient were reviewed by me and considered in my medical decision making (see MDM for details).  Medications Ordered in ED Medications - No data to display                                                                                                                                   Procedures Gastrostomy tube replacement  Date/Time: 09/14/2022 12:21 PM  Performed by: Glendora ScoreKommor, Midas Daughety, MD Authorized by: Glendora ScoreKommor, Haydyn Girvan, MD  Consent: Verbal consent obtained. Risks and benefits: risks, benefits and alternatives were  discussed Consent given by: patient Patient identity confirmed: verbally with patient Time out: Immediately prior to procedure a "time out" was called to verify the correct patient, procedure, equipment, support staff and site/side marked as required. Preparation: Patient was prepped and draped in the usual sterile fashion. Local anesthesia used: no  Anesthesia: Local anesthesia used: no  Sedation: Patient sedated:  no  Patient tolerance: patient tolerated the procedure well with no immediate complications     (including critical care time)  Medical Decision Making / ED Course   This patient presents to the ED for concern of G-tube replacement, this involves an extensive number of treatment options, and is a complaint that carries with it a high risk of complications and morbidity.  The differential diagnosis includes dislodged G-tube, G-tube site infection, granulation tissue buildup  MDM: Patient seen emergency room for evaluation of a G-tube replacement.  Physical exam with granulation tissue around the G-tube site but is otherwise unremarkable.  On initial first attempt with a 20 [place] G-tube, the G-tube likely was successfully placed but we felt a significant amount of resistance when inflating the balloon and thus stopped and consulted our surgical colleagues.  Dr. Cliffton Asters of general surgery came and replaced the G-tube with similar findings but believes that he has likely pulled the abdominal wall skin close to the opening and believes that the tube is likely in place.  A confirmatory x-ray was obtained that does show appropriate placement of the G-tube.  Patient then discharged with outpatient follow-up   Additional history obtained: -Additional history obtained from wife -External records from outside source obtained and reviewed including: Chart review including previous notes, labs, imaging, consultation notes   Lab Tests: -I ordered, reviewed, and interpreted labs.   The  pertinent results include:   Labs Reviewed - No data to display      Imaging Studies ordered: I ordered imaging studies including x-ray G-tube study I independently visualized and interpreted imaging. I agree with the radiologist interpretation   Medicines ordered and prescription drug management: No orders of the defined types were placed in this encounter.   -I have reviewed the patients home medicines and have made adjustments as needed  Critical interventions none  Consultations Obtained: I requested consultation with the general surgeon Dr. Cliffton Asters,  and discussed lab and imaging findings as well as pertinent plan - they recommend: G-tube replacement and confirmatory x-ray   Cardiac Monitoring: The patient was maintained on a cardiac monitor.  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   Diabetes (HCC)    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 (CVA) 08/09/2019   Pneumonia    aspiration pneumonia   Renal insufficiency 05/30/2019   Sepsis with acute renal failure without septic shock (HCC)    Septic shock (HCC)    Status post insertion of percutaneous endoscopic gastrostomy (PEG) tube (HCC)    Stroke (HCC)    Urinary tract infection    Urinary tract infection without hematuria       Dispostion: I considered admission for this patient, but he does not meet inpatient criteria for admission and is safe for  discharge with outpatient follow-up     Final Clinical Impression(s) / ED Diagnoses Final diagnoses:  None     @PCDICTATION @    , MD 09/14/22 1224

## 2022-09-13 NOTE — ED Triage Notes (Signed)
Pt reports his G-tube dislodged today around 1pm. He has it with him in a bag. Denies pain.

## 2022-09-13 NOTE — Consult Note (Addendum)
CC/Reason for consult: G tube fell out  HPI: Michael Bright is an 66 y.o. male with hx of CVA 2020 resulting in significant dysphagia, now g-tube partially dependent.  He reports that he is able to have some bites of ice cream as well as some full liquids by mouth but not anything thinner and is not able to keep up with his enteral nutrition via the p.o. route and therefore uses his G-tube.  He is here with his wife.  They report he had a G-tube exchanged back in February of this year but he has had his G-tube in place now for about 3 years.  Over the last couple of weeks he has sequentially had essentially on the bumper on his G-tube to keep it snug.  Today, the G-tube reportedly popped free.  He came in the emergency room for evaluation.  Attempts were made with Dr. Matilde Sprang for replacement but noted difficulty and resistance on attempts.   Currently, he denies any abdominal pain. G-tube has been working well including this morning prior to coming out.  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   Diabetes ([place])    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 (CVA) 08/09/2019   Pneumonia    aspiration pneumonia   Renal insufficiency 05/30/2019   Sepsis with acute renal failure without septic shock (HCC)    Septic shock (HCC)    Status post insertion of percutaneous endoscopic gastrostomy (PEG) tube (HCC)    Stroke ([place])    Urinary tract infection    Urinary tract infection without hematuria     Past Surgical History:  Procedure Laterality Date   CHOLECYSTECTOMY     IR GASTROSTOMY TUBE MOD SED  07/04/2019   IR [place] GASTRO/COLONIC TUBE PERCUT W/FLUORO  12/11/2021    Family History  Adopted: Yes    Social:  reports that he has never smoked. He has never used smokeless  tobacco. He reports current alcohol use. He reports that he does not use drugs.  Allergies: No Known Allergies  Medications: I have reviewed the patient's current medications.  No results found for this or any previous visit (from the past 48 hour(s)).  No results found.  ROS - all of the below systems have been reviewed with the patient and positives are indicated with bold text General: chills, fever or night sweats Eyes: blurry vision or double vision ENT: epistaxis or sore throat Allergy/Immunology: itchy/watery eyes or nasal congestion Hematologic/Lymphatic: bleeding problems, blood clots or swollen lymph nodes Endocrine: temperature intolerance or unexpected weight changes Breast: new or changing breast lumps or nipple discharge Resp: cough, shortness of breath, or wheezing CV: chest pain or dyspnea on exertion GI: as per HPI GU: dysuria, trouble voiding, or hematuria MSK: joint pain or joint stiffness Neuro: TIA or stroke symptoms Derm: pruritus and skin lesion changes Psych: anxiety and depression  PE Blood pressure 135/71, pulse 83, temperature 97.8 F (36.6 C), temperature source Oral, resp. rate 16, height 6' (1.829 m), weight 122.5 kg, SpO2 99 %. 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  Psychiatric: Appropriate affect  No results found for this or any previous visit (from the past 48  hour(s)).  No results found.  A/P: Michael Bright is an 66 y.o. male with recently displaced g tube  -Looking at his previous G-tube, there is only about 2 cm between the bumper and the balloon.  I suspect he has steadily been cinching down his G-tube and in doing so has likely caused the tube to finally erode out to the level of the skin.  We were able to successfully replace a 20 [place] G-tube and are  currently obtaining a tube study.  -If study demonstrates that the tube is in appropriate position within the stomach with contrast in the stomach, may continue to use this but will need an outpatient evaluation with interventional radiology for a formal tube replacement.  In the meantime, discussed avoiding stenting down the tube as this may cause the tube to back through   I spent a total of 60 minutes in both face-to-face and non-face-to-face activities, excluding procedures performed, for this visit on the date of this encounter.  Marin Olp, MD [facility], [facility]

## 2022-09-15 NOTE — Progress Notes (Unsigned)
    Aava Deland T. Karlisha Mathena, MD, CAQ Sports Medicine [facility] at [facility] [address] [place], [address]  Phone: [phone]  FAX: [phone]  Michael Bright - 66 y.o. male  MRN [id number]  Date of Birth: Jul 14, 1956  Date: 09/16/2022  PCP: Doreene Nest, NP  Referral: Doreene Nest, NP  No chief complaint on file.  Subjective:   Michael Bright is a 66 y.o. very pleasant male patient with There is no height or weight on file to calculate BMI. who presents with the following:  Patient presents with ongoing abdominal pain.    Review of Systems is noted in the HPI, as appropriate  Objective:   There were no vitals taken for this visit.  GEN: No acute distress; alert,appropriate. PULM: Breathing comfortably in no respiratory distress PSYCH: Normally interactive.   Laboratory and Imaging Data:  Assessment and Plan:   ***

## 2022-09-16 ENCOUNTER — Ambulatory Visit (INDEPENDENT_AMBULATORY_CARE_PROVIDER_SITE_OTHER): Payer: BC Managed Care – PPO | Admitting: Family Medicine

## 2022-09-16 ENCOUNTER — Encounter: Payer: Self-pay | Admitting: Family Medicine

## 2022-09-16 ENCOUNTER — Ambulatory Visit: Payer: BC Managed Care – PPO | Admitting: Family Medicine

## 2022-09-16 VITALS — BP 126/60 | HR 88 | Temp 98.4°F | Ht 72.0 in | Wt 257.0 lb

## 2022-09-16 DIAGNOSIS — Z931 Gastrostomy status: Secondary | ICD-10-CM

## 2022-09-16 DIAGNOSIS — G464 Cerebellar stroke syndrome: Secondary | ICD-10-CM

## 2022-09-16 DIAGNOSIS — K9423 Gastrostomy malfunction: Secondary | ICD-10-CM

## 2022-09-27 ENCOUNTER — Other Ambulatory Visit: Payer: Self-pay | Admitting: Primary Care

## 2022-09-27 ENCOUNTER — Other Ambulatory Visit: Payer: Self-pay | Admitting: Physical Medicine & Rehabilitation

## 2022-09-27 DIAGNOSIS — I69398 Other sequelae of cerebral infarction: Secondary | ICD-10-CM

## 2022-09-27 DIAGNOSIS — R6889 Other general symptoms and signs: Secondary | ICD-10-CM

## 2022-09-27 DIAGNOSIS — E785 Hyperlipidemia, unspecified: Secondary | ICD-10-CM

## 2022-09-27 DIAGNOSIS — E1142 Type 2 diabetes mellitus with diabetic polyneuropathy: Secondary | ICD-10-CM

## 2022-09-27 DIAGNOSIS — Z8673 Personal history of transient ischemic attack (TIA), and cerebral infarction without residual deficits: Secondary | ICD-10-CM

## 2022-09-27 DIAGNOSIS — N319 Neuromuscular dysfunction of bladder, unspecified: Secondary | ICD-10-CM

## 2022-09-27 NOTE — Telephone Encounter (Signed)
Patient is due for CPE/follow up now. Please schedule.  Will you let me know once he is scheduled?

## 2022-09-28 ENCOUNTER — Telehealth (HOSPITAL_COMMUNITY): Payer: Self-pay

## 2022-09-28 ENCOUNTER — Telehealth: Payer: Self-pay | Admitting: Primary Care

## 2022-09-28 NOTE — Telephone Encounter (Signed)
Called to schedule peg replacement, no answer, left vm. AW  

## 2022-09-28 NOTE — Telephone Encounter (Signed)
Patient wife called in and stated that [facility] had seen Dr. Patsy Lager. She stated they still haven't heard anything regarding his g-tube replacement. Please advise. Thank you

## 2022-09-28 NOTE — Telephone Encounter (Signed)
Patient has been scheduled

## 2022-09-29 ENCOUNTER — Ambulatory Visit (INDEPENDENT_AMBULATORY_CARE_PROVIDER_SITE_OTHER): Payer: BC Managed Care – PPO | Admitting: Primary Care

## 2022-09-29 ENCOUNTER — Ambulatory Visit: Payer: Medicare Other | Admitting: Podiatry

## 2022-09-29 ENCOUNTER — Encounter: Payer: Self-pay | Admitting: Primary Care

## 2022-09-29 VITALS — BP 146/78 | HR 86 | Temp 98.4°F | Ht 72.0 in | Wt 266.0 lb

## 2022-09-29 DIAGNOSIS — G464 Cerebellar stroke syndrome: Secondary | ICD-10-CM

## 2022-09-29 DIAGNOSIS — Z23 Encounter for immunization: Secondary | ICD-10-CM

## 2022-09-29 DIAGNOSIS — Z125 Encounter for screening for malignant neoplasm of prostate: Secondary | ICD-10-CM | POA: Diagnosis not present

## 2022-09-29 DIAGNOSIS — E785 Hyperlipidemia, unspecified: Secondary | ICD-10-CM | POA: Diagnosis not present

## 2022-09-29 DIAGNOSIS — Z931 Gastrostomy status: Secondary | ICD-10-CM

## 2022-09-29 DIAGNOSIS — I10 Essential (primary) hypertension: Secondary | ICD-10-CM | POA: Diagnosis not present

## 2022-09-29 DIAGNOSIS — E1165 Type 2 diabetes mellitus with hyperglycemia: Secondary | ICD-10-CM | POA: Diagnosis not present

## 2022-09-29 DIAGNOSIS — Z8673 Personal history of transient ischemic attack (TIA), and cerebral infarction without residual deficits: Secondary | ICD-10-CM

## 2022-09-29 DIAGNOSIS — R1312 Dysphagia, oropharyngeal phase: Secondary | ICD-10-CM

## 2022-09-29 DIAGNOSIS — K5900 Constipation, unspecified: Secondary | ICD-10-CM

## 2022-09-29 DIAGNOSIS — R39198 Other difficulties with micturition: Secondary | ICD-10-CM

## 2022-09-29 DIAGNOSIS — R6889 Other general symptoms and signs: Secondary | ICD-10-CM

## 2022-09-29 DIAGNOSIS — G4733 Obstructive sleep apnea (adult) (pediatric): Secondary | ICD-10-CM

## 2022-09-29 NOTE — Addendum Note (Signed)
Addended by: Eual Fines on: 09/29/2022 04:29 PM   Modules accepted: Orders

## 2022-09-29 NOTE — Assessment & Plan Note (Signed)
Historically controlled. Repeat A1C pending.  Remain off of medications.  Urine microalbumin due and pending.  Follow-up in 12 months.

## 2022-09-29 NOTE — Assessment & Plan Note (Signed)
Scheduled for G-tube re-insertion.  Continue supplemental feeding per G-tube once in place.

## 2022-09-29 NOTE — Progress Notes (Signed)
Subjective:    Patient ID: Michael Bright, male    DOB: 03-17-1956, 66 y.o.   MRN: [id number]  HPI  Michael Bright is a very pleasant 66 y.o. male with a history of hypertension, type 2 diabetes, CVA, G-tube placement, anemia, constipation, paresthesias, OSA, oropharyngeal dysphagia who presents today for follow-up of chronic conditions.  1) Hypertension: Currently not managed on treatment.  Historically blood pressure has been well-controlled off of regimen.  He denies chest pain, shortness of breath, headaches, dizziness.  BP Readings from Last 3 Encounters:  09/29/22 (!) 146/78  09/16/22 126/60  09/13/22 136/82   2)History of CVA: Currently managed on atorvastatin 40 mg daily.  He is due for repeat lipid panel today.  Also managed on glycopyrrolate 2 mg 3 times daily for excessive secretions, feeding supplement per G-tube, finasteride 5 mg daily for neurogenic bladder.   He presented to [facility] ED on 09/13/2022 for dislodged gastrotomy tube.  General surgery consulted and gastrotomy tube was replaced, confirmed with dye test.  The following day his G-tube dislodged again and he has been without his G-tube since.  He has been eating chips and drinking his nutritional drink orally.  He has experienced some choking.  He denies cough, fevers, aspiration of fluids.   He has an appointment to have his G-tube replaced this week.  3) Type 2 Diabetes:  Current medications include: None  He is checking his blood glucose 3 times daily and is getting readings of:  AM fasting: 80's-90's Before Lunch: low 100's Before Dinner: 90's.   Last A1C: 5.6 in November 2022 Last Eye Exam: Up-to-date Last Foot Exam: Up-to-date Pneumonia Vaccination: 2020 Urine Microalbumin: Due Statin: Atorvastatin  Dietary changes since last visit: None.  Historically fed per G-tube.   Exercise: None.    Review of Systems  Respiratory:  Negative for cough and shortness of breath.   Cardiovascular:  Negative  for chest pain.  Gastrointestinal:  Negative for abdominal pain, constipation and diarrhea.  Genitourinary:  Negative for difficulty urinating.  Neurological:  Negative for headaches.  Psychiatric/Behavioral:  The patient is not nervous/anxious.          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   Diabetes (HCC)    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 (CVA) 08/09/2019   Pneumonia    aspiration pneumonia   Renal insufficiency 05/30/2019   Sepsis with acute renal failure without septic shock (HCC)    Septic shock (HCC)    Status post insertion of percutaneous endoscopic gastrostomy (PEG) tube (HCC)    Stroke (HCC)    Urinary tract infection    Urinary tract infection without hematuria     Social History   Socioeconomic History   Marital status: Married    Spouse name: Not on file   Number of children: Not on file   Years of education: Not on file   Highest education level: Not on file  Occupational History   Not on file  Tobacco Use   Smoking status: Never   Smokeless tobacco: Never  Vaping Use   Vaping Use: Never used  Substance and Sexual Activity   Alcohol use: Yes   Drug use: No   Sexual activity: Yes  Other Topics Concern   Not on file  Social History Narrative  Not on file   Social Determinants of Health   Financial Resource Strain: Low Risk  (08/12/2022)   Overall Financial Resource Strain (CARDIA)    Difficulty of Paying Living Expenses: Not hard at all  Food Insecurity: No Food Insecurity (08/12/2022)   Hunger Vital Sign    Worried About Running Out of Food in the Last Year: Never true    Ran Out of Food in the Last Year: Never true  Transportation Needs: No Transportation Needs (08/12/2022)   PRAPARE - Radiographer, therapeutic (Medical): No    Lack of Transportation (Non-Medical): No  Physical Activity: Insufficiently Active (08/12/2022)   Exercise Vital Sign    Days of Exercise per Week: 7 days    Minutes of Exercise per Session: 10 min  Stress: No Stress Concern Present (08/12/2022)   [place]    Feeling of Stress : Not at all  Social Connections: Moderately Isolated (08/12/2022)   Social Connection and Isolation Panel [NHANES]    Frequency of Communication with Friends and Family: More than three times a week    Frequency of Social Gatherings with Friends and Family: More than three times a week    Attends Religious Services: Never    Marine scientist or Organizations: No    Attends Archivist Meetings: Never    Marital Status: Married  Human resources officer Violence: Not At Risk (08/12/2022)   Humiliation, Afraid, Rape, and Kick questionnaire    Fear of Current or Ex-Partner: No    Emotionally Abused: No    Physically Abused: No    Sexually Abused: No    Past Surgical History:  Procedure Laterality Date   CHOLECYSTECTOMY     IR GASTROSTOMY TUBE MOD SED  07/04/2019   IR [place] GASTRO/COLONIC TUBE PERCUT W/FLUORO  12/11/2021    Family History  Adopted: Yes    No Known Allergies  Current Outpatient Medications on File Prior to Visit  Medication Sig Dispense Refill   Accu-Chek FastClix Lancets MISC USE AS DIRECTED UP TO FOUR TIMES DAILY 102 each 5   acetaminophen (TYLENOL) 325 MG tablet 325-650 mg by Per G Tube route as needed.     aspirin 81 MG chewable tablet 81 mg by Per G Tube route daily.     atorvastatin (LIPITOR) 40 MG tablet TAKE 1 TABLET (40 MG TOTAL) BY MOUTH AT BEDTIME. FOR CHOLESTEROL. 90 tablet 3   chlorhexidine (PERIDEX) 0.12 % solution RINSE AND SPIT WITH 15 MLS BY MOUTH 2 (TWO) TIMES DAILY. 473 mL 6   finasteride (PROSCAR) 5 MG tablet PLACE ONE TABLET IN GI TUBE ONCE DAILY FOR URINE FLOW.  90 tablet 1   fluticasone (FLONASE) 50 MCG/ACT nasal spray PLACE 1 SPRAY INTO BOTH NOSTRILS 2 (TWO) TIMES DAILY AS NEEDED FOR ALLERGIES OR RHINITIS. 16 mL 2   gabapentin (NEURONTIN) 250 MG/5ML solution TAKE 2.5-5 MLS (125-250 MG TOTAL) BY MOUTH AT BEDTIME. FOR PAIN. 150 mL 6   glucose blood (ACCU-CHEK GUIDE) test strip USE AS DIRECTED UP TO FOUR TIMES DAILY 400 strip 3   glycopyrrolate (ROBINUL) 1 MG tablet Place 2 tablets (2 mg total) into feeding tube 3 (three) times daily. For oral secretions. Office visit required for further refills. 540 tablet 0   guaifenesin (ROBITUSSIN) 100 MG/5ML syrup PLACE 10 MLS (200 MG TOTAL) INTO FEEDING TUBE 4 (FOUR) TIMES DAILY - WITH MEALS AND AT BEDTIME. 948 mL 0   mouth rinse  LIQD solution 15 mLs by Mouth Rinse route 5 (five) times daily. 946 mL 1   Nutritional Supplements (FEEDING SUPPLEMENT, OSMOLITE 1.5 CAL,) LIQD Place 325 mLs into feeding tube 4 (four) times daily. (Patient taking differently: Place 237 mLs into feeding tube 4 (four) times daily.)  0   Sennosides (SENNA) 8.8 MG/5ML SYRP 5 mLs (8.8 mg total) by Feeding Tube route 2 (two) times daily as needed. For constipation 900 mL 1   Water For Irrigation, Sterile (FREE WATER) SOLN Place 200 mLs into feeding tube every 8 (eight) hours. Use filtered or bottled water (not distilled water)     No current facility-administered medications on file prior to visit.    BP (!) 146/78   Pulse 86   Temp 98.4 F (36.9 C) (Temporal)   Ht 6' (1.829 m)   Wt 266 lb (120.7 kg)   SpO2 92%   BMI 36.08 kg/m  Objective:   Physical Exam Cardiovascular:     Rate and Rhythm: Normal rate and regular rhythm.  Pulmonary:     Effort: Pulmonary effort is normal.     Breath sounds: Normal breath sounds. No wheezing or rales.  Abdominal:     Palpations: Abdomen is soft.     Tenderness: There is no abdominal tenderness.  Musculoskeletal:     Cervical back: Neck supple.  Skin:    General: Skin is warm and dry.      Comments: G-tube site without erythema or skin breakdown.  Neurological:     Mental Status: He is alert and oriented to person, place, and time.           Assessment & Plan:   Problem List Items Addressed This Visit       Cardiovascular and Mediastinum   Essential hypertension    Above goal today, historically controlled.  Requested that he start monitoring blood pressure at home and report if readings are consistently at or above 140/90.  Remain off treatment for now.        Respiratory   Oropharyngeal dysphagia    Scheduled for G-tube re-insertion.  Continue supplemental feeding per G-tube once in place.      OSA on CPAP    Continue nightly CPAP use.  Will submit order for new section canister, tubing, wand. Suction machine working correctly.        Digestive   Copious oral secretions    Overall controlled.  Continue glycopyrrolate 2 mg 3 times daily.        Endocrine   Controlled type 2 diabetes mellitus with hyperglycemia (HCC) - Primary    Historically controlled. Repeat A1C pending.  Remain off of medications.  Urine microalbumin due and pending.  Follow-up in 12 months.      Relevant Orders   Hemoglobin A1c   Microalbumin / creatinine urine ratio     Other   History of CVA (cerebrovascular accident)    No new symptoms.  Labs pending today. Continue lipid control with LDL goal less than 70, blood pressure control, diabetes control.      Feeding by G-tube (HCC)    Continue nutritional supplement once G-tube has been replaced.      Hyperlipidemia    Continue atorvastatin 40 mg daily.  Repeat lipid panel pending.      Relevant Orders   Lipid panel   Comprehensive metabolic panel   Difficulty urinating    Controlled.  Continue finasteride 5 mg daily.      Constipation    Controlled.  Continue senna 8.8 mg per 5 mL as needed.      Other Visit Diagnoses     Screening for prostate cancer       Relevant Orders   PSA,  Medicare   Need for pneumococcal 20-valent conjugate vaccination              Doreene Nest, NP

## 2022-09-29 NOTE — Assessment & Plan Note (Signed)
Continue nutritional supplement once G-tube has been replaced.

## 2022-09-29 NOTE — Assessment & Plan Note (Signed)
Continue atorvastatin 40 mg daily. Repeat lipid panel pending. 

## 2022-09-29 NOTE — Assessment & Plan Note (Signed)
Above goal today, historically controlled.  Requested that he start monitoring blood pressure at home and report if readings are consistently at or above 140/90.  Remain off treatment for now.

## 2022-09-29 NOTE — Patient Instructions (Signed)
Stop by the lab prior to leaving today. I will notify you of your results once received.   It was a pleasure to see you today!  

## 2022-09-29 NOTE — Assessment & Plan Note (Signed)
Controlled.  Continue senna 8.8 mg per 5 mL as needed.

## 2022-09-29 NOTE — Assessment & Plan Note (Signed)
Continue nightly CPAP use.  Will submit order for new section canister, tubing, wand. Suction machine working correctly.

## 2022-09-29 NOTE — Telephone Encounter (Signed)
He should have been contacted by IR yesterday for set-up.  Mrs. Michael Bright, PCP, helped to assist.

## 2022-09-29 NOTE — Assessment & Plan Note (Signed)
Controlled.  Continue finasteride 5 mg daily.

## 2022-09-29 NOTE — Assessment & Plan Note (Signed)
No new symptoms.  Labs pending today. Continue lipid control with LDL goal less than 70, blood pressure control, diabetes control.

## 2022-09-29 NOTE — Assessment & Plan Note (Signed)
Overall controlled.  Continue glycopyrrolate 2 mg 3 times daily.

## 2022-09-30 LAB — COMPREHENSIVE METABOLIC PANEL
ALT: 19 U/L (ref 0–53)
AST: 18 U/L (ref 0–37)
Albumin: 3.9 g/dL (ref 3.5–5.2)
Alkaline Phosphatase: 60 U/L (ref 39–117)
BUN: 20 mg/dL (ref 6–23)
CO2: 32 mEq/L (ref 19–32)
Calcium: 8.9 mg/dL (ref 8.4–10.5)
Chloride: 104 mEq/L (ref 96–112)
Creatinine, Ser: 0.61 mg/dL (ref 0.40–1.50)
GFR: 100.93 mL/min (ref >60.00)
Glucose, Bld: 82 mg/dL (ref 70–99)
Potassium: 4.2 mEq/L (ref 3.5–5.1)
Sodium: 142 mEq/L (ref 135–145)
Total Bilirubin: 0.4 mg/dL (ref 0.2–1.2)
Total Protein: 7.1 g/dL (ref 6.0–8.3)

## 2022-09-30 LAB — LIPID PANEL
Cholesterol: 129 mg/dL (ref 0–200)
HDL: 59 mg/dL (ref >39.00)
LDL Cholesterol: 60 mg/dL (ref 0–99)
NonHDL: 69.59
Total CHOL/HDL Ratio: 2
Triglycerides: 48 mg/dL (ref 0.0–149.0)
VLDL: 9.6 mg/dL (ref 0.0–40.0)

## 2022-09-30 LAB — PSA, MEDICARE: PSA: 1.27 ng/ml (ref 0.10–4.00)

## 2022-09-30 LAB — HEMOGLOBIN A1C: Hgb A1c MFr Bld: 6 % (ref 4.6–6.5)

## 2022-10-02 ENCOUNTER — Other Ambulatory Visit: Payer: Self-pay

## 2022-10-02 ENCOUNTER — Other Ambulatory Visit: Payer: Self-pay | Admitting: Physician Assistant

## 2022-10-02 ENCOUNTER — Ambulatory Visit (HOSPITAL_COMMUNITY)
Admission: RE | Admit: 2022-10-02 | Discharge: 2022-10-02 | Disposition: A | Discharge status: Home / Self Care | Payer: BC Managed Care – PPO | Source: Ambulatory Visit | Attending: Family Medicine | Admitting: Family Medicine

## 2022-10-02 DIAGNOSIS — Z931 Gastrostomy status: Secondary | ICD-10-CM

## 2022-10-02 DIAGNOSIS — Z8673 Personal history of transient ischemic attack (TIA), and cerebral infarction without residual deficits: Secondary | ICD-10-CM | POA: Diagnosis not present

## 2022-10-02 DIAGNOSIS — I69391 Dysphagia following cerebral infarction: Secondary | ICD-10-CM

## 2022-10-02 DIAGNOSIS — N319 Neuromuscular dysfunction of bladder, unspecified: Secondary | ICD-10-CM | POA: Diagnosis not present

## 2022-10-02 DIAGNOSIS — K9423 Gastrostomy malfunction: Secondary | ICD-10-CM

## 2022-10-02 DIAGNOSIS — E119 Type 2 diabetes mellitus without complications: Secondary | ICD-10-CM | POA: Insufficient documentation

## 2022-10-02 HISTORY — PX: IR REPLC GASTRO/COLONIC TUBE PERCUT W/FLUORO: IMG2333

## 2022-10-02 LAB — CBC
HCT: 37.2 % — ABNORMAL LOW (ref 39.0–52.0)
Hemoglobin: 12.4 g/dL — ABNORMAL LOW (ref 13.0–17.0)
MCH: 31.2 pg (ref 26.0–34.0)
MCHC: 33.3 g/dL (ref 30.0–36.0)
MCV: 93.5 fL (ref 80.0–100.0)
Platelets: 167 10*3/uL (ref 150–400)
RBC: 3.98 MIL/uL — ABNORMAL LOW (ref 4.22–5.81)
RDW: 12.1 % (ref 11.5–15.5)
WBC: 5.1 10*3/uL (ref 4.0–10.5)
nRBC: 0 % (ref 0.0–0.2)

## 2022-10-02 LAB — PROTIME-INR
INR: 1.1 (ref 0.8–1.2)
Prothrombin Time: 14.2 seconds (ref 11.4–15.2)

## 2022-10-02 LAB — GLUCOSE, CAPILLARY: Glucose-Capillary: 73 mg/dL (ref 70–99)

## 2022-10-02 MED ORDER — LIDOCAINE VISCOUS HCL 2 % MT SOLN
OROMUCOSAL | Status: AC
  Filled 2022-10-02: qty 15

## 2022-10-02 MED ORDER — IOHEXOL 300 MG/ML  SOLN
50.0000 mL | Freq: Once | INTRAMUSCULAR | Status: AC | PRN
  Administered 2022-10-02: 5 mL

## 2022-10-02 MED ORDER — LIDOCAINE-EPINEPHRINE 1 %-1:100000 IJ SOLN
INTRAMUSCULAR | Status: AC
  Administered 2022-10-02: 9 mL via INTRADERMAL
  Filled 2022-10-02: qty 1

## 2022-10-02 NOTE — Procedures (Signed)
Interventional Radiology Procedure Note  Procedure: Unsuccessful replacement of gastrostomy tube  Findings: Please refer to procedural dictation for full description. Track unable to be recanalized with catheter and wire.  Contrast injection refluxed at skin, no visible patent track.    Complications: None immediate  Estimated Blood Loss: < 5 mL  Recommendations: Re-schedule for percutaneous gastrostomy tube placement with moderate sedation and appropriate hold of anticoagulant/antiplatelet.   Marliss Coots, MD

## 2022-10-02 NOTE — H&P (Signed)
Chief Complaint: Patient was seen in consultation today for dislodged g-tube and closure of tract at the request of Copland,Spencer  Referring Physician(s): Copland,Spencer  Supervising Physician: Simonne Come  Patient Status: [facility] - Out-pt  History of Present Illness: Michael Bright is a 66 y.o. male  with history of CVA, renal insufficiency, neurogenic bladder, and diabetes.  He reports having a g-tube for three years.  After tube was dislodged after a recent replacement on 11/26, the tract has closed.  Patient reports being primarily g-tube dependent as he is an aspiration risk since his stroke and is often unable to handle even his secretions.  In the two weeks without his g-tube he has attempted eating chips and drinking ensure, but reports this as difficult and not able to meet his nutritional needs. His PCP has referred him to IR for replacement.   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   Diabetes (HCC)    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 (CVA) 08/09/2019   Pneumonia    aspiration pneumonia   Renal insufficiency 05/30/2019   Sepsis with acute renal failure without septic shock (HCC)    Septic shock (HCC)    Status post insertion of percutaneous endoscopic gastrostomy (PEG) tube (HCC)    Stroke (HCC)    Urinary tract infection    Urinary tract infection without hematuria     Past Surgical History:  Procedure Laterality Date   CHOLECYSTECTOMY     IR GASTROSTOMY TUBE MOD SED  07/04/2019   IR [place] GASTRO/COLONIC TUBE PERCUT W/FLUORO  12/11/2021    Allergies: Patient has no known allergies.  Medications: Prior to Admission medications   Medication Sig Start Date End Date Taking? Authorizing Provider  aspirin 81 MG chewable tablet 81 mg by Per G  Tube route daily. 06/28/19  Yes [provider]  atorvastatin (LIPITOR) 40 MG tablet TAKE 1 TABLET (40 MG TOTAL) BY MOUTH AT BEDTIME. FOR CHOLESTEROL. 09/30/22  Yes Doreene Nest, NP  finasteride (PROSCAR) 5 MG tablet PLACE ONE TABLET IN GI TUBE ONCE DAILY FOR URINE FLOW. 09/30/22  Yes Doreene Nest, NP  fluticasone (FLONASE) 50 MCG/ACT nasal spray PLACE 1 SPRAY INTO BOTH NOSTRILS 2 (TWO) TIMES DAILY AS NEEDED FOR ALLERGIES OR RHINITIS. 02/27/20  Yes Doreene Nest, NP  gabapentin (NEURONTIN) 250 MG/5ML solution TAKE 2.5-5 MLS (125-250 MG TOTAL) BY MOUTH AT BEDTIME. FOR PAIN. 09/28/22  Yes Ranelle Oyster, MD  Accu-Chek FastClix Lancets MISC USE AS DIRECTED UP TO FOUR TIMES DAILY 09/26/19   Doreene Nest, NP  acetaminophen (TYLENOL) 325 MG tablet 325-650 mg by Per G Tube route as needed. 07/17/19   [provider]  chlorhexidine (PERIDEX) 0.12 % solution RINSE AND SPIT WITH 15 MLS BY MOUTH 2 (TWO) TIMES DAILY. 04/21/22   Ranelle Oyster, MD  glucose blood (ACCU-CHEK GUIDE) test strip USE AS DIRECTED UP TO FOUR TIMES DAILY 09/29/21   Doreene Nest, NP  glycopyrrolate (ROBINUL) 1 MG tablet Place 2 tablets (2 mg total) into feeding tube 3 (three) times daily. For oral secretions. 09/30/22   Doreene Nest, NP  guaifenesin (ROBITUSSIN) 100 MG/5ML syrup PLACE 10 MLS (200 MG TOTAL) INTO FEEDING TUBE 4 (FOUR) TIMES DAILY - WITH MEALS AND AT BEDTIME. 10/11/19   Vernona Rieger  K, NP  mouth rinse LIQD solution 15 mLs by Mouth Rinse route 5 (five) times daily. 07/17/19   Love, Evlyn Kanner, PA-C  Nutritional Supplements (FEEDING SUPPLEMENT, OSMOLITE 1.5 CAL,) LIQD Place 325 mLs into feeding tube 4 (four) times daily. Patient taking differently: Place 237 mLs into feeding tube 4 (four) times daily. 08/07/19   Love, Evlyn Kanner, PA-C  Sennosides (SENNA) 8.8 MG/5ML SYRP 5 mLs (8.8 mg total) by Feeding Tube route 2 (two) times daily as needed. For constipation 02/27/22   Doreene Nest, NP  Water For Irrigation, Sterile (FREE WATER) SOLN Place 200 mLs into feeding tube every 8 (eight) hours. Use filtered or bottled water (not distilled water) 08/08/19   Love, Evlyn Kanner, PA-C     Family History  Adopted: Yes    Social History   Socioeconomic History   Marital status: Married    Spouse name: Not on file   Number of children: Not on file   Years of education: Not on file   Highest education level: Not on file  Occupational History   Not on file  Tobacco Use   Smoking status: Never   Smokeless tobacco: Never  Vaping Use   Vaping Use: Never used  Substance and Sexual Activity   Alcohol use: Yes   Drug use: No   Sexual activity: Yes  Other Topics Concern   Not on file  Social History Narrative   Not on file   Social Determinants of Health   Financial Resource Strain: Low Risk  (08/12/2022)   Overall Financial Resource Strain (CARDIA)    Difficulty of Paying Living Expenses: Not hard at all  Food Insecurity: No Food Insecurity (08/12/2022)   Hunger Vital Sign    Worried About Running Out of Food in the Last Year: Never true    Ran Out of Food in the Last Year: Never true  Transportation Needs: No Transportation Needs (08/12/2022)   PRAPARE - Administrator, Civil Service (Medical): No    Lack of Transportation (Non-Medical): No  Physical Activity: Insufficiently Active (08/12/2022)   Exercise Vital Sign    Days of Exercise per Week: 7 days    Minutes of Exercise per Session: 10 min  Stress: No Stress Concern Present (08/12/2022)   [employer] of Occupational Health - Occupational Stress Questionnaire    Feeling of Stress : Not at all  Social Connections: Moderately Isolated (08/12/2022)   Social Connection and Isolation Panel [NHANES]    Frequency of Communication with Friends and Family: More than three times a week    Frequency of Social Gatherings with Friends and Family: More than three times a week    Attends  Religious Services: Never    Database administrator or Organizations: No    Attends Banker Meetings: Never    Marital Status: Married   Review of Systems  Constitutional: Negative.   HENT: Negative.    Eyes: Negative.   Respiratory:  Positive for cough and choking. Negative for shortness of breath.   Cardiovascular:  Positive for leg swelling.  Gastrointestinal: Negative.   Endocrine: Negative.   Musculoskeletal: Negative.   Allergic/Immunologic: Negative.   Neurological: Negative.   Psychiatric/Behavioral: Negative.      Vital Signs: BP 120/65   Pulse 79   Temp 98 F (36.7 C) (Oral)   Resp 17   Ht 6' (1.829 m)   Wt 266 lb (120.7 kg)   SpO2 100%   BMI 36.08 kg/m  Physical Exam Constitutional:      General: He is not in acute distress.    Appearance: He is ill-appearing.  HENT:     Head: Normocephalic and atraumatic.     Mouth/Throat:     Mouth: Mucous membranes are moist.     Pharynx: Oropharynx is clear.  Eyes:     Extraocular Movements: Extraocular movements intact.     Conjunctiva/sclera: Conjunctivae normal.  Cardiovascular:     Rate and Rhythm: Normal rate and regular rhythm.     Pulses: Normal pulses.     Heart sounds: Normal heart sounds.  Pulmonary:     Effort: Pulmonary effort is normal. No respiratory distress.     Breath sounds: Normal breath sounds.  Abdominal:     Palpations: Abdomen is soft.     Tenderness: There is no abdominal tenderness.     Comments: Wound from g-tube tract primarily healed over without any discharge on skin  Musculoskeletal:     Right lower leg: Edema present.     Left lower leg: Edema present.  Skin:    General: Skin is warm and dry.  Neurological:     General: No focal deficit present.     Mental Status: He is alert and oriented to person, place, and time.  Psychiatric:        Mood and Affect: Mood normal.        Behavior: Behavior normal.     Imaging: DG ABDOMEN PEG TUBE LOCATION  Result Date:  09/13/2022 CLINICAL DATA:  Dislodged G-tube EXAM: ABDOMEN - 1 VIEW COMPARISON:  Chest x-ray dated July 20, 2019 FINDINGS: Gastrostomy tube with contrast material is seen within the stomach. Visualized lungs are clear. IMPRESSION: Gastrostomy tube with contrast material seen in the stomach. Electronically Signed   By: Allegra Lai M.D.   On: 09/13/2022 18:10    Labs:  CBC: Recent Labs    10/02/22 0801  WBC 5.1  HGB 12.4*  HCT 37.2*  PLT 167    COAGS: Recent Labs    10/02/22 0801  INR 1.1    BMP: Recent Labs    09/29/22 1617  NA 142  K 4.2  CL 104  CO2 32  GLUCOSE 82  BUN 20  CALCIUM 8.9  CREATININE 0.61    LIVER FUNCTION TESTS: Recent Labs    09/29/22 1617  BILITOT 0.4  AST 18  ALT 19  ALKPHOS 60  PROT 7.1  ALBUMIN 3.9    Assessment and Plan:  Mr Zellars is a pleasant chronically ill appearing 66 year old man who is g-tube dependent for nutrition as he is a high aspiration risk secondary to stroke.  His g-tube was dislodged 2 weeks ago and presents today for replacement.  His only complaint is having secretions that are hard to swallow.  He is appropriately NPO.  Labs, imaging, history, and vitals reviewed.  Will proceed with planned g-tube new placement with expected discharge later today.  Thank you for this interesting consult.  I greatly enjoyed meeting Kahne Helfand and look forward to participating in their care.  A copy of this report was sent to the requesting provider on this date.  Electronically Signed: Sheliah Plane, PA 10/02/2022, 8:39 AM   I spent a total of 30 Minutes  in face to face in clinical consultation, greater than 50% of which was counseling/coordinating care for dislodged g-tube.

## 2022-10-07 ENCOUNTER — Other Ambulatory Visit: Payer: Self-pay | Admitting: Radiology

## 2022-10-07 DIAGNOSIS — Z01812 Encounter for preprocedural laboratory examination: Secondary | ICD-10-CM

## 2022-10-08 ENCOUNTER — Ambulatory Visit (HOSPITAL_COMMUNITY)
Admission: RE | Admit: 2022-10-08 | Discharge: 2022-10-08 | Disposition: A | Discharge status: Home / Self Care | Payer: BC Managed Care – PPO | Source: Ambulatory Visit | Attending: Physician Assistant | Admitting: Physician Assistant

## 2022-10-08 ENCOUNTER — Telehealth: Payer: Self-pay | Admitting: Primary Care

## 2022-10-08 ENCOUNTER — Other Ambulatory Visit: Payer: Self-pay

## 2022-10-08 DIAGNOSIS — Z8673 Personal history of transient ischemic attack (TIA), and cerebral infarction without residual deficits: Secondary | ICD-10-CM | POA: Diagnosis not present

## 2022-10-08 DIAGNOSIS — E119 Type 2 diabetes mellitus without complications: Secondary | ICD-10-CM | POA: Insufficient documentation

## 2022-10-08 DIAGNOSIS — Z01812 Encounter for preprocedural laboratory examination: Secondary | ICD-10-CM

## 2022-10-08 DIAGNOSIS — Z431 Encounter for attention to gastrostomy: Secondary | ICD-10-CM | POA: Diagnosis present

## 2022-10-08 DIAGNOSIS — I69391 Dysphagia following cerebral infarction: Secondary | ICD-10-CM | POA: Insufficient documentation

## 2022-10-08 HISTORY — PX: IR GASTROSTOMY TUBE MOD SED: IMG625

## 2022-10-08 LAB — CBC
HCT: 37.6 % — ABNORMAL LOW (ref 39.0–52.0)
Hemoglobin: 12.4 g/dL — ABNORMAL LOW (ref 13.0–17.0)
MCH: 30.8 pg (ref 26.0–34.0)
MCHC: 33 g/dL (ref 30.0–36.0)
MCV: 93.3 fL (ref 80.0–100.0)
Platelets: 177 10*3/uL (ref 150–400)
RBC: 4.03 MIL/uL — ABNORMAL LOW (ref 4.22–5.81)
RDW: 12.4 % (ref 11.5–15.5)
WBC: 4.9 10*3/uL (ref 4.0–10.5)
nRBC: 0 % (ref 0.0–0.2)

## 2022-10-08 LAB — PROTIME-INR
INR: 1 (ref 0.8–1.2)
Prothrombin Time: 13.5 seconds (ref 11.4–15.2)

## 2022-10-08 LAB — GLUCOSE, CAPILLARY: Glucose-Capillary: 70 mg/dL (ref 70–99)

## 2022-10-08 MED ORDER — MIDAZOLAM HCL 2 MG/2ML IJ SOLN
INTRAMUSCULAR | Status: AC
  Filled 2022-10-08: qty 2

## 2022-10-08 MED ORDER — SODIUM CHLORIDE 0.9 % IV SOLN: INTRAVENOUS | Status: DC

## 2022-10-08 MED ORDER — LIDOCAINE HCL 1 % IJ SOLN
INTRAMUSCULAR | Status: AC
  Filled 2022-10-08: qty 20

## 2022-10-08 MED ORDER — CEFAZOLIN SODIUM-DEXTROSE 2-4 GM/100ML-% IV SOLN
INTRAVENOUS | Status: AC | PRN
  Administered 2022-10-08: 3 g via INTRAVENOUS

## 2022-10-08 MED ORDER — FENTANYL CITRATE (PF) 100 MCG/2ML IJ SOLN
INTRAMUSCULAR | Status: AC | PRN
  Administered 2022-10-08: 50 ug via INTRAVENOUS

## 2022-10-08 MED ORDER — FENTANYL CITRATE (PF) 100 MCG/2ML IJ SOLN
INTRAMUSCULAR | Status: AC
  Filled 2022-10-08: qty 2

## 2022-10-08 MED ORDER — CEFAZOLIN IN SODIUM CHLORIDE 3-0.9 GM/100ML-% IV SOLN
3.0000 g | INTRAVENOUS | Status: DC
  Filled 2022-10-08: qty 100

## 2022-10-08 MED ORDER — MIDAZOLAM HCL 2 MG/2ML IJ SOLN
INTRAMUSCULAR | Status: AC | PRN
  Administered 2022-10-08: 1 mg via INTRAVENOUS

## 2022-10-08 MED ORDER — KETOROLAC TROMETHAMINE 15 MG/ML IJ SOLN: 15.0000 mg | Freq: Once | INTRAMUSCULAR | Status: DC

## 2022-10-08 NOTE — Telephone Encounter (Signed)
Wife called in to check on statue of order that was suppose to be sent in for replacement supplies for suction machine to: [employer] fax#:[phone]

## 2022-10-08 NOTE — Telephone Encounter (Signed)
Sent community message to our [facility] [employer] team who handles DME orders, awaiting a response.

## 2022-10-08 NOTE — Progress Notes (Signed)
Pt and spouse received d/c instructions, written and verbal. Denies any further questions or concerns. Pts procedure site remains free of any drainage or active bleeding. Dressing remains in place. Pt in stable condition and denies any acute pain or discomfort.

## 2022-10-08 NOTE — H&P (Addendum)
Chief Complaint: Patient was seen in consultation today for dislodged g-tube and closure of tract  Referring Physician(s): [place]  Supervising Physician: Corrie Mckusick  Patient Status: [facility] - Out-pt  History of Present Illness: Michael Bright is a 66 y.o. male with PMH significant for acute CVA, AKI, brainstem stroke syndrome, diabetes, and history of prior g-tube placement. The patient is g-tube dependent for nutrition following a stroke, and he is an aspiration risk with oral consumption.  The patient was last seen on 12/15 for an attempted replacement of existing gastrostomy tube through the existing tract, which was unsuccessful. The patient reports being unable to meet his nutritional needs through eating chips and drinking Ensure following the displacement of his g-tube. The patient has been referred to IR for new image-guided gastrostomy tube placement.  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   Diabetes ([place])    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 (CVA) 08/09/2019   Pneumonia    aspiration pneumonia   Renal insufficiency 05/30/2019   Sepsis with acute renal failure without septic shock (HCC)    Septic shock (HCC)    Status post insertion of percutaneous endoscopic gastrostomy (PEG) tube ([place])    Stroke ([place])    Urinary tract infection    Urinary tract infection without hematuria     Past Surgical History:  Procedure Laterality Date   CHOLECYSTECTOMY     IR GASTROSTOMY TUBE MOD SED  07/04/2019   IR [place] GASTRO/COLONIC TUBE PERCUT W/FLUORO  12/11/2021   IR [place] GASTRO/COLONIC TUBE PERCUT W/FLUORO  10/02/2022    Allergies: Patient has no allergy information on record.  Medications: Prior to Admission medications   Medication Sig Start  Date End Date Taking? Authorizing Provider  acetaminophen (TYLENOL) 325 MG tablet 325-650 mg by Per G Tube route as needed. 07/17/19  Yes [provider]  aspirin 81 MG chewable tablet 81 mg by Per G Tube route daily. 06/28/19  Yes [provider]  atorvastatin (LIPITOR) 40 MG tablet TAKE 1 TABLET (40 MG TOTAL) BY MOUTH AT BEDTIME. FOR CHOLESTEROL. 09/30/22  Yes Pleas Koch, NP  chlorhexidine (PERIDEX) 0.12 % solution RINSE AND SPIT WITH 15 MLS BY MOUTH 2 (TWO) TIMES DAILY. 04/21/22  Yes Meredith Staggers, MD  finasteride (PROSCAR) 5 MG tablet PLACE ONE TABLET IN GI TUBE ONCE DAILY FOR URINE FLOW. 09/30/22  Yes Pleas Koch, NP  gabapentin (NEURONTIN) 250 MG/5ML solution TAKE 2.5-5 MLS (125-250 MG TOTAL) BY MOUTH AT BEDTIME. FOR PAIN. 09/28/22  Yes Meredith Staggers, MD  mouth rinse LIQD solution 15 mLs by Mouth Rinse route 5 (five) times daily. 07/17/19  Yes Love, Ivan Anchors, PA-C  Nutritional Supplements (FEEDING SUPPLEMENT, OSMOLITE 1.5 CAL,) LIQD Place 325 mLs into feeding tube 4 (four) times daily. Patient taking differently: Place 237 mLs into feeding tube 4 (four) times daily. 08/07/19  Yes Love, Ivan Anchors, PA-C  Sennosides (SENNA) 8.8 MG/5ML SYRP 5 mLs (8.8 mg total) by Feeding Tube route 2 (two) times daily as needed. For constipation 02/27/22  Yes Pleas Koch, NP  Water For Irrigation, Sterile (FREE WATER) SOLN Place 200 mLs into feeding tube every 8 (eight) hours. Use filtered or bottled water (not distilled water) 08/08/19  Yes Love, Ivan Anchors, PA-C  Accu-Chek FastClix Lancets  MISC USE AS DIRECTED UP TO FOUR TIMES DAILY 09/26/19   Pleas Koch, NP  fluticasone (FLONASE) 50 MCG/ACT nasal spray PLACE 1 SPRAY INTO BOTH NOSTRILS 2 (TWO) TIMES DAILY AS NEEDED FOR ALLERGIES OR RHINITIS. 02/27/20   Pleas Koch, NP  glucose blood (ACCU-CHEK GUIDE) test strip USE AS DIRECTED UP TO FOUR TIMES DAILY 09/29/21   Pleas Koch, NP  glycopyrrolate (ROBINUL) 1 MG  tablet Place 2 tablets (2 mg total) into feeding tube 3 (three) times daily. For oral secretions. 09/30/22   Pleas Koch, NP  guaifenesin (ROBITUSSIN) 100 MG/5ML syrup PLACE 10 MLS (200 MG TOTAL) INTO FEEDING TUBE 4 (FOUR) TIMES DAILY - WITH MEALS AND AT BEDTIME. 10/11/19   Pleas Koch, NP     Family History  Adopted: Yes    Social History   Socioeconomic History   Marital status: Married    Spouse name: Not on file   Number of children: Not on file   Years of education: Not on file   Highest education level: Not on file  Occupational History   Not on file  Tobacco Use   Smoking status: Never   Smokeless tobacco: Never  Vaping Use   Vaping Use: Never used  Substance and Sexual Activity   Alcohol use: Yes   Drug use: No   Sexual activity: Yes  Other Topics Concern   Not on file  Social History Narrative   Not on file   Social Determinants of Health   Financial Resource Strain: Low Risk  (08/12/2022)   Overall Financial Resource Strain (CARDIA)    Difficulty of Paying Living Expenses: Not hard at all  Food Insecurity: No Food Insecurity (08/12/2022)   Hunger Vital Sign    Worried About Running Out of Food in the Last Year: Never true    [place] in the Last Year: Never true  Transportation Needs: No Transportation Needs (08/12/2022)   PRAPARE - Hydrologist (Medical): No    Lack of Transportation (Non-Medical): No  Physical Activity: Insufficiently Active (08/12/2022)   Exercise Vital Sign    Days of Exercise per Week: 7 days    Minutes of Exercise per Session: 10 min  Stress: No Stress Concern Present (08/12/2022)   [place]    Feeling of Stress : Not at all  Social Connections: Moderately Isolated (08/12/2022)   Social Connection and Isolation Panel [NHANES]    Frequency of Communication with Friends and Family: More than three times a week     Frequency of Social Gatherings with Friends and Family: More than three times a week    Attends Religious Services: Never    Marine scientist or Organizations: No    Attends Archivist Meetings: Never    Marital Status: Married    Review of Systems: A 12 point ROS discussed and pertinent positives are indicated in the HPI above.  All other systems are negative.  Review of Systems  Constitutional:  Negative for chills and fever.  Respiratory:  Negative for chest tightness and shortness of breath.   Cardiovascular:  Negative for chest pain and leg swelling.  Gastrointestinal:  Negative for diarrhea, nausea and vomiting.  Neurological:  Negative for dizziness and light-headedness.  Psychiatric/Behavioral:  Negative for confusion.     Vital Signs: BP 121/68   Pulse 74   Temp 98.2 F (36.8 C) (Oral)   Resp  18   Ht 6' (1.829 m)   Wt 266 lb (120.7 kg)   SpO2 98%   BMI 36.08 kg/m    Physical Exam Vitals reviewed.  Constitutional:      General: He is not in acute distress. HENT:     Mouth/Throat:     Mouth: Mucous membranes are moist.  Cardiovascular:     Rate and Rhythm: Normal rate and regular rhythm.     Pulses: Normal pulses.     Heart sounds: Normal heart sounds.  Pulmonary:     Effort: Pulmonary effort is normal.     Breath sounds: Normal breath sounds.  Abdominal:     General: Bowel sounds are normal.     Palpations: Abdomen is soft.     Tenderness: There is no abdominal tenderness.  Musculoskeletal:     Right lower leg: No edema.     Left lower leg: No edema.  Skin:    General: Skin is warm and dry.  Neurological:     Mental Status: He is alert and oriented to person, place, and time.  Psychiatric:        Mood and Affect: Mood normal.        Behavior: Behavior normal.     Imaging: IR Replc Gastro/Colonic Tube Percut W/Fluoro  Result Date: 10/02/2022 INDICATION: 66 year old male with history of stroke and chronic indwelling gastrostomy  tube last exchanged in February 2023 presenting with accidental tube removal nearly 2 weeks ago. The patient presented for new gastrostomy tube placement, however has not held antiplatelet medication appropriately. EXAM: FLUOROSCOPIC GUIDED REPLACEMENT OF GASTROSTOMY TUBE COMPARISON:  12/11/2021 MEDICATIONS: None. CONTRAST:  45m OMNIPAQUE IOHEXOL 300 MG/ML SOLN administered into the anterior abdominal wall subcutaneous track FLUOROSCOPY TIME:  2 mg COMPLICATIONS: None immediate. PROCEDURE: Informed written consent was obtained from the patient after a discussion of the risks, benefits and alternatives to treatment. Questions regarding the procedure were encouraged and answered. A timeout was performed prior to the initiation of the procedure. The upper abdomen and external portion of the existing gastrostomy tube track was prepped and draped in the usual sterile fashion, and a sterile drape was applied covering the operative field. Maximum barrier sterile technique with sterile gowns and gloves were used for the procedure. A timeout was performed prior to the initiation of the procedure. The existing gastrostomy tube site was injected with a small amount of contrast the an angled tip catheter. This was met with resistance and reflux along the skin surface. The catheter was manipulated to attempt to cannulate the subcutaneous track without success. The catheter was removed and a sterile bandage was applied. The patient tolerated the procedure well without immediate postprocedural complication. IMPRESSION: Unsuccessful fluoroscopic guided replacement of gastrostomy tube. PLAN: IR will arrange for additional appointment for gastrostomy tube placement as an outpatient with appropriate 5 day hold of aspirin. DRuthann Cancer MD [facility] Electronically Signed   By: DRuthann CancerM.D.   On: 10/02/2022 10:27   DG ABDOMEN PEG TUBE LOCATION  Result Date:  09/13/2022 CLINICAL DATA:  Dislodged G-tube EXAM: ABDOMEN - 1 VIEW COMPARISON:  Chest x-ray dated July 20, 2019 FINDINGS: Gastrostomy tube with contrast material is seen within the stomach. Visualized lungs are clear. IMPRESSION: Gastrostomy tube with contrast material seen in the stomach. Electronically Signed   By: LYetta GlassmanM.D.   On: 09/13/2022 18:10    Labs:  CBC: Recent Labs    10/02/22 0801 10/08/22 0736  WBC  5.1 4.9  HGB 12.4* 12.4*  HCT 37.2* 37.6*  PLT 167 177    COAGS: Recent Labs    10/02/22 0801 10/08/22 0736  INR 1.1 1.0    BMP: Recent Labs    09/29/22 1617  NA 142  K 4.2  CL 104  CO2 32  GLUCOSE 82  BUN 20  CALCIUM 8.9  CREATININE 0.61    LIVER FUNCTION TESTS: Recent Labs    09/29/22 1617  BILITOT 0.4  AST 18  ALT 19  ALKPHOS 60  PROT 7.1  ALBUMIN 3.9    TUMOR MARKERS: No results for input(s): "AFPTM", "CEA", "CA199", "CHROMGRNA" in the last 8760 hours.  Assessment and Plan:  Dislodged Gastrostomy Tube with Closure of Tract Michael Bright is a 66 yo male being seen today for image-guided gastrostomy tube placement following the dislodging and closing of the tract of a previously placed gastrostomy tube. The patient was seen on 12/15 for attempted replacement through the existing tract, but this was unsuccessful. Patient was subsequently scheduled for new image-guided gastrostomy placement on 10/08/22 with Dr Earleen Newport.  Risks and benefits image guided gastrostomy tube placement was discussed with the patient including, but not limited to the need for a barium enema during the procedure, bleeding, infection, peritonitis and/or damage to adjacent structures.  All of the patient's questions were answered, patient is agreeable to proceed.  Consent signed and in chart.   Thank you for this interesting consult.  I greatly enjoyed meeting Michael Bright and look forward to participating in their care.  A copy of this report was sent to  the requesting provider on this date.  Electronically Signed: Lura Em, PA 10/08/2022, 9:04 AM   I spent a total of   25 Minutes in face to face in clinical consultation, greater than 50% of which was counseling/coordinating care for dislodged g-tube and closure of tract.

## 2022-10-08 NOTE — Procedures (Signed)
Interventional Radiology Procedure Note  Procedure: Placement of percutaneous 35F pull-through gastrostomy tube. Complications: None Recommendations: - 1 hr dc home - OK to advance diet as tolerated and begin using tube with small meals - ice as needed - routine wound care Signed,   Yvone Neu. Loreta Ave, DO

## 2022-10-08 NOTE — Telephone Encounter (Signed)
I thought that I placed a DME order.  Can you contact the people in charge of this?

## 2022-10-14 NOTE — Telephone Encounter (Signed)
I have sent a follow up message to the [facility] [employer] team as I have not gotten a response from my initial inquiry. Awaiting a response.

## 2022-10-14 NOTE — Telephone Encounter (Signed)
Received message back from [facility] team that supplies were shipped on 10/09/2022.   Unable to reach patients wife, left a message for her to return call to office.

## 2022-10-15 NOTE — Telephone Encounter (Signed)
Called and notified patient that the supplies were shipped on 12.22.23 and they should be getting them soon.

## 2022-11-04 ENCOUNTER — Ambulatory Visit (INDEPENDENT_AMBULATORY_CARE_PROVIDER_SITE_OTHER): Payer: BC Managed Care – PPO | Admitting: Podiatry

## 2022-11-04 ENCOUNTER — Encounter: Payer: Self-pay | Admitting: Podiatry

## 2022-11-04 DIAGNOSIS — B351 Tinea unguium: Secondary | ICD-10-CM | POA: Diagnosis not present

## 2022-11-04 DIAGNOSIS — N289 Disorder of kidney and ureter, unspecified: Secondary | ICD-10-CM

## 2022-11-04 DIAGNOSIS — M79675 Pain in left toe(s): Secondary | ICD-10-CM

## 2022-11-04 DIAGNOSIS — E669 Obesity, unspecified: Secondary | ICD-10-CM

## 2022-11-04 DIAGNOSIS — M79674 Pain in right toe(s): Secondary | ICD-10-CM

## 2022-11-04 DIAGNOSIS — E1169 Type 2 diabetes mellitus with other specified complication: Secondary | ICD-10-CM

## 2022-11-04 NOTE — Progress Notes (Addendum)
This patient presents  to my office for at risk foot care.  This patient requires this care by a professional since this patient will be at risk due to having diabetes,CVA and renal insufficiency.  This patient is unable to cut nails himself since the patient cannot reach his nails.These nails are painful walking and wearing shoes. He presents to the office with caregiver. This patient presents for at risk foot care today.  General Appearance  Alert, conversant and in no acute stress.  Vascular  Dorsalis pedis are weakly  palpable  bilaterally. Posterior tibial pulses are absent due to swelling  Bilateral.  Capillary return is within normal limits  bilaterally. Cold feet  bilaterally.  Neurologic  Senn-Weinstein monofilament wire test diminished bilaterally. Muscle power within normal limits bilaterally.  Nails Thick disfigured discolored nails with subungual debris  from hallux to fifth toes bilaterally. No evidence of bacterial infection or drainage bilaterally.   Orthopedic  No limitations of motion  feet .  No crepitus or effusions noted.  No bony pathology or digital deformities noted.  Skin  normotropic skin with no porokeratosis noted bilaterally.  No signs of infections or ulcers noted.     Onychomycosis  Pain in right toes  Pain in left toes  Injury right hallux nail plate.  Consent was obtained for treatment procedures.   Mechanical debridement of nails 1-5  bilaterally performed with a nail nipper.  Filed with dremel without incident.    Return office visit   4   months                  Told patient to return for periodic foot care and evaluation due to potential at risk complications.   Gardiner Barefoot DPM

## 2022-11-05 ENCOUNTER — Other Ambulatory Visit: Payer: Self-pay | Admitting: Physical Medicine & Rehabilitation

## 2023-01-07 DIAGNOSIS — E119 Type 2 diabetes mellitus without complications: Secondary | ICD-10-CM | POA: Diagnosis not present

## 2023-01-07 DIAGNOSIS — R1312 Dysphagia, oropharyngeal phase: Secondary | ICD-10-CM | POA: Diagnosis not present

## 2023-01-07 DIAGNOSIS — Z931 Gastrostomy status: Secondary | ICD-10-CM | POA: Diagnosis not present

## 2023-01-07 DIAGNOSIS — I639 Cerebral infarction, unspecified: Secondary | ICD-10-CM | POA: Diagnosis not present

## 2023-01-07 DIAGNOSIS — R6889 Other general symptoms and signs: Secondary | ICD-10-CM | POA: Diagnosis not present

## 2023-01-08 DIAGNOSIS — G4733 Obstructive sleep apnea (adult) (pediatric): Secondary | ICD-10-CM | POA: Diagnosis not present

## 2023-01-11 DIAGNOSIS — R1314 Dysphagia, pharyngoesophageal phase: Secondary | ICD-10-CM | POA: Diagnosis not present

## 2023-02-01 DIAGNOSIS — H35033 Hypertensive retinopathy, bilateral: Secondary | ICD-10-CM | POA: Diagnosis not present

## 2023-02-01 DIAGNOSIS — H43823 Vitreomacular adhesion, bilateral: Secondary | ICD-10-CM | POA: Diagnosis not present

## 2023-02-01 DIAGNOSIS — E113413 Type 2 diabetes mellitus with severe nonproliferative diabetic retinopathy with macular edema, bilateral: Secondary | ICD-10-CM | POA: Diagnosis not present

## 2023-02-08 DIAGNOSIS — G4733 Obstructive sleep apnea (adult) (pediatric): Secondary | ICD-10-CM | POA: Diagnosis not present

## 2023-02-24 DIAGNOSIS — R600 Localized edema: Secondary | ICD-10-CM | POA: Diagnosis not present

## 2023-02-24 DIAGNOSIS — E1142 Type 2 diabetes mellitus with diabetic polyneuropathy: Secondary | ICD-10-CM | POA: Diagnosis not present

## 2023-02-24 DIAGNOSIS — E785 Hyperlipidemia, unspecified: Secondary | ICD-10-CM | POA: Diagnosis not present

## 2023-02-24 DIAGNOSIS — Z008 Encounter for other general examination: Secondary | ICD-10-CM | POA: Diagnosis not present

## 2023-02-24 DIAGNOSIS — N400 Enlarged prostate without lower urinary tract symptoms: Secondary | ICD-10-CM | POA: Diagnosis not present

## 2023-02-24 DIAGNOSIS — K5900 Constipation, unspecified: Secondary | ICD-10-CM | POA: Diagnosis not present

## 2023-02-24 DIAGNOSIS — E1159 Type 2 diabetes mellitus with other circulatory complications: Secondary | ICD-10-CM | POA: Diagnosis not present

## 2023-02-24 DIAGNOSIS — E1143 Type 2 diabetes mellitus with diabetic autonomic (poly)neuropathy: Secondary | ICD-10-CM | POA: Diagnosis not present

## 2023-02-24 DIAGNOSIS — I10 Essential (primary) hypertension: Secondary | ICD-10-CM | POA: Diagnosis not present

## 2023-02-24 DIAGNOSIS — E669 Obesity, unspecified: Secondary | ICD-10-CM | POA: Diagnosis not present

## 2023-02-24 DIAGNOSIS — I69391 Dysphagia following cerebral infarction: Secondary | ICD-10-CM | POA: Diagnosis not present

## 2023-02-24 DIAGNOSIS — Z6832 Body mass index (BMI) 32.0-32.9, adult: Secondary | ICD-10-CM | POA: Diagnosis not present

## 2023-02-24 DIAGNOSIS — Z5982 Transportation insecurity: Secondary | ICD-10-CM | POA: Diagnosis not present

## 2023-03-08 ENCOUNTER — Encounter: Payer: Self-pay | Admitting: Podiatry

## 2023-03-08 ENCOUNTER — Ambulatory Visit (INDEPENDENT_AMBULATORY_CARE_PROVIDER_SITE_OTHER): Payer: BC Managed Care – PPO | Admitting: Podiatry

## 2023-03-08 DIAGNOSIS — M79675 Pain in left toe(s): Secondary | ICD-10-CM | POA: Diagnosis not present

## 2023-03-08 DIAGNOSIS — B351 Tinea unguium: Secondary | ICD-10-CM

## 2023-03-08 DIAGNOSIS — N289 Disorder of kidney and ureter, unspecified: Secondary | ICD-10-CM

## 2023-03-08 DIAGNOSIS — E1165 Type 2 diabetes mellitus with hyperglycemia: Secondary | ICD-10-CM | POA: Diagnosis not present

## 2023-03-08 DIAGNOSIS — M79674 Pain in right toe(s): Secondary | ICD-10-CM

## 2023-03-08 NOTE — Progress Notes (Signed)
This patient presents  to my office for at risk foot care.  This patient requires this care by a professional since this patient will be at risk due to having diabetes,CVA and renal insufficiency.  This patient is unable to cut nails himself since the patient cannot reach his nails.These nails are painful walking and wearing shoes. He presents to the office with caregiver. This patient presents for at risk foot care today.  General Appearance  Alert, conversant and in no acute stress.  Vascular  Dorsalis pedis are weakly  palpable  bilaterally. Posterior tibial pulses are absent due to swelling  Bilateral.  Capillary return is within normal limits  bilaterally. Cold feet  bilaterally.  Neurologic  Senn-Weinstein monofilament wire test diminished right foot. LOPS WNL left foot. Muscle power within normal limits bilaterally.  Nails Thick disfigured discolored nails with subungual debris  from hallux to fifth toes bilaterally. No evidence of bacterial infection or drainage bilaterally.   Orthopedic  No limitations of motion  feet .  No crepitus or effusions noted.  HAV  B/L.  Skin  normotropic skin with no porokeratosis noted bilaterally.  No signs of infections or ulcers noted.     Onychomycosis  Pain in right toes  Pain in left toes  Injury right hallux nail plate.  Consent was obtained for treatment procedures.   Mechanical debridement of nails 1-5  bilaterally performed with a nail nipper.  Filed with dremel without incident. Patient is eligible for diabetic shoes.   Return office visit   4   months                  Told patient to return for periodic foot care and evaluation due to potential at risk complications.   Helane Gunther DPM

## 2023-03-17 DIAGNOSIS — G4733 Obstructive sleep apnea (adult) (pediatric): Secondary | ICD-10-CM | POA: Diagnosis not present

## 2023-03-22 ENCOUNTER — Telehealth: Payer: Self-pay | Admitting: Primary Care

## 2023-03-22 DIAGNOSIS — Z931 Gastrostomy status: Secondary | ICD-10-CM

## 2023-03-22 NOTE — Telephone Encounter (Signed)
Patient's spouse contacted the office, states the patient's G Tube has fallen apart and they are needing another one. Please advise, thank you

## 2023-03-22 NOTE — Telephone Encounter (Signed)
Patient's spouse returned call, requested a call back

## 2023-03-22 NOTE — Telephone Encounter (Signed)
Please call patient:  He actually just needs a referral to IR to have his tube replaced.  I do not see where he has been to the ED yet. Let's contact him and let him know that if he's only needing it replaced then we can go through the channels we've gone through in the past.

## 2023-03-22 NOTE — Telephone Encounter (Signed)
Called and spoke with patients wife, she states the patients G-tube has broken where the syringe attaches to push in the liquid. Patient wife states they usually get their supplies through [facility]. She states they do not have a general surgeon to see that placed the tube. Patients wife stated the patient has been trying eat food by mouth, per patients throat specialist they advised patient not to do this very much as the patient could easily aspirate.   Spoke with [facility], after a thorough chart review, did not see where patient saw general surgery for placement. Determined it would be best for patient to be seen in ER for immediate replacement of G tube. Advised patients wife of this, she verbalized understanding and stated they will go to ER today.

## 2023-03-22 NOTE — Telephone Encounter (Signed)
Unable to reach patient. Left voicemail to return call to our office.   

## 2023-03-23 NOTE — Telephone Encounter (Signed)
Called and spoke with patients wife, she states she has not taken patient anywhere yet due to her car being repaired, she must wait on that to be finished. She is agreeable to going through IR again and would like referral placed.

## 2023-03-23 NOTE — Telephone Encounter (Signed)
Please notify referrals team that I placed an imaging order to have his PEG tube replaced. We will need to get him scheduled ASAP.

## 2023-03-23 NOTE — Telephone Encounter (Signed)
Notified Michael Bright with referrals, she is looking into it.

## 2023-03-23 NOTE — Addendum Note (Signed)
Addended by: Doreene Nest on: 03/23/2023 01:55 PM   Modules accepted: Orders

## 2023-03-24 NOTE — Telephone Encounter (Signed)
Please make sure patient and his wife are aware.

## 2023-03-24 NOTE — Telephone Encounter (Signed)
Spoke with Asthyn with referrals, soonest appt they have available is late next week , she stated they are all booked up this week.  Please advise.

## 2023-03-24 NOTE — Telephone Encounter (Signed)
Patient was able to be worked in 03/25/23, patient and patients wife are aware

## 2023-03-25 ENCOUNTER — Ambulatory Visit (HOSPITAL_COMMUNITY)
Admission: RE | Admit: 2023-03-25 | Discharge: 2023-03-25 | Disposition: A | Discharge status: Home / Self Care | Payer: BLUE CROSS/BLUE SHIELD | Source: Ambulatory Visit | Attending: Primary Care | Admitting: Primary Care

## 2023-03-25 DIAGNOSIS — K9423 Gastrostomy malfunction: Secondary | ICD-10-CM | POA: Diagnosis present

## 2023-03-25 DIAGNOSIS — R1310 Dysphagia, unspecified: Secondary | ICD-10-CM | POA: Diagnosis not present

## 2023-03-25 DIAGNOSIS — Z931 Gastrostomy status: Secondary | ICD-10-CM

## 2023-03-25 HISTORY — PX: IR REPLC GASTRO/COLONIC TUBE PERCUT W/FLUORO: IMG2333

## 2023-03-25 MED ORDER — LIDOCAINE VISCOUS HCL 2 % MT SOLN
OROMUCOSAL | Status: AC
  Filled 2023-03-25: qty 15

## 2023-03-25 MED ORDER — IOHEXOL 300 MG/ML  SOLN
50.0000 mL | Freq: Once | INTRAMUSCULAR | Status: AC | PRN
  Administered 2023-03-25: 10 mL

## 2023-03-25 NOTE — Procedures (Signed)
Pre procedure Dx: Dysphagia; Fractured G-tube Post Procedure Dx: Same  Successful fluoroscopic conversion of disc retention G-tube to a 20 Fr, 2.5 cm stomal length low profile G-tube.  The gastrostomy tube may be used immediately for both meds and feedings.  EBL: Trace Complications: None immediate  Katherina Right, MD Pager #: [phone]

## 2023-04-07 ENCOUNTER — Ambulatory Visit (INDEPENDENT_AMBULATORY_CARE_PROVIDER_SITE_OTHER): Payer: Medicare HMO | Admitting: Primary Care

## 2023-04-07 ENCOUNTER — Encounter: Payer: Self-pay | Admitting: Primary Care

## 2023-04-07 VITALS — BP 134/80 | HR 76 | Temp 97.1°F | Ht 72.0 in | Wt 238.0 lb

## 2023-04-07 DIAGNOSIS — Z7689 Persons encountering health services in other specified circumstances: Secondary | ICD-10-CM

## 2023-04-07 DIAGNOSIS — E1165 Type 2 diabetes mellitus with hyperglycemia: Secondary | ICD-10-CM

## 2023-04-07 LAB — POCT GLYCOSYLATED HEMOGLOBIN (HGB A1C): Hemoglobin A1C: 5.5 % (ref 4.0–5.6)

## 2023-04-07 NOTE — Assessment & Plan Note (Signed)
Improved and well-controlled with A1c of 5.5 today!  Commended him on weight loss.  Continue off medications. Follow-up in 6 months.

## 2023-04-07 NOTE — Patient Instructions (Signed)
You will either be contacted via phone regarding your referral to dental appointment, or you may receive a letter on your MyChart portal from our referral team with instructions for scheduling an appointment. Please let us know if you have not been contacted by anyone within two weeks.  Please schedule a physical to meet with me in 6 months.   It was a pleasure to see you today!

## 2023-04-07 NOTE — Progress Notes (Signed)
Subjective:    Patient ID: Michael Bright, male    DOB: September 14, 1956, 67 y.o.   MRN: [id number]  HPI  Michael Bright is a very pleasant 67 y.o. male with a history of type 2 diabetes, hypertension, oropharyngeal dysphagia, OSA, CVA, feeding by G-tube, hyperlipidemia who presents today for follow-up of diabetes.  His wife joins [place] today.   Current medications include: None.  He is checking his blood glucose 1 times daily and is getting readings of:  AM fasting: 80's-90's.   Last A1C: 6.0 in December 2023, 5.5 today Last Eye Exam: Due Last Foot Exam: Up-to-date Pneumonia Vaccination: 2023 Urine Microalbumin: UTD Statin: Atorvastatin  Dietary changes since last visit: None.    Exercise: None.   BP Readings from Last 3 Encounters:  04/07/23 134/80  10/08/22 113/67  10/02/22 120/65   Wt Readings from Last 3 Encounters:  04/07/23 238 lb (108 kg)  10/08/22 266 lb (120.7 kg)  10/02/22 266 lb (120.7 kg)       Review of Systems  Constitutional:  Negative for fever.  Cardiovascular:  Negative for chest pain.  Gastrointestinal:  Negative for constipation.  Neurological:  Positive for numbness.         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   Diabetes (HCC)    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 (CVA) 08/09/2019   Pneumonia    aspiration pneumonia   Renal insufficiency 05/30/2019   Sepsis with acute renal failure without septic shock (HCC)    Septic shock (HCC)    Status post insertion of percutaneous endoscopic gastrostomy (PEG) tube (HCC)    Stroke (HCC)    Urinary tract infection    Urinary tract infection without hematuria     Social History   Socioeconomic History   Marital status: Married    Spouse name: Not on file   Number of  children: Not on file   Years of education: Not on file   Highest education level: Not on file  Occupational History   Not on file  Tobacco Use   Smoking status: Never   Smokeless tobacco: Never  Vaping Use   Vaping Use: Never used  Substance and Sexual Activity   Alcohol use: Yes   Drug use: No   Sexual activity: Yes  Other Topics Concern   Not on file  Social History Narrative   Not on file   Social Determinants of Health   Financial Resource Strain: Low Risk  (08/12/2022)   Overall Financial Resource Strain (CARDIA)    Difficulty of Paying Living Expenses: Not hard at all  Food Insecurity: No Food Insecurity (08/12/2022)   Hunger Vital Sign    Worried About Running Out of Food in the Last Year: Never true    Ran Out of Food in the Last Year: Never true  Transportation Needs: No Transportation Needs (08/12/2022)   PRAPARE - Administrator, Civil Service (Medical): No    Lack of Transportation (Non-Medical): No  Physical Activity: Insufficiently Active (08/12/2022)   Exercise Vital Sign    Days of Exercise per Week: 7 days    Minutes of Exercise per Session: 10 min  Stress: No Stress Concern Present (08/12/2022)   [employer] of Occupational Health - Occupational Stress Questionnaire    Feeling  of Stress : Not at all  Social Connections: Moderately Isolated (08/12/2022)   Social Connection and Isolation Panel [NHANES]    Frequency of Communication with Friends and Family: More than three times a week    Frequency of Social Gatherings with Friends and Family: More than three times a week    Attends Religious Services: Never    Database administrator or Organizations: No    Attends Banker Meetings: Never    Marital Status: Married  Catering manager Violence: Not At Risk (08/12/2022)   Humiliation, Afraid, Rape, and Kick questionnaire    Fear of Current or Ex-Partner: No    Emotionally Abused: No    Physically Abused: No    Sexually  Abused: No    Past Surgical History:  Procedure Laterality Date   CHOLECYSTECTOMY     IR GASTROSTOMY TUBE MOD SED  07/04/2019   IR GASTROSTOMY TUBE MOD SED  10/08/2022   IR REPLC GASTRO/COLONIC TUBE PERCUT W/FLUORO  12/11/2021   IR REPLC GASTRO/COLONIC TUBE PERCUT W/FLUORO  10/02/2022   IR REPLC GASTRO/COLONIC TUBE PERCUT W/FLUORO  03/25/2023    Family History  Adopted: Yes    Not on File  Current Outpatient Medications on File Prior to Visit  Medication Sig Dispense Refill   Accu-Chek FastClix Lancets MISC USE AS DIRECTED UP TO FOUR TIMES DAILY 102 each 5   acetaminophen (TYLENOL) 325 MG tablet 325-650 mg by Per G Tube route as needed.     aspirin 81 MG chewable tablet 81 mg by Per G Tube route daily.     atorvastatin (LIPITOR) 40 MG tablet TAKE 1 TABLET (40 MG TOTAL) BY MOUTH AT BEDTIME. FOR CHOLESTEROL. 90 tablet 3   chlorhexidine (PERIDEX) 0.12 % solution RINSE AND SPIT WITH 15 MLS BY MOUTH 2 (TWO) TIMES DAILY. 473 mL 6   finasteride (PROSCAR) 5 MG tablet PLACE ONE TABLET IN GI TUBE ONCE DAILY FOR URINE FLOW. 90 tablet 3   fluticasone (FLONASE) 50 MCG/ACT nasal spray PLACE 1 SPRAY INTO BOTH NOSTRILS 2 (TWO) TIMES DAILY AS NEEDED FOR ALLERGIES OR RHINITIS. 16 mL 2   gabapentin (NEURONTIN) 250 MG/5ML solution TAKE 2.5-5 MLS (125-250 MG TOTAL) BY MOUTH AT BEDTIME. FOR PAIN. 150 mL 6   glucose blood (ACCU-CHEK GUIDE) test strip USE AS DIRECTED UP TO FOUR TIMES DAILY 400 strip 3   glycopyrrolate (ROBINUL) 1 MG tablet Place 2 tablets (2 mg total) into feeding tube 3 (three) times daily. For oral secretions. 540 tablet 3   guaifenesin (ROBITUSSIN) 100 MG/5ML syrup PLACE 10 MLS (200 MG TOTAL) INTO FEEDING TUBE 4 (FOUR) TIMES DAILY - WITH MEALS AND AT BEDTIME. 948 mL 0   mouth rinse LIQD solution 15 mLs by Mouth Rinse route 5 (five) times daily. 946 mL 1   Nutritional Supplements (FEEDING SUPPLEMENT, OSMOLITE 1.5 CAL,) LIQD Place 325 mLs into feeding tube 4 (four) times daily. (Patient taking  differently: Place 237 mLs into feeding tube 4 (four) times daily.)  0   Sennosides (SENNA) 8.8 MG/5ML SYRP 5 mLs (8.8 mg total) by Feeding Tube route 2 (two) times daily as needed. For constipation 900 mL 1   Water For Irrigation, Sterile (FREE WATER) SOLN Place 200 mLs into feeding tube every 8 (eight) hours. Use filtered or bottled water (not distilled water)     No current facility-administered medications on file prior to visit.    BP 134/80   Pulse 76   Temp (!) 97.1 F (36.2 C) (Temporal)  Ht 6' (1.829 m)   Wt 238 lb (108 kg)   SpO2 98%   BMI 32.28 kg/m  Objective:   Physical Exam Cardiovascular:     Rate and Rhythm: Normal rate and regular rhythm.  Pulmonary:     Effort: Pulmonary effort is normal.     Breath sounds: No wheezing or rales.  Abdominal:     General: Bowel sounds are normal.     Palpations: Abdomen is soft.     Tenderness: There is no abdominal tenderness.  Musculoskeletal:     Cervical back: Neck supple.  Skin:    General: Skin is warm and dry.     Comments: G-tube site appears normal  Neurological:     Mental Status: He is alert and oriented to person, place, and time.           Assessment & Plan:  Controlled type 2 diabetes mellitus with hyperglycemia, without long-term current use of insulin (HCC) Assessment & Plan: Improved and well-controlled with A1c of 5.5 today!  Commended him on weight loss.  Continue off medications. Follow-up in 6 months.  Orders: -     POCT glycosylated hemoglobin (Hb A1C)  Need for referral to dentistry for poor dentition -     Ambulatory referral to Dentistry        Doreene Nest, NP

## 2023-04-08 ENCOUNTER — Encounter: Payer: Self-pay | Admitting: *Deleted

## 2023-04-14 DIAGNOSIS — Z931 Gastrostomy status: Secondary | ICD-10-CM | POA: Diagnosis not present

## 2023-04-14 DIAGNOSIS — E119 Type 2 diabetes mellitus without complications: Secondary | ICD-10-CM | POA: Diagnosis not present

## 2023-04-14 DIAGNOSIS — R6889 Other general symptoms and signs: Secondary | ICD-10-CM | POA: Diagnosis not present

## 2023-04-14 DIAGNOSIS — R1312 Dysphagia, oropharyngeal phase: Secondary | ICD-10-CM | POA: Diagnosis not present

## 2023-04-14 DIAGNOSIS — I639 Cerebral infarction, unspecified: Secondary | ICD-10-CM | POA: Diagnosis not present

## 2023-04-20 DIAGNOSIS — Z961 Presence of intraocular lens: Secondary | ICD-10-CM | POA: Diagnosis not present

## 2023-04-20 DIAGNOSIS — E113413 Type 2 diabetes mellitus with severe nonproliferative diabetic retinopathy with macular edema, bilateral: Secondary | ICD-10-CM | POA: Diagnosis not present

## 2023-04-20 DIAGNOSIS — H35033 Hypertensive retinopathy, bilateral: Secondary | ICD-10-CM | POA: Diagnosis not present

## 2023-04-20 DIAGNOSIS — H43823 Vitreomacular adhesion, bilateral: Secondary | ICD-10-CM | POA: Diagnosis not present

## 2023-04-28 ENCOUNTER — Encounter: Payer: Self-pay | Admitting: Adult Health

## 2023-04-28 ENCOUNTER — Ambulatory Visit: Payer: Medicare HMO | Admitting: Adult Health

## 2023-04-28 VITALS — BP 119/67 | HR 76 | Ht 72.0 in | Wt 245.0 lb

## 2023-04-28 DIAGNOSIS — G464 Cerebellar stroke syndrome: Secondary | ICD-10-CM | POA: Diagnosis not present

## 2023-04-28 DIAGNOSIS — G4733 Obstructive sleep apnea (adult) (pediatric): Secondary | ICD-10-CM

## 2023-04-28 NOTE — Patient Instructions (Addendum)
Continue nightly use of CPAP and continue to follow with [facility] for any needed supplies or concerns  Continue aspirin 81 mg daily  and lipitor  for secondary stroke prevention  Continue to follow up with PCP regarding cholesterol, diabetes and blood pressure management  Maintain strict control of hypertension with blood pressure goal below 130/90, diabetes with hemoglobin A1c goal below 7.0 % and cholesterol with LDL cholesterol (bad cholesterol) goal below 70 mg/dL.   Signs of a Stroke? Follow the BEFAST method:  Balance Watch for a sudden loss of balance, trouble with coordination or vertigo Eyes Is there a sudden loss of vision in one or both eyes? Or double vision?  Face: Ask the person to smile. Does one side of the face droop or is it numb?  Arms: Ask the person to raise both arms. Does one arm drift downward? Is there weakness or numbness of a leg? Speech: Ask the person to repeat a simple phrase. Does the speech sound slurred/strange? Is the person confused ? Time: If you observe any of these signs, call 911.    Followup in the future with me in 1 year or call earlier if needed       Thank you for coming to see [place] at [facility]. I hope we have been able to provide you high quality care today.  You may receive a patient satisfaction survey over the next few weeks. We would appreciate your feedback and comments so that we may continue to improve ourselves and the health of our patients.

## 2023-04-28 NOTE — Progress Notes (Signed)
[facility] [address] [place]. [place] [address] [phone]       OFFICE FOLLOW UP NOTE  [name] Date of Birth:  1956/07/04 Medical Record Number:  [id number]    Reason for visit: Stroke and CPAP follow-up    CHIEF COMPLAINT:  Chief Complaint  Patient presents with   Follow-up    Rm 8, here with wife Michael Bright follow up on OSA with CPAP. Pt states he is using CPAP every night, states he sleeps between 4-8 hrs,       HPI:  Update 04/28/2023 JM: Patient returns for yearly CPAP and stroke follow-up accompanied by his wife.  Doing well from stroke standpoint.  No new stroke/TIA symptoms.  Continued poststroke dysarthria, dysphagia requiring PEG tube and RUE numbness stable.  Denies any recent falls with use of RW.  Continues on aspirin and atorvastatin.  Closely follows with PCP for stroke risk factor management.  Continued use of CPAP with download report noted below showing satisfactory usage and residual AHI 5.2.  Continues to tolerate well. Does have leaks occasionally but will adjust mask and will resolve.  No concerns regarding CPAP.         History provided for reference purposes only Update 04/27/2022 JM: Patient returns for yearly CPAP and stroke follow-up.    Stable from stroke standpoint, no new stroke/TIA symptoms.  Residual dysarthria, dysphagia with use of PEG tube and RUE numbness stable. Use of rollator walker, no recent falls.  Compliant on aspirin atorvastatin, denies side effects.  Routinely follows with PCP for stroke risk factor management. Does mention issues with BLE swelling and wounds currently being followed by PCP.   Doing well on CPAP.  Review of compliance report as below showing good usage and optimal residual AHI.  Tolerating CPAP well. Is up to date on supplies.  ESS 10/24.        Update 04/24/2021 JM: [name] returns for 24-month stroke and CPAP follow-up accompanied by his wife  Stable from  stroke standpoint without new stroke/TIA symptoms.  Residual dysphagia, dysarthria and RUE numbness stable.  Currently on full liquid diet and use of PEG tube for main nutrition and meds.  Continues to use rolling walker for ambulation-denies any recent falls.  Compliant on aspirin and atorvastatin without associated side effects.  Blood pressure today 120/69.  Recent A1c 5.4.  Continues to do well on CPAP.  He did recently receive a new mask but was sent to a size medium when he usually wears a large and has been having difficulty with leaks since then - they have no attempted to reach out to DME company yet to notify them.  He has no other CPAP related concerns.  Epworth Sleepiness Scale 6         Update 10/23/2020 JM: [name] returns for initial CPAP compliance visit accompanied by his wife. He was previously seen on 08/15/2020 for stroke follow-up.   He reports doing well on CPAP tolerating without difficulty.  Reports improvement of his sleep as well as increased energy during the day.  He continues to follow with [facility] for any needed supplies or CPAP concerns.   Review of compliance report from 09/22/2020 -10/21/2020 shows 29 out of 30 usage days and 24 days greater than 4 hours for 80% compliance.  Average usage 5 hours and 30 minutes.  Residual AHI 3.2 with min pressure 80 and max pressure 16 with a EPR level 2.  Leaks in the 95th  percentile 33.6.  Pressure in the 95th percentile 9.4.   Update 08/15/2020 JM: [name] returns for stroke follow-up accompanied by his son.  Reports residual deficits of RUE numbness and dysarthria which has been improving and dysphagia with limited oral intake and primary use of G tube.  He was found to have esophageal strictures on EGD after SLP referred him to GI.  These were stretched on 8/16 but has not noticed much benefit.  He plans on undergoing barium swallow study in the near future and continues to follow closely with ENT and speech therapy. Denies  new or worsening stroke/TIA symptoms.  Remains on aspirin and atorvastatin for secondary stroke prevention without side effects.  Blood pressure today 157/88.  He does report bilateral lower extremity paresthesias likely diabetic neuropathy interfering with ambulation and sleep.  Currently on gabapentin 125-250mg  nightly without benefit.  Denies any side effects on current dose.  Establish care with [facility] sleep clinic [name] in 04/2020 undergoing HST confirming severe OSA and was provided with a new CPAP machine and supplies.  He received his CPAP machine approximately 2 weeks ago and reports using nightly tolerating well without difficulty.  No further concerns at this time.  Update 04/07/2020 JM: [name] is being seen for stroke follow-up accompanied by his wife.  Residual deficits of RUE altered sensation and dysphagia.  Continues to work with outpatient therapy with ongoing improvement.  Continues to receive primary nutrition via G-tube but is able to eat soft food and drink regular liquids without difficulty.  He continues to experience acid reflux type symptoms and apparently was not able to initiate pantoprazole as he reports insurance declined coverage despite PCP recently providing refill.  Continues on aspirin 81 mg daily and atorvastatin 40 mg daily.  Blood pressure today 136/78.  He requests establishing care with new sleep provider as he is due for new CPAP machine and does not have current provider.  No further concerns at this time.  Update 01/01/2020 JM: [name] is a 67 year old male who is being seen today, 01/01/2020, for stroke follow-up accompanied by his wife.  He has been stable from a stroke standpoint with residual deficits of right hemiparesis, RUE paresthesias and dysphagia s/p PEG tube. Continues to work with outpatient therapy with ongoing improvement.  Continues to use Rollator walker at all times and denies any recent falls.  Ongoing use of PEG tube but has been advanced to NTL  without difficulty.  He does endorse increased acid reflux symptoms over the past month which is also required increased suctioning.  He does report use of tomato juice during therapy.  Continues cyclic tube feeding and recently evaluated nutrition who changed tube feed formulation due to constipation and weight gain -has not yet started new feeding.  Continues on gabapentin 250 mg nightly for RUE pain but endorses ongoing constant warm sensation.  Completed 3 months DAPT and continues on aspirin alone without bleeding or bruising.  Continues on atorvastatin 40 mg daily without myalgias.  Recent A1c 5.7.  Continues to follow PCP for HTN, HLD and DM management.  Blood pressure today 136/76. No further concerns at this time  Initial visit 09/11/2019 JM: Mr. Floren is a 67 year old male who is being seen today for hospital follow-up accompanied by his wife.  He has been doing well since discharge with residual right-sided weakness, imbalance and dysphagia.  He does endorse ongoing improvement with home health therapies and plans on transitioning to outpatient therapies once completed.  He does  endorse occasional sharp stabbing sensation with flexion of his left wrist and will resolve with extension.  Denies radiating pain in the wrist or fingers.  He will also experience intermittent shock type sensations on right upper and lower extremity.  He was started on gabapentin 250 mg at bedtime with little improvement.  He continues to use a rolling walker for ambulation as well as AFO brace and denies any recent falls.  Remains n.p.o. and receives all nutrition, hydration and medications through PEG tube.  He does endorse ongoing difficulty with excretions and does have suction device at home for assistance.  Improvement of neurogenic bladder and has been voiding independently.  PCP recently discontinued citalopram as he did not have residual depression/anxiety symptoms.  Depression/anxiety has been stable at this time.  He  has continued on aspirin and Plavix without bleeding or bruising.  Continues on atorvastatin without myalgias.  Recent lipid panel obtained by PCP with improvement of LDL 55.  Blood pressure today satisfactory at 125/95.  Glucose levels greatly improved with recent A1c 7.3.  No further concerns at this time.  Stroke admission 05/30/2019: Mr. Abriel Geesey is a 66 y.o. male with hx DB who stopped meds 10 yrs ago and morbid obesity presenting to [facility] ED with dysarthria, left facial droop, double vision and gait unsteadiness since Sunday 8/9.  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  CTA head/neck negative LVO with diffuse intracranial stenosis moderate supraclinoid bilateral ICAs and mild stenosis left VA origin.  2D echo normal EF without cardiac source of embolus identified.  LDL 135.  A1c 10.6.  Recommended DAPT for 3 months then aspirin alone.  Blood pressure stable during admission without prior history of HTN.  Initiated atorvastatin 40 mg daily.  New diagnosis of DM and recommended close PCP follow-up.  Other stroke is factors include morbid obesity and EtOH but no prior history of stroke.  Hospital course complicated by hypoxemic respiratory failure secondary to medullary stroke intubated on 8/15 with trach placement on 8/21, possible aspiration pneumonia secondary to medullary stroke and dysphagia secondary to medullary stroke with placement of PEG tube on 9/15.  He was discharged to CIR on 06/27/2019 for ongoing therapy with residual deficits.  During admission to CIR, he had improvement of respiratory status and was decannulated on 9/22.  Difficulty with neurogenic bladder requiring intermittent caths and discharged with recommendation on following up with urology outpatient.  He was discharged home in stable condition with recommendation of home health therapy on 07/17/2019.  He unfortunately was readmitted on 07/20/2019 with septic shock due  to Klebsiella pneumoniae bacteremia with UTI and hypoxic respiratory failure bilateral lung infiltrates due to rhinovirus.  Required intubation briefly and treated with broad-spectrum antibiotics eventually narrowed with recommendation of 2-week total antibiotic course.  Hospital course significant for AKI with abnormal LFTs due to shock liver, intermittent hypoxic episode as well as significant oral secretions with dysphonia.  He was discharged back to CIR for ongoing therapy due to debility.  Ongoing deficits of severe pharyngeal dysphagia and mild memory deficits affecting recall but showed improvement of activity tolerance, balance and postural control as well as ability to compensate for deficits and was discharged home with home health therapy on 08/08/2019.    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   Diabetes (HCC)    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 (CVA) 08/09/2019   Pneumonia    aspiration pneumonia   Renal insufficiency 05/30/2019   Sepsis with acute renal failure without septic shock (HCC)    Septic shock (HCC)    Status post insertion of percutaneous endoscopic gastrostomy (PEG) tube (HCC)    Stroke (HCC)    Urinary tract infection    Urinary tract infection without hematuria     PSH:  Past Surgical History:  Procedure Laterality Date   CHOLECYSTECTOMY     IR GASTROSTOMY TUBE MOD SED  07/04/2019   IR GASTROSTOMY TUBE MOD SED  10/08/2022   IR REPLC GASTRO/COLONIC TUBE PERCUT W/FLUORO  12/11/2021   IR REPLC GASTRO/COLONIC TUBE PERCUT W/FLUORO  10/02/2022   IR REPLC GASTRO/COLONIC TUBE PERCUT W/FLUORO  03/25/2023    Social History:  Social  History   Socioeconomic History   Marital status: Married    Spouse name: Not on file   Number of children: Not on file   Years of education: Not on file   Highest education level: Not on file  Occupational History   Not on file  Tobacco Use   Smoking status: Never   Smokeless tobacco: Never  Vaping Use   Vaping Use: Never used  Substance and Sexual Activity   Alcohol use: Yes   Drug use: No   Sexual activity: Yes  Other Topics Concern   Not on file  Social History Narrative   Not on file   Social Determinants of Health   Financial Resource Strain: Low Risk  (08/12/2022)   Overall Financial Resource Strain (CARDIA)    Difficulty of Paying Living Expenses: Not hard at all  Food Insecurity: No Food Insecurity (08/12/2022)   Hunger Vital Sign    Worried About Radiation protection practitioner of Food in the Last Year: Never true    Ran Out of Food in the Last Year: Never true  Transportation Needs: No Transportation Needs (08/12/2022)   PRAPARE - Administrator, Civil Service (Medical): No    Lack of Transportation (Non-Medical): No  Physical Activity: Insufficiently Active (08/12/2022)   Exercise Vital Sign    Days of Exercise per Week: 7 days    Minutes of Exercise per Session: 10 min  Stress: No Stress Concern Present (08/12/2022)   [employer] of Occupational Health - Occupational Stress Questionnaire    Feeling of Stress : Not at all  Social Connections: Moderately Isolated (08/12/2022)   Social Connection and Isolation Panel [NHANES]    Frequency of Communication with Friends and Family: More than three times a week    Frequency of Social Gatherings with Friends and Family: More than three times a week    Attends Religious Services: Never    Database administrator or Organizations: No    Attends Banker Meetings: Never    Marital Status: Married  Catering manager Violence: Not At Risk (08/12/2022)   Humiliation, Afraid, Rape, and Kick questionnaire     Fear of Current or Ex-Partner: No    Emotionally Abused: No    Physically Abused: No    Sexually Abused: No    Family History:  Family History  Adopted: Yes    Medications:   Current Outpatient Medications on File Prior to Visit  Medication Sig Dispense Refill   Accu-Chek FastClix Lancets MISC USE AS DIRECTED UP TO FOUR TIMES DAILY 102 each 5   acetaminophen (TYLENOL) 325 MG tablet 325-650 mg by Per G Tube route as needed.     aspirin 81 MG chewable tablet 81 mg by Per G Tube route daily.     atorvastatin (LIPITOR) 40 MG tablet TAKE 1 TABLET (40 MG TOTAL) BY MOUTH AT BEDTIME. FOR CHOLESTEROL. 90 tablet 3   chlorhexidine (PERIDEX) 0.12 % solution RINSE AND SPIT WITH 15 MLS BY MOUTH 2 (TWO) TIMES DAILY. 473 mL 6   finasteride (PROSCAR) 5 MG tablet PLACE ONE TABLET IN GI TUBE ONCE DAILY FOR URINE FLOW. 90 tablet 3   fluticasone (FLONASE) 50 MCG/ACT nasal spray PLACE 1 SPRAY INTO BOTH NOSTRILS 2 (TWO) TIMES DAILY AS NEEDED FOR ALLERGIES OR RHINITIS. 16 mL 2   gabapentin (NEURONTIN) 250 MG/5ML solution TAKE 2.5-5 MLS (125-250 MG TOTAL) BY MOUTH AT BEDTIME. FOR PAIN. 150 mL 6   glucose blood (ACCU-CHEK GUIDE) test strip USE AS DIRECTED UP TO FOUR TIMES DAILY 400 strip 3   glycopyrrolate (ROBINUL) 1 MG tablet Place 2 tablets (2 mg total) into feeding tube 3 (three) times daily. For oral secretions. 540 tablet 3   guaifenesin (ROBITUSSIN) 100 MG/5ML syrup PLACE 10 MLS (200 MG TOTAL) INTO FEEDING TUBE 4 (FOUR) TIMES DAILY - WITH MEALS AND AT BEDTIME. 948 mL 0   mouth rinse LIQD solution 15 mLs by Mouth Rinse route 5 (five) times daily. 946 mL 1   Nutritional Supplements (FEEDING SUPPLEMENT, OSMOLITE 1.5 CAL,) LIQD Place 325 mLs into feeding tube 4 (four) times daily. (Patient taking differently: Place 237 mLs into feeding tube 4 (four) times daily.)  0   Sennosides (SENNA) 8.8 MG/5ML SYRP 5 mLs (8.8 mg total) by Feeding Tube route 2 (two) times daily as needed. For constipation 900 mL 1   Water  For Irrigation, Sterile (FREE WATER) SOLN Place 200 mLs into feeding tube every 8 (eight) hours. Use filtered or bottled water (not distilled water)     No current facility-administered medications on file prior to visit.    Allergies:  Not on File   Physical Exam  Vitals:   04/28/23 1431  BP: 119/67  Pulse: 76  Weight: 245 lb (111.1 kg)  Height: 6' (1.829 m)   Body mass index is 33.23 kg/m. No results found.  General: Obese very pleasant middle-age African-American male, seated, in no evident distress Head: head normocephalic and atraumatic.   Neck: supple with no carotid or supraclavicular bruits Cardiovascular: regular rate and rhythm, no murmurs Musculoskeletal: no deformity Skin:  no rash/petichiae; PEG tube intact Vascular:  Normal pulses all extremities   Neurologic Exam Mental Status: Awake and fully alert. Mild dysarthria. Oriented to place and time. Recent and remote memory intact. Attention span, concentration and fund of knowledge appropriate. Mood and affect appropriate.  Cranial Nerves: Pupils equal, briskly reactive to light. Extraocular movements full without nystagmus. Visual fields full to confrontation. Hearing intact. Facial sensation intact.  Slight right lower facial weakness Motor: Normal bulk and tone.  Full strength in all tested extremities. Sensory.:  Diminished light touch sensation RUE proximally and decreased sensory RLE distally Coordination: Rapid alternating movements normal in all extremities. Finger-to-nose and heel-to-shin performed accurately bilaterally. Gait and Station: Arises from chair with mild difficulty. Stance is normal. Gait demonstrates  broad-based gait with slightly decreased stride length and step height and mild imbalance with use of Rollator walker Reflexes:  1+ and symmetric. Toes downgoing.       ASSESSMENT/PLAN: Michael Bright is a 67 y.o. 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 Vascular risk factors include severe OSA on CPAP, HTN, HLD, DM, EtOH use and morbid obesity.  Prolonged hospital course 05/2019 due to complications including hypoxic respiratory failure with placement of tracheostomy now removed, PEG tube due to dysphagia post stroke, neurogenic bladder post stroke resolved and aspiration pneumonia.  He returned shortly after CIR discharge due to septic shock requiring additional inpatient rehab stay.       Left lateral medullary stroke: Remains stable from stroke standpoint.  Discussed use of rollator walker at all times for fall prevention.  Continue aspirin and atorvastatin 40 mg daily for secondary stroke prevention.  Continue to follow closely with PCP for aggressive stroke risk factor management including HTN with BP goal<130/90, HLD with LDL<70 and DM with A1c goal<7  OSA on CPAP Compliance report shows satisfactory usage with optimal residual AHI.  Discussed continued nightly usage with ensuring greater than 4 hours nightly for optimal benefit and per insurance purposes.  Continue to follow with DME company for any needed supplies or CPAP related concerns      Follow-up in 1 year or call earlier if needed   CC: Doreene Nest, NP     I spent 33 minutes of face-to-face and non-face-to-face time with patient and wife.  This included previsit chart review, lab review, study review, order entry, electronic health record documentation, patient and wife discussion regarding above diagnoses and treatment plan and answered all other questions to patient wife satisfaction   Ihor Austin, [facility]  [facility] [address] [place], [place] [address]  Phone [phone] Fax [phone] Note: This document was prepared with digital dictation and possible smart phrase technology. Any transcriptional errors that  result from this process are unintentional.

## 2023-04-29 ENCOUNTER — Other Ambulatory Visit: Payer: Self-pay | Admitting: Physical Medicine & Rehabilitation

## 2023-05-03 ENCOUNTER — Telehealth: Payer: Self-pay

## 2023-05-18 ENCOUNTER — Other Ambulatory Visit: Payer: Self-pay | Admitting: Physical Medicine & Rehabilitation

## 2023-05-18 DIAGNOSIS — E1142 Type 2 diabetes mellitus with diabetic polyneuropathy: Secondary | ICD-10-CM

## 2023-05-29 ENCOUNTER — Other Ambulatory Visit: Payer: Self-pay | Admitting: Primary Care

## 2023-05-29 DIAGNOSIS — Z8673 Personal history of transient ischemic attack (TIA), and cerebral infarction without residual deficits: Secondary | ICD-10-CM

## 2023-05-29 DIAGNOSIS — R6889 Other general symptoms and signs: Secondary | ICD-10-CM

## 2023-05-29 DIAGNOSIS — N319 Neuromuscular dysfunction of bladder, unspecified: Secondary | ICD-10-CM

## 2023-06-09 ENCOUNTER — Ambulatory Visit: Payer: Medicare HMO | Admitting: Podiatry

## 2023-06-11 ENCOUNTER — Ambulatory Visit: Payer: Medicare HMO | Admitting: *Deleted

## 2023-06-11 ENCOUNTER — Encounter: Payer: Self-pay | Admitting: Podiatry

## 2023-06-11 ENCOUNTER — Ambulatory Visit: Payer: Medicare HMO | Admitting: Podiatry

## 2023-06-11 DIAGNOSIS — M79675 Pain in left toe(s): Secondary | ICD-10-CM | POA: Diagnosis not present

## 2023-06-11 DIAGNOSIS — Q666 Other congenital valgus deformities of feet: Secondary | ICD-10-CM

## 2023-06-11 DIAGNOSIS — M2010 Hallux valgus (acquired), unspecified foot: Secondary | ICD-10-CM

## 2023-06-11 DIAGNOSIS — M79674 Pain in right toe(s): Secondary | ICD-10-CM

## 2023-06-11 DIAGNOSIS — B351 Tinea unguium: Secondary | ICD-10-CM | POA: Diagnosis not present

## 2023-06-11 DIAGNOSIS — E1165 Type 2 diabetes mellitus with hyperglycemia: Secondary | ICD-10-CM

## 2023-06-11 NOTE — Progress Notes (Signed)
This patient presents  to my office for at risk foot care.  This patient requires this care by a professional since this patient will be at risk due to having diabetes,CVA and renal insufficiency.  This patient is unable to cut nails himself since the patient cannot reach his nails.These nails are painful walking and wearing shoes. He presents to the office with caregiver. This patient presents for at risk foot care today.  General Appearance  Alert, conversant and in no acute stress.  Vascular  Dorsalis pedis are weakly  palpable  bilaterally. Posterior tibial pulses are absent due to swelling  Bilateral.  Capillary return is within normal limits  bilaterally. Cold feet  bilaterally.  Neurologic  Senn-Weinstein monofilament wire test diminished right foot. LOPS WNL left foot. Muscle power within normal limits bilaterally.  Nails Thick disfigured discolored nails with subungual debris  from hallux to fifth toes bilaterally. No evidence of bacterial infection or drainage bilaterally.   Orthopedic  No limitations of motion  feet .  No crepitus or effusions noted.  HAV  B/L.  Skin  normotropic skin with no porokeratosis noted bilaterally.  No signs of infections or ulcers noted.     Onychomycosis  Pain in right toes  Pain in left toes  Injury right hallux nail plate.  Consent was obtained for treatment procedures.   Mechanical debridement of nails 1-5  bilaterally performed with a nail nipper.  Filed with dremel without incident. Patient is eligible for diabetic shoes.   Return office visit   4   months                  Told patient to return for periodic foot care and evaluation due to potential at risk complications.   Helane Gunther DPM

## 2023-06-11 NOTE — Progress Notes (Signed)
Patient presents to the office today for diabetic shoe and insole measuring.  Patient was measured with brannock device to determine size and width for 1 pair of extra depth shoes and foam casted for 3 pair of insoles.   Documentation of medical necessity will be sent to patient's treating diabetic doctor to verify and sign.   Patient's diabetic provider: Dr. Kerby Nora (pt sees Vernona Rieger, NP)   Shoes and insoles will be ordered at that time and patient will be notified for an appointment for fitting when they arrive.   Shoe size (per patient): Men's 13   Brannock measurement: RIGHT/LEFT: 13C   Patient shoe selection-   Shoe choice:   Apex V854  Shoe size ordered: Men's 13 wide

## 2023-06-14 NOTE — Progress Notes (Signed)
Order placed today for DM shoes and inserts  Patient presents to the office today for diabetic shoe and insole measuring.  Patient was measured with brannock device to determine size and width for 1 pair of extra depth shoes and foam casted for 3 pair of insoles.   Documentation of medical necessity will be sent to patient's treating diabetic doctor to verify and sign.   Patient's diabetic provider: AMY Claud Kelp M.D.  Shoes and insoles will be ordered at that time and patient will be notified for an appointment for fitting when they arrive.   Shoe size (per patient): 13WD Brannock measurement: 13 Patient shoe selection- Shoe choice:   [id number] / [id number] was not avail in 13 WD  Shoe size ordered: 13WD  ABN and Financials obtained  Addison Bailey CPed, CFo, CFm  Patient was seen with Dr Stacie Acres on 06/11/2023

## 2023-06-16 DIAGNOSIS — G4733 Obstructive sleep apnea (adult) (pediatric): Secondary | ICD-10-CM | POA: Diagnosis not present

## 2023-06-28 DIAGNOSIS — H524 Presbyopia: Secondary | ICD-10-CM | POA: Diagnosis not present

## 2023-07-07 ENCOUNTER — Encounter: Payer: Medicare HMO | Attending: Physical Medicine & Rehabilitation | Admitting: Physical Medicine & Rehabilitation

## 2023-07-07 ENCOUNTER — Encounter: Payer: Self-pay | Admitting: Physical Medicine & Rehabilitation

## 2023-07-07 VITALS — BP 118/73 | HR 74 | Ht 72.0 in | Wt 246.4 lb

## 2023-07-07 DIAGNOSIS — Z8673 Personal history of transient ischemic attack (TIA), and cerebral infarction without residual deficits: Secondary | ICD-10-CM | POA: Diagnosis not present

## 2023-07-07 DIAGNOSIS — K219 Gastro-esophageal reflux disease without esophagitis: Secondary | ICD-10-CM

## 2023-07-07 DIAGNOSIS — R269 Unspecified abnormalities of gait and mobility: Secondary | ICD-10-CM

## 2023-07-07 DIAGNOSIS — R1312 Dysphagia, oropharyngeal phase: Secondary | ICD-10-CM

## 2023-07-07 DIAGNOSIS — M542 Cervicalgia: Secondary | ICD-10-CM

## 2023-07-07 MED ORDER — FAMOTIDINE 20 MG PO TABS: 20.0000 mg | ORAL_TABLET | Freq: Two times a day (BID) | ORAL | 3 refills | Status: DC

## 2023-07-07 NOTE — Patient Instructions (Signed)
TRY TYLENOL AND /OR HEAT FOR YOUR NECK  A "NEUTRAL" PILLOW    CBD IS OK

## 2023-07-07 NOTE — Progress Notes (Signed)
Subjective:    Patient ID: Michael Bright, male    DOB: [birth year], 67 y.o.   MRN: [id number]  HPI  Michael Bright is here in follow up of his left lateral medullary infarct. He hasn't had much change with his swallow. He is still having dysarthria. GERD seems to have increased. He is not taking a prescription or anything over the counter .  For the past 2-3 weeks he has had right sided neck pain. He has tried CBD ointment with some benefit.    He has been using dulcolax liquid as a laxative after senna rx expired.   Pain levels are controlled with gabapentin.   His sleep has been good. Mood is positive.   Pain Inventory Average Pain 0 Pain Right Now 0 My pain is  other  In the last 24 hours, has pain interfered with the following? General activity 0 Relation with others 0 Enjoyment of life 0 What TIME of day is your pain at its worst? morning  Sleep (in general) Good  Pain is worse with:  no pain Pain improves with:  no pain Relief from Meds:  no pain  Family History  Adopted: Yes   Social History   Socioeconomic History   Marital status: Married    Spouse name: Not on file   Number of children: Not on file   Years of education: Not on file   Highest education level: Not on file  Occupational History   Not on file  Tobacco Use   Smoking status: Never   Smokeless tobacco: Never  Vaping Use   Vaping status: Never Used  Substance and Sexual Activity   Alcohol use: Yes   Drug use: No   Sexual activity: Yes  Other Topics Concern   Not on file  Social History Narrative   Not on file   Social Determinants of Health   Financial Resource Strain: Low Risk  (08/12/2022)   Overall Financial Resource Strain (CARDIA)    Difficulty of Paying Living Expenses: Not hard at all  Food Insecurity: No Food Insecurity (08/12/2022)   Hunger Vital Sign    Worried About Running Out of Food in the Last Year: Never true    Ran Out of Food in the Last Year: Never true   Transportation Needs: No Transportation Needs (08/12/2022)   PRAPARE - Administrator, Civil Service (Medical): No    Lack of Transportation (Non-Medical): No  Physical Activity: Insufficiently Active (08/12/2022)   Exercise Vital Sign    Days of Exercise per Week: 7 days    Minutes of Exercise per Session: 10 min  Stress: No Stress Concern Present (08/12/2022)   [employer] of Occupational Health - Occupational Stress Questionnaire    Feeling of Stress : Not at all  Social Connections: Moderately Isolated (08/12/2022)   Social Connection and Isolation Panel [NHANES]    Frequency of Communication with Friends and Family: More than three times a week    Frequency of Social Gatherings with Friends and Family: More than three times a week    Attends Religious Services: Never    Database administrator or Organizations: No    Attends Banker Meetings: Never    Marital Status: Married   Past Surgical History:  Procedure Laterality Date   CHOLECYSTECTOMY     IR GASTROSTOMY TUBE MOD SED  07/04/2019   IR GASTROSTOMY TUBE MOD SED  10/08/2022   IR REPLC GASTRO/COLONIC TUBE PERCUT W/FLUORO  12/11/2021  IR REPLC GASTRO/COLONIC TUBE PERCUT W/FLUORO  10/02/2022   IR REPLC GASTRO/COLONIC TUBE PERCUT W/FLUORO  03/25/2023   Past Surgical History:  Procedure Laterality Date   CHOLECYSTECTOMY     IR GASTROSTOMY TUBE MOD SED  07/04/2019   IR GASTROSTOMY TUBE MOD SED  10/08/2022   IR REPLC GASTRO/COLONIC TUBE PERCUT W/FLUORO  12/11/2021   IR REPLC GASTRO/COLONIC TUBE PERCUT W/FLUORO  10/02/2022   IR REPLC GASTRO/COLONIC TUBE PERCUT W/FLUORO  03/25/2023   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   Diabetes (HCC)    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  (CVA) 08/09/2019   Pneumonia    aspiration pneumonia   Renal insufficiency 05/30/2019   Sepsis with acute renal failure without septic shock (HCC)    Septic shock (HCC)    Status post insertion of percutaneous endoscopic gastrostomy (PEG) tube (HCC)    Stroke (HCC)    Urinary tract infection    Urinary tract infection without hematuria    BP 118/73   Pulse 74   Ht 6' (1.829 m)   Wt 246 lb 6.4 oz (111.8 kg)   SpO2 96%   BMI 33.42 kg/m   Opioid Risk Score:   Fall Risk Score:  `1  Depression screen [facility] 2/9     07/07/2023   11:04 AM 04/07/2023    9:18 AM 08/12/2022   12:54 PM 12/18/2020    1:11 PM 06/19/2020    1:29 PM 09/12/2019    5:21 PM 08/22/2019   10:31 AM  Depression screen PHQ 2/9  Decreased Interest 0 1 0 0 0 0 0  Down, Depressed, Hopeless 0 0 0 0 0 0 0  PHQ - 2 Score 0 1 0 0 0 0 0  Altered sleeping      0 0  Tired, decreased energy      3 3  Change in appetite      0 0  Feeling bad or failure about yourself       0 0  Trouble concentrating      0 0  Moving slowly or fidgety/restless      0 0  Suicidal thoughts      0 0  PHQ-9 Score      3 3  Difficult doing work/chores       Not difficult at all     Review of Systems  Constitutional: Negative.   HENT: Negative.    Eyes: Negative.   Respiratory: Negative.    Cardiovascular: Negative.   Gastrointestinal: Negative.   Endocrine: Negative.   Genitourinary: Negative.   Musculoskeletal:  Positive for neck pain.  Skin: Negative.   Allergic/Immunologic: Negative.   Neurological: Negative.   Hematological: Negative.   Psychiatric/Behavioral: Negative.    All other systems reviewed and are negative.      Objective:   Physical Exam General: No acute distress HEENT: NCAT, EOMI, oral membranes moist Cards: reg rate  Chest: normal effort Abdomen: Soft, NT, ND Skin: dry, intact Extremities: no edema Psych: pleasant and appropriate    Neuro: SPEECH remains wet and dysartrhic but intelligible. Right sided  sensory loss  arm/leg persists. Marland Kitchen strength nearly 5/5 in all 4's. ambulates nicely with RW and did surprisingly well without device.  Musculoskeletal: left extensor tendon tender at lateral epicondyle, worsened  with resistance of extensor indicis.  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 Probably needs to avoid ibuprofen due to GERD 3 Pain: maintain gabapentin for neuropathic pain.   4. Left lateral epicondylitis        -improved              5.  Mild Dupuytren's contractures of the left third and fourth fingers             - improved 6. Erectile dysfunction        -Viagra as needed 7. GERD: pepcid 20mg  bid   20  Minutes of face to face patient care time were spent during this visit. All questions were encouraged and answered.  Follow up with me in 12 mos .

## 2023-07-08 DIAGNOSIS — I639 Cerebral infarction, unspecified: Secondary | ICD-10-CM | POA: Diagnosis not present

## 2023-07-08 DIAGNOSIS — R1312 Dysphagia, oropharyngeal phase: Secondary | ICD-10-CM | POA: Diagnosis not present

## 2023-07-08 DIAGNOSIS — E119 Type 2 diabetes mellitus without complications: Secondary | ICD-10-CM | POA: Diagnosis not present

## 2023-07-08 DIAGNOSIS — Z931 Gastrostomy status: Secondary | ICD-10-CM | POA: Diagnosis not present

## 2023-07-08 DIAGNOSIS — R6889 Other general symptoms and signs: Secondary | ICD-10-CM | POA: Diagnosis not present

## 2023-07-17 DIAGNOSIS — G4733 Obstructive sleep apnea (adult) (pediatric): Secondary | ICD-10-CM | POA: Diagnosis not present

## 2023-08-04 ENCOUNTER — Telehealth: Payer: Self-pay | Admitting: Podiatry

## 2023-08-04 NOTE — Telephone Encounter (Signed)
Received call from Tiffany at [facility] and the shoe that was ordered for pt is not available in his size It was the x531m size 13w, I had her check to see if they had [id number] in size 13w and they did not have that one either. Called [facility] and they did have the x867m in stock in pts size.  I called and left message for pt that we got the call that hte shoe he ordered is not in stock and to see if he would like to get the one that is gray and white  X872m verses the navy blue and white since it is out of stock.

## 2023-08-17 ENCOUNTER — Ambulatory Visit (INDEPENDENT_AMBULATORY_CARE_PROVIDER_SITE_OTHER): Payer: Medicare HMO

## 2023-08-17 VITALS — Ht 72.0 in | Wt 246.0 lb

## 2023-08-17 DIAGNOSIS — Z0000 Encounter for general adult medical examination without abnormal findings: Secondary | ICD-10-CM

## 2023-08-17 NOTE — Progress Notes (Signed)
Subjective:   Michael Bright is a 67 y.o. male who presents for Medicare Annual/Subsequent preventive examination.  Visit Complete: Virtual I connected with  Michael Bright on 08/17/23 by a audio enabled telemedicine application and verified that I am speaking with the correct person using two identifiers.  Patient Location: Home  Provider Location: Office/Clinic  I discussed the limitations of evaluation and management by telemedicine. The patient expressed understanding and agreed to proceed.  Vital Signs: Because this visit was a virtual/telehealth visit, some criteria may be missing or patient reported. Any vitals not documented were not able to be obtained and vitals that have been documented are patient reported.  Patient Medicare AWV questionnaire was completed by the patient on (not done); I have confirmed that all information answered by patient is correct and no changes since this date. Cardiac Risk Factors include: advanced age (>59men, >10 women);diabetes mellitus;hypertension;obesity (BMI >30kg/m2);sedentary lifestyle    Objective:    Today's Vitals   08/17/23 0832  Weight: 246 lb (111.6 kg)  Height: 6' (1.829 m)   Body mass index is 33.36 kg/m.     08/17/2023    8:45 AM 10/08/2022    7:33 AM 10/02/2022    8:15 AM 09/13/2022    3:08 PM 08/12/2022   12:58 PM 06/24/2022   10:19 AM 10/26/2019    2:52 PM  Advanced Directives  Does Patient Have a Medical Advance Directive? No No No No No No No  Would patient like information on creating a medical advance directive?  No - Patient declined No - Patient declined No - Patient declined No - Patient declined      Current Medications (verified) Outpatient Encounter Medications as of 08/17/2023  Medication Sig   Accu-Chek FastClix Lancets MISC USE AS DIRECTED UP TO FOUR TIMES DAILY   acetaminophen (TYLENOL) 325 MG tablet 325-650 mg by Per G Tube route as needed.   aspirin 81 MG chewable tablet 81 mg by Per G Tube route  daily.   atorvastatin (LIPITOR) 40 MG tablet TAKE 1 TABLET (40 MG TOTAL) BY MOUTH AT BEDTIME. FOR CHOLESTEROL.   chlorhexidine (PERIDEX) 0.12 % solution RINSE AND SPIT WITH 15 MLS BY MOUTH 2 (TWO) TIMES DAILY.   famotidine (PEPCID) 20 MG tablet Take 1 tablet (20 mg total) by mouth 2 (two) times daily.   finasteride (PROSCAR) 5 MG tablet PLACE ONE TABLET IN GI TUBE ONCE DAILY FOR URINE FLOW.   fluticasone (FLONASE) 50 MCG/ACT nasal spray PLACE 1 SPRAY INTO BOTH NOSTRILS 2 (TWO) TIMES DAILY AS NEEDED FOR ALLERGIES OR RHINITIS.   gabapentin (NEURONTIN) 250 MG/5ML solution TAKE 2.5-5 MLS (125-250 MG TOTAL) BY MOUTH AT BEDTIME. FOR PAIN.   glucose blood (ACCU-CHEK GUIDE) test strip USE AS DIRECTED UP TO FOUR TIMES DAILY   glycopyrrolate (ROBINUL) 1 MG tablet Place 2 tablets (2 mg total) into feeding tube 3 (three) times daily. For oral secretions.   guaifenesin (ROBITUSSIN) 100 MG/5ML syrup PLACE 10 MLS (200 MG TOTAL) INTO FEEDING TUBE 4 (FOUR) TIMES DAILY - WITH MEALS AND AT BEDTIME.   mouth rinse LIQD solution 15 mLs by Mouth Rinse route 5 (five) times daily.   Nutritional Supplements (FEEDING SUPPLEMENT, OSMOLITE 1.5 CAL,) LIQD Place 325 mLs into feeding tube 4 (four) times daily. (Patient taking differently: Place 237 mLs into feeding tube 4 (four) times daily.)   Water For Irrigation, Sterile (FREE WATER) SOLN Place 200 mLs into feeding tube every 8 (eight) hours. Use filtered or bottled water (not distilled water)  No facility-administered encounter medications on file as of 08/17/2023.    Allergies (verified) Patient has no allergy information on record.   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 (CVA)  08/09/2019   Pneumonia    aspiration pneumonia   Renal insufficiency 05/30/2019   Sepsis with acute renal failure without septic shock (HCC)    Septic shock (HCC)    Status post insertion of percutaneous endoscopic gastrostomy (PEG) tube (HCC)    Stroke (HCC)    Urinary tract infection    Urinary tract infection without hematuria    Past Surgical History:  Procedure Laterality Date   CHOLECYSTECTOMY     IR GASTROSTOMY TUBE MOD SED  07/04/2019   IR GASTROSTOMY TUBE MOD SED  10/08/2022   IR REPLC GASTRO/COLONIC TUBE PERCUT W/FLUORO  12/11/2021   IR REPLC GASTRO/COLONIC TUBE PERCUT W/FLUORO  10/02/2022   IR REPLC GASTRO/COLONIC TUBE PERCUT W/FLUORO  03/25/2023   Family History  Adopted: Yes   Social History   Socioeconomic History   Marital status: Married    Spouse name: Not on file   Number of children: Not on file   Years of education: Not on file   Highest education level: Not on file  Occupational History   Not on file  Tobacco Use   Smoking status: Never   Smokeless tobacco: Never  Vaping Use   Vaping status: Never Used  Substance and Sexual Activity   Alcohol use: Yes   Drug use: No   Sexual activity: Yes  Other Topics Concern   Not on file  Social History Narrative   Not on file   Social Determinants of Health   Financial Resource Strain: Low Risk  (08/17/2023)   Overall Financial Resource Strain (CARDIA)    Difficulty of Paying Living Expenses: Not hard at all  Food Insecurity: No Food Insecurity (08/17/2023)   Hunger Vital Sign    Worried About Radiation protection practitioner of Food in the Last Year: Never true    Ran Out of Food in the Last Year: Never true  Transportation Needs: No Transportation Needs (08/17/2023)   PRAPARE - Administrator, Civil Service (Medical): No    Lack of Transportation (Non-Medical): No  Physical Activity: Inactive (08/17/2023)   Exercise Vital Sign    Days of Exercise per Week: 0 days    Minutes of Exercise per Session: 0 min   Stress: No Stress Concern Present (08/17/2023)   [employer] of Occupational Health - Occupational Stress Questionnaire    Feeling of Stress : Not at all  Social Connections: Moderately Isolated (08/17/2023)   Social Connection and Isolation Panel [NHANES]    Frequency of Communication with Friends and Family: Three times a week    Frequency of Social Gatherings with Friends and Family: Three times a week    Attends Religious Services: Never    Active Member of Clubs or Organizations: No    Attends Banker Meetings: Never    Marital Status: Married    Tobacco Counseling Counseling given: Not Answered   Clinical Intake:  Pre-visit preparation completed: Yes  Pain : No/denies pain     BMI - recorded: 33.36 Nutritional Status: BMI > 30  Obese Nutritional Risks: None Diabetes: Yes CBG done?: Yes (BS 72 this am at home) CBG resulted in Enter/ Edit results?: No Did pt. bring in CBG monitor from home?: No  How often do you need to have someone help you when you read instructions, pamphlets, or other written materials from your doctor or pharmacy?: 1 - Never  Interpreter Needed?: No  Comments: lives with wife Information entered by :: B.Adelyne Marchese,LPN   Activities of Daily Living    08/17/2023    8:45 AM  In your present state of health, do you have any difficulty performing the following activities:  Hearing? 0  Vision? 0  Difficulty concentrating or making decisions? 0  Walking or climbing stairs? 0  Dressing or bathing? 0  Doing errands, shopping? 0  Preparing Food and eating ? N  Using the Toilet? N  In the past six months, have you accidently leaked urine? N  Do you have problems with loss of bowel control? N  Managing your Medications? N  Managing your Finances? N  Housekeeping or managing your Housekeeping? N    Patient Care Team: Doreene Nest, NP as PCP - General (Internal Medicine) Debroah Loop, DO (Otolaryngology) Love,  Margarette Asal as Physician Assistant (Physical Medicine and Rehabilitation) Nelson Chimes, MD as Attending Physician (Ophthalmology) Shon Millet, MD as Consulting Physician (Ophthalmology)  Indicate any recent Medical Services you may have received from other than [facility] providers in the past year (date may be approximate).     Assessment:   This is a routine wellness examination for Michael Bright.  Hearing/Vision screen Hearing Screening - Comments:: Pt says he hears well Vision Screening - Comments:: Pt says he sees well with glasses Dr Sherrine Maples   Goals Addressed               This Visit's Progress     Patient Stated        No goals       Stay alive (pt-stated)   On track      Depression Screen    08/17/2023    8:42 AM 07/07/2023   11:04 AM 04/07/2023    9:18 AM 08/12/2022   12:54 PM 12/18/2020    1:11 PM 06/19/2020    1:29 PM 09/12/2019    5:21 PM  PHQ 2/9 Scores  PHQ - 2 Score 0 0 1 0 0 0 0  PHQ- 9 Score       3    Fall Risk    08/17/2023    8:36 AM 07/07/2023   11:04 AM 04/07/2023    9:18 AM 08/12/2022   12:57 PM 06/24/2022   10:18 AM  Fall Risk   Falls in the past year? 0 0 0 0 0  Number falls in past yr: 0 0 0 0   Injury with Fall? 0  0 0   Risk for fall due to : No Fall Risks Impaired balance/gait No Fall Risks No Fall Risks   Follow up Falls prevention discussed;Education provided  Falls evaluation completed Falls prevention discussed     MEDICARE RISK AT HOME: Medicare Risk at Home Any stairs in or around the home?: Yes If so, are there any without handrails?: Yes Home free of loose throw rugs in walkways, pet beds, electrical cords, etc?: Yes Adequate lighting in your home to reduce risk of falls?: Yes Life alert?: No Use of a cane, walker or w/c?: Yes (walker) Grab bars in the bathroom?: Yes Shower chair or bench  in shower?: Yes Elevated toilet seat or a handicapped toilet?: Yes  TIMED UP AND GO:  Was the test performed?  No    Cognitive  Function:        08/17/2023    8:47 AM 08/12/2022   12:58 PM  6CIT Screen  What Year? 0 points 0 points  What month? 0 points 0 points  What time? 0 points 0 points  Count back from 20 2 points 0 points  Months in reverse 4 points 2 points  Repeat phrase 0 points 0 points  Total Score 6 points 2 points    Immunizations Immunization History  Administered Date(s) Administered   Fluad Quad(high Dose 65+) 09/07/2022   Influenza Inj Mdck Quad Pf 07/01/2020   Influenza,inj,Quad PF,6+ Mos 06/28/2019, 07/18/2021   PFIZER(Purple Top)SARS-COV-2 Vaccination 12/23/2019, 01/13/2020, 08/03/2020   PNEUMOCOCCAL CONJUGATE-20 09/29/2022   Pfizer Covid-19 Vaccine Bivalent Booster 63yrs & up 08/07/2021   Pfizer(Comirnaty)Fall Seasonal Vaccine 12 years and older 09/07/2022, 07/06/2023   Pneumococcal Polysaccharide-23 06/06/2019   Zoster Recombinant(Shingrix) 08/07/2021    TDAP status: Up to date  Flu Vaccine status: Up to date  Pneumococcal vaccine status: Up to date  Covid-19 vaccine status: Completed vaccines  Qualifies for Shingles Vaccine? Yes   Zostavax completed Yes   Shingrix Completed?: Yes  Screening Tests Health Maintenance  Topic Date Due   Diabetic kidney evaluation - Urine ACR  09/04/2022   Zoster Vaccines- Shingrix (2 of 2) 11/17/2023 (Originally 10/02/2021)   DTaP/Tdap/Td (1 - Tdap) 08/16/2024 (Originally 06/06/1975)   Diabetic kidney evaluation - eGFR measurement  09/30/2023   HEMOGLOBIN A1C  10/07/2023   FOOT EXAM  11/05/2023   OPHTHALMOLOGY EXAM  06/27/2024   Medicare Annual Wellness (AWV)  08/16/2024   Colonoscopy  10/24/2026   Pneumonia Vaccine 79+ Years old  Completed   INFLUENZA VACCINE  Completed   COVID-19 Vaccine  Completed   Hepatitis C Screening  Completed   HPV VACCINES  Aged Out    Health Maintenance  Health Maintenance Due  Topic Date Due   Diabetic kidney evaluation - Urine ACR  09/04/2022    Colorectal cancer screening: Type of screening:  Colonoscopy. Completed 10/24/21. Repeat every 5 years  Lung Cancer Screening: (Low Dose CT Chest recommended if Age 90-80 years, 20 pack-year currently smoking OR have quit w/in 15years.) does not qualify.   Lung Cancer Screening Referral: no  Additional Screening:  Hepatitis C Screening: does not qualify; Completed 08/23/2020  Vision Screening: Recommended annual ophthalmology exams for early detection of glaucoma and other disorders of the eye. Is the patient up to date with their annual eye exam?  Yes  Who is the provider or what is the name of the office in which the patient attends annual eye exams? Dr Sherrine Maples If pt is not established with a provider, would they like to be referred to a provider to establish care? No .   Dental Screening: Recommended annual dental exams for proper oral hygiene  Diabetic Foot Exam: Diabetic Foot Exam: Completed 03/2023  Community Resource Referral / Chronic Care Management: CRR required this visit?  No   CCM required this visit?  No   Plan:     I have personally reviewed and noted the following in the patient's chart:   Medical and social history Use of alcohol, tobacco or illicit drugs  Current medications and supplements including opioid prescriptions. Patient is not currently taking opioid prescriptions. Functional ability and status Nutritional status Physical activity Advanced directives List of  other physicians Hospitalizations, surgeries, and ER visits in previous 12 months Vitals Screenings to include cognitive, depression, and falls Referrals and appointments  In addition, I have reviewed and discussed with patient certain preventive protocols, quality metrics, and best practice recommendations. A written personalized care plan for preventive services as well as general preventive health recommendations were provided to patient.   Sue Lush, LPN   [id number]   After Visit Summary: (MyChart) Due to this being a telephonic  visit, the after visit summary with patients personalized plan was offered to patient via MyChart   Nurse Notes: The patient visit conducted via speaker phone with pt and his wife: who states they are doing well and has no concerns or questions at this time.

## 2023-08-17 NOTE — Patient Instructions (Signed)
Michael Bright , Thank you for taking time to come for your Medicare Wellness Visit. I appreciate your ongoing commitment to your health goals. Please review the following plan we discussed and let me know if I can assist you in the future.   Referrals/Orders/Follow-Ups/Clinician Recommendations: none  This is a list of the screening recommended for you and due dates:  Health Maintenance  Topic Date Due   Yearly kidney health urinalysis for diabetes  09/04/2022   Zoster (Shingles) Vaccine (2 of 2) 11/17/2023*   DTaP/Tdap/Td vaccine (1 - Tdap) 08/16/2024*   Yearly kidney function blood test for diabetes  09/30/2023   Hemoglobin A1C  10/07/2023   Complete foot exam   11/05/2023   Eye exam for diabetics  06/27/2024   Medicare Annual Wellness Visit  08/16/2024   Colon Cancer Screening  10/24/2026   Pneumonia Vaccine  Completed   Flu Shot  Completed   COVID-19 Vaccine  Completed   Hepatitis C Screening  Completed   HPV Vaccine  Aged Out  *Topic was postponed. The date shown is not the original due date.    Advanced directives: (Declined) Advance directive discussed with you today. Even though you declined this today, please call our office should you change your mind, and we can give you the proper paperwork for you to fill out.  Next Medicare Annual Wellness Visit scheduled for next year: Yes 08/18/2024 @ 10:10am telephone

## 2023-08-20 ENCOUNTER — Other Ambulatory Visit: Payer: Self-pay | Admitting: Primary Care

## 2023-08-20 DIAGNOSIS — E785 Hyperlipidemia, unspecified: Secondary | ICD-10-CM

## 2023-08-23 ENCOUNTER — Other Ambulatory Visit: Payer: Self-pay | Admitting: Primary Care

## 2023-08-23 DIAGNOSIS — R6889 Other general symptoms and signs: Secondary | ICD-10-CM

## 2023-08-23 DIAGNOSIS — Z8673 Personal history of transient ischemic attack (TIA), and cerebral infarction without residual deficits: Secondary | ICD-10-CM

## 2023-08-23 DIAGNOSIS — I69398 Other sequelae of cerebral infarction: Secondary | ICD-10-CM

## 2023-08-24 DIAGNOSIS — E113413 Type 2 diabetes mellitus with severe nonproliferative diabetic retinopathy with macular edema, bilateral: Secondary | ICD-10-CM | POA: Diagnosis not present

## 2023-08-24 DIAGNOSIS — H35033 Hypertensive retinopathy, bilateral: Secondary | ICD-10-CM | POA: Diagnosis not present

## 2023-08-24 DIAGNOSIS — H43823 Vitreomacular adhesion, bilateral: Secondary | ICD-10-CM | POA: Diagnosis not present

## 2023-08-24 DIAGNOSIS — Z961 Presence of intraocular lens: Secondary | ICD-10-CM | POA: Diagnosis not present

## 2023-09-03 ENCOUNTER — Other Ambulatory Visit: Payer: Self-pay | Admitting: Primary Care

## 2023-09-03 DIAGNOSIS — E785 Hyperlipidemia, unspecified: Secondary | ICD-10-CM

## 2023-09-13 ENCOUNTER — Ambulatory Visit (INDEPENDENT_AMBULATORY_CARE_PROVIDER_SITE_OTHER): Payer: Medicare HMO | Admitting: Podiatry

## 2023-09-13 ENCOUNTER — Encounter: Payer: Self-pay | Admitting: Podiatry

## 2023-09-13 DIAGNOSIS — M79675 Pain in left toe(s): Secondary | ICD-10-CM

## 2023-09-13 DIAGNOSIS — E1165 Type 2 diabetes mellitus with hyperglycemia: Secondary | ICD-10-CM | POA: Diagnosis not present

## 2023-09-13 DIAGNOSIS — M79674 Pain in right toe(s): Secondary | ICD-10-CM

## 2023-09-13 DIAGNOSIS — B351 Tinea unguium: Secondary | ICD-10-CM

## 2023-09-13 DIAGNOSIS — M2010 Hallux valgus (acquired), unspecified foot: Secondary | ICD-10-CM

## 2023-09-13 DIAGNOSIS — Q666 Other congenital valgus deformities of feet: Secondary | ICD-10-CM | POA: Diagnosis not present

## 2023-09-13 NOTE — Progress Notes (Signed)
This patient presents  to my office for at risk foot care.  This patient requires this care by a professional since this patient will be at risk due to having diabetes,CVA and renal insufficiency.  This patient is unable to cut nails himself since the patient cannot reach his nails.These nails are painful walking and wearing shoes. He presents to the office with caregiver. This patient presents for at risk foot care today.  General Appearance  Alert, conversant and in no acute stress.  Vascular  Dorsalis pedis are weakly  palpable  bilaterally. Posterior tibial pulses are absent due to swelling  Bilateral.  Capillary return is within normal limits  bilaterally. Cold feet  bilaterally.  Neurologic  Senn-Weinstein monofilament wire test diminished right foot. LOPS WNL left foot. Muscle power within normal limits bilaterally.  Nails Thick disfigured discolored nails with subungual debris  from hallux to fifth toes bilaterally. No evidence of bacterial infection or drainage bilaterally.   Orthopedic  No limitations of motion  feet .  No crepitus or effusions noted.  HAV  B/L.  Skin  normotropic skin with no porokeratosis noted bilaterally.  No signs of infections or ulcers noted.     Onychomycosis  Pain in right toes  Pain in left toes  Injury right hallux nail plate.  Consent was obtained for treatment procedures.   Mechanical debridement of nails 1-5  bilaterally performed with a nail nipper.  Filed with dremel without incident. Patient is eligible for diabetic shoes.   Return office visit   4   months                  Told patient to return for periodic foot care and evaluation due to potential at risk complications.   Helane Gunther DPM

## 2023-09-14 NOTE — Progress Notes (Signed)

## 2023-09-18 DIAGNOSIS — G4733 Obstructive sleep apnea (adult) (pediatric): Secondary | ICD-10-CM | POA: Diagnosis not present

## 2023-10-01 ENCOUNTER — Other Ambulatory Visit: Payer: Self-pay | Admitting: Physical Medicine & Rehabilitation

## 2023-10-01 DIAGNOSIS — R1312 Dysphagia, oropharyngeal phase: Secondary | ICD-10-CM

## 2023-10-07 ENCOUNTER — Ambulatory Visit: Payer: Medicare HMO | Admitting: Primary Care

## 2023-10-08 DIAGNOSIS — R1312 Dysphagia, oropharyngeal phase: Secondary | ICD-10-CM | POA: Diagnosis not present

## 2023-10-08 DIAGNOSIS — Z931 Gastrostomy status: Secondary | ICD-10-CM | POA: Diagnosis not present

## 2023-10-08 DIAGNOSIS — E119 Type 2 diabetes mellitus without complications: Secondary | ICD-10-CM | POA: Diagnosis not present

## 2023-10-14 ENCOUNTER — Encounter: Payer: Self-pay | Admitting: Primary Care

## 2023-10-14 ENCOUNTER — Ambulatory Visit (INDEPENDENT_AMBULATORY_CARE_PROVIDER_SITE_OTHER): Payer: Medicare HMO | Admitting: Primary Care

## 2023-10-14 ENCOUNTER — Other Ambulatory Visit: Payer: Self-pay | Admitting: Physical Medicine & Rehabilitation

## 2023-10-14 VITALS — BP 128/64 | HR 105 | Temp 101.3°F | Ht 72.0 in | Wt 239.0 lb

## 2023-10-14 DIAGNOSIS — Z0001 Encounter for general adult medical examination with abnormal findings: Secondary | ICD-10-CM

## 2023-10-14 DIAGNOSIS — J101 Influenza due to other identified influenza virus with other respiratory manifestations: Secondary | ICD-10-CM | POA: Diagnosis not present

## 2023-10-14 DIAGNOSIS — R6889 Other general symptoms and signs: Secondary | ICD-10-CM

## 2023-10-14 DIAGNOSIS — R051 Acute cough: Secondary | ICD-10-CM

## 2023-10-14 DIAGNOSIS — I10 Essential (primary) hypertension: Secondary | ICD-10-CM | POA: Diagnosis not present

## 2023-10-14 DIAGNOSIS — R39198 Other difficulties with micturition: Secondary | ICD-10-CM

## 2023-10-14 DIAGNOSIS — R202 Paresthesia of skin: Secondary | ICD-10-CM

## 2023-10-14 DIAGNOSIS — E1165 Type 2 diabetes mellitus with hyperglycemia: Secondary | ICD-10-CM | POA: Diagnosis not present

## 2023-10-14 DIAGNOSIS — E785 Hyperlipidemia, unspecified: Secondary | ICD-10-CM

## 2023-10-14 DIAGNOSIS — G4733 Obstructive sleep apnea (adult) (pediatric): Secondary | ICD-10-CM

## 2023-10-14 DIAGNOSIS — R1312 Dysphagia, oropharyngeal phase: Secondary | ICD-10-CM

## 2023-10-14 DIAGNOSIS — K5900 Constipation, unspecified: Secondary | ICD-10-CM

## 2023-10-14 DIAGNOSIS — Z125 Encounter for screening for malignant neoplasm of prostate: Secondary | ICD-10-CM

## 2023-10-14 DIAGNOSIS — Z931 Gastrostomy status: Secondary | ICD-10-CM

## 2023-10-14 HISTORY — DX: Influenza due to other identified influenza virus with other respiratory manifestations: J10.1

## 2023-10-14 LAB — POCT INFLUENZA A/B
Influenza A, POC: POSITIVE — AB
Influenza B, POC: NEGATIVE

## 2023-10-14 LAB — POC COVID19 BINAXNOW: SARS Coronavirus 2 Ag: NEGATIVE

## 2023-10-14 MED ORDER — GLUCOSE BLOOD VI STRP: ORAL_STRIP | 3 refills | Status: AC

## 2023-10-14 MED ORDER — OSELTAMIVIR PHOSPHATE 75 MG PO CAPS: 75.0000 mg | ORAL_CAPSULE | Freq: Two times a day (BID) | ORAL | 0 refills | Status: AC

## 2023-10-14 NOTE — Assessment & Plan Note (Signed)
Stable.  Continue glycopyrrolate 2 mg 3 times daily.

## 2023-10-14 NOTE — Patient Instructions (Signed)
Stop by the lab prior to leaving today. I will notify you of your results once received.   Start Tamiflu for flu symptoms.  Take 1 tablet per G-tube twice daily x 5 days.  Please schedule a follow up visit for 6 months for a diabetes check.  It was a pleasure to see you today!

## 2023-10-14 NOTE — Progress Notes (Signed)
Subjective:    Patient ID: Michael Bright, male    DOB: 04-03-1956, 67 y.o.   MRN: [id number]  Fever  Associated symptoms include congestion and coughing. Pertinent negatives include no chest pain, diarrhea, headaches or rash.    Michael Bright is a very pleasant 67 y.o. male who presents today for complete physical and follow up of chronic conditions.  He would also like to discuss acute cough. Symptom onset two days ago with cough, increased mucous production, chills, and body aches.   Immunizations: -Influenza: Positive for influenza today -Shingles: Completed Shingrix series -Pneumonia: Completed Prevnar 20 in 2023, pneumovax 23 in 2020  Diet: Fair diet.  Exercise: No regular exercise.  Eye exam: Completes annually  Dental exam: Completes semi-annually    Colonoscopy: Completed in 2023, due 2028  PSA: Due   BP Readings from Last 3 Encounters:  10/14/23 128/64  07/07/23 118/73  04/28/23 119/67        Review of Systems  Constitutional:  Positive for fever. Negative for unexpected weight change.  HENT:  Positive for congestion. Negative for rhinorrhea.   Respiratory:  Positive for cough. Negative for shortness of breath.   Cardiovascular:  Negative for chest pain.  Gastrointestinal:  Negative for constipation and diarrhea.  Genitourinary:  Negative for difficulty urinating.  Musculoskeletal:  Negative for arthralgias and myalgias.  Skin:  Negative for rash.  Allergic/Immunologic: Negative for environmental allergies.  Neurological:  Negative for dizziness and headaches.  Psychiatric/Behavioral:  The patient is not nervous/anxious.          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   Diabetes (HCC)    Neurogenic bladder as late  effect of cerebrovascular accident (CVA) 08/09/2019   Pneumonia    aspiration pneumonia   Renal insufficiency 05/30/2019   Sepsis with acute renal failure without septic shock (HCC)    Septic shock (HCC)    Status post insertion of percutaneous endoscopic gastrostomy (PEG) tube (HCC)    Stroke (HCC)    Urinary tract infection    Urinary tract infection without hematuria     Social History   Socioeconomic History   Marital status: Married    Spouse name: Not on file   Number of children: Not on file   Years of education: Not on file   Highest education level: Not on file  Occupational History   Not on file  Tobacco Use   Smoking status: Never   Smokeless tobacco: Never  Vaping Use   Vaping status: Never Used  Substance and Sexual Activity   Alcohol use: Yes   Drug use: No   Sexual activity: Yes  Other Topics Concern   Not on file  Social History Narrative   Not on file   Social Drivers of Health   Financial Resource Strain: Low Risk  (08/17/2023)   Overall Financial Resource Strain (CARDIA)    Difficulty of Paying Living Expenses: Not hard at all  Food Insecurity: No Food Insecurity (08/17/2023)   Hunger Vital Sign    Worried About Running Out of Food in the Last Year: Never true    Ran Out of Food in the Last Year: Never true  Transportation Needs: No Transportation Needs (08/17/2023)   PRAPARE - Transportation    Lack of  Transportation (Medical): No    Lack of Transportation (Non-Medical): No  Physical Activity: Inactive (08/17/2023)   Exercise Vital Sign    Days of Exercise per Week: 0 days    Minutes of Exercise per Session: 0 min  Stress: No Stress Concern Present (08/17/2023)   [employer] of Occupational Health - Occupational Stress Questionnaire    Feeling of Stress : Not at all  Social Connections: Moderately Isolated (08/17/2023)   Social Connection and Isolation Panel [NHANES]    Frequency of Communication with Friends and Family: Three times  a week    Frequency of Social Gatherings with Friends and Family: Three times a week    Attends Religious Services: Never    Active Member of Clubs or Organizations: No    Attends Banker Meetings: Never    Marital Status: Married  Catering manager Violence: Not At Risk (08/17/2023)   Humiliation, Afraid, Rape, and Kick questionnaire    Fear of Current or Ex-Partner: No    Emotionally Abused: No    Physically Abused: No    Sexually Abused: No    Past Surgical History:  Procedure Laterality Date   CHOLECYSTECTOMY     IR GASTROSTOMY TUBE MOD SED  07/04/2019   IR GASTROSTOMY TUBE MOD SED  10/08/2022   IR REPLC GASTRO/COLONIC TUBE PERCUT W/FLUORO  12/11/2021   IR REPLC GASTRO/COLONIC TUBE PERCUT W/FLUORO  10/02/2022   IR REPLC GASTRO/COLONIC TUBE PERCUT W/FLUORO  03/25/2023    Family History  Adopted: Yes    No Known Allergies  Current Outpatient Medications on File Prior to Visit  Medication Sig Dispense Refill   Accu-Chek FastClix Lancets MISC USE AS DIRECTED UP TO FOUR TIMES DAILY 102 each 5   acetaminophen (TYLENOL) 325 MG tablet 325-650 mg by Per G Tube route as needed.     aspirin 81 MG chewable tablet 81 mg by Per G Tube route daily.     atorvastatin (LIPITOR) 40 MG tablet TAKE 1 TABLET (40 MG TOTAL) BY MOUTH AT BEDTIME. FOR CHOLESTEROL. 90 tablet 0   chlorhexidine (PERIDEX) 0.12 % solution RINSE AND SPIT WITH 15 MLS BY MOUTH 2 (TWO) TIMES DAILY. 473 mL 6   famotidine (PEPCID) 20 MG tablet TAKE 1 TABLET BY MOUTH TWICE A DAY 180 tablet 3   finasteride (PROSCAR) 5 MG tablet PLACE ONE TABLET IN GI TUBE ONCE DAILY FOR URINE FLOW. 90 tablet 0   fluticasone (FLONASE) 50 MCG/ACT nasal spray PLACE 1 SPRAY INTO BOTH NOSTRILS 2 (TWO) TIMES DAILY AS NEEDED FOR ALLERGIES OR RHINITIS. 16 mL 2   gabapentin (NEURONTIN) 250 MG/5ML solution TAKE 2.5-5 MLS (125-250 MG TOTAL) BY MOUTH AT BEDTIME. FOR PAIN. 150 mL 6   glycopyrrolate (ROBINUL) 1 MG tablet PLACE 2 TABLETS (2 MG TOTAL)  INTO FEEDING TUBE 3 (THREE) TIMES DAILY. FOR ORAL SECRETIONS. 540 tablet 0   guaifenesin (ROBITUSSIN) 100 MG/5ML syrup PLACE 10 MLS (200 MG TOTAL) INTO FEEDING TUBE 4 (FOUR) TIMES DAILY - WITH MEALS AND AT BEDTIME. 948 mL 0   mouth rinse LIQD solution 15 mLs by Mouth Rinse route 5 (five) times daily. 946 mL 1   Nutritional Supplements (FEEDING SUPPLEMENT, OSMOLITE 1.5 CAL,) LIQD Place 325 mLs into feeding tube 4 (four) times daily. (Patient taking differently: Place 237 mLs into feeding tube 4 (four) times daily.)  0   Water For Irrigation, Sterile (FREE WATER) SOLN Place 200 mLs into feeding tube every 8 (eight) hours. Use filtered or bottled water (not distilled water)  No current facility-administered medications on file prior to visit.    BP 128/64   Pulse (!) 105   Temp (!) 101.3 F (38.5 C) (Oral)   Ht 6' (1.829 m)   Wt 239 lb (108.4 kg)   SpO2 97%   BMI 32.41 kg/m  Objective:   Physical Exam HENT:     Right Ear: Tympanic membrane and ear canal normal.     Left Ear: Tympanic membrane and ear canal normal.  Eyes:     Pupils: Pupils are equal, round, and reactive to light.  Cardiovascular:     Rate and Rhythm: Normal rate and regular rhythm.  Pulmonary:     Effort: Pulmonary effort is normal.     Breath sounds: Normal breath sounds.  Abdominal:     General: Bowel sounds are normal.     Palpations: Abdomen is soft.     Tenderness: There is no abdominal tenderness.  Musculoskeletal:        General: Normal range of motion.     Cervical back: Neck supple.  Skin:    General: Skin is warm and dry.  Neurological:     Mental Status: He is alert and oriented to person, place, and time.     Cranial Nerves: No cranial nerve deficit.     Deep Tendon Reflexes:     Reflex Scores:      Patellar reflexes are 2+ on the right side and 2+ on the left side. Psychiatric:        Mood and Affect: Mood normal.           Assessment & Plan:  Encounter for annual general medical  examination with abnormal findings in adult Assessment & Plan: Immunizations UTD. Colonoscopy UTD, due 2028 PSA due and pending.  Discussed the importance of a healthy diet and regular exercise in order for weight loss, and to reduce the risk of further co-morbidity.  Exam stable. Labs pending.  Follow up in 1 year for repeat physical.    Essential hypertension Assessment & Plan: Controlled.  Remain off treatment.  Orders: -     CBC -     Comprehensive metabolic panel  Oropharyngeal dysphagia Assessment & Plan: Stable.  PEG tube in place, no complications.   OSA on CPAP Assessment & Plan: Following with neurology, office notes reviewed from July 2024.  Continue nightly CPAP.   Copious oral secretions Assessment & Plan: Stable.  Continue glycopyrrolate 2 mg 3 times daily.   Controlled type 2 diabetes mellitus with hyperglycemia, without long-term current use of insulin (HCC) Assessment & Plan: Repeat A1C pending.  Remain off treatment.  Urine microalbumin due and pending.  Follow-up in 6 months.  Orders: -     Hemoglobin A1c -     Glucose Blood; Use to test blood sugar three times daily  Dispense: 300 each; Refill: 3  Constipation, unspecified constipation type Assessment & Plan: Stable.  Continue senna 8.8 mg per 5 ml as needed.   Difficulty urinating Assessment & Plan: Controlled.  Continue finasteride 5 mg daily.   Feeding by G-tube [facility]) Assessment & Plan: G-tube in place. No complications.   Hyperlipidemia, unspecified hyperlipidemia type Assessment & Plan: Repeat lipid panel pending.  Continue atorvastatin 40 mg daily.  Orders: -     Lipid panel  Paresthesias Assessment & Plan: Controlled.  Continue gabapentin 250/5 mg/ml, 2.5 to 5 mL at bedtime as needed.   Influenza A Assessment & Plan: Positive result today in the office.  Start Tamiflu  75 mg twice daily x 5 days.  Orders: -     Oseltamivir Phosphate; Take  1 capsule (75 mg total) by mouth 2 (two) times daily for 5 days.  Dispense: 10 capsule; Refill: 0  Acute cough -     POC COVID-19 BinaxNow -     POCT Influenza A/B  Screening for prostate cancer -     PSA, Medicare        Doreene Nest, NP

## 2023-10-14 NOTE — Assessment & Plan Note (Signed)
Controlled. Remain off treatment.

## 2023-10-14 NOTE — Assessment & Plan Note (Signed)
Immunizations UTD. Colonoscopy UTD, due 2028 PSA due and pending.  Discussed the importance of a healthy diet and regular exercise in order for weight loss, and to reduce the risk of further co-morbidity.  Exam stable. Labs pending.  Follow up in 1 year for repeat physical.

## 2023-10-14 NOTE — Assessment & Plan Note (Addendum)
Repeat A1C pending.  Remain off treatment.  Urine microalbumin due and pending.  Follow-up in 6 months.

## 2023-10-14 NOTE — Assessment & Plan Note (Signed)
Following with neurology, office notes reviewed from July 2024.  Continue nightly CPAP.

## 2023-10-14 NOTE — Assessment & Plan Note (Signed)
G-tube in place. No complications.

## 2023-10-14 NOTE — Assessment & Plan Note (Signed)
Stable.  PEG tube in place, no complications.

## 2023-10-14 NOTE — Assessment & Plan Note (Signed)
Controlled.  Continue finasteride 5 mg daily. 

## 2023-10-14 NOTE — Assessment & Plan Note (Signed)
Positive result today in the office.  Start Tamiflu 75 mg twice daily x 5 days.

## 2023-10-14 NOTE — Assessment & Plan Note (Signed)
Controlled.  Continue gabapentin 250/5 mg/ml, 2.5 to 5 mL at bedtime as needed.

## 2023-10-14 NOTE — Assessment & Plan Note (Signed)
Stable.  Continue senna 8.8 mg per 5 ml as needed.

## 2023-10-14 NOTE — Assessment & Plan Note (Signed)
Repeat lipid panel pending. Continue atorvastatin 40 mg daily.

## 2023-10-15 LAB — LIPID PANEL
Cholesterol: 104 mg/dL (ref 0–200)
HDL: 54.9 mg/dL (ref >39.00)
LDL Cholesterol: 42 mg/dL (ref 0–99)
NonHDL: 48.78
Total CHOL/HDL Ratio: 2
Triglycerides: 34 mg/dL (ref 0.0–149.0)
VLDL: 6.8 mg/dL (ref 0.0–40.0)

## 2023-10-15 LAB — COMPREHENSIVE METABOLIC PANEL
ALT: 15 U/L (ref 0–53)
AST: 24 U/L (ref 0–37)
Albumin: 4 g/dL (ref 3.5–5.2)
Alkaline Phosphatase: 67 U/L (ref 39–117)
BUN: 20 mg/dL (ref 6–23)
CO2: 30 meq/L (ref 19–32)
Calcium: 9 mg/dL (ref 8.4–10.5)
Chloride: 98 meq/L (ref 96–112)
Creatinine, Ser: 0.86 mg/dL (ref 0.40–1.50)
GFR: 90.33 mL/min (ref >60.00)
Glucose, Bld: 87 mg/dL (ref 70–99)
Potassium: 4.5 meq/L (ref 3.5–5.1)
Sodium: 137 meq/L (ref 135–145)
Total Bilirubin: 0.6 mg/dL (ref 0.2–1.2)
Total Protein: 7.4 g/dL (ref 6.0–8.3)

## 2023-10-15 LAB — CBC
HCT: 40.8 % (ref 39.0–52.0)
Hemoglobin: 13.7 g/dL (ref 13.0–17.0)
MCHC: 33.6 g/dL (ref 30.0–36.0)
MCV: 91.9 fL (ref 78.0–100.0)
Platelets: 165 10*3/uL (ref 150.0–400.0)
RBC: 4.44 Mil/uL (ref 4.22–5.81)
RDW: 12.6 % (ref 11.5–15.5)
WBC: 7.7 10*3/uL (ref 4.0–10.5)

## 2023-10-15 LAB — MICROALBUMIN / CREATININE URINE RATIO
Creatinine,U: 137.2 mg/dL
Microalb Creat Ratio: 14.5 mg/g (ref 0.0–30.0)
Microalb, Ur: 19.9 mg/dL — ABNORMAL HIGH (ref 0.0–1.9)

## 2023-10-15 LAB — PSA, MEDICARE: PSA: 1.35 ng/mL (ref 0.10–4.00)

## 2023-10-15 LAB — HEMOGLOBIN A1C: Hgb A1c MFr Bld: 5.8 % (ref 4.6–6.5)

## 2023-10-18 DIAGNOSIS — G4733 Obstructive sleep apnea (adult) (pediatric): Secondary | ICD-10-CM | POA: Diagnosis not present

## 2023-10-20 DIAGNOSIS — Z931 Gastrostomy status: Secondary | ICD-10-CM | POA: Diagnosis not present

## 2023-10-20 DIAGNOSIS — E119 Type 2 diabetes mellitus without complications: Secondary | ICD-10-CM | POA: Diagnosis not present

## 2023-10-20 DIAGNOSIS — R1312 Dysphagia, oropharyngeal phase: Secondary | ICD-10-CM | POA: Diagnosis not present

## 2023-11-18 ENCOUNTER — Other Ambulatory Visit: Payer: Self-pay | Admitting: Primary Care

## 2023-11-18 DIAGNOSIS — I69398 Other sequelae of cerebral infarction: Secondary | ICD-10-CM

## 2023-11-18 DIAGNOSIS — Z8673 Personal history of transient ischemic attack (TIA), and cerebral infarction without residual deficits: Secondary | ICD-10-CM

## 2023-11-18 DIAGNOSIS — R6889 Other general symptoms and signs: Secondary | ICD-10-CM

## 2023-11-18 DIAGNOSIS — G4733 Obstructive sleep apnea (adult) (pediatric): Secondary | ICD-10-CM | POA: Diagnosis not present

## 2023-11-26 DIAGNOSIS — Z931 Gastrostomy status: Secondary | ICD-10-CM | POA: Diagnosis not present

## 2023-11-26 DIAGNOSIS — E119 Type 2 diabetes mellitus without complications: Secondary | ICD-10-CM | POA: Diagnosis not present

## 2023-11-26 DIAGNOSIS — R1312 Dysphagia, oropharyngeal phase: Secondary | ICD-10-CM | POA: Diagnosis not present

## 2023-12-03 ENCOUNTER — Other Ambulatory Visit: Payer: Self-pay | Admitting: Primary Care

## 2023-12-03 DIAGNOSIS — E785 Hyperlipidemia, unspecified: Secondary | ICD-10-CM

## 2024-01-04 DIAGNOSIS — H43823 Vitreomacular adhesion, bilateral: Secondary | ICD-10-CM | POA: Diagnosis not present

## 2024-01-04 DIAGNOSIS — Z961 Presence of intraocular lens: Secondary | ICD-10-CM | POA: Diagnosis not present

## 2024-01-04 DIAGNOSIS — H35033 Hypertensive retinopathy, bilateral: Secondary | ICD-10-CM | POA: Diagnosis not present

## 2024-01-04 DIAGNOSIS — E113413 Type 2 diabetes mellitus with severe nonproliferative diabetic retinopathy with macular edema, bilateral: Secondary | ICD-10-CM | POA: Diagnosis not present

## 2024-01-10 ENCOUNTER — Ambulatory Visit (INDEPENDENT_AMBULATORY_CARE_PROVIDER_SITE_OTHER): Payer: Medicare HMO | Admitting: Podiatry

## 2024-01-10 ENCOUNTER — Encounter: Payer: Self-pay | Admitting: Podiatry

## 2024-01-10 VITALS — Ht 72.0 in | Wt 239.0 lb

## 2024-01-10 DIAGNOSIS — M79674 Pain in right toe(s): Secondary | ICD-10-CM

## 2024-01-10 DIAGNOSIS — E1165 Type 2 diabetes mellitus with hyperglycemia: Secondary | ICD-10-CM | POA: Diagnosis not present

## 2024-01-10 DIAGNOSIS — B351 Tinea unguium: Secondary | ICD-10-CM | POA: Diagnosis not present

## 2024-01-10 DIAGNOSIS — M79675 Pain in left toe(s): Secondary | ICD-10-CM | POA: Diagnosis not present

## 2024-01-10 NOTE — Progress Notes (Signed)
 This patient presents  to my office for at risk foot care.  This patient requires this care by a professional since this patient will be at risk due to having diabetes,CVA and renal insufficiency.  This patient is unable to cut nails himself since the patient cannot reach his nails.These nails are painful walking and wearing shoes. He presents to the office with caregiver. This patient presents for at risk foot care today.  General Appearance  Alert, conversant and in no acute stress.  Vascular  Dorsalis pedis are weakly  palpable  bilaterally. Posterior tibial pulses are absent due to swelling  Bilateral.  Capillary return is within normal limits  bilaterally. Cold feet  bilaterally.  Neurologic  Senn-Weinstein monofilament wire test diminished right foot. LOPS WNL left foot. Muscle power within normal limits bilaterally.  Nails Thick disfigured discolored nails with subungual debris  from hallux to fifth toes bilaterally. No evidence of bacterial infection or drainage bilaterally.   Orthopedic  No limitations of motion  feet .  No crepitus or effusions noted.  HAV  B/L.  Skin  normotropic skin with no porokeratosis noted bilaterally.  No signs of infections or ulcers noted.     Onychomycosis  Pain in right toes  Pain in left toes  Injury right hallux nail plate.  Consent was obtained for treatment procedures.   Mechanical debridement of nails 1-5  bilaterally performed with a nail nipper.  Filed with dremel without incident. Patient is eligible for diabetic shoes.   Return office visit   4   months                  Told patient to return for periodic foot care and evaluation due to potential at risk complications.   Helane Gunther DPM

## 2024-01-14 DIAGNOSIS — Z931 Gastrostomy status: Secondary | ICD-10-CM | POA: Diagnosis not present

## 2024-01-14 DIAGNOSIS — R1312 Dysphagia, oropharyngeal phase: Secondary | ICD-10-CM | POA: Diagnosis not present

## 2024-01-14 DIAGNOSIS — E119 Type 2 diabetes mellitus without complications: Secondary | ICD-10-CM | POA: Diagnosis not present

## 2024-02-15 DIAGNOSIS — R1314 Dysphagia, pharyngoesophageal phase: Secondary | ICD-10-CM | POA: Diagnosis not present

## 2024-02-15 DIAGNOSIS — R1312 Dysphagia, oropharyngeal phase: Secondary | ICD-10-CM | POA: Diagnosis not present

## 2024-02-25 DIAGNOSIS — E11311 Type 2 diabetes mellitus with unspecified diabetic retinopathy with macular edema: Secondary | ICD-10-CM | POA: Diagnosis not present

## 2024-02-25 DIAGNOSIS — N400 Enlarged prostate without lower urinary tract symptoms: Secondary | ICD-10-CM | POA: Diagnosis not present

## 2024-02-25 DIAGNOSIS — R600 Localized edema: Secondary | ICD-10-CM | POA: Diagnosis not present

## 2024-02-25 DIAGNOSIS — I10 Essential (primary) hypertension: Secondary | ICD-10-CM | POA: Diagnosis not present

## 2024-02-25 DIAGNOSIS — Z6837 Body mass index (BMI) 37.0-37.9, adult: Secondary | ICD-10-CM | POA: Diagnosis not present

## 2024-02-25 DIAGNOSIS — K219 Gastro-esophageal reflux disease without esophagitis: Secondary | ICD-10-CM | POA: Diagnosis not present

## 2024-02-25 DIAGNOSIS — G4733 Obstructive sleep apnea (adult) (pediatric): Secondary | ICD-10-CM | POA: Diagnosis not present

## 2024-02-25 DIAGNOSIS — E1142 Type 2 diabetes mellitus with diabetic polyneuropathy: Secondary | ICD-10-CM | POA: Diagnosis not present

## 2024-02-25 DIAGNOSIS — E785 Hyperlipidemia, unspecified: Secondary | ICD-10-CM | POA: Diagnosis not present

## 2024-02-25 DIAGNOSIS — K5900 Constipation, unspecified: Secondary | ICD-10-CM | POA: Diagnosis not present

## 2024-02-25 DIAGNOSIS — Z008 Encounter for other general examination: Secondary | ICD-10-CM | POA: Diagnosis not present

## 2024-02-25 DIAGNOSIS — I69351 Hemiplegia and hemiparesis following cerebral infarction affecting right dominant side: Secondary | ICD-10-CM | POA: Diagnosis not present

## 2024-03-16 DIAGNOSIS — G4733 Obstructive sleep apnea (adult) (pediatric): Secondary | ICD-10-CM | POA: Diagnosis not present

## 2024-03-28 ENCOUNTER — Other Ambulatory Visit: Payer: Self-pay | Admitting: Physical Medicine & Rehabilitation

## 2024-03-28 DIAGNOSIS — E1142 Type 2 diabetes mellitus with diabetic polyneuropathy: Secondary | ICD-10-CM

## 2024-04-07 DIAGNOSIS — E119 Type 2 diabetes mellitus without complications: Secondary | ICD-10-CM | POA: Diagnosis not present

## 2024-04-07 DIAGNOSIS — Z931 Gastrostomy status: Secondary | ICD-10-CM | POA: Diagnosis not present

## 2024-04-07 DIAGNOSIS — R1312 Dysphagia, oropharyngeal phase: Secondary | ICD-10-CM | POA: Diagnosis not present

## 2024-04-10 DIAGNOSIS — H35033 Hypertensive retinopathy, bilateral: Secondary | ICD-10-CM | POA: Diagnosis not present

## 2024-04-10 DIAGNOSIS — E113413 Type 2 diabetes mellitus with severe nonproliferative diabetic retinopathy with macular edema, bilateral: Secondary | ICD-10-CM | POA: Diagnosis not present

## 2024-04-10 DIAGNOSIS — H43823 Vitreomacular adhesion, bilateral: Secondary | ICD-10-CM | POA: Diagnosis not present

## 2024-04-10 DIAGNOSIS — Z961 Presence of intraocular lens: Secondary | ICD-10-CM | POA: Diagnosis not present

## 2024-04-13 ENCOUNTER — Ambulatory Visit: Payer: Medicare HMO | Admitting: Primary Care

## 2024-04-13 ENCOUNTER — Ambulatory Visit: Payer: Self-pay | Admitting: Primary Care

## 2024-04-13 ENCOUNTER — Encounter: Payer: Self-pay | Admitting: Primary Care

## 2024-04-13 ENCOUNTER — Ambulatory Visit
Admission: RE | Admit: 2024-04-13 | Discharge: 2024-04-13 | Disposition: A | Discharge status: Home / Self Care | Source: Ambulatory Visit | Attending: Primary Care | Admitting: Primary Care

## 2024-04-13 VITALS — BP 132/74 | HR 73 | Temp 98.2°F | Ht 72.0 in | Wt 247.0 lb

## 2024-04-13 DIAGNOSIS — G8929 Other chronic pain: Secondary | ICD-10-CM | POA: Diagnosis not present

## 2024-04-13 DIAGNOSIS — R202 Paresthesia of skin: Secondary | ICD-10-CM | POA: Diagnosis not present

## 2024-04-13 DIAGNOSIS — E1165 Type 2 diabetes mellitus with hyperglycemia: Secondary | ICD-10-CM

## 2024-04-13 DIAGNOSIS — M47812 Spondylosis without myelopathy or radiculopathy, cervical region: Secondary | ICD-10-CM | POA: Diagnosis not present

## 2024-04-13 LAB — POCT GLYCOSYLATED HEMOGLOBIN (HGB A1C): Hemoglobin A1C: 5.3 % (ref 4.0–5.6)

## 2024-04-13 NOTE — Progress Notes (Signed)
 Subjective:    Patient ID: Michael Bright, male    DOB: 1956-04-13, 68 y.o.   MRN: [id number]  HPI  Michael Bright is a very pleasant 68 y.o. male with a history of hypertension, type 2 diabetes, CVA, hyperlipidemia, feeding by G-tube who presents today for follow-up of diabetes and paresthesias.  His wife joins us  today.   1) Type 2 Diabetes: Current medications include: None  He is checking his blood glucose 1 times daily and is getting readings of: 70-80s.   AM fasting:   Last A1C: 5.8 in December 2024, Last Eye Exam: Date up-to-date Last Foot Exam: Due Pneumonia Vaccination: 2023 Urine Microalbumin: Up-to-date Statin: Atorvastatin   Dietary changes since last visit: No changes, tube feedings.    Exercise: No walking   Wt Readings from Last 3 Encounters:  04/13/24 247 lb (112 kg)  01/10/24 239 lb (108.4 kg)  10/14/23 239 lb (108.4 kg)     BP Readings from Last 3 Encounters:  04/13/24 132/74  10/14/23 128/64  07/07/23 118/73   2) Paresthesias: Chronic to the right upper extremity since his stroke several years ago.  Since his stroke he has felt paresthesias and a cold sensation to his right upper extremity with numbness to the 2nd and 3rd digits.  He is managed on gabapentin  but it causes drowsiness.  He does have a history of neck pain, overall mild.  Review of Systems  Respiratory:  Negative for shortness of breath.   Cardiovascular:  Negative for chest pain.  Musculoskeletal:  Positive for neck pain.  Neurological:  Positive for numbness.         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   Diabetes (HCC)    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 (CVA) 08/09/2019   Pneumonia    aspiration pneumonia   Renal  insufficiency 05/30/2019   Sepsis with acute renal failure without septic shock (HCC)    Septic shock (HCC)    Status post insertion of percutaneous endoscopic gastrostomy (PEG) tube (HCC)    Stroke (HCC)    Urinary tract infection    Urinary tract infection without hematuria     Social History   Socioeconomic History   Marital status: Married    Spouse name: Not on file   Number of children: Not on file   Years of education: Not on file   Highest education level: Not on file  Occupational History   Not on file  Tobacco Use   Smoking status: Never   Smokeless tobacco: Never  Vaping Use   Vaping status: Never Used  Substance and Sexual Activity   Alcohol use: Yes   Drug use: No   Sexual activity: Yes  Other Topics Concern   Not on file  Social History Narrative   Not on file   Social Drivers of Health   Financial Resource Strain: Low Risk  (08/17/2023)   Overall Financial Resource Strain (CARDIA)    Difficulty of Paying Living Expenses: Not hard at all  Food Insecurity: No Food Insecurity (08/17/2023)   Hunger Vital Sign    Worried About Running Out of Food in the Last Year: Never true    Ran Out of Food in the Last Year: Never true  Transportation Needs: No  Transportation Needs (08/17/2023)   PRAPARE - Administrator, Civil Service (Medical): No    Lack of Transportation (Non-Medical): No  Physical Activity: Inactive (08/17/2023)   Exercise Vital Sign    Days of Exercise per Week: 0 days    Minutes of Exercise per Session: 0 min  Stress: No Stress Concern Present (08/17/2023)   [employer] of Occupational Health - Occupational Stress Questionnaire    Feeling of Stress : Not at all  Social Connections: Moderately Isolated (08/17/2023)   Social Connection and Isolation Panel    Frequency of Communication with Friends and Family: Three times a week    Frequency of Social Gatherings with Friends and Family: Three times a week    Attends Religious  Services: Never    Active Member of Clubs or Organizations: No    Attends Banker Meetings: Never    Marital Status: Married  Catering manager Violence: Not At Risk (08/17/2023)   Humiliation, Afraid, Rape, and Kick questionnaire    Fear of Current or Ex-Partner: No    Emotionally Abused: No    Physically Abused: No    Sexually Abused: No    Past Surgical History:  Procedure Laterality Date   CHOLECYSTECTOMY     IR GASTROSTOMY TUBE MOD SED  07/04/2019   IR GASTROSTOMY TUBE MOD SED  10/08/2022   IR REPLC GASTRO/COLONIC TUBE PERCUT W/FLUORO  12/11/2021   IR REPLC GASTRO/COLONIC TUBE PERCUT W/FLUORO  10/02/2022   IR REPLC GASTRO/COLONIC TUBE PERCUT W/FLUORO  03/25/2023    Family History  Adopted: Yes    No Known Allergies  Current Outpatient Medications on File Prior to Visit  Medication Sig Dispense Refill   Accu-Chek FastClix Lancets MISC USE AS DIRECTED UP TO FOUR TIMES DAILY 102 each 5   acetaminophen  (TYLENOL ) 325 MG tablet 325-650 mg by Per G Tube route as needed.     aspirin  81 MG chewable tablet 81 mg by Per G Tube route daily.     atorvastatin  (LIPITOR) 40 MG tablet TAKE 1 TABLET (40 MG TOTAL) BY MOUTH AT BEDTIME. FOR CHOLESTEROL. 90 tablet 2   chlorhexidine  (PERIDEX ) 0.12 % solution RINSE AND SPIT WITH 15 MLS BY MOUTH 2 (TWO) TIMES DAILY. 473 mL 6   famotidine  (PEPCID ) 20 MG tablet TAKE 1 TABLET BY MOUTH TWICE A DAY 180 tablet 3   finasteride  (PROSCAR ) 5 MG tablet PLACE ONE TABLET IN GI TUBE ONCE DAILY FOR URINE FLOW. 90 tablet 2   fluticasone  (FLONASE ) 50 MCG/ACT nasal spray PLACE 1 SPRAY INTO BOTH NOSTRILS 2 (TWO) TIMES DAILY AS NEEDED FOR ALLERGIES OR RHINITIS. 16 mL 2   gabapentin  (NEURONTIN ) 250 MG/5ML solution TAKE 2.5-5 MLS (125-250 MG TOTAL) BY MOUTH AT BEDTIME. FOR PAIN. 150 mL 6   glucose blood test strip Use to test blood sugar three times daily 300 each 3   glycopyrrolate  (ROBINUL ) 1 MG tablet PLACE 2 TABLETS (2 MG TOTAL) INTO FEEDING TUBE 3 (THREE)  TIMES DAILY. FOR ORAL SECRETIONS. 540 tablet 2   mouth rinse LIQD solution 15 mLs by Mouth Rinse route 5 (five) times daily. 946 mL 1   Nutritional Supplements (FEEDING SUPPLEMENT, OSMOLITE 1.5 CAL,) LIQD Place 325 mLs into feeding tube 4 (four) times daily.  0   Water  For Irrigation, Sterile (FREE WATER ) SOLN Place 200 mLs into feeding tube every 8 (eight) hours. Use filtered or bottled water  (not distilled water )     guaifenesin  (ROBITUSSIN) 100 MG/5ML syrup PLACE 10 MLS (200  MG TOTAL) INTO FEEDING TUBE 4 (FOUR) TIMES DAILY - WITH MEALS AND AT BEDTIME. (Patient not taking: Reported on 04/13/2024) 948 mL 0   No current facility-administered medications on file prior to visit.    BP 132/74   Pulse 73   Temp 98.2 F (36.8 C) (Temporal)   Ht 6' (1.829 m)   Wt 247 lb (112 kg)   SpO2 98%   BMI 33.50 kg/m  Objective:   Physical Exam  Cardiovascular:     Rate and Rhythm: Normal rate and regular rhythm.  Pulmonary:     Effort: Pulmonary effort is normal.     Breath sounds: Normal breath sounds.   Musculoskeletal:     Cervical back: Neck supple.   Skin:    General: Skin is warm and dry.   Neurological:     Mental Status: He is alert and oriented to person, place, and time.   Psychiatric:        Mood and Affect: Mood normal.           Assessment & Plan:  Controlled type 2 diabetes mellitus with hyperglycemia, without long-term current use of insulin  (HCC) Assessment & Plan: Well-controlled with A1c at 5.3 today! Remain off treatment.  Foot exam today.  Follow-up in 6 months.  Orders: -     POCT glycosylated hemoglobin (Hb A1C)  Paresthesias Assessment & Plan: Stable but bothersome.  Will check plain films of the cervical spine to rule out cervical causes. Add 2.5 mL of gabapentin  during the day, continue 5 mL at night.  Await results.  Orders: -     DG Cervical Spine 2 or 3 views        Comer MARLA Gaskins, NP

## 2024-04-13 NOTE — Assessment & Plan Note (Signed)
 Stable but bothersome.  Will check plain films of the cervical spine to rule out cervical causes. Add 2.5 mL of gabapentin  during the day, continue 5 mL at night.  Await results.

## 2024-04-13 NOTE — Assessment & Plan Note (Signed)
 Well-controlled with A1c at 5.3 today! Remain off treatment.  Foot exam today.  Follow-up in 6 months.

## 2024-04-13 NOTE — Patient Instructions (Signed)
 Complete xray(s) prior to leaving today. I will notify you of your results once received.  Add 2.5 mL of gabapentin  during the day, continue 5 mL at night.  Please schedule a physical to meet with me in 6 months.   It was a pleasure to see you today!

## 2024-05-02 NOTE — Progress Notes (Unsigned)
 [facility] [address] [place]. [place] [address] [phone]       OFFICE FOLLOW UP NOTE  Mr. Derren Suydam Date of Birth:  Feb 16, 1956 Medical Record Number:  [id number]    Reason for visit: Stroke and CPAP follow-up    CHIEF COMPLAINT:  No chief complaint on file.    HPI:  Update 05/03/2024 JM: Patient returns for yearly follow-up accompanied by his wife.  Stable from stroke standpoint without new stroke/TIA symptoms.  Residual dysarthria, dysphagia and RUE numbness stable.  Continue use of PEG tube ***.  Ambulates with rolling walker, no recent falls.  Reports compliance on aspirin  and atorvastatin .  Routinely follows with PCP for stroke risk factor management.           Update 04/28/2023 JM: Patient returns for yearly CPAP and stroke follow-up accompanied by his wife.  Doing well from stroke standpoint.  No new stroke/TIA symptoms.  Continued poststroke dysarthria, dysphagia requiring PEG tube and RUE numbness stable.  Denies any recent falls with use of RW.  Continues on aspirin  and atorvastatin .  Closely follows with PCP for stroke risk factor management.  Continued use of CPAP with download report noted below showing satisfactory usage and residual AHI 5.2.  Continues to tolerate well. Does have leaks occasionally but will adjust mask and will resolve.  No concerns regarding CPAP.        Update 04/27/2022 JM: Patient returns for yearly CPAP and stroke follow-up.    Stable from stroke standpoint, no new stroke/TIA symptoms.  Residual dysarthria, dysphagia with use of PEG tube and RUE numbness stable. Use of rollator walker, no recent falls.  Compliant on aspirin  atorvastatin , denies side effects.  Routinely follows with PCP for stroke risk factor management. Does mention issues with BLE swelling and wounds currently being followed by PCP.   Doing well on CPAP.  Review of compliance report as below showing good usage and optimal residual  AHI.  Tolerating CPAP well. Is up to date on supplies.  ESS 10/24.        Update 04/24/2021 JM: Mr. Depaulo returns for 31-month stroke and CPAP follow-up accompanied by his wife  Stable from stroke standpoint without new stroke/TIA symptoms.  Residual dysphagia, dysarthria and RUE numbness stable.  Currently on full liquid diet and use of PEG tube for main nutrition and meds.  Continues to use rolling walker for ambulation-denies any recent falls.  Compliant on aspirin  and atorvastatin  without associated side effects.  Blood pressure today 120/69.  Recent A1c 5.4.  Continues to do well on CPAP.  He did recently receive a new mask but was sent to a size medium when he usually wears a large and has been having difficulty with leaks since then - they have no attempted to reach out to DME company yet to notify them.  He has no other CPAP related concerns.  Epworth Sleepiness Scale 6         Update 10/23/2020 JM: Mr. Winders returns for initial CPAP compliance visit accompanied by his wife. He was previously seen on 08/15/2020 for stroke follow-up.   He reports doing well on CPAP tolerating without difficulty.  Reports improvement of his sleep as well as increased energy during the day.  He continues to follow with [facility] for any needed supplies or CPAP concerns.   Review of compliance report from 09/22/2020 -10/21/2020 shows 29 out of 30 usage days and 24 days greater than 4 hours for 80% compliance.  Average usage 5  hours and 30 minutes.  Residual AHI 3.2 with min pressure 80 and max pressure 16 with a EPR level 2.  Leaks in the 95th percentile 33.6.  Pressure in the 95th percentile 9.4.   Update 08/15/2020 JM: Mr. Hollingsworth returns for stroke follow-up accompanied by his son.  Reports residual deficits of RUE numbness and dysarthria which has been improving and dysphagia with limited oral intake and primary use of G tube.  He was found to have esophageal strictures on EGD after SLP referred him to GI.   These were stretched on 8/16 but has not noticed much benefit.  He plans on undergoing barium swallow study in the near future and continues to follow closely with ENT and speech therapy. Denies new or worsening stroke/TIA symptoms.  Remains on aspirin  and atorvastatin  for secondary stroke prevention without side effects.  Blood pressure today 157/88.  He does report bilateral lower extremity paresthesias likely diabetic neuropathy interfering with ambulation and sleep.  Currently on gabapentin  125-250mg  nightly without benefit.  Denies any side effects on current dose.  Establish care with [facility] sleep [facility] Dr. Buck in 04/2020 undergoing HST confirming severe OSA and was provided with a new CPAP machine and supplies.  He received his CPAP machine approximately 2 weeks ago and reports using nightly tolerating well without difficulty.  No further concerns at this time.  Update 04/07/2020 JM: Mr. Square is being seen for stroke follow-up accompanied by his wife.  Residual deficits of RUE altered sensation and dysphagia.  Continues to work with outpatient therapy with ongoing improvement.  Continues to receive primary nutrition via G-tube but is able to eat soft food and drink regular liquids without difficulty.  He continues to experience acid reflux type symptoms and apparently was not able to initiate pantoprazole  as he reports insurance declined coverage despite PCP recently providing refill.  Continues on aspirin  81 mg daily and atorvastatin  40 mg daily.  Blood pressure today 136/78.  He requests establishing care with new sleep provider as he is due for new CPAP machine and does not have current provider.  No further concerns at this time.  Update 01/01/2020 JM: Mr. Newberry is a 68 year old male who is being seen today, 01/01/2020, for stroke follow-up accompanied by his wife.  He has been stable from a stroke standpoint with residual deficits of right hemiparesis, RUE paresthesias and dysphagia s/p PEG tube.  Continues to work with outpatient therapy with ongoing improvement.  Continues to use Rollator walker at all times and denies any recent falls.  Ongoing use of PEG tube but has been advanced to NTL without difficulty.  He does endorse increased acid reflux symptoms over the past month which is also required increased suctioning.  He does report use of tomato juice during therapy.  Continues cyclic tube feeding and recently evaluated nutrition who changed tube feed formulation due to constipation and weight gain -has not yet started new feeding.  Continues on gabapentin  250 mg nightly for RUE pain but endorses ongoing constant warm sensation.  Completed 3 months DAPT and continues on aspirin  alone without bleeding or bruising.  Continues on atorvastatin  40 mg daily without myalgias.  Recent A1c 5.7.  Continues to follow PCP for HTN, HLD and DM management.  Blood pressure today 136/76. No further concerns at this time  Initial visit 09/11/2019 JM: Mr. Camps is a 67 year old male who is being seen today for hospital follow-up accompanied by his wife.  He has been doing well since discharge with residual right-sided  weakness, imbalance and dysphagia.  He does endorse ongoing improvement with home health therapies and plans on transitioning to outpatient therapies once completed.  He does endorse occasional sharp stabbing sensation with flexion of his left wrist and will resolve with extension.  Denies radiating pain in the wrist or fingers.  He will also experience intermittent shock type sensations on right upper and lower extremity.  He was started on gabapentin  250 mg at bedtime with little improvement.  He continues to use a rolling walker for ambulation as well as AFO brace and denies any recent falls.  Remains n.p.o. and receives all nutrition, hydration and medications through PEG tube.  He does endorse ongoing difficulty with excretions and does have suction device at home for assistance.  Improvement of  neurogenic bladder and has been voiding independently.  PCP recently discontinued citalopram  as he did not have residual depression/anxiety symptoms.  Depression/anxiety has been stable at this time.  He has continued on aspirin  and Plavix  without bleeding or bruising.  Continues on atorvastatin  without myalgias.  Recent lipid panel obtained by PCP with improvement of LDL 55.  Blood pressure today satisfactory at 125/95.  Glucose levels greatly improved with recent A1c 7.3.  No further concerns at this time.  Stroke admission 05/30/2019: Mr. Dezmon Conover is a 68 y.o. male with hx DB who stopped meds 10 yrs ago and morbid obesity presenting to [facility] ED with dysarthria, left facial droop, double vision and gait unsteadiness since Sunday 8/9.  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  CTA head/neck negative LVO with diffuse intracranial stenosis moderate supraclinoid bilateral ICAs and mild stenosis left VA origin.  2D echo normal EF without cardiac source of embolus identified.  LDL 135.  A1c 10.6.  Recommended DAPT for 3 months then aspirin  alone.  Blood pressure stable during admission without prior history of HTN.  Initiated atorvastatin  40 mg daily.  New diagnosis of DM and recommended close PCP follow-up.  Other stroke is factors include morbid obesity and EtOH but no prior history of stroke.  Hospital course complicated by hypoxemic respiratory failure secondary to medullary stroke intubated on 8/15 with trach placement on 8/21, possible aspiration pneumonia secondary to medullary stroke and dysphagia secondary to medullary stroke with placement of PEG tube on 9/15.  He was discharged to CIR on 06/27/2019 for ongoing therapy with residual deficits.  During admission to CIR, he had improvement of respiratory status and was decannulated on 9/22.  Difficulty with neurogenic bladder requiring intermittent caths and discharged with recommendation on  following up with urology outpatient.  He was discharged home in stable condition with recommendation of home health therapy on 07/17/2019.  He unfortunately was readmitted on 07/20/2019 with septic shock due to Klebsiella pneumoniae bacteremia with UTI and hypoxic respiratory failure bilateral lung infiltrates due to rhinovirus.  Required intubation briefly and treated with broad-spectrum antibiotics eventually narrowed with recommendation of 2-week total antibiotic course.  Hospital course significant for AKI with abnormal LFTs due to shock liver, intermittent hypoxic episode as well as significant oral secretions with dysphonia.  He was discharged back to CIR for ongoing therapy due to debility.  Ongoing deficits of severe pharyngeal dysphagia and mild memory deficits affecting recall but showed improvement of activity tolerance, balance and postural control as well as ability to compensate for deficits and was discharged home with home health therapy on 08/08/2019.    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   Diabetes (HCC)    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 (CVA) 08/09/2019   Pneumonia    aspiration pneumonia   Renal insufficiency 05/30/2019   Sepsis with acute renal failure without septic shock (HCC)    Septic shock (HCC)    Status post insertion of percutaneous endoscopic gastrostomy (PEG) tube (HCC)    Stroke (HCC)    Urinary tract infection    Urinary tract infection without hematuria     PSH:  Past Surgical History:  Procedure Laterality Date   CHOLECYSTECTOMY     IR GASTROSTOMY TUBE MOD SED  07/04/2019   IR  GASTROSTOMY TUBE MOD SED  10/08/2022   IR REPLC GASTRO/COLONIC TUBE PERCUT W/FLUORO  12/11/2021   IR REPLC GASTRO/COLONIC TUBE PERCUT W/FLUORO  10/02/2022   IR REPLC GASTRO/COLONIC TUBE PERCUT W/FLUORO  03/25/2023    Social History:  Social History   Socioeconomic History   Marital status: Married    Spouse name: Not on file   Number of children: Not on file   Years of education: Not on file   Highest education level: Not on file  Occupational History   Not on file  Tobacco Use   Smoking status: Never   Smokeless tobacco: Never  Vaping Use   Vaping status: Never Used  Substance and Sexual Activity   Alcohol use: Yes   Drug use: No   Sexual activity: Yes  Other Topics Concern   Not on file  Social History Narrative   Not on file   Social Drivers of Health   Financial Resource Strain: Low Risk  (08/17/2023)   Overall Financial Resource Strain (CARDIA)    Difficulty of Paying Living Expenses: Not hard at all  Food Insecurity: No Food Insecurity (08/17/2023)   Hunger Vital Sign    Worried About Radiation protection practitioner of Food in the Last Year: Never true    Ran Out of Food in the Last Year: Never true  Transportation Needs: No Transportation Needs (08/17/2023)   PRAPARE - Administrator, Civil Service (Medical): No    Lack of Transportation (Non-Medical): No  Physical Activity: Inactive (08/17/2023)   Exercise Vital Sign    Days of Exercise per Week: 0 days    Minutes of Exercise per Session: 0 min  Stress: No Stress Concern Present (08/17/2023)   [employer] of Occupational Health - Occupational Stress Questionnaire    Feeling of Stress : Not at all  Social Connections: Moderately Isolated (08/17/2023)   Social Connection and Isolation Panel    Frequency of Communication with Friends and Family: Three times a week    Frequency of Social Gatherings with Friends and Family: Three times a week    Attends Religious Services: Never    Active Member of Clubs or  Organizations: No    Attends Banker Meetings: Never    Marital Status: Married  Catering manager Violence: Not At Risk (08/17/2023)   Humiliation, Afraid, Rape, and Kick questionnaire    Fear of Current or Ex-Partner: No    Emotionally Abused: No    Physically Abused: No    Sexually Abused:  No    Family History:  Family History  Adopted: Yes    Medications:   Current Outpatient Medications on File Prior to Visit  Medication Sig Dispense Refill   Accu-Chek FastClix Lancets MISC USE AS DIRECTED UP TO FOUR TIMES DAILY 102 each 5   acetaminophen  (TYLENOL ) 325 MG tablet 325-650 mg by Per G Tube route as needed.     aspirin  81 MG chewable tablet 81 mg by Per G Tube route daily.     atorvastatin  (LIPITOR) 40 MG tablet TAKE 1 TABLET (40 MG TOTAL) BY MOUTH AT BEDTIME. FOR CHOLESTEROL. 90 tablet 2   chlorhexidine  (PERIDEX ) 0.12 % solution RINSE AND SPIT WITH 15 MLS BY MOUTH 2 (TWO) TIMES DAILY. 473 mL 6   famotidine  (PEPCID ) 20 MG tablet TAKE 1 TABLET BY MOUTH TWICE A DAY 180 tablet 3   finasteride  (PROSCAR ) 5 MG tablet PLACE ONE TABLET IN GI TUBE ONCE DAILY FOR URINE FLOW. 90 tablet 2   fluticasone  (FLONASE ) 50 MCG/ACT nasal spray PLACE 1 SPRAY INTO BOTH NOSTRILS 2 (TWO) TIMES DAILY AS NEEDED FOR ALLERGIES OR RHINITIS. 16 mL 2   gabapentin  (NEURONTIN ) 250 MG/5ML solution TAKE 2.5-5 MLS (125-250 MG TOTAL) BY MOUTH AT BEDTIME. FOR PAIN. 150 mL 6   glucose blood test strip Use to test blood sugar three times daily 300 each 3   glycopyrrolate  (ROBINUL ) 1 MG tablet PLACE 2 TABLETS (2 MG TOTAL) INTO FEEDING TUBE 3 (THREE) TIMES DAILY. FOR ORAL SECRETIONS. 540 tablet 2   guaifenesin  (ROBITUSSIN) 100 MG/5ML syrup PLACE 10 MLS (200 MG TOTAL) INTO FEEDING TUBE 4 (FOUR) TIMES DAILY - WITH MEALS AND AT BEDTIME. (Patient not taking: Reported on 04/13/2024) 948 mL 0   mouth rinse LIQD solution 15 mLs by Mouth Rinse route 5 (five) times daily. 946 mL 1   Nutritional Supplements (FEEDING  SUPPLEMENT, OSMOLITE 1.5 CAL,) LIQD Place 325 mLs into feeding tube 4 (four) times daily.  0   Water  For Irrigation, Sterile (FREE WATER ) SOLN Place 200 mLs into feeding tube every 8 (eight) hours. Use filtered or bottled water  (not distilled water )     No current facility-administered medications on file prior to visit.    Allergies:  No Known Allergies   Physical Exam  There were no vitals filed for this visit.  There is no height or weight on file to calculate BMI. No results found.  General: Obese very pleasant middle-age African-American male, seated, in no evident distress Head: head normocephalic and atraumatic.   Neck: supple with no carotid or supraclavicular bruits Cardiovascular: regular rate and rhythm, no murmurs Musculoskeletal: no deformity Skin:  no rash/petichiae; PEG tube intact Vascular:  Normal pulses all extremities   Neurologic Exam Mental Status: Awake and fully alert. Mild dysarthria. Oriented to place and time. Recent and remote memory intact. Attention span, concentration and fund of knowledge appropriate. Mood and affect appropriate.  Cranial Nerves: Pupils equal, briskly reactive to light. Extraocular movements full without nystagmus. Visual fields full to confrontation. Hearing intact. Facial sensation intact.  Slight right lower facial weakness Motor: Normal bulk and tone.  Full strength in all tested extremities. Sensory.:  Diminished light touch sensation RUE proximally and decreased sensory RLE distally Coordination: Rapid alternating movements normal in all extremities. Finger-to-nose and heel-to-shin performed accurately bilaterally. Gait and Station: Arises from chair with mild difficulty. Stance is normal. Gait demonstrates  broad-based gait with slightly decreased stride length and step height and mild imbalance with use of Rollator walker Reflexes: 1+  and symmetric. Toes downgoing.       ASSESSMENT/PLAN: Shivaan Tierno is a 68 y.o. 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 Vascular risk factors include severe OSA on CPAP, HTN, HLD, DM, EtOH use and morbid obesity.  Prolonged hospital course 05/2019 due to complications including hypoxic respiratory failure with placement of tracheostomy now removed, PEG tube due to dysphagia post stroke, neurogenic bladder post stroke resolved and aspiration pneumonia.  He returned shortly after CIR discharge due to septic shock requiring additional inpatient rehab stay.       Left lateral medullary stroke: Remains stable from stroke standpoint.  Discussed use of rollator walker at all times for fall prevention.  Continue aspirin  and atorvastatin  40 mg daily for secondary stroke prevention.  Continue to follow closely with PCP for aggressive stroke risk factor management including HTN with BP goal<130/90, HLD with LDL<70 and DM with A1c goal<7  OSA on CPAP Compliance report shows satisfactory usage with optimal residual AHI.  Discussed continued nightly usage with ensuring greater than 4 hours nightly for optimal benefit and per insurance purposes.  Continue to follow with DME company for any needed supplies or CPAP related concerns      Follow-up in 1 year or call earlier if needed   CC: Gretta Comer POUR, NP     I personally spent a total of *** minutes in the care of the patient today including {Time Based Coding:210964241}.   Harlene Bogaert, AGNP-BC  [facility] [address] [place], [place] [address]  Phone [phone] Fax [phone] Note: This document was prepared with digital dictation and possible smart phrase technology. Any transcriptional errors that result from this process are unintentional.

## 2024-05-03 ENCOUNTER — Encounter: Payer: Self-pay | Admitting: Adult Health

## 2024-05-03 ENCOUNTER — Ambulatory Visit: Payer: Medicare HMO | Admitting: Adult Health

## 2024-05-03 VITALS — BP 121/59 | HR 99 | Ht 72.0 in | Wt 250.0 lb

## 2024-05-03 DIAGNOSIS — E1142 Type 2 diabetes mellitus with diabetic polyneuropathy: Secondary | ICD-10-CM | POA: Diagnosis not present

## 2024-05-03 DIAGNOSIS — G464 Cerebellar stroke syndrome: Secondary | ICD-10-CM | POA: Diagnosis not present

## 2024-05-03 DIAGNOSIS — G4733 Obstructive sleep apnea (adult) (pediatric): Secondary | ICD-10-CM | POA: Diagnosis not present

## 2024-05-03 DIAGNOSIS — C009 Malignant neoplasm of lip, unspecified: Secondary | ICD-10-CM | POA: Diagnosis not present

## 2024-05-03 MED ORDER — GABAPENTIN 250 MG/5ML PO SOLN
ORAL | 11 refills | Status: DC
Start: 2024-05-03 — End: 2024-07-05

## 2024-05-03 NOTE — Patient Instructions (Signed)
 Increase gabapentin  to 250mg  AM and 500mg  PM - please let me know after 2-3 weeks if no benefit for dosage increase. If unable to tolerate higher dose, please let me know so we can discuss other treatment options   Continue aspirin  81 mg daily  and atorvastatin   for secondary stroke prevention  Continue nightly use of CPAP ensuring greater than 4 hours per night for optimal benefit. Continue to follow with your DME for any needed supplies or CPAP related concerns   Continue to follow up with PCP regarding blood pressure, cholesterol and diabetes management  Maintain strict control of hypertension with blood pressure goal below 130/90, diabetes with hemoglobin A1c goal below 7.0 % and cholesterol with LDL cholesterol (bad cholesterol) goal below 70 mg/dL.   Signs of a Stroke? Follow the BEFAST method:  Balance Watch for a sudden loss of balance, trouble with coordination or vertigo Eyes Is there a sudden loss of vision in one or both eyes? Or double vision?  Face: Ask the person to smile. Does one side of the face droop or is it numb?  Arms: Ask the person to raise both arms. Does one arm drift downward? Is there weakness or numbness of a leg? Speech: Ask the person to repeat a simple phrase. Does the speech sound slurred/strange? Is the person confused ? Time: If you observe any of these signs, call 911.      Followup in the future with me in 1 year or call earlier if needed       Thank you for coming to see us  at [facility]. I hope we have been able to provide you high quality care today.  You may receive a patient satisfaction survey over the next few weeks. We would appreciate your feedback and comments so that we may continue to improve ourselves and the health of our patients.

## 2024-05-04 NOTE — Progress Notes (Signed)
 Danyeal Akens D, CMA  Joylene Bradley; Garcia, Patricia; Ziegler, Melissa; Tucker, Dolanda; Cain, [place] New orders have been placed for the above pt, DOB: [birth year] Thanks

## 2024-05-15 ENCOUNTER — Ambulatory Visit (INDEPENDENT_AMBULATORY_CARE_PROVIDER_SITE_OTHER): Admitting: Podiatry

## 2024-05-15 ENCOUNTER — Encounter: Payer: Self-pay | Admitting: Podiatry

## 2024-05-15 DIAGNOSIS — E1165 Type 2 diabetes mellitus with hyperglycemia: Secondary | ICD-10-CM | POA: Diagnosis not present

## 2024-05-15 DIAGNOSIS — M79674 Pain in right toe(s): Secondary | ICD-10-CM | POA: Diagnosis not present

## 2024-05-15 DIAGNOSIS — B351 Tinea unguium: Secondary | ICD-10-CM | POA: Diagnosis not present

## 2024-05-15 DIAGNOSIS — M79675 Pain in left toe(s): Secondary | ICD-10-CM

## 2024-05-15 NOTE — Progress Notes (Signed)
 This patient presents  to my office for at risk foot care.  This patient requires this care by a professional since this patient will be at risk due to having diabetes,CVA and renal insufficiency.  This patient is unable to cut nails himself since the patient cannot reach his nails.These nails are painful walking and wearing shoes. He presents to the office with caregiver. This patient presents for at risk foot care today.  General Appearance  Alert, conversant and in no acute stress.  Vascular  Dorsalis pedis are weakly  palpable  bilaterally. Posterior tibial pulses are absent due to swelling  Bilateral.  Capillary return is within normal limits  bilaterally. Cold feet  bilaterally.  Neurologic  Senn-Weinstein monofilament wire test diminished right foot. LOPS WNL left foot. Muscle power within normal limits bilaterally.  Nails Thick disfigured discolored nails with subungual debris  from hallux to fifth toes bilaterally. No evidence of bacterial infection or drainage bilaterally.   Orthopedic  No limitations of motion  feet .  No crepitus or effusions noted.  HAV  B/L.  Skin  normotropic skin with no porokeratosis noted bilaterally.  No signs of infections or ulcers noted.     Onychomycosis  Pain in right toes  Pain in left toes  Injury right hallux nail plate.  Consent was obtained for treatment procedures.   Mechanical debridement of nails 1-5  bilaterally performed with a nail nipper.  Filed with dremel without incident.    Return office visit   3  months                  Told patient to return for periodic foot care and evaluation due to potential at risk complications.   Cordella Bold DPM

## 2024-05-24 ENCOUNTER — Other Ambulatory Visit: Payer: Self-pay | Admitting: Physical Medicine & Rehabilitation

## 2024-06-13 DIAGNOSIS — Z931 Gastrostomy status: Secondary | ICD-10-CM | POA: Diagnosis not present

## 2024-06-13 DIAGNOSIS — E119 Type 2 diabetes mellitus without complications: Secondary | ICD-10-CM | POA: Diagnosis not present

## 2024-06-13 DIAGNOSIS — R1312 Dysphagia, oropharyngeal phase: Secondary | ICD-10-CM | POA: Diagnosis not present

## 2024-06-15 DIAGNOSIS — G4733 Obstructive sleep apnea (adult) (pediatric): Secondary | ICD-10-CM | POA: Diagnosis not present

## 2024-06-28 DIAGNOSIS — H04123 Dry eye syndrome of bilateral lacrimal glands: Secondary | ICD-10-CM | POA: Diagnosis not present

## 2024-06-28 DIAGNOSIS — H26493 Other secondary cataract, bilateral: Secondary | ICD-10-CM | POA: Diagnosis not present

## 2024-06-28 DIAGNOSIS — E113393 Type 2 diabetes mellitus with moderate nonproliferative diabetic retinopathy without macular edema, bilateral: Secondary | ICD-10-CM | POA: Diagnosis not present

## 2024-06-28 DIAGNOSIS — Z961 Presence of intraocular lens: Secondary | ICD-10-CM | POA: Diagnosis not present

## 2024-07-05 ENCOUNTER — Telehealth (HOSPITAL_COMMUNITY): Payer: Self-pay

## 2024-07-05 ENCOUNTER — Encounter: Payer: Medicare HMO | Attending: Physical Medicine & Rehabilitation | Admitting: Physical Medicine & Rehabilitation

## 2024-07-05 ENCOUNTER — Other Ambulatory Visit (HOSPITAL_COMMUNITY): Payer: Self-pay | Admitting: Physical Medicine & Rehabilitation

## 2024-07-05 ENCOUNTER — Encounter: Payer: Self-pay | Admitting: Physical Medicine & Rehabilitation

## 2024-07-05 VITALS — BP 131/72 | HR 76 | Ht 72.0 in | Wt 254.4 lb

## 2024-07-05 DIAGNOSIS — E1142 Type 2 diabetes mellitus with diabetic polyneuropathy: Secondary | ICD-10-CM | POA: Diagnosis not present

## 2024-07-05 DIAGNOSIS — Z794 Long term (current) use of insulin: Secondary | ICD-10-CM | POA: Diagnosis not present

## 2024-07-05 DIAGNOSIS — R1312 Dysphagia, oropharyngeal phase: Secondary | ICD-10-CM | POA: Insufficient documentation

## 2024-07-05 DIAGNOSIS — I69391 Dysphagia following cerebral infarction: Secondary | ICD-10-CM | POA: Insufficient documentation

## 2024-07-05 MED ORDER — GABAPENTIN 250 MG/5ML PO SOLN: ORAL | 11 refills | Status: DC

## 2024-07-05 MED ORDER — CHLORHEXIDINE GLUCONATE 0.12 % MT SOLN: 15.0000 mL | Freq: Two times a day (BID) | OROMUCOSAL | 6 refills | Status: AC

## 2024-07-05 NOTE — Telephone Encounter (Signed)
Called to schedule peg replacement, no answer, left vm. AB

## 2024-07-05 NOTE — Patient Instructions (Signed)
 ALWAYS FEEL FREE TO CALL OUR OFFICE WITH ANY PROBLEMS OR QUESTIONS [phone])  **PLEASE NOTE** ALL MEDICATION REFILL REQUESTS (INCLUDING CONTROLLED SUBSTANCES) NEED TO BE MADE AT LEAST 7 DAYS PRIOR TO REFILL BEING DUE. ANY REFILL REQUESTS INSIDE THAT TIME FRAME MAY RESULT IN DELAYS IN RECEIVING YOUR PRESCRIPTION.                    WEEK 1: 500-250-500MG  OF GABAPENTIN  WEEK 2: 500MG  THREE X DAILY.

## 2024-07-05 NOTE — Progress Notes (Signed)
 Subjective:    Patient ID: Michael Bright, male    DOB: 12-08-1955, 68 y.o.   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 He is having a lot of nerve pain in his right arm. He is on gabapentin  250mg  in am and 500mg  in pm. Pain is continuous and radiates from his shoulder to his hand.   He remains limited with his swallow, on full liquids. He follows up with SLP yearly for progress. He remains on robinul  for secretions and robitussin.  His tether for the plug on his G-tube broke off and he needs a replacement.  He has lost the CAP On it several times as a result.  He stays generally active at home with his rolling walker.  Mood has been generally positive.  Sleep is good.  .   Pain Inventory Average Pain 0 Pain Right Now 0 My pain is no pain  In the last 24 hours, has pain interfered with the following? General activity 0 Relation with others 0 Enjoyment of life 0 What TIME of day is your pain at its worst? no pain Sleep (in general) Good  Pain is worse with: no pain Pain improves with: no pain Relief from Meds: na  Family History  Adopted: Yes   Social History   Socioeconomic History   Marital status: Married    Spouse name: Not on file   Number of children: Not on file   Years of education: Not on file   Highest education level: Not on file  Occupational History   Not on file  Tobacco Use   Smoking status: Never   Smokeless tobacco: Never  Vaping Use   Vaping status: Never Used  Substance and Sexual Activity   Alcohol use: Not Currently   Drug use: No   Sexual activity: Yes  Other Topics Concern   Not on file  Social History Narrative   Not on file   Social Drivers of Health   Financial Resource Strain: Low Risk  (08/17/2023)   Overall Financial Resource Strain (CARDIA)    Difficulty of Paying Living Expenses: Not hard at all  Food Insecurity: No Food Insecurity (08/17/2023)   Hunger Vital Sign    Worried About  Running Out of Food in the Last Year: Never true    Ran Out of Food in the Last Year: Never true  Transportation Needs: No Transportation Needs (08/17/2023)   PRAPARE - Administrator, Civil Service (Medical): No    Lack of Transportation (Non-Medical): No  Physical Activity: Inactive (08/17/2023)   Exercise Vital Sign    Days of Exercise per Week: 0 days    Minutes of Exercise per Session: 0 min  Stress: No Stress Concern Present (08/17/2023)   [employer] of Occupational Health - Occupational Stress Questionnaire    Feeling of Stress : Not at all  Social Connections: Moderately Isolated (08/17/2023)   Social Connection and Isolation Panel    Frequency of Communication with Friends and Family: Three times a week    Frequency of Social Gatherings with Friends and Family: Three times a week    Attends Religious Services: Never    Active Member of Clubs or Organizations: No    Attends Banker Meetings: Never    Marital Status: Married   Past Surgical History:  Procedure Laterality Date   CHOLECYSTECTOMY     IR GASTROSTOMY TUBE MOD SED  07/04/2019   IR  GASTROSTOMY TUBE MOD SED  10/08/2022   IR REPLC GASTRO/COLONIC TUBE PERCUT W/FLUORO  12/11/2021   IR REPLC GASTRO/COLONIC TUBE PERCUT W/FLUORO  10/02/2022   IR REPLC GASTRO/COLONIC TUBE PERCUT W/FLUORO  03/25/2023   Past Surgical History:  Procedure Laterality Date   CHOLECYSTECTOMY     IR GASTROSTOMY TUBE MOD SED  07/04/2019   IR GASTROSTOMY TUBE MOD SED  10/08/2022   IR REPLC GASTRO/COLONIC TUBE PERCUT W/FLUORO  12/11/2021   IR REPLC GASTRO/COLONIC TUBE PERCUT W/FLUORO  10/02/2022   IR REPLC GASTRO/COLONIC TUBE PERCUT W/FLUORO  03/25/2023   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  Opioid Risk Score:   Fall Risk Score:  `1  Depression screen [facility] 2/9     04/13/2024    8:52 AM 08/17/2023    8:42 AM 07/07/2023   11:04 AM 04/07/2023    9:18 AM 08/12/2022   12:54 PM 12/18/2020    1:11 PM 06/19/2020    1:29 PM  Depression screen PHQ 2/9  Decreased Interest 0 0 0 1 0 0 0  Down, Depressed, Hopeless 0 0 0 0 0 0 0  PHQ - 2 Score 0 0 0 1 0 0 0     Review of Systems  Musculoskeletal:        Right neck and arm  All other systems reviewed and are negative.      Objective:   Physical Exam General: No acute distress HEENT: NCAT, EOMI, oral membranes moist Cards: reg rate  Chest: normal effort Abdomen: Soft, NT, ND Skin: dry, intact Extremities: 1+ BL LE edema Psych: pleasant and appropriate   Neuro: SPEECH is hoarse, dysphonic. Right sided sensory loss  arm/leg persists. SABRA strength nearly 5/5 in all 4's. ambulates fairly well with RW. Needs extra time to stand from sitting.  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  Made referral to interventional radiology today.  The last tube was placed over a year ago. 2 Mild cervicalgia: myofascial component        -heat, tylenol .  Seems to be managing pretty well with this. 3 Pain: Having more pain in his right upper  extremity.  Will increase gabapentin  ultimately up to 500 mg 3 times daily with 1 week titration.  If he has any problems with this we can back down to a lower dose. 4. Left lateral epicondylitis        -improved  5.  Mild Dupuytren's contractures of the left third and fourth fingers             - improved 6. Erectile dysfunction        -Viagra  as needed 7. GERD: pepcid  20mg  bid   20  Minutes of face to face patient care time were spent during this visit. All questions were encouraged and answered.  Follow up with me in 12 mos .

## 2024-07-11 DIAGNOSIS — Z961 Presence of intraocular lens: Secondary | ICD-10-CM | POA: Diagnosis not present

## 2024-07-11 DIAGNOSIS — H35033 Hypertensive retinopathy, bilateral: Secondary | ICD-10-CM | POA: Diagnosis not present

## 2024-07-11 DIAGNOSIS — H43823 Vitreomacular adhesion, bilateral: Secondary | ICD-10-CM | POA: Diagnosis not present

## 2024-07-11 DIAGNOSIS — E113413 Type 2 diabetes mellitus with severe nonproliferative diabetic retinopathy with macular edema, bilateral: Secondary | ICD-10-CM | POA: Diagnosis not present

## 2024-07-17 ENCOUNTER — Ambulatory Visit (HOSPITAL_COMMUNITY)
Admission: RE | Admit: 2024-07-17 | Discharge: 2024-07-17 | Disposition: A | Discharge status: Home / Self Care | Source: Ambulatory Visit | Attending: Physical Medicine & Rehabilitation | Admitting: Physical Medicine & Rehabilitation

## 2024-07-17 DIAGNOSIS — K9420 Gastrostomy complication, unspecified: Secondary | ICD-10-CM | POA: Insufficient documentation

## 2024-07-17 DIAGNOSIS — K9429 Other complications of gastrostomy: Secondary | ICD-10-CM | POA: Diagnosis not present

## 2024-07-17 DIAGNOSIS — R1312 Dysphagia, oropharyngeal phase: Secondary | ICD-10-CM

## 2024-07-17 HISTORY — PX: IR REPLC GASTRO/COLONIC TUBE PERCUT W/FLUORO: IMG2333

## 2024-07-17 MED ORDER — LIDOCAINE VISCOUS HCL 2 % MT SOLN
OROMUCOSAL | Status: AC
  Filled 2024-07-17: qty 15

## 2024-07-17 MED ORDER — IOHEXOL 300 MG/ML  SOLN
50.0000 mL | Freq: Once | INTRAMUSCULAR | Status: AC | PRN
  Administered 2024-07-17: 10 mL

## 2024-08-06 ENCOUNTER — Other Ambulatory Visit: Payer: Self-pay | Admitting: Primary Care

## 2024-08-06 ENCOUNTER — Other Ambulatory Visit: Payer: Self-pay | Admitting: Physical Medicine & Rehabilitation

## 2024-08-06 DIAGNOSIS — R1312 Dysphagia, oropharyngeal phase: Secondary | ICD-10-CM

## 2024-08-06 DIAGNOSIS — R6889 Other general symptoms and signs: Secondary | ICD-10-CM

## 2024-08-06 DIAGNOSIS — Z8673 Personal history of transient ischemic attack (TIA), and cerebral infarction without residual deficits: Secondary | ICD-10-CM

## 2024-08-06 DIAGNOSIS — N319 Neuromuscular dysfunction of bladder, unspecified: Secondary | ICD-10-CM

## 2024-08-14 ENCOUNTER — Ambulatory Visit: Admitting: Podiatry

## 2024-08-14 ENCOUNTER — Encounter: Payer: Self-pay | Admitting: Podiatry

## 2024-08-14 DIAGNOSIS — M79674 Pain in right toe(s): Secondary | ICD-10-CM | POA: Diagnosis not present

## 2024-08-14 DIAGNOSIS — B351 Tinea unguium: Secondary | ICD-10-CM

## 2024-08-14 DIAGNOSIS — M79675 Pain in left toe(s): Secondary | ICD-10-CM

## 2024-08-14 DIAGNOSIS — E1165 Type 2 diabetes mellitus with hyperglycemia: Secondary | ICD-10-CM | POA: Diagnosis not present

## 2024-08-14 NOTE — Progress Notes (Signed)
 This patient presents  to my office for at risk foot care.  This patient requires this care by a professional since this patient will be at risk due to having diabetes,CVA and renal insufficiency.  This patient is unable to cut nails himself since the patient cannot reach his nails.These nails are painful walking and wearing shoes. He presents to the office with caregiver. This patient presents for at risk foot care today.  General Appearance  Alert, conversant and in no acute stress.  Vascular  Dorsalis pedis are weakly  palpable  bilaterally. Posterior tibial pulses are absent due to swelling  Bilateral.  Capillary return is within normal limits  bilaterally. Cold feet  bilaterally.  Neurologic  Senn-Weinstein monofilament wire test diminished right foot. LOPS WNL left foot. Muscle power within normal limits bilaterally.  Nails Thick disfigured discolored nails with subungual debris  from hallux to fifth toes bilaterally. No evidence of bacterial infection or drainage bilaterally.   Orthopedic  No limitations of motion  feet .  No crepitus or effusions noted.  HAV  B/L.  Skin  normotropic skin with no porokeratosis noted bilaterally.  No signs of infections or ulcers noted.     Onychomycosis  Pain in right toes  Pain in left toes  Injury right hallux nail plate.  Consent was obtained for treatment procedures.   Mechanical debridement of nails 1-5  bilaterally performed with a nail nipper.  Filed with dremel without incident.    Return office visit   3  months                  Told patient to return for periodic foot care and evaluation due to potential at risk complications.   Cordella Bold DPM

## 2024-08-17 DIAGNOSIS — R1312 Dysphagia, oropharyngeal phase: Secondary | ICD-10-CM | POA: Diagnosis not present

## 2024-08-17 DIAGNOSIS — E119 Type 2 diabetes mellitus without complications: Secondary | ICD-10-CM | POA: Diagnosis not present

## 2024-08-25 ENCOUNTER — Other Ambulatory Visit: Payer: Self-pay | Admitting: Primary Care

## 2024-08-25 DIAGNOSIS — E785 Hyperlipidemia, unspecified: Secondary | ICD-10-CM

## 2024-09-05 ENCOUNTER — Ambulatory Visit (INDEPENDENT_AMBULATORY_CARE_PROVIDER_SITE_OTHER)

## 2024-09-05 VITALS — Ht 72.0 in | Wt 254.0 lb

## 2024-09-05 DIAGNOSIS — Z0000 Encounter for general adult medical examination without abnormal findings: Secondary | ICD-10-CM

## 2024-09-05 NOTE — Patient Instructions (Signed)
 Michael Bright,  Thank you for taking the time for your Medicare Wellness Visit. I appreciate your continued commitment to your health goals. Please review the care plan we discussed, and feel free to reach out if I can assist you further.  Please note that Annual Wellness Visits do not include a physical exam. Some assessments may be limited, especially if the visit was conducted virtually. If needed, we may recommend an in-person follow-up with your provider.  Ongoing Care Seeing your primary care provider every 3 to 6 months helps us  monitor your health and provide consistent, personalized care.   Referrals If a referral was made during today's visit and you haven't received any updates within two weeks, please contact the referred provider directly to check on the status.  Recommended Screenings:  Health Maintenance  Topic Date Due   DTaP/Tdap/Td vaccine (1 - Tdap) Never done   Yearly kidney health urinalysis for diabetes  08/23/2021   Zoster (Shingles) Vaccine (2 of 2) 10/02/2021   Flu Shot  05/19/2024   COVID-19 Vaccine (7 - 2025-26 season) 06/19/2024   Medicare Annual Wellness Visit  08/16/2024   Yearly kidney function blood test for diabetes  10/13/2024   Hemoglobin A1C  10/13/2024   Complete foot exam   04/13/2025   Eye exam for diabetics  07/11/2025   Colon Cancer Screening  10/24/2026   Pneumococcal Vaccine for age over 48  Completed   Hepatitis C Screening  Completed   Meningitis B Vaccine  Aged Out       09/05/2024   10:14 AM  Advanced Directives  Does Patient Have a Medical Advance Directive? No    Vision: Annual vision screenings are recommended for early detection of glaucoma, cataracts, and diabetic retinopathy. These exams can also reveal signs of chronic conditions such as diabetes and high blood pressure.  Dental: Annual dental screenings help detect early signs of oral cancer, gum disease, and other conditions linked to overall health, including heart disease  and diabetes.

## 2024-09-05 NOTE — Progress Notes (Signed)
 Chief Complaint  Patient presents with   Medicare Wellness     Subjective:   Michael Bright is a 68 y.o. male who presents for a Medicare Annual Wellness Visit.  Allergies (verified) Patient has no known allergies.   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   Pneumonia    aspiration pneumonia   Renal insufficiency 05/30/2019   Sepsis with acute renal failure without septic shock (HCC)    Septic shock (HCC)    Status post insertion of percutaneous endoscopic gastrostomy (PEG) tube (HCC)    Stroke (HCC)    Urinary tract infection    Urinary tract infection without hematuria    Past Surgical History:  Procedure Laterality Date   CHOLECYSTECTOMY     IR GASTROSTOMY TUBE MOD SED  07/04/2019   IR GASTROSTOMY TUBE MOD SED  10/08/2022   IR REPLC GASTRO/COLONIC TUBE PERCUT W/FLUORO  12/11/2021   IR REPLC GASTRO/COLONIC TUBE PERCUT W/FLUORO  10/02/2022   IR REPLC GASTRO/COLONIC TUBE PERCUT W/FLUORO  03/25/2023   IR REPLC GASTRO/COLONIC TUBE PERCUT W/FLUORO  07/17/2024   Family History  Adopted: Yes   Social History   Occupational History   Not on file  Tobacco Use   Smoking status: Never   Smokeless tobacco: Never  Vaping Use   Vaping status: Never Used  Substance and Sexual Activity   Alcohol use: Not Currently   Drug use: No   Sexual activity: Yes   Tobacco Counseling Counseling given: Not Answered  SDOH Screenings   Food Insecurity: No Food Insecurity (09/05/2024)  Housing: Unknown (09/05/2024)  Transportation Needs: No Transportation Needs (09/05/2024)  Utilities: Not At Risk (09/05/2024)  Alcohol Screen: Low Risk  (08/17/2023)   Depression (PHQ2-9): Low Risk  (09/05/2024)  Financial Resource Strain: Low Risk  (08/17/2023)  Physical Activity: Inactive (09/05/2024)  Social Connections: Socially Isolated (09/05/2024)  Stress: No Stress Concern Present (09/05/2024)  Tobacco Use: Low Risk  (09/05/2024)  Health Literacy: Inadequate Health Literacy (09/05/2024)   See flowsheets for full screening details  Depression Screen PHQ 2 & 9 Depression Scale- Over the past 2 weeks, how often have you been bothered by any of the following problems? Little interest or pleasure in doing things: 0 Feeling down, depressed, or hopeless (PHQ Adolescent also includes...irritable): 0 PHQ-2 Total Score: 0     Goals Addressed               This Visit's Progress     More exercise        COMPLETED: Patient Stated        No goals       COMPLETED: Stay alive (pt-stated)         Visit info / Clinical Intake: Medicare Wellness Visit Type:: Subsequent Annual Wellness Visit Persons participating in visit:: patient Medicare Wellness Visit Mode:: Telephone If telephone:: video declined Because this visit was a virtual/telehealth visit:: unable to obtan vitals due to lack of equipment If Telephone or Video please confirm:: I discussed the limitations of evaluation and management by telemedicine Patient Location:: home Provider Location:: office Information given by:: patient Interpreter Needed?: No Pre-visit prep was completed: yes AWV questionnaire completed by patient  prior to visit?: no Living arrangements:: lives with spouse/significant other Patient's Overall Health Status Rating: good Typical amount of pain: none Does pain affect daily life?: no Are you currently prescribed opioids?: no  Dietary Habits and Nutritional Risks How many meals a day?: 3 Eats fruit and vegetables daily?: (!) no Most meals are obtained by: -- (Jevity-tube feedings) In the last 2 weeks, have you had any of the following?: none Diabetic::  (!) yes Any non-healing wounds?: no How often do you check your BS?: 1 Would you like to be referred to a Nutritionist or for Diabetic Management? : no  Functional Status Activities of Daily Living (to include ambulation/medication): (!) Needs Assist Feeding: Independent Dressing/Grooming: Independent Bathing: Independent Toileting: Independent Transfer: Independent with device- listed below Home Assistive Devices/Equipment: Walker (specify Type) (rollater) Medication Administration: Independent Home Management: Needs assistance (comment) Manage your own finances?: (!) no Primary transportation is: family/friends (does not drive) Concerns about vision?: no *vision screening is required for WTM* Concerns about hearing?: no  Fall Screening Bright in the past year?: 0 Number of Bright in past year: 0 Was there an injury with Fall?: 0 Fall Risk Category Calculator: 0 Patient Fall Risk Level: Low Fall Risk  Fall Risk Patient at Risk for Bright Due to: Impaired mobility Fall risk Follow up: Education provided; Bright prevention discussed  Home and Transportation Safety: All rugs have non-skid backing?: N/A, no rugs All stairs or steps have railings?: yes Grab bars in the bathtub or shower?: yes Have non-skid surface in bathtub or shower?: yes Good home lighting?: yes Regular seat belt use?: yes Hospital stays in the last year:: no  Cognitive Assessment Difficulty concentrating, remembering, or making decisions? : no Will 6CIT or Mini Cog be Completed: yes What year is it?: 0 points What month is it?: 0 points Give patient an address phrase to remember (5 components): [address] [place]  About what time is it?: 0 points Count backwards from 20 to 1: 0 points Say the months of the year in reverse: 0 points Repeat the address phrase from earlier: 0 points 6 CIT Score: 0 points  Advance Directives (For Healthcare) Does Patient Have a Medical Advance Directive?:  No  Reviewed/Updated  Reviewed/Updated: Reviewed All (Medical, Surgical, Family, Medications, Allergies, Care Teams, Patient Goals)        Objective:    Today's Vitals   09/05/24 1013  Weight: 254 lb (115.2 kg)  Height: 6' (1.829 m)   Body mass index is 34.45 kg/m.  Current Medications (verified) Outpatient Encounter Medications as of 09/05/2024  Medication Sig   Accu-Chek FastClix Lancets MISC USE AS DIRECTED UP TO FOUR TIMES DAILY   acetaminophen  (TYLENOL ) 325 MG tablet 325-650 mg by Per G Tube route as needed.   aspirin  81 MG chewable tablet 81 mg by Per G Tube route daily.   atorvastatin  (LIPITOR) 40 MG tablet TAKE 1 TABLET (40 MG TOTAL) BY MOUTH AT BEDTIME. FOR CHOLESTEROL.   chlorhexidine  (PERIDEX ) 0.12 % solution Use as directed 15 mLs in the mouth or throat 2 (two) times daily.   famotidine  (PEPCID ) 20 MG tablet TAKE 1 TABLET BY MOUTH TWICE A DAY   finasteride  (PROSCAR ) 5 MG tablet PLACE ONE TABLET IN GI TUBE ONCE DAILY FOR URINE FLOW.   fluticasone  (FLONASE ) 50 MCG/ACT nasal spray PLACE 1 SPRAY INTO BOTH NOSTRILS 2 (TWO) TIMES DAILY AS NEEDED FOR ALLERGIES OR RHINITIS.   gabapentin  (NEURONTIN ) 250 MG/5ML solution Place 10 mLs (500 mg total) into feeding tube  2 (two) times daily AND 10 mLs (500 mg total) at bedtime.   glucose blood test strip Use to test blood sugar three times daily   glycopyrrolate  (ROBINUL ) 1 MG tablet PLACE 2 TABLETS (2 MG TOTAL) INTO FEEDING TUBE 3 (THREE) TIMES DAILY. FOR ORAL SECRETIONS.   guaifenesin  (ROBITUSSIN) 100 MG/5ML syrup PLACE 10 MLS (200 MG TOTAL) INTO FEEDING TUBE 4 (FOUR) TIMES DAILY - WITH MEALS AND AT BEDTIME.   mouth rinse LIQD solution 15 mLs by Mouth Rinse route 5 (five) times daily.   Nutritional Supplements (FEEDING SUPPLEMENT, OSMOLITE 1.5 CAL,) LIQD Place 325 mLs into feeding tube 4 (four) times daily.   Water  For Irrigation, Sterile (FREE WATER ) SOLN Place 200 mLs into feeding tube every 8 (eight) hours. Use filtered or bottled  water  (not distilled water )   No facility-administered encounter medications on file as of 09/05/2024.   Hearing/Vision screen Vision Screening - Comments:: UTD w/visits [place] Immunizations and Health Maintenance Health Maintenance  Topic Date Due   DTaP/Tdap/Td (1 - Tdap) Never done   Diabetic kidney evaluation - Urine ACR  08/23/2021   Zoster Vaccines- Shingrix (2 of 2) 10/02/2021   Influenza Vaccine  05/19/2024   COVID-19 Vaccine (7 - 2025-26 season) 06/19/2024   Medicare Annual Wellness (AWV)  08/16/2024   Diabetic kidney evaluation - eGFR measurement  10/13/2024   HEMOGLOBIN A1C  10/13/2024   FOOT EXAM  04/13/2025   OPHTHALMOLOGY EXAM  07/11/2025   Colonoscopy  10/24/2026   Pneumococcal Vaccine: 50+ Years  Completed   Hepatitis C Screening  Completed   Meningococcal B Vaccine  Aged Out        Assessment/Plan:  This is a routine wellness examination for Michael Bright.  Patient Care Team: Gretta Comer POUR, NP as PCP - General (Internal Medicine) Delayne Mirna CROME, DO (Otolaryngology) Love, Sharlet GORMAN RIGGERS as Physician Assistant (Physical Medicine and Rehabilitation) Camillo Golas, MD as Attending Physician (Ophthalmology) Marcey Elspeth PARAS, MD as Consulting Physician (Ophthalmology)  I have personally reviewed and noted the following in the patient's chart:   Medical and social history Use of alcohol, tobacco or illicit drugs  Current medications and supplements including opioid prescriptions. Functional ability and status Nutritional status Physical activity Advanced directives List of other physicians Hospitalizations, surgeries, and ER visits in previous 12 months Vitals Screenings to include cognitive, depression, and Bright Referrals and appointments  No orders of the defined types were placed in this encounter.  In addition, I have reviewed and discussed with patient certain preventive protocols, quality metrics, and best practice recommendations. A written  personalized care plan for preventive services as well as general preventive health recommendations were provided to patient.   Michael CROME Saris, LPN   [id number]/81/7974   AWV/CPE made for next year simultaneously  After Visit Summary: (MyChart) Due to this being a telephonic visit, the after visit summary with patients personalized plan was offered to patient via MyChart   Nurse Notes: HI! Pt/wife indicate that pt needs a NEW Rx for Jevity to be sent to [facility]. He will be out for after November.She relays he received 3-4 cans per day via Gtube. Please send mychart msg for any questions per wife.

## 2024-09-24 DIAGNOSIS — G4733 Obstructive sleep apnea (adult) (pediatric): Secondary | ICD-10-CM | POA: Diagnosis not present

## 2024-10-09 ENCOUNTER — Ambulatory Visit: Payer: Self-pay | Admitting: Primary Care

## 2024-10-09 ENCOUNTER — Ambulatory Visit: Admitting: Primary Care

## 2024-10-09 ENCOUNTER — Encounter: Payer: Self-pay | Admitting: Primary Care

## 2024-10-09 VITALS — BP 124/68 | HR 72 | Temp 98.1°F | Ht 72.0 in | Wt 267.0 lb

## 2024-10-09 DIAGNOSIS — R6889 Other general symptoms and signs: Secondary | ICD-10-CM

## 2024-10-09 DIAGNOSIS — Z931 Gastrostomy status: Secondary | ICD-10-CM

## 2024-10-09 DIAGNOSIS — R1312 Dysphagia, oropharyngeal phase: Secondary | ICD-10-CM | POA: Diagnosis not present

## 2024-10-09 DIAGNOSIS — I10 Essential (primary) hypertension: Secondary | ICD-10-CM

## 2024-10-09 DIAGNOSIS — R202 Paresthesia of skin: Secondary | ICD-10-CM | POA: Diagnosis not present

## 2024-10-09 DIAGNOSIS — K5900 Constipation, unspecified: Secondary | ICD-10-CM

## 2024-10-09 DIAGNOSIS — E1165 Type 2 diabetes mellitus with hyperglycemia: Secondary | ICD-10-CM | POA: Diagnosis not present

## 2024-10-09 DIAGNOSIS — G4733 Obstructive sleep apnea (adult) (pediatric): Secondary | ICD-10-CM

## 2024-10-09 DIAGNOSIS — R39198 Other difficulties with micturition: Secondary | ICD-10-CM | POA: Diagnosis not present

## 2024-10-09 DIAGNOSIS — E785 Hyperlipidemia, unspecified: Secondary | ICD-10-CM | POA: Diagnosis not present

## 2024-10-09 DIAGNOSIS — Z125 Encounter for screening for malignant neoplasm of prostate: Secondary | ICD-10-CM

## 2024-10-09 LAB — COMPREHENSIVE METABOLIC PANEL WITH GFR
ALT: 14 U/L (ref 3–53)
AST: 17 U/L (ref 5–37)
Albumin: 4 g/dL (ref 3.5–5.2)
Alkaline Phosphatase: 77 U/L (ref 39–117)
BUN: 10 mg/dL (ref 6–23)
CO2: 30 meq/L (ref 19–32)
Calcium: 8.7 mg/dL (ref 8.4–10.5)
Chloride: 104 meq/L (ref 96–112)
Creatinine, Ser: 0.64 mg/dL (ref 0.40–1.50)
GFR: 98.08 mL/min
Glucose, Bld: 82 mg/dL (ref 70–99)
Potassium: 4.4 meq/L (ref 3.5–5.1)
Sodium: 139 meq/L (ref 135–145)
Total Bilirubin: 0.6 mg/dL (ref 0.2–1.2)
Total Protein: 7.2 g/dL (ref 6.0–8.3)

## 2024-10-09 LAB — LIPID PANEL
Cholesterol: 107 mg/dL (ref 28–200)
HDL: 55.7 mg/dL
LDL Cholesterol: 44 mg/dL (ref 10–99)
NonHDL: 51.26
Total CHOL/HDL Ratio: 2
Triglycerides: 36 mg/dL (ref 10.0–149.0)
VLDL: 7.2 mg/dL (ref 0.0–40.0)

## 2024-10-09 LAB — HEMOGLOBIN A1C: Hgb A1c MFr Bld: 5.7 % (ref 4.6–6.5)

## 2024-10-09 LAB — MICROALBUMIN / CREATININE URINE RATIO
Creatinine,U: 55 mg/dL
Microalb Creat Ratio: 41.9 mg/g — ABNORMAL HIGH (ref 0.0–30.0)
Microalb, Ur: 2.3 mg/dL — ABNORMAL HIGH (ref 0.7–1.9)

## 2024-10-09 LAB — PSA: PSA: 0.79 ng/mL (ref 0.10–4.00)

## 2024-10-09 MED ORDER — PREDNISONE 20 MG PO TABS: ORAL_TABLET | ORAL | 0 refills | Status: AC

## 2024-10-09 NOTE — Assessment & Plan Note (Signed)
 Follows with neurology  Continue CPAP nightly

## 2024-10-09 NOTE — Assessment & Plan Note (Signed)
 Stable.  Continue tube feedings. Will refill feeding supplement per patient request

## 2024-10-09 NOTE — Assessment & Plan Note (Signed)
 Repeat A1C pending. Remain off treatment.  Urine microalbumin due and pending.

## 2024-10-09 NOTE — Assessment & Plan Note (Signed)
 Stable.       - Continue to monitor

## 2024-10-09 NOTE — Patient Instructions (Signed)
 Start prednisone  20 mg tablets. Take 3 tablets by mouth every morning for 3 days, then 2 tablets for 3 days, then 1 tablet for 3 days.  Stop by the lab prior to leaving today. I will notify you of your results once received.   It was a pleasure to see you today!

## 2024-10-09 NOTE — Assessment & Plan Note (Signed)
 Repeat lipid panel pending. Continue atorvastatin 40 mg daily.

## 2024-10-09 NOTE — Assessment & Plan Note (Signed)
 Stable.  Continue glycopyrrolate  2 mg TID.

## 2024-10-09 NOTE — Assessment & Plan Note (Signed)
 Reviewed cervical spine plain films from July 2025 with patient and his wife today. Discussed options for treatment.   Will trial course of prednisone  via PEG tube.  Start prednisone  20 mg tablets. Take 3 tablets via PEG tube every morning for 3 days, then 2 tablets for 3 days, then 1 tablet for 3 days.  If he finds this helpful, then consider neurosurgery referral for injections. He will update.

## 2024-10-09 NOTE — Assessment & Plan Note (Signed)
 Controlled.  Continue finasteride 5 mg daily.

## 2024-10-09 NOTE — Assessment & Plan Note (Signed)
 Continue nutritional supplement. Refills provided.

## 2024-10-09 NOTE — Assessment & Plan Note (Signed)
Controlled.  Continue to monitor. Remain off treatment.

## 2024-10-09 NOTE — Progress Notes (Signed)
 "  Subjective:    Patient ID: Michael Bright, male    DOB: 04-06-1956, 68 y.o.   MRN: [id number]  Michael Bright is a very pleasant 68 y.o. male with a history of CVA, dysphagia, chronic right upper extremity paresthesias, type 2 diabetes who presents today for follow up of chronic conditions.  1) Paresthesias/Neck Pain: He continues to experience chronic right upper extremity paresthesias with a cold sensation. He does experience right lateral neck and shoulder pain.  He underwent plain films of the cervical spine in July 2025 which showed moderate degenerative disc disease to the lower spine.  At the time, he was offered steroid treatment but did not review results/comments via MyChart.  Today he he is agreeable to prednisone  trial.  Follows with physical medicine who increased his gabapentin  to 500 mg 3 times daily.  He has not noticed much improvement except for drowsiness.  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 He is needing a refill of his nutritional feeding supplement, Jevity 1.2 cal sent to [facility]. Doing well overall. He continues to feel well managed on glycopyrrolate  2 mg TID for secretions.   3) Type 2 Diabetes: Diet controlled and not currently on medication. Due for A1C today.  Immunizations: -Influenza: Completed this season  -Shingles: Completed Shingrix doses x 2  -Pneumonia: Completed last in 2023  Diet: Fair diet.  Exercise: No regular exercise.  Eye exam: Completes annually  Dental exam: Completes semi-annually    Colonoscopy: Completed in 2023, due 2028  PSA: Due   BP Readings from Last 3 Encounters:  10/09/24 124/68  07/05/24 131/72  05/03/24 (!) 121/59        Review of Systems  Constitutional:  Negative for unexpected weight change.  HENT:  Negative for rhinorrhea.   Respiratory:  Negative for cough and shortness of breath.   Cardiovascular:  Negative for chest pain.  Gastrointestinal:   Negative for constipation and diarrhea.  Genitourinary:  Negative for difficulty urinating.  Musculoskeletal:  Positive for arthralgias and neck pain.  Skin:  Negative for rash.  Allergic/Immunologic: Negative for environmental allergies.  Neurological:  Positive for numbness. Negative for dizziness and headaches.  Psychiatric/Behavioral:  The patient is not nervous/anxious.          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 06/24/2022   Influenza A 10/14/2023   Lower extremity edema 11/24/2019   Nail, injury by, initial encounter 06/24/2022   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 (CVA) 08/09/2019   Pneumonia    aspiration pneumonia   Renal insufficiency 05/30/2019   Sepsis with acute renal failure without septic shock (HCC)    Septic shock (HCC)    Status post insertion of percutaneous endoscopic gastrostomy (PEG) tube (HCC)    Stroke (HCC)    Urinary tract infection    Urinary tract infection without hematuria    Wound of right leg 02/08/2022    Social History   Socioeconomic History   Marital status: Married    Spouse name: Not on file   Number of children: Not on file   Years of education: Not on file   Highest education level: Not  on file  Occupational History   Not on file  Tobacco Use   Smoking status: Never   Smokeless tobacco: Never  Vaping Use   Vaping status: Never Used  Substance and Sexual Activity   Alcohol use: Not Currently   Drug use: No   Sexual activity: Yes  Other Topics Concern   Not on file  Social History Narrative   Not on file   Social Drivers of Health   Tobacco Use: Low Risk (10/09/2024)   Patient History    Smoking Tobacco Use: Never     Smokeless Tobacco Use: Never    Passive Exposure: Not on file  Financial Resource Strain: Low Risk (08/17/2023)   Overall Financial Resource Strain (CARDIA)    Difficulty of Paying Living Expenses: Not hard at all  Food Insecurity: No Food Insecurity (09/05/2024)   Epic    Worried About Programme Researcher, Broadcasting/film/video in the Last Year: Never true    Ran Out of Food in the Last Year: Never true  Transportation Needs: No Transportation Needs (09/05/2024)   Epic    Lack of Transportation (Medical): No    Lack of Transportation (Non-Medical): No  Physical Activity: Inactive (09/05/2024)   Exercise Vital Sign    Days of Exercise per Week: 0 days    Minutes of Exercise per Session: 0 min  Stress: No Stress Concern Present (09/05/2024)   [employer] of Occupational Health - Occupational Stress Questionnaire    Feeling of Stress: Not at all  Social Connections: Socially Isolated (09/05/2024)   Social Connection and Isolation Panel    Frequency of Communication with Friends and Family: Twice a week    Frequency of Social Gatherings with Friends and Family: Never    Attends Religious Services: Never    Database Administrator or Organizations: No    Attends Banker Meetings: Never    Marital Status: Married  Catering Manager Violence: Not At Risk (09/05/2024)   Epic    Fear of Current or Ex-Partner: No    Emotionally Abused: No    Physically Abused: No    Sexually Abused: No  Depression (PHQ2-9): Low Risk (09/05/2024)   Depression (PHQ2-9)    PHQ-2 Score: 0  Alcohol Screen: Low Risk (08/17/2023)   Alcohol Screen    Last Alcohol Screening Score (AUDIT): 0  Housing: Unknown (09/05/2024)   Epic    Unable to Pay for Housing in the Last Year: No    Number of Times Moved in the Last Year: Not on file    Homeless in the Last Year: No  Utilities: Not At Risk (09/05/2024)   Epic    Threatened with loss of utilities: No  Health Literacy: Inadequate Health Literacy (09/05/2024)    B1300 Health Literacy    Frequency of need for help with medical instructions: Often    Past Surgical History:  Procedure Laterality Date   CHOLECYSTECTOMY     IR GASTROSTOMY TUBE MOD SED  07/04/2019   IR GASTROSTOMY TUBE MOD SED  10/08/2022   IR REPLC GASTRO/COLONIC TUBE PERCUT W/FLUORO  12/11/2021   IR REPLC GASTRO/COLONIC TUBE PERCUT W/FLUORO  10/02/2022   IR REPLC GASTRO/COLONIC TUBE PERCUT W/FLUORO  03/25/2023   IR REPLC GASTRO/COLONIC TUBE PERCUT W/FLUORO  07/17/2024    Family History  Adopted: Yes    Allergies[1]  Medications Ordered Prior to Encounter[2]  BP 124/68 (BP Location: Left Arm, Patient Position: Sitting, Cuff Size: Large)   Pulse 72   Temp 98.1 F (  36.7 C) (Temporal)   Ht 6' (1.829 m)   Wt 267 lb (121.1 kg)   SpO2 96%   BMI 36.21 kg/m  Objective:   Physical Exam HENT:     Right Ear: Tympanic membrane and ear canal normal.     Left Ear: Tympanic membrane and ear canal normal.  Eyes:     Pupils: Pupils are equal, round, and reactive to light.  Cardiovascular:     Rate and Rhythm: Normal rate and regular rhythm.  Pulmonary:     Effort: Pulmonary effort is normal.     Breath sounds: Normal breath sounds.  Abdominal:     General: Bowel sounds are normal.     Palpations: Abdomen is soft.     Tenderness: There is no abdominal tenderness.  Musculoskeletal:        General: Normal range of motion.     Cervical back: Neck supple.  Skin:    General: Skin is warm and dry.  Neurological:     Mental Status: He is alert and oriented to person, place, and time.     Cranial Nerves: No cranial nerve deficit.     Deep Tendon Reflexes:     Reflex Scores:      Patellar reflexes are 2+ on the right side and 2+ on the left side. Psychiatric:        Mood and Affect: Mood normal.     Physical Exam : Just slight stress and I thought stop complaining you are 21       Assessment & Plan:  Paresthesias Assessment & Plan: Reviewed cervical spine plain films from  July 2025 with patient and his wife today. Discussed options for treatment.   Will trial course of prednisone  via PEG tube.  Start prednisone  20 mg tablets. Take 3 tablets via PEG tube every morning for 3 days, then 2 tablets for 3 days, then 1 tablet for 3 days.  If he finds this helpful, then consider neurosurgery referral for injections. He will update.    Orders: -     predniSONE ; Take 3 tablets by mouth daily x 3 days, then 2 tablets x 3 days, then 1 tablet for 3 days.  Dispense: 18 tablet; Refill: 0  Essential hypertension Assessment & Plan: Controlled.   Continue to monitor.  Remain off treatment    OSA on CPAP Assessment & Plan: Follows with neurology  Continue CPAP nightly   Oropharyngeal dysphagia Assessment & Plan: Stable.  Continue tube feedings. Will refill feeding supplement per patient request   Controlled type 2 diabetes mellitus with hyperglycemia, without long-term current use of insulin  (HCC) -     Hemoglobin A1c -     Comprehensive metabolic panel with GFR -     Microalbumin / creatinine urine ratio  Screening for prostate cancer -     PSA  Hyperlipidemia, unspecified hyperlipidemia type Assessment & Plan: Repeat lipid panel pending.  Continue atorvastatin  40 mg daily.   Orders: -     Lipid panel -     Comprehensive metabolic panel with GFR  Copious oral secretions Assessment & Plan: Stable.  Continue glycopyrrolate  2 mg TID.   Controlled type 2 diabetes mellitus with hyperglycemia (HCC) Assessment & Plan: Repeat A1C pending. Remain off treatment.  Urine microalbumin due and pending.   Feeding by G-tube [facility]) Assessment & Plan: Continue nutritional supplement. Refills provided.    Difficulty urinating Assessment & Plan: Controlled.  Continue finasteride  5 mg daily.   Constipation, unspecified constipation type Assessment &  Plan: Stable.  Continue to monitor.     Assessment and Plan Assessment &  Plan         Comer MARLA Gaskins, NP       [1] No Known Allergies [2]  Current Outpatient Medications on File Prior to Visit  Medication Sig Dispense Refill   Accu-Chek FastClix Lancets MISC USE AS DIRECTED UP TO FOUR TIMES DAILY 102 each 5   acetaminophen  (TYLENOL ) 325 MG tablet 325-650 mg by Per G Tube route as needed.     aspirin  81 MG chewable tablet 81 mg by Per G Tube route daily.     atorvastatin  (LIPITOR) 40 MG tablet TAKE 1 TABLET (40 MG TOTAL) BY MOUTH AT BEDTIME. FOR CHOLESTEROL. 90 tablet 0   chlorhexidine  (PERIDEX ) 0.12 % solution Use as directed 15 mLs in the mouth or throat 2 (two) times daily. 473 mL 6   famotidine  (PEPCID ) 20 MG tablet TAKE 1 TABLET BY MOUTH TWICE A DAY 180 tablet 3   finasteride  (PROSCAR ) 5 MG tablet PLACE ONE TABLET IN GI TUBE ONCE DAILY FOR URINE FLOW. 90 tablet 0   fluticasone  (FLONASE ) 50 MCG/ACT nasal spray PLACE 1 SPRAY INTO BOTH NOSTRILS 2 (TWO) TIMES DAILY AS NEEDED FOR ALLERGIES OR RHINITIS. 16 mL 2   gabapentin  (NEURONTIN ) 250 MG/5ML solution Place 10 mLs (500 mg total) into feeding tube 2 (two) times daily AND 10 mLs (500 mg total) at bedtime. 900 mL 11   glucose blood test strip Use to test blood sugar three times daily 300 each 3   glycopyrrolate  (ROBINUL ) 1 MG tablet PLACE 2 TABLETS (2 MG TOTAL) INTO FEEDING TUBE 3 (THREE) TIMES DAILY. FOR ORAL SECRETIONS. 540 tablet 0   guaifenesin  (ROBITUSSIN) 100 MG/5ML syrup PLACE 10 MLS (200 MG TOTAL) INTO FEEDING TUBE 4 (FOUR) TIMES DAILY - WITH MEALS AND AT BEDTIME. 948 mL 0   mouth rinse LIQD solution 15 mLs by Mouth Rinse route 5 (five) times daily. 946 mL 1   Nutritional Supplements (FEEDING SUPPLEMENT, OSMOLITE 1.5 CAL,) LIQD Place 325 mLs into feeding tube 4 (four) times daily.  0   Water  For Irrigation, Sterile (FREE WATER ) SOLN Place 200 mLs into feeding tube every 8 (eight) hours. Use filtered or bottled water  (not distilled water )     No current facility-administered medications on file  prior to visit.   "

## 2024-10-10 DIAGNOSIS — H35033 Hypertensive retinopathy, bilateral: Secondary | ICD-10-CM | POA: Diagnosis not present

## 2024-10-10 DIAGNOSIS — H43823 Vitreomacular adhesion, bilateral: Secondary | ICD-10-CM | POA: Diagnosis not present

## 2024-10-10 DIAGNOSIS — Z961 Presence of intraocular lens: Secondary | ICD-10-CM | POA: Diagnosis not present

## 2024-10-10 DIAGNOSIS — E113413 Type 2 diabetes mellitus with severe nonproliferative diabetic retinopathy with macular edema, bilateral: Secondary | ICD-10-CM | POA: Diagnosis not present

## 2024-10-10 LAB — OPHTHALMOLOGY REPORT-SCANNED

## 2024-10-13 ENCOUNTER — Encounter: Admitting: Primary Care

## 2024-10-16 ENCOUNTER — Other Ambulatory Visit

## 2024-10-16 ENCOUNTER — Other Ambulatory Visit: Payer: Self-pay | Admitting: Primary Care

## 2024-10-16 DIAGNOSIS — E1165 Type 2 diabetes mellitus with hyperglycemia: Secondary | ICD-10-CM | POA: Diagnosis not present

## 2024-10-16 DIAGNOSIS — R202 Paresthesia of skin: Secondary | ICD-10-CM

## 2024-10-16 LAB — MICROALBUMIN / CREATININE URINE RATIO
Creatinine,U: 51.3 mg/dL
Microalb Creat Ratio: 24.2 mg/g (ref 0.0–30.0)
Microalb, Ur: 1.2 mg/dL (ref 0.7–1.9)

## 2024-10-17 ENCOUNTER — Ambulatory Visit: Payer: Self-pay | Admitting: Primary Care

## 2024-10-17 DIAGNOSIS — R202 Paresthesia of skin: Secondary | ICD-10-CM

## 2024-10-18 MED ORDER — MELOXICAM 15 MG PO TABS: 15.0000 mg | ORAL_TABLET | Freq: Every day | ORAL | 0 refills | Status: AC | PRN

## 2024-10-31 ENCOUNTER — Telehealth: Payer: Self-pay | Admitting: Primary Care

## 2024-10-31 NOTE — Telephone Encounter (Signed)
 Please call patient's wife:  The company for which we sent the prescription for his Jevity 1.2 cal nutritional supplement has sent us  a fax stating that this is a change from his current nutritional supplement.  Because of this they cannot process the order.  What do they have at home? I was not aware that this was a change from his usual.

## 2024-11-01 NOTE — Telephone Encounter (Signed)
 LM for pt's wife to return my call

## 2024-11-04 ENCOUNTER — Other Ambulatory Visit: Payer: Self-pay | Admitting: Physical Medicine & Rehabilitation

## 2024-11-04 DIAGNOSIS — E1142 Type 2 diabetes mellitus with diabetic polyneuropathy: Secondary | ICD-10-CM

## 2024-11-05 NOTE — Telephone Encounter (Signed)
Can we try calling again?

## 2024-11-06 NOTE — Telephone Encounter (Signed)
 Noted.

## 2024-11-06 NOTE — Telephone Encounter (Signed)
 Spoke with patients wife and she stated that Jevity 1.2 cal is what they have and stated that they were able to receive his shipment already. Nothing further needed.

## 2024-11-10 NOTE — Telephone Encounter (Signed)
 Requested Prescriptions   Pending Prescriptions Disp Refills   gabapentin  (NEURONTIN ) 250 MG/5ML solution [Pharmacy Med Name: GABAPENTIN  250 MG/5 ML SOLN] 150 mL 6    Sig: TAKE 2.5-5 MLS (125-250 MG TOTAL) BY MOUTH AT BEDTIME. FOR PAIN.     Date of patient request: 11/04/2024 Last office visit: 07/05/2024 Upcoming visit: 07/04/2025 Date of last refill: 07/05/2024 Last refill amount: #900 ml, 11 refills

## 2024-11-16 ENCOUNTER — Ambulatory Visit (INDEPENDENT_AMBULATORY_CARE_PROVIDER_SITE_OTHER): Admitting: Podiatry

## 2024-11-16 ENCOUNTER — Encounter: Payer: Self-pay | Admitting: Podiatry

## 2024-11-16 DIAGNOSIS — M2010 Hallux valgus (acquired), unspecified foot: Secondary | ICD-10-CM | POA: Diagnosis not present

## 2024-11-16 DIAGNOSIS — M79674 Pain in right toe(s): Secondary | ICD-10-CM | POA: Diagnosis not present

## 2024-11-16 DIAGNOSIS — B351 Tinea unguium: Secondary | ICD-10-CM

## 2024-11-16 DIAGNOSIS — E1165 Type 2 diabetes mellitus with hyperglycemia: Secondary | ICD-10-CM | POA: Diagnosis not present

## 2024-11-16 DIAGNOSIS — M79675 Pain in left toe(s): Secondary | ICD-10-CM

## 2024-11-16 NOTE — Progress Notes (Signed)
 This patient presents  to my office for at risk foot care.  This patient requires this care by a professional since this patient will be at risk due to having diabetes,CVA and renal insufficiency.  This patient is unable to cut nails himself since the patient cannot reach his nails.These nails are painful walking and wearing shoes. He presents to the office with caregiver. This patient presents for at risk foot care today.  General Appearance  Alert, conversant and in no acute stress.  Vascular  Dorsalis pedis are weakly  palpable  bilaterally. Posterior tibial pulses are absent due to swelling  Bilateral.  Capillary return is within normal limits  bilaterally. Cold feet  bilaterally.  Neurologic  Senn-Weinstein monofilament wire test diminished right foot. LOPS WNL left foot. Muscle power within normal limits bilaterally.  Nails Thick disfigured discolored nails with subungual debris  from hallux to fifth toes bilaterally. No evidence of bacterial infection or drainage bilaterally.   Orthopedic  No limitations of motion  feet .  No crepitus or effusions noted.  HAV  B/L.  Skin  normotropic skin with no porokeratosis noted bilaterally.  No signs of infections or ulcers noted.     Onychomycosis  Pain in right toes  Pain in left toes  Injury right hallux nail plate.  Consent was obtained for treatment procedures.   Mechanical debridement of nails 1-5  bilaterally performed with a nail nipper.  Filed with dremel without incident.    Return office visit   3  months                  Told patient to return for periodic foot care and evaluation due to potential at risk complications.   Cordella Bold DPM

## 2024-11-19 ENCOUNTER — Other Ambulatory Visit: Payer: Self-pay | Admitting: Primary Care

## 2024-11-19 DIAGNOSIS — E785 Hyperlipidemia, unspecified: Secondary | ICD-10-CM

## 2025-02-15 ENCOUNTER — Ambulatory Visit: Admitting: Podiatry

## 2025-04-10 ENCOUNTER — Ambulatory Visit: Admitting: Primary Care

## 2025-05-10 ENCOUNTER — Ambulatory Visit: Admitting: Adult Health

## 2025-07-04 ENCOUNTER — Ambulatory Visit: Admitting: Physical Medicine & Rehabilitation

## 2025-10-16 ENCOUNTER — Encounter: Admitting: Primary Care

## 2025-10-16 ENCOUNTER — Ambulatory Visit
